# Patient Record
Sex: Male | Born: 1970
Health system: Southern US, Community
[De-identification: ages and names within clinical notes are randomized; demographics above are authoritative.]

## PROBLEM LIST (undated history)

## (undated) DIAGNOSIS — I509 Heart failure, unspecified: Secondary | ICD-10-CM

## (undated) DIAGNOSIS — E119 Type 2 diabetes mellitus without complications: Secondary | ICD-10-CM

---

## 1997-11-30 ENCOUNTER — Encounter: Admission: RE | Admit: 1997-11-30 | Discharge: 1997-11-30 | Payer: Self-pay | Admitting: *Deleted

## 2005-11-05 ENCOUNTER — Emergency Department (HOSPITAL_COMMUNITY): Admission: EM | Admit: 2005-11-05 | Discharge: 2005-11-05 | Payer: Self-pay | Admitting: Emergency Medicine

## 2016-11-06 ENCOUNTER — Inpatient Hospital Stay (HOSPITAL_COMMUNITY)
Admission: EM | Admit: 2016-11-06 | Discharge: 2016-11-16 | DRG: 287 | Disposition: A | Discharge status: Home / Self Care | Payer: Medicaid Other | Attending: Internal Medicine | Admitting: Internal Medicine

## 2016-11-06 ENCOUNTER — Emergency Department (HOSPITAL_COMMUNITY): Payer: Medicaid Other

## 2016-11-06 ENCOUNTER — Encounter (HOSPITAL_COMMUNITY): Payer: Self-pay | Admitting: Internal Medicine

## 2016-11-06 DIAGNOSIS — I240 Acute coronary thrombosis not resulting in myocardial infarction: Secondary | ICD-10-CM | POA: Diagnosis present

## 2016-11-06 DIAGNOSIS — E1165 Type 2 diabetes mellitus with hyperglycemia: Secondary | ICD-10-CM | POA: Diagnosis present

## 2016-11-06 DIAGNOSIS — Z8249 Family history of ischemic heart disease and other diseases of the circulatory system: Secondary | ICD-10-CM

## 2016-11-06 DIAGNOSIS — N179 Acute kidney failure, unspecified: Secondary | ICD-10-CM | POA: Diagnosis present

## 2016-11-06 DIAGNOSIS — R609 Edema, unspecified: Secondary | ICD-10-CM

## 2016-11-06 DIAGNOSIS — E039 Hypothyroidism, unspecified: Secondary | ICD-10-CM | POA: Diagnosis present

## 2016-11-06 DIAGNOSIS — E1122 Type 2 diabetes mellitus with diabetic chronic kidney disease: Secondary | ICD-10-CM | POA: Diagnosis present

## 2016-11-06 DIAGNOSIS — F17290 Nicotine dependence, other tobacco product, uncomplicated: Secondary | ICD-10-CM | POA: Diagnosis present

## 2016-11-06 DIAGNOSIS — N182 Chronic kidney disease, stage 2 (mild): Secondary | ICD-10-CM | POA: Diagnosis present

## 2016-11-06 DIAGNOSIS — E876 Hypokalemia: Secondary | ICD-10-CM | POA: Diagnosis not present

## 2016-11-06 DIAGNOSIS — R0602 Shortness of breath: Secondary | ICD-10-CM | POA: Diagnosis present

## 2016-11-06 DIAGNOSIS — I5023 Acute on chronic systolic (congestive) heart failure: Secondary | ICD-10-CM

## 2016-11-06 DIAGNOSIS — I5021 Acute systolic (congestive) heart failure: Principal | ICD-10-CM | POA: Diagnosis present

## 2016-11-06 DIAGNOSIS — I514 Myocarditis, unspecified: Secondary | ICD-10-CM | POA: Diagnosis present

## 2016-11-06 DIAGNOSIS — T380X5A Adverse effect of glucocorticoids and synthetic analogues, initial encounter: Secondary | ICD-10-CM | POA: Diagnosis not present

## 2016-11-06 DIAGNOSIS — I509 Heart failure, unspecified: Secondary | ICD-10-CM

## 2016-11-06 DIAGNOSIS — I5020 Unspecified systolic (congestive) heart failure: Secondary | ICD-10-CM

## 2016-11-06 LAB — BRAIN NATRIURETIC PEPTIDE: B Natriuretic Peptide: 961.6 pg/mL — ABNORMAL HIGH (ref 0.0–100.0)

## 2016-11-06 LAB — I-STAT TROPONIN, ED: Troponin i, poc: 0.04 ng/mL (ref 0.00–0.08)

## 2016-11-06 LAB — CBC
HCT: 41.2 % (ref 39.0–52.0)
Hemoglobin: 13.9 g/dL (ref 13.0–17.0)
MCH: 29.1 pg (ref 26.0–34.0)
MCHC: 33.7 g/dL (ref 30.0–36.0)
MCV: 86.4 fL (ref 78.0–100.0)
Platelets: 369 10*3/uL (ref 150–400)
RBC: 4.77 MIL/uL (ref 4.22–5.81)
RDW: 15.5 % (ref 11.5–15.5)
WBC: 16 10*3/uL — ABNORMAL HIGH (ref 4.0–10.5)

## 2016-11-06 LAB — BASIC METABOLIC PANEL
Anion gap: 9 (ref 5–15)
BUN: 37 mg/dL — ABNORMAL HIGH (ref 6–20)
CO2: 21 mmol/L — ABNORMAL LOW (ref 22–32)
Calcium: 8 mg/dL — ABNORMAL LOW (ref 8.9–10.3)
Chloride: 100 mmol/L — ABNORMAL LOW (ref 101–111)
Creatinine, Ser: 1.43 mg/dL — ABNORMAL HIGH (ref 0.61–1.24)
GFR calc Af Amer: >60 mL/min (ref >60)
GFR calc non Af Amer: 57 mL/min — ABNORMAL LOW (ref >60)
Glucose, Bld: 158 mg/dL — ABNORMAL HIGH (ref 65–99)
Potassium: 4.8 mmol/L (ref 3.5–5.1)
Sodium: 130 mmol/L — ABNORMAL LOW (ref 135–145)

## 2016-11-06 LAB — TSH: TSH: 8.439 u[IU]/mL — ABNORMAL HIGH (ref 0.350–4.500)

## 2016-11-06 MED ORDER — SODIUM CHLORIDE 0.9 % IV SOLN: 250.0000 mL | INTRAVENOUS | Status: DC | PRN

## 2016-11-06 MED ORDER — DEXTROSE 5 % IV SOLN
1.0000 g | Freq: Once | INTRAVENOUS | Status: AC
  Administered 2016-11-06: 1 g via INTRAVENOUS
  Filled 2016-11-06: qty 10

## 2016-11-06 MED ORDER — ASPIRIN EC 81 MG PO TBEC
81.0000 mg | DELAYED_RELEASE_TABLET | Freq: Every day | ORAL | Status: DC
  Administered 2016-11-06 – 2016-11-16 (×11): 81 mg via ORAL
  Filled 2016-11-06 (×12): qty 1

## 2016-11-06 MED ORDER — SODIUM CHLORIDE 0.9% FLUSH: 3.0000 mL | INTRAVENOUS | Status: DC | PRN

## 2016-11-06 MED ORDER — DEXTROSE 5 % IV SOLN
500.0000 mg | Freq: Once | INTRAVENOUS | Status: AC
  Administered 2016-11-06: 500 mg via INTRAVENOUS
  Filled 2016-11-06: qty 500

## 2016-11-06 MED ORDER — FUROSEMIDE 10 MG/ML IJ SOLN
40.0000 mg | Freq: Once | INTRAMUSCULAR | Status: AC
  Administered 2016-11-06: 40 mg via INTRAVENOUS
  Filled 2016-11-06: qty 4

## 2016-11-06 MED ORDER — ACETAMINOPHEN 325 MG PO TABS: 650.0000 mg | ORAL_TABLET | ORAL | Status: DC | PRN

## 2016-11-06 MED ORDER — ONDANSETRON HCL 4 MG/2ML IJ SOLN: 4.0000 mg | Freq: Four times a day (QID) | INTRAMUSCULAR | Status: DC | PRN

## 2016-11-06 MED ORDER — HEPARIN SODIUM (PORCINE) 5000 UNIT/ML IJ SOLN
5000.0000 [IU] | Freq: Three times a day (TID) | INTRAMUSCULAR | Status: DC
  Administered 2016-11-06 – 2016-11-07 (×2): 5000 [IU] via SUBCUTANEOUS
  Filled 2016-11-06 (×2): qty 1

## 2016-11-06 MED ORDER — ALBUTEROL SULFATE (2.5 MG/3ML) 0.083% IN NEBU
2.5000 mg | INHALATION_SOLUTION | Freq: Once | RESPIRATORY_TRACT | Status: AC
  Administered 2016-11-06: 2.5 mg via RESPIRATORY_TRACT
  Filled 2016-11-06: qty 3

## 2016-11-06 MED ORDER — SODIUM CHLORIDE 0.9% FLUSH
3.0000 mL | Freq: Two times a day (BID) | INTRAVENOUS | Status: DC
  Administered 2016-11-06 – 2016-11-11 (×5): 3 mL via INTRAVENOUS

## 2016-11-06 NOTE — ED Notes (Signed)
Nurse collecting labs. 

## 2016-11-06 NOTE — ED Notes (Signed)
Talked to admitting about pt rr and how pt was doing. Admitting talk to EDP about what the plan was.

## 2016-11-06 NOTE — ED Notes (Signed)
Ambulated pt on the unit pt ambulated well pt o2 sats 91% HR-125 no complaints noted at this time

## 2016-11-06 NOTE — ED Notes (Signed)
Patient transported to X-ray 

## 2016-11-06 NOTE — Consult Note (Signed)
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   (Not in a hospital admission)   . furosemide  40 mg Intravenous Once    Infusions: . azithromycin    . cefTRIAXone (ROCEPHIN)  IV      Allergies not on file  Social History   Social History  . Marital status: Unknown    Spouse name: N/A  . Number of children: N/A  . Years of education: N/A   Occupational History  . Not on file.   Social History Main Topics  . Smoking status: Not on file  . Smokeless tobacco: Not on file  . Alcohol use Not on file  . Drug use: Unknown  . Sexual activity: Not on file   Other Topics Concern  . Not on file   Social History Narrative  . No narrative on file    No family history on file.  REVIEW OF SYSTEMS:  All systems reviewed  Negative to the above problem except as noted above.    PHYSICAL EXAM: Vitals:   11/06/16 1615 11/06/16 1621  BP: (!) 119/102   Resp: (!) 29 (!) 30  Temp:  98.2 F (36.8 C)  SpO2:  100%    No intake or output data in the 24 hours ending 11/06/16 1741  General:  Well appearing. No respiratory difficulty HEENT: normal  Sclera are icteriic Neck: supple. JVP is increased  . Carotids 2+ bilat; no bruits. No lymphadenopathy or thryomegaly appreciated. Cor: PMI nondisplaced. Regular rate & rhythm.  S1,S2  S3  PMI diffuse  RV impulse No singificant murmurs  Lungs: Rales at bases R greater than L   Abdomen: soft, nontender, nondistended. No hepatosplenomegaly. No bruits or masses. Good  bowel sounds. Extremities: no cyanosis, clubbing, rash, 2-3+ edema with weeping  Neuro: alert & oriented x 3, cranial nerves grossly intact. moves all 4 extremities w/o difficulty. Affect pleasant.  ECG:  Sinus tachycardia  117 bpm  NOnspecific ST  T wave changes    Results for orders placed or performed during the hospital encounter of 11/06/16 (from the past 24 hour(s))  Basic metabolic panel     Status: Abnormal   Collection Time: 11/06/16  4:15 PM  Result Value Ref Range   Sodium 130 (L) 135 - 145 mmol/L   Potassium 4.8 3.5 - 5.1 mmol/L   Chloride 100 (L) 101 - 111 mmol/L   CO2 21 (L) 22 - 32 mmol/L   Glucose, Bld 158 (H) 65 - 99 mg/dL   BUN 37 (H) 6 - 20 mg/dL   Creatinine, Ser 1.611.43 (H) 0.61 - 1.24 mg/dL   Calcium 8.0 (L) 8.9 - 10.3 mg/dL   GFR calc non Af Amer 57 (L) >60 mL/min   GFR calc Af Amer >60 >60 mL/min   Anion gap 9 5 - 15  CBC     Status: Abnormal  Collection Time: 11/06/16  4:15 PM  Result Value Ref Range   WBC 16.0 (H) 4.0 - 10.5 K/uL   RBC 4.77 4.22 - 5.81 MIL/uL   Hemoglobin 13.9 13.0 - 17.0 g/dL   HCT 54.6 27.0 - 35.0 %   MCV 86.4 78.0 - 100.0 fL   MCH 29.1 26.0 - 34.0 pg   MCHC 33.7 30.0 - 36.0 g/dL   RDW 09.3 81.8 - 29.9 %   Platelets 369 150 - 400 K/uL  Brain natriuretic peptide     Status: Abnormal   Collection Time: 11/06/16  4:21 PM  Result Value Ref Range   B Natriuretic Peptide 961.6 (H) 0.0 - 100.0 pg/mL  I-stat troponin, ED     Status: None   Collection Time: 11/06/16  4:24 PM  Result Value Ref Range   Troponin i, poc 0.04 0.00 - 0.08 ng/mL   Comment 3           Dg Chest 2 View  Result Date: 11/06/2016 CLINICAL DATA:  Shortness of breath. EXAM: CHEST  2 VIEW COMPARISON:  None. FINDINGS: Cardiomegaly. Normal pulmonary vascularity. Right middle and lower lobe consolidation. Small right pleural effusion. The left lung is clear. No pneumothorax. No acute osseous abnormality. IMPRESSION: Right middle and lower lobe consolidation, suspicious for  pneumonia. Small right pleural effusion. Followup PA and lateral chest X-ray is recommended in 3-4 weeks following trial of antibiotic therapy to ensure resolution and exclude underlying malignancy. The Electronically Signed   By: Obie Dredge M.D.   On: 11/06/2016 16:58     ASSESSMENT: 46 yo with no prior medical history  For 3 weeks developed increased SOB with then development of LE edema   Felt like he never got better after he got a cold Today presents with CHF  CXR with cardiomegaly  ? Pneumonia. Bedside echo with severe LV and RV dysfunction LVEF approx 15% as well as RVEF  Plan:   Admit  Check CMET ChecK TSH, HIV Lasix 80 x 1  Consider PICC liine for milrinone if does not respond  Hold b blocker now  Hold ACE ARB until determine response to lasix  Follow Cr   Full echo in AM  EMperic antibiotics for pneumonia  Follow clinically with CXR as well Low NA diet with fluid restrict Counselled on tobacco and ETOH    Dietrich Pates

## 2016-11-06 NOTE — ED Notes (Signed)
Turkey sandwich and ginger ale given 

## 2016-11-06 NOTE — ED Provider Notes (Signed)
[facility] EMERGENCY DEPARTMENT Provider Note   CSN: [id number] Arrival date & time: 11/06/16  1550     History   Chief Complaint Chief Complaint  Patient presents with  . Shortness of Breath  . Leg Swelling    HPI Benjamin Sherman is a 46 y.o. male. Chief complaint is shortness of breath, dyspnea on exertion, and bilateral lower leg swelling and weeping.  HPI: 46 year old male with no reported medical history. He has not seen a doctor for 21 years. Since Thursday, 4 days ago he has noticed progressive swelling of his legs. His nose progressive shortness of breath laying supine in particular with any exertion. No chest pain at rest or with exertion. He had a "cold" first of this month about 3 weeks ago with runny nose and cough. No chest pain. No fever.  Dry nonproductive cough. No sputum. No hemoptysis.  No past medical history on file.  There are no active problems to display for this patient.   No past surgical history on file.     Home Medications    Prior to Admission medications   Medication Sig Start Date End Date Taking? Authorizing Provider  ibuprofen (ADVIL,MOTRIN) 200 MG tablet Take 400 mg by mouth every 6 (six) hours as needed for mild pain.   Yes [provider]    Family History Family History  Problem Relation Age of Onset  . CAD Father     Social History Social History  Substance Use Topics  . Smoking status: Current Some Day Smoker    Types: Cigars  . Smokeless tobacco: Not on file     Comment: 6 cigars per wk  . Alcohol use 8.4 oz/week    12 Cans of beer, 2 Shots of liquor per week     Allergies   Patient has no known allergies.   Review of Systems Review of Systems  Constitutional: Negative for appetite change, chills, diaphoresis, fatigue and fever.  HENT: Negative for mouth sores, sore throat and trouble swallowing.   Eyes: Negative for visual disturbance.  Respiratory: Positive for cough and shortness  of breath. Negative for chest tightness and wheezing.   Cardiovascular: Positive for leg swelling. Negative for chest pain.       Orthopnea, dyspnea on exertion.  Gastrointestinal: Negative for abdominal distention, abdominal pain, diarrhea, nausea and vomiting.  Endocrine: Negative for polydipsia, polyphagia and polyuria.  Genitourinary: Negative for dysuria, frequency and hematuria.  Musculoskeletal: Negative for gait problem.  Skin: Negative for color change, pallor and rash.  Neurological: Negative for dizziness, syncope, light-headedness and headaches.  Hematological: Does not bruise/bleed easily.  Psychiatric/Behavioral: Negative for behavioral problems and confusion.     Physical Exam Updated Vital Signs BP (!) 118/96   Temp 98.2 F (36.8 C)   Resp (!) 23   Ht 6' (1.829 m)   Wt 112 kg (247 lb)   SpO2 100%   BMI 33.50 kg/m   Physical Exam  Constitutional: He is oriented to person, place, and time. He appears well-developed and well-nourished. No distress.  Large stature adult male. Not dyspneic with conversation.  HENT:  Head: Normocephalic.  Eyes: Pupils are equal, round, and reactive to light. Conjunctivae are normal. No scleral icterus.  Neck: Normal range of motion. Neck supple. No thyromegaly present.  Cardiovascular: Normal rate and regular rhythm.  Exam reveals no gallop and no friction rub.   No murmur heard. Sinus tachycardia. Rate 110. S3 gallop. JVD sitting upright.  Pulmonary/Chest: Effort normal and  breath sounds normal. No respiratory distress. He has no wheezes. He has no rales.  Bibasilar crackles. Not tachypneic.  Abdominal: Soft. Bowel sounds are normal. He exhibits no distension. There is no tenderness. There is no rebound.  Musculoskeletal: Normal range of motion.  Neurological: He is alert and oriented to person, place, and time.  Skin: Skin is warm and dry. No rash noted.  2+ to 3+ symmetric lower extremity edema with weeping. No cellulitis.    Psychiatric: He has a normal mood and affect. His behavior is normal.     ED Treatments / Results  Labs (all labs ordered are listed, but only abnormal results are displayed) Labs Reviewed  BASIC METABOLIC PANEL - Abnormal; Notable for the following:       Result Value   Sodium 130 (*)    Chloride 100 (*)    CO2 21 (*)    Glucose, Bld 158 (*)    BUN 37 (*)    Creatinine, Ser 1.43 (*)    Calcium 8.0 (*)    GFR calc non Af Amer 57 (*)    All other components within normal limits  CBC - Abnormal; Notable for the following:    WBC 16.0 (*)    All other components within normal limits  BRAIN NATRIURETIC PEPTIDE - Abnormal; Notable for the following:    B Natriuretic Peptide 961.6 (*)    All other components within normal limits  CULTURE, BLOOD (ROUTINE X 2)  CULTURE, BLOOD (ROUTINE X 2)  I-STAT TROPONIN, ED    EKG  EKG Interpretation None       Radiology Dg Chest 2 View  Result Date: 11/06/2016 CLINICAL DATA:  Shortness of breath. EXAM: CHEST  2 VIEW COMPARISON:  None. FINDINGS: Cardiomegaly. Normal pulmonary vascularity. Right middle and lower lobe consolidation. Small right pleural effusion. The left lung is clear. No pneumothorax. No acute osseous abnormality. IMPRESSION: Right middle and lower lobe consolidation, suspicious for pneumonia. Small right pleural effusion. Followup PA and lateral chest X-ray is recommended in 3-4 weeks following trial of antibiotic therapy to ensure resolution and exclude underlying malignancy. The Electronically Signed   By: Obie DredgeWilliam T Derry M.D.   On: 11/06/2016 16:58    Procedures Procedures (including critical care time)  Medications Ordered in ED Medications  azithromycin (ZITHROMAX) 500 mg in dextrose 5 % 250 mL IVPB (500 mg Intravenous New Bag/Given 11/06/16 1858)  furosemide (LASIX) injection 40 mg (40 mg Intravenous Given 11/06/16 1805)  cefTRIAXone (ROCEPHIN) 1 g in dextrose 5 % 50 mL IVPB (0 g Intravenous Stopped 11/06/16  1851)  furosemide (LASIX) injection 40 mg (40 mg Intravenous Given 11/06/16 1854)     Initial Impression / Assessment and Plan / ED Course  I have reviewed the triage vital signs and the nursing notes.  Pertinent labs & imaging results that were available during my care of the patient were reviewed by me and considered in my medical decision making (see chart for details).    BP 119/92. Clinically in congestive heart failure with JVD, gallop, dependent edema, crackles, sinus tachycardia. Rule out MI. Troponin pending. EKG shows no Q waves. No ischemia. Afebrile. Differential diagnosis would include idoopathic myopathy, ischemic myopathy although no pain, viral myocarditis. Hypertensive myopathy-although pt not hypertensive now. Await labs and imaging.  Limited bedside ultrasound by myself does not show effusion. Concern was for possible tympanostomy with relative hypotension/normotension, JVD, CHF.  Chest x-ray shows asymmetry and possible right pneumonia. Cultures were obtained. Given IV antibiotics. Dr. Tenny Crawoss  of cardiology is here planning admission. Patient given 40 of Lasix by myself.  Final Clinical Impressions(s) / ED Diagnoses   Final diagnoses:  Systolic congestive heart failure, unspecified HF chronicity (HCC)    New Prescriptions New Prescriptions   No medications on file     Rolland Porter, MD 11/06/16 1920

## 2016-11-07 ENCOUNTER — Encounter (HOSPITAL_COMMUNITY): Payer: Self-pay

## 2016-11-07 ENCOUNTER — Inpatient Hospital Stay (HOSPITAL_COMMUNITY): Payer: Medicaid Other

## 2016-11-07 ENCOUNTER — Inpatient Hospital Stay (HOSPITAL_COMMUNITY): Payer: Self-pay

## 2016-11-07 DIAGNOSIS — I340 Nonrheumatic mitral (valve) insufficiency: Secondary | ICD-10-CM

## 2016-11-07 DIAGNOSIS — N179 Acute kidney failure, unspecified: Secondary | ICD-10-CM

## 2016-11-07 DIAGNOSIS — I5021 Acute systolic (congestive) heart failure: Principal | ICD-10-CM

## 2016-11-07 LAB — COMPREHENSIVE METABOLIC PANEL
ALT: 32 U/L (ref 17–63)
AST: 76 U/L — ABNORMAL HIGH (ref 15–41)
Albumin: 1.4 g/dL — ABNORMAL LOW (ref 3.5–5.0)
Alkaline Phosphatase: 161 U/L — ABNORMAL HIGH (ref 38–126)
Anion gap: 11 (ref 5–15)
BUN: 44 mg/dL — ABNORMAL HIGH (ref 6–20)
CO2: 22 mmol/L (ref 22–32)
Calcium: 7.8 mg/dL — ABNORMAL LOW (ref 8.9–10.3)
Chloride: 100 mmol/L — ABNORMAL LOW (ref 101–111)
Creatinine, Ser: 1.82 mg/dL — ABNORMAL HIGH (ref 0.61–1.24)
GFR calc Af Amer: 49 mL/min — ABNORMAL LOW (ref >60)
GFR calc non Af Amer: 42 mL/min — ABNORMAL LOW (ref >60)
Glucose, Bld: 180 mg/dL — ABNORMAL HIGH (ref 65–99)
Potassium: 4.7 mmol/L (ref 3.5–5.1)
Sodium: 133 mmol/L — ABNORMAL LOW (ref 135–145)
Total Bilirubin: 2.7 mg/dL — ABNORMAL HIGH (ref 0.3–1.2)
Total Protein: 7.5 g/dL (ref 6.5–8.1)

## 2016-11-07 LAB — COOXEMETRY PANEL
Carboxyhemoglobin: 1 % (ref 0.5–1.5)
Methemoglobin: 1 % (ref 0.0–1.5)
O2 Saturation: 52.4 %
Total hemoglobin: 13.5 g/dL (ref 12.0–16.0)

## 2016-11-07 LAB — ECHOCARDIOGRAM COMPLETE
Height: 72 in
Weight: 4124.8 oz

## 2016-11-07 LAB — T4, FREE: Free T4: 0.84 ng/dL (ref 0.61–1.12)

## 2016-11-07 LAB — CBC
HCT: 39.6 % (ref 39.0–52.0)
Hemoglobin: 13.1 g/dL (ref 13.0–17.0)
MCH: 28.4 pg (ref 26.0–34.0)
MCHC: 33.1 g/dL (ref 30.0–36.0)
MCV: 85.9 fL (ref 78.0–100.0)
Platelets: 344 10*3/uL (ref 150–400)
RBC: 4.61 MIL/uL (ref 4.22–5.81)
RDW: 16 % — ABNORMAL HIGH (ref 11.5–15.5)
WBC: 15.2 10*3/uL — ABNORMAL HIGH (ref 4.0–10.5)

## 2016-11-07 LAB — HEPARIN LEVEL (UNFRACTIONATED): Heparin Unfractionated: 0.12 IU/mL — ABNORMAL LOW (ref 0.30–0.70)

## 2016-11-07 MED ORDER — DIGOXIN 125 MCG PO TABS
0.1250 mg | ORAL_TABLET | Freq: Every day | ORAL | Status: DC
  Administered 2016-11-07 – 2016-11-16 (×10): 0.125 mg via ORAL
  Filled 2016-11-07 (×10): qty 1

## 2016-11-07 MED ORDER — HYDRALAZINE HCL 25 MG PO TABS
25.0000 mg | ORAL_TABLET | Freq: Three times a day (TID) | ORAL | Status: DC
  Administered 2016-11-07 – 2016-11-08 (×2): 25 mg via ORAL
  Filled 2016-11-07 (×2): qty 1

## 2016-11-07 MED ORDER — HEPARIN (PORCINE) IN NACL 100-0.45 UNIT/ML-% IJ SOLN
1800.0000 [IU]/h | INTRAMUSCULAR | Status: DC
  Administered 2016-11-08: 1750 [IU]/h via INTRAVENOUS
  Administered 2016-11-09 (×2): 1800 [IU]/h via INTRAVENOUS
  Filled 2016-11-07 (×4): qty 250

## 2016-11-07 MED ORDER — SODIUM CHLORIDE 0.9% FLUSH
10.0000 mL | INTRAVENOUS | Status: DC | PRN
  Administered 2016-11-08: 10 mL
  Administered 2016-11-09 – 2016-11-10 (×2): 20 mL
  Administered 2016-11-12 – 2016-11-13 (×3): 10 mL
  Filled 2016-11-07 (×6): qty 40

## 2016-11-07 MED ORDER — ISOSORBIDE MONONITRATE ER 30 MG PO TB24
15.0000 mg | ORAL_TABLET | Freq: Every day | ORAL | Status: DC
  Administered 2016-11-07 – 2016-11-08 (×2): 15 mg via ORAL
  Filled 2016-11-07 (×2): qty 1

## 2016-11-07 MED ORDER — FUROSEMIDE 10 MG/ML IJ SOLN
80.0000 mg | Freq: Two times a day (BID) | INTRAMUSCULAR | Status: DC
  Administered 2016-11-07 – 2016-11-10 (×8): 80 mg via INTRAVENOUS
  Filled 2016-11-07 (×8): qty 8

## 2016-11-07 MED ORDER — HEPARIN (PORCINE) IN NACL 100-0.45 UNIT/ML-% IJ SOLN
1500.0000 [IU]/h | INTRAMUSCULAR | Status: DC
  Administered 2016-11-07 (×2): 1500 [IU]/h via INTRAVENOUS
  Filled 2016-11-07 (×2): qty 250

## 2016-11-07 MED ORDER — HYDRALAZINE HCL 25 MG PO TABS
12.5000 mg | ORAL_TABLET | Freq: Three times a day (TID) | ORAL | Status: DC
  Administered 2016-11-07: 12.5 mg via ORAL
  Filled 2016-11-07: qty 1

## 2016-11-07 MED ORDER — PERFLUTREN LIPID MICROSPHERE
1.0000 mL | INTRAVENOUS | Status: AC | PRN
  Administered 2016-11-07: 8 mL via INTRAVENOUS
  Filled 2016-11-07: qty 10

## 2016-11-07 MED ORDER — SODIUM CHLORIDE 0.9% FLUSH
10.0000 mL | Freq: Two times a day (BID) | INTRAVENOUS | Status: DC
Start: 2016-11-07 — End: 2016-11-16
  Administered 2016-11-08 – 2016-11-13 (×6): 10 mL

## 2016-11-07 NOTE — Progress Notes (Signed)
Pt transferred via bed with belongings to unit 4 [address], room [phone], escorted by unit RN and NT.

## 2016-11-07 NOTE — Progress Notes (Signed)
Walked this PM, standby assist. Room air. Approx 200 ft. Tolerated well, will continue to monitor.  Margarito Liner, RN

## 2016-11-07 NOTE — Progress Notes (Signed)
Peripherally Inserted Central Catheter/Midline Placement  The IV Nurse has discussed with the patient and/or persons authorized to consent for the patient, the purpose of this procedure and the potential benefits and risks involved with this procedure.  The benefits include less needle sticks, lab draws from the catheter, and the patient may be discharged home with the catheter. Risks include, but not limited to, infection, bleeding, blood clot (thrombus formation), and puncture of an artery; nerve damage and irregular heartbeat and possibility to perform a PICC exchange if needed/ordered by physician.  Alternatives to this procedure were also discussed.  Bard Power PICC patient education guide, fact sheet on infection prevention and patient information card has been provided to patient /or left at bedside.    PICC/Midline Placement Documentation  PICC Triple Lumen 11/07/16 PICC Left Basilic 51 cm 1 cm (Active)  Indication for Insertion or Continuance of Line Vasoactive infusions 11/07/2016  2:00 PM  Exposed Catheter (cm) 1 cm 11/07/2016  2:00 PM  Site Assessment Clean;Dry;Intact 11/07/2016  2:00 PM  Lumen #1 Status Flushed;Blood return noted 11/07/2016  2:00 PM  Lumen #2 Status Flushed;Blood return noted 11/07/2016  2:00 PM  Lumen #3 Status Flushed;Blood return noted 11/07/2016  2:00 PM  Dressing Type Transparent 11/07/2016  2:00 PM  Dressing Status Clean;Dry;Intact;Antimicrobial disc in place 11/07/2016  2:00 PM  Dressing Intervention New dressing 11/07/2016  2:00 PM  Dressing Change Due 11/14/16 11/07/2016  2:00 PM       Stacie Glaze Horton 11/07/2016, 2:22 PM

## 2016-11-07 NOTE — Progress Notes (Addendum)
Progress Note  Patient Name: Benjamin HorsemanSteven Sherman Date of Encounter: 11/07/2016  Primary Cardiologist: New   Subjective   No CP  Breathing is better    Inpatient Medications    Scheduled Meds: . aspirin EC  81 mg Oral Daily  . heparin  5,000 Units Subcutaneous Q8H  . sodium chloride flush  3 mL Intravenous Q12H   Continuous Infusions: . sodium chloride     PRN Meds: sodium chloride, acetaminophen, ondansetron (ZOFRAN) IV, sodium chloride flush   Vital Signs    Vitals:   11/07/16 0700 11/07/16 0730 11/07/16 0800 11/07/16 0815  BP: 111/87 112/89 108/89 (!) 118/98  Pulse: (!) 112 (!) 111 (!) 111 (!) 115  Resp: (!) 23 15 14 19   Temp:      SpO2: 100% 100% 99% 100%  Weight:      Height:        Intake/Output Summary (Last 24 hours) at 11/07/16 0836 Last data filed at 11/07/16 0805  Gross per 24 hour  Intake              250 ml  Output             1350 ml  Net            -1100 ml   Filed Weights   11/06/16 1608  Weight: 247 lb (112 kg)    Telemetry    ST   - Personally Reviewed  ECG      Physical Exam   GEN: No acute distress.   Neck: JVP is increase   Cardiac: RRR, no murmurs, rubs, or gallops.  Respiratory: Rales at L base  GI: Soft, nontender, non-distended  MS: 2+ edema; No deformity. Neuro:  Nonfocal  Psych: Normal affect   Labs    Chemistry Recent Labs Lab 11/06/16 1615 11/07/16 0530  NA 130* 133*  K 4.8 4.7  CL 100* 100*  CO2 21* 22  GLUCOSE 158* 180*  BUN 37* 44*  CREATININE 1.43* 1.82*  CALCIUM 8.0* 7.8*  PROT  --  7.5  ALBUMIN  --  1.4*  AST  --  76*  ALT  --  32  ALKPHOS  --  161*  BILITOT  --  2.7*  GFRNONAA 57* 42*  GFRAA >60 49*  ANIONGAP 9 11     Hematology Recent Labs Lab 11/06/16 1615  WBC 16.0*  RBC 4.77  HGB 13.9  HCT 41.2  MCV 86.4  MCH 29.1  MCHC 33.7  RDW 15.5  PLT 369    Cardiac EnzymesNo results for input(s): TROPONINI in the last 168 hours.  Recent Labs Lab 11/06/16 1624  TROPIPOC 0.04      BNP Recent Labs Lab 11/06/16 1621  BNP 961.6*     DDimer No results for input(s): DDIMER in the last 168 hours.   Radiology    Dg Chest 2 View  Result Date: 11/06/2016 CLINICAL DATA:  Shortness of breath. EXAM: CHEST  2 VIEW COMPARISON:  None. FINDINGS: Cardiomegaly. Normal pulmonary vascularity. Right middle and lower lobe consolidation. Small right pleural effusion. The left lung is clear. No pneumothorax. No acute osseous abnormality. IMPRESSION: Right middle and lower lobe consolidation, suspicious for pneumonia. Small right pleural effusion. Followup PA and lateral chest X-ray is recommended in 3-4 weeks following trial of antibiotic therapy to ensure resolution and exclude underlying malignancy. The Electronically Signed   By: Obie DredgeWilliam T Derry M.D.   On: 11/06/2016 16:58    Cardiac Studies   Echo pending  Patient Profile     46 y.o. male presents with SOB and edema    Assessment & Plan    1  Acute systolic CHF (RV and LV) Remains tachycardic  BP is OK    Pt is more comfortable than yesterday but still with profound volume overload  He has diuresed about 1.5 L so far.   Still edematous Cr increased to 1.8 (was 1.4 yesterday) I have discussed with  D Bensimhon  CHF service to take over care   Will hold on lasix dosing this am until they have seen pt  Consider inotropic support   Echo today    2  ID   CXR susp for pneumonia.   CLinically though not behaving like this   WIll repeat CBC   I would hold on further ABX (got 1 dose) until clarifies  REpeat CXR when diuresed  3  ENdo  Elevated TSH  WIll check free Free T3, free T4   Pt still waiting for telemetry bed    For questions or updates, please contact [facility] Please consult [email or web address] for contact info under Cardiology/STEMI.      Signed, Dietrich Pates, MD  11/07/2016, 8:36 AM

## 2016-11-07 NOTE — Progress Notes (Addendum)
  PICC line placed  Initial CVP 17 Initial Coox 52.4%  Low threshold to start milrinone, but at this point he is feeling better and has good urine output.  (~200 cc an hour)  Continue current meds for now, will discuss with MD with borderline/low cardiac output, and may start milrinone later this evening.   Casimiro Needle [address]" [place], PA-C 11/07/2016 3:48 PM   Discussed with Dr. Gala Romney. Will continue current meds tomorrow.  Will increase hydralazine to 25 mg TID as tolerated.    Casimiro Needle [address]" [place], PA-C 11/07/2016 4:05 PM

## 2016-11-07 NOTE — Progress Notes (Signed)
ANTICOAGULATION CONSULT NOTE   Pharmacy Consult for heparin Indication: Lv Thrombus  No Known Allergies  Patient Measurements: Height: 6' (182.9 cm) Weight: 257 lb 12.8 oz (116.9 kg) IBW/kg (Calculated) : 77.6 Heparin Dosing Weight: 103 kg  Vital Signs: Temp: 98.3 F (36.8 C) (10/23 1232) Temp Source: Oral (10/23 1232) BP: 113/87 (10/23 1516) Pulse Rate: 115 (10/23 1232)  Labs:  Recent Labs  11/06/16 1615 11/07/16 0530 11/07/16 0931 11/07/16 1810  HGB 13.9  --  13.1  --   HCT 41.2  --  39.6  --   PLT 369  --  344  --   HEPARINUNFRC  --   --   --  0.12*  CREATININE 1.43* 1.82*  --   --     Estimated Creatinine Clearance: 65.5 mL/min (A) (by C-G formula based on SCr of 1.82 mg/dL (H)).   Medical History: No past medical history on file.   Assessment: 27 yoM admitted with ADHF found to have likely LV thrombus on preliminary ECHO to start on IV heparin. No OAC noted PTA but pt received SQ heparin this morning. CBC stable.  Initial heparin level low on 1500 units/hr.  No overt bleeding or complications noted.  Heparin now running through PICC line.  Goal of Therapy:  Heparin level 0.3-0.7 units/ml Monitor platelets by anticoagulation protocol: Yes   Plan:  -Increase IV heparin to 1750 units/hr. -Check 6-hr heparin level -Monitor heparin level, CBC, S/Sx bleeding daily  Tad Moore, BCPS  Clinical Pharmacist Pager [phone]  11/07/2016 7:03 PM

## 2016-11-07 NOTE — Plan of Care (Signed)
Problem: Education: Goal: Ability to demonstrate managment of disease process will improve Outcome: Progressing Discussed w/ pt importance of daily weights, fluid restriction, and diet.

## 2016-11-07 NOTE — Consult Note (Signed)
Advanced Heart Failure Team Consult Note  Primary Cardiologist:  New (Dr. Tenny Crawoss) HF: New (Dr. Gala RomneyBensimhon)  Reason for Consultation: Acute systolic CHF.   HPI:    Benjamin Sherman is seen today for evaluation of acute systolic CHF at the request of Dr. Tenny Crawoss.   Benjamin Sherman is a 46 y.o. male with no previous medical history. He has had worsening SOB and LE edema for 3 weeks. He feels like he got a cold around this time and has felt worse, not better, since.   Pertinent labs on admission include CR 1.43, K 4.8, WBC 16.0, Hgb 13.9, and BNP 961.6. BCx pending.   CXR 11/06/16 Right middle and Lower Lobe consolidation suspicious for PNA with small R pleural effusion.   Bedside echo 11/06/16 with approximate EF 20%. (RV reduced as well). Probable LV apical clot.   Pt negative ~ 300 cc yesterday with dose of IV lasix. Negative 800 cc this am.  Weight shows *up* 10 lbs, but transferred floors.  Pt states was in his USOH up until the beginning of October. Caught a cold from "someone at work" and has felt gradually worse since. 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 He denies CP, lightheadedness or dizziness. No syncope. Has had SOB and LE edema worse over the past week. He smokes several cigars on the weekends, and usually drinks a 12 pack, with occasional liquor on the weekends. He does not drink or smoke on the weeknights due to his job.   Pt states his father had 4 "heart attacks" before the age of 48. He also had several aunts on his fathers side that had "heart attacks" in their younger 7030s. He is unsure if they had CAD. He denies orthopnea. Has been SOB with minimal activity the past week.   Review of Systems: [y] = yes, [ ]  = no   General: Weight gain [y]; Weight loss [ ] ; Anorexia [ ] ; Fatigue [y]; Fever [ ] ; Chills [ ] ; Weakness [ ]   Cardiac: Chest pain/pressure [ ] ; Resting SOB Cove.Etienne[y ]; Exertional SOB [y]; Orthopnea Cove.Etienne[y ]; Pedal Edema [y]; Palpitations [ ] ;  Syncope [ ] ; Presyncope [ ] ; Paroxysmal nocturnal dyspnea[ ]   Pulmonary: Cough [y]; Wheezing[ ] ; Hemoptysis[ ] ; Sputum [ ] ; Snoring [ ]   GI: Vomiting[ ] ; Dysphagia[ ] ; Melena[ ] ; Hematochezia [ ] ; Heartburn[ ] ; Abdominal pain [ ] ; Constipation [ ] ; Diarrhea [ ] ; BRBPR [ ]   GU: Hematuria[ ] ; Dysuria [ ] ; Nocturia[ ]   Vascular: Pain in legs with walking [ ] ; Pain in feet with lying flat [ ] ; Non-healing sores [ ] ; Stroke [ ] ; TIA [ ] ; Slurred speech [ ] ;  Neuro: Headaches[ ] ; Vertigo[ ] ; Seizures[ ] ; Paresthesias[ ] ;Blurred vision [ ] ; Diplopia [ ] ; Vision changes [ ]   Ortho/Skin: Arthritis Cove.Etienne[y ]; Joint pain Cove.Etienne[y ]; Muscle pain [ ] ; Joint swelling [ ] ; Back Pain [ ] ; Rash [ ]   Psych: Depression[ ] ; Anxiety[ ]   Heme: Bleeding problems [ ] ; Clotting disorders [ ] ; Anemia [ ]   Endocrine: Diabetes [ ] ; Thyroid dysfunction[ ]   Home Medications Prior to Admission medications   Medication Sig Start Date End Date Taking? Authorizing Provider  ibuprofen (ADVIL,MOTRIN) 200 MG tablet Take 400 mg by mouth every 6 (six) hours as needed for mild pain.   Yes [provider]    Past Medical History: No past medical history on file.  Past Surgical History: No past surgical history on file.  Family  History: Family History  Problem Relation Age of Onset  . CAD Father     Social History: Social History   Social History  . Marital status: Single    Spouse name: N/A  . Number of children: N/A  . Years of education: N/A   Occupational History  . security    Social History Main Topics  . Smoking status: Current Some Day Smoker    Types: Cigars  . Smokeless tobacco: Not on file     Comment: 6 cigars per wk  . Alcohol use 8.4 oz/week    12 Cans of beer, 2 Shots of liquor per week  . Drug use: No  . Sexual activity: Not on file   Other Topics Concern  . Not on file   Social History Narrative  . No narrative on file    Allergies:  No Known Allergies  Objective:    Vital  Signs:   Temp:  [98.1 F (36.7 C)-98.2 F (36.8 C)] 98.1 F (36.7 C) (10/23 0910) Pulse Rate:  [110-123] 115 (10/23 0910) Resp:  [0-38] 20 (10/23 0910) BP: (81-123)/(53-102) 116/88 (10/23 0910) SpO2:  [93 %-100 %] 100 % (10/23 0910) Weight:  [247 lb (112 kg)-257 lb 12.8 oz (116.9 kg)] 257 lb 12.8 oz (116.9 kg) (10/23 0910)    Weight change: Filed Weights   11/06/16 1608 11/07/16 0910  Weight: 247 lb (112 kg) 257 lb 12.8 oz (116.9 kg)    Intake/Output:   Intake/Output Summary (Last 24 hours) at 11/07/16 0952 Last data filed at 11/07/16 0805  Gross per 24 hour  Intake              250 ml  Output             1350 ml  Net            -1100 ml      Physical Exam    General:  Fatigued appearing. No resp difficulty HEENT: normal Neck: supple. JVP to jaw +. Carotids 2+ bilat; no bruits. No lymphadenopathy or thyromegaly appreciated. Cor: PMI laterally displaced. Regular rate & rhythm. + s3 Lungs: Diminished basilar sound with mild crackles. Dull R basilar sounds.  Abdomen: Soft, nontender, nondistended. No hepatosplenomegaly. No bruits or masses. Good bowel sounds. Extremities: no cyanosis, clubbing, or rash. Cool/Cold to the touch with 2+ edema into thighs.  Neuro: alert & orientedx3, cranial nerves grossly intact. moves all 4 extremities w/o difficulty. Affect pleasant  Telemetry   Sinus tach 110-120, personally reviewed.   EKG    Sinus tach 117 on admit. Low voltage. No qwaves. Personally reviewed   Labs   Basic Metabolic Panel:  Recent Labs Lab 11/06/16 1615 11/07/16 0530  NA 130* 133*  K 4.8 4.7  CL 100* 100*  CO2 21* 22  GLUCOSE 158* 180*  BUN 37* 44*  CREATININE 1.43* 1.82*  CALCIUM 8.0* 7.8*    Liver Function Tests:  Recent Labs Lab 11/07/16 0530  AST 76*  ALT 32  ALKPHOS 161*  BILITOT 2.7*  PROT 7.5  ALBUMIN 1.4*   No results for input(s): LIPASE, AMYLASE in the last 168 hours. No results for input(s): AMMONIA in the last 168  hours.  CBC:  Recent Labs Lab 11/06/16 1615 11/07/16 0931  WBC 16.0* 15.2*  HGB 13.9 13.1  HCT 41.2 39.6  MCV 86.4 85.9  PLT 369 344    Cardiac Enzymes: No results for input(s): CKTOTAL, CKMB, CKMBINDEX, TROPONINI in the last 168 hours.  BNP:  BNP (last 3 results)  Recent Labs  11/06/16 1621  BNP 961.6*    ProBNP (last 3 results) No results for input(s): PROBNP in the last 8760 hours.   CBG: No results for input(s): GLUCAP in the last 168 hours.  Coagulation Studies: No results for input(s): LABPROT, INR in the last 72 hours.   Imaging   Dg Chest 2 View  Result Date: 11/06/2016 CLINICAL DATA:  Shortness of breath. EXAM: CHEST  2 VIEW COMPARISON:  None. FINDINGS: Cardiomegaly. Normal pulmonary vascularity. Right middle and lower lobe consolidation. Small right pleural effusion. The left lung is clear. No pneumothorax. No acute osseous abnormality. IMPRESSION: Right middle and lower lobe consolidation, suspicious for pneumonia. Small right pleural effusion. Followup PA and lateral chest X-ray is recommended in 3-4 weeks following trial of antibiotic therapy to ensure resolution and exclude underlying malignancy. The Electronically Signed   By: Obie Dredge M.D.   On: 11/06/2016 16:58     Medications:     Current Medications: . aspirin EC  81 mg Oral Daily  . heparin  5,000 Units Subcutaneous Q8H  . sodium chloride flush  3 mL Intravenous Q12H    Infusions: . sodium chloride        Patient Profile   Benjamin Sherman is a 46 y.o. male with no previous medical history  Presented with worsening SOB and LE edema. New diagnosis of systolic CHF  Assessment/Plan   1. Acute combined CHF with LV and RV dysfunction  - EF 15% with RV reduction on bedside echo on admit -> Formal Echo pending - BNP elevated.  - NYHA I-II at baseline, but NYHA III-IIIB this week.  - Volume status markedly elevated. Legs with 1-2+ edema into thighs and cool to the touch.  -  Start lasix 80 mg IV BID and follow response. May need to add milrinone to diurese. Will order PICC line for CVP and Coox.  - Place ted hose.  - Hesitant to start spiro with borderline high K. Suspect this is somewhat related to AKI.  - No ACE/ARB for now with rising creatinine - No BB with likely low output.   - Start digoxin 0.125 mg daily.  - Start hydralazine 12.5 mg TID - Start imdur 15 mg daily.  - Unclear etiology. Will need R/LHC. Creatinine now elevated from yesterday, so may need to optimize first. - HIV antibody pending.   2. ID - ? Infectious component with vague history of viral URI.  - WBC 16 -> 15.2.  - CXR concerning for PNA. Given one dose ABX.   3. 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 Concern for cardiorenal component.  - May need inotrope to diurese.   4. Hypothyroidism - TSH 8.4 on admit ( Ref Range 0.350 - 4.500) - T3 and T4 pending. May need levothyroxine.   5. LV thrombus - Noted on preliminary echo.  - Will need at least short term anticoagulation awaiting for improval of his EF.  - Start heparin.   Length of Stay: 1  Luane School  11/07/2016, 9:52 AM  Advanced Heart Failure Team Pager [phone] (M-F; 7a - 4p)  Please contact [facility] for night-coverage after hours (4p -7a ) and weekends on [email or web address]  46 y/o male with strong family Hx of CAD but no significant PMHx. Admitted with several weeks of progressive HF symptoms. Echo today reviewed personally EF 20% with RV dysfunction and likely apical clot. Not diuresing well and creatinine up. Suspect low output.  Will transfer to SDU and place PICC. Continue IV lasix start hydralazine/nitrates. Low threshold for inotropes. No spiro or ACE yet with AKI. No b-blocker yet with acute decompensation.   Will need R/L cath and possible cMRI when more stable.   [name], MD  11:37 AM

## 2016-11-07 NOTE — Progress Notes (Signed)
Verified with Mardelle Matte, [place] cardiology that pt could go to 4 [place] stepdown as long as they can to CVPs and coox. Verified with charge RN, Konrad Felix  on unit 4 [place] that they can do these. Nurse to receive report unable to get report at this moment, will return call shortly.  Pt informed of transfer order, why and new unit/room number.

## 2016-11-07 NOTE — Progress Notes (Signed)
  Echocardiogram 2D Echocardiogram with definity has been performed.  Leta Jungling M 11/07/2016, 10:47 AM

## 2016-11-07 NOTE — Progress Notes (Signed)
Report given to [facility] on unit 4 [place].

## 2016-11-07 NOTE — ED Notes (Signed)
Pt on 2L [place].  

## 2016-11-07 NOTE — ED Notes (Signed)
Cardiology at bedside.

## 2016-11-07 NOTE — Progress Notes (Signed)
ANTICOAGULATION CONSULT NOTE - Initial Consult  Pharmacy Consult for heparin Indication: Lv Thrombus  No Known Allergies  Patient Measurements: Height: 6' (182.9 cm) Weight: 257 lb 12.8 oz (116.9 kg) IBW/kg (Calculated) : 77.6 Heparin Dosing Weight: 103 kg  Vital Signs: Temp: 98.1 F (36.7 C) (10/23 0910) Temp Source: Oral (10/23 0910) BP: 116/88 (10/23 0910) Pulse Rate: 115 (10/23 0910)  Labs:  Recent Labs  11/06/16 1615 11/07/16 0530 11/07/16 0931  HGB 13.9  --  13.1  HCT 41.2  --  39.6  PLT 369  --  344  CREATININE 1.43* 1.82*  --     Estimated Creatinine Clearance: 65.5 mL/min (A) (by C-G formula based on SCr of 1.82 mg/dL (H)).   Medical History: No past medical history on file.   Assessment: 56 yoM admitted with ADHF found to have likely LV thrombus on preliminary ECHO to start on IV heparin. No OAC noted PTA but pt received SQ heparin this morning. CBC stable.  Goal of Therapy:  Heparin level 0.3-0.7 units/ml Monitor platelets by anticoagulation protocol: Yes   Plan:  -Heparin 1500 units/hr -Check 6-hr heparin level -Monitor heparin level, CBC, S/Sx bleeding daily  Fredonia Highland, PharmD PGY-2 Cardiology Pharmacy Resident Pager: [phone] 11/07/2016

## 2016-11-07 NOTE — Care Management Note (Signed)
Case Management Note  Patient Details  Name: Benjamin Sherman MRN: [id number] Date of Birth: 18-Aug-1970  Subjective/Objective:   CHF                Action/Plan: Patient lives at home with a roommate; works full time in Office manager; has not seen a physician in 21 years, since Hotel manager but does not have any Military benefits per patient. No PCP / no medical insurance; patient stated that he plans to enroll for medical insurance at his job in November; he is agreeable to go to the [facility] for follow up medical care; he eats out a lot, does not exercise and consumes a large amount of beer on the weekend. CM talked to patient about making lifestyles changes, he is agreeable to making changes in his life. CM will continue to follow for DCP.  Expected Discharge Date:    possibly 11/12/2016              Expected Discharge Plan:  Home/Self Care  In-House Referral:   Dietitian, Financial Counselor  Discharge planning Services  CM Consult  Status of Service:  In process, will continue to follow  Reola Mosher [phone] 11/07/2016, 11:02 AM

## 2016-11-07 NOTE — ED Notes (Signed)
Pt resting at this time, watching television,  No complaints voiced

## 2016-11-08 ENCOUNTER — Inpatient Hospital Stay (HOSPITAL_COMMUNITY): Payer: Medicaid Other

## 2016-11-08 ENCOUNTER — Encounter (HOSPITAL_COMMUNITY): Payer: Self-pay | Admitting: *Deleted

## 2016-11-08 DIAGNOSIS — I513 Intracardiac thrombosis, not elsewhere classified: Secondary | ICD-10-CM

## 2016-11-08 DIAGNOSIS — I429 Cardiomyopathy, unspecified: Secondary | ICD-10-CM

## 2016-11-08 LAB — BASIC METABOLIC PANEL WITH GFR
Anion gap: 10 (ref 5–15)
BUN: 44 mg/dL — ABNORMAL HIGH (ref 6–20)
CO2: 21 mmol/L — ABNORMAL LOW (ref 22–32)
Calcium: 7.7 mg/dL — ABNORMAL LOW (ref 8.9–10.3)
Chloride: 101 mmol/L (ref 101–111)
Creatinine, Ser: 1.61 mg/dL — ABNORMAL HIGH (ref 0.61–1.24)
GFR calc Af Amer: 57 mL/min — ABNORMAL LOW
GFR calc non Af Amer: 49 mL/min — ABNORMAL LOW
Glucose, Bld: 133 mg/dL — ABNORMAL HIGH (ref 65–99)
Potassium: 3.8 mmol/L (ref 3.5–5.1)
Sodium: 132 mmol/L — ABNORMAL LOW (ref 135–145)

## 2016-11-08 LAB — LIPID PANEL
Cholesterol: 69 mg/dL (ref 0–200)
HDL: 16 mg/dL — ABNORMAL LOW (ref >40)
LDL Cholesterol: 42 mg/dL (ref 0–99)
Total CHOL/HDL Ratio: 4.3 RATIO
Triglycerides: 56 mg/dL (ref <150)
VLDL: 11 mg/dL (ref 0–40)

## 2016-11-08 LAB — CBC
HCT: 36.7 % — ABNORMAL LOW (ref 39.0–52.0)
Hemoglobin: 12.4 g/dL — ABNORMAL LOW (ref 13.0–17.0)
MCH: 28.8 pg (ref 26.0–34.0)
MCHC: 33.8 g/dL (ref 30.0–36.0)
MCV: 85.2 fL (ref 78.0–100.0)
Platelets: 264 10*3/uL (ref 150–400)
RBC: 4.31 MIL/uL (ref 4.22–5.81)
RDW: 15.6 % — ABNORMAL HIGH (ref 11.5–15.5)
WBC: 14 10*3/uL — ABNORMAL HIGH (ref 4.0–10.5)

## 2016-11-08 LAB — COOXEMETRY PANEL
Carboxyhemoglobin: 0.9 % (ref 0.5–1.5)
Methemoglobin: 1 % (ref 0.0–1.5)
O2 Saturation: 57.3 %
Total hemoglobin: 12.6 g/dL (ref 12.0–16.0)

## 2016-11-08 LAB — HEPARIN LEVEL (UNFRACTIONATED)
Heparin Unfractionated: 0.35 IU/mL (ref 0.30–0.70)
Heparin Unfractionated: 0.28 IU/mL — ABNORMAL LOW (ref 0.30–0.70)
Heparin Unfractionated: 0.33 IU/mL (ref 0.30–0.70)

## 2016-11-08 LAB — T3, FREE: T3, Free: 1.6 pg/mL — ABNORMAL LOW (ref 2.0–4.4)

## 2016-11-08 LAB — HIV ANTIBODY (ROUTINE TESTING W REFLEX): HIV Screen 4th Generation wRfx: NONREACTIVE

## 2016-11-08 MED ORDER — POTASSIUM CHLORIDE CRYS ER 20 MEQ PO TBCR
40.0000 meq | EXTENDED_RELEASE_TABLET | Freq: Once | ORAL | Status: AC
  Administered 2016-11-08: 40 meq via ORAL
  Filled 2016-11-08: qty 2

## 2016-11-08 MED ORDER — GADOBENATE DIMEGLUMINE 529 MG/ML IV SOLN
38.0000 mL | Freq: Once | INTRAVENOUS | Status: AC | PRN
  Administered 2016-11-08: 38 mL via INTRAVENOUS

## 2016-11-08 MED ORDER — HYDRALAZINE HCL 25 MG PO TABS
37.5000 mg | ORAL_TABLET | Freq: Three times a day (TID) | ORAL | Status: DC
  Administered 2016-11-08 – 2016-11-09 (×3): 37.5 mg via ORAL
  Filled 2016-11-08 (×4): qty 2

## 2016-11-08 MED ORDER — ISOSORBIDE MONONITRATE ER 30 MG PO TB24
30.0000 mg | ORAL_TABLET | Freq: Every day | ORAL | Status: DC
  Administered 2016-11-09 – 2016-11-16 (×8): 30 mg via ORAL
  Filled 2016-11-08 (×8): qty 1

## 2016-11-08 NOTE — Progress Notes (Signed)
ANTICOAGULATION CONSULT NOTE   Pharmacy Consult for Heparin Indication: LV Thrombus   No Known Allergies  Patient Measurements: Height: 6' (182.9 cm) Weight: 257 lb 12.8 oz (116.9 kg) IBW/kg (Calculated) : 77.6 Heparin Dosing Weight: 103 kg  Vital Signs: Temp: 98.2 F (36.8 C) (10/23 2022) Temp Source: Oral (10/23 2022) BP: 112/88 (10/24 0000) Pulse Rate: 113 (10/24 0000)  Labs:  Recent Labs  11/06/16 1615 11/07/16 0530 11/07/16 0931 11/07/16 1810 11/08/16 0041  HGB 13.9  --  13.1  --   --   HCT 41.2  --  39.6  --   --   PLT 369  --  344  --   --   HEPARINUNFRC  --   --   --  0.12* 0.35  CREATININE 1.43* 1.82*  --   --   --     Estimated Creatinine Clearance: 65.5 mL/min (A) (by C-G formula based on SCr of 1.82 mg/dL (H)).   Medical History: No past medical history on file.   Assessment: 64 yoM admitted with ADHF found to have likely LV thrombus on preliminary ECHO to start on IV heparin.  Heparin level therapeutic tonight after rate increase  Goal of Therapy:  Heparin level 0.3-0.7 units/ml Monitor platelets by anticoagulation protocol: Yes   Plan:  -Cont heparin at 1750 units/hr -AM heparin level   Abran Duke, PharmD, BCPS Clinical Pharmacist Phone: [phone]

## 2016-11-08 NOTE — Progress Notes (Signed)
CARDIAC REHAB PHASE I   Pt in bed, on phone, declines ambulation at this time, states "no particular reason", states "I'd rather wait." Encouraged ambulation, will plan to follow up tomorrow.   Joylene Grapes, RN, BSN 11/08/2016 2:08 PM

## 2016-11-08 NOTE — Progress Notes (Signed)
Nutrition Education Note  Pt is a 46 year old male admitted for acute systolic CHF with worsening SOB and LE edema. No previous medical history.   Pt reports eating a lot of sodium in his diet. He typically eats pasta, burgers, french fries, etc. Pt reports drinking water at work, alcohol on the weekends, and a lot of soda lately.   Discussed with pt the importance of limiting sodium, reading nutrition labels, and awareness of Na when eating at restaurants. We discussed other ways to season food such as Mrs. Dash, herbs, and spices. Encouraged pt to eat more fruits and vegetables. Talked with pt about not drinking an excessive amount of fluid as it could cause fluid retention. Encouraged pt to purchase a scale and monitor weight fluctuations on a day to day basis.  Discussed and provided Nutrition Care Manual handouts "Heart Failure Nutrition Therapy" and "Sodium Free Flavoring Tips".  Pt plans to implement regular exercise and healthy eating habits upon discharge. Says that he is getting rid of his salt shaker.Expect good compliance.  Chart reviewed.  Wynetta Emery [facility] Dietetic Intern Pager: [phone] 11/08/2016 3:35 PM

## 2016-11-08 NOTE — Progress Notes (Signed)
Pt had 13b run of v-tach. Was sitting on edge of bed. Pt asymptomatic. Will continue to monitor.

## 2016-11-08 NOTE — Progress Notes (Addendum)
Advanced Heart Failure Rounding Note  PCP:  Primary Cardiologist:   Subjective:   Yesterday diuresed with IV lasix and started on dig, hydralazine, and imdur. Creatinine trending down 1.8>1.6. Brisk diuresis noted.   CVP 17-> 13  CO-OX 57%.   Denies SOB/CP.    Objective:   Weight Range: 256 lb 8 oz (116.3 kg) Body mass index is 34.79 kg/m.   Vital Signs:   Temp:  [97.6 F (36.4 C)-98.3 F (36.8 C)] 97.6 F (36.4 C) (10/24 0805) Pulse Rate:  [73-115] 104 (10/24 0805) Resp:  [17-38] 22 (10/24 0805) BP: (99-118)/(63-92) 111/88 (10/24 0805) SpO2:  [98 %-100 %] 99 % (10/24 0805) Weight:  [256 lb 8 oz (116.3 kg)-257 lb 12.8 oz (116.9 kg)] 256 lb 8 oz (116.3 kg) (10/24 0400) Last BM Date: 11/07/16  Weight change: Filed Weights   11/06/16 1608 11/07/16 0910 11/08/16 0400  Weight: 247 lb (112 kg) 257 lb 12.8 oz (116.9 kg) 256 lb 8 oz (116.3 kg)    Intake/Output:   Intake/Output Summary (Last 24 hours) at 11/08/16 0838 Last data filed at 11/08/16 0600  Gross per 24 hour  Intake           371.21 ml  Output             3310 ml  Net         -2938.79 ml      Physical Exam   CVP 13-14.  General:  Well appearing. No resp difficulty. Sitting on the side of the bed.  HEENT: Normal Neck: Supple. JVP to jaw  Carotids 2+ bilat; no bruits. No lymphadenopathy or thyromegaly appreciated. Cor: PMI laterally displaced. Regular rate & rhythm. No rubs or murmurs.+ S3 Lungs: Clear Abdomen: Soft, nontender, nondistended. No hepatosplenomegaly. No bruits or masses. Good bowel sounds. Extremities: No cyanosis, clubbing, rash, R and LLE 3+ edema.  Neuro: Alert & orientedx3, cranial nerves grossly intact. moves all 4 extremities w/o difficulty. Affect pleasant   Telemetry   Sinus Tach 100s withpVCs personally reviewed.   EKG   Admit Sinus Tach 117 on admit.    Labs    CBC  Recent Labs  11/07/16 0931 11/08/16 0219  WBC 15.2* 14.0*  HGB 13.1 12.4*  HCT 39.6 36.7*    MCV 85.9 85.2  PLT 344 264   Basic Metabolic Panel  Recent Labs  11/07/16 0530 11/08/16 0219  NA 133* 132*  K 4.7 3.8  CL 100* 101  CO2 22 21*  GLUCOSE 180* 133*  BUN 44* 44*  CREATININE 1.82* 1.61*  CALCIUM 7.8* 7.7*   Liver Function Tests  Recent Labs  11/07/16 0530  AST 76*  ALT 32  ALKPHOS 161*  BILITOT 2.7*  PROT 7.5  ALBUMIN 1.4*   No results for input(s): LIPASE, AMYLASE in the last 72 hours. Cardiac Enzymes No results for input(s): CKTOTAL, CKMB, CKMBINDEX, TROPONINI in the last 72 hours.  BNP: BNP (last 3 results)  Recent Labs  11/06/16 1621  BNP 961.6*    ProBNP (last 3 results) No results for input(s): PROBNP in the last 8760 hours.   D-Dimer No results for input(s): DDIMER in the last 72 hours. Hemoglobin A1C No results for input(s): HGBA1C in the last 72 hours. Fasting Lipid Panel  Recent Labs  11/08/16 0219  CHOL 69  HDL 16*  LDLCALC 42  TRIG 56  CHOLHDL 4.3   Thyroid Function Tests  Recent Labs  11/06/16 2240 11/07/16 0931  TSH 8.439*  --  T3FREE  --  1.6*    Other results:   Imaging     No results found.   Medications:     Scheduled Medications: . aspirin EC  81 mg Oral Daily  . digoxin  0.125 mg Oral Daily  . furosemide  80 mg Intravenous Q12H  . hydrALAZINE  25 mg Oral Q8H  . isosorbide mononitrate  15 mg Oral Daily  . sodium chloride flush  10-40 mL Intracatheter Q12H  . sodium chloride flush  3 mL Intravenous Q12H     Infusions: . sodium chloride    . heparin 1,750 Units/hr (11/08/16 0424)     PRN Medications:  sodium chloride, acetaminophen, ondansetron (ZOFRAN) IV, sodium chloride flush, sodium chloride flush    Patient Profile  Roxy HorsemanSteven Ramstad is a 46 y.o. male with no previous medical history  Presented with worsening SOB and LE edema. New diagnosis of systolic CHF  Assessment/Plan   1. 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 Possible thrombus, need cMRI  to evaluate. Set up cMRI if creatinine is < 1.5  - Volume status improving but still with elevated CVP. CVP trending down 17>14. -Continue to diurese with IV lasix 80 mg twice a daily.  - No ACE/ARB for now with rising creatinine - No BB with likely low output.   - Continue digoxin 0.125 mg daily.  - Increase hydralazine 37.5 mg three times a day.  - Increase imdur 30 mg daily.   - HIV non reactive.  -Renal function a little.   2. ID - ? Infectious component with vague history of viral URI.  - WBC 16 -> 15.2->14   - CXR concerning for PNA. Given one dose ABX.   3. 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 Hypothyroidism - TSH 8.4 on admit ( Ref Range 0.350 - 4.500) - T3 1.6 T4 within normal range.    5. LV thrombus -Continue heparin.    Consult cardiac rehab.    Length of Stay: 2   Tonye BecketAmy Clegg, NP  11/08/2016, 8:38 AM  Advanced Heart Failure Team Pager [phone] (M-F; 7a - 4p)  Please contact [facility] for night-coverage after hours (4p -7a ) and weekends on [email or web address]  Patient seen and examined with Tonye BecketAmy Clegg, NP. We discussed all aspects of the encounter. I agree with the assessment and plan as stated above.   Symptomatically improved but co-ox remains marginal and also with significant volume overload. Continue to diurese. Increase afterload reduction. May be able to [facility]. Plan cMRI tomorrow. R/L cath Friday. Continue heparin for LV thrombus. Disussed with PharmD. Will need coumadin after cath.   Arvilla MeresBensimhon, Faun Mcqueen, MD  10:38 AM

## 2016-11-08 NOTE — Progress Notes (Signed)
ANTICOAGULATION CONSULT NOTE   Pharmacy Consult for heparin Indication: Lv Thrombus  No Known Allergies  Patient Measurements: Height: 6' (182.9 cm) Weight: 256 lb 8 oz (116.3 kg) IBW/kg (Calculated) : 77.6 Heparin Dosing Weight: 103 kg  Vital Signs: Temp: 97.6 F (36.4 C) (10/24 0805) Temp Source: Oral (10/24 0805) BP: 111/88 (10/24 0805) Pulse Rate: 110 (10/24 0915)  Labs:  Recent Labs  11/06/16 1615 11/07/16 0530 11/07/16 0931  11/08/16 0041 11/08/16 0219 11/08/16 0824  HGB 13.9  --  13.1  --   --  12.4*  --   HCT 41.2  --  39.6  --   --  36.7*  --   PLT 369  --  344  --   --  264  --   HEPARINUNFRC  --   --   --   < > 0.35 0.28* 0.33  CREATININE 1.43* 1.82*  --   --   --  1.61*  --   < > = values in this interval not displayed.  Estimated Creatinine Clearance: 73.9 mL/min (A) (by C-G formula based on SCr of 1.61 mg/dL (H)).   Medical History: History reviewed. No pertinent past medical history.   Assessment: 67 yoM admitted with ADHF found to have likely LV thrombus on preliminary ECHO to start on IV heparin. Heparin level therapeutic at 0.33 but on low end of goal range, CBC stable.  Goal of Therapy:  Heparin level 0.3-0.7 units/ml Monitor platelets by anticoagulation protocol: Yes   Plan:  -Increase IV heparin slightly to 1800 units/hr. -Monitor heparin level, CBC, S/Sx bleeding daily  Fredonia Highland, PharmD PGY-2 Cardiology Pharmacy Resident Pager: [phone] 11/08/2016

## 2016-11-08 NOTE — Progress Notes (Signed)
Spoke to MD about ted hose. Decided not appropriate for patient for because of weeping legs. New orders to follow.  Versie Starks, RN

## 2016-11-08 NOTE — Progress Notes (Signed)
CCMD notified that patient had 8 beat of V tach. Pt asymptomatic. VSS. Will continue to monitor.  Versie Starks, RN

## 2016-11-09 LAB — CBC
HCT: 37.2 % — ABNORMAL LOW (ref 39.0–52.0)
Hemoglobin: 12.5 g/dL — ABNORMAL LOW (ref 13.0–17.0)
MCH: 28.9 pg (ref 26.0–34.0)
MCHC: 33.6 g/dL (ref 30.0–36.0)
MCV: 85.9 fL (ref 78.0–100.0)
Platelets: 255 10*3/uL (ref 150–400)
RBC: 4.33 MIL/uL (ref 4.22–5.81)
RDW: 15.9 % — ABNORMAL HIGH (ref 11.5–15.5)
WBC: 13 10*3/uL — ABNORMAL HIGH (ref 4.0–10.5)

## 2016-11-09 LAB — CBC WITH DIFFERENTIAL/PLATELET
Basophils Absolute: 0 K/uL (ref 0.0–0.1)
Basophils Relative: 0 %
Eosinophils Absolute: 0.1 K/uL (ref 0.0–0.7)
Eosinophils Relative: 0 %
HCT: 35.8 % — ABNORMAL LOW (ref 39.0–52.0)
Hemoglobin: 11.9 g/dL — ABNORMAL LOW (ref 13.0–17.0)
Lymphocytes Relative: 13 %
Lymphs Abs: 1.5 K/uL (ref 0.7–4.0)
MCH: 28.5 pg (ref 26.0–34.0)
MCHC: 33.2 g/dL (ref 30.0–36.0)
MCV: 85.6 fL (ref 78.0–100.0)
Monocytes Absolute: 0.8 K/uL (ref 0.1–1.0)
Monocytes Relative: 7 %
Neutro Abs: 9.4 K/uL — ABNORMAL HIGH (ref 1.7–7.7)
Neutrophils Relative %: 80 %
Platelets: 260 K/uL (ref 150–400)
RBC: 4.18 MIL/uL — ABNORMAL LOW (ref 4.22–5.81)
RDW: 16.5 % — ABNORMAL HIGH (ref 11.5–15.5)
WBC: 11.8 K/uL — ABNORMAL HIGH (ref 4.0–10.5)

## 2016-11-09 LAB — COOXEMETRY PANEL
Carboxyhemoglobin: 1 % (ref 0.5–1.5)
Methemoglobin: 0.9 % (ref 0.0–1.5)
O2 Saturation: 58.5 %
Total hemoglobin: 12.5 g/dL (ref 12.0–16.0)

## 2016-11-09 LAB — BASIC METABOLIC PANEL
Anion gap: 9 (ref 5–15)
BUN: 39 mg/dL — ABNORMAL HIGH (ref 6–20)
CO2: 23 mmol/L (ref 22–32)
Calcium: 7.9 mg/dL — ABNORMAL LOW (ref 8.9–10.3)
Chloride: 101 mmol/L (ref 101–111)
Creatinine, Ser: 1.38 mg/dL — ABNORMAL HIGH (ref 0.61–1.24)
GFR calc Af Amer: >60 mL/min (ref >60)
GFR calc non Af Amer: 59 mL/min — ABNORMAL LOW (ref >60)
Glucose, Bld: 136 mg/dL — ABNORMAL HIGH (ref 65–99)
Potassium: 4.9 mmol/L (ref 3.5–5.1)
Sodium: 133 mmol/L — ABNORMAL LOW (ref 135–145)

## 2016-11-09 LAB — HEPARIN LEVEL (UNFRACTIONATED): Heparin Unfractionated: 0.37 IU/mL (ref 0.30–0.70)

## 2016-11-09 LAB — PROTIME-INR
INR: 1.27
Prothrombin Time: 15.8 s — ABNORMAL HIGH (ref 11.4–15.2)

## 2016-11-09 MED ORDER — SODIUM CHLORIDE 0.9% FLUSH: 3.0000 mL | INTRAVENOUS | Status: DC | PRN

## 2016-11-09 MED ORDER — HYDRALAZINE HCL 50 MG PO TABS
50.0000 mg | ORAL_TABLET | Freq: Three times a day (TID) | ORAL | Status: DC
  Administered 2016-11-09 – 2016-11-14 (×15): 50 mg via ORAL
  Filled 2016-11-09 (×15): qty 1

## 2016-11-09 MED ORDER — SODIUM CHLORIDE 0.9 % IV SOLN
INTRAVENOUS | Status: DC
  Administered 2016-11-10: 06:00:00 via INTRAVENOUS

## 2016-11-09 MED ORDER — SODIUM CHLORIDE 0.9% FLUSH: 3.0000 mL | Freq: Two times a day (BID) | INTRAVENOUS | Status: DC

## 2016-11-09 MED ORDER — ASPIRIN 81 MG PO CHEW
81.0000 mg | CHEWABLE_TABLET | ORAL | Status: AC
  Administered 2016-11-10: 81 mg via ORAL
  Filled 2016-11-09: qty 1

## 2016-11-09 MED ORDER — SODIUM CHLORIDE 0.9 % IV SOLN: 250.0000 mL | INTRAVENOUS | Status: DC | PRN

## 2016-11-09 NOTE — Progress Notes (Signed)
ANTICOAGULATION CONSULT NOTE   Pharmacy Consult for heparin Indication: Lv Thrombus  No Known Allergies  Patient Measurements: Height: 6' (182.9 cm) Weight: 249 lb 3.2 oz (113 kg) IBW/kg (Calculated) : 77.6 Heparin Dosing Weight: 103 kg  Vital Signs: Temp: 97.8 F (36.6 C) (10/25 0811) Temp Source: Oral (10/25 0811) BP: 112/87 (10/25 0811) Pulse Rate: 96 (10/25 0811)  Labs:  Recent Labs  11/07/16 0530 11/07/16 0931  11/08/16 0219 11/08/16 0824 11/09/16 0353  HGB  --  13.1  --  12.4*  --  12.5*  HCT  --  39.6  --  36.7*  --  37.2*  PLT  --  344  --  264  --  255  HEPARINUNFRC  --   --   < > 0.28* 0.33 0.37  CREATININE 1.82*  --   --  1.61*  --  1.38*  < > = values in this interval not displayed.  Estimated Creatinine Clearance: 85 mL/min (A) (by C-G formula based on SCr of 1.38 mg/dL (H)).   Medical History: History reviewed. No pertinent past medical history.   Assessment: 52 yoM admitted with ADHF found to have likely LV thrombus on preliminary ECHO to start on IV heparin.   Heparin level continues to be therapeutic at 0.37, CBC stable.  Goal of Therapy:  Heparin level 0.3-0.7 units/ml Monitor platelets by anticoagulation protocol: Yes   Plan:  -Continue IV heparin at 1800 units/hr. -Monitor heparin level, CBC, S/Sx bleeding daily  Sheppard Coil PharmD., BCPS Clinical Pharmacist Pager [phone] 11/09/2016 10:24 AM

## 2016-11-09 NOTE — Progress Notes (Signed)
Advanced Heart Failure Rounding Note  PCP:  Primary Cardiologist:   Subjective:    Continues to diurese with IV lasix. Brisk diuresis noted -3.8 liters. CO-OX 58.5%. Yesterday hydralazine/imdur increased.   Feeling better. Denies SOB.   Objective:   Weight Range: 249 lb 3.2 oz (113 kg) Body mass index is 33.8 kg/m.   Vital Signs:   Temp:  [97.8 F (36.6 C)-98.3 F (36.8 C)] 97.8 F (36.6 C) (10/25 0811) Pulse Rate:  [89-120] 96 (10/25 0811) Resp:  [14-27] 22 (10/25 0811) BP: (110-127)/(77-89) 112/87 (10/25 0811) SpO2:  [93 %-100 %] 93 % (10/25 0811) Weight:  [249 lb 3.2 oz (113 kg)] 249 lb 3.2 oz (113 kg) (10/25 0306) Last BM Date: 11/07/16  Weight change: Filed Weights   11/07/16 0910 11/08/16 0400 11/09/16 0306  Weight: 257 lb 12.8 oz (116.9 kg) 256 lb 8 oz (116.3 kg) 249 lb 3.2 oz (113 kg)    Intake/Output:   Intake/Output Summary (Last 24 hours) at 11/09/16 1023 Last data filed at 11/09/16 [id number]  Gross per 24 hour  Intake          1571.86 ml  Output             5375 ml  Net         -3803.14 ml      Physical Exam   General:  Fatigued appearing. No resp difficulty HEENT: normal Neck: supple. no JVP to jaw. Carotids 2+ bilat; no bruits. No lymphadenopathy or thryomegaly appreciated. Cor: PMI laterally displaced. Tachy regular. No rubs, or murmurs. + S3  Lungs: clear Abdomen: soft, nontender, nondistended. No hepatosplenomegaly. No bruits or masses. Good bowel sounds. Extremities: no cyanosis, clubbing, rash, Rand LLE 2+ edema. RUE PICC  Neuro: alert & orientedx3, cranial nerves grossly intact. moves all 4 extremities w/o difficulty. Affect pleasant    Telemetry   Sinus Tach 100s with PVCs. Personally reviewed   EKG   Admit Sinus Tach 117 on admit.    Labs    CBC  Recent Labs  11/08/16 0219 11/09/16 0353  WBC 14.0* 13.0*  HGB 12.4* 12.5*  HCT 36.7* 37.2*  MCV 85.2 85.9  PLT 264 255   Basic Metabolic Panel  Recent Labs   96/04/5408/24/18 0219 11/09/16 0353  NA 132* 133*  K 3.8 4.9  CL 101 101  CO2 21* 23  GLUCOSE 133* 136*  BUN 44* 39*  CREATININE 1.61* 1.38*  CALCIUM 7.7* 7.9*   Liver Function Tests  Recent Labs  11/07/16 0530  AST 76*  ALT 32  ALKPHOS 161*  BILITOT 2.7*  PROT 7.5  ALBUMIN 1.4*   No results for input(s): LIPASE, AMYLASE in the last 72 hours. Cardiac Enzymes No results for input(s): CKTOTAL, CKMB, CKMBINDEX, TROPONINI in the last 72 hours.  BNP: BNP (last 3 results)  Recent Labs  11/06/16 1621  BNP 961.6*    ProBNP (last 3 results) No results for input(s): PROBNP in the last 8760 hours.   D-Dimer No results for input(s): DDIMER in the last 72 hours. Hemoglobin A1C No results for input(s): HGBA1C in the last 72 hours. Fasting Lipid Panel  Recent Labs  11/08/16 0219  CHOL 69  HDL 16*  LDLCALC 42  TRIG 56  CHOLHDL 4.3   Thyroid Function Tests  Recent Labs  11/06/16 2240 11/07/16 0931  TSH 8.439*  --   T3FREE  --  1.6*    Other results:   Imaging    No results found.  Medications:     Scheduled Medications: . aspirin EC  81 mg Oral Daily  . digoxin  0.125 mg Oral Daily  . furosemide  80 mg Intravenous Q12H  . hydrALAZINE  37.5 mg Oral Q8H  . isosorbide mononitrate  30 mg Oral Daily  . sodium chloride flush  10-40 mL Intracatheter Q12H  . sodium chloride flush  3 mL Intravenous Q12H    Infusions: . sodium chloride    . heparin 1,800 Units/hr (11/09/16 6226)    PRN Medications: sodium chloride, acetaminophen, ondansetron (ZOFRAN) IV, sodium chloride flush, sodium chloride flush    Patient Profile  Benjamin Sherman is a 46 y.o. male with no previous medical history  Presented with worsening SOB and LE edema. New diagnosis of systolic CHF  Assessment/Plan   1. 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 Possible thrombus, CMRI completed.   Plan for RHC/LCH on Friday.  - Volume status remains elevated but he  continues to diurese briskly with IV lasix. Continue current dose.  -Continue to diurese with IV lasix 80 mg twice a daily.  - No ACE/ARB for now with rising creatinine - No BB with likely low output.   - Continue digoxin 0.125 mg daily.  - Increase hydralazine to 50 mg three times a day.  - Continue imdur 30 mg daily - Hold off on spiro because K 4.9  - HIV non reactive.  - Add unna boots.   2. ID - ? Infectious component with vague history of viral URI.  - CXR concerning for PNA. Given one dose ABX.  -WBC trending down.   3. 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  - Creatinine trending down 1.8>1.6 >1.3  4. Hypothyroidism - TSH 8.4 on admit ( Ref Range 0.350 - 4.500) - T3 1.6 T4 within normal range.    5. LV thrombus -Continue heparin.  MRI completed.     LHC on Friday.    HF Pharmacy will meet with him tomorrow to discuss HF medications.    Length of Stay: 3   Amy Clegg, NP  11/09/2016, 10:23 AM  Advanced Heart Failure Team Pager [phone] (M-F; 7a - 4p)  Please contact [facility] for night-coverage after hours (4p -7a ) and weekends on [email or web address]  Patient seen and examined with Tonye Becket, NP. We discussed all aspects of the encounter. I agree with the assessment and plan as stated above.   Remains tenuous, Continues to diurese but still volume overloaded. Remains tachycardic. BP soft. Will titrate HF meds as tolerated. Continue IV diuresis. CMRI done today - await results. Continue heparin for LV clot. D/w PharmD, Will plan R/L cath tomorrow. Will need coumadin load prior to d/c due to fresh LV clot  Bensimhon, Reuel Boom, MD  11:33 AM

## 2016-11-09 NOTE — Progress Notes (Signed)
CARDIAC REHAB PHASE I   PRE:  Rate/Rhythm: 114 ST  BP:  Supine:   Sitting: 112/95  Standing:    SaO2: 98%RA  MODE:  Ambulation: 430 ft   POST:  Rate/Rhythm: 118 ST  BP:  Supine:   Sitting: 119/97  Standing:    SaO2: 99%RA 1035-1108 Pt walked 430 ft on RA with steady gait. Tolerated well. Stated felt good to be up walking. Gave CHF booklet and reviewed zones. Discussed daily weights and when to call MD with weight gain. Dicussed watching sodium and adhering to fluid restriction. Also discussed not smoking cigars anymore. Pt stated he plans to quit as he wants to do what he can to be healthier. May need assistance getting scales.   Luetta Nutting, RN BSN  11/09/2016 11:04 AM

## 2016-11-09 NOTE — Progress Notes (Signed)
Orthopedic Tech Progress Note Patient Details:  Benjamin Sherman 1970/07/05 [id number]  Ortho Devices Ortho Device/Splint Location: Bilateral unna boots Ortho Device/Splint Interventions: Application   Saul Fordyce 11/09/2016, 12:21 PM

## 2016-11-10 ENCOUNTER — Encounter (HOSPITAL_COMMUNITY)
Admission: EM | Disposition: A | Discharge status: Home / Self Care | Payer: Self-pay | Source: Home / Self Care | Attending: Internal Medicine

## 2016-11-10 ENCOUNTER — Encounter (HOSPITAL_COMMUNITY): Payer: Self-pay | Admitting: Internal Medicine

## 2016-11-10 ENCOUNTER — Ambulatory Visit (HOSPITAL_COMMUNITY): Admit: 2016-11-10 | Payer: Medicaid Other | Admitting: Internal Medicine

## 2016-11-10 DIAGNOSIS — E039 Hypothyroidism, unspecified: Secondary | ICD-10-CM

## 2016-11-10 HISTORY — PX: RIGHT/LEFT HEART CATH AND CORONARY ANGIOGRAPHY: CATH118266

## 2016-11-10 LAB — BASIC METABOLIC PANEL
Anion gap: 10 (ref 5–15)
BUN: 30 mg/dL — ABNORMAL HIGH (ref 6–20)
CO2: 26 mmol/L (ref 22–32)
Calcium: 8 mg/dL — ABNORMAL LOW (ref 8.9–10.3)
Chloride: 98 mmol/L — ABNORMAL LOW (ref 101–111)
Creatinine, Ser: 1.2 mg/dL (ref 0.61–1.24)
GFR calc Af Amer: >60 mL/min (ref >60)
GFR calc non Af Amer: >60 mL/min (ref >60)
Glucose, Bld: 178 mg/dL — ABNORMAL HIGH (ref 65–99)
Potassium: 3.5 mmol/L (ref 3.5–5.1)
Sodium: 134 mmol/L — ABNORMAL LOW (ref 135–145)

## 2016-11-10 LAB — POCT I-STAT 3, VENOUS BLOOD GAS (G3P V)
Acid-Base Excess: 2 mmol/L (ref 0.0–2.0)
Acid-Base Excess: 2 mmol/L (ref 0.0–2.0)
Bicarbonate: 26.3 mmol/L (ref 20.0–28.0)
Bicarbonate: 26.5 mmol/L (ref 20.0–28.0)
O2 Saturation: 58 %
O2 Saturation: 62 %
TCO2: 27 mmol/L (ref 22–32)
TCO2: 28 mmol/L (ref 22–32)
pCO2, Ven: 38.1 mmHg — ABNORMAL LOW (ref 44.0–60.0)
pCO2, Ven: 38.4 mmHg — ABNORMAL LOW (ref 44.0–60.0)
pH, Ven: 7.447 — ABNORMAL HIGH (ref 7.250–7.430)
pH, Ven: 7.447 — ABNORMAL HIGH (ref 7.250–7.430)
pO2, Ven: 29 mmHg — CL (ref 32.0–45.0)
pO2, Ven: 31 mmHg — CL (ref 32.0–45.0)

## 2016-11-10 LAB — POCT I-STAT 3, ART BLOOD GAS (G3+)
Acid-Base Excess: 2 mmol/L (ref 0.0–2.0)
Acid-Base Excess: 3 mmol/L — ABNORMAL HIGH (ref 0.0–2.0)
Acid-Base Excess: 3 mmol/L — ABNORMAL HIGH (ref 0.0–2.0)
Bicarbonate: 24.4 mmol/L (ref 20.0–28.0)
Bicarbonate: 25 mmol/L (ref 20.0–28.0)
Bicarbonate: 25.5 mmol/L (ref 20.0–28.0)
O2 Saturation: 95 %
O2 Saturation: 97 %
O2 Saturation: 98 %
TCO2: 25 mmol/L (ref 22–32)
TCO2: 26 mmol/L (ref 22–32)
TCO2: 26 mmol/L (ref 22–32)
pCO2 arterial: 29.8 mmHg — ABNORMAL LOW (ref 32.0–48.0)
pCO2 arterial: 30.9 mmHg — ABNORMAL LOW (ref 32.0–48.0)
pCO2 arterial: 31.6 mmHg — ABNORMAL LOW (ref 32.0–48.0)
pH, Arterial: 7.515 — ABNORMAL HIGH (ref 7.350–7.450)
pH, Arterial: 7.516 — ABNORMAL HIGH (ref 7.350–7.450)
pH, Arterial: 7.522 — ABNORMAL HIGH (ref 7.350–7.450)
pO2, Arterial: 68 mmHg — ABNORMAL LOW (ref 83.0–108.0)
pO2, Arterial: 83 mmHg (ref 83.0–108.0)
pO2, Arterial: 86 mmHg (ref 83.0–108.0)

## 2016-11-10 LAB — POCT ACTIVATED CLOTTING TIME: Activated Clotting Time: 180 seconds

## 2016-11-10 LAB — HEPARIN LEVEL (UNFRACTIONATED): Heparin Unfractionated: 0.29 IU/mL — ABNORMAL LOW (ref 0.30–0.70)

## 2016-11-10 SURGERY — RIGHT/LEFT HEART CATH AND CORONARY ANGIOGRAPHY
Anesthesia: LOCAL

## 2016-11-10 MED ORDER — ACETAMINOPHEN 325 MG PO TABS
650.0000 mg | ORAL_TABLET | ORAL | Status: DC | PRN
Start: 2016-11-10 — End: 2016-11-10

## 2016-11-10 MED ORDER — ONDANSETRON HCL 4 MG/2ML IJ SOLN: 4.0000 mg | Freq: Four times a day (QID) | INTRAMUSCULAR | Status: DC | PRN

## 2016-11-10 MED ORDER — MIDAZOLAM HCL 2 MG/2ML IJ SOLN
INTRAMUSCULAR | Status: DC | PRN
  Administered 2016-11-10: 2 mg via INTRAVENOUS

## 2016-11-10 MED ORDER — VERAPAMIL HCL 2.5 MG/ML IV SOLN
INTRAVENOUS | Status: AC
  Filled 2016-11-10: qty 2

## 2016-11-10 MED ORDER — IOPAMIDOL (ISOVUE-370) INJECTION 76%
INTRAVENOUS | Status: DC | PRN
  Administered 2016-11-10: 90 mL via INTRA_ARTERIAL

## 2016-11-10 MED ORDER — SODIUM CHLORIDE 0.9 % IV SOLN: INTRAVENOUS | Status: AC

## 2016-11-10 MED ORDER — VERAPAMIL HCL 2.5 MG/ML IV SOLN
INTRAVENOUS | Status: DC | PRN
  Administered 2016-11-10: 10 mL via INTRA_ARTERIAL

## 2016-11-10 MED ORDER — FENTANYL CITRATE (PF) 100 MCG/2ML IJ SOLN
INTRAMUSCULAR | Status: DC | PRN
  Administered 2016-11-10: 25 ug via INTRAVENOUS

## 2016-11-10 MED ORDER — FENTANYL CITRATE (PF) 100 MCG/2ML IJ SOLN
INTRAMUSCULAR | Status: AC
  Filled 2016-11-10: qty 2

## 2016-11-10 MED ORDER — WARFARIN - PHARMACIST DOSING INPATIENT: Freq: Every day | Status: DC

## 2016-11-10 MED ORDER — HEPARIN (PORCINE) IN NACL 2-0.9 UNIT/ML-% IJ SOLN
INTRAMUSCULAR | Status: AC | PRN
  Administered 2016-11-10: 1000 mL

## 2016-11-10 MED ORDER — HEPARIN SODIUM (PORCINE) 1000 UNIT/ML IJ SOLN
INTRAMUSCULAR | Status: DC | PRN
  Administered 2016-11-10: 5000 [IU] via INTRAVENOUS

## 2016-11-10 MED ORDER — SODIUM CHLORIDE 0.9% FLUSH
3.0000 mL | INTRAVENOUS | Status: DC | PRN
  Administered 2016-11-11: 3 mL via INTRAVENOUS
  Filled 2016-11-10: qty 3

## 2016-11-10 MED ORDER — MIDAZOLAM HCL 2 MG/2ML IJ SOLN
INTRAMUSCULAR | Status: AC
  Filled 2016-11-10: qty 2

## 2016-11-10 MED ORDER — POTASSIUM CHLORIDE CRYS ER 20 MEQ PO TBCR
40.0000 meq | EXTENDED_RELEASE_TABLET | Freq: Two times a day (BID) | ORAL | Status: AC
  Administered 2016-11-10 (×2): 40 meq via ORAL
  Filled 2016-11-10 (×3): qty 2

## 2016-11-10 MED ORDER — LIDOCAINE HCL 2 % IJ SOLN
INTRAMUSCULAR | Status: DC | PRN
  Administered 2016-11-10 (×2): 5 mL

## 2016-11-10 MED ORDER — HEPARIN (PORCINE) IN NACL 2-0.9 UNIT/ML-% IJ SOLN
INTRAMUSCULAR | Status: AC
  Filled 2016-11-10: qty 1000

## 2016-11-10 MED ORDER — SODIUM CHLORIDE 0.9% FLUSH
3.0000 mL | Freq: Two times a day (BID) | INTRAVENOUS | Status: DC
  Administered 2016-11-10: 3 mL via INTRAVENOUS

## 2016-11-10 MED ORDER — SODIUM CHLORIDE 0.9 % IV SOLN: 250.0000 mL | INTRAVENOUS | Status: DC | PRN

## 2016-11-10 MED ORDER — WARFARIN SODIUM 7.5 MG PO TABS
7.5000 mg | ORAL_TABLET | Freq: Once | ORAL | Status: AC
  Administered 2016-11-10: 7.5 mg via ORAL
  Filled 2016-11-10: qty 1

## 2016-11-10 MED ORDER — IOPAMIDOL (ISOVUE-370) INJECTION 76%
INTRAVENOUS | Status: AC
  Filled 2016-11-10: qty 50

## 2016-11-10 MED ORDER — HEPARIN (PORCINE) IN NACL 100-0.45 UNIT/ML-% IJ SOLN
1950.0000 [IU]/h | INTRAMUSCULAR | Status: DC
  Administered 2016-11-10: 1800 [IU]/h via INTRAVENOUS
  Administered 2016-11-11: 2100 [IU]/h via INTRAVENOUS
  Administered 2016-11-11: 1900 [IU]/h via INTRAVENOUS
  Administered 2016-11-12 – 2016-11-13 (×4): 2100 [IU]/h via INTRAVENOUS
  Administered 2016-11-14 (×2): 1950 [IU]/h via INTRAVENOUS
  Filled 2016-11-10 (×9): qty 250

## 2016-11-10 MED ORDER — IOPAMIDOL (ISOVUE-370) INJECTION 76%
INTRAVENOUS | Status: AC
  Filled 2016-11-10: qty 100

## 2016-11-10 SURGICAL SUPPLY — 18 items
CATH BALLN WEDGE 5F 110CM (CATHETERS) ×1 IMPLANT
CATH INFINITI 5 FR JL3.5 (CATHETERS) ×1 IMPLANT
CATH INFINITI 5FR AL1 (CATHETERS) ×1 IMPLANT
CATH INFINITI 5FR JL4 (CATHETERS) ×1 IMPLANT
CATH INFINITI JR4 5F (CATHETERS) ×1 IMPLANT
CATH LAUNCHER 5F JL3 (CATHETERS) IMPLANT
CATHETER LAUNCHER 5F JL3 (CATHETERS) ×2
DEVICE RAD COMP TR BAND LRG (VASCULAR PRODUCTS) ×1 IMPLANT
GLIDESHEATH SLEND SS 6F .021 (SHEATH) ×1 IMPLANT
GUIDEWIRE INQWIRE 1.5J.035X260 (WIRE) IMPLANT
HOVERMATT SINGLE USE (MISCELLANEOUS) ×1 IMPLANT
INQWIRE 1.5J .035X260CM (WIRE) ×2
KIT HEART LEFT (KITS) ×2 IMPLANT
PACK CARDIAC CATHETERIZATION (CUSTOM PROCEDURE TRAY) ×2 IMPLANT
SHEATH GLIDE SLENDER 4/5FR (SHEATH) ×1 IMPLANT
TRANSDUCER W/STOPCOCK (MISCELLANEOUS) ×2 IMPLANT
TUBING CIL FLEX 10 FLL-RA (TUBING) ×2 IMPLANT
WIRE EMERALD 3MM-J .025X260CM (WIRE) ×1 IMPLANT

## 2016-11-10 NOTE — Progress Notes (Signed)
Patient off to cath lab

## 2016-11-10 NOTE — Interval H&P Note (Signed)
History and Physical Interval Note:  11/10/2016 7:44 AM  Benjamin Sherman  has presented today for surgery, with the diagnosis of hf  The various methods of treatment have been discussed with the patient and family. After consideration of risks, benefits and other options for treatment, the patient has consented to  Procedure(s): RIGHT/LEFT HEART CATH AND CORONARY ANGIOGRAPHY (N/A) and possible coronary intervention as a surgical intervention .  The patient's history has been reviewed, patient examined, no change in status, stable for surgery.  I have reviewed the patient's chart and labs.  Questions were answered to the patient's satisfaction.     [name], Reuel Boom

## 2016-11-10 NOTE — H&P (View-Only) (Signed)
  Advanced Heart Failure Rounding Note  PCP:  Primary Cardiologist:   Subjective:    Continues to diurese with IV lasix. Brisk diuresis noted -3.8 liters. CO-OX 58.5%. Yesterday hydralazine/imdur increased.   Feeling better. Denies SOB.   Objective:   Weight Range: 249 lb 3.2 oz (113 kg) Body mass index is 33.8 kg/m.   Vital Signs:   Temp:  [97.8 F (36.6 C)-98.3 F (36.8 C)] 97.8 F (36.6 C) (10/25 0811) Pulse Rate:  [89-120] 96 (10/25 0811) Resp:  [14-27] 22 (10/25 0811) BP: (110-127)/(77-89) 112/87 (10/25 0811) SpO2:  [93 %-100 %] 93 % (10/25 0811) Weight:  [249 lb 3.2 oz (113 kg)] 249 lb 3.2 oz (113 kg) (10/25 0306) Last BM Date: 11/07/16  Weight change: Filed Weights   11/07/16 0910 11/08/16 0400 11/09/16 0306  Weight: 257 lb 12.8 oz (116.9 kg) 256 lb 8 oz (116.3 kg) 249 lb 3.2 oz (113 kg)    Intake/Output:   Intake/Output Summary (Last 24 hours) at 11/09/16 1023 Last data filed at 11/09/16 0812  Gross per 24 hour  Intake          1571.86 ml  Output             5375 ml  Net         -3803.14 ml      Physical Exam   General:  Fatigued appearing. No resp difficulty HEENT: normal Neck: supple. no JVP to jaw. Carotids 2+ bilat; no bruits. No lymphadenopathy or thryomegaly appreciated. Cor: PMI laterally displaced. Tachy regular. No rubs, or murmurs. + S3  Lungs: clear Abdomen: soft, nontender, nondistended. No hepatosplenomegaly. No bruits or masses. Good bowel sounds. Extremities: no cyanosis, clubbing, rash, Rand LLE 2+ edema. RUE PICC  Neuro: alert & orientedx3, cranial nerves grossly intact. moves all 4 extremities w/o difficulty. Affect pleasant    Telemetry   Sinus Tach 100s with PVCs. Personally reviewed   EKG   Admit Sinus Tach 117 on admit.    Labs    CBC  Recent Labs  11/08/16 0219 11/09/16 0353  WBC 14.0* 13.0*  HGB 12.4* 12.5*  HCT 36.7* 37.2*  MCV 85.2 85.9  PLT 264 255   Basic Metabolic Panel  Recent Labs   11/08/16 0219 11/09/16 0353  NA 132* 133*  K 3.8 4.9  CL 101 101  CO2 21* 23  GLUCOSE 133* 136*  BUN 44* 39*  CREATININE 1.61* 1.38*  CALCIUM 7.7* 7.9*   Liver Function Tests  Recent Labs  11/07/16 0530  AST 76*  ALT 32  ALKPHOS 161*  BILITOT 2.7*  PROT 7.5  ALBUMIN 1.4*   No results for input(s): LIPASE, AMYLASE in the last 72 hours. Cardiac Enzymes No results for input(s): CKTOTAL, CKMB, CKMBINDEX, TROPONINI in the last 72 hours.  BNP: BNP (last 3 results)  Recent Labs  11/06/16 1621  BNP 961.6*    ProBNP (last 3 results) No results for input(s): PROBNP in the last 8760 hours.   D-Dimer No results for input(s): DDIMER in the last 72 hours. Hemoglobin A1C No results for input(s): HGBA1C in the last 72 hours. Fasting Lipid Panel  Recent Labs  11/08/16 0219  CHOL 69  HDL 16*  LDLCALC 42  TRIG 56  CHOLHDL 4.3   Thyroid Function Tests  Recent Labs  11/06/16 2240 11/07/16 0931  TSH 8.439*  --   T3FREE  --  1.6*    Other results:   Imaging    No results found.     Medications:     Scheduled Medications: . aspirin EC  81 mg Oral Daily  . digoxin  0.125 mg Oral Daily  . furosemide  80 mg Intravenous Q12H  . hydrALAZINE  37.5 mg Oral Q8H  . isosorbide mononitrate  30 mg Oral Daily  . sodium chloride flush  10-40 mL Intracatheter Q12H  . sodium chloride flush  3 mL Intravenous Q12H    Infusions: . sodium chloride    . heparin 1,800 Units/hr (11/09/16 6226)    PRN Medications: sodium chloride, acetaminophen, ondansetron (ZOFRAN) IV, sodium chloride flush, sodium chloride flush    Patient Profile  Benjamin Sherman is a 46 y.o. male with no previous medical history  Presented with worsening SOB and LE edema. New diagnosis of systolic CHF  Assessment/Plan   1. 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 Possible thrombus, CMRI completed.   Plan for RHC/LCH on Friday.  - Volume status remains elevated but he  continues to diurese briskly with IV lasix. Continue current dose.  -Continue to diurese with IV lasix 80 mg twice a daily.  - No ACE/ARB for now with rising creatinine - No BB with likely low output.   - Continue digoxin 0.125 mg daily.  - Increase hydralazine to 50 mg three times a day.  - Continue imdur 30 mg daily - Hold off on spiro because K 4.9  - HIV non reactive.  - Add unna boots.   2. ID - ? Infectious component with vague history of viral URI.  - CXR concerning for PNA. Given one dose ABX.  -WBC trending down.   3. 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  - Creatinine trending down 1.8>1.6 >1.3  4. Hypothyroidism - TSH 8.4 on admit ( Ref Range 0.350 - 4.500) - T3 1.6 T4 within normal range.    5. LV thrombus -Continue heparin.  MRI completed.     LHC on Friday.    HF Pharmacy will meet with him tomorrow to discuss HF medications.    Length of Stay: 3   Amy Clegg, NP  11/09/2016, 10:23 AM  Advanced Heart Failure Team Pager [phone] (M-F; 7a - 4p)  Please contact [facility] for night-coverage after hours (4p -7a ) and weekends on [email or web address]  Patient seen and examined with Tonye Becket, NP. We discussed all aspects of the encounter. I agree with the assessment and plan as stated above.   Remains tenuous, Continues to diurese but still volume overloaded. Remains tachycardic. BP soft. Will titrate HF meds as tolerated. Continue IV diuresis. CMRI done today - await results. Continue heparin for LV clot. D/w PharmD, Will plan R/L cath tomorrow. Will need coumadin load prior to d/c due to fresh LV clot  Bensimhon, Reuel Boom, MD  11:33 AM

## 2016-11-10 NOTE — Progress Notes (Signed)
Site area: Right brachial a 5 french venous sheath was removed  Site Prior to Removal:  Level 0  Pressure Applied For 10 MINUTES    Bedrest Beginning at 0910  Manual:   Yes.    Patient Status During Pull:  stable  Post Pull Groin Site:  Level 0  Post Pull Instructions Given:  Yes.    Post Pull Pulses Present:  Yes.    Dressing Applied:  Yes.    Comments:  VS remain stable

## 2016-11-10 NOTE — Progress Notes (Signed)
ANTICOAGULATION CONSULT NOTE   Pharmacy Consult for heparin/warfarin Indication: Lv Thrombus  No Known Allergies  Patient Measurements: Height: 6' (182.9 cm) Weight: 243 lb 4.8 oz (110.4 kg) IBW/kg (Calculated) : 77.6 Heparin Dosing Weight: 103 kg  Vital Signs: Temp: 97.7 F (36.5 C) (10/26 0425) Temp Source: Oral (10/26 0425) BP: 128/84 (10/26 0905) Pulse Rate: 103 (10/26 0905)  Labs:  Recent Labs  11/08/16 0219 11/08/16 0824 11/09/16 0353 11/09/16 1430 11/09/16 1816 11/10/16 0236  HGB 12.4*  --  12.5* 11.9*  --   --   HCT 36.7*  --  37.2* 35.8*  --   --   PLT 264  --  255 260  --   --   LABPROT  --   --   --   --  15.8*  --   INR  --   --   --   --  1.27  --   HEPARINUNFRC 0.28* 0.33 0.37  --   --   --   CREATININE 1.61*  --  1.38*  --   --  1.20    Estimated Creatinine Clearance: 96.6 mL/min (by C-G formula based on SCr of 1.2 mg/dL).   Medical History: History reviewed. No pertinent past medical history.   Assessment: 61 yoM admitted with ADHF found to have likely LV thrombus on preliminary ECHO confirmed via cardiac MRI. Pt started on heparin and now to begin warfarin load. Pt had heart cath this morning, to resume heparin 8-hr after sheath removal. Heparin levels previously therapeutic and stable at 1800 units/hr.  Goal of Therapy:  Heparin level 0.3-0.7 units/ml Monitor platelets by anticoagulation protocol: Yes   Plan:  -Warfarin 7.5mg  PO x1 tonight -Resume IV heparin at 1800 units/hr with no bolus at 1700 -Check 6-hr heparin level -Monitor daily heparin level, CBC, S/Sx bleeding  Fredonia Highland, PharmD PGY-2 Cardiology Pharmacy Resident Pager: [phone] 11/10/2016

## 2016-11-10 NOTE — Progress Notes (Signed)
Advanced Heart Failure Rounding Note  PCP:  Primary Cardiologist:   Subjective:    Diuresing well. Breathing better but still volume overloaded.   cMRI results from 10/24 reviewed with him:  1. Mildly dilated left ventricle with severe diffuse hypokinesis, EF 22%. 2. There are thrombi noted along the mid-apical anteroseptal and anterior walls as well as the apical inferior wall. 3.  Mildly dilated RV with moderate to severe hypokinesis. 4. There is a pattern of diffuse subendocardial < 50% wall thickness LGE along the mid to apical anterior and anteroseptal wall, the apex, the apical lateral wall, and the apical inferior wall. This could be due to coronary disease within LAD territory but the diffuse nature of the LGE and the multiple thrombi do raise my concern for the possibility of Eosinophilic myocarditis. If this is coronary disease, < 50% wall thickness LGE suggests that the hypokinetic territories may be viable.  Objective:   Weight Range: 110.4 kg (243 lb 4.8 oz) Body mass index is 33 kg/m.   Vital Signs:   Temp:  [97.7 F (36.5 C)-98.7 F (37.1 C)] 97.7 F (36.5 C) (10/26 0425) Pulse Rate:  [108-113] 113 (10/26 0425) Resp:  [18-22] 22 (10/26 0457) BP: (108-113)/(76-82) 111/80 (10/26 0425) SpO2:  [96 %-100 %] 97 % (10/26 0425) Weight:  [110.4 kg (243 lb 4.8 oz)] 110.4 kg (243 lb 4.8 oz) (10/26 0425) Last BM Date: 11/08/16  Weight change: Filed Weights   11/08/16 0400 11/09/16 0306 11/10/16 0425  Weight: 116.3 kg (256 lb 8 oz) 113 kg (249 lb 3.2 oz) 110.4 kg (243 lb 4.8 oz)    Intake/Output:   Intake/Output Summary (Last 24 hours) at 11/10/16 0831 Last data filed at 11/10/16 0452  Gross per 24 hour  Intake            163.2 ml  Output             4290 ml  Net          -4126.8 ml      Physical Exam   General:  Fatigued appearing. No resp difficulty HEENT: normal Neck: supple. JVP to ear. Carotids 2+ bilat; no bruits. No lymphadenopathy or  thryomegaly appreciated. Cor: PMI laterally displaced. Regular rate & rhythm. +s3. Lungs: clear Abdomen: soft, nontender, nondistended. No hepatosplenomegaly. No bruits or masses. Good bowel sounds. Extremities: no cyanosis, clubbing, rash, 2+ edema Neuro: alert & orientedx3, cranial nerves grossly intact. moves all 4 extremities w/o difficulty. Affect pleasant    Telemetry   Sinus Tach 100-110s with PVCs. Personally reviewed   EKG   Admit Sinus Tach 117 on admit.    Labs    CBC  Recent Labs  11/09/16 0353 11/09/16 1430  WBC 13.0* 11.8*  NEUTROABS  --  9.4*  HGB 12.5* 11.9*  HCT 37.2* 35.8*  MCV 85.9 85.6  PLT 255 260   Basic Metabolic Panel  Recent Labs  11/09/16 0353 11/10/16 0236  NA 133* 134*  K 4.9 3.5  CL 101 98*  CO2 23 26  GLUCOSE 136* 178*  BUN 39* 30*  CREATININE 1.38* 1.20  CALCIUM 7.9* 8.0*   Liver Function Tests No results for input(s): AST, ALT, ALKPHOS, BILITOT, PROT, ALBUMIN in the last 72 hours. No results for input(s): LIPASE, AMYLASE in the last 72 hours. Cardiac Enzymes No results for input(s): CKTOTAL, CKMB, CKMBINDEX, TROPONINI in the last 72 hours.  BNP: BNP (last 3 results)  Recent Labs  11/06/16 1621  BNP 961.6*  ProBNP (last 3 results) No results for input(s): PROBNP in the last 8760 hours.   D-Dimer No results for input(s): DDIMER in the last 72 hours. Hemoglobin A1C No results for input(s): HGBA1C in the last 72 hours. Fasting Lipid Panel  Recent Labs  11/08/16 0219  CHOL 69  HDL 16*  LDLCALC 42  TRIG 56  CHOLHDL 4.3   Thyroid Function Tests  Recent Labs  11/07/16 0931  T3FREE 1.6*    Other results:   Imaging    No results found.   Medications:     Scheduled Medications: . [MAR Hold] aspirin EC  81 mg Oral Daily  . [MAR Hold] digoxin  0.125 mg Oral Daily  . [MAR Hold] furosemide  80 mg Intravenous Q12H  . [MAR Hold] hydrALAZINE  50 mg Oral Q8H  . [MAR Hold] isosorbide mononitrate   30 mg Oral Daily  . potassium chloride  40 mEq Oral BID  . [MAR Hold] sodium chloride flush  10-40 mL Intracatheter Q12H  . [MAR Hold] sodium chloride flush  3 mL Intravenous Q12H  . sodium chloride flush  3 mL Intravenous Q12H    Infusions: . [MAR Hold] sodium chloride    . sodium chloride    . sodium chloride 10 mL/hr at 11/10/16 0604  . heparin Stopped (11/10/16 0569)    PRN Medications: [MAR Hold] sodium chloride, sodium chloride, [MAR Hold] acetaminophen, fentaNYL, heparin, lidocaine, midazolam, [MAR Hold] ondansetron (ZOFRAN) IV, Radial Cocktail/Verapamil only, [MAR Hold] sodium chloride flush, [MAR Hold] sodium chloride flush, sodium chloride flush    Patient Profile  Benjamin Sherman is a 46 y.o. male with no previous medical history  Presented with worsening SOB and LE edema. New diagnosis of systolic CHF  Assessment/Plan   1. 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 Possible thrombus, CMRI completed with EF 22% component of iCM +/- eosinophilic CM. I doubt iCM.    - Volume status remains elevated but he continues to diurese briskly with IV lasix. Continue current dose.  - No ACE/ARB until after cath - No BB with likely low output.   - Continue digoxin 0.125 mg daily.  - Continue hydralazine 50 mg three times a day.  - Continue imdur 30 mg daily - Start spiro after cath  - HIV non reactive.  - Continue unna boots.  - R/L cath today discussed at length  2. ID - ? Infectious component with vague history of viral URI.  - CXR concerning for PNA. Given one dose ABX.  -WBC trending down.   3. 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  - Creatinine trending down 1.8>1.6 >1.3> 1.2 with diuresis  4. Hypothyroidism - TSH 8.4 on admit ( Ref Range 0.350 - 4.500) - T3 1.6 T4 within normal range.    5. LV thrombus -Continue heparin.  MRI completed.   -Start coumadin after cath     Length of Stay: 4   [name], MD  11/10/2016, 8:31  AM  Advanced Heart Failure Team Pager [phone] (M-F; 7a - 4p)  Please contact [facility] for night-coverage after hours (4p -7a ) and weekends on [email or web address]

## 2016-11-10 NOTE — Progress Notes (Signed)
CARDIAC REHAB PHASE I   PRE:  Rate/Rhythm: 106 ST  BP:  Sitting: 106/75      SaO2: 99% RA  MODE:  Ambulation: 470  ft   POST:  Rate/Rhythm: 119 ST   BP:  Sitting: 110/88      SaO2: 99% RA  Pt ambulated 470 ft with no complaints of CP and SOB. Pt returned to recliner after walk with call bell. Pt states he is feeling great and was eager to walk. Pt completed with pt. Reviewed signs and symptoms of CHF, exercise guidelines, low sodium diet, daily weights and Cardiac Rehab maintenance program. Pt given brochure on rehab maintenance program, instructed to call when he was ready to join program.   [phone]  York Cerise MS, ACSM CEP  2:19 PM 11/10/2016

## 2016-11-10 NOTE — Care Management (Signed)
1242 11-10-16 Tomi Bamberger, RN,BSN [phone] Pt is without Insurance at this time. Pt will have to call the [facility] on Monday of next week for hospital f/u appointment if he is stable for d/c over the weekend. No appointments available at the Clinic @ this time. Pt will be able to utilize the [facility] M-F until 4:15 pm cost ranging from $4.00-$10.00. Pt will need to get established at the clinic if the pharmacy is utilized. If pt can not get to the [facility] then pt will need to utilize [facility] for the generic medication list that starts at $4.00. Per RN pt will not be d/c home today- Heart Failure following and pt continues on IV Lasix. No further needs from CM at this time.

## 2016-11-10 NOTE — Discharge Instructions (Signed)

## 2016-11-11 LAB — CULTURE, BLOOD (ROUTINE X 2)
Culture: NO GROWTH
Culture: NO GROWTH
Special Requests: ADEQUATE
Special Requests: ADEQUATE

## 2016-11-11 LAB — BASIC METABOLIC PANEL
Anion gap: 8 (ref 5–15)
BUN: 23 mg/dL — ABNORMAL HIGH (ref 6–20)
CO2: 29 mmol/L (ref 22–32)
Calcium: 8.4 mg/dL — ABNORMAL LOW (ref 8.9–10.3)
Chloride: 97 mmol/L — ABNORMAL LOW (ref 101–111)
Creatinine, Ser: 1.18 mg/dL (ref 0.61–1.24)
GFR calc Af Amer: >60 mL/min (ref >60)
GFR calc non Af Amer: >60 mL/min (ref >60)
Glucose, Bld: 116 mg/dL — ABNORMAL HIGH (ref 65–99)
Potassium: 3.9 mmol/L (ref 3.5–5.1)
Sodium: 134 mmol/L — ABNORMAL LOW (ref 135–145)

## 2016-11-11 LAB — CBC
HCT: 37.2 % — ABNORMAL LOW (ref 39.0–52.0)
Hemoglobin: 12.2 g/dL — ABNORMAL LOW (ref 13.0–17.0)
MCH: 28.4 pg (ref 26.0–34.0)
MCHC: 32.8 g/dL (ref 30.0–36.0)
MCV: 86.7 fL (ref 78.0–100.0)
Platelets: 258 10*3/uL (ref 150–400)
RBC: 4.29 MIL/uL (ref 4.22–5.81)
RDW: 16.2 % — ABNORMAL HIGH (ref 11.5–15.5)
WBC: 9.3 10*3/uL (ref 4.0–10.5)

## 2016-11-11 LAB — COOXEMETRY PANEL
Carboxyhemoglobin: 1.6 % — ABNORMAL HIGH (ref 0.5–1.5)
Methemoglobin: 0.8 % (ref 0.0–1.5)
O2 Saturation: 47.2 %
Total hemoglobin: 12.6 g/dL (ref 12.0–16.0)

## 2016-11-11 LAB — PROTIME-INR
INR: 1.24
Prothrombin Time: 15.5 seconds — ABNORMAL HIGH (ref 11.4–15.2)

## 2016-11-11 LAB — HEPARIN LEVEL (UNFRACTIONATED): Heparin Unfractionated: 0.26 IU/mL — ABNORMAL LOW (ref 0.30–0.70)

## 2016-11-11 MED ORDER — LOSARTAN POTASSIUM 25 MG PO TABS
12.5000 mg | ORAL_TABLET | Freq: Every day | ORAL | Status: DC
  Administered 2016-11-11 – 2016-11-13 (×3): 12.5 mg via ORAL
  Filled 2016-11-11 (×3): qty 1

## 2016-11-11 MED ORDER — SODIUM CHLORIDE 0.9 % IV SOLN
1000.0000 mg | INTRAVENOUS | Status: AC
  Administered 2016-11-11 – 2016-11-15 (×5): 1000 mg via INTRAVENOUS
  Filled 2016-11-11 (×5): qty 8

## 2016-11-11 MED ORDER — TORSEMIDE 20 MG PO TABS: 40.0000 mg | ORAL_TABLET | Freq: Every day | ORAL | Status: DC

## 2016-11-11 MED ORDER — WARFARIN SODIUM 10 MG PO TABS: 10.0000 mg | ORAL_TABLET | Freq: Once | ORAL | Status: DC

## 2016-11-11 MED ORDER — FUROSEMIDE 10 MG/ML IJ SOLN
80.0000 mg | Freq: Two times a day (BID) | INTRAMUSCULAR | Status: DC
  Administered 2016-11-11 – 2016-11-14 (×7): 80 mg via INTRAVENOUS
  Filled 2016-11-11 (×6): qty 8

## 2016-11-11 MED ORDER — SPIRONOLACTONE 25 MG PO TABS
12.5000 mg | ORAL_TABLET | Freq: Every day | ORAL | Status: DC
  Administered 2016-11-11 – 2016-11-14 (×4): 12.5 mg via ORAL
  Filled 2016-11-11 (×4): qty 1

## 2016-11-11 NOTE — Progress Notes (Signed)
CARDIAC REHAB PHASE I   PRE:  Rate/Rhythm: 113 ST with PVC's  BP:  Supine:   Sitting: 92/79  Standing:    SaO2: 100 RA  MODE:  Ambulation: 790 ft   POST:  Rate/Rhythm: 123 ST with PVC'S  BP:  Supine:   Sitting: 113/80  Standing:     SaO2: 98 RA 1040-1115 Assisted X 1 to ambulate. Gait steady. Pt able to walk 790 feet without c/o. VS stable. Pt to recliner after walk with call light in reach. I strongly encourage him to keep his feet elevated whether he in bed or recliner.He states that he is going to watch the CHF video today. Will send referral to [facility] for Outpt. CRP to see if he could get in.  Melina Copa RN 11/11/2016 11:24 AM

## 2016-11-11 NOTE — Progress Notes (Addendum)
Advanced Heart Failure Rounding Note  PCP:  Primary Cardiologist:   Subjective:    Feels better. No orthopnea or PND. Weight down another 5 pounds (19 pounds total)  Cath yesterday with normal coronaries with elevated filling pressures R>L. CI = 2.2 l/min/m2  CVP 14 co-ox 47%  cMRI results from 10/24 reviewed with him:  1. Mildly dilated left ventricle with severe diffuse hypokinesis, EF 22%. 2. There are thrombi noted along the mid-apical anteroseptal and anterior walls as well as the apical inferior wall. 3.  Mildly dilated RV with moderate to severe hypokinesis. 4. There is a pattern of diffuse subendocardial < 50% wall thickness LGE along the mid to apical anterior and anteroseptal wall, the apex, the apical lateral wall, and the apical inferior wall. This could be due to coronary disease within LAD territory but the diffuse nature of the LGE and the multiple thrombi do raise my concern for the possibility of Eosinophilic myocarditis. If this is coronary disease, < 50% wall thickness LGE suggests that the hypokinetic territories may be viable.  Objective:   Weight Range: 108 kg (238 lb 3 oz) Body mass index is 32.3 kg/m.   Vital Signs:   Temp:  [97.8 F (36.6 C)-98.4 F (36.9 C)] 98.4 F (36.9 C) (10/27 0825) Pulse Rate:  [73-116] 116 (10/27 0825) Resp:  [11-29] 16 (10/27 0825) BP: (99-137)/(66-95) 112/94 (10/27 0825) SpO2:  [98 %-100 %] 98 % (10/27 0825) Weight:  [108 kg (238 lb 3 oz)] 108 kg (238 lb 3 oz) (10/27 0348) Last BM Date: 11/10/16  Weight change: Filed Weights   11/09/16 0306 11/10/16 0425 11/11/16 0348  Weight: 113 kg (249 lb 3.2 oz) 110.4 kg (243 lb 4.8 oz) 108 kg (238 lb 3 oz)    Intake/Output:   Intake/Output Summary (Last 24 hours) at 11/11/16 0843 Last data filed at 11/11/16 0826  Gross per 24 hour  Intake           569.65 ml  Output             2400 ml  Net         -1830.35 ml      Physical Exam   General:  Sitting in bed  No resp difficulty HEENT: normal Neck: supple. JVP to jaw. Carotids 2+ bilat; no bruits. No lymphadenopathy or thryomegaly appreciated. Cor: PMI laterally displaced. Regular rate & rhythm. No rubs, gallops or murmurs. Lungs: clear Abdomen: soft, nontender, nondistended. No hepatosplenomegaly. No bruits or masses. Good bowel sounds. Extremities: no cyanosis, clubbing, rash, UNNA boots with 2+ edema LUE PICC Neuro: alert & orientedx3, cranial nerves grossly intact. moves all 4 extremities w/o difficulty. Affect pleasant   Telemetry   Sinus Tach 100-110s with PVCs. Personally reviewed     EKG   Admit Sinus Tach 117 on admit.    Labs    CBC  Recent Labs  11/09/16 1430 11/11/16 0333  WBC 11.8* 9.3  NEUTROABS 9.4*  --   HGB 11.9* 12.2*  HCT 35.8* 37.2*  MCV 85.6 86.7  PLT 260 258   Basic Metabolic Panel  Recent Labs  11/10/16 0236 11/11/16 0333  NA 134* 134*  K 3.5 3.9  CL 98* 97*  CO2 26 29  GLUCOSE 178* 116*  BUN 30* 23*  CREATININE 1.20 1.18  CALCIUM 8.0* 8.4*   Liver Function Tests No results for input(s): AST, ALT, ALKPHOS, BILITOT, PROT, ALBUMIN in the last 72 hours. No results for input(s): LIPASE, AMYLASE in the  last 72 hours. Cardiac Enzymes No results for input(s): CKTOTAL, CKMB, CKMBINDEX, TROPONINI in the last 72 hours.  BNP: BNP (last 3 results)  Recent Labs  11/06/16 1621  BNP 961.6*    ProBNP (last 3 results) No results for input(s): PROBNP in the last 8760 hours.   D-Dimer No results for input(s): DDIMER in the last 72 hours. Hemoglobin A1C No results for input(s): HGBA1C in the last 72 hours. Fasting Lipid Panel No results for input(s): CHOL, HDL, LDLCALC, TRIG, CHOLHDL, LDLDIRECT in the last 72 hours. Thyroid Function Tests No results for input(s): TSH, T4TOTAL, T3FREE, THYROIDAB in the last 72 hours.  Invalid input(s): FREET3  Other results:   Imaging    No results found.   Medications:     Scheduled  Medications: . aspirin EC  81 mg Oral Daily  . digoxin  0.125 mg Oral Daily  . furosemide  80 mg Intravenous Q12H  . hydrALAZINE  50 mg Oral Q8H  . isosorbide mononitrate  30 mg Oral Daily  . sodium chloride flush  10-40 mL Intracatheter Q12H  . sodium chloride flush  3 mL Intravenous Q12H  . sodium chloride flush  3 mL Intravenous Q12H  . warfarin  10 mg Oral ONCE-1800  . Warfarin - Pharmacist Dosing Inpatient   Does not apply q1800    Infusions: . sodium chloride    . sodium chloride    . heparin 2,100 Units/hr (11/11/16 0626)    PRN Medications: sodium chloride, sodium chloride, acetaminophen, ondansetron (ZOFRAN) IV, sodium chloride flush, sodium chloride flush, sodium chloride flush    Patient Profile  Benjamin Sherman is a 46 y.o. male with no previous medical history  Presented with worsening SOB and LE edema. New diagnosis of systolic CHF  Assessment/Plan   1. 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 Possible thrombus, CMRI completed with EF 22% component of iCM +/- eosinophilic CM.  - Cath 10/26 with normal cors. 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 No eos on CBC but this does not exclude. With tenuous course will start empiric steroids. I d/w Dr. Shirlee Latch who read MRI as well. No evidence of strongyloides to require testing prior to starting steroids.  - Remains very tenuous. Co-ox 47%. Need to follow closely. If continues to deteriorate may need to start inotropes and consider advanced therapies (not candidate for VAD currently with biventricular HF) - Volume status improving but CVP still up (14)  Continue IV lasix - Start low-dose losartan and spiro as BP tolerate - No BB with low output.   - Continue digoxin 0.125 mg daily.  - Continue hydralazine 50 mg three times a day.  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  - Creatinine  trending down 1.8>1.6 >1.3> 1.2> 1.18 with diuresis  3. Hypothyroidism - TSH 8.4 on admit ( Ref Range 0.350 - 4.500) - T3 1.6 T4 within normal range.    4. LV thrombus -Continue heparin.  - Will hold on coumadin until more stable    Length of Stay: 5   [name], MD  11/11/2016, 8:43 AM  Advanced Heart Failure Team Pager [phone] (M-F; 7a - 4p)  Please contact [facility] for night-coverage after hours (4p -7a ) and weekends on [email or web address]

## 2016-11-11 NOTE — Progress Notes (Signed)
ANTICOAGULATION CONSULT NOTE   Pharmacy Consult for heparin/warfarin Indication: LV thrombus  No Known Allergies  Patient Measurements: Height: 6' (182.9 cm) Weight: 238 lb 3 oz (108 kg) IBW/kg (Calculated) : 77.6 Heparin Dosing Weight: 103 kg  Vital Signs: Temp: 97.8 F (36.6 C) (10/27 0300) Temp Source: Oral (10/27 0300) BP: 108/75 (10/27 0536) Pulse Rate: 111 (10/27 0300)  Labs:  Recent Labs  11/09/16 0353 11/09/16 1430 11/09/16 1816 11/10/16 0236 11/10/16 2237 11/11/16 0333 11/11/16 0524  HGB 12.5* 11.9*  --   --   --  12.2*  --   HCT 37.2* 35.8*  --   --   --  37.2*  --   PLT 255 260  --   --   --  258  --   LABPROT  --   --  15.8*  --   --   --  15.5*  INR  --   --  1.27  --   --   --  1.24  HEPARINUNFRC 0.37  --   --   --  0.29*  --  0.26*  CREATININE 1.38*  --   --  1.20  --  1.18  --     Estimated Creatinine Clearance: 97.2 mL/min (by C-G formula based on SCr of 1.18 mg/dL).  Assessment: 46 y.o. male with LV thrombus for heparin  Goal of Therapy:  Heparin level 0.3-0.7 units/ml Monitor platelets by anticoagulation protocol: Yes   Plan:  Increase Heparin 2100 units/hr Coumadin 10 mg today  Geannie Risen, PharmD, BCPS  11/11/2016

## 2016-11-12 DIAGNOSIS — I401 Isolated myocarditis: Secondary | ICD-10-CM

## 2016-11-12 LAB — BASIC METABOLIC PANEL
Anion gap: 12 (ref 5–15)
BUN: 28 mg/dL — ABNORMAL HIGH (ref 6–20)
CO2: 24 mmol/L (ref 22–32)
Calcium: 8.1 mg/dL — ABNORMAL LOW (ref 8.9–10.3)
Chloride: 96 mmol/L — ABNORMAL LOW (ref 101–111)
Creatinine, Ser: 1.42 mg/dL — ABNORMAL HIGH (ref 0.61–1.24)
GFR calc Af Amer: >60 mL/min (ref >60)
GFR calc non Af Amer: 57 mL/min — ABNORMAL LOW (ref >60)
Glucose, Bld: 516 mg/dL (ref 65–99)
Potassium: 3.6 mmol/L (ref 3.5–5.1)
Sodium: 132 mmol/L — ABNORMAL LOW (ref 135–145)

## 2016-11-12 LAB — HEPARIN LEVEL (UNFRACTIONATED): Heparin Unfractionated: 0.41 IU/mL (ref 0.30–0.70)

## 2016-11-12 LAB — CBC
HCT: 36.3 % — ABNORMAL LOW (ref 39.0–52.0)
Hemoglobin: 12.1 g/dL — ABNORMAL LOW (ref 13.0–17.0)
MCH: 28.9 pg (ref 26.0–34.0)
MCHC: 33.3 g/dL (ref 30.0–36.0)
MCV: 86.6 fL (ref 78.0–100.0)
Platelets: 252 10*3/uL (ref 150–400)
RBC: 4.19 MIL/uL — ABNORMAL LOW (ref 4.22–5.81)
RDW: 16.2 % — ABNORMAL HIGH (ref 11.5–15.5)
WBC: 15.7 10*3/uL — ABNORMAL HIGH (ref 4.0–10.5)

## 2016-11-12 LAB — COOXEMETRY PANEL
Carboxyhemoglobin: 1.9 % — ABNORMAL HIGH (ref 0.5–1.5)
Methemoglobin: 0.5 % (ref 0.0–1.5)
O2 Saturation: 62.9 %
Total hemoglobin: 10.9 g/dL — ABNORMAL LOW (ref 12.0–16.0)

## 2016-11-12 LAB — PROTIME-INR
INR: 1.16
Prothrombin Time: 14.7 seconds (ref 11.4–15.2)

## 2016-11-12 LAB — GLUCOSE, CAPILLARY: Glucose-Capillary: 460 mg/dL — ABNORMAL HIGH (ref 65–99)

## 2016-11-12 MED ORDER — WARFARIN SODIUM 10 MG PO TABS
10.0000 mg | ORAL_TABLET | Freq: Once | ORAL | Status: AC
  Administered 2016-11-12: 10 mg via ORAL
  Filled 2016-11-12: qty 1

## 2016-11-12 MED ORDER — INSULIN ASPART 100 UNIT/ML ~~LOC~~ SOLN
0.0000 [IU] | Freq: Every day | SUBCUTANEOUS | Status: DC
  Administered 2016-11-12: 5 [IU] via SUBCUTANEOUS

## 2016-11-12 MED ORDER — INSULIN ASPART 100 UNIT/ML ~~LOC~~ SOLN
0.0000 [IU] | Freq: Three times a day (TID) | SUBCUTANEOUS | Status: DC
  Administered 2016-11-13: 15 [IU] via SUBCUTANEOUS
  Administered 2016-11-13: 8 [IU] via SUBCUTANEOUS

## 2016-11-12 MED ORDER — WARFARIN - PHARMACIST DOSING INPATIENT: Freq: Every day | Status: DC

## 2016-11-12 MED ORDER — MENTHOL 3 MG MT LOZG
1.0000 | LOZENGE | OROMUCOSAL | Status: DC | PRN
  Filled 2016-11-12: qty 9

## 2016-11-12 NOTE — Progress Notes (Signed)
ANTICOAGULATION CONSULT NOTE   Pharmacy Consult for heparin/warfarin Indication: Lv Thrombus  No Known Allergies  Patient Measurements: Height: 6' (182.9 cm) Weight: 232 lb 14.4 oz (105.6 kg) IBW/kg (Calculated) : 77.6 Heparin Dosing Weight: 103 kg  Vital Signs:    Labs:  Recent Labs  11/10/16 0236 11/10/16 2237 11/11/16 0333 11/11/16 0524 11/12/16 1626  HGB  --   --  12.2*  --  12.1*  HCT  --   --  37.2*  --  36.3*  PLT  --   --  258  --  252  LABPROT  --   --   --  15.5* 14.7  INR  --   --   --  1.24 1.16  HEPARINUNFRC  --  0.29*  --  0.26* 0.41  CREATININE 1.20  --  1.18  --  1.42*    Estimated Creatinine Clearance: 79.9 mL/min (A) (by C-G formula based on SCr of 1.42 mg/dL (H)).   Medical History: History reviewed. No pertinent past medical history.   Assessment: 54 yoM admitted with ADHF found to have likely LV thrombus on preliminary ECHO confirmed via cardiac MRI. Pt started on heparin and warfarin.  Heparin level of 0.41 is therapeutic on heparin infusion at 2100 units/hr. INR is 1.16.  Goal of Therapy:  INR 2-3 Heparin level 0.3-0.7 units/ml Monitor platelets by anticoagulation protocol: Yes   Plan:  1. Continue heparin infusion at 2100 units/hr  2. Warfarin 10 mg x 1 this evening  3. Daily heparin level and CBC   Pollyann Samples, PharmD, BCPS 11/12/2016, 6:24 PM

## 2016-11-12 NOTE — Progress Notes (Addendum)
Advanced Heart Failure Rounding Note  PCP:  Primary Cardiologist:   Subjective:    Remains on IV lasix. Weight down another 6 pounds (25 pounds total). 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 Co-ox 47%-> 63%. BMET pending.   Feeling better. No CP or SOB. Walked 790 feet with CR yesterday  Cath 10/26 with normal coronaries with elevated filling pressures R>L. CI = 2.2 l/min/m2   cMRI results from 10/24 reviewed with him:  1. Mildly dilated left ventricle with severe diffuse hypokinesis, EF 22%. 2. There are thrombi noted along the mid-apical anteroseptal and anterior walls as well as the apical inferior wall. 3.  Mildly dilated RV with moderate to severe hypokinesis. 4. There is a pattern of diffuse subendocardial < 50% wall thickness LGE along the mid to apical anterior and anteroseptal wall, the apex, the apical lateral wall, and the apical inferior wall. This could be due to coronary disease within LAD territory but the diffuse nature of the LGE and the multiple thrombi do raise my concern for the possibility of Eosinophilic myocarditis. If this is coronary disease, < 50% wall thickness LGE suggests that the hypokinetic territories may be viable.  Objective:   Weight Range: 105.6 kg (232 lb 14.4 oz) Body mass index is 31.59 kg/m.   Vital Signs:   Temp:  [98.3 F (36.8 C)-98.7 F (37.1 C)] 98.3 F (36.8 C) (10/28 0500) Pulse Rate:  [105-123] 106 (10/28 0500) Resp:  [18-30] 18 (10/28 0500) BP: (104-110)/(71-84) 106/84 (10/28 0500) SpO2:  [97 %-100 %] 100 % (10/28 0500) Weight:  [105.6 kg (232 lb 14.4 oz)] 105.6 kg (232 lb 14.4 oz) (10/28 0500) Last BM Date: 11/10/16  Weight change: Filed Weights   11/10/16 0425 11/11/16 0348 11/12/16 0500  Weight: 110.4 kg (243 lb 4.8 oz) 108 kg (238 lb 3 oz) 105.6 kg (232 lb 14.4 oz)    Intake/Output:   Intake/Output Summary (Last 24 hours) at 11/12/16 0841 Last data filed at  11/12/16 0600  Gross per 24 hour  Intake          1578.13 ml  Output             3800 ml  Net         -2221.87 ml      Physical Exam   cvp 13 General: lying in bed. No resp difficulty HEENT: normal Neck: supple. jvp to jaw Carotids 2+ bilat; no bruits. No lymphadenopathy or thryomegaly appreciated. Cor: PMI laterally displaced. Regular rate & rhythm. +s3 Lungs: clear Abdomen: soft, nontender, nondistended. No hepatosplenomegaly. No bruits or masses. Good bowel sounds. Extremities: no cyanosis, clubbing, rash, 1+ edema unna boots Neuro: alert & orientedx3, cranial nerves grossly intact. moves all 4 extremities w/o difficulty. Affect pleasant   Telemetry   Sinus Tach 100-110 with PVCs. Personally reviewed     EKG   Admit Sinus Tach 117 on admit.    Labs    CBC  Recent Labs  11/09/16 1430 11/11/16 0333  WBC 11.8* 9.3  NEUTROABS 9.4*  --   HGB 11.9* 12.2*  HCT 35.8* 37.2*  MCV 85.6 86.7  PLT 260 258   Basic Metabolic Panel  Recent Labs  11/10/16 0236 11/11/16 0333  NA 134* 134*  K 3.5 3.9  CL 98* 97*  CO2 26 29  GLUCOSE 178* 116*  BUN 30* 23*  CREATININE 1.20 1.18  CALCIUM 8.0* 8.4*   Liver Function Tests No results for input(s): AST, ALT,  ALKPHOS, BILITOT, PROT, ALBUMIN in the last 72 hours. No results for input(s): LIPASE, AMYLASE in the last 72 hours. Cardiac Enzymes No results for input(s): CKTOTAL, CKMB, CKMBINDEX, TROPONINI in the last 72 hours.  BNP: BNP (last 3 results)  Recent Labs  11/06/16 1621  BNP 961.6*    ProBNP (last 3 results) No results for input(s): PROBNP in the last 8760 hours.   D-Dimer No results for input(s): DDIMER in the last 72 hours. Hemoglobin A1C No results for input(s): HGBA1C in the last 72 hours. Fasting Lipid Panel No results for input(s): CHOL, HDL, LDLCALC, TRIG, CHOLHDL, LDLDIRECT in the last 72 hours. Thyroid Function Tests No results for input(s): TSH, T4TOTAL, T3FREE, THYROIDAB in the last 72  hours.  Invalid input(s): FREET3  Other results:   Imaging    No results found.   Medications:     Scheduled Medications: . aspirin EC  81 mg Oral Daily  . digoxin  0.125 mg Oral Daily  . furosemide  80 mg Intravenous BID  . hydrALAZINE  50 mg Oral Q8H  . isosorbide mononitrate  30 mg Oral Daily  . losartan  12.5 mg Oral Daily  . sodium chloride flush  10-40 mL Intracatheter Q12H  . spironolactone  12.5 mg Oral Daily    Infusions: . sodium chloride    . heparin 2,100 Units/hr (11/11/16 1835)  . methylPREDNISolone (SOLU-MEDROL) injection Stopped (11/11/16 1443)    PRN Medications: sodium chloride, acetaminophen, ondansetron (ZOFRAN) IV, sodium chloride flush    Patient Profile  Benjamin Sherman is a 46 y.o. male with no previous medical history  Presented with worsening SOB and LE edema. New diagnosis of systolic CHF  Assessment/Plan   1. 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 Possible thrombus, CMRI completed with EF 22% component of iCM +/- eosinophilic CM.  - Cath 10/26 with normal cors. 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 No eos on CBC but this does not exclude. With tenuous course  empiric steroids started 10/27. - Remains tenuous but co-ox improved today. Continue to follow closely. If continues to deteriorate may need to start inotropes and consider advanced therapies (not candidate for VAD currently with biventricular HF) - Volume status improving but CVP still up (13)  Continue IV lasix - On low-dose losartan and spiro - No BB with low output.   - Continue digoxin 0.125 mg daily.  - Continue hydralazine 50 mg three times a day.  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 Hypothyroidism - TSH 8.4 on admit ( Ref Range 0.350 - 4.500) - T3 1.6 T4  within normal range.    4. LV thrombus -Continue heparin.  -Resume coumadin today (held with low co-ox yesterday)    Length of Stay: 6   [name], MD  11/12/2016, 8:41 AM  Advanced Heart Failure Team Pager [phone] (M-F; 7a - 4p)  Please contact [facility] for night-coverage after hours (4p -7a ) and weekends on [email or web address]

## 2016-11-12 NOTE — Progress Notes (Signed)
CRITICAL VALUE ALERT  Critical Value: 516  Date & Time Notied:  11/12/2016 1800  Provider Notified: Dr. Cristal Deer   Orders Received/Actions taken: No Orders Rec'd

## 2016-11-12 NOTE — Progress Notes (Signed)
Patient CBG 460. MD notified. Orders received. Will continue to monitor.

## 2016-11-13 LAB — BASIC METABOLIC PANEL
Anion gap: 11 (ref 5–15)
BUN: 26 mg/dL — ABNORMAL HIGH (ref 6–20)
CO2: 29 mmol/L (ref 22–32)
Calcium: 8.1 mg/dL — ABNORMAL LOW (ref 8.9–10.3)
Chloride: 95 mmol/L — ABNORMAL LOW (ref 101–111)
Creatinine, Ser: 1.24 mg/dL (ref 0.61–1.24)
GFR calc Af Amer: >60 mL/min (ref >60)
GFR calc non Af Amer: >60 mL/min (ref >60)
Glucose, Bld: 452 mg/dL — ABNORMAL HIGH (ref 65–99)
Potassium: 3.3 mmol/L — ABNORMAL LOW (ref 3.5–5.1)
Sodium: 135 mmol/L (ref 135–145)

## 2016-11-13 LAB — CBC
HCT: 36.2 % — ABNORMAL LOW (ref 39.0–52.0)
Hemoglobin: 12 g/dL — ABNORMAL LOW (ref 13.0–17.0)
MCH: 28.8 pg (ref 26.0–34.0)
MCHC: 33.1 g/dL (ref 30.0–36.0)
MCV: 87 fL (ref 78.0–100.0)
Platelets: 258 10*3/uL (ref 150–400)
RBC: 4.16 MIL/uL — ABNORMAL LOW (ref 4.22–5.81)
RDW: 16.5 % — ABNORMAL HIGH (ref 11.5–15.5)
WBC: 16.4 10*3/uL — ABNORMAL HIGH (ref 4.0–10.5)

## 2016-11-13 LAB — GLUCOSE, CAPILLARY
Glucose-Capillary: 278 mg/dL — ABNORMAL HIGH (ref 65–99)
Glucose-Capillary: 287 mg/dL — ABNORMAL HIGH (ref 65–99)
Glucose-Capillary: 357 mg/dL — ABNORMAL HIGH (ref 65–99)
Glucose-Capillary: 428 mg/dL — ABNORMAL HIGH (ref 65–99)
Glucose-Capillary: 428 mg/dL — ABNORMAL HIGH (ref 65–99)

## 2016-11-13 LAB — COOXEMETRY PANEL
Carboxyhemoglobin: 1.6 % — ABNORMAL HIGH (ref 0.5–1.5)
Methemoglobin: 0.6 % (ref 0.0–1.5)
O2 Saturation: 61.2 %
Total hemoglobin: 14 g/dL (ref 12.0–16.0)

## 2016-11-13 LAB — HEMOGLOBIN A1C
Hgb A1c MFr Bld: 8 % — ABNORMAL HIGH (ref 4.8–5.6)
Mean Plasma Glucose: 182.9 mg/dL

## 2016-11-13 LAB — PROTIME-INR
INR: 1.26
Prothrombin Time: 15.7 seconds — ABNORMAL HIGH (ref 11.4–15.2)

## 2016-11-13 LAB — HEPARIN LEVEL (UNFRACTIONATED): Heparin Unfractionated: 0.72 IU/mL — ABNORMAL HIGH (ref 0.30–0.70)

## 2016-11-13 LAB — DIGOXIN LEVEL: Digoxin Level: <0.2 ng/mL — ABNORMAL LOW (ref 0.8–2.0)

## 2016-11-13 MED ORDER — METFORMIN HCL 500 MG PO TABS
500.0000 mg | ORAL_TABLET | Freq: Two times a day (BID) | ORAL | Status: DC
  Administered 2016-11-13 – 2016-11-16 (×7): 500 mg via ORAL
  Filled 2016-11-13 (×7): qty 1

## 2016-11-13 MED ORDER — INSULIN ASPART 100 UNIT/ML ~~LOC~~ SOLN
5.0000 [IU] | Freq: Once | SUBCUTANEOUS | Status: AC
  Administered 2016-11-13: 5 [IU] via SUBCUTANEOUS

## 2016-11-13 MED ORDER — INSULIN ASPART 100 UNIT/ML ~~LOC~~ SOLN
4.0000 [IU] | Freq: Three times a day (TID) | SUBCUTANEOUS | Status: DC
  Administered 2016-11-13 – 2016-11-16 (×10): 4 [IU] via SUBCUTANEOUS

## 2016-11-13 MED ORDER — POTASSIUM CHLORIDE CRYS ER 20 MEQ PO TBCR
40.0000 meq | EXTENDED_RELEASE_TABLET | Freq: Once | ORAL | Status: AC
  Administered 2016-11-13: 40 meq via ORAL
  Filled 2016-11-13: qty 2

## 2016-11-13 MED ORDER — METOLAZONE 5 MG PO TABS: 2.5000 mg | ORAL_TABLET | Freq: Once | ORAL | Status: DC

## 2016-11-13 MED ORDER — WARFARIN SODIUM 10 MG PO TABS
10.0000 mg | ORAL_TABLET | Freq: Once | ORAL | Status: AC
  Administered 2016-11-13: 10 mg via ORAL
  Filled 2016-11-13: qty 1

## 2016-11-13 MED ORDER — LIVING WELL WITH DIABETES BOOK
Freq: Once | Status: AC
  Administered 2016-11-13: 1
  Filled 2016-11-13: qty 1

## 2016-11-13 MED ORDER — INSULIN ASPART 100 UNIT/ML ~~LOC~~ SOLN
0.0000 [IU] | Freq: Every day | SUBCUTANEOUS | Status: DC
  Administered 2016-11-13 – 2016-11-14 (×2): 3 [IU] via SUBCUTANEOUS
  Administered 2016-11-15: 4 [IU] via SUBCUTANEOUS

## 2016-11-13 MED ORDER — INSULIN ASPART 100 UNIT/ML ~~LOC~~ SOLN: 4.0000 [IU] | Freq: Three times a day (TID) | SUBCUTANEOUS | Status: DC

## 2016-11-13 MED ORDER — METOLAZONE 5 MG PO TABS
2.5000 mg | ORAL_TABLET | Freq: Once | ORAL | Status: AC
  Administered 2016-11-13: 2.5 mg via ORAL
  Filled 2016-11-13: qty 1

## 2016-11-13 MED ORDER — INSULIN GLARGINE 100 UNIT/ML ~~LOC~~ SOLN
20.0000 [IU] | Freq: Every day | SUBCUTANEOUS | Status: DC
  Administered 2016-11-13 – 2016-11-16 (×4): 20 [IU] via SUBCUTANEOUS
  Filled 2016-11-13 (×4): qty 0.2

## 2016-11-13 MED ORDER — SACUBITRIL-VALSARTAN 24-26 MG PO TABS
1.0000 | ORAL_TABLET | Freq: Two times a day (BID) | ORAL | Status: DC
  Administered 2016-11-13 – 2016-11-16 (×6): 1 via ORAL
  Filled 2016-11-13 (×7): qty 1

## 2016-11-13 MED ORDER — INSULIN ASPART 100 UNIT/ML ~~LOC~~ SOLN
0.0000 [IU] | Freq: Three times a day (TID) | SUBCUTANEOUS | Status: DC
  Administered 2016-11-13: 15 [IU] via SUBCUTANEOUS
  Administered 2016-11-14 (×2): 8 [IU] via SUBCUTANEOUS
  Administered 2016-11-14: 3 [IU] via SUBCUTANEOUS
  Administered 2016-11-15: 15 [IU] via SUBCUTANEOUS
  Administered 2016-11-15 – 2016-11-16 (×2): 3 [IU] via SUBCUTANEOUS
  Administered 2016-11-16: 5 [IU] via SUBCUTANEOUS
  Administered 2016-11-16: 3 [IU] via SUBCUTANEOUS

## 2016-11-13 NOTE — Progress Notes (Signed)
CARDIAC REHAB PHASE I   PRE:  Rate/Rhythm: 108 ST PVCs  BP:  Supine: 124/90  Sitting:   Standing:    SaO2: 96%RA  MODE:  Ambulation: 790 ft   POST:  Rate/Rhythm: 114 ST  BP:  Supine:   Sitting: 127/97  Standing:    SaO2: 98%RA 1337-1410 Pt walked 790 ft on RA with steady gait. Tolerated well. No DOE noted. Glad to walk.    Luetta Nutting, RN BSN  11/13/2016 2:05 PM

## 2016-11-13 NOTE — Progress Notes (Signed)
Advanced Heart Failure Rounding Note  Primary Cardiologist: New (Dr. Tenny Craw) HF: New (Dr. 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   Co-ox 61.2% this am. Creatinine 1.1 -> 1.4 yesterday. BMET pending today.   Feeling OK this am. Breathing improved. Denies CP, lightheadedness or dizziness.  Walked 790 feet with CR 11/11/16.  Negative 1.5 L but weight unchanged. (Weight down 25 lbs total from highest weight this admission.)  Cath 10/26 with normal coronaries with elevated filling pressures R>L. CI = 2.2 l/min/m2  cMRI results from 10/24 reviewed with him:  1. Mildly dilated left ventricle with severe diffuse hypokinesis, EF 22%. 2. There are thrombi noted along the mid-apical anteroseptal and anterior walls as well as the apical inferior wall. 3.  Mildly dilated RV with moderate to severe hypokinesis. 4. There is a pattern of diffuse subendocardial < 50% wall thickness LGE along the mid to apical anterior and anteroseptal wall, the apex, the apical lateral wall, and the apical inferior wall. This could be due to coronary disease within LAD territory but the diffuse nature of the LGE and the multiple thrombi do raise my concern for the possibility of Eosinophilic myocarditis. If this is coronary disease, < 50% wall thickness LGE suggests that the hypokinetic territories may be viable.  Objective:   Weight Range: 232 lb 4.8 oz (105.4 kg) Body mass index is 31.51 kg/m.   Vital Signs:   Temp:  [97.6 F (36.4 C)-98 F (36.7 C)] 97.9 F (36.6 C) (10/29 0738) Pulse Rate:  [110-115] 113 (10/29 0738) Resp:  [18-22] 18 (10/29 0738) BP: (97-128)/(69-96) 124/88 (10/29 0738) SpO2:  [100 %] 100 % (10/29 0738) Weight:  [232 lb 4.8 oz (105.4 kg)] 232 lb 4.8 oz (105.4 kg) (10/29 0323) Last BM Date: 11/12/16  Weight change: Filed Weights   11/11/16 0348 11/12/16 0500 11/13/16 0323  Weight: 238 lb 3 oz (108 kg) 232  lb 14.4 oz (105.6 kg) 232 lb 4.8 oz (105.4 kg)    Intake/Output:   Intake/Output Summary (Last 24 hours) at 11/13/16 0805 Last data filed at 11/13/16 0600  Gross per 24 hour  Intake             1550 ml  Output             3070 ml  Net            -1520 ml      Physical Exam   CVP 11-12   General: Well appearing. No resp difficulty. HEENT: Normal Neck: Supple. JVP 11-12 cm +. Carotids 2+ bilat; no bruits. No thyromegaly or nodule noted. Cor: PMI nondisplaced. RRR, +S3 Lungs: CTAB, normal effort. Abdomen: Soft, non-tender, non-distended, no HSM. No bruits or masses. +BS  Extremities: No cyanosis, clubbing, or rash. Trace to 1+ edema through unna boots.  Neuro: Alert & orientedx3, cranial nerves grossly intact. moves all 4 extremities w/o difficulty. Affect pleasant   Telemetry   Sinus tach 100-110 with PVCs. Personally reviewed.   EKG   Sinus Tach 117 on admit.   Labs    CBC  Recent Labs  11/11/16 0333 11/12/16 1626  WBC 9.3 15.7*  HGB 12.2* 12.1*  HCT 37.2* 36.3*  MCV 86.7 86.6  PLT 258 252   Basic Metabolic Panel  Recent Labs  11/11/16 0333 11/12/16 1626  NA 134* 132*  K 3.9 3.6  CL 97* 96*  CO2 29 24  GLUCOSE 116* 516*  BUN 23* 28*  CREATININE 1.18 1.42*  CALCIUM 8.4* 8.1*   Liver Function Tests No results for input(s): AST, ALT, ALKPHOS, BILITOT, PROT, ALBUMIN in the last 72 hours. No results for input(s): LIPASE, AMYLASE in the last 72 hours. Cardiac Enzymes No results for input(s): CKTOTAL, CKMB, CKMBINDEX, TROPONINI in the last 72 hours.  BNP: BNP (last 3 results)  Recent Labs  11/06/16 1621  BNP 961.6*    ProBNP (last 3 results) No results for input(s): PROBNP in the last 8760 hours.   D-Dimer No results for input(s): DDIMER in the last 72 hours. Hemoglobin A1C No results for input(s): HGBA1C in the last 72 hours. Fasting Lipid Panel No results for input(s): CHOL, HDL, LDLCALC, TRIG, CHOLHDL, LDLDIRECT in the last 72  hours. Thyroid Function Tests No results for input(s): TSH, T4TOTAL, T3FREE, THYROIDAB in the last 72 hours.  Invalid input(s): FREET3  Other results:   Imaging    No results found.   Medications:     Scheduled Medications: . aspirin EC  81 mg Oral Daily  . digoxin  0.125 mg Oral Daily  . furosemide  80 mg Intravenous BID  . hydrALAZINE  50 mg Oral Q8H  . insulin aspart  0-15 Units Subcutaneous TID WC  . insulin aspart  0-5 Units Subcutaneous QHS  . isosorbide mononitrate  30 mg Oral Daily  . losartan  12.5 mg Oral Daily  . sodium chloride flush  10-40 mL Intracatheter Q12H  . spironolactone  12.5 mg Oral Daily  . Warfarin - Pharmacist Dosing Inpatient   Does not apply q1800    Infusions: . sodium chloride    . heparin 2,100 Units/hr (11/12/16 2107)  . methylPREDNISolone (SOLU-MEDROL) injection Stopped (11/12/16 1031)    PRN Medications: sodium chloride, acetaminophen, menthol-cetylpyridinium, ondansetron (ZOFRAN) IV, sodium chloride flush    Patient Profile  Benjamin Sherman is a 46 y.o. male with no previous medical history  Presented with worsening SOB and LE edema. New diagnosis of systolic CHF  Assessment/Plan   1. 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 Possible thrombus, CMRI completed with EF 22% component of iCM +/- eosinophilic CM.  - Cath 10/26 with normal cors. 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 No eos on CBC but this does not exclude. With tenuous course  empiric steroids started 10/27. - Remains tenuous but co-ox improved today. Continue to follow closely. If continues to deteriorate may need to start inotropes and consider advanced therapies (not candidate for VAD currently with biventricular HF) - Volume status improving but CVP still up (11-12)  - Continue IV lasix 80 mg BID. Await BMET before considering metolazone.  - On low-dose losartan and spiro - No BB with  low output.   - Continue digoxin 0.125 mg daily.  - Continue hydralazine 50 mg TID.  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 Back up to 1.4 yesterday, and BMET pending today.   3. Hypothyroidism - TSH 8.4 on admit ( Ref Range 0.350 - 4.500) - T3 1.6 T4 within normal range.    4. LV thrombus -Continue heparin.  -Continue coumadin. INR 1.16.   5. Hyperglycemia  - Glucose 540 this am in setting of high dose steroids.  - Cover with sliding scale.   Length of Stay: [address]   Graciella Freer, [place]  11/13/2016,  8:05 AM  Advanced Heart Failure Team Pager [phone] (M-F; 7a - 4p)  Please contact [facility] for night-coverage after hours (4p -7a ) and weekends on [email or web address]  Patient seen and examined with the above-signed Advanced Practice Provider and/or Housestaff. I personally reviewed laboratory data, imaging studies and relevant notes. I independently examined the patient and formulated the important aspects of the plan. I have edited the note to reflect any of my changes or salient points. I have personally discussed the plan with the patient and/or family.  Still tenuous. Remains tachycardic. Volume status remains elevated despite IV lasix. CVP 12.  Will add metolazone. 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 Will decrease steroids after 5 days. CBGs very high. Cover with SSI. Hgba1c - 8.0 so also has undiagnosed DM. Continue to load coumadin/heparin. Discussed with PharmD.  Titrate HF meds.   Arvilla MeresBensimhon, [place] Parcel, MD  1:38 PM

## 2016-11-13 NOTE — Progress Notes (Signed)
ANTICOAGULATION CONSULT NOTE   Pharmacy Consult for heparin/warfarin Indication: Lv Thrombus  No Known Allergies  Patient Measurements: Height: 6' (182.9 cm) Weight: 232 lb 4.8 oz (105.4 kg) IBW/kg (Calculated) : 77.6 Heparin Dosing Weight: 103 kg  Vital Signs: Temp: 97.9 F (36.6 C) (10/29 0738) Temp Source: Oral (10/29 0738) BP: 126/95 (10/29 1012) Pulse Rate: 112 (10/29 1012)  Labs:  Recent Labs  11/11/16 0333 11/11/16 0524 11/12/16 1626 11/13/16 0915 11/13/16 1025  HGB 12.2*  --  12.1* 12.0*  --   HCT 37.2*  --  36.3* 36.2*  --   PLT 258  --  252 258  --   LABPROT  --  15.5* 14.7 15.7*  --   INR  --  1.24 1.16 1.26  --   HEPARINUNFRC  --  0.26* 0.41  --  0.72*  CREATININE 1.18  --  1.42* 1.24  --     Estimated Creatinine Clearance: 91.4 mL/min (by C-G formula based on SCr of 1.24 mg/dL).   Medical History: History reviewed. No pertinent past medical history.   Assessment: 64 yoM admitted with ADHF found to have likely LV thrombus on preliminary ECHO confirmed via cardiac MRI. Pt started on heparin and warfarin.  Heparin level of 0.7 is upper end of therapeutic with a big jump overnight on heparin infusion at 2100 units/hr. INR is 1.26 after warfarin 7.5mg  x1 and 10mg  x1 CBC ok, no bleeding   Goal of Therapy:  INR 2-3 Heparin level 0.3-0.7 units/ml Monitor platelets by anticoagulation protocol: Yes   Plan:  1. Decrease  heparin infusion  2000 units/hr  2. Warfarin 10 mg x 1 this evening  - repeat 3. Daily heparin level, INR and CBC    Leota Sauers Pharm.D. CPP, BCPS Clinical Pharmacist [phone] 11/13/2016 11:53 AM

## 2016-11-13 NOTE — Progress Notes (Addendum)
Inpatient Diabetes Program Recommendations  AACE/ADA: New Consensus Statement on Inpatient Glycemic Control (2015)  Target Ranges:  Prepandial:   less than 140 mg/dL      Peak postprandial:   less than 180 mg/dL (1-2 hours)      Critically ill patients:  140 - 180 mg/dL   Lab Results  Component Value Date   GLUCAP 287 (H) 11/13/2016   HGBA1C 8.0 (H) 11/13/2016    Review of Glycemic Control Results for Benjamin Sherman, Benjamin Sherman (MRN [id number]) as of 11/13/2016 10:03  Ref. Range 11/12/2016 23:18 11/13/2016 00:54 11/13/2016 07:35  Glucose-Capillary Latest Ref Range: 65 - 99 mg/dL 242 (H) 353 (H) 614 (H)   Diabetes history: No prior hx DM Current orders for Inpatient glycemic control: Novolog correction 0-15 units tid + 0-5 units hs  Inpatient Diabetes Program Recommendations:    Noted new onset DM2. Ordered Living Well With Diabetes book, dietician consult, and patient education videos.  Please consider while on steroids: -Lantus 20 units daily (0.2 units/kg x 105.4 kg) Novolog 4 units tid meal coverage if eats 50% Spoke with pt about new diagnosis. Discussed A1C results with them and explained what an A1C is, basic pathophysiology of DM Type 2, basic home care, basic diabetes diet nutrition principles, importance of checking CBGs and maintaining good CBG control to prevent long-term and short-term complications. Reviewed signs and symptoms of hyperglycemia and hypoglycemia and how to treat hypoglycemia at home. Also reviewed blood sugar goals at home.  RNs to provide ongoing basic DM education at bedside with this patient. Have ordered educational booklet and DM videos. Have also placed RD consult for DM diet education for this patient.    Thank you, Benjamin Sherman. Benjamin Bushey, RN, MSN, CDE  Diabetes Coordinator Inpatient Glycemic Control Team Team Pager [phone] (8am-5pm) 11/13/2016 10:06 AM

## 2016-11-13 NOTE — Progress Notes (Signed)
Nutrition Education Note  Pt is a 46 year old male admitted for acute systolic CHF with worsening SOB and LE edema. Pt also has new onset DM. No previous medical history.  Pt reports that the Diabetes coordinator came earlier today and gave him education on "watching his sugars". Pt understands that he needs to monitor his carbohydrate intake and aim for 3-5 servings of carbs for meals and 1-2 servings of carbs for snacks.   Previously educated this pt on a CHF diet and we discussed the importance of reading nutrition labels. We reviewed that today with emphasis on carbohydrates and serving sizes . We talked before about incorporating more fruits and vegetables. Today we discussed that some fruits are higher in carbohydrate and sugars. Pt was able to identify several foods that are higher in carbohydrates.  Reviewed CHF concepts and how to implement both a heart healthy and DM diet.  Discussed and provided the Nutrition Care Manual handouts "Diabetes Label Reading Tips" and "Carbohydrate Counting for People with Diabetes".   Pt plans to monitor his carbohydrate intake and incorporate more vegetables into his diet. Expect good compliance.   Chart reviewed.  Wynetta Emery [facility] Dietetic Intern Pager: [phone] 11/13/2016 4:23 PM

## 2016-11-14 DIAGNOSIS — I5023 Acute on chronic systolic (congestive) heart failure: Secondary | ICD-10-CM

## 2016-11-14 LAB — BASIC METABOLIC PANEL
Anion gap: 10 (ref 5–15)
BUN: 20 mg/dL (ref 6–20)
CO2: 34 mmol/L — ABNORMAL HIGH (ref 22–32)
Calcium: 8.3 mg/dL — ABNORMAL LOW (ref 8.9–10.3)
Chloride: 92 mmol/L — ABNORMAL LOW (ref 101–111)
Creatinine, Ser: 0.97 mg/dL (ref 0.61–1.24)
GFR calc Af Amer: >60 mL/min (ref >60)
GFR calc non Af Amer: >60 mL/min (ref >60)
Glucose, Bld: 209 mg/dL — ABNORMAL HIGH (ref 65–99)
Potassium: 2.9 mmol/L — ABNORMAL LOW (ref 3.5–5.1)
Sodium: 136 mmol/L (ref 135–145)

## 2016-11-14 LAB — CBC
HCT: 38.5 % — ABNORMAL LOW (ref 39.0–52.0)
Hemoglobin: 12.8 g/dL — ABNORMAL LOW (ref 13.0–17.0)
MCH: 28.8 pg (ref 26.0–34.0)
MCHC: 33.2 g/dL (ref 30.0–36.0)
MCV: 86.7 fL (ref 78.0–100.0)
Platelets: 264 10*3/uL (ref 150–400)
RBC: 4.44 MIL/uL (ref 4.22–5.81)
RDW: 15.9 % — ABNORMAL HIGH (ref 11.5–15.5)
WBC: 18.4 10*3/uL — ABNORMAL HIGH (ref 4.0–10.5)

## 2016-11-14 LAB — COOXEMETRY PANEL
Carboxyhemoglobin: 1.7 % — ABNORMAL HIGH (ref 0.5–1.5)
Methemoglobin: 0.8 % (ref 0.0–1.5)
O2 Saturation: 63.3 %
Total hemoglobin: 13.3 g/dL (ref 12.0–16.0)

## 2016-11-14 LAB — PROTIME-INR
INR: 1.63
Prothrombin Time: 19.2 seconds — ABNORMAL HIGH (ref 11.4–15.2)

## 2016-11-14 LAB — GLUCOSE, CAPILLARY
Glucose-Capillary: 165 mg/dL — ABNORMAL HIGH (ref 65–99)
Glucose-Capillary: 173 mg/dL — ABNORMAL HIGH (ref 65–99)
Glucose-Capillary: 191 mg/dL — ABNORMAL HIGH (ref 65–99)
Glucose-Capillary: 261 mg/dL — ABNORMAL HIGH (ref 65–99)
Glucose-Capillary: 277 mg/dL — ABNORMAL HIGH (ref 65–99)
Glucose-Capillary: 296 mg/dL — ABNORMAL HIGH (ref 65–99)

## 2016-11-14 LAB — HEPARIN LEVEL (UNFRACTIONATED): Heparin Unfractionated: 0.71 IU/mL — ABNORMAL HIGH (ref 0.30–0.70)

## 2016-11-14 MED ORDER — TORSEMIDE 20 MG PO TABS
40.0000 mg | ORAL_TABLET | Freq: Two times a day (BID) | ORAL | Status: DC
  Administered 2016-11-14 – 2016-11-16 (×4): 40 mg via ORAL
  Filled 2016-11-14 (×4): qty 2

## 2016-11-14 MED ORDER — WARFARIN SODIUM 5 MG PO TABS
5.0000 mg | ORAL_TABLET | Freq: Once | ORAL | Status: AC
  Administered 2016-11-14: 5 mg via ORAL
  Filled 2016-11-14: qty 1

## 2016-11-14 MED ORDER — METOLAZONE 5 MG PO TABS
2.5000 mg | ORAL_TABLET | Freq: Once | ORAL | Status: AC
  Administered 2016-11-14: 2.5 mg via ORAL
  Filled 2016-11-14: qty 1

## 2016-11-14 MED ORDER — POTASSIUM CHLORIDE CRYS ER 20 MEQ PO TBCR
60.0000 meq | EXTENDED_RELEASE_TABLET | Freq: Two times a day (BID) | ORAL | Status: DC
  Administered 2016-11-14 (×2): 60 meq via ORAL
  Filled 2016-11-14 (×2): qty 3

## 2016-11-14 MED ORDER — METOLAZONE 5 MG PO TABS: 2.5000 mg | ORAL_TABLET | Freq: Once | ORAL | Status: DC

## 2016-11-14 MED ORDER — HYDRALAZINE HCL 25 MG PO TABS
25.0000 mg | ORAL_TABLET | Freq: Three times a day (TID) | ORAL | Status: DC
  Administered 2016-11-14 – 2016-11-16 (×6): 25 mg via ORAL
  Filled 2016-11-14 (×6): qty 1

## 2016-11-14 NOTE — Progress Notes (Signed)
Met patient who was diagnosed with heart failure.  He is thinking a lot about the changes that he will need to make in his cooking and eating.  Doing ok with exception of thinking lots about the changes in his health Conard Novak, [place] .    11/14/16 1500  Clinical Encounter Type  Visited With Patient  Visit Type Initial;Spiritual support  Stress Factors  Patient Stress Factors Health changes  Family Stress Factors None identified

## 2016-11-14 NOTE — Progress Notes (Signed)
Advanced Heart Failure Rounding Note  Primary Cardiologist: New (Dr. Tenny Craw) HF: New (Dr. 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   Co-ox 63.3% this am. Metolazone added to augment IV diuresis yesterday. Entresto also started.   Weight down 9 pounds overnight (34 pounds total). BMET pending  Breathing improved. Sugars remain in 40s on steroids. Using SSI. Seen by DM coordinator. Will add lantus 20. Continue SSI    Cath 10/26 with normal coronaries with elevated filling pressures R>L. CI = 2.2 l/min/m2  cMRI results from 10/24 reviewed with him:  1. Mildly dilated left ventricle with severe diffuse hypokinesis, EF 22%. 2. There are thrombi noted along the mid-apical anteroseptal and anterior walls as well as the apical inferior wall. 3.  Mildly dilated RV with moderate to severe hypokinesis. 4. There is a pattern of diffuse subendocardial < 50% wall thickness LGE along the mid to apical anterior and anteroseptal wall, the apex, the apical lateral wall, and the apical inferior wall. This could be due to coronary disease within LAD territory but the diffuse nature of the LGE and the multiple thrombi do raise my concern for the possibility of Eosinophilic myocarditis. If this is coronary disease, < 50% wall thickness LGE suggests that the hypokinetic territories may be viable.  Objective:   Weight Range: 101.6 kg (223 lb 14.4 oz) Body mass index is 30.37 kg/m.   Vital Signs:   Temp:  [96.8 F (36 C)-98.6 F (37 C)] 98 F (36.7 C) (10/30 0420) Pulse Rate:  [59-113] 106 (10/30 0420) Resp:  [14-25] 14 (10/30 0420) BP: (105-127)/(74-99) 105/76 (10/30 0420) SpO2:  [100 %] 100 % (10/30 0420) Weight:  [101.6 kg (223 lb 14.4 oz)] 101.6 kg (223 lb 14.4 oz) (10/30 0429) Last BM Date: 11/12/16  Weight change: Filed Weights   11/12/16 0500 11/13/16 0323 11/14/16 0429  Weight: 105.6 kg (232 lb 14.4 oz) 105.4  kg (232 lb 4.8 oz) 101.6 kg (223 lb 14.4 oz)    Intake/Output:   Intake/Output Summary (Last 24 hours) at 11/14/16 0551 Last data filed at 11/14/16 0432  Gross per 24 hour  Intake             1530 ml  Output             4825 ml  Net            -3295 ml      Physical Exam   CVP 8  General:  Lying in bed . No resp difficulty HEENT: normal Neck: supple. JVP 8. Carotids 2+ bilat; no bruits. No lymphadenopathy or thryomegaly appreciated. Cor: PMI laterally displaced. Tachy regular  N+ s3 Lungs: clear Abdomen: soft, nontender, nondistended. No hepatosplenomegaly. No bruits or masses. Good bowel sounds. Extremities: no cyanosis, clubbing, rash, 1-2+ edema under UNNA boots Neuro: alert & orientedx3, cranial nerves grossly intact. moves all 4 extremities w/o difficulty. Affect pleasant   Telemetry   Sinus tach 100-110 + PVCs. Personally reviewed.   EKG   Sinus Tach 117 on admit.   Labs    CBC  Recent Labs  11/12/16 1626 11/13/16 0915  WBC 15.7* 16.4*  HGB 12.1* 12.0*  HCT 36.3* 36.2*  MCV 86.6 87.0  PLT 252 258   Basic Metabolic Panel  Recent Labs  11/12/16 1626 11/13/16 0915  NA 132* 135  K 3.6 3.3*  CL 96* 95*  CO2 24 29  GLUCOSE 516* 452*  BUN  28* 26*  CREATININE 1.42* 1.24  CALCIUM 8.1* 8.1*   Liver Function Tests No results for input(s): AST, ALT, ALKPHOS, BILITOT, PROT, ALBUMIN in the last 72 hours. No results for input(s): LIPASE, AMYLASE in the last 72 hours. Cardiac Enzymes No results for input(s): CKTOTAL, CKMB, CKMBINDEX, TROPONINI in the last 72 hours.  BNP: BNP (last 3 results)  Recent Labs  11/06/16 1621  BNP 961.6*    ProBNP (last 3 results) No results for input(s): PROBNP in the last 8760 hours.   D-Dimer No results for input(s): DDIMER in the last 72 hours. Hemoglobin A1C  Recent Labs  11/13/16 0811  HGBA1C 8.0*   Fasting Lipid Panel No results for input(s): CHOL, HDL, LDLCALC, TRIG, CHOLHDL, LDLDIRECT in the last  72 hours. Thyroid Function Tests No results for input(s): TSH, T4TOTAL, T3FREE, THYROIDAB in the last 72 hours.  Invalid input(s): FREET3  Other results:   Imaging    No results found.   Medications:     Scheduled Medications: . aspirin EC  81 mg Oral Daily  . digoxin  0.125 mg Oral Daily  . furosemide  80 mg Intravenous BID  . hydrALAZINE  50 mg Oral Q8H  . insulin aspart  0-15 Units Subcutaneous TID WC  . insulin aspart  0-5 Units Subcutaneous QHS  . insulin aspart  4 Units Subcutaneous TID WC  . insulin glargine  20 Units Subcutaneous Daily  . isosorbide mononitrate  30 mg Oral Daily  . metFORMIN  500 mg Oral BID WC  . sacubitril-valsartan  1 tablet Oral BID  . sodium chloride flush  10-40 mL Intracatheter Q12H  . spironolactone  12.5 mg Oral Daily  . Warfarin - Pharmacist Dosing Inpatient   Does not apply q1800    Infusions: . sodium chloride    . heparin 2,100 Units/hr (11/13/16 2046)  . methylPREDNISolone (SOLU-MEDROL) injection Stopped (11/13/16 0927)    PRN Medications: sodium chloride, acetaminophen, menthol-cetylpyridinium, ondansetron (ZOFRAN) IV, sodium chloride flush    Patient Profile  Benjamin Sherman is a 46 y.o. male with no previous medical history  Presented with worsening SOB and LE edema. New diagnosis of systolic CHF  Assessment/Plan   1. 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 Possible thrombus, CMRI completed with EF 22% component of iCM +/- eosinophilic CM.  - Cath 10/26 with normal cors. 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 No eos on CBC but this does not exclude. With tenuous course  empiric steroids started 10/27. - Will give solumedorl 1g per day for 5 days then taper discussed with PharmD - Remains tenuous but co-ox continue to improve. (not candidate for VAD currently with biventricular HF) - Volume status improving. CVP now 8 - Continue IV lasix 80 mg  and metolazone 2.5 bid this am and then switch to torsemide this afternoon - On Entresto 24/26 and spiro - No BB with low output.   - Continue digoxin 0.125 mg daily.  - Continue hydralazine 50 mg TID.  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 - Continue aggressive diuresis one more day if renal function tolerates  3. Hypothyroidism - TSH 8.4 on admit ( Ref Range 0.350 - 4.500) - T3 1.6 T4 within normal range.    4. LV thrombus -Continue heparin.  -Continue coumadin. INR 1.63 -  Discussed with PharmD. Stop heparin when INR 2.0 or greater.   5. Hyperglycemia  - Glucose > 400 this am in setting of high dose steroids. Will add lantus per DM coordinator (thanks!) - Continue to cover with sliding scale.   Length of Stay: 8   [name], MD  11/14/2016, 5:51 AM  Advanced Heart Failure Team Pager [phone] (M-F; 7a - 4p)  Please contact [facility] for night-coverage after hours (4p -7a ) and weekends on [email or web address]

## 2016-11-14 NOTE — Progress Notes (Signed)
CARDIAC REHAB PHASE I   PRE:  Rate/Rhythm: 112 ST    BP: sitting 106//74    SaO2: 98 RA  MODE:  Ambulation: 790 ft   POST:  Rate/Rhythm: 122 ST    BP: sitting 107/76     SaO2: 98 RA  Tolerated well, no c/o, feels good. Encouraged more walking.  [id number]   Harriet Masson CES, ACSM 11/14/2016 2:22 PM

## 2016-11-14 NOTE — Progress Notes (Signed)
ANTICOAGULATION CONSULT NOTE   Pharmacy Consult for heparin/warfarin Indication: Lv Thrombus  No Known Allergies  Patient Measurements: Height: 6' (182.9 cm) Weight: 223 lb 14.4 oz (101.6 kg) IBW/kg (Calculated) : 77.6 Heparin Dosing Weight: 103 kg  Vital Signs: Temp: 98 F (36.7 C) (10/30 0420) Temp Source: Oral (10/30 0420) BP: 102/72 (10/30 3428) Pulse Rate: 106 (10/30 0420)  Labs:  Recent Labs  11/12/16 1626 11/13/16 0915 11/13/16 1025 11/14/16 0342  HGB 12.1* 12.0*  --   --   HCT 36.3* 36.2*  --   --   PLT 252 258  --   --   LABPROT 14.7 15.7*  --  19.2*  INR 1.16 1.26  --  1.63  HEPARINUNFRC 0.41  --  0.72* 0.71*  CREATININE 1.42* 1.24  --   --     Estimated Creatinine Clearance: 89.9 mL/min (by C-G formula based on SCr of 1.24 mg/dL).   Medical History: History reviewed. No pertinent past medical history.   Assessment: 33 yoM admitted with ADHF found to have likely LV thrombus on preliminary ECHO confirmed via cardiac MRI. Pt started on heparin and warfarin per pharmacy.  Heparin slightly supratherapeutic at 0.71, INR trending up to 1.63, CBC stable.  Goal of Therapy:  INR 2-3 Heparin level 0.3-0.7 units/ml Monitor platelets by anticoagulation protocol: Yes   Plan:  -Reduce heparin to 1950 units/hr -Warfarin 5mg  PO x1 tonight -Daily INR, heparin level, CBC, S/Sx bleeding  Fredonia Highland, PharmD PGY-2 Cardiology Pharmacy Resident Pager: [phone] 11/14/2016

## 2016-11-15 ENCOUNTER — Other Ambulatory Visit (HOSPITAL_COMMUNITY): Payer: Self-pay

## 2016-11-15 LAB — PROTIME-INR
INR: 2.43
Prothrombin Time: 26.2 seconds — ABNORMAL HIGH (ref 11.4–15.2)

## 2016-11-15 LAB — GLUCOSE, CAPILLARY
Glucose-Capillary: 196 mg/dL — ABNORMAL HIGH (ref 65–99)
Glucose-Capillary: 230 mg/dL — ABNORMAL HIGH (ref 65–99)
Glucose-Capillary: 415 mg/dL — ABNORMAL HIGH (ref 65–99)
Glucose-Capillary: 335 mg/dL — ABNORMAL HIGH (ref 65–99)

## 2016-11-15 LAB — CBC
HCT: 39.9 % (ref 39.0–52.0)
Hemoglobin: 13.2 g/dL (ref 13.0–17.0)
MCH: 28.6 pg (ref 26.0–34.0)
MCHC: 33.1 g/dL (ref 30.0–36.0)
MCV: 86.4 fL (ref 78.0–100.0)
Platelets: 294 10*3/uL (ref 150–400)
RBC: 4.62 MIL/uL (ref 4.22–5.81)
RDW: 15.9 % — ABNORMAL HIGH (ref 11.5–15.5)
WBC: 15.8 10*3/uL — ABNORMAL HIGH (ref 4.0–10.5)

## 2016-11-15 LAB — BASIC METABOLIC PANEL
Anion gap: 9 (ref 5–15)
BUN: 26 mg/dL — ABNORMAL HIGH (ref 6–20)
CO2: 38 mmol/L — ABNORMAL HIGH (ref 22–32)
Calcium: 8.1 mg/dL — ABNORMAL LOW (ref 8.9–10.3)
Chloride: 88 mmol/L — ABNORMAL LOW (ref 101–111)
Creatinine, Ser: 1.11 mg/dL (ref 0.61–1.24)
GFR calc Af Amer: >60 mL/min (ref >60)
GFR calc non Af Amer: >60 mL/min (ref >60)
Glucose, Bld: 211 mg/dL — ABNORMAL HIGH (ref 65–99)
Potassium: 3.2 mmol/L — ABNORMAL LOW (ref 3.5–5.1)
Sodium: 135 mmol/L (ref 135–145)

## 2016-11-15 LAB — COOXEMETRY PANEL
Carboxyhemoglobin: 1.2 % (ref 0.5–1.5)
Methemoglobin: 1 % (ref 0.0–1.5)
O2 Saturation: 66.6 %
Total hemoglobin: 13.5 g/dL (ref 12.0–16.0)

## 2016-11-15 LAB — HEPARIN LEVEL (UNFRACTIONATED): Heparin Unfractionated: 0.62 IU/mL (ref 0.30–0.70)

## 2016-11-15 MED ORDER — WARFARIN SODIUM 2 MG PO TABS
2.0000 mg | ORAL_TABLET | Freq: Once | ORAL | Status: AC
  Administered 2016-11-15: 2 mg via ORAL
  Filled 2016-11-15: qty 1

## 2016-11-15 MED ORDER — PREDNISONE 20 MG PO TABS: 40.0000 mg | ORAL_TABLET | Freq: Every day | ORAL | Status: DC

## 2016-11-15 MED ORDER — PREDNISONE 20 MG PO TABS: 60.0000 mg | ORAL_TABLET | Freq: Every day | ORAL | Status: DC

## 2016-11-15 MED ORDER — PREDNISONE 20 MG PO TABS: 80.0000 mg | ORAL_TABLET | Freq: Every day | ORAL | Status: DC

## 2016-11-15 MED ORDER — POTASSIUM CHLORIDE CRYS ER 20 MEQ PO TBCR
60.0000 meq | EXTENDED_RELEASE_TABLET | Freq: Two times a day (BID) | ORAL | Status: DC
  Administered 2016-11-15: 60 meq via ORAL
  Filled 2016-11-15 (×2): qty 3

## 2016-11-15 MED ORDER — PREDNISONE 20 MG PO TABS: 100.0000 mg | ORAL_TABLET | Freq: Every day | ORAL | Status: DC

## 2016-11-15 MED ORDER — PREDNISONE 20 MG PO TABS
100.0000 mg | ORAL_TABLET | Freq: Every day | ORAL | Status: DC
  Administered 2016-11-16: 100 mg via ORAL
  Filled 2016-11-15: qty 5

## 2016-11-15 MED ORDER — POTASSIUM CHLORIDE CRYS ER 20 MEQ PO TBCR
60.0000 meq | EXTENDED_RELEASE_TABLET | Freq: Three times a day (TID) | ORAL | Status: DC
  Administered 2016-11-15: 60 meq via ORAL
  Filled 2016-11-15: qty 3

## 2016-11-15 MED ORDER — IVABRADINE HCL 5 MG PO TABS
2.5000 mg | ORAL_TABLET | Freq: Two times a day (BID) | ORAL | Status: DC
  Administered 2016-11-15 – 2016-11-16 (×3): 2.5 mg via ORAL
  Filled 2016-11-15 (×3): qty 1

## 2016-11-15 MED ORDER — POTASSIUM CHLORIDE CRYS ER 20 MEQ PO TBCR
60.0000 meq | EXTENDED_RELEASE_TABLET | Freq: Once | ORAL | Status: AC
  Administered 2016-11-15: 60 meq via ORAL
  Filled 2016-11-15: qty 3

## 2016-11-15 MED ORDER — PREDNISONE 5 MG PO TABS: 5.0000 mg | ORAL_TABLET | Freq: Every day | ORAL | Status: DC

## 2016-11-15 MED ORDER — PREDNISONE 20 MG PO TABS: 20.0000 mg | ORAL_TABLET | Freq: Every day | ORAL | Status: DC

## 2016-11-15 MED ORDER — PREDNISONE 10 MG PO TABS: 10.0000 mg | ORAL_TABLET | Freq: Every day | ORAL | Status: DC

## 2016-11-15 MED ORDER — SPIRONOLACTONE 25 MG PO TABS
25.0000 mg | ORAL_TABLET | Freq: Every day | ORAL | Status: DC
  Administered 2016-11-15 – 2016-11-16 (×2): 25 mg via ORAL
  Filled 2016-11-15 (×2): qty 1

## 2016-11-15 NOTE — Progress Notes (Signed)
A prolonged steroid taper was added to his orders and is as follows.  Take 100 mg of prednisone daily for 14 days, then 80 mg daily for 14 days, and continue to decrease the dose by 20 mg every 14 days. Once 20mg  daily has been completed, split the dose in half (10 mg) for two weeks and finish with one week of 5 mg. Last estimated day of prednisone is 02/08/2017.  Nolen Mu PharmD PGY1 Pharmacy Practice Resident 11/15/2016 2:53 PM Pager: [phone]

## 2016-11-15 NOTE — Progress Notes (Signed)
Heart Failure Navigator Consult Note  Presentation: Benjamin Sherman is a 46 yo who presents today with SOB LE edema.  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 No pertinent past medical history.  Social History   Social History  . Marital status: Single    Spouse name: N/A  . Number of children: N/A  . Years of education: N/A   Occupational History  . security    Social History Main Topics  . Smoking status: Current Some Day Smoker    Types: Cigars  . Smokeless tobacco: Never Used     Comment: 6 cigars per wk  . Alcohol use 8.4 oz/week    12 Cans of beer, 2 Shots of liquor per week  . Drug use: No  . Sexual activity: Not Asked   Other Topics Concern  . None   Social History Narrative  . None    ECHO:Study Conclusions-11/07/16  - Left ventricle: Apical images show possible hypertrophy of the   mid LV to apex that can be seen in apical HOCM. Definity contrast   shows multiple small filling defects and one large defect. This   is likely thrombus but recommend further evaluation with Cardiac   MRI to verify thrombus and assess for apical HOCM. The cavity   size was severely dilated. Systolic function was normal. The   estimated ejection fraction was 15%. Severe diffuse hypokinesis   with regional variations. The study is not technically sufficient   to allow evaluation of LV diastolic function. - Aortic valve: Trileaflet; mildly thickened, mildly calcified   leaflets. - Mitral valve: Calcified annulus. There was mild regurgitation. - Left atrium: The atrium was mildly dilated. - Pulmonic valve: There was trivial regurgitation. - Pulmonary arteries: PA peak  pressure: 38 mm Hg (S).  Impressions:  - The right ventricular systolic pressure was increased consistent with mild pulmonary hypertension.  ------------------------------------------------------------------- Study data:  No prior study was available for comparison.  Study status:  Routine.  Procedure:  The patient reported no pain pre or post test. Transthoracic echocardiography. Image quality was adequate.  Study completion:  There were no complications. Transthoracic echocardiography.  M-mode, complete 2D, spectral Doppler, and color Doppler.  Birthdate:  Patient birthdate: Dec 12, 1970.  Age:  Patient is 46 yr old.  Sex:  Gender: male. BMI: 35 kg/m^2.  Blood pressure:     116/88  Patient status: Inpatient.  Study date:  Study date: 11/07/2016. Study time: 09:49 AM.  Location:  Bedside  Cardiac Catheterization: 11/10/16  Findings:  Ao =96/78 (86) - narrow pulse pressure on tracing LV = 94/24 RA =  17 RV = 48/19 PA = 49/23 (37) PCW = 19 Fick cardiac output/index = 5.2/2.2 PVR = 3.4 WU SVR = 1055 Ao sat = 95% PA sat = 58%, 62% PaPI = 1.5  Assessment: 1. 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 Severe biventricular HF 3. Mildly depressed CO  Plan/Discussion:  Continue medical therapy. Resume heparin. Load coumadin.  BNP    Component Value Date/Time   BNP 961.6 (H) 11/06/2016 1621    ProBNP  No results found for: PROBNP   Education Assessment and Provision:  Detailed education and instructions provided on heart failure disease management including the following:  Signs and symptoms of Heart Failure When to call the physician Importance of daily weights Low sodium diet Fluid restriction Medication management Anticipated future follow-up appointments  Patient education given on each of the above topics.  Patient acknowledges understanding and acceptance of all instructions.  I spoke with Mr. Benjamin Sherman regarding his HF.  He tells me that  he has been told that his "heart is weak".  I reviewed the diagnosis and we discussed daily weights.  He did not have a scale and I have provided one for daily weights.  I also reviewed a low sodium diet and high sodium foods to avoid.  He currently has no insurance and admits that many new medications may be a problem to afford.  I will provide home HF medications through [facility] at the time of discharge.  I will also refer him to HF clinic LCSW for ongoing financial assistance and help with insurance.  He will follow in the AHF Clinic after discharge.  Education Materials:  "Living Better With Heart Failure" Booklet, Daily Weight Tracker Tool    High Risk Criteria for Readmission and/or Poor Patient Outcomes:  (Recommend Follow-up with Advanced Heart Failure Clinic)--yes   EF <30%- Yes 22%  2 or more admissions in 6 months- No--New diagnosis of HF  Difficult social situation- Yes -lives with a "roommate"-has no insurance  Demonstrates medication noncompliance- No denies   Barriers of Care:  Financial, New HF, Knowledge and compliance  Discharge Planning: Plans to return to home in [place] with "roommate".   [facility] LPHRN for ongoing HF education, compliance reinforcement and symptom recognition.

## 2016-11-15 NOTE — Progress Notes (Addendum)
ANTICOAGULATION CONSULT NOTE   Pharmacy Consult for warfarin Indication: Lv Thrombus  No Known Allergies  Patient Measurements: Height: 6' (182.9 cm) Weight: 214 lb (97.1 kg) IBW/kg (Calculated) : 77.6 Heparin Dosing Weight: 103 kg  Vital Signs: Temp: 98.1 F (36.7 C) (10/31 0749) Temp Source: Oral (10/31 0749) BP: 107/73 (10/31 0749) Pulse Rate: 83 (10/31 0749)  Labs:  Recent Labs  11/13/16 0915 11/13/16 1025 11/14/16 0342 11/14/16 0811 11/15/16 0420 11/15/16 0709  HGB 12.0*  --   --  12.8* 13.2  --   HCT 36.2*  --   --  38.5* 39.9  --   PLT 258  --   --  264 294  --   LABPROT 15.7*  --  19.2*  --   --  26.2*  INR 1.26  --  1.63  --   --  2.43  HEPARINUNFRC  --  0.72* 0.71*  --   --  0.62  CREATININE 1.24  --   --  0.97 1.11  --     Estimated Creatinine Clearance: 98.3 mL/min (by C-G formula based on SCr of 1.11 mg/dL).   Medical History: History reviewed. No pertinent past medical history.  Assessment: 17 yoM admitted with ADHF found to have likely LV thrombus on preliminary ECHO confirmed via cardiac MRI. Pt on warfarin per pharmacy  INR trending up rapidly to 2.14, CBC stable, no bleeding noted  Goal of Therapy:  INR 2-3 Heparin level 0.3-0.7 units/ml Monitor platelets by anticoagulation protocol: Yes   Plan:  -Warfarin 2mg  PO x1 tonight -Daily INR, CBC, S/Sx bleeding  Adline Potter, PharmD Pharmacy Resident Pager: [phone]

## 2016-11-15 NOTE — Progress Notes (Signed)
Inpatient Diabetes Program Recommendations  AACE/ADA: New Consensus Statement on Inpatient Glycemic Control (2015)  Target Ranges:  Prepandial:   less than 140 mg/dL      Peak postprandial:   less than 180 mg/dL (1-2 hours)      Critically ill patients:  140 - 180 mg/dL   Lab Results  Component Value Date   GLUCAP 196 (H) 11/15/2016   HGBA1C 8.0 (H) 11/13/2016    Review of Glycemic Control Results for [name], [name] (MRN [id number]) as of 11/15/2016 09:07  Ref. Range 11/14/2016 07:56 11/14/2016 11:11 11/14/2016 16:06 11/14/2016 21:40 11/15/2016 06:35  Glucose-Capillary Latest Ref Range: 65 - 99 mg/dL 700 (H) 174 (H) 944 (H) 277 (H) 196 (H)   Inpatient Diabetes Program Recommendations:   Noted postprandial CBGs elevated. -Increase Novolog meal coverage to 6 units tid if eats 50% As steroids decreased, will need to adjust insulin.  Thank you, Billy Fischer. Kalaysia Demonbreun, RN, MSN, CDE  Diabetes Coordinator Inpatient Glycemic Control Team Team Pager [phone] (8am-5pm) 11/15/2016 9:08 AM

## 2016-11-15 NOTE — Progress Notes (Signed)
Patient has 23 beats of V-Tach,patient is asymptomatic,BP 116/87,O2 saturation 965 on room air.Dr. Vonzella Nipple made aware without order made.will continue to monitor.

## 2016-11-15 NOTE — Progress Notes (Signed)
Advanced Heart Failure Rounding Note  Primary Cardiologist: New (Dr. Tenny Crawoss) HF: New (Dr. 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   Co-ox 66.6% this am.  Metolazone added to augment IV diuresis 10/99/18. Entresto also started.   Negative 4.3 L and down another another 9 lbs. CVP 6 this am.  Creatinine 1.11. INR pending.   Feeling great this am. Denies SOB, lightheadedness or dizziness. Is having some Nausea at night, which is currently his only complaint.  Swelling improved but remains in ankles.   Cath 10/26 with normal coronaries with elevated filling pressures R>L. CI = 2.2 l/min/m2  cMRI results from 10/24 reviewed with him:  1. Mildly dilated left ventricle with severe diffuse hypokinesis, EF 22%. 2. There are thrombi noted along the mid-apical anteroseptal and anterior walls as well as the apical inferior wall. 3.  Mildly dilated RV with moderate to severe hypokinesis. 4. There is a pattern of diffuse subendocardial < 50% wall thickness LGE along the mid to apical anterior and anteroseptal wall, the apex, the apical lateral wall, and the apical inferior wall. This could be due to coronary disease within LAD territory but the diffuse nature of the LGE and the multiple thrombi do raise my concern for the possibility of Eosinophilic myocarditis. If this is coronary disease, < 50% wall thickness LGE suggests that the hypokinetic territories may be viable.  Objective:   Weight Range: 214 lb (97.1 kg) Body mass index is 29.02 kg/m.   Vital Signs:   Temp:  [98.1 F (36.7 C)-98.6 F (37 C)] 98.1 F (36.7 C) (10/31 0426) Pulse Rate:  [83-112] 83 (10/31 0749) Resp:  [15-19] 15 (10/31 0749) BP: (95-117)/(61-87) 107/73 (10/31 0749) SpO2:  [95 %-100 %] 100 % (10/31 0749) Weight:  [214 lb (97.1 kg)] 214 lb (97.1 kg) (10/31 0530) Last BM Date: 11/12/16  Weight change: Filed Weights   11/13/16 0323  11/14/16 0429 11/15/16 0530  Weight: 232 lb 4.8 oz (105.4 kg) 223 lb 14.4 oz (101.6 kg) 214 lb (97.1 kg)    Intake/Output:   Intake/Output Summary (Last 24 hours) at 11/15/16 0751 Last data filed at 11/15/16 0645  Gross per 24 hour  Intake             1094 ml  Output             5475 ml  Net            -4381 ml     Physical Exam   CVP 6  General: Lying in bed. NAD HEENT: Normal Neck: Supple. JVP 5-6. Carotids 2+ bilat; no bruits. No thyromegaly or nodule noted. Cor: PMI nondisplaced. Regular, tachy. +S3.  Lungs: CTAB, normal effort. Abdomen: Soft, non-tender, non-distended, no HSM. No bruits or masses. +BS  Extremities: No cyanosis, clubbing, or rash. 1+ edema. UNNA boots in place.  Neuro: Alert & orientedx3, cranial nerves grossly intact. moves all 4 extremities w/o difficulty. Affect pleasant   Telemetry    Sinus tach 100s with occasional PVCs, Run of NSVT overnight in setting of hypokalemia. Personally reviewed.   EKG   Sinus Tach 117 on admit.    Labs    CBC  Recent Labs  11/14/16 0811 11/15/16 0420  WBC 18.4* 15.8*  HGB 12.8* 13.2  HCT 38.5* 39.9  MCV 86.7 86.4  PLT 264 294   Basic Metabolic Panel  Recent Labs  11/14/16 0811 11/15/16 0420  NA 136 135  K 2.9* 3.2*  CL 92* 88*  CO2 34* 38*  GLUCOSE 209* 211*  BUN 20 26*  CREATININE 0.97 1.11  CALCIUM 8.3* 8.1*   Liver Function Tests No results for input(s): AST, ALT, ALKPHOS, BILITOT, PROT, ALBUMIN in the last 72 hours. No results for input(s): LIPASE, AMYLASE in the last 72 hours. Cardiac Enzymes No results for input(s): CKTOTAL, CKMB, CKMBINDEX, TROPONINI in the last 72 hours.  BNP: BNP (last 3 results)  Recent Labs  11/06/16 1621  BNP 961.6*    ProBNP (last 3 results) No results for input(s): PROBNP in the last 8760 hours.   D-Dimer No results for input(s): DDIMER in the last 72 hours. Hemoglobin A1C  Recent Labs  11/13/16 0811  HGBA1C 8.0*   Fasting Lipid Panel No  results for input(s): CHOL, HDL, LDLCALC, TRIG, CHOLHDL, LDLDIRECT in the last 72 hours. Thyroid Function Tests No results for input(s): TSH, T4TOTAL, T3FREE, THYROIDAB in the last 72 hours.  Invalid input(s): FREET3  Other results:   Imaging    No results found.   Medications:     Scheduled Medications: . aspirin EC  81 mg Oral Daily  . digoxin  0.125 mg Oral Daily  . hydrALAZINE  25 mg Oral Q8H  . insulin aspart  0-15 Units Subcutaneous TID WC  . insulin aspart  0-5 Units Subcutaneous QHS  . insulin aspart  4 Units Subcutaneous TID WC  . insulin glargine  20 Units Subcutaneous Daily  . isosorbide mononitrate  30 mg Oral Daily  . metFORMIN  500 mg Oral BID WC  . potassium chloride  60 mEq Oral TID  . sacubitril-valsartan  1 tablet Oral BID  . sodium chloride flush  10-40 mL Intracatheter Q12H  . spironolactone  12.5 mg Oral Daily  . torsemide  40 mg Oral BID  . Warfarin - Pharmacist Dosing Inpatient   Does not apply q1800    Infusions: . sodium chloride    . heparin 1,950 Units/hr (11/15/16 0645)  . methylPREDNISolone (SOLU-MEDROL) injection Stopped (11/14/16 0914)    PRN Medications: sodium chloride, acetaminophen, menthol-cetylpyridinium, ondansetron (ZOFRAN) IV, sodium chloride flush    Patient Profile  Benjamin Sherman is a 46 y.o. male with no previous medical history  Presented with worsening SOB and LE edema. New diagnosis of systolic CHF  Assessment/Plan   1. 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 Possible thrombus, CMRI completed with EF 22% component of iCM +/- eosinophilic CM.  - Cath 10/26 with normal cors. 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 No eos on CBC but this does not exclude.  - With tenuous course, empiric steroids started 10/27. - Continue solumedorl 1g per day for 5 days then taper discussed with PharmD. Finishes today.  - Coox stable this am at 66.6 -  Volume status continues to improve. CVP now 6 - Transitioned to torsemide 40 mg BID yesterday.  - Continue Entresto 24/26  - Increase spiro to 25 mg daily with hypokalemia.  - No BB with low output this admit.  - Consider corlanor. Will discuss with MD.  - Continue digoxin 0.125 mg daily.  - Continue hydralazine 50 mg TID.  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   3. Hypothyroidism - TSH 8.4 on admit ( Ref Range 0.350 - 4.500) - T3 1.6 T4 within normal range.   -  No change.   4. LV thrombus -Continue heparin.  -Continue coumadin. INR 1.63 yesterday. Pending this am.  - Stop heparin when INR 2.0 or greater. Discussed with Pharm D.   5. New DM2 Diagnosis  - Hyperglycemia this admit 2/2 to steroid use. Though Hgb A1C 8.0.  Needs PCP follow up to manage. Sees [facility] next week.  - Appreciate DM coordinator input.  - Continue to cover with sliding scale while input.   6. Hypokalemia - Supp ordered.   Likely home in next 24-48 hours. INR pending this am. Volume status much improved. He is down a total of 43 lbs this admission.   He does not have insurance. He says he can afford some of his medications. Will have our Nurse Navigator see to help cover his cardiac meds through the HF fund, new HF education, and possible paramedicine program involvement.   Continue to keep inpatient for medication adjustment and addressing social issues prior to d/c.  Length of Stay: [address]  Graciella Freer, Cordelia Poche  11/15/2016, 7:51 AM  Advanced Heart Failure Team Pager [phone] (M-F; 7a - 4p)  Please contact [facility] for night-coverage after hours (4p -7a ) and weekends on [email or web address]  Patient seen and examined with the above-signed Advanced Practice Provider and/or Housestaff. I personally reviewed laboratory data, imaging studies and relevant notes. I independently examined the patient and  formulated the important aspects of the plan. I have edited the note to reflect any of my changes or salient points. I have personally discussed the plan with the patient and/or family.  Continues to improve. Volume status ok. INR now therapeutic. That said remains tachycardic. Will continue steroids for possible eosinophilic myocarditis. Repeat echo in am. Consolidate HF meds.   Possibly home in am with close f/u in HF Clinic.  [name], MD  5:50 PM

## 2016-11-16 ENCOUNTER — Inpatient Hospital Stay (HOSPITAL_COMMUNITY): Admit: 2016-11-16 | Payer: Self-pay

## 2016-11-16 ENCOUNTER — Inpatient Hospital Stay (HOSPITAL_COMMUNITY): Payer: Medicaid Other

## 2016-11-16 DIAGNOSIS — I509 Heart failure, unspecified: Secondary | ICD-10-CM

## 2016-11-16 LAB — COOXEMETRY PANEL
Carboxyhemoglobin: 1.4 % (ref 0.5–1.5)
Carboxyhemoglobin: 1.8 % — ABNORMAL HIGH (ref 0.5–1.5)
Carboxyhemoglobin: 0.9 % (ref 0.5–1.5)
Methemoglobin: 0.7 % (ref 0.0–1.5)
Methemoglobin: 0.6 % (ref 0.0–1.5)
Methemoglobin: 0.9 % (ref 0.0–1.5)
O2 Saturation: 50.3 %
O2 Saturation: 91.6 %
O2 Saturation: 52.3 %
Total hemoglobin: 15.3 g/dL (ref 12.0–16.0)
Total hemoglobin: 15.4 g/dL (ref 12.0–16.0)
Total hemoglobin: 15.2 g/dL (ref 12.0–16.0)

## 2016-11-16 LAB — BASIC METABOLIC PANEL
Anion gap: 10 (ref 5–15)
BUN: 31 mg/dL — ABNORMAL HIGH (ref 6–20)
CO2: 39 mmol/L — ABNORMAL HIGH (ref 22–32)
Calcium: 8.8 mg/dL — ABNORMAL LOW (ref 8.9–10.3)
Chloride: 87 mmol/L — ABNORMAL LOW (ref 101–111)
Creatinine, Ser: 1.16 mg/dL (ref 0.61–1.24)
GFR calc Af Amer: >60 mL/min (ref >60)
GFR calc non Af Amer: >60 mL/min (ref >60)
Glucose, Bld: 208 mg/dL — ABNORMAL HIGH (ref 65–99)
Potassium: 3.9 mmol/L (ref 3.5–5.1)
Sodium: 136 mmol/L (ref 135–145)

## 2016-11-16 LAB — ECHOCARDIOGRAM COMPLETE
Height: 72 in
Weight: 3300.8 oz

## 2016-11-16 LAB — GLUCOSE, CAPILLARY
Glucose-Capillary: 226 mg/dL — ABNORMAL HIGH (ref 65–99)
Glucose-Capillary: 185 mg/dL — ABNORMAL HIGH (ref 65–99)
Glucose-Capillary: 196 mg/dL — ABNORMAL HIGH (ref 65–99)

## 2016-11-16 LAB — PROTIME-INR
INR: 2.14
Prothrombin Time: 23.7 seconds — ABNORMAL HIGH (ref 11.4–15.2)

## 2016-11-16 LAB — CBC
HCT: 44.2 % (ref 39.0–52.0)
Hemoglobin: 14.8 g/dL (ref 13.0–17.0)
MCH: 28.8 pg (ref 26.0–34.0)
MCHC: 33.5 g/dL (ref 30.0–36.0)
MCV: 86.2 fL (ref 78.0–100.0)
Platelets: 279 10*3/uL (ref 150–400)
RBC: 5.13 MIL/uL (ref 4.22–5.81)
RDW: 15.7 % — ABNORMAL HIGH (ref 11.5–15.5)
WBC: 13.8 10*3/uL — ABNORMAL HIGH (ref 4.0–10.5)

## 2016-11-16 MED ORDER — TORSEMIDE 20 MG PO TABS: 40.0000 mg | ORAL_TABLET | Freq: Every day | ORAL | Status: DC

## 2016-11-16 MED ORDER — POTASSIUM CHLORIDE CRYS ER 20 MEQ PO TBCR: 40.0000 meq | EXTENDED_RELEASE_TABLET | Freq: Every day | ORAL | Status: DC

## 2016-11-16 MED ORDER — PREDNISONE 50 MG PO TABS: 100.0000 mg | ORAL_TABLET | Freq: Every day | ORAL | 0 refills | Status: DC

## 2016-11-16 MED ORDER — HYDRALAZINE HCL 25 MG PO TABS: 25.0000 mg | ORAL_TABLET | Freq: Three times a day (TID) | ORAL | Status: DC

## 2016-11-16 MED ORDER — ASPIRIN 81 MG PO TBEC: 81.0000 mg | DELAYED_RELEASE_TABLET | Freq: Every day | ORAL | 6 refills | Status: DC

## 2016-11-16 MED ORDER — PREDNISONE 20 MG PO TABS: 60.0000 mg | ORAL_TABLET | Freq: Every day | ORAL | 0 refills | Status: DC

## 2016-11-16 MED ORDER — PREDNISONE 20 MG PO TABS: 40.0000 mg | ORAL_TABLET | Freq: Every day | ORAL | 0 refills | Status: DC

## 2016-11-16 MED ORDER — WARFARIN SODIUM 5 MG PO TABS: 5.0000 mg | ORAL_TABLET | Freq: Every day | ORAL | 2 refills | Status: DC

## 2016-11-16 MED ORDER — PREDNISONE 10 MG PO TABS: 10.0000 mg | ORAL_TABLET | Freq: Every day | ORAL | 0 refills | Status: DC

## 2016-11-16 MED ORDER — POTASSIUM CHLORIDE CRYS ER 20 MEQ PO TBCR
40.0000 meq | EXTENDED_RELEASE_TABLET | Freq: Two times a day (BID) | ORAL | Status: DC
  Administered 2016-11-16: 40 meq via ORAL

## 2016-11-16 MED ORDER — HYDRALAZINE HCL 25 MG PO TABS: 37.5000 mg | ORAL_TABLET | Freq: Three times a day (TID) | ORAL | Status: DC

## 2016-11-16 MED ORDER — IVABRADINE HCL 5 MG PO TABS: 2.5000 mg | ORAL_TABLET | Freq: Two times a day (BID) | ORAL | Status: DC

## 2016-11-16 MED ORDER — PREDNISONE 5 MG PO TABS: 5.0000 mg | ORAL_TABLET | Freq: Every day | ORAL | 0 refills | Status: DC

## 2016-11-16 MED ORDER — HYDRALAZINE HCL 25 MG PO TABS: 12.5000 mg | ORAL_TABLET | Freq: Three times a day (TID) | ORAL | Status: DC

## 2016-11-16 MED ORDER — DIGOXIN 125 MCG PO TABS: 0.1250 mg | ORAL_TABLET | Freq: Every day | ORAL | Status: DC

## 2016-11-16 MED ORDER — SPIRONOLACTONE 25 MG PO TABS: 25.0000 mg | ORAL_TABLET | Freq: Every day | ORAL | Status: DC

## 2016-11-16 MED ORDER — LIDOCAINE-EPINEPHRINE 1 %-1:100000 IJ SOLN
INTRAMUSCULAR | Status: AC
  Filled 2016-11-16: qty 1

## 2016-11-16 MED ORDER — SACUBITRIL-VALSARTAN 24-26 MG PO TABS: 1.0000 | ORAL_TABLET | Freq: Two times a day (BID) | ORAL | Status: DC

## 2016-11-16 MED ORDER — METFORMIN HCL 500 MG PO TABS: 500.0000 mg | ORAL_TABLET | Freq: Two times a day (BID) | ORAL | 3 refills | Status: DC

## 2016-11-16 MED ORDER — PERFLUTREN LIPID MICROSPHERE
1.0000 mL | INTRAVENOUS | Status: AC | PRN
  Administered 2016-11-16: 2 mL via INTRAVENOUS
  Filled 2016-11-16: qty 10

## 2016-11-16 MED ORDER — PREDNISONE 20 MG PO TABS: 20.0000 mg | ORAL_TABLET | Freq: Every day | ORAL | 0 refills | Status: DC

## 2016-11-16 MED ORDER — PREDNISONE 20 MG PO TABS: 80.0000 mg | ORAL_TABLET | Freq: Every day | ORAL | 0 refills | Status: DC

## 2016-11-16 MED ORDER — WARFARIN SODIUM 2 MG PO TABS
2.0000 mg | ORAL_TABLET | Freq: Once | ORAL | Status: AC
  Administered 2016-11-16: 2 mg via ORAL
  Filled 2016-11-16: qty 1

## 2016-11-16 MED ORDER — ISOSORBIDE MONONITRATE ER 30 MG PO TB24: 30.0000 mg | ORAL_TABLET | Freq: Every day | ORAL | Status: DC

## 2016-11-16 MED FILL — hydrALAZINE HCL 25 MG TABS: 25 | 34 days supply | Qty: 51 | Fill #0

## 2016-11-16 MED FILL — DIGOXIN 0.125 MG TABLET: 125 | 34 days supply | Qty: 34 | Fill #0

## 2016-11-16 MED FILL — SPIRONOLACTONE 25 MG TABLET: 25 | 34 days supply | Qty: 34 | Fill #0

## 2016-11-16 MED FILL — TORSEMIDE 20 MG TABLET: 20 | 50 days supply | Qty: 100 | Fill #0

## 2016-11-16 MED FILL — ISOSORBIDE MN ER 30 MG TAB: 30 | 34 days supply | Qty: 34 | Fill #0

## 2016-11-16 NOTE — Progress Notes (Signed)
  Paged with soft pressures in 70-80s.   Seen in room with Dr. Gala Romney.   Eating and drinking well, so will not give fluid at this time.   Decrease hydral to 12.5 mg TID Decrease torsemide 40 mg daily.   If pressures improve through the afternoon, may still send home. Otherwise will need to watch overnight.     Casimiro Needle [address]" [place], PA-C 11/16/2016 1:37 PM

## 2016-11-16 NOTE — Progress Notes (Signed)
Advanced Heart Failure Rounding Note  Primary Cardiologist: New (Dr. Tenny Craw) HF: New (Dr. 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   Co-ox 50.3% this am.   added to augment IV diuresis 11/13/16. Entresto also started.   Negative 4.2 L and down another 8 lbs. Down 51 lbs total. CVP 4-5. Creatinine stable at 1.16. INR 2.14 (Heparin stopped 11/15/16.)  Feeling good this am. Denies SOB. No lightheadedness or dizziness. Anxious to go home. No CP or palpitations.   Cath 10/26 with normal coronaries with elevated filling pressures R>L. CI = 2.2 l/min/m2  cMRI results from 10/24 reviewed with him:  1. Mildly dilated left ventricle with severe diffuse hypokinesis, EF 22%. 2. There are thrombi noted along the mid-apical anteroseptal and anterior walls as well as the apical inferior wall. 3.  Mildly dilated RV with moderate to severe hypokinesis. 4. There is a pattern of diffuse subendocardial < 50% wall thickness LGE along the mid to apical anterior and anteroseptal wall, the apex, the apical lateral wall, and the apical inferior wall. This could be due to coronary disease within LAD territory but the diffuse nature of the LGE and the multiple thrombi do raise my concern for the possibility of Eosinophilic myocarditis. If this is coronary disease, < 50% wall thickness LGE suggests that the hypokinetic territories may be viable.  Objective:   Weight Range: 206 lb 4.8 oz (93.6 kg) Body mass index is 27.98 kg/m.   Vital Signs:   Temp:  [97.7 F (36.5 C)-98.2 F (36.8 C)] 97.7 F (36.5 C) (11/01 0728) Pulse Rate:  [35-109] 109 (11/01 0728) Resp:  [15-22] 18 (11/01 0728) BP: (97-115)/(62-85) 113/85 (11/01 0728) SpO2:  [97 %-100 %] 100 % (11/01 0728) Weight:  [206 lb 4.8 oz (93.6 kg)] 206 lb 4.8 oz (93.6 kg) (11/01 0418) Last BM Date: 11/12/16  Weight change: Filed Weights   11/14/16 0429 11/15/16 0530 11/16/16  0418  Weight: 223 lb 14.4 oz (101.6 kg) 214 lb (97.1 kg) 206 lb 4.8 oz (93.6 kg)    Intake/Output:   Intake/Output Summary (Last 24 hours) at 11/16/16 1610 Last data filed at 11/16/16 0615  Gross per 24 hour  Intake              480 ml  Output             4650 ml  Net            -4170 ml     Physical Exam   CVP 4-5  General: Sitting on edge of bed, NAD.  HEENT: Normal Neck: Supple. JVP not elevated. Carotids 2+ bilat; no bruits. No thyromegaly or nodule noted. Cor: PMI nondisplaced. Regular, tachy. +S3 Lungs: CTAB, normal effort. Abdomen: Soft, non-tender, non-distended, no HSM. No bruits or masses. +BS  Extremities: No cyanosis, clubbing, or rash. Trace ankle edema.   Neuro: Alert & orientedx3, cranial nerves grossly intact. moves all 4 extremities w/o difficulty. Affect pleasant   Telemetry    Sinus tach 100-110s with PVCs, Personally reviewed.   EKG   Sinus Tach 117 on admit.    Labs    CBC  Recent Labs  11/15/16 0420 11/16/16 0426  WBC 15.8* 13.8*  HGB 13.2 14.8  HCT 39.9 44.2  MCV 86.4 86.2  PLT 294 279   Basic Metabolic Panel  Recent Labs  11/15/16 0420 11/16/16 0426  NA 135 136  K 3.2* 3.9  CL 88*  87*  CO2 38* 39*  GLUCOSE 211* 208*  BUN 26* 31*  CREATININE 1.11 1.16  CALCIUM 8.1* 8.8*   Liver Function Tests No results for input(s): AST, ALT, ALKPHOS, BILITOT, PROT, ALBUMIN in the last 72 hours. No results for input(s): LIPASE, AMYLASE in the last 72 hours. Cardiac Enzymes No results for input(s): CKTOTAL, CKMB, CKMBINDEX, TROPONINI in the last 72 hours.  BNP: BNP (last 3 results)  Recent Labs  11/06/16 1621  BNP 961.6*    ProBNP (last 3 results) No results for input(s): PROBNP in the last 8760 hours.   D-Dimer No results for input(s): DDIMER in the last 72 hours. Hemoglobin A1C No results for input(s): HGBA1C in the last 72 hours. Fasting Lipid Panel No results for input(s): CHOL, HDL, LDLCALC, TRIG, CHOLHDL, LDLDIRECT  in the last 72 hours. Thyroid Function Tests No results for input(s): TSH, T4TOTAL, T3FREE, THYROIDAB in the last 72 hours.  Invalid input(s): FREET3  Other results:   Imaging    No results found.   Medications:     Scheduled Medications: . aspirin EC  81 mg Oral Daily  . digoxin  0.125 mg Oral Daily  . hydrALAZINE  25 mg Oral Q8H  . insulin aspart  0-15 Units Subcutaneous TID WC  . insulin aspart  0-5 Units Subcutaneous QHS  . insulin aspart  4 Units Subcutaneous TID WC  . insulin glargine  20 Units Subcutaneous Daily  . isosorbide mononitrate  30 mg Oral Daily  . ivabradine  2.5 mg Oral BID WC  . metFORMIN  500 mg Oral BID WC  . potassium chloride  60 mEq Oral BID  . predniSONE  100 mg Oral Q breakfast   Followed by  . [START ON 11/30/2016] predniSONE  80 mg Oral Q breakfast   Followed by  . [START ON 12/14/2016] predniSONE  60 mg Oral Q breakfast   Followed by  . [START ON 12/28/2016] predniSONE  40 mg Oral Q breakfast   Followed by  . [START ON 01/11/2017] predniSONE  20 mg Oral Q breakfast   Followed by  . [START ON 01/25/2017] predniSONE  10 mg Oral Q breakfast   Followed by  . [START ON 02/08/2017] predniSONE  5 mg Oral Q breakfast  . sacubitril-valsartan  1 tablet Oral BID  . sodium chloride flush  10-40 mL Intracatheter Q12H  . spironolactone  25 mg Oral Daily  . torsemide  40 mg Oral BID  . Warfarin - Pharmacist Dosing Inpatient   Does not apply q1800    Infusions: . sodium chloride      PRN Medications: sodium chloride, acetaminophen, menthol-cetylpyridinium, ondansetron (ZOFRAN) IV, sodium chloride flush    Patient Profile  Benjamin Sherman is a 46 y.o. male with no previous medical history  Presented with worsening SOB and LE edema. New diagnosis of systolic CHF  Assessment/Plan   1. 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 Possible thrombus, CMRI completed with EF 22% component of iCM +/- eosinophilic CM.  - Cath 10/26  with normal cors. 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 No eos on CBC but this does not exclude.  - With tenuous course, empiric steroids started 10/27. - Started on oral prednisone slow taper today. Discussed at length with pharmacy 11/15/16. Will be long course as outlined in note from Sherlynn Carbon, [place] Resident on 11/15/16.  - Coox low this am at 50%. Repeat pending.  - Volume status  stable. CVP 4-5 - Continue torsemide 40 mg BID - Continue Entresto 24/26  - Continue spiro 25 mg daily.  - No BB with low output this admit.  - Consider corlanor. Will discuss with MD.  - Continue digoxin 0.125 mg daily.  - Continue hydralazine 25 mg TID.  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   3. Hypothyroidism - TSH 8.4 on admit ( Ref Range 0.350 - 4.500) - T3 1.6 T4 within normal range.   - No change.  4. LV thrombus -Continue coumadin. INR 2.14.  - Heparin stopped 11/15/16. Have discussed with Pharm D.   5. New DM2 Diagnosis  - Hyperglycemia this admit 2/2 to steroid use. Though Hgb A1C 8.0.  Needs PCP follow up to manage. [facility].  - Appreciate DM coordinator input.  - Continue to cover with sliding scale while input. No change.   6. Hypokalemia - Improved with supp. Will cut back as to not overshoot.   7. Social - No insurance. HF fund will provide cardiac meds when he is discharged.  - [place] so not candidate for paramedicine at this time.   Stable this am, but tenuous with low coox. Repeat Echo pending. Pt anxious to go home, but if coox remains low will be high risk for re-admit so may have to continue to monitor.  Gently increase hydralazine for afterload reduction.   Length of Stay: [address]  Luane SchoolMichael Andrew Tillery, PA-C  11/16/2016, 8:22 AM  Advanced Heart Failure  Team Pager [phone] (M-F; 7a - 4p)  Please contact [facility] for night-coverage after hours (4p -7a ) and weekends on [email or web address]  Patient seen and examined with the above-signed Advanced Practice Provider and/or Housestaff. I personally reviewed laboratory data, imaging studies and relevant notes. I independently examined the patient and formulated the important aspects of the plan. I have edited the note to reflect any of my changes or salient points. I have personally discussed the plan with the patient and/or family.  He remains tenuous but I think this is as good as we can get him. Echo reviewed personally and EF unchanged at 15%. We reviewed medicines very closely. Will let him go home today with close f/u in HF Clinic. Knows to call with any worsening.   Arvilla MeresBensimhon, Mykelle Cockerell, MD  4:17 PM

## 2016-11-16 NOTE — Progress Notes (Signed)
Instructed pt on procedure. HOB less than 45*. Held breath with line removal. Pressure held for 5 min. Instructed pt to report any s/sx of bleeding, and to remain in bed for 30 min. Instructed to leave drsg CDI for 24 hours. Pt VU. Benjamin Morrow, RN VAST

## 2016-11-16 NOTE — Progress Notes (Signed)
CARDIAC REHAB PHASE I   PRE:  Rate/Rhythm: 106 ST  BP:  Sitting: 89/62      SaO2: 100 RA  MODE:  Ambulation: 1,580 ft   POST:  Rate/Rhythm: 114 ST  BP:  Sitting: 82/50      SaO2: 100 RA  Pt ambulated 1,580 ft with IV pump. Pt denied any complaints of CP or SOB. Pt's BP was lower today, but pt was asymptomatic. RN notified. Pt walked steady and stated he was feeling great. Pt returned to recliner with call bell within reach.   [id number]    York Cerise MS, ACSM CEP  11:56 AM 11/16/2016

## 2016-11-16 NOTE — Discharge Summary (Signed)
Advanced Heart Failure Discharge Note  Discharge Summary   Patient ID: Benjamin Sherman MRN: [id number], DOB/AGE: [birth year] 48 y.o. Admit date: 11/06/2016 D/C date:     11/16/2016   Primary Discharge Diagnoses:  1. 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 Hypothyroidism 4. LV thrombus 5. New DM2 Diagnosis  6. Hypokalemia  Hospital Course:   Benjamin Sherman is a 46 y.o. male with no previous medical history admitted 11/06/16 with worsening SOB and LE edema. Found to have new diagnosis of systolic CHF.  Echo with LVEF 15% and possible thrombus.   PICC line placed for coox and CVP. Initial coox marginal at 52%.  cMRI performed which showed EF 22% and ? Component of iCM +/- eosinophillic CM, though no eosinophils noted on CBC. Started on Coumadin for confirmed LV thrombus. Bridged with heparin.   There was initially concern for viral CMP with vague history of URI several weeks prior to admit.   [facility] 11/10/16 showed normal cors and elevated biventricular pressure R>L.    Pt started on prednisone empirically with tenuous course and and concerns for eos CM by MRI. Received 5 days of IV solumedrol, and long taper started as per note on 11/15/16 by Sherlynn Carbon, Pharmacy Resident.   Repeat Echo on day of discharge showed no improvement at this point. Will continue taper as outpatient. Pt continued to diurese on IV lasix and metolazone.  HF meds adjusted as tolerated.   Hospital course complicated by elevated TSH, though T3 and T4 WNLs.  Pt also devloped hyperglycemia secondary to high dose steroids.  Hgb A1C 8.0, consistent with DM2. Arrangements made to follow up with [facility].   Pt also had hypokalemia that rapidly improved with supp and transition to oral diuretics.   Overall pt diuresed 29.2 L and down 51 lbs from admission.   11/16/16 pt developed hypotension and multiple medications, including  diuretics were cut back. Thought stable for discharge with very close follow up as below. Repeat coox day of discharge 52% (Marginal).   Arrangements made with coumadin clinic to have INR check Monday 11/20/16 at 230 pm.  I called and discussed patients complicated steroid regimen with his pharmacy personally. Spoke with Joselyn Glassman, the Pharmacist there. They agreed to fill as ordered.   Discharge Weight Range: 206 lbs Discharge Vitals: Blood pressure (!) 90/57, pulse (!) 109, temperature 97.7 F (36.5 C), temperature source Oral, resp. rate 18, height 6' (1.829 m), weight 206 lb 4.8 oz (93.6 kg), SpO2 100 %.  Labs: Lab Results  Component Value Date   WBC 13.8 (H) 11/16/2016   HGB 14.8 11/16/2016   HCT 44.2 11/16/2016   MCV 86.2 11/16/2016   PLT 279 11/16/2016     Recent Labs Lab 11/16/16 0426  NA 136  K 3.9  CL 87*  CO2 39*  BUN 31*  CREATININE 1.16  CALCIUM 8.8*  GLUCOSE 208*   Lab Results  Component Value Date   CHOL 69 11/08/2016   HDL 16 (L) 11/08/2016   LDLCALC 42 11/08/2016   TRIG 56 11/08/2016   BNP (last 3 results)  Recent Labs  11/06/16 1621  BNP 961.6*    ProBNP (last 3 results) No results for input(s): PROBNP in the last 8760 hours.   Diagnostic Studies/Procedures   No results found.  Discharge Medications   Allergies as of 11/16/2016   No Known Allergies     Medication List  STOP taking these medications   ibuprofen 200 MG tablet Commonly known as:  ADVIL,MOTRIN     TAKE these medications   aspirin 81 MG EC tablet Take 1 tablet (81 mg total) by mouth daily.   digoxin 0.125 MG tablet Commonly known as:  LANOXIN Take 1 tablet (0.125 mg total) by mouth daily.   hydrALAZINE 25 MG tablet Commonly known as:  APRESOLINE Take 0.5 tablets (12.5 mg total) by mouth every 8 (eight) hours.   isosorbide mononitrate 30 MG 24 hr tablet Commonly known as:  IMDUR Take 1 tablet (30 mg total) by mouth daily.   ivabradine 5 MG Tabs  tablet Commonly known as:  CORLANOR Take 0.5 tablets (2.5 mg total) by mouth 2 (two) times daily with a meal.   metFORMIN 500 MG tablet Commonly known as:  GLUCOPHAGE Take 1 tablet (500 mg total) by mouth 2 (two) times daily with a meal.   potassium chloride SA 20 MEQ tablet Commonly known as:  K-DUR,KLOR-CON Take 2 tablets (40 mEq total) by mouth daily.   predniSONE 50 MG tablet Commonly known as:  DELTASONE Take 2 tablets (100 mg total) by mouth daily with breakfast.   predniSONE 20 MG tablet Commonly known as:  DELTASONE Take 4 tablets (80 mg total) by mouth daily with breakfast. Start taking on:  11/30/2016   predniSONE 20 MG tablet Commonly known as:  DELTASONE Take 3 tablets (60 mg total) by mouth daily with breakfast. Start taking on:  12/14/2016   predniSONE 20 MG tablet Commonly known as:  DELTASONE Take 2 tablets (40 mg total) by mouth daily with breakfast. Start taking on:  12/28/2016   predniSONE 20 MG tablet Commonly known as:  DELTASONE Take 1 tablet (20 mg total) by mouth daily with breakfast. Start taking on:  01/11/2017   predniSONE 10 MG tablet Commonly known as:  DELTASONE Take 1 tablet (10 mg total) by mouth daily with breakfast. Start taking on:  01/25/2017   predniSONE 5 MG tablet Commonly known as:  DELTASONE Take 1 tablet (5 mg total) by mouth daily with breakfast. Start taking on:  02/08/2017   sacubitril-valsartan 24-26 MG Commonly known as:  ENTRESTO Take 1 tablet by mouth 2 (two) times daily.   spironolactone 25 MG tablet Commonly known as:  ALDACTONE Take 1 tablet (25 mg total) by mouth daily.   torsemide 20 MG tablet Commonly known as:  DEMADEX Take 2 tablets (40 mg total) by mouth daily.   warfarin 5 MG tablet Commonly known as:  COUMADIN Take 1 tablet (5 mg total) by mouth daily at 6 PM.       Disposition   The patient will be discharged in stable but tenuous condition to home.  Discharge Instructions    (HEART  FAILURE PATIENTS) Call MD:  Anytime you have any of the following symptoms: 1) 3 pound weight gain in 24 hours or 5 pounds in 1 week 2) shortness of breath, with or without a dry hacking cough 3) swelling in the hands, feet or stomach 4) if you have to sleep on extra pillows at night in order to breathe.    Complete by:  As directed    Amb Referral to Cardiac Rehabilitation    Complete by:  As directed    Diagnosis:  Heart Failure (see criteria below if ordering Phase II)   Heart Failure Type:  Chronic Systolic   Diet - low sodium heart healthy    Complete by:  As directed  Increase activity slowly    Complete by:  As directed    STOP any activity that causes chest pain, shortness of breath, dizziness, sweating, or exessive weakness    Complete by:  As directed      Follow-up Information    [facility] Follow up on 11/23/2016.   Why:  follow up appointment at 3:30 Contact information: [address] [place] [address] [phone]       [facility]. Go on 11/24/2016.   Specialty:  Cardiology Why:  1100 am for post hospital follow up.  Can enter thru Holiday representative on [address]. Underground parking on your right. The code for parking is 8001. Can also park in lower ED lot and enter thru blue awning.  Contact information: [address] [id number] mc [place] [address] [phone]       [facility] [address] [facility] Follow up on 11/20/2016.   Specialty:  Cardiology Why:  at 230 for pm for Coumadin Check.  Contact information: [address] [place] [address] [phone]            Duration of Discharge Encounter: Greater than 35 minutes   Signed, Luane School 11/16/2016, 4:21 PM   Patient seen and examined with the above-signed Advanced Practice Provider and/or Housestaff. I personally reviewed  laboratory data, imaging studies and relevant notes. I independently examined the patient and formulated the important aspects of the plan. I have edited the note to reflect any of my changes or salient points. I have personally discussed the plan with the patient and/or family.  He has severe new onset systolic HF with biventricular dysfunction felt to be possible due to eosinophilic myocarditis based on cMRI findings. He has diuresed 51 pounds. Will send him home today with very close f/u. Will remain on prednisone for extended taper. Continue current HF meds. We will see him in HF Clinic next week.   [name], MD  5:42 PM

## 2016-11-16 NOTE — Progress Notes (Signed)
  Echocardiogram 2D Echocardiogram has been performed.  Benjamin Sherman 11/16/2016, 10:08 AM

## 2016-11-16 NOTE — Progress Notes (Signed)
The following medication prescriptions have been sent to the outpatient pharmacy to be filled through the HF Fund and delivered to the patient.  Torsemide 40 mg daily  Spironolactone 25 mg daily Digoxin 0.125 daily Isosorbide mononitrate 30 mg daily Hydralazine 12.5 mg TID Entresto 24/26 mg BID and Corlanor 2.5 mg BID to be provided through samples via HF Clinic Pharmacist.

## 2016-11-16 NOTE — Progress Notes (Signed)
Order received to discharge patient.  Telemetry monitor removed and CCMD notified.  PIV access removed.  Discharge instructions, follow up, medications and instructions for their use discussed with patient. 

## 2016-11-20 ENCOUNTER — Encounter (HOSPITAL_COMMUNITY): Payer: Self-pay

## 2016-11-22 NOTE — Progress Notes (Deleted)
Patient ID: Benjamin Sherman, male   DOB: 1970-10-09, 47 y.o.   MRN: [id number] After being hospitalized 11/06/2016-11/16/2016   Primary Discharge Diagnoses:  1. 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 Hypothyroidism 4. LV thrombus 5. New DM2 Diagnosis  6. Hypokalemia   Hospital Course:  Benjamin Sherman is a 46 y.o. male with no previous medical history admitted 11/06/16 with worsening SOB and LE edema. Found to have new diagnosis of systolic CHF.  Echo with LVEF 15% and possible thrombus.   PICC line placed for coox and CVP. Initial coox marginal at 52%.  cMRI performed which showed EF 22% and ? Component of iCM +/- eosinophillic CM, though no eosinophils noted on CBC. Started on Coumadin for confirmed LV thrombus. Bridged with heparin.   There was initially concern for viral CMP with vague history of URI several weeks prior to admit.   [facility] 11/10/16 showed normal cors and elevated biventricular pressure R>L.    Pt started on prednisone empirically with tenuous course and and concerns for eos CM by MRI. Received 5 days of IV solumedrol, and long taper started as per note on 11/15/16 by Sherlynn Carbon, Pharmacy Resident.   Repeat Echo on day of discharge showed no improvement at this point. Will continue taper as outpatient. Pt continued to diurese on IV lasix and metolazone.  HF meds adjusted as tolerated.   Hospital course complicated by elevated TSH, though T3 and T4 WNLs.  Pt also devloped hyperglycemia secondary to high dose steroids.  Hgb A1C 8.0, consistent with DM2. Arrangements made to follow up with [facility].   Pt also had hypokalemia that rapidly improved with supp and transition to oral diuretics.   Overall pt diuresed 29.2 L and down 51 lbs from admission

## 2016-11-23 ENCOUNTER — Inpatient Hospital Stay: Payer: Self-pay

## 2016-11-24 ENCOUNTER — Inpatient Hospital Stay (HOSPITAL_COMMUNITY)
Admission: RE | Admit: 2016-11-24 | Discharge: 2016-11-24 | Disposition: A | Discharge status: Home / Self Care | Payer: Self-pay | Source: Ambulatory Visit

## 2016-11-28 ENCOUNTER — Encounter (HOSPITAL_COMMUNITY): Payer: Self-pay

## 2016-11-28 ENCOUNTER — Telehealth (HOSPITAL_COMMUNITY): Payer: Self-pay | Admitting: Surgery

## 2016-11-28 ENCOUNTER — Ambulatory Visit (HOSPITAL_COMMUNITY)
Admission: RE | Admit: 2016-11-28 | Discharge: 2016-11-28 | Disposition: A | Discharge status: Home / Self Care | Payer: Medicaid Other | Source: Ambulatory Visit | Attending: Cardiology | Admitting: Cardiology

## 2016-11-28 VITALS — BP 98/66 | HR 116 | Wt 207.8 lb

## 2016-11-28 DIAGNOSIS — N183 Chronic kidney disease, stage 3 (moderate): Secondary | ICD-10-CM | POA: Diagnosis not present

## 2016-11-28 DIAGNOSIS — E876 Hypokalemia: Secondary | ICD-10-CM | POA: Diagnosis not present

## 2016-11-28 DIAGNOSIS — I5042 Chronic combined systolic (congestive) and diastolic (congestive) heart failure: Secondary | ICD-10-CM | POA: Insufficient documentation

## 2016-11-28 DIAGNOSIS — I429 Cardiomyopathy, unspecified: Secondary | ICD-10-CM | POA: Insufficient documentation

## 2016-11-28 DIAGNOSIS — I513 Intracardiac thrombosis, not elsewhere classified: Secondary | ICD-10-CM

## 2016-11-28 DIAGNOSIS — F17290 Nicotine dependence, other tobacco product, uncomplicated: Secondary | ICD-10-CM | POA: Insufficient documentation

## 2016-11-28 DIAGNOSIS — E039 Hypothyroidism, unspecified: Secondary | ICD-10-CM | POA: Diagnosis not present

## 2016-11-28 DIAGNOSIS — N182 Chronic kidney disease, stage 2 (mild): Secondary | ICD-10-CM | POA: Insufficient documentation

## 2016-11-28 DIAGNOSIS — I240 Acute coronary thrombosis not resulting in myocardial infarction: Secondary | ICD-10-CM

## 2016-11-28 DIAGNOSIS — I5023 Acute on chronic systolic (congestive) heart failure: Secondary | ICD-10-CM

## 2016-11-28 DIAGNOSIS — Z79899 Other long term (current) drug therapy: Secondary | ICD-10-CM | POA: Insufficient documentation

## 2016-11-28 DIAGNOSIS — I82890 Acute embolism and thrombosis of other specified veins: Secondary | ICD-10-CM | POA: Diagnosis not present

## 2016-11-28 DIAGNOSIS — Z7982 Long term (current) use of aspirin: Secondary | ICD-10-CM | POA: Diagnosis not present

## 2016-11-28 DIAGNOSIS — E118 Type 2 diabetes mellitus with unspecified complications: Secondary | ICD-10-CM

## 2016-11-28 DIAGNOSIS — E1122 Type 2 diabetes mellitus with diabetic chronic kidney disease: Secondary | ICD-10-CM | POA: Insufficient documentation

## 2016-11-28 DIAGNOSIS — E119 Type 2 diabetes mellitus without complications: Secondary | ICD-10-CM | POA: Insufficient documentation

## 2016-11-28 LAB — BASIC METABOLIC PANEL
Anion gap: 10 (ref 5–15)
BUN: 27 mg/dL — ABNORMAL HIGH (ref 6–20)
CO2: 27 mmol/L (ref 22–32)
Calcium: 8.5 mg/dL — ABNORMAL LOW (ref 8.9–10.3)
Chloride: 93 mmol/L — ABNORMAL LOW (ref 101–111)
Creatinine, Ser: 1.75 mg/dL — ABNORMAL HIGH (ref 0.61–1.24)
GFR calc Af Amer: 51 mL/min — ABNORMAL LOW (ref >60)
GFR calc non Af Amer: 44 mL/min — ABNORMAL LOW (ref >60)
Glucose, Bld: 368 mg/dL — ABNORMAL HIGH (ref 65–99)
Potassium: 4 mmol/L (ref 3.5–5.1)
Sodium: 130 mmol/L — ABNORMAL LOW (ref 135–145)

## 2016-11-28 LAB — CBC
HCT: 39.6 % (ref 39.0–52.0)
Hemoglobin: 13.3 g/dL (ref 13.0–17.0)
MCH: 28.7 pg (ref 26.0–34.0)
MCHC: 33.6 g/dL (ref 30.0–36.0)
MCV: 85.3 fL (ref 78.0–100.0)
Platelets: 248 10*3/uL (ref 150–400)
RBC: 4.64 MIL/uL (ref 4.22–5.81)
RDW: 16.3 % — ABNORMAL HIGH (ref 11.5–15.5)
WBC: 11 10*3/uL — ABNORMAL HIGH (ref 4.0–10.5)

## 2016-11-28 LAB — MAGNESIUM: Magnesium: 1.5 mg/dL — ABNORMAL LOW (ref 1.7–2.4)

## 2016-11-28 MED ORDER — PREDNISONE 20 MG PO TABS: 60.0000 mg | ORAL_TABLET | Freq: Every day | ORAL | 0 refills | Status: DC

## 2016-11-28 MED ORDER — RIVAROXABAN 20 MG PO TABS: 20.0000 mg | ORAL_TABLET | Freq: Every day | ORAL | 11 refills | Status: DC

## 2016-11-28 MED ORDER — POTASSIUM CHLORIDE CRYS ER 20 MEQ PO TBCR: 40.0000 meq | EXTENDED_RELEASE_TABLET | Freq: Every day | ORAL | 3 refills | Status: DC

## 2016-11-28 MED FILL — POTASSIUM CL ER 20 MEQ TABL: 20 | 30 days supply | Qty: 60 | Fill #0

## 2016-11-28 MED FILL — XARELTO 20 MG TABLET: 20 | 30 days supply | Qty: 30 | Fill #0

## 2016-11-28 MED FILL — predniSONE 20 MG TABS: 20 | 14 days supply | Qty: 42 | Fill #0

## 2016-11-28 NOTE — Progress Notes (Signed)
Mr Hargens came to [facility] appt.  He is missing several medications including Potassium, Coumadin and Prednisone.  He tells myself and clinic staff that he did not have money to fill these presciptions secondary to car trouble and costs of necessary repairs.  I have sent the following new prescriptions as prescribed by Alejandro Mulling- PA to be filled through the HF Fund at the [facility]... Xaralto 20 mg daily Potassium Chloride 40 meq daily And Prednisone daily x 2 weeks then taper.  Mr Prinkey is aware of medications changes and will go directly to Outpatient Pharmacy to pick up these medications.  I have also instructed him to contact me with any concerns or questions related to medications, appointments or his HF.

## 2016-11-28 NOTE — Patient Instructions (Addendum)
START Potassium 40 meQ (2 tabs) daily START Xarelto 20 mg, one tab daily in the evening/ at dinner START Prednisone 60 mg (3 tabs) daily for two weeks STOP WARFARIN    Labs today We will only contact you if something comes back abnormal or we need to make some changes. Otherwise no news is good news!    Your physician recommends that you schedule a follow-up appointment in: 2 weeks with Benjamin Rising  Do the following things EVERYDAY: 1) Weigh yourself in the morning before breakfast. Write it down and keep it in a log. 2) Take your medicines as prescribed 3) Eat low salt foods-Limit salt (sodium) to 2000 mg per day.  4) Stay as active as you can everyday 5) Limit all fluids for the day to less than 2 liters

## 2016-11-28 NOTE — Progress Notes (Signed)
Advanced Heart Failure Clinic Note   Primary Cardiologist: Dr. Gala Romney   HPI:  Benjamin Sherman is a 46 y.o. male with history new diagnosed heart failure 10/2016, NICM, ? eosinophilic cardiomyopathy, LV thrombus, hypothyroid, CKD Stage II-III.   Admitted 10/22 with increased dyspnea and lower extremity edema. ECHO showed severely reduced EF. 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 Placed on steroids and will continue on steroid taper. Diuresed with IV lasix and transitioned to torsemide 40 mg daily. Discharge weight 206 pounds.   Today he returns for post hospital HF follow up. Feeling great overall.  Unfortunately, he never went to pharmacy, so he has not been on steroids or coumadin since discharge. Denies stroke like symptoms, such as blurred vision, unilateral weakness, or slurred speech. Denies SOB. No DOE walking around block or grocery store.  He states he had to put his car in the shop and pay a large bill, so that is why he never went to pick up his medications. Denies lightheadedness or dizziness. No CP or orthopnea or PND. Walking daily and exercising on a "small trampoline".   Review of systems complete and found to be negative unless listed in HPI.    No past medical history on file.  Current Outpatient Medications  Medication Sig Dispense Refill  . aspirin EC 81 MG EC tablet Take 1 tablet (81 mg total) by mouth daily. 30 tablet 6  . digoxin (LANOXIN) 0.125 MG tablet Take 1 tablet (0.125 mg total) by mouth daily.    . hydrALAZINE (APRESOLINE) 25 MG tablet Take 0.5 tablets (12.5 mg total) by mouth every 8 (eight) hours.    . isosorbide mononitrate (IMDUR) 30 MG 24 hr tablet Take 1 tablet (30 mg total) by mouth daily.    . ivabradine (CORLANOR) 5 MG TABS tablet Take 0.5 tablets (2.5 mg total) by mouth 2 (two) times daily with a meal. 60 tablet   . sacubitril-valsartan (ENTRESTO) 24-26 MG Take 1 tablet by mouth 2 (two) times daily. 60 tablet   .  spironolactone (ALDACTONE) 25 MG tablet Take 1 tablet (25 mg total) by mouth daily.    Marland Kitchen torsemide (DEMADEX) 20 MG tablet Take 2 tablets (40 mg total) by mouth daily.    . potassium chloride SA (K-DUR,KLOR-CON) 20 MEQ tablet Take 2 tablets (40 mEq total) daily by mouth. 60 tablet 3  . predniSONE (DELTASONE) 20 MG tablet Take 3 tablets (60 mg total) daily for 14 days by mouth. 42 tablet 0  . rivaroxaban (XARELTO) 20 MG TABS tablet Take 1 tablet (20 mg total) daily with supper by mouth. 30 tablet 11   No current facility-administered medications for this encounter.     No Known Allergies    Social History   Socioeconomic History  . Marital status: Single    Spouse name: Not on file  . Number of children: Not on file  . Years of education: Not on file  . Highest education level: Not on file  Social Needs  . Financial resource strain: Not on file  . Food insecurity - worry: Not on file  . Food insecurity - inability: Not on file  . Transportation needs - medical: Not on file  . Transportation needs - non-medical: Not on file  Occupational History  . Occupation: security  Tobacco Use  . Smoking status: Former Smoker    Types: Cigars    Last attempt to quit: 11/16/2016    Years since quitting: 0.0  . Smokeless tobacco: Never Used  .  Tobacco comment: 6 cigars per wk  Substance and Sexual Activity  . Alcohol use: Yes    Alcohol/week: 8.4 oz    Types: 12 Cans of beer, 2 Shots of liquor per week  . Drug use: No  . Sexual activity: Not on file  Other Topics Concern  . Not on file  Social History Narrative  . Not on file      Family History  Problem Relation Age of Onset  . CAD Father     Vitals:   11/28/16 1424  BP: 98/66  Pulse: (!) 116  SpO2: 96%  Weight: 207 lb 12.8 oz (94.3 kg)    Wt Readings from Last 3 Encounters:  11/28/16 207 lb 12.8 oz (94.3 kg)  11/16/16 206 lb 4.8 oz (93.6 kg)     PHYSICAL EXAM: General:  Well appearing. No respiratory  difficulty HEENT: normal Neck: supple. no JVD. Carotids 2+ bilat; no bruits. No lymphadenopathy or thyromegaly appreciated. Cor: PMI laterally displaced. Regular but remains tachy.  +s3 Lungs: clear Abdomen: soft, nontender, nondistended. No hepatosplenomegaly. No bruits or masses. Good bowel sounds. Extremities: no cyanosis, clubbing, rash, edema Neuro: alert & oriented x 3, cranial nerves grossly intact. moves all 4 extremities w/o difficulty. Affect pleasant.  ASSESSMENT & PLAN:  1. Chronic Combined Systolic/Diastolic Heart Failure. NICM  - ECHO 11/2016 EF 25-30% CMRI EF 22%  - ? Eosinophilic Myocarditis- on steroid taper  - Restart prednisone at 60 mg daily x 2 weeks, and will taper down from there.  - NYHA II symptoms - Volume status stable on exam.  - Continue torsemide 40 mg daily.  - Continue ivabradine 2.5 mg BID for now.  - Continue digoxin 0.125 mg daily.  - Continue hydralalzine 12.5 mg three times a day +imdur 30 mg daily - Continue Entresto 24/26 mg BID.  - Plan to repeat ECHO in 3 months after HF meds optimized.   2. CKD Stage II-III - Check BMET today.   3. Hypothyroidism - Needs PCP as below.   4. LV Thrombus  - Will start on Xarelto 20 mg daily off=labe for LV thrombus but likely only way he will be compliant with AC. - He cannot travel back and forth to INR checks, nor afford home monitoring.  - Plan repeat echo in several months with med optimization to look for resolution on AC.   5. DMII - Needs to establish with community health and wellness as planned.   6. Hypokalemia - Resume K at 40 meq daily for now. BMET today.   HF nurse navigator assisted today with his medication and instructions.  He seems to have difficulty navigating his health care and needs, so we will utilize our HF navigator and CSW to help him.  Will attempt to keep medical regimen as straightforward as possible.   ? Intellectual delay of some type.  Would be ideal for paramedicine,  but [place] of [place].   Graciella FreerMichael Andrew Tillery, PA-C 11/28/16   Patient seen and examined with the above-signed Advanced Practice Provider and/or Housestaff. I personally reviewed laboratory data, imaging studies and relevant notes. I independently examined the patient and formulated the important aspects of the plan. I have edited the note to reflect any of my changes or salient points. I have personally discussed the plan with the patient and/or family.  He is much improved since discharge but  Has been non-complain with several medications. We spent an extensive amount of time with him in the HF Clinic  discussing need for medication compliance and trying to come up with ways to help him with his medications including using Xarelto off-label for LV thrombus.   cMRI reviewed again and is suspicious for eosinophilic myocarditis but no evidence of eosinophilia on loabs. Will continue steroid taper for now, Repeat echo in 1-2 months.   Total time spent 45 minutes. Over half that time spent discussing above.   [name], MD  6:53 PM

## 2016-11-28 NOTE — Telephone Encounter (Signed)
Patient called to ask about his AHF Clinic appointment.  He said that he had car troubles and missed appt last week.  He also says that he was unaware of rescheduled appt earlier today.  He tells me that he wishes to reschedule again.  I have added him to schedule today and informed him to submit FMLA paperwork to be completed at his appt this afternoon.

## 2016-11-29 ENCOUNTER — Other Ambulatory Visit (HOSPITAL_COMMUNITY): Payer: Self-pay | Admitting: Pharmacist

## 2016-11-29 ENCOUNTER — Encounter (HOSPITAL_COMMUNITY): Payer: Self-pay | Admitting: Pharmacist

## 2016-11-29 MED ORDER — SACUBITRIL-VALSARTAN 24-26 MG PO TABS: 1.0000 | ORAL_TABLET | Freq: Two times a day (BID) | ORAL | 11 refills | Status: DC

## 2016-11-29 MED ORDER — RIVAROXABAN 20 MG PO TABS: 20.0000 mg | ORAL_TABLET | Freq: Every day | ORAL | 3 refills | Status: DC

## 2016-11-30 ENCOUNTER — Telehealth (HOSPITAL_COMMUNITY): Payer: Self-pay

## 2016-11-30 NOTE — Telephone Encounter (Signed)
FMLA completed on behalf of patient. Faxed forms to provided # [phone] (9 total pages) to keep patient out of work through 03/16/17.  Ave Filter, RN

## 2016-12-01 ENCOUNTER — Encounter (HOSPITAL_COMMUNITY): Payer: Self-pay

## 2016-12-01 NOTE — Progress Notes (Signed)
Patietn came by CHF clinic to p/u copy of FMLA paperwork. Copy given to patient, restrictions and recommendations reviewed with patient (Doctor wrote patient out of work - 03/16/2017). Patient aware, agreeable, and appreciative. No questions, concerns, or further needs at this time.  Ave Filter, RN

## 2016-12-05 ENCOUNTER — Telehealth (HOSPITAL_COMMUNITY): Payer: Self-pay | Admitting: Pharmacist

## 2016-12-05 NOTE — Telephone Encounter (Signed)
[employer] patient assistance approved for [employer] 24-26 mg BID through 12/05/17.   Tyler Deis. Bonnye Fava, PharmD, BCPS, CPP Clinical Pharmacist Pager: [phone] Phone: [phone] 12/05/2016 4:16 PM

## 2016-12-06 ENCOUNTER — Inpatient Hospital Stay (HOSPITAL_COMMUNITY)
Admission: EM | Admit: 2016-12-06 | Discharge: 2016-12-13 | DRG: 086 | Disposition: A | Discharge status: Home Health | Payer: Medicaid Other | Attending: Internal Medicine | Admitting: Internal Medicine

## 2016-12-06 ENCOUNTER — Emergency Department (HOSPITAL_COMMUNITY): Payer: Medicaid Other

## 2016-12-06 ENCOUNTER — Encounter (HOSPITAL_COMMUNITY): Payer: Self-pay | Admitting: Emergency Medicine

## 2016-12-06 ENCOUNTER — Other Ambulatory Visit: Payer: Self-pay

## 2016-12-06 DIAGNOSIS — S066X1A Traumatic subarachnoid hemorrhage with loss of consciousness of 30 minutes or less, initial encounter: Secondary | ICD-10-CM | POA: Diagnosis present

## 2016-12-06 DIAGNOSIS — E875 Hyperkalemia: Secondary | ICD-10-CM

## 2016-12-06 DIAGNOSIS — I493 Ventricular premature depolarization: Secondary | ICD-10-CM | POA: Diagnosis not present

## 2016-12-06 DIAGNOSIS — J939 Pneumothorax, unspecified: Secondary | ICD-10-CM

## 2016-12-06 DIAGNOSIS — Z87891 Personal history of nicotine dependence: Secondary | ICD-10-CM | POA: Diagnosis not present

## 2016-12-06 DIAGNOSIS — W1830XA Fall on same level, unspecified, initial encounter: Secondary | ICD-10-CM | POA: Diagnosis present

## 2016-12-06 DIAGNOSIS — R55 Syncope and collapse: Secondary | ICD-10-CM

## 2016-12-06 DIAGNOSIS — E1165 Type 2 diabetes mellitus with hyperglycemia: Secondary | ICD-10-CM | POA: Diagnosis present

## 2016-12-06 DIAGNOSIS — I5082 Biventricular heart failure: Secondary | ICD-10-CM | POA: Diagnosis present

## 2016-12-06 DIAGNOSIS — Z91030 Bee allergy status: Secondary | ICD-10-CM

## 2016-12-06 DIAGNOSIS — J811 Chronic pulmonary edema: Secondary | ICD-10-CM

## 2016-12-06 DIAGNOSIS — R402413 Glasgow coma scale score 13-15, at hospital admission: Secondary | ICD-10-CM | POA: Diagnosis present

## 2016-12-06 DIAGNOSIS — Z8249 Family history of ischemic heart disease and other diseases of the circulatory system: Secondary | ICD-10-CM | POA: Diagnosis not present

## 2016-12-06 DIAGNOSIS — I959 Hypotension, unspecified: Secondary | ICD-10-CM

## 2016-12-06 DIAGNOSIS — N183 Chronic kidney disease, stage 3 unspecified: Secondary | ICD-10-CM

## 2016-12-06 DIAGNOSIS — E118 Type 2 diabetes mellitus with unspecified complications: Secondary | ICD-10-CM

## 2016-12-06 DIAGNOSIS — Z7952 Long term (current) use of systemic steroids: Secondary | ICD-10-CM | POA: Diagnosis not present

## 2016-12-06 DIAGNOSIS — E871 Hypo-osmolality and hyponatremia: Secondary | ICD-10-CM | POA: Diagnosis present

## 2016-12-06 DIAGNOSIS — N179 Acute kidney failure, unspecified: Secondary | ICD-10-CM | POA: Diagnosis not present

## 2016-12-06 DIAGNOSIS — I5022 Chronic systolic (congestive) heart failure: Secondary | ICD-10-CM

## 2016-12-06 DIAGNOSIS — J9602 Acute respiratory failure with hypercapnia: Secondary | ICD-10-CM

## 2016-12-06 DIAGNOSIS — I130 Hypertensive heart and chronic kidney disease with heart failure and stage 1 through stage 4 chronic kidney disease, or unspecified chronic kidney disease: Secondary | ICD-10-CM | POA: Diagnosis present

## 2016-12-06 DIAGNOSIS — E1122 Type 2 diabetes mellitus with diabetic chronic kidney disease: Secondary | ICD-10-CM | POA: Diagnosis present

## 2016-12-06 DIAGNOSIS — I428 Other cardiomyopathies: Secondary | ICD-10-CM | POA: Diagnosis present

## 2016-12-06 DIAGNOSIS — Z7901 Long term (current) use of anticoagulants: Secondary | ICD-10-CM

## 2016-12-06 DIAGNOSIS — I9589 Other hypotension: Secondary | ICD-10-CM | POA: Diagnosis present

## 2016-12-06 DIAGNOSIS — Z7982 Long term (current) use of aspirin: Secondary | ICD-10-CM

## 2016-12-06 DIAGNOSIS — IMO0002 Reserved for concepts with insufficient information to code with codable children: Secondary | ICD-10-CM

## 2016-12-06 DIAGNOSIS — E039 Hypothyroidism, unspecified: Secondary | ICD-10-CM | POA: Diagnosis present

## 2016-12-06 DIAGNOSIS — Y92512 Supermarket, store or market as the place of occurrence of the external cause: Secondary | ICD-10-CM

## 2016-12-06 DIAGNOSIS — I513 Intracardiac thrombosis, not elsewhere classified: Secondary | ICD-10-CM | POA: Diagnosis present

## 2016-12-06 DIAGNOSIS — Z79899 Other long term (current) drug therapy: Secondary | ICD-10-CM

## 2016-12-06 DIAGNOSIS — E872 Acidosis: Secondary | ICD-10-CM

## 2016-12-06 DIAGNOSIS — I5042 Chronic combined systolic (congestive) and diastolic (congestive) heart failure: Secondary | ICD-10-CM

## 2016-12-06 DIAGNOSIS — S066X9A Traumatic subarachnoid hemorrhage with loss of consciousness of unspecified duration, initial encounter: Secondary | ICD-10-CM | POA: Diagnosis present

## 2016-12-06 DIAGNOSIS — I609 Nontraumatic subarachnoid hemorrhage, unspecified: Secondary | ICD-10-CM

## 2016-12-06 DIAGNOSIS — I240 Acute coronary thrombosis not resulting in myocardial infarction: Secondary | ICD-10-CM

## 2016-12-06 HISTORY — DX: Type 2 diabetes mellitus without complications: E11.9

## 2016-12-06 HISTORY — DX: Heart failure, unspecified: I50.9

## 2016-12-06 LAB — CBC WITH DIFFERENTIAL/PLATELET
Basophils Absolute: 0 10*3/uL (ref 0.0–0.1)
Basophils Relative: 0 %
Eosinophils Absolute: 0.1 10*3/uL (ref 0.0–0.7)
Eosinophils Relative: 0 %
HCT: 47.3 % (ref 39.0–52.0)
Hemoglobin: 15.4 g/dL (ref 13.0–17.0)
Lymphocytes Relative: 17 %
Lymphs Abs: 3.1 10*3/uL (ref 0.7–4.0)
MCH: 28.1 pg (ref 26.0–34.0)
MCHC: 32.6 g/dL (ref 30.0–36.0)
MCV: 86.2 fL (ref 78.0–100.0)
Monocytes Absolute: 0.2 10*3/uL (ref 0.1–1.0)
Monocytes Relative: 1 %
Neutro Abs: 14.5 10*3/uL — ABNORMAL HIGH (ref 1.7–7.7)
Neutrophils Relative %: 82 %
Platelets: 310 10*3/uL (ref 150–400)
RBC: 5.49 MIL/uL (ref 4.22–5.81)
RDW: 15.4 % (ref 11.5–15.5)
WBC: 17.8 10*3/uL — ABNORMAL HIGH (ref 4.0–10.5)

## 2016-12-06 LAB — COMPREHENSIVE METABOLIC PANEL
ALT: 20 U/L (ref 17–63)
AST: 22 U/L (ref 15–41)
Albumin: 3 g/dL — ABNORMAL LOW (ref 3.5–5.0)
Alkaline Phosphatase: 88 U/L (ref 38–126)
Anion gap: 9 (ref 5–15)
BUN: 42 mg/dL — ABNORMAL HIGH (ref 6–20)
CO2: 29 mmol/L (ref 22–32)
Calcium: 9.4 mg/dL (ref 8.9–10.3)
Chloride: 92 mmol/L — ABNORMAL LOW (ref 101–111)
Creatinine, Ser: 1.48 mg/dL — ABNORMAL HIGH (ref 0.61–1.24)
GFR calc Af Amer: >60 mL/min (ref >60)
GFR calc non Af Amer: 54 mL/min — ABNORMAL LOW (ref >60)
Glucose, Bld: 429 mg/dL — ABNORMAL HIGH (ref 65–99)
Potassium: 5.4 mmol/L — ABNORMAL HIGH (ref 3.5–5.1)
Sodium: 130 mmol/L — ABNORMAL LOW (ref 135–145)
Total Bilirubin: 0.7 mg/dL (ref 0.3–1.2)
Total Protein: 7.5 g/dL (ref 6.5–8.1)

## 2016-12-06 LAB — TROPONIN I: Troponin I: 0.04 ng/mL (ref <0.03)

## 2016-12-06 MED ORDER — INSULIN ASPART 100 UNIT/ML ~~LOC~~ SOLN: 2.0000 [IU] | SUBCUTANEOUS | Status: DC

## 2016-12-06 MED ORDER — SODIUM CHLORIDE 0.9 % IV SOLN: 250.0000 mL | INTRAVENOUS | Status: DC | PRN

## 2016-12-06 NOTE — ED Notes (Signed)
Per dr Preston Fleeting pt had his last dose of xarlto 12/05/2016 at dinnertime.

## 2016-12-06 NOTE — ED Notes (Signed)
CRITICAL VALUE ALERT  Critical Value:  Troponin 0.04  Date & Time Notied:  12/06/2016 at 1927  Provider Notified: Preston Fleeting MD/EDP  Orders Received/Actions taken: No orders at this time

## 2016-12-06 NOTE — ED Provider Notes (Signed)
[facility] EMERGENCY DEPARTMENT Provider Note   CSN: [id number] Arrival date & time: 12/06/16  1725     History   Chief Complaint Chief Complaint  Patient presents with  . Loss of Consciousness    HPI Benjamin Sherman is a 46 y.o. male.  The history is provided by the patient.  He has history of congestive heart failure, left ventricular mural thrombus, diabetes mellitus, chronic kidney disease.  He was at [facility] when he noticed that he was getting lightheaded.  He continued to shopping, and about 1 minute later, lost consciousness.  He did hit his right occiput when he fell.  No loss of consciousness was for an uncertain amount of time.  Of note, he is anticoagulated on rivaroxaban.  He denies chest pain, heaviness, tightness, pressure.  He denies palpitations.  He denies dyspnea, nausea, vomiting.  He states he has been compliant with his medications.  Past Medical History:  Diagnosis Date  . CHF (congestive heart failure) (HCC)   . Diabetes mellitus without complication [facility])     Patient Active Problem List   Diagnosis Date Noted  . Hypothyroid 11/28/2016  . CKD (chronic kidney disease), stage II 11/28/2016  . DM II (diabetes mellitus, type II), controlled (HCC) 11/28/2016  . LV (left ventricular) mural thrombus without MI 11/28/2016  . CHF (congestive heart failure) (HCC) 11/06/2016    Past Surgical History:  Procedure Laterality Date  . RIGHT/LEFT HEART CATH AND CORONARY ANGIOGRAPHY N/A 11/10/2016   Procedure: RIGHT/LEFT HEART CATH AND CORONARY ANGIOGRAPHY;  Surgeon: Dolores Patty, MD;  Location: [facility] INVASIVE CV LAB;  Service: Cardiovascular;  Laterality: N/A;       Home Medications    Prior to Admission medications   Medication Sig Start Date End Date Taking? Authorizing Provider  aspirin EC 81 MG EC tablet Take 1 tablet (81 mg total) by mouth daily. 11/17/16   Graciella Freer, PA-C  digoxin (LANOXIN) 0.125 MG tablet Take 1 tablet (0.125 mg total)  by mouth daily. 11/17/16   Graciella Freer, PA-C  hydrALAZINE (APRESOLINE) 25 MG tablet Take 0.5 tablets (12.5 mg total) by mouth every 8 (eight) hours. 11/16/16   Graciella Freer, PA-C  isosorbide mononitrate (IMDUR) 30 MG 24 hr tablet Take 1 tablet (30 mg total) by mouth daily. 11/17/16   Graciella Freer, PA-C  ivabradine (CORLANOR) 5 MG TABS tablet Take 0.5 tablets (2.5 mg total) by mouth 2 (two) times daily with a meal. 11/16/16   Tillery, Mariam Dollar, PA-C  potassium chloride SA (K-DUR,KLOR-CON) 20 MEQ tablet Take 2 tablets (40 mEq total) daily by mouth. 11/28/16   Graciella Freer, PA-C  predniSONE (DELTASONE) 20 MG tablet Take 3 tablets (60 mg total) daily for 14 days by mouth. 11/28/16 12/12/16  Tillery, Mariam Dollar, PA-C  rivaroxaban (XARELTO) 20 MG TABS tablet Take 1 tablet (20 mg total) daily with supper by mouth. 11/29/16   Tillery, Mariam Dollar, PA-C  sacubitril-valsartan (ENTRESTO) 24-26 MG Take 1 tablet 2 (two) times daily by mouth. 11/29/16   Tillery, Mariam Dollar, PA-C  spironolactone (ALDACTONE) 25 MG tablet Take 1 tablet (25 mg total) by mouth daily. 11/17/16   Graciella Freer, PA-C  torsemide (DEMADEX) 20 MG tablet Take 2 tablets (40 mg total) by mouth daily. 11/17/16   Graciella Freer, PA-C    Family History Family History  Problem Relation Age of Onset  . CAD Father     Social History Social History   Tobacco Use  .  Smoking status: Former Smoker    Types: Cigars    Last attempt to quit: 11/16/2016    Years since quitting: 0.0  . Smokeless tobacco: Never Used  . Tobacco comment: 6 cigars per wk  Substance Use Topics  . Alcohol use: Yes    Alcohol/week: 8.4 oz    Types: 12 Cans of beer, 2 Shots of liquor per week    Comment: occ  . Drug use: No     Allergies   Bee venom   Review of Systems Review of Systems  All other systems reviewed and are negative.    Physical Exam Updated Vital Signs BP 95/64  (BP Location: Right Arm)   Pulse (!) 110   Temp 97.6 F (36.4 C) (Oral)   Resp 16   Ht 6' (1.829 m)   Wt 89.4 kg (197 lb)   SpO2 96%   BMI 26.72 kg/m   Physical Exam  Nursing note and vitals reviewed.  46 year old male, resting comfortably and in no acute distress. Vital signs are significant for borderline hypertension and mild tachycardia. Oxygen saturation is 96%, which is normal. Head is normocephalic.  Small hematoma present on the right side of the occiput. PERRLA, EOMI. Oropharynx is clear. Neck is nontender without adenopathy or JVD. Back is nontender and there is no CVA tenderness. Lungs are clear without rales, wheezes, or rhonchi. Chest is nontender. Heart has regular rate and rhythm without murmur. Abdomen is soft, flat, nontender without masses or hepatosplenomegaly and peristalsis is normoactive. Extremities have no cyanosis or edema, full range of motion is present. Skin is warm and dry without rash. Neurologic: Mental status is normal, cranial nerves are intact, there are no motor or sensory deficits.  ED Treatments / Results  Labs (all labs ordered are listed, but only abnormal results are displayed) Labs Reviewed  COMPREHENSIVE METABOLIC PANEL - Abnormal; Notable for the following components:      Result Value   Sodium 130 (*)    Potassium 5.4 (*)    Chloride 92 (*)    Glucose, Bld 429 (*)    BUN 42 (*)    Creatinine, Ser 1.48 (*)    Albumin 3.0 (*)    GFR calc non Af Amer 54 (*)    All other components within normal limits  TROPONIN I - Abnormal; Notable for the following components:   Troponin I 0.04 (*)    All other components within normal limits  CBC WITH DIFFERENTIAL/PLATELET - Abnormal; Notable for the following components:   WBC 17.8 (*)    Neutro Abs 14.5 (*)    All other components within normal limits    EKG  EKG Interpretation  Date/Time:  Wednesday December 06 2016 17:35:42 EST Ventricular Rate:  110 PR Interval:    QRS  Duration: 86 QT Interval:  321 QTC Calculation: 435 R Axis:   -63 Text Interpretation:  Sinus tachycardia Left anterior fascicular block Consider anterior infarct Nonspecific T abnormalities, lateral leads When compared with ECG of 11/10/2016, No significant change was found Confirmed by Dione Booze ([id number]) on 12/06/2016 5:42:35 PM       Radiology Ct Head Wo Contrast  Result Date: 12/06/2016 CLINICAL DATA:  Syncope/fainting at [place] today. Cardiac etiology suspected. EXAM: CT HEAD WITHOUT CONTRAST CT CERVICAL SPINE WITHOUT CONTRAST TECHNIQUE: Multidetector CT imaging of the head and cervical spine was performed following the standard protocol without intravenous contrast. Multiplanar CT image reconstructions of the cervical spine were also generated. COMPARISON:  None.  FINDINGS: CT HEAD FINDINGS Brain: Bifrontal subarachnoid hemorrhage, small on the right and moderate on the left. Mild associated edema/contusion in the left frontal lobe. Small amount of subarachnoid hemorrhage also in the left frontoparietal lobe more superiorly. Minimal blood in the circle Willis. Ill-defined low density in the left anterior temporal lobe, which may be secondary to artifact. No midline shift or hydrocephalus. Vascular: No hyperdense vessel. Skull: No fracture or focal lesion. Sinuses/Orbits: Fluid level in the left maxillary sinus with bubbly debris and high-density material. Possible all left inferior orbital fracture, age indeterminate. Other: None. CT CERVICAL SPINE FINDINGS Alignment: Straightening of normal lordosis. No traumatic subluxation. Skull base and vertebrae: No acute fracture. Skullbase is intact. Multilevel endplate changes, likely related to degenerative disc disease. Soft tissues and spinal canal: No prevertebral fluid or swelling. No visible canal hematoma. Disc levels:  Diffuse disc space narrowing and endplate spurring. Upper chest: No acute abnormality. Other: None. IMPRESSION: 1. Bifrontal  subarachnoid hemorrhage, traumatic pattern, mild to moderate amount. Probable associated edema/contusion in the left frontal lobe. Minimal blood tracks into the circle of Willis and the left frontal lobe more superiorly. 2. Vague low-density in the anterior left temporal lobe, possibly related to streak artifact/volume averaging. Consider MRI for further evaluation after coalescence of acute event. 3. Multilevel degenerative change in the cervical spine without acute fracture or subluxation. 4. Fluid level in the left maxillary sinus, with some high-density material. Possible fracture of the left inferior orbital wall of uncertain acuity. Consider face CT if there is clinical concern for acute facial injury. Critical Value/emergent results were called by telephone at the time of interpretation on 12/06/2016 at 7:47 pm to Dr. Dione BoozeAVID Marquinn Meschke , who verbally acknowledged these results. Electronically Signed   By: Rubye OaksMelanie  Ehinger M.D.   On: 12/06/2016 19:48   Ct Cervical Spine Wo Contrast  Result Date: 12/06/2016 CLINICAL DATA:  Syncope/fainting at [employer] today. Cardiac etiology suspected. EXAM: CT HEAD WITHOUT CONTRAST CT CERVICAL SPINE WITHOUT CONTRAST TECHNIQUE: Multidetector CT imaging of the head and cervical spine was performed following the standard protocol without intravenous contrast. Multiplanar CT image reconstructions of the cervical spine were also generated. COMPARISON:  None. FINDINGS: CT HEAD FINDINGS Brain: Bifrontal subarachnoid hemorrhage, small on the right and moderate on the left. Mild associated edema/contusion in the left frontal lobe. Small amount of subarachnoid hemorrhage also in the left frontoparietal lobe more superiorly. Minimal blood in the circle Willis. Ill-defined low density in the left anterior temporal lobe, which may be secondary to artifact. No midline shift or hydrocephalus. Vascular: No hyperdense vessel. Skull: No fracture or focal lesion. Sinuses/Orbits: Fluid level in  the left maxillary sinus with bubbly debris and high-density material. Possible all left inferior orbital fracture, age indeterminate. Other: None. CT CERVICAL SPINE FINDINGS Alignment: Straightening of normal lordosis. No traumatic subluxation. Skull base and vertebrae: No acute fracture. Skullbase is intact. Multilevel endplate changes, likely related to degenerative disc disease. Soft tissues and spinal canal: No prevertebral fluid or swelling. No visible canal hematoma. Disc levels:  Diffuse disc space narrowing and endplate spurring. Upper chest: No acute abnormality. Other: None. IMPRESSION: 1. Bifrontal subarachnoid hemorrhage, traumatic pattern, mild to moderate amount. Probable associated edema/contusion in the left frontal lobe. Minimal blood tracks into the circle of Willis and the left frontal lobe more superiorly. 2. Vague low-density in the anterior left temporal lobe, possibly related to streak artifact/volume averaging. Consider MRI for further evaluation after coalescence of acute event. 3. Multilevel degenerative change in the cervical  spine without acute fracture or subluxation. 4. Fluid level in the left maxillary sinus, with some high-density material. Possible fracture of the left inferior orbital wall of uncertain acuity. Consider face CT if there is clinical concern for acute facial injury. Critical Value/emergent results were called by telephone at the time of interpretation on 12/06/2016 at 7:47 pm to Dr. Dione Booze , who verbally acknowledged these results. Electronically Signed   By: Rubye Oaks M.D.   On: 12/06/2016 19:48    Procedures Procedures (including critical care time) CRITICAL CARE Performed by: Dione Booze Total critical care time: 100 minutes Critical care time was exclusive of separately billable procedures and treating other patients. Critical care was necessary to treat or prevent imminent or life-threatening deterioration. Critical care was time spent  personally by me on the following activities: development of treatment plan with patient and/or surrogate as well as nursing, discussions with consultants, evaluation of patient's response to treatment, examination of patient, obtaining history from patient or surrogate, ordering and performing treatments and interventions, ordering and review of laboratory studies, ordering and review of radiographic studies, pulse oximetry and re-evaluation of patient's condition.  Medications Ordered in ED Medications - No data to display   Initial Impression / Assessment and Plan / ED Course  I have reviewed the triage vital signs and the nursing notes.  Pertinent labs & imaging results that were available during my care of the patient were reviewed by me and considered in my medical decision making (see chart for details).  Syncopal episode with minor head trauma inpatient who is anticoagulated.  He will need to be sent for CT of head and cervical spine.  Cause of syncope is likely hypertension given borderline blood pressure and tachycardia.  However, in patient with heart failure history, need to consider underlying arrhythmia.  Other than sinus tachycardia, no suggestion of arrhythmia on ECG.  Consider dehydration.  Doubt ACS, since on review of past records, cardiac catheterization on October 26 showed normal coronary arteries but with ejection fraction of 22%.  Echocardiogram on November 1 showed ejection fraction of 25-30%.  Will check troponin and delta troponin as well as orthostatic vital signs.  Troponin has come back borderline, will await delta troponin.  CT of head does show evidence of traumatic subdural hematoma.  Patient was reevaluated and he continues to be neurologically intact.  Case was discussed with Dr. Laurence Slate of neurology service, Dr. Lovell Sheehan of neurosurgery service, and Dr. Vassie Loll of critical care service.  Also, I have discussed case with pharmacy.  He is last dose of rivaroxaban at about 6  PM yesterday.  With relatively modest bleeding, consensus decision was that he should not be put through the rapid reversal of anticoagulant protocol.  However, he does need to be observed closely.  Decision is made to admit him to ICU at [facility].  Dr. Lovell Sheehan as stated that the he feels that CT should be repeated in the morning, and he would see the patient in consultation.  Dr. Vassie Loll has accepted the patient to the ICU.  Final Clinical Impressions(s) / ED Diagnoses   Final diagnoses:  Traumatic subarachnoid hemorrhage with loss of consciousness of 30 minutes or less, initial encounter (HCC)  Anticoagulated  Chronic combined systolic and diastolic congestive heart failure (HCC)  Syncope, unspecified syncope type  Hypotension, unspecified hypotension type    ED Discharge Orders    None       Dione Booze, MD 12/06/16 2212

## 2016-12-06 NOTE — H&P (Signed)
PULMONARY / CRITICAL CARE MEDICINE   Name: Benjamin Sherman MRN: [id number] DOB: [birth year]    ADMISSION DATE:  12/06/2016 CONSULTATION DATE:  12/06/2016  REFERRING MD:  Dr. 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 Patient was shopping at [facility] and he had syncopal event then fell and hit the back of his head on tile floor. Patient woke up disoriented one minute later. CT Head with Bifrontal SAH. Sent to [facility] for further management.   Denies any weakness or numbness. No headache.   PAST MEDICAL HISTORY :  He  has a past medical history of CHF (congestive heart failure) (HCC) and Diabetes mellitus without complication (HCC).  PAST SURGICAL HISTORY: He  has a past surgical history that includes RIGHT/LEFT HEART CATH AND CORONARY ANGIOGRAPHY (N/A, 11/10/2016).  Allergies  Allergen Reactions  . Bee Venom     No current facility-administered medications on file prior to encounter.    Current Outpatient Medications on File Prior to Encounter  Medication Sig  . aspirin EC 81 MG EC tablet Take 1 tablet (81 mg total) by mouth daily.  . rivaroxaban (XARELTO) 20 MG TABS tablet Take 1 tablet (20 mg total) daily with supper by mouth.  . digoxin (LANOXIN) 0.125 MG tablet Take 1 tablet (0.125 mg total) by mouth daily.  . hydrALAZINE (APRESOLINE) 25 MG tablet Take 0.5 tablets (12.5 mg total) by mouth every 8 (eight) hours.  . isosorbide mononitrate (IMDUR) 30 MG 24 hr tablet Take 1 tablet (30 mg total) by mouth daily.  . ivabradine (CORLANOR) 5 MG TABS tablet Take 0.5 tablets (2.5 mg total) by mouth 2 (two) times daily with a meal.  . potassium chloride SA (K-DUR,KLOR-CON) 20 MEQ tablet Take 2 tablets (40 mEq total) daily by mouth.  . predniSONE (DELTASONE) 20 MG tablet Take 3  tablets (60 mg total) daily for 14 days by mouth.  . sacubitril-valsartan (ENTRESTO) 24-26 MG Take 1 tablet 2 (two) times daily by mouth.  . spironolactone (ALDACTONE) 25 MG tablet Take 1 tablet (25 mg total) by mouth daily.  Marland Kitchen torsemide (DEMADEX) 20 MG tablet Take 2 tablets (40 mg total) by mouth daily.    FAMILY HISTORY:  His indicated that the status of his father is unknown.   SOCIAL HISTORY: He  reports that he quit smoking about 2 weeks ago. His smoking use included cigars. he has never used smokeless tobacco. He reports that he drinks about 8.4 oz of alcohol per week. He reports that he does not use drugs.  REVIEW OF SYSTEMS:   All negative; except for those that are bolded, which indicate positives.  Constitutional: weight loss, weight gain, night sweats, fevers, chills, fatigue, weakness.  HEENT: headaches, sore throat, sneezing, nasal congestion, post nasal drip, difficulty swallowing, tooth/dental problems, visual complaints, visual changes, ear aches. Neuro: difficulty with speech, weakness, numbness, ataxia. CV:  chest pain, orthopnea, PND, swelling in lower extremities, dizziness, palpitations, syncope.  Resp: cough, hemoptysis, dyspnea, wheezing. GI: heartburn, indigestion, abdominal pain, nausea, vomiting, diarrhea, constipation, change in bowel habits, loss of appetite, hematemesis, melena, hematochezia.  GU: dysuria, change in color of urine, urgency or frequency, flank pain, hematuria. MSK: joint pain or swelling, decreased range of motion. Psych: change in mood or affect, depression,  anxiety, suicidal ideations, homicidal ideations. Skin: rash, itching, bruising.     VITAL SIGNS: BP 97/66   Pulse (!) 112   Temp 97.6 F (36.4 C) (Oral)   Resp (!) 24   Ht 6' (1.829 m)   Wt 89.4 kg (197 lb)   SpO2 98%   BMI 26.72 kg/m   HEMODYNAMICS:    VENTILATOR SETTINGS:    INTAKE / OUTPUT: No intake/output data recorded.  PHYSICAL EXAMINATION: General:  Flat  affect, not in distress Neuro: alert oriented X3 power is 5/5 allover. CN intact HEENT:  Moist  Cardiovascular: normal heart sounds no murmrus Lungs: clear equal air sounds bilaterally no wheezing  Abdomen:  Soft no tenderness Musculoskeletal: no edema Skin:  No rash   LABS:  BMET Recent Labs  Lab 12/06/16 1802  NA 130*  K 5.4*  CL 92*  CO2 29  BUN 42*  CREATININE 1.48*  GLUCOSE 429*    Electrolytes Recent Labs  Lab 12/06/16 1802  CALCIUM 9.4    CBC Recent Labs  Lab 12/06/16 1802  WBC 17.8*  HGB 15.4  HCT 47.3  PLT 310    Coag's No results for input(s): APTT, INR in the last 168 hours.  Sepsis Markers No results for input(s): LATICACIDVEN, PROCALCITON, O2SATVEN in the last 168 hours.  ABG No results for input(s): PHART, PCO2ART, PO2ART in the last 168 hours.  Liver Enzymes Recent Labs  Lab 12/06/16 1802  AST 22  ALT 20  ALKPHOS 88  BILITOT 0.7  ALBUMIN 3.0*    Cardiac Enzymes Recent Labs  Lab 12/06/16 1802  TROPONINI 0.04*    Glucose No results for input(s): GLUCAP in the last 168 hours.  Imaging Ct Head Wo Contrast  Result Date: 12/06/2016 CLINICAL DATA:  Syncope/fainting at [employer] today. Cardiac etiology suspected. EXAM: CT HEAD WITHOUT CONTRAST CT CERVICAL SPINE WITHOUT CONTRAST TECHNIQUE: Multidetector CT imaging of the head and cervical spine was performed following the standard protocol without intravenous contrast. Multiplanar CT image reconstructions of the cervical spine were also generated. COMPARISON:  None. FINDINGS: CT HEAD FINDINGS Brain: Bifrontal subarachnoid hemorrhage, small on the right and moderate on the left. Mild associated edema/contusion in the left frontal lobe. Small amount of subarachnoid hemorrhage also in the left frontoparietal lobe more superiorly. Minimal blood in the circle Willis. Ill-defined low density in the left anterior temporal lobe, which may be secondary to artifact. No midline shift or  hydrocephalus. Vascular: No hyperdense vessel. Skull: No fracture or focal lesion. Sinuses/Orbits: Fluid level in the left maxillary sinus with bubbly debris and high-density material. Possible all left inferior orbital fracture, age indeterminate. Other: None. CT CERVICAL SPINE FINDINGS Alignment: Straightening of normal lordosis. No traumatic subluxation. Skull base and vertebrae: No acute fracture. Skullbase is intact. Multilevel endplate changes, likely related to degenerative disc disease. Soft tissues and spinal canal: No prevertebral fluid or swelling. No visible canal hematoma. Disc levels:  Diffuse disc space narrowing and endplate spurring. Upper chest: No acute abnormality. Other: None. IMPRESSION: 1. Bifrontal subarachnoid hemorrhage, traumatic pattern, mild to moderate amount. Probable associated edema/contusion in the left frontal lobe. Minimal blood tracks into the circle of Willis and the left frontal lobe more superiorly. 2. Vague low-density in the anterior left temporal lobe, possibly related to streak artifact/volume averaging. Consider MRI for further evaluation after coalescence of acute event. 3. Multilevel degenerative change in the cervical spine without acute fracture or subluxation. 4. Fluid level in the left maxillary sinus, with some high-density material. Possible fracture  of the left inferior orbital wall of uncertain acuity. Consider face CT if there is clinical concern for acute facial injury. Critical Value/emergent results were called by telephone at the time of interpretation on 12/06/2016 at 7:47 pm to Dr. Dione BoozeAVID GLICK , who verbally acknowledged these results. Electronically Signed   By: Rubye OaksMelanie  Ehinger M.D.   On: 12/06/2016 19:48   Ct Cervical Spine Wo Contrast  Result Date: 12/06/2016 CLINICAL DATA:  Syncope/fainting at [employer] today. Cardiac etiology suspected. EXAM: CT HEAD WITHOUT CONTRAST CT CERVICAL SPINE WITHOUT CONTRAST TECHNIQUE: Multidetector CT imaging of the  head and cervical spine was performed following the standard protocol without intravenous contrast. Multiplanar CT image reconstructions of the cervical spine were also generated. COMPARISON:  None. FINDINGS: CT HEAD FINDINGS Brain: Bifrontal subarachnoid hemorrhage, small on the right and moderate on the left. Mild associated edema/contusion in the left frontal lobe. Small amount of subarachnoid hemorrhage also in the left frontoparietal lobe more superiorly. Minimal blood in the circle Willis. Ill-defined low density in the left anterior temporal lobe, which may be secondary to artifact. No midline shift or hydrocephalus. Vascular: No hyperdense vessel. Skull: No fracture or focal lesion. Sinuses/Orbits: Fluid level in the left maxillary sinus with bubbly debris and high-density material. Possible all left inferior orbital fracture, age indeterminate. Other: None. CT CERVICAL SPINE FINDINGS Alignment: Straightening of normal lordosis. No traumatic subluxation. Skull base and vertebrae: No acute fracture. Skullbase is intact. Multilevel endplate changes, likely related to degenerative disc disease. Soft tissues and spinal canal: No prevertebral fluid or swelling. No visible canal hematoma. Disc levels:  Diffuse disc space narrowing and endplate spurring. Upper chest: No acute abnormality. Other: None. IMPRESSION: 1. Bifrontal subarachnoid hemorrhage, traumatic pattern, mild to moderate amount. Probable associated edema/contusion in the left frontal lobe. Minimal blood tracks into the circle of Willis and the left frontal lobe more superiorly. 2. Vague low-density in the anterior left temporal lobe, possibly related to streak artifact/volume averaging. Consider MRI for further evaluation after coalescence of acute event. 3. Multilevel degenerative change in the cervical spine without acute fracture or subluxation. 4. Fluid level in the left maxillary sinus, with some high-density material. Possible fracture of the  left inferior orbital wall of uncertain acuity. Consider face CT if there is clinical concern for acute facial injury. Critical Value/emergent results were called by telephone at the time of interpretation on 12/06/2016 at 7:47 pm to Dr. Dione BoozeAVID GLICK , who verbally acknowledged these results. Electronically Signed   By: Rubye OaksMelanie  Ehinger M.D.   On: 12/06/2016 19:48     STUDIES:  CT Head/C-spine 11/21 > 1. Bifrontal subarachnoid hemorrhage, traumatic pattern, mild to moderate amount. Probable associated edema/contusion in the left frontal lobe. Minimal blood tracks into the circle of Willis and the left frontal lobe more superiorly. 2. Vague low-density in the anterior left temporal lobe, possibly related to streak artifact/volume averaging. Consider MRI for further evaluation after coalescence of acute event. 3. Multilevel degenerative change in the cervical spine without acute fracture or subluxation. 4. Fluid level in the left maxillary sinus, with some high-density material. Possible fracture of the left inferior orbital wall of uncertain acuity. Consider face CT if there is clinical concern for acute facial injury.  CULTURES: None.  ANTIBIOTICS: None.  SIGNIFICANT EVENTS: 11/21 > Presents to ED   LINES/TUBES: PIV   DISCUSSION: 46 year old male on Xarelto had syncopal episode and fell and hit his head. CT with bifrontal SAH  ASSESSMENT / PLAN:  PULMONARY A: No  issues  P:   Maintain Oxygen Saturation > 92   CARDIOVASCULAR A:  Syncopal Event likely related to hypotension  H/O Combined CHF with LV and RV dysfunction  EF10% LV Thrombus on Xarelto   Troponin 0.04 >  P:  Cardiac Monitoring  Trend Troponin  Hold Xarelto  Hold Hydralazine, Imdur, Aldactone, Demadex, Digoxin    RENAL A:   Hyperkalemia  Hyponatremia  CKD Stage 3  P:   Trend BMP  Treat hyperkalemia medically if remains hyperkalemic Replace electrolytes as indicated  Urine and Serum Osmo pending    GASTROINTESTINAL A:   No issues  P:   NPO  HEMATOLOGIC A:   On Xarelto for LV Thrombus  SAH P:  Hold Anticoagulation  Kcentra stat  SCDs only  Trend CBC   INFECTIOUS A:   No issues  P:   Trend WBC and Fever Curve   ENDOCRINE A:   Hypothyroidism  DM    P:   Trend Glucose  SSI   NEUROLOGIC A:   Bifrontal SAH s/p Fall  P:   Neurosugery Following > Repeat CT Head 0600  kcentra stat Monitor  Frequent Neuro Exams    FAMILY  - Updates: none available.   - Inter-disciplinary family meet or Palliative Care meeting due by: 12/13/2016     Jovita Kussmaul, AGACNP-BC [facility]  Pgr: [phone]  PCCM Pgr: [phone]

## 2016-12-06 NOTE — ED Triage Notes (Signed)
PT brought in by [facility] and states that pt was shopping at [facility] and had LOC and fell and hit the back of his head on the tile floor. Bystanders state that pt was unable to be aroused for a few minutes then woke up disoriented. PT had recent dx of CHF and [facility] in the past few weeks. PT is now alert and oriented and just c/o back of head pain from fall.

## 2016-12-06 NOTE — ED Notes (Signed)
Due to patient's blood pressure being so low, did not get a standing ortho v/s

## 2016-12-07 ENCOUNTER — Other Ambulatory Visit (HOSPITAL_COMMUNITY): Payer: Self-pay

## 2016-12-07 ENCOUNTER — Inpatient Hospital Stay (HOSPITAL_COMMUNITY): Payer: Medicaid Other

## 2016-12-07 DIAGNOSIS — R55 Syncope and collapse: Secondary | ICD-10-CM

## 2016-12-07 DIAGNOSIS — E875 Hyperkalemia: Secondary | ICD-10-CM

## 2016-12-07 DIAGNOSIS — I959 Hypotension, unspecified: Secondary | ICD-10-CM

## 2016-12-07 DIAGNOSIS — Z7901 Long term (current) use of anticoagulants: Secondary | ICD-10-CM

## 2016-12-07 DIAGNOSIS — S066X1A Traumatic subarachnoid hemorrhage with loss of consciousness of 30 minutes or less, initial encounter: Secondary | ICD-10-CM

## 2016-12-07 LAB — POTASSIUM: Potassium: 5.3 mmol/L — ABNORMAL HIGH (ref 3.5–5.1)

## 2016-12-07 LAB — TROPONIN I
Troponin I: 0.03 ng/mL (ref <0.03)
Troponin I: <0.03 ng/mL (ref <0.03)

## 2016-12-07 LAB — BASIC METABOLIC PANEL
Anion gap: 13 (ref 5–15)
Anion gap: 16 — ABNORMAL HIGH (ref 5–15)
BUN: 42 mg/dL — ABNORMAL HIGH (ref 6–20)
BUN: 41 mg/dL — ABNORMAL HIGH (ref 6–20)
CO2: 22 mmol/L (ref 22–32)
CO2: 19 mmol/L — ABNORMAL LOW (ref 22–32)
Calcium: 9.5 mg/dL (ref 8.9–10.3)
Calcium: 9.6 mg/dL (ref 8.9–10.3)
Chloride: 94 mmol/L — ABNORMAL LOW (ref 101–111)
Chloride: 98 mmol/L — ABNORMAL LOW (ref 101–111)
Creatinine, Ser: 1.41 mg/dL — ABNORMAL HIGH (ref 0.61–1.24)
Creatinine, Ser: 1.28 mg/dL — ABNORMAL HIGH (ref 0.61–1.24)
GFR calc Af Amer: >60 mL/min (ref >60)
GFR calc Af Amer: >60 mL/min (ref >60)
GFR calc non Af Amer: 58 mL/min — ABNORMAL LOW (ref >60)
GFR calc non Af Amer: >60 mL/min (ref >60)
Glucose, Bld: 529 mg/dL (ref 65–99)
Glucose, Bld: 140 mg/dL — ABNORMAL HIGH (ref 65–99)
Potassium: 6.1 mmol/L — ABNORMAL HIGH (ref 3.5–5.1)
Potassium: 5.3 mmol/L — ABNORMAL HIGH (ref 3.5–5.1)
Sodium: 129 mmol/L — ABNORMAL LOW (ref 135–145)
Sodium: 133 mmol/L — ABNORMAL LOW (ref 135–145)

## 2016-12-07 LAB — GLUCOSE, CAPILLARY
Glucose-Capillary: 127 mg/dL — ABNORMAL HIGH (ref 65–99)
Glucose-Capillary: 133 mg/dL — ABNORMAL HIGH (ref 65–99)
Glucose-Capillary: 137 mg/dL — ABNORMAL HIGH (ref 65–99)
Glucose-Capillary: 157 mg/dL — ABNORMAL HIGH (ref 65–99)
Glucose-Capillary: 160 mg/dL — ABNORMAL HIGH (ref 65–99)
Glucose-Capillary: 166 mg/dL — ABNORMAL HIGH (ref 65–99)
Glucose-Capillary: 169 mg/dL — ABNORMAL HIGH (ref 65–99)
Glucose-Capillary: 169 mg/dL — ABNORMAL HIGH (ref 65–99)
Glucose-Capillary: 179 mg/dL — ABNORMAL HIGH (ref 65–99)
Glucose-Capillary: 212 mg/dL — ABNORMAL HIGH (ref 65–99)
Glucose-Capillary: 213 mg/dL — ABNORMAL HIGH (ref 65–99)
Glucose-Capillary: 219 mg/dL — ABNORMAL HIGH (ref 65–99)
Glucose-Capillary: 286 mg/dL — ABNORMAL HIGH (ref 65–99)
Glucose-Capillary: 322 mg/dL — ABNORMAL HIGH (ref 65–99)
Glucose-Capillary: 359 mg/dL — ABNORMAL HIGH (ref 65–99)
Glucose-Capillary: 367 mg/dL — ABNORMAL HIGH (ref 65–99)
Glucose-Capillary: 449 mg/dL — ABNORMAL HIGH (ref 65–99)
Glucose-Capillary: 510 mg/dL (ref 65–99)
Glucose-Capillary: 542 mg/dL (ref 65–99)
Glucose-Capillary: 78 mg/dL (ref 65–99)

## 2016-12-07 LAB — CBC
HCT: 49.2 % (ref 39.0–52.0)
Hemoglobin: 16.6 g/dL (ref 13.0–17.0)
MCH: 28.3 pg (ref 26.0–34.0)
MCHC: 33.7 g/dL (ref 30.0–36.0)
MCV: 83.8 fL (ref 78.0–100.0)
Platelets: 292 10*3/uL (ref 150–400)
RBC: 5.87 MIL/uL — ABNORMAL HIGH (ref 4.22–5.81)
RDW: 15.6 % — ABNORMAL HIGH (ref 11.5–15.5)
WBC: 14.3 10*3/uL — ABNORMAL HIGH (ref 4.0–10.5)

## 2016-12-07 LAB — PROTIME-INR
INR: 0.94
Prothrombin Time: 12.5 seconds (ref 11.4–15.2)

## 2016-12-07 LAB — PHOSPHORUS
Phosphorus: 3.7 mg/dL (ref 2.5–4.6)
Phosphorus: 4.2 mg/dL (ref 2.5–4.6)

## 2016-12-07 LAB — OSMOLALITY: Osmolality: 320 mOsm/kg — ABNORMAL HIGH (ref 275–295)

## 2016-12-07 LAB — HEMOGLOBIN A1C
Hgb A1c MFr Bld: 11.5 % — ABNORMAL HIGH (ref 4.8–5.6)
Mean Plasma Glucose: 283.35 mg/dL

## 2016-12-07 LAB — OSMOLALITY, URINE: Osmolality, Ur: 422 mOsm/kg (ref 300–900)

## 2016-12-07 LAB — APTT: aPTT: 29 seconds (ref 24–36)

## 2016-12-07 LAB — MAGNESIUM
Magnesium: 2.3 mg/dL (ref 1.7–2.4)
Magnesium: 2.2 mg/dL (ref 1.7–2.4)

## 2016-12-07 LAB — MRSA PCR SCREENING: MRSA by PCR: NEGATIVE

## 2016-12-07 LAB — DIGOXIN LEVEL: Digoxin Level: 0.7 ng/mL — ABNORMAL LOW (ref 0.8–2.0)

## 2016-12-07 MED ORDER — INSULIN GLARGINE 100 UNIT/ML ~~LOC~~ SOLN
20.0000 [IU] | SUBCUTANEOUS | Status: DC
  Administered 2016-12-07 – 2016-12-08 (×2): 20 [IU] via SUBCUTANEOUS
  Filled 2016-12-07 (×3): qty 0.2

## 2016-12-07 MED ORDER — EMPTY CONTAINERS FLEXIBLE MISC
4482.0000 [IU] | Status: AC
  Administered 2016-12-07: 4482 [IU] via INTRAVENOUS
  Filled 2016-12-07: qty 4482

## 2016-12-07 MED ORDER — INSULIN ASPART 100 UNIT/ML ~~LOC~~ SOLN
0.0000 [IU] | SUBCUTANEOUS | Status: DC
  Administered 2016-12-08: 11 [IU] via SUBCUTANEOUS
  Administered 2016-12-08: 2 [IU] via SUBCUTANEOUS
  Administered 2016-12-08: 8 [IU] via SUBCUTANEOUS
  Administered 2016-12-08: 11 [IU] via SUBCUTANEOUS
  Administered 2016-12-08: 2 [IU] via SUBCUTANEOUS
  Administered 2016-12-09: 3 [IU] via SUBCUTANEOUS
  Administered 2016-12-09: 8 [IU] via SUBCUTANEOUS
  Administered 2016-12-09: 5 [IU] via SUBCUTANEOUS
  Administered 2016-12-09: 3 [IU] via SUBCUTANEOUS
  Administered 2016-12-09: 2 [IU] via SUBCUTANEOUS

## 2016-12-07 MED ORDER — SODIUM POLYSTYRENE SULFONATE 15 GM/60ML PO SUSP
15.0000 g | Freq: Once | ORAL | Status: AC
  Administered 2016-12-07: 15 g via ORAL
  Filled 2016-12-07: qty 60

## 2016-12-07 MED ORDER — FUROSEMIDE 10 MG/ML IJ SOLN
60.0000 mg | Freq: Once | INTRAMUSCULAR | Status: AC
  Administered 2016-12-07: 60 mg via INTRAVENOUS
  Filled 2016-12-07: qty 6

## 2016-12-07 MED ORDER — DEXTROSE 10 % IV SOLN: INTRAVENOUS | Status: DC | PRN

## 2016-12-07 MED ORDER — SODIUM CHLORIDE 0.9 % IV SOLN
INTRAVENOUS | Status: DC
  Administered 2016-12-07: 4.5 [IU]/h via INTRAVENOUS
  Filled 2016-12-07 (×2): qty 1

## 2016-12-07 MED ORDER — INSULIN ASPART 100 UNIT/ML IV SOLN
10.0000 [IU] | Freq: Once | INTRAVENOUS | Status: AC
  Administered 2016-12-07: 10 [IU] via INTRAVENOUS

## 2016-12-07 MED ORDER — INSULIN ASPART 100 UNIT/ML ~~LOC~~ SOLN
2.0000 [IU] | SUBCUTANEOUS | Status: DC
  Administered 2016-12-07: 6 [IU] via SUBCUTANEOUS

## 2016-12-07 NOTE — Progress Notes (Signed)
Subjective: Patient reports Patient well with minimal headache  Objective: Vital signs in last 24 hours: Temp:  [97.6 F (36.4 C)-97.9 F (36.6 C)] 97.7 F (36.5 C) (11/22 0400) Pulse Rate:  [53-119] 116 (11/22 0800) Resp:  [15-36] 24 (11/22 0800) BP: (85-109)/(64-87) 109/76 (11/22 0800) SpO2:  [91 %-100 %] 100 % (11/22 0800) Weight:  [81.6 kg (179 lb 14.3 oz)-89.4 kg (197 lb)] 81.6 kg (179 lb 14.3 oz) (11/21 2309)  Intake/Output from previous day: 11/21 0701 - 11/22 0700 In: 335.8 [P.O.:120; I.V.:35.8; IV Piggyback:180] Out: 225 [Urine:225] Intake/Output this shift: Total I/O In: -  Out: 450 [Urine:450]  Awake alert strength out of 5 no pronator drift  Lab Results: Recent Labs    12/06/16 1802 12/07/16 0137  WBC 17.8* 14.3*  HGB 15.4 16.6  HCT 47.3 49.2  PLT 310 292   BMET Recent Labs    12/06/16 1802 12/07/16 0137 12/07/16 0459  NA 130* 129*  --   K 5.4* 6.1* 5.3*  CL 92* 94*  --   CO2 29 22  --   GLUCOSE 429* 529*  --   BUN 42* 42*  --   CREATININE 1.48* 1.41*  --   CALCIUM 9.4 9.5  --     Studies/Results: Ct Head Wo Contrast  Result Date: 12/07/2016 CLINICAL DATA:  Follow-up of subarachnoid hemorrhage. EXAM: CT HEAD WITHOUT CONTRAST TECHNIQUE: Contiguous axial images were obtained from the base of the skull through the vertex without intravenous contrast. COMPARISON:  Head CT 12/06/2016 FINDINGS: Brain: Left frontal hemorrhagic contusion has increased, with intraparenchymal hematoma component now measuring 2.4 x 1.6 x 1.6 cm (volume = 3.2 cm^3). The overlying left frontal polar subarachnoid hemorrhage is also slightly increased. Right frontal pole subarachnoid blood is minimally increased. Subarachnoid blood at the anterior left temporal lobe is more apparent than on the prior study. There is also trace subarachnoid blood at the anterior right temporal lobe. There is no midline shift or other mass effect. Small amount of blood at the superior aspect of the  left sylvian fissure is unchanged. No hydrocephalus or intraventricular extension of blood. The area of hypoattenuation seen on the previous study in the anterior left temporal lobe is no longer present and was probably artifactual. Vascular: No hyperdense vessel or unexpected calcification. Skull: Normal. Negative for fracture or focal lesion. Sinuses/Orbits: Unchanged fluid level in the left maxillary sinus. Normal orbits. Other: None IMPRESSION: 1. Increased size of left frontal lobe hemorrhagic contusion and associated overlying subarachnoid blood. Mild surrounding edema but no significant mass effect. 2. The other areas of subarachnoid hemorrhage at the right frontal poles and both anterior temporal lobes are unchanged. 3. No hydrocephalus or intraventricular hemorrhage. Electronically Signed   By: Deatra RobinsonKevin  Herman M.D.   On: 12/07/2016 04:06   Ct Head Wo Contrast  Result Date: 12/06/2016 CLINICAL DATA:  Syncope/fainting at [employer] today. Cardiac etiology suspected. EXAM: CT HEAD WITHOUT CONTRAST CT CERVICAL SPINE WITHOUT CONTRAST TECHNIQUE: Multidetector CT imaging of the head and cervical spine was performed following the standard protocol without intravenous contrast. Multiplanar CT image reconstructions of the cervical spine were also generated. COMPARISON:  None. FINDINGS: CT HEAD FINDINGS Brain: Bifrontal subarachnoid hemorrhage, small on the right and moderate on the left. Mild associated edema/contusion in the left frontal lobe. Small amount of subarachnoid hemorrhage also in the left frontoparietal lobe more superiorly. Minimal blood in the circle Willis. Ill-defined low density in the left anterior temporal lobe, which may be secondary to artifact. No midline  shift or hydrocephalus. Vascular: No hyperdense vessel. Skull: No fracture or focal lesion. Sinuses/Orbits: Fluid level in the left maxillary sinus with bubbly debris and high-density material. Possible all left inferior orbital fracture, age  indeterminate. Other: None. CT CERVICAL SPINE FINDINGS Alignment: Straightening of normal lordosis. No traumatic subluxation. Skull base and vertebrae: No acute fracture. Skullbase is intact. Multilevel endplate changes, likely related to degenerative disc disease. Soft tissues and spinal canal: No prevertebral fluid or swelling. No visible canal hematoma. Disc levels:  Diffuse disc space narrowing and endplate spurring. Upper chest: No acute abnormality. Other: None. IMPRESSION: 1. Bifrontal subarachnoid hemorrhage, traumatic pattern, mild to moderate amount. Probable associated edema/contusion in the left frontal lobe. Minimal blood tracks into the circle of Willis and the left frontal lobe more superiorly. 2. Vague low-density in the anterior left temporal lobe, possibly related to streak artifact/volume averaging. Consider MRI for further evaluation after coalescence of acute event. 3. Multilevel degenerative change in the cervical spine without acute fracture or subluxation. 4. Fluid level in the left maxillary sinus, with some high-density material. Possible fracture of the left inferior orbital wall of uncertain acuity. Consider face CT if there is clinical concern for acute facial injury. Critical Value/emergent results were called by telephone at the time of interpretation on 12/06/2016 at 7:47 pm to Dr. Dione Booze , who verbally acknowledged these results. Electronically Signed   By: Rubye Oaks M.D.   On: 12/06/2016 19:48   Ct Cervical Spine Wo Contrast  Result Date: 12/06/2016 CLINICAL DATA:  Syncope/fainting at [employer] today. Cardiac etiology suspected. EXAM: CT HEAD WITHOUT CONTRAST CT CERVICAL SPINE WITHOUT CONTRAST TECHNIQUE: Multidetector CT imaging of the head and cervical spine was performed following the standard protocol without intravenous contrast. Multiplanar CT image reconstructions of the cervical spine were also generated. COMPARISON:  None. FINDINGS: CT HEAD FINDINGS Brain:  Bifrontal subarachnoid hemorrhage, small on the right and moderate on the left. Mild associated edema/contusion in the left frontal lobe. Small amount of subarachnoid hemorrhage also in the left frontoparietal lobe more superiorly. Minimal blood in the circle Willis. Ill-defined low density in the left anterior temporal lobe, which may be secondary to artifact. No midline shift or hydrocephalus. Vascular: No hyperdense vessel. Skull: No fracture or focal lesion. Sinuses/Orbits: Fluid level in the left maxillary sinus with bubbly debris and high-density material. Possible all left inferior orbital fracture, age indeterminate. Other: None. CT CERVICAL SPINE FINDINGS Alignment: Straightening of normal lordosis. No traumatic subluxation. Skull base and vertebrae: No acute fracture. Skullbase is intact. Multilevel endplate changes, likely related to degenerative disc disease. Soft tissues and spinal canal: No prevertebral fluid or swelling. No visible canal hematoma. Disc levels:  Diffuse disc space narrowing and endplate spurring. Upper chest: No acute abnormality. Other: None. IMPRESSION: 1. Bifrontal subarachnoid hemorrhage, traumatic pattern, mild to moderate amount. Probable associated edema/contusion in the left frontal lobe. Minimal blood tracks into the circle of Willis and the left frontal lobe more superiorly. 2. Vague low-density in the anterior left temporal lobe, possibly related to streak artifact/volume averaging. Consider MRI for further evaluation after coalescence of acute event. 3. Multilevel degenerative change in the cervical spine without acute fracture or subluxation. 4. Fluid level in the left maxillary sinus, with some high-density material. Possible fracture of the left inferior orbital wall of uncertain acuity. Consider face CT if there is clinical concern for acute facial injury. Critical Value/emergent results were called by telephone at the time of interpretation on 12/06/2016 at 7:47 pm to  Dr.  DAVID Preston Fleeting , who verbally acknowledged these results. Electronically Signed   By: Rubye Oaks M.D.   On: 12/06/2016 19:48    Assessment/Plan: Post injury day 1 post-bleed day 1 left frontal contusion slightly worse on follow-up CT scan. Continue observation the ICU for the next 48 hours repeat CT scan if CT is stable over 48-72 hours can consider reinitiation of anticoagulation. However this will be high risk both for hemorrhagic complication as well as thromboembolic event.  LOS: 1 day     Erica Richwine P 12/07/2016, 8:33 AM

## 2016-12-07 NOTE — H&P (Signed)
PULMONARY / CRITICAL CARE MEDICINE   Name: Benjamin Sherman MRN: [id number] DOB: [birth year]    ADMISSION DATE:  12/06/2016 CONSULTATION DATE:  12/06/2016  REFERRING MD:  Dr. 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 Patient was shopping at [facility] and he had syncopal event then fell and hit the back of his head on tile floor. Patient woke up disoriented one minute later. CT Head with Bifrontal SAH. Sent to [facility] for further management.   Denies any weakness or numbness. No headache.    Subjective:  NS saw pt Headache No distress Insulin drip  VITAL SIGNS: BP 109/76 (BP Location: Right Arm)   Pulse (!) 116   Temp 97.7 F (36.5 C) (Oral)   Resp (!) 24   Ht 6' (1.829 m)   Wt 81.6 kg (179 lb 14.3 oz)   SpO2 100%   BMI 24.40 kg/m   HEMODYNAMICS:    VENTILATOR SETTINGS:    INTAKE / OUTPUT: I/O last 3 completed shifts: In: 335.8 [P.O.:120; I.V.:35.8; IV Piggyback:180] Out: 225 [Urine:225]  PHYSICAL EXAMINATION: General: no distress Neuro: perrl 7 sluggish, fc, moves ext HEENT: jvd up PULM: coarse CV:  s1 s2 RRR  GI: soft, BS wnl, no r Extremities: edema min   LABS:  BMET Recent Labs  Lab 12/06/16 1802 12/07/16 0137 12/07/16 0459  NA 130* 129*  --   K 5.4* 6.1* 5.3*  CL 92* 94*  --   CO2 29 22  --   BUN 42* 42*  --   CREATININE 1.48* 1.41*  --   GLUCOSE 429* 529*  --     Electrolytes Recent Labs  Lab 12/06/16 1802 12/07/16 0137  CALCIUM 9.4 9.5  MG  --  2.3  PHOS  --  3.7    CBC Recent Labs  Lab 12/06/16 1802 12/07/16 0137  WBC 17.8* 14.3*  HGB 15.4 16.6  HCT 47.3 49.2  PLT 310 292    Coag's No results for input(s): APTT, INR in the last 168 hours.  Sepsis Markers No results for input(s): LATICACIDVEN, PROCALCITON,  O2SATVEN in the last 168 hours.  ABG No results for input(s): PHART, PCO2ART, PO2ART in the last 168 hours.  Liver Enzymes Recent Labs  Lab 12/06/16 1802  AST 22  ALT 20  ALKPHOS 88  BILITOT 0.7  ALBUMIN 3.0*    Cardiac Enzymes Recent Labs  Lab 12/06/16 1802 12/07/16 0024 12/07/16 0459  TROPONINI 0.04* 0.03* <0.03    Glucose Recent Labs  Lab 12/07/16 0210 12/07/16 0306 12/07/16 0410 12/07/16 0508 12/07/16 0600 12/07/16 0705  GLUCAP 542* 449* 367* 359* 322* 286*    Imaging Ct Head Wo Contrast  Result Date: 12/07/2016 CLINICAL DATA:  Follow-up of subarachnoid hemorrhage. EXAM: CT HEAD WITHOUT CONTRAST TECHNIQUE: Contiguous axial images were obtained from the base of the skull through the vertex without intravenous contrast. COMPARISON:  Head CT 12/06/2016 FINDINGS: Brain: Left frontal hemorrhagic contusion has increased, with intraparenchymal hematoma component now measuring 2.4 x 1.6 x 1.6 cm (volume = 3.2 cm^3). The overlying left frontal polar subarachnoid hemorrhage is also slightly increased. Right frontal pole subarachnoid blood is minimally increased. Subarachnoid blood at the anterior left temporal lobe is more apparent than on the prior study. There is  also trace subarachnoid blood at the anterior right temporal lobe. There is no midline shift or other mass effect. Small amount of blood at the superior aspect of the left sylvian fissure is unchanged. No hydrocephalus or intraventricular extension of blood. The area of hypoattenuation seen on the previous study in the anterior left temporal lobe is no longer present and was probably artifactual. Vascular: No hyperdense vessel or unexpected calcification. Skull: Normal. Negative for fracture or focal lesion. Sinuses/Orbits: Unchanged fluid level in the left maxillary sinus. Normal orbits. Other: None IMPRESSION: 1. Increased size of left frontal lobe hemorrhagic contusion and associated overlying subarachnoid blood. Mild  surrounding edema but no significant mass effect. 2. The other areas of subarachnoid hemorrhage at the right frontal poles and both anterior temporal lobes are unchanged. 3. No hydrocephalus or intraventricular hemorrhage. Electronically Signed   By: Deatra Robinson M.D.   On: 12/07/2016 04:06   Ct Head Wo Contrast  Result Date: 12/06/2016 CLINICAL DATA:  Syncope/fainting at [employer] today. Cardiac etiology suspected. EXAM: CT HEAD WITHOUT CONTRAST CT CERVICAL SPINE WITHOUT CONTRAST TECHNIQUE: Multidetector CT imaging of the head and cervical spine was performed following the standard protocol without intravenous contrast. Multiplanar CT image reconstructions of the cervical spine were also generated. COMPARISON:  None. FINDINGS: CT HEAD FINDINGS Brain: Bifrontal subarachnoid hemorrhage, small on the right and moderate on the left. Mild associated edema/contusion in the left frontal lobe. Small amount of subarachnoid hemorrhage also in the left frontoparietal lobe more superiorly. Minimal blood in the circle Willis. Ill-defined low density in the left anterior temporal lobe, which may be secondary to artifact. No midline shift or hydrocephalus. Vascular: No hyperdense vessel. Skull: No fracture or focal lesion. Sinuses/Orbits: Fluid level in the left maxillary sinus with bubbly debris and high-density material. Possible all left inferior orbital fracture, age indeterminate. Other: None. CT CERVICAL SPINE FINDINGS Alignment: Straightening of normal lordosis. No traumatic subluxation. Skull base and vertebrae: No acute fracture. Skullbase is intact. Multilevel endplate changes, likely related to degenerative disc disease. Soft tissues and spinal canal: No prevertebral fluid or swelling. No visible canal hematoma. Disc levels:  Diffuse disc space narrowing and endplate spurring. Upper chest: No acute abnormality. Other: None. IMPRESSION: 1. Bifrontal subarachnoid hemorrhage, traumatic pattern, mild to moderate  amount. Probable associated edema/contusion in the left frontal lobe. Minimal blood tracks into the circle of Willis and the left frontal lobe more superiorly. 2. Vague low-density in the anterior left temporal lobe, possibly related to streak artifact/volume averaging. Consider MRI for further evaluation after coalescence of acute event. 3. Multilevel degenerative change in the cervical spine without acute fracture or subluxation. 4. Fluid level in the left maxillary sinus, with some high-density material. Possible fracture of the left inferior orbital wall of uncertain acuity. Consider face CT if there is clinical concern for acute facial injury. Critical Value/emergent results were called by telephone at the time of interpretation on 12/06/2016 at 7:47 pm to Dr. Dione Booze , who verbally acknowledged these results. Electronically Signed   By: Rubye Oaks M.D.   On: 12/06/2016 19:48   Ct Cervical Spine Wo Contrast  Result Date: 12/06/2016 CLINICAL DATA:  Syncope/fainting at [employer] today. Cardiac etiology suspected. EXAM: CT HEAD WITHOUT CONTRAST CT CERVICAL SPINE WITHOUT CONTRAST TECHNIQUE: Multidetector CT imaging of the head and cervical spine was performed following the standard protocol without intravenous contrast. Multiplanar CT image reconstructions of the cervical spine were also generated. COMPARISON:  None. FINDINGS: CT HEAD FINDINGS Brain: Bifrontal subarachnoid hemorrhage, small  on the right and moderate on the left. Mild associated edema/contusion in the left frontal lobe. Small amount of subarachnoid hemorrhage also in the left frontoparietal lobe more superiorly. Minimal blood in the circle Willis. Ill-defined low density in the left anterior temporal lobe, which may be secondary to artifact. No midline shift or hydrocephalus. Vascular: No hyperdense vessel. Skull: No fracture or focal lesion. Sinuses/Orbits: Fluid level in the left maxillary sinus with bubbly debris and high-density  material. Possible all left inferior orbital fracture, age indeterminate. Other: None. CT CERVICAL SPINE FINDINGS Alignment: Straightening of normal lordosis. No traumatic subluxation. Skull base and vertebrae: No acute fracture. Skullbase is intact. Multilevel endplate changes, likely related to degenerative disc disease. Soft tissues and spinal canal: No prevertebral fluid or swelling. No visible canal hematoma. Disc levels:  Diffuse disc space narrowing and endplate spurring. Upper chest: No acute abnormality. Other: None. IMPRESSION: 1. Bifrontal subarachnoid hemorrhage, traumatic pattern, mild to moderate amount. Probable associated edema/contusion in the left frontal lobe. Minimal blood tracks into the circle of Willis and the left frontal lobe more superiorly. 2. Vague low-density in the anterior left temporal lobe, possibly related to streak artifact/volume averaging. Consider MRI for further evaluation after coalescence of acute event. 3. Multilevel degenerative change in the cervical spine without acute fracture or subluxation. 4. Fluid level in the left maxillary sinus, with some high-density material. Possible fracture of the left inferior orbital wall of uncertain acuity. Consider face CT if there is clinical concern for acute facial injury. Critical Value/emergent results were called by telephone at the time of interpretation on 12/06/2016 at 7:47 pm to Dr. Dione BoozeAVID GLICK , who verbally acknowledged these results. Electronically Signed   By: Rubye OaksMelanie  Ehinger M.D.   On: 12/06/2016 19:48     STUDIES:  CT Head/C-spine 11/21 > 1. Bifrontal subarachnoid hemorrhage, traumatic pattern, mild to moderate amount. Probable associated edema/contusion in the left frontal lobe. Minimal blood tracks into the circle of Willis and the left frontal lobe more superiorly. 2. Vague low-density in the anterior left temporal lobe, possibly related to streak artifact/volume averaging. Consider MRI for further  evaluation after coalescence of acute event. 3. Multilevel degenerative change in the cervical spine without acute fracture or subluxation. 4. Fluid level in the left maxillary sinus, with some high-density material. Possible fracture of the left inferior orbital wall of uncertain acuity. Consider face CT if there is clinical concern for acute facial injury.  CT head 11/22 3 am>>>Increased size of left frontal lobe hemorrhagic contusion and associated overlying subarachnoid blood. Mild surrounding edema but no significant mass effect. 2. The other areas of subarachnoid hemorrhage at the right frontal poles and both anterior temporal lobes are unchanged. 3. No hydrocephalus or intraventricular hemorrhage  CULTURES: None.  ANTIBIOTICS: None.  SIGNIFICANT EVENTS: 11/21 > Presents to ED   LINES/TUBES: PIV   DISCUSSION: 46 year old male on Xarelto had syncopal episode and fell and hit his head. CT with bifrontal SAH  ASSESSMENT / PLAN:  PULMONARY A: Rt effusion, pulm edema? P:   Maintain Oxygen Saturation > 92  Neg balance likely needed soon Repeat pcxr in am   CARDIOVASCULAR A:  Syncopal Event likely related to hypotension  H/O Combined CHF with LV and RV dysfunction  EF10% LV Thrombus on Xarelto   P:  Cardiac Monitoring tele maintain , with syncope  Trend Troponin - dc further Hold Xarelto  Hold Hydralazine, Imdur, Aldactone, Demadex, Digoxin   Get carotids Echo known 10%- repeat assessment  RENAL  A:   Hyperkalemia  Hyponatremia  CKD Stage 3  P:   Given K , ef, pcxr - lasix bmet to follow May need kayxlate  GASTROINTESTINAL A:   No issues  P:   NPO slp  HEMATOLOGIC A:   On Xarelto for LV Thrombus  SAH P:  Hold Anticoagulation  Kcentra given, repeat coags  INFECTIOUS A:   No issues  P:   Trend WBC and Fever Curve  pcxr in am for left  ENDOCRINE A:   Hypothyroidism  DM    P:   Trend Glucose  SSI, consider res  NEUROLOGIC A:    Bifrontal SAH s/p Fall  P:   Neurosugery Following > Repeat CT Head 0600 - I reviewed kcentra then repeAT COAGS Monitor NEUROCHECKS Frequent Neuro Exams   FAMILY  - Updates: \TO PT BY ME  - Inter-disciplinary family meet or Palliative Care meeting due by: 12/13/2016    Ccm time 30 min   Mcarthur Rossetti. Tyson Alias, MD, FACP Pgr: [phone] [facility]

## 2016-12-07 NOTE — Evaluation (Signed)
Clinical/Bedside Swallow Evaluation Patient Details  Name: Benjamin Sherman MRN: [id number] Date of Birth: 1970/11/04  Today's Date: 12/07/2016 Time: SLP Start Time (ACUTE ONLY): 1224 SLP Stop Time (ACUTE ONLY): 1232 SLP Time Calculation (min) (ACUTE ONLY): 8 min  Past Medical History:  Past Medical History:  Diagnosis Date  . CHF (congestive heart failure) (HCC)   . Diabetes mellitus without complication [facility])    Past Surgical History:  Past Surgical History:  Procedure Laterality Date  . RIGHT/LEFT HEART CATH AND CORONARY ANGIOGRAPHY N/A 11/10/2016   Procedure: RIGHT/LEFT HEART CATH AND CORONARY ANGIOGRAPHY;  Surgeon: Dolores Patty, MD;  Location: [facility] INVASIVE CV LAB;  Service: Cardiovascular;  Laterality: N/A;   HPI:  Pt is a 46 year old male on Xarelto who had a syncopal episode and fell and hit his head. CT with bifrontal SAH.  PMH combined CHF with LV and RV dysfunction with EF 15%, LV thrombus on Xarelto, DM, CKD stage 3, Hypothyroidism   Assessment / Plan / Recommendation Clinical Impression  Pt's oropharyngeal swallow appears functional with no overt difficulty or signs concerning for aspiration. Recommend regular textures and thin liquids - no SLP f/u needed for swallowing. Pt did have delayed responses to questioning and commands though and may benefit from SLP cognitive-linguistic evaluation. SLP Visit Diagnosis: Dysphagia, unspecified (R13.10)    Aspiration Risk  No limitations    Diet Recommendation Regular;Thin liquid   Liquid Administration via: Cup;Straw Medication Administration: Whole meds with liquid Supervision: Patient able to self feed;Intermittent supervision to cue for compensatory strategies Compensations: Slow rate;Small sips/bites Postural Changes: Seated upright at 90 degrees    Other  Recommendations Oral Care Recommendations: Oral care BID   Follow up Recommendations None      Frequency and Duration            Prognosis         Swallow Study   General HPI: Pt is a 46 year old male on Xarelto who had a syncopal episode and fell and hit his head. CT with bifrontal SAH.  PMH combined CHF with LV and RV dysfunction with EF 15%, LV thrombus on Xarelto, DM, CKD stage 3, Hypothyroidism Type of Study: Bedside Swallow Evaluation Previous Swallow Assessment: none in chart Diet Prior to this Study: NPO Temperature Spikes Noted: No Respiratory Status: Room air History of Recent Intubation: No Behavior/Cognition: Alert;Cooperative;Pleasant mood;Other (Comment)(delayed responses) Oral Cavity Assessment: Within Functional Limits Oral Care Completed by SLP: No Oral Cavity - Dentition: Poor condition Vision: Functional for self-feeding Self-Feeding Abilities: Able to feed self Patient Positioning: Upright in bed Baseline Vocal Quality: Normal    Oral/Motor/Sensory Function Overall Oral Motor/Sensory Function: Within functional limits   Ice Chips Ice chips: Not tested   Thin Liquid Thin Liquid: Within functional limits Presentation: Cup;Self Fed;Straw    Nectar Thick Nectar Thick Liquid: Not tested   Honey Thick Honey Thick Liquid: Not tested   Puree Puree: Within functional limits Presentation: Self Fed;Spoon   Solid   GO   Solid: Within functional limits Presentation: Self Georjean Mode 12/07/2016,12:48 PM   Maxcine Ham, M.A. CCC-SLP [phone]

## 2016-12-07 NOTE — Progress Notes (Signed)
CRITICAL VALUE ALERT  Critical Value:  Serum osmolality 320, troponin 0.03  Date & Time Notied:  11/22, 0220  Provider Notified: eLink MD  Orders Received/Actions taken: no new orders this time

## 2016-12-07 NOTE — Progress Notes (Signed)
PCCM Interval Note   Potassium 6.1 (Previous 5.4)   Serum Osmolality 320, Urine 422 > Hypertonic Hyponatremia in setting of hyperglycemia     Orders placed for 10 units of insulin, Kayexalate, Creatinine/Na/K Urine Studies Repeat K ordered every 4 hours x 3   Jovita Kussmaul, AGACNP-BC [facility]  Pgr: [phone]  PCCM Pgr: [phone]

## 2016-12-07 NOTE — Consult Note (Signed)
Reason for Consult: Traumatic subarachnoid hemorrhage, cerebral contusions Referring Physician: Dr. Macy Sherman Sherman is an 46 y.o. male.  HPI: The patient is a 47 year old black male with a history of just of heart failure, cardiac mural thrombus, Xarelto anticoagulation.  He had a syncopal event yesterday striking his head.  He was brought to the ER where head CT demonstrated a traumatic subarachnoid hemorrhage and mild cerebral contusions.  His Xarelto was discontinued, his last dose was on the evening of 12/05/2016.  The patient was admitted to the ICU for observation.  A neurosurgical consultation was requested.  Presently the patient is alert and pleasant.  He denies headache, neck pain, back pain, etc.  Past Medical History:  Diagnosis Date  . CHF (congestive heart failure) ([place])   . Diabetes mellitus without complication [facility])     Past Surgical History:  Procedure Laterality Date  . RIGHT/LEFT HEART CATH AND CORONARY ANGIOGRAPHY N/A 11/10/2016   Procedure: RIGHT/LEFT HEART CATH AND CORONARY ANGIOGRAPHY;  Surgeon: Jolaine Artist, MD;  Location: [facility];  Service: Cardiovascular;  Laterality: N/A;    Family History  Problem Relation Age of Onset  . CAD Father     Social History:  reports that he quit smoking about 3 weeks ago. His smoking use included cigars. he has never used smokeless tobacco. He reports that he drinks about 8.4 oz of alcohol per week. He reports that he does not use drugs.  Allergies:  Allergies  Allergen Reactions  . Bee Venom     Medications:  I have reviewed the patient's current medications. Prior to Admission:  Medications Prior to Admission  Medication Sig Dispense Refill Last Dose  . aspirin EC 81 MG EC tablet Take 1 tablet (81 mg total) by mouth daily. 30 tablet 6 12/06/2016 at Unknown time  . rivaroxaban (XARELTO) 20 MG TABS tablet Take 1 tablet (20 mg total) daily with supper by mouth. 90 tablet 3 12/06/2016 at 1500  .  digoxin (LANOXIN) 0.125 MG tablet Take 1 tablet (0.125 mg total) by mouth daily.   Taking  . hydrALAZINE (APRESOLINE) 25 MG tablet Take 0.5 tablets (12.5 mg total) by mouth every 8 (eight) hours.   Taking  . isosorbide mononitrate (IMDUR) 30 MG 24 hr tablet Take 1 tablet (30 mg total) by mouth daily.   Taking  . ivabradine (CORLANOR) 5 MG TABS tablet Take 0.5 tablets (2.5 mg total) by mouth 2 (two) times daily with a meal. 60 tablet  Taking  . potassium chloride SA (K-DUR,KLOR-CON) 20 MEQ tablet Take 2 tablets (40 mEq total) daily by mouth. 60 tablet 3   . predniSONE (DELTASONE) 20 MG tablet Take 3 tablets (60 mg total) daily for 14 days by mouth. 42 tablet 0   . sacubitril-valsartan (ENTRESTO) 24-26 MG Take 1 tablet 2 (two) times daily by mouth. 60 tablet 11   . spironolactone (ALDACTONE) 25 MG tablet Take 1 tablet (25 mg total) by mouth daily.   Taking  . torsemide (DEMADEX) 20 MG tablet Take 2 tablets (40 mg total) by mouth daily.   Taking   Scheduled:  Continuous: . sodium chloride    . insulin (NOVOLIN-R) infusion 11.3 Units/hr (12/07/16 0706)   RJJ:OACZYS chloride Anti-infectives (From admission, onward)   None       Results for orders placed or performed during the hospital encounter of 12/06/16 (from the past 48 hour(s))  Comprehensive metabolic panel     Status: Abnormal   Collection Time: 12/06/16  6:02 PM  Result Value Ref Range   Sodium 130 (L) 135 - 145 mmol/L   Potassium 5.4 (H) 3.5 - 5.1 mmol/L   Chloride 92 (L) 101 - 111 mmol/L   CO2 29 22 - 32 mmol/L   Glucose, Bld 429 (H) 65 - 99 mg/dL   BUN 42 (H) 6 - 20 mg/dL   Creatinine, Ser 1.48 (H) 0.61 - 1.24 mg/dL   Calcium 9.4 8.9 - 10.3 mg/dL   Total Protein 7.5 6.5 - 8.1 g/dL   Albumin 3.0 (L) 3.5 - 5.0 g/dL   AST 22 15 - 41 U/L   ALT 20 17 - 63 U/L   Alkaline Phosphatase 88 38 - 126 U/L   Total Bilirubin 0.7 0.3 - 1.2 mg/dL   GFR calc non Af Amer 54 (L) >60 mL/min   GFR calc Af Amer >60 >60 mL/min    Comment:  (NOTE) The eGFR has been calculated using the CKD EPI equation. This calculation has not been validated in all clinical situations. eGFR's persistently <60 mL/min signify possible Chronic Kidney Disease.    Anion gap 9 5 - 15  Troponin I     Status: Abnormal   Collection Time: 12/06/16  6:02 PM  Result Value Ref Range   Troponin I 0.04 (HH) <0.03 ng/mL    Comment: CRITICAL RESULT CALLED TO, READ BACK BY AND VERIFIED WITH: LONG,H ON 12/06/16 AT 1920 BY LOY,C   CBC with Differential     Status: Abnormal   Collection Time: 12/06/16  6:02 PM  Result Value Ref Range   WBC 17.8 (H) 4.0 - 10.5 K/uL   RBC 5.49 4.22 - 5.81 MIL/uL   Hemoglobin 15.4 13.0 - 17.0 g/dL   HCT 47.3 39.0 - 52.0 %   MCV 86.2 78.0 - 100.0 fL   MCH 28.1 26.0 - 34.0 pg   MCHC 32.6 30.0 - 36.0 g/dL   RDW 15.4 11.5 - 15.5 %   Platelets 310 150 - 400 K/uL   Neutrophils Relative % 82 %   Neutro Abs 14.5 (H) 1.7 - 7.7 K/uL   Lymphocytes Relative 17 %   Lymphs Abs 3.1 0.7 - 4.0 K/uL   Monocytes Relative 1 %   Monocytes Absolute 0.2 0.1 - 1.0 K/uL   Eosinophils Relative 0 %   Eosinophils Absolute 0.1 0.0 - 0.7 K/uL   Basophils Relative 0 %   Basophils Absolute 0.0 0.0 - 0.1 K/uL  MRSA PCR Screening     Status: None   Collection Time: 12/06/16 11:10 PM  Result Value Ref Range   MRSA by PCR NEGATIVE NEGATIVE    Comment:        The GeneXpert MRSA Assay (FDA approved for NASAL specimens only), is one component of a comprehensive MRSA colonization surveillance program. It is not intended to diagnose MRSA infection nor to guide or monitor treatment for MRSA infections.   Troponin I (q 6hr x 3)     Status: Abnormal   Collection Time: 12/07/16 12:24 AM  Result Value Ref Range   Troponin I 0.03 (HH) <0.03 ng/mL    Comment: CRITICAL RESULT CALLED TO, READ BACK BY AND VERIFIED WITH: JONES K,RN 12/07/16 0143 WAYK   Osmolality     Status: Abnormal   Collection Time: 12/07/16 12:24 AM  Result Value Ref Range    Osmolality 320 (H) 275 - 295 mOsm/kg    Comment: RESULT CALLED TO, READ BACK BY AND VERIFIED WITH: Perrin Maltese [id number] '@[id number]'  THANEY  Glucose, capillary     Status: Abnormal   Collection Time: 12/07/16 12:27 AM  Result Value Ref Range   Glucose-Capillary 510 (HH) 65 - 99 mg/dL  Osmolality, urine     Status: None   Collection Time: 12/07/16 12:37 AM  Result Value Ref Range   Osmolality, Ur 422 300 - 900 mOsm/kg  Basic metabolic panel     Status: Abnormal   Collection Time: 12/07/16  1:37 AM  Result Value Ref Range   Sodium 129 (L) 135 - 145 mmol/L   Potassium 6.1 (H) 3.5 - 5.1 mmol/L   Chloride 94 (L) 101 - 111 mmol/L   CO2 22 22 - 32 mmol/L   Glucose, Bld 529 (HH) 65 - 99 mg/dL    Comment: CRITICAL RESULT CALLED TO, READ BACK BY AND VERIFIED WITH: JONES K,RN 12/07/16 0249 WAYK    BUN 42 (H) 6 - 20 mg/dL   Creatinine, Ser 1.41 (H) 0.61 - 1.24 mg/dL   Calcium 9.5 8.9 - 10.3 mg/dL   GFR calc non Af Amer 58 (L) >60 mL/min   GFR calc Af Amer >60 >60 mL/min    Comment: (NOTE) The eGFR has been calculated using the CKD EPI equation. This calculation has not been validated in all clinical situations. eGFR's persistently <60 mL/min signify possible Chronic Kidney Disease.    Anion gap 13 5 - 15  CBC     Status: Abnormal   Collection Time: 12/07/16  1:37 AM  Result Value Ref Range   WBC 14.3 (H) 4.0 - 10.5 K/uL   RBC 5.87 (H) 4.22 - 5.81 MIL/uL   Hemoglobin 16.6 13.0 - 17.0 g/dL   HCT 49.2 39.0 - 52.0 %   MCV 83.8 78.0 - 100.0 fL   MCH 28.3 26.0 - 34.0 pg   MCHC 33.7 30.0 - 36.0 g/dL   RDW 15.6 (H) 11.5 - 15.5 %   Platelets 292 150 - 400 K/uL  Magnesium     Status: None   Collection Time: 12/07/16  1:37 AM  Result Value Ref Range   Magnesium 2.3 1.7 - 2.4 mg/dL  Phosphorus     Status: None   Collection Time: 12/07/16  1:37 AM  Result Value Ref Range   Phosphorus 3.7 2.5 - 4.6 mg/dL  Glucose, capillary     Status: Abnormal   Collection Time: 12/07/16  2:10 AM  Result  Value Ref Range   Glucose-Capillary 542 (HH) 65 - 99 mg/dL  Glucose, capillary     Status: Abnormal   Collection Time: 12/07/16  3:06 AM  Result Value Ref Range   Glucose-Capillary 449 (H) 65 - 99 mg/dL  Glucose, capillary     Status: Abnormal   Collection Time: 12/07/16  4:10 AM  Result Value Ref Range   Glucose-Capillary 367 (H) 65 - 99 mg/dL  Troponin I (q 6hr x 3)     Status: None   Collection Time: 12/07/16  4:59 AM  Result Value Ref Range   Troponin I <0.03 <0.03 ng/mL  Potassium     Status: Abnormal   Collection Time: 12/07/16  4:59 AM  Result Value Ref Range   Potassium 5.3 (H) 3.5 - 5.1 mmol/L  Hemoglobin A1c     Status: Abnormal   Collection Time: 12/07/16  4:59 AM  Result Value Ref Range   Hgb A1c MFr Bld 11.5 (H) 4.8 - 5.6 %    Comment: (NOTE) Pre diabetes:          5.7%-6.4% Diabetes:              >  6.4% Glycemic control for   <7.0% adults with diabetes    Mean Plasma Glucose 283.35 mg/dL  Glucose, capillary     Status: Abnormal   Collection Time: 12/07/16  5:08 AM  Result Value Ref Range   Glucose-Capillary 359 (H) 65 - 99 mg/dL  Glucose, capillary     Status: Abnormal   Collection Time: 12/07/16  6:00 AM  Result Value Ref Range   Glucose-Capillary 322 (H) 65 - 99 mg/dL  Digoxin level     Status: Abnormal   Collection Time: 12/07/16  6:32 AM  Result Value Ref Range   Digoxin Level 0.7 (L) 0.8 - 2.0 ng/mL  Glucose, capillary     Status: Abnormal   Collection Time: 12/07/16  7:05 AM  Result Value Ref Range   Glucose-Capillary 286 (H) 65 - 99 mg/dL    Ct Head Wo Contrast  Result Date: 12/07/2016 CLINICAL DATA:  Follow-up of subarachnoid hemorrhage. EXAM: CT HEAD WITHOUT CONTRAST TECHNIQUE: Contiguous axial images were obtained from the base of the skull through the vertex without intravenous contrast. COMPARISON:  Head CT 12/06/2016 FINDINGS: Brain: Left frontal hemorrhagic contusion has increased, with intraparenchymal hematoma component now measuring 2.4  x 1.6 x 1.6 cm (volume = 3.2 cm^3). The overlying left frontal polar subarachnoid hemorrhage is also slightly increased. Right frontal pole subarachnoid blood is minimally increased. Subarachnoid blood at the anterior left temporal lobe is more apparent than on the prior study. There is also trace subarachnoid blood at the anterior right temporal lobe. There is no midline shift or other mass effect. Small amount of blood at the superior aspect of the left sylvian fissure is unchanged. No hydrocephalus or intraventricular extension of blood. The area of hypoattenuation seen on the previous study in the anterior left temporal lobe is no longer present and was probably artifactual. Vascular: No hyperdense vessel or unexpected calcification. Skull: Normal. Negative for fracture or focal lesion. Sinuses/Orbits: Unchanged fluid level in the left maxillary sinus. Normal orbits. Other: None IMPRESSION: 1. Increased size of left frontal lobe hemorrhagic contusion and associated overlying subarachnoid blood. Mild surrounding edema but no significant mass effect. 2. The other areas of subarachnoid hemorrhage at the right frontal poles and both anterior temporal lobes are unchanged. 3. No hydrocephalus or intraventricular hemorrhage. Electronically Signed   By: Ulyses Jarred M.D.   On: 12/07/2016 04:06   Ct Head Wo Contrast  Result Date: 12/06/2016 CLINICAL DATA:  Syncope/fainting at [employer] today. Cardiac etiology suspected. EXAM: CT HEAD WITHOUT CONTRAST CT CERVICAL SPINE WITHOUT CONTRAST TECHNIQUE: Multidetector CT imaging of the head and cervical spine was performed following the standard protocol without intravenous contrast. Multiplanar CT image reconstructions of the cervical spine were also generated. COMPARISON:  None. FINDINGS: CT HEAD FINDINGS Brain: Bifrontal subarachnoid hemorrhage, small on the right and moderate on the left. Mild associated edema/contusion in the left frontal lobe. Small amount of  subarachnoid hemorrhage also in the left frontoparietal lobe more superiorly. Minimal blood in the circle Willis. Ill-defined low density in the left anterior temporal lobe, which may be secondary to artifact. No midline shift or hydrocephalus. Vascular: No hyperdense vessel. Skull: No fracture or focal lesion. Sinuses/Orbits: Fluid level in the left maxillary sinus with bubbly debris and high-density material. Possible all left inferior orbital fracture, age indeterminate. Other: None. CT CERVICAL SPINE FINDINGS Alignment: Straightening of normal lordosis. No traumatic subluxation. Skull base and vertebrae: No acute fracture. Skullbase is intact. Multilevel endplate changes, likely related to degenerative disc disease. Soft tissues  and spinal canal: No prevertebral fluid or swelling. No visible canal hematoma. Disc levels:  Diffuse disc space narrowing and endplate spurring. Upper chest: No acute abnormality. Other: None. IMPRESSION: 1. Bifrontal subarachnoid hemorrhage, traumatic pattern, mild to moderate amount. Probable associated edema/contusion in the left frontal lobe. Minimal blood tracks into the circle of Willis and the left frontal lobe more superiorly. 2. Vague low-density in the anterior left temporal lobe, possibly related to streak artifact/volume averaging. Consider MRI for further evaluation after coalescence of acute event. 3. Multilevel degenerative change in the cervical spine without acute fracture or subluxation. 4. Fluid level in the left maxillary sinus, with some high-density material. Possible fracture of the left inferior orbital wall of uncertain acuity. Consider face CT if there is clinical concern for acute facial injury. Critical Value/emergent results were called by telephone at the time of interpretation on 12/06/2016 at 7:47 pm to Dr. Delora Fuel , who verbally acknowledged these results. Electronically Signed   By: Jeb Levering M.D.   On: 12/06/2016 19:48   Ct Cervical Spine  Wo Contrast  Result Date: 12/06/2016 CLINICAL DATA:  Syncope/fainting at [employer] today. Cardiac etiology suspected. EXAM: CT HEAD WITHOUT CONTRAST CT CERVICAL SPINE WITHOUT CONTRAST TECHNIQUE: Multidetector CT imaging of the head and cervical spine was performed following the standard protocol without intravenous contrast. Multiplanar CT image reconstructions of the cervical spine were also generated. COMPARISON:  None. FINDINGS: CT HEAD FINDINGS Brain: Bifrontal subarachnoid hemorrhage, small on the right and moderate on the left. Mild associated edema/contusion in the left frontal lobe. Small amount of subarachnoid hemorrhage also in the left frontoparietal lobe more superiorly. Minimal blood in the circle Willis. Ill-defined low density in the left anterior temporal lobe, which may be secondary to artifact. No midline shift or hydrocephalus. Vascular: No hyperdense vessel. Skull: No fracture or focal lesion. Sinuses/Orbits: Fluid level in the left maxillary sinus with bubbly debris and high-density material. Possible all left inferior orbital fracture, age indeterminate. Other: None. CT CERVICAL SPINE FINDINGS Alignment: Straightening of normal lordosis. No traumatic subluxation. Skull base and vertebrae: No acute fracture. Skullbase is intact. Multilevel endplate changes, likely related to degenerative disc disease. Soft tissues and spinal canal: No prevertebral fluid or swelling. No visible canal hematoma. Disc levels:  Diffuse disc space narrowing and endplate spurring. Upper chest: No acute abnormality. Other: None. IMPRESSION: 1. Bifrontal subarachnoid hemorrhage, traumatic pattern, mild to moderate amount. Probable associated edema/contusion in the left frontal lobe. Minimal blood tracks into the circle of Willis and the left frontal lobe more superiorly. 2. Vague low-density in the anterior left temporal lobe, possibly related to streak artifact/volume averaging. Consider MRI for further evaluation  after coalescence of acute event. 3. Multilevel degenerative change in the cervical spine without acute fracture or subluxation. 4. Fluid level in the left maxillary sinus, with some high-density material. Possible fracture of the left inferior orbital wall of uncertain acuity. Consider face CT if there is clinical concern for acute facial injury. Critical Value/emergent results were called by telephone at the time of interpretation on 12/06/2016 at 7:47 pm to Dr. Delora Fuel , who verbally acknowledged these results. Electronically Signed   By: Jeb Levering M.D.   On: 12/06/2016 19:48    ROS: As above Blood pressure 107/79, pulse (!) 119, temperature 97.7 F (36.5 C), temperature source Oral, resp. rate (!) 23, height 6' (1.829 m), weight 81.6 kg (179 lb 14.3 oz), SpO2 98 %. Physical Exam  General: An alert and pleasant 46 year old black male  in no apparent distress  HEENT: Normocephalic, pupils equal round reactive light, there is no CSF otorrhea or rhinorrhea, extraocular muscles are intact  Neck: Supple without masses or deformities.  He has a normal range of motion.  There is no tenderness  Thorax: Symmetric  Abdomen: Soft  Extremities: Unremarkable  Back exam: Unremarkable  Neurologic exam: The patient is alert and oriented x3.  Glasgow Coma Scale 15.  Cranial nerves II through XII were examined bilaterally and grossly normal.  Vision and hearing are grossly normal bilaterally.  His motor strength is 5/5 in spinal hand grip, biceps, triceps, quadriceps, gastrocnemius, and dorsiflexors.  Cerebellar function was intact to rapid alternating movements of the upper extremities bilaterally.  Sensory function is intact to light touch sensation in all tested dermatomes bilaterally  I have reviewed the patient's head CT performed 11/21 and 12/07/16 performed at [facility].  He has inferior left frontal contusions which are a bit more prominent on the second scan.  There is no  significant mass-effect.  He has a scattered traumatic subarachnoid hemorrhage.  I have also reviewed the patient's head CT performed at [facility] on 12/06/2016.  I demonstrate some degenerative changes.  There is no fractures.  Assessment/Plan: Cerebral contusions, traumatic subarachnoid hemorrhage: The patient is doing well clinically.  His serial head CTs are fairly stable.  Cardiac issues, mural thrombus, Xarelto anticoagulation: Since the patient's cerebral contusions are a bit more prominent on today's scan, I would recommend not resuming his Xarelto until 12/09/2016.  Please call if I can be of further assistance.  Please have the patient follow-up with me as needed.  Ophelia Charter 12/07/2016, 7:29 AM

## 2016-12-07 NOTE — Progress Notes (Signed)
PT heart rate sustaining 120s, MD made aware. Order placed to check digoxin levels and possibly restart home meds.

## 2016-12-08 ENCOUNTER — Inpatient Hospital Stay (HOSPITAL_COMMUNITY): Payer: Medicaid Other

## 2016-12-08 DIAGNOSIS — R55 Syncope and collapse: Secondary | ICD-10-CM

## 2016-12-08 DIAGNOSIS — I361 Nonrheumatic tricuspid (valve) insufficiency: Secondary | ICD-10-CM

## 2016-12-08 LAB — ECHOCARDIOGRAM COMPLETE
Height: 72 in
Weight: 2906.54 oz

## 2016-12-08 LAB — COMPREHENSIVE METABOLIC PANEL WITH GFR
ALT: 22 U/L (ref 17–63)
AST: 34 U/L (ref 15–41)
Albumin: 2.8 g/dL — ABNORMAL LOW (ref 3.5–5.0)
Alkaline Phosphatase: 87 U/L (ref 38–126)
Anion gap: 12 (ref 5–15)
BUN: 38 mg/dL — ABNORMAL HIGH (ref 6–20)
CO2: 29 mmol/L (ref 22–32)
Calcium: 9.7 mg/dL (ref 8.9–10.3)
Chloride: 94 mmol/L — ABNORMAL LOW (ref 101–111)
Creatinine, Ser: 1.6 mg/dL — ABNORMAL HIGH (ref 0.61–1.24)
GFR calc Af Amer: 57 mL/min — ABNORMAL LOW
GFR calc non Af Amer: 49 mL/min — ABNORMAL LOW
Glucose, Bld: 113 mg/dL — ABNORMAL HIGH (ref 65–99)
Potassium: 4.2 mmol/L (ref 3.5–5.1)
Sodium: 135 mmol/L (ref 135–145)
Total Bilirubin: 1.1 mg/dL (ref 0.3–1.2)
Total Protein: 8 g/dL (ref 6.5–8.1)

## 2016-12-08 LAB — CBC WITH DIFFERENTIAL/PLATELET
Basophils Absolute: 0 K/uL (ref 0.0–0.1)
Basophils Relative: 0 %
Eosinophils Absolute: 0.1 K/uL (ref 0.0–0.7)
Eosinophils Relative: 0 %
HCT: 47.4 % (ref 39.0–52.0)
Hemoglobin: 15.9 g/dL (ref 13.0–17.0)
Lymphocytes Relative: 14 %
Lymphs Abs: 2.3 K/uL (ref 0.7–4.0)
MCH: 28.3 pg (ref 26.0–34.0)
MCHC: 33.5 g/dL (ref 30.0–36.0)
MCV: 84.3 fL (ref 78.0–100.0)
Monocytes Absolute: 2.2 K/uL — ABNORMAL HIGH (ref 0.1–1.0)
Monocytes Relative: 13 %
Neutro Abs: 11.9 K/uL — ABNORMAL HIGH (ref 1.7–7.7)
Neutrophils Relative %: 73 %
Platelets: 309 K/uL (ref 150–400)
RBC: 5.62 MIL/uL (ref 4.22–5.81)
RDW: 15.9 % — ABNORMAL HIGH (ref 11.5–15.5)
WBC: 16.5 K/uL — ABNORMAL HIGH (ref 4.0–10.5)

## 2016-12-08 LAB — GLUCOSE, CAPILLARY
Glucose-Capillary: 113 mg/dL — ABNORMAL HIGH (ref 65–99)
Glucose-Capillary: 150 mg/dL — ABNORMAL HIGH (ref 65–99)
Glucose-Capillary: 256 mg/dL — ABNORMAL HIGH (ref 65–99)
Glucose-Capillary: 319 mg/dL — ABNORMAL HIGH (ref 65–99)
Glucose-Capillary: 345 mg/dL — ABNORMAL HIGH (ref 65–99)

## 2016-12-08 LAB — VAS US CAROTID
LEFT ECA DIAS: -17 cm/s
LEFT VERTEBRAL DIAS: -17 cm/s
Left CCA dist dias: -22 cm/s
Left CCA dist sys: -80 cm/s
Left CCA prox dias: 22 cm/s
Left CCA prox sys: 84 cm/s
Left ICA dist dias: -28 cm/s
Left ICA dist sys: -68 cm/s
Left ICA prox dias: -22 cm/s
Left ICA prox sys: -51 cm/s
RIGHT ECA DIAS: -19 cm/s
RIGHT VERTEBRAL DIAS: -18 cm/s
Right CCA prox dias: 18 cm/s
Right CCA prox sys: 80 cm/s
Right cca dist sys: -64 cm/s

## 2016-12-08 MED ORDER — PERFLUTREN LIPID MICROSPHERE
INTRAVENOUS | Status: AC
  Administered 2016-12-08: 2 mL
  Filled 2016-12-08: qty 10

## 2016-12-08 NOTE — Progress Notes (Signed)
  Echocardiogram 2D Echocardiogram has been performed.  Benjamin Sherman 12/08/2016, 2:17 PM

## 2016-12-08 NOTE — Progress Notes (Signed)
PULMONARY / CRITICAL CARE MEDICINE   Name: Benjamin Sherman MRN: [id number] DOB: 03-Oct-1970    ADMISSION DATE:  12/06/2016 CONSULTATION DATE:  12/06/2016  REFERRING MD:  Dr. 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 Patient was shopping at [employer] [facility] and he had syncopal event then fell and hit the back of his head on tile floor. Patient woke up disoriented one minute later. CT Head with Bifrontal SAH. Sent to [facility] for further management.   Denies any weakness or numbness. No headache.    Subjective:  No complaints.   VITAL SIGNS: BP 121/82   Pulse (!) 105   Temp 98.1 F (36.7 C) (Oral)   Resp 17   Ht 6' (1.829 m)   Wt 82.4 kg (181 lb 10.5 oz)   SpO2 96%   BMI 24.64 kg/m   HEMODYNAMICS:    VENTILATOR SETTINGS:    INTAKE / OUTPUT: I/O last 3 completed shifts: In: 528.1 [P.O.:120; I.V.:108.1; Other:120; IV Piggyback:180] Out: 2250 [Urine:2250]  PHYSICAL EXAMINATION: General: middle aged male in NAD Neuro: Alert oriented x 3. PERRL. Moves all 4. HEENT: /AT, mild JVD PULM: diminished R base otherwise clear.  CV:  s1 s2 RRR  GI: soft, BS wnl, no r Extremities: edema min   LABS:  BMET Recent Labs  Lab 12/07/16 0137 12/07/16 0459 12/07/16 1608 12/08/16 0501  NA 129*  --  133* 135  K 6.1* 5.3* 5.3* 4.2  CL 94*  --  98* 94*  CO2 22  --  19* 29  BUN 42*  --  41* 38*  CREATININE 1.41*  --  1.28* 1.60*  GLUCOSE 529*  --  140* 113*    Electrolytes Recent Labs  Lab 12/07/16 0137 12/07/16 1608 12/08/16 0501  CALCIUM 9.5 9.6 9.7  MG 2.3 2.2  --   PHOS 3.7 4.2  --     CBC Recent Labs  Lab 12/06/16 1802 12/07/16 0137 12/08/16 0501  WBC 17.8* 14.3* 16.5*  HGB 15.4 16.6 15.9  HCT 47.3 49.2 47.4  PLT 310 292 309    Coag's Recent Labs   Lab 12/07/16 1512  APTT 29  INR 0.94    Sepsis Markers No results for input(s): LATICACIDVEN, PROCALCITON, O2SATVEN in the last 168 hours.  ABG No results for input(s): PHART, PCO2ART, PO2ART in the last 168 hours.  Liver Enzymes Recent Labs  Lab 12/06/16 1802 12/08/16 0501  AST 22 34  ALT 20 22  ALKPHOS 88 87  BILITOT 0.7 1.1  ALBUMIN 3.0* 2.8*    Cardiac Enzymes Recent Labs  Lab 12/06/16 1802 12/07/16 0024 12/07/16 0459  TROPONINI 0.04* 0.03* <0.03    Glucose Recent Labs  Lab 12/07/16 1854 12/07/16 1955 12/07/16 2056 12/08/16 0003 12/08/16 0418 12/08/16 0815  GLUCAP 169* 212* 213* 137* 113* 150*    Imaging Dg Chest Port 1 View  Result Date: 12/08/2016 CLINICAL DATA:  Syncopal episode. EXAM: PORTABLE CHEST 1 VIEW COMPARISON:  Chest x-ray 11/06/2016 FINDINGS: Heart size normal. Interval significant clearing of right mid lung field and right base infiltrate and right-sided pleural effusion . No pneumothorax. No acute bony abnormality . IMPRESSION: Interval significant clearing of right mid lung field and right base infiltrate and right-sided pleural effusion. Mild residual. Electronically  Signed   ByMaisie Fus  Register   On: 12/08/2016 07:13     STUDIES:  CT Head/C-spine 11/21 > 1. Bifrontal subarachnoid hemorrhage, traumatic pattern, mild to moderate amount. Probable associated edema/contusion in the left frontal lobe. Minimal blood tracks into the circle of Willis and the left frontal lobe more superiorly. 2. Vague low-density in the anterior left temporal lobe, possibly related to streak artifact/volume averaging. Consider MRI for further evaluation after coalescence of acute event. 3. Multilevel degenerative change in the cervical spine without acute fracture or subluxation. 4. Fluid level in the left maxillary sinus, with some high-density material. Possible fracture of the left inferior orbital wall of uncertain acuity. Consider face CT if there  is clinical concern for acute facial injury.  CT head 11/22 3 am>>>Increased size of left frontal lobe hemorrhagic contusion and associated overlying subarachnoid blood. Mild surrounding edema but no significant mass effect. 2. The other areas of subarachnoid hemorrhage at the right frontal poles and both anterior temporal lobes are unchanged. 3. No hydrocephalus or intraventricular hemorrhage  CULTURES: None.  ANTIBIOTICS: None.  SIGNIFICANT EVENTS: 11/21 > Presents to ED   LINES/TUBES: PIV   DISCUSSION: 46 year old male on Xarelto had syncopal episode and fell and hit his head. CT with bifrontal SAH  ASSESSMENT / PLAN:  PULMONARY A: Rt effusion, pulm edema? > improved P:   Maintain Oxygen Saturation > 92  Hold further diuresis Repeat pcxr in am   CARDIOVASCULAR A:  Syncopal Event likely related to hypotension  H/O Combined CHF with LV and RV dysfunction  EF10% LV Thrombus on Xarelto   P:  Cardiac Monitoring   Holding Xarelto, restart per neurosurg Holding home Hydralazine, Imdur, Aldactone, Demadex, Digoxin   Repeat echo  RENAL A:   Hyperkalemia  Hyponatremia  CKD Stage 3  Scr up 11/23 P:   Bmet to follow  GASTROINTESTINAL A:   No issues  P:   SLP cleared for reg foods/thin liq  HEMATOLOGIC A:   On Xarelto for LV Thrombus  SAH P:  Hold Anticoagulation  S/p KCentra  INFECTIOUS A:   No issues  P:   Trend WBC and Fever Curve  pcxr in am for left  ENDOCRINE A:   Hypothyroidism  DM    P:   Trend Glucose  SSI, consider res  NEUROLOGIC A:   Bifrontal SAH s/p Fall  P:   Neurosugery Following > CT this AM stable Frequent Neuro Exams  NSGY following  FAMILY  - Updates: patient updated bedside  - Inter-disciplinary family meet or Palliative Care meeting due by: 12/13/2016   Joneen Roach, AGACNP-BC [place] [facility] Pager [phone] or [phone]  12/08/2016 11:25 AM    STAFF NOTE: Cindi Carbon, MD FACP have personally reviewed patient's available data, including medical history, events of note, physical examination and test results as part of my evaluation. I have discussed with resident/NP and other care providers such as pharmacist, RN and RRT. In addition, I personally evaluated patient and elicited key findings GN:FAOZH, fc well, strong, feels less sob atrest, jvd wnl from elevated, CTA to bases now, edema lower, pcxr which I reviewed shows resolved sig rt base greater left int edema, CT head just done shows some edema now around bleed, does not appear to have increase bleed size, await echo repeat, was neg balance with lasix1.8 liters and he feels better breathing and pcxr resolved nearly, crt rise slight will hold lasix today, slp successful for diet, advance diet,  wbc demarg andf lasix related, not septic appearing, I updated pt in full. Repeat INR less 1, dr Wynetta Emeryram will decide future safety for anticoagulation given clot LV and need, consider agents reversible   Benjamin Rossettianiel J. Tyson AliasFeinstein, MD, FACP Pgr: [phone] [facility] 12/08/2016 11:58 AM

## 2016-12-08 NOTE — Progress Notes (Signed)
Report given to Angelica, RN. Nursing to continue to monitor.

## 2016-12-08 NOTE — Progress Notes (Signed)
Patient ID: Benjamin Sherman, male   DOB: 12/30/1970, 46 y.o.   MRN: [id number] Patient doing well this morning no headache no nausea eating and drinking well.  Pupils equal moves all extremities well no pronator drift  Repeat CT today if stable patient may be transferred to the floor.

## 2016-12-08 NOTE — Progress Notes (Signed)
Preliminary results by tech - Carotid Duplex Completed. No evidence of a significant stenosis noted. Vertebral arteries are patent with antegrade flow. Benjamin Sherman, BS, RDMS, RVT

## 2016-12-09 ENCOUNTER — Inpatient Hospital Stay (HOSPITAL_COMMUNITY): Payer: Medicaid Other

## 2016-12-09 LAB — CBC
HCT: 45.5 % (ref 39.0–52.0)
Hemoglobin: 15.4 g/dL (ref 13.0–17.0)
MCH: 28.6 pg (ref 26.0–34.0)
MCHC: 33.8 g/dL (ref 30.0–36.0)
MCV: 84.6 fL (ref 78.0–100.0)
Platelets: 298 10*3/uL (ref 150–400)
RBC: 5.38 MIL/uL (ref 4.22–5.81)
RDW: 16.3 % — ABNORMAL HIGH (ref 11.5–15.5)
WBC: 11.5 10*3/uL — ABNORMAL HIGH (ref 4.0–10.5)

## 2016-12-09 LAB — BASIC METABOLIC PANEL
Anion gap: 10 (ref 5–15)
BUN: 37 mg/dL — ABNORMAL HIGH (ref 6–20)
CO2: 27 mmol/L (ref 22–32)
Calcium: 9.4 mg/dL (ref 8.9–10.3)
Chloride: 98 mmol/L — ABNORMAL LOW (ref 101–111)
Creatinine, Ser: 1.1 mg/dL (ref 0.61–1.24)
GFR calc Af Amer: >60 mL/min (ref >60)
GFR calc non Af Amer: >60 mL/min (ref >60)
Glucose, Bld: 118 mg/dL — ABNORMAL HIGH (ref 65–99)
Potassium: 4 mmol/L (ref 3.5–5.1)
Sodium: 135 mmol/L (ref 135–145)

## 2016-12-09 LAB — MAGNESIUM: Magnesium: 2.3 mg/dL (ref 1.7–2.4)

## 2016-12-09 LAB — GLUCOSE, CAPILLARY
Glucose-Capillary: 130 mg/dL — ABNORMAL HIGH (ref 65–99)
Glucose-Capillary: 153 mg/dL — ABNORMAL HIGH (ref 65–99)
Glucose-Capillary: 155 mg/dL — ABNORMAL HIGH (ref 65–99)
Glucose-Capillary: 210 mg/dL — ABNORMAL HIGH (ref 65–99)
Glucose-Capillary: 273 mg/dL — ABNORMAL HIGH (ref 65–99)
Glucose-Capillary: 318 mg/dL — ABNORMAL HIGH (ref 65–99)

## 2016-12-09 LAB — PHOSPHORUS: Phosphorus: 4 mg/dL (ref 2.5–4.6)

## 2016-12-09 MED ORDER — DIGOXIN 125 MCG PO TABS
0.1250 mg | ORAL_TABLET | Freq: Every day | ORAL | Status: DC
  Administered 2016-12-09 – 2016-12-13 (×5): 0.125 mg via ORAL
  Filled 2016-12-09 (×5): qty 1

## 2016-12-09 MED ORDER — SPIRONOLACTONE 25 MG PO TABS
25.0000 mg | ORAL_TABLET | Freq: Every day | ORAL | Status: DC
  Administered 2016-12-09 – 2016-12-13 (×5): 25 mg via ORAL
  Filled 2016-12-09 (×5): qty 1

## 2016-12-09 MED ORDER — IVABRADINE HCL 5 MG PO TABS
2.5000 mg | ORAL_TABLET | Freq: Two times a day (BID) | ORAL | Status: DC
  Administered 2016-12-09 – 2016-12-13 (×8): 2.5 mg via ORAL
  Filled 2016-12-09 (×10): qty 1

## 2016-12-09 MED ORDER — INSULIN ASPART 100 UNIT/ML ~~LOC~~ SOLN: 0.0000 [IU] | Freq: Three times a day (TID) | SUBCUTANEOUS | Status: DC

## 2016-12-09 MED ORDER — INSULIN ASPART 100 UNIT/ML ~~LOC~~ SOLN
0.0000 [IU] | Freq: Every day | SUBCUTANEOUS | Status: DC
  Administered 2016-12-09: 4 [IU] via SUBCUTANEOUS

## 2016-12-09 MED ORDER — INSULIN ASPART 100 UNIT/ML ~~LOC~~ SOLN
0.0000 [IU] | Freq: Three times a day (TID) | SUBCUTANEOUS | Status: DC
  Administered 2016-12-10: 8 [IU] via SUBCUTANEOUS
  Administered 2016-12-10: 11 [IU] via SUBCUTANEOUS
  Administered 2016-12-10: 2 [IU] via SUBCUTANEOUS

## 2016-12-09 MED ORDER — ISOSORBIDE MONONITRATE ER 30 MG PO TB24
30.0000 mg | ORAL_TABLET | Freq: Every day | ORAL | Status: DC
  Administered 2016-12-09 – 2016-12-13 (×5): 30 mg via ORAL
  Filled 2016-12-09 (×5): qty 1

## 2016-12-09 MED ORDER — RIVAROXABAN 20 MG PO TABS: 20.0000 mg | ORAL_TABLET | Freq: Every day | ORAL | Status: DC

## 2016-12-09 MED ORDER — RIVAROXABAN 20 MG PO TABS
20.0000 mg | ORAL_TABLET | Freq: Every day | ORAL | Status: DC
  Administered 2016-12-10 – 2016-12-13 (×3): 20 mg via ORAL
  Filled 2016-12-09 (×3): qty 1

## 2016-12-09 NOTE — Progress Notes (Signed)
Patient ID: Benjamin Sherman, male   DOB: 1970/08/05, 46 y.o.   MRN: [id number] Patient looks very good today. He is awake and alert and interactive. He answers questions. He has no pronator drift. There was discussion between Dr. Lovell Sheehan and Dr. Wynetta Emery about resuming his oral anticoagulant 72 hours after injury. Obviously there is risk either way. 1 must weigh the risk of hemorrhagic complication from the anticoagulant versus the risk of stroke off of the anticoagulant. This is very difficult to quantify. Available literature would suggest that resuming his oral anticoagulant 72 hours after head injury would be relatively safe, but there is obviously still risk of hemorrhagic conversion and neurologic deterioration

## 2016-12-09 NOTE — Progress Notes (Signed)
[place] TEAM 1 - Stepdown/ICU TEAM  Arath Oharrow  YCX:[id number] DOB: Oct 02, 1970 DOA: 12/06/2016 PCP: Patient, No Pcp Per    Brief Narrative:  46 year old male with Hx of RV and LV dysfxn (EF 15%), LV thrombus on Xarelto, DM, CKD stage 3, and Hypothyroidism who presented to the ED at [facility] on 11/21 following a fall. Patient was shopping at [facility] when he had a syncopal event, falling and hitting the back of his head on tile floor. Patient woke up disoriented one minute later. CT Head with noted bifrontal SAH. He was sent to [facility] for further management.   Significant Events: 11/21 admit 11/23 B carotid duplex - no stenosis  11/23 TTE   Subjective: Tachycardic today, w/ marginal/low SBP.  Discussed need for anticoagulation versus risk of resuming anticoagulation with patient in detail.  He voiced no concerns and had no specific questions.  We have agreed to resume his anticoagulation tomorrow and to continue to monitor him in the hospital to assure his bleeding does not quickly resumed.  He otherwise denies chest pain shortness breath fevers chills nausea or vomiting.  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 - ?eosinophillic myocarditis  EF10% - will alert CHF Team to his admit   LV Thrombus on Xarelto   Plan to resume Xarelto 11/25 (>72 hours after injury) w/ monitoring as inpt due to singif risk of bleeding w/ same   Hyperkalemia  Resolved  Hyponatremia  Resolved  CKD Stage 3  Renal function is stable  Hypothyroidism  Continue home medical therapy  DM    CBG not at goal-adjust treatment and follow  DVT prophylaxis: SCDs Code Status: FULL CODE Family Communication: no family present at time  of exam  Disposition Plan: SDU   Consultants:  Neurosurgery   Antimicrobials:  none  Objective: Blood pressure (!) 86/69, pulse (!) 101, temperature 97.7 F (36.5 C), temperature source Oral, resp. rate (!) 23, height 6' (1.829 m), weight 83.1 kg (183 lb 3.2 oz), SpO2 99 %.  Intake/Output Summary (Last 24 hours) at 12/09/2016 1551 Last data filed at 12/09/2016 1400 Gross per 24 hour  Intake 480 ml  Output 1475 ml  Net -995 ml   Filed Weights   12/06/16 2309 12/08/16 0500 12/09/16 0409  Weight: 81.6 kg (179 lb 14.3 oz) 82.4 kg (181 lb 10.5 oz) 83.1 kg (183 lb 3.2 oz)    Examination: General: No acute respiratory distress Lungs: Clear to auscultation bilaterally without wheezes or crackles Cardiovascular: Regular rate and rhythm without murmur gallop or rub normal S1 and S2 Abdomen: Nontender, nondistended, soft, bowel sounds positive, no rebound, no ascites, no appreciable mass Extremities: No significant cyanosis, clubbing, or edema bilateral lower extremities  CBC: Recent Labs  Lab 12/06/16 1802 12/07/16 0137 12/08/16 0501 12/09/16 0411  WBC 17.8* 14.3* 16.5* 11.5*  NEUTROABS 14.5*  --  11.9*  --   HGB 15.4 16.6 15.9 15.4  HCT 47.3 49.2 47.4 45.5  MCV 86.2 83.8 84.3 84.6  PLT 310 292 309 298   Basic Metabolic Panel: Recent Labs  Lab 12/06/16 1802 12/07/16 0137 12/07/16 0459 12/07/16 1608 12/08/16 0501 12/09/16 0411  NA 130* 129*  --  133*  135 135  K 5.4* 6.1* 5.3* 5.3* 4.2 4.0  CL 92* 94*  --  98* 94* 98*  CO2 29 22  --  19* 29 27  GLUCOSE 429* 529*  --  140* 113* 118*  BUN 42* 42*  --  41* 38* 37*  CREATININE 1.48* 1.41*  --  1.28* 1.60* 1.10  CALCIUM 9.4 9.5  --  9.6 9.7 9.4  MG  --  2.3  --  2.2  --  2.3  PHOS  --  3.7  --  4.2  --  4.0   GFR: Estimated Creatinine Clearance: 90.1 mL/min (by C-G formula based on SCr of 1.1 mg/dL).  Liver Function Tests: Recent Labs  Lab 12/06/16 1802 12/08/16 0501  AST 22 34  ALT 20 22  ALKPHOS 88 87    BILITOT 0.7 1.1  PROT 7.5 8.0  ALBUMIN 3.0* 2.8*    Coagulation Profile: Recent Labs  Lab 12/07/16 1512  INR 0.94    Cardiac Enzymes: Recent Labs  Lab 12/06/16 1802 12/07/16 0024 12/07/16 0459  TROPONINI 0.04* 0.03* <0.03    HbA1C: Hgb A1c MFr Bld  Date/Time Value Ref Range Status  12/07/2016 04:59 AM 11.5 (H) 4.8 - 5.6 % Final    Comment:    (NOTE) Pre diabetes:          5.7%-6.4% Diabetes:              >6.4% Glycemic control for   <7.0% adults with diabetes   11/13/2016 08:11 AM 8.0 (H) 4.8 - 5.6 % Final    Comment:    (NOTE) Pre diabetes:          5.7%-6.4% Diabetes:              >6.4% Glycemic control for   <7.0% adults with diabetes     CBG: Recent Labs  Lab 12/09/16 0006 12/09/16 0351 12/09/16 0751 12/09/16 1257 12/09/16 1548  GLUCAP 153* 130* 155* 210* 273*    Recent Results (from the past 240 hour(s))  MRSA PCR Screening     Status: None   Collection Time: 12/06/16 11:10 PM  Result Value Ref Range Status   MRSA by PCR NEGATIVE NEGATIVE Final    Comment:        The GeneXpert MRSA Assay (FDA approved for NASAL specimens only), is one component of a comprehensive MRSA colonization surveillance program. It is not intended to diagnose MRSA infection nor to guide or monitor treatment for MRSA infections.      Scheduled Meds: . insulin aspart  0-15 Units Subcutaneous Q4H  . insulin glargine  20 Units Subcutaneous Q24H     LOS: 3 days   Lonia BloodJeffrey T. Cleo Villamizar, MD [facility] Office  [phone] Pager - Text Page per Loretha StaplerAmion as per below:  On-Call/Text Page:      [email or web address]      password [email or web address]  If 7PM-7AM, please contact night-coverage [email or web address] Password [email or web address] 12/09/2016, 3:51 PM

## 2016-12-10 ENCOUNTER — Inpatient Hospital Stay (HOSPITAL_COMMUNITY): Payer: Medicaid Other

## 2016-12-10 LAB — COMPREHENSIVE METABOLIC PANEL
ALT: 20 U/L (ref 17–63)
AST: 26 U/L (ref 15–41)
Albumin: 2.4 g/dL — ABNORMAL LOW (ref 3.5–5.0)
Alkaline Phosphatase: 84 U/L (ref 38–126)
Anion gap: 9 (ref 5–15)
BUN: 28 mg/dL — ABNORMAL HIGH (ref 6–20)
CO2: 25 mmol/L (ref 22–32)
Calcium: 9 mg/dL (ref 8.9–10.3)
Chloride: 97 mmol/L — ABNORMAL LOW (ref 101–111)
Creatinine, Ser: 1.05 mg/dL (ref 0.61–1.24)
GFR calc Af Amer: >60 mL/min (ref >60)
GFR calc non Af Amer: >60 mL/min (ref >60)
Glucose, Bld: 176 mg/dL — ABNORMAL HIGH (ref 65–99)
Potassium: 4.2 mmol/L (ref 3.5–5.1)
Sodium: 131 mmol/L — ABNORMAL LOW (ref 135–145)
Total Bilirubin: 0.7 mg/dL (ref 0.3–1.2)
Total Protein: 6.7 g/dL (ref 6.5–8.1)

## 2016-12-10 LAB — GLUCOSE, CAPILLARY
Glucose-Capillary: 137 mg/dL — ABNORMAL HIGH (ref 65–99)
Glucose-Capillary: 307 mg/dL — ABNORMAL HIGH (ref 65–99)
Glucose-Capillary: 251 mg/dL — ABNORMAL HIGH (ref 65–99)
Glucose-Capillary: 170 mg/dL — ABNORMAL HIGH (ref 65–99)

## 2016-12-10 LAB — CBC
HCT: 42.7 % (ref 39.0–52.0)
Hemoglobin: 14.4 g/dL (ref 13.0–17.0)
MCH: 28.5 pg (ref 26.0–34.0)
MCHC: 33.7 g/dL (ref 30.0–36.0)
MCV: 84.6 fL (ref 78.0–100.0)
Platelets: 263 10*3/uL (ref 150–400)
RBC: 5.05 MIL/uL (ref 4.22–5.81)
RDW: 16.1 % — ABNORMAL HIGH (ref 11.5–15.5)
WBC: 12.3 10*3/uL — ABNORMAL HIGH (ref 4.0–10.5)

## 2016-12-10 MED ORDER — INSULIN ASPART 100 UNIT/ML ~~LOC~~ SOLN
0.0000 [IU] | Freq: Every day | SUBCUTANEOUS | Status: DC
  Administered 2016-12-11: 4 [IU] via SUBCUTANEOUS
  Administered 2016-12-12: 3 [IU] via SUBCUTANEOUS

## 2016-12-10 MED ORDER — INSULIN ASPART 100 UNIT/ML ~~LOC~~ SOLN
0.0000 [IU] | Freq: Three times a day (TID) | SUBCUTANEOUS | Status: DC
  Administered 2016-12-11: 11 [IU] via SUBCUTANEOUS
  Administered 2016-12-11 – 2016-12-12 (×3): 7 [IU] via SUBCUTANEOUS
  Administered 2016-12-12: 4 [IU] via SUBCUTANEOUS
  Administered 2016-12-13 (×2): 7 [IU] via SUBCUTANEOUS

## 2016-12-10 MED ORDER — INSULIN ASPART 100 UNIT/ML ~~LOC~~ SOLN
3.0000 [IU] | Freq: Three times a day (TID) | SUBCUTANEOUS | Status: DC
  Administered 2016-12-11 (×2): 3 [IU] via SUBCUTANEOUS

## 2016-12-10 MED ORDER — INSULIN GLARGINE 100 UNIT/ML ~~LOC~~ SOLN
16.0000 [IU] | Freq: Every day | SUBCUTANEOUS | Status: DC
  Administered 2016-12-10: 16 [IU] via SUBCUTANEOUS
  Filled 2016-12-10 (×2): qty 0.16

## 2016-12-10 MED ORDER — INSULIN GLARGINE 100 UNIT/ML ~~LOC~~ SOLN
12.0000 [IU] | Freq: Every day | SUBCUTANEOUS | Status: DC
  Filled 2016-12-10: qty 0.12

## 2016-12-10 MED ORDER — TORSEMIDE 20 MG PO TABS
40.0000 mg | ORAL_TABLET | Freq: Every day | ORAL | Status: DC
  Administered 2016-12-10 – 2016-12-11 (×2): 40 mg via ORAL
  Filled 2016-12-10 (×3): qty 2

## 2016-12-10 NOTE — Progress Notes (Signed)
Patient ID: Benjamin Sherman, male   DOB: 04-14-1970, 46 y.o.   MRN: [id number] Subjective: Patient reports no headache or numbness tingling or weakness or visual changes  Objective: Vital signs in last 24 hours: Temp:  [97.5 F (36.4 C)-98.3 F (36.8 C)] 97.5 F (36.4 C) (11/25 0400) Pulse Rate:  [87-112] 92 (11/25 0400) Resp:  [15-23] 15 (11/25 0400) BP: (86-118)/(60-85) 106/75 (11/25 0400) SpO2:  [91 %-100 %] 96 % (11/25 0400) Weight:  [88.1 kg (194 lb 3.6 oz)] 88.1 kg (194 lb 3.6 oz) (11/25 0400)  Intake/Output from previous day: 11/24 0701 - 11/25 0700 In: 720 [P.O.:720] Out: 1025 [Urine:1025] Intake/Output this shift: No intake/output data recorded.  Neurologic: Grossly normal, he is awake and alert and pleasant and interactive and sitting up in bed feeding himself and watching [employer]  Lab Results: Lab Results  Component Value Date   WBC 12.3 (H) 12/10/2016   HGB 14.4 12/10/2016   HCT 42.7 12/10/2016   MCV 84.6 12/10/2016   PLT 263 12/10/2016   Lab Results  Component Value Date   INR 0.94 12/07/2016   BMET Lab Results  Component Value Date   NA 131 (L) 12/10/2016   K 4.2 12/10/2016   CL 97 (L) 12/10/2016   CO2 25 12/10/2016   GLUCOSE 176 (H) 12/10/2016   BUN 28 (H) 12/10/2016   CREATININE 1.05 12/10/2016   CALCIUM 9.0 12/10/2016    Studies/Results: Ct Head Wo Contrast  Result Date: 12/08/2016 CLINICAL DATA:  Bifrontal hemorrhagic contusion. Subdural hematoma follow-up. EXAM: CT HEAD WITHOUT CONTRAST TECHNIQUE: Contiguous axial images were obtained from the base of the skull through the vertex without intravenous contrast. COMPARISON:  CT head without contrast 12/07/2016 and 12/06/2016. FINDINGS: Brain: Left frontal hemorrhagic contusion is similar the prior study. Surrounding edema is slightly more prominent. Both parenchymal contusion and subarachnoid hemorrhage are again seen without significant interval change in the overall volume. There is likely a subdural  component as well. Hemorrhagic contusion along the medial inferior right frontal lobe is stable with slight increase in surrounding edema. Areas of hemorrhagic contusion involving the medial temporal lobes are again noted, left greater than right. Edema has increased without significant change in the hyperdense components. The basal ganglia are intact. Insular ribbon is normal bilaterally. The brainstem and cerebellum are normal. No other acute infarct is present. Vascular: No hyperdense vessel or unexpected calcification. Skull: The calvarium is intact. No acute or healing fractures are present. Sinuses/Orbits: A high-density fluid collection in the left maxillary sinus represents hemorrhage. Occult left maxillary sinus fracture or likely left orbital floor fracture is again suspected. The globes and orbits are otherwise within normal limits. IMPRESSION: 1. Increasing conspicuity of edema surrounding hemorrhagic contusion involving the inferior left frontal lobe without significant change in the hemorrhagic components of the parenchyma, subarachnoid space or subdural space. 2. Increasing edema surrounding smaller areas of parenchymal hemorrhagic contusion of the inferior medial right frontal lobe and left greater than right temporal lobes. 3. No new areas of hemorrhage or infarct. 4. High-density material in the left maxillary sinus compatible with blood and occult fracture. Electronically Signed   By: Marin Roberts M.D.   On: 12/08/2016 11:29   Dg Chest Port 1 View  Result Date: 12/10/2016 CLINICAL DATA:  Pneumothorax. EXAM: PORTABLE CHEST 1 VIEW COMPARISON:  12/09/2016 FINDINGS: The cardiac silhouette is normal in size. A small right-sided pneumothorax appears minimally larger than on the prior study. Mild right basilar opacity is unchanged. The left lung remains clear.  There may be a tiny right pleural effusion. IMPRESSION: 1. Minimally increased size of small right pneumothorax. 2. Unchanged right  basilar atelectasis/infiltrate. Electronically Signed   By: Sebastian AcheAllen  Grady M.D.   On: 12/10/2016 07:50   Dg Chest Port 1 View  Result Date: 12/09/2016 CLINICAL DATA:  Pulmonary edema.  Congestive heart failure. EXAM: PORTABLE CHEST 1 VIEW COMPARISON:  12/08/2016 FINDINGS: Heart size remains normal. Atelectasis or infiltrate in the right lower lobe shows no significant change. A tiny approximately 5% right pneumothorax is seen along the lateral chest wall. Left lung remains clear. IMPRESSION: Tiny less than 5% right pneumothorax. No significant change in right lower lobe atelectasis or infiltrate. Electronically Signed   By: Myles RosenthalJohn  Stahl M.D.   On: 12/09/2016 07:44    Assessment/Plan: Doing well. Continue current management   LOS: 4 days    JONES,DAVID S 12/10/2016, 8:36 AM

## 2016-12-10 NOTE — Progress Notes (Signed)
1900: Received handoff report from RN. Pt resting comfortably in room without complaint.  0000: Pt continues to rest comfortably. Denies headache.  0400: Pt continues to rest comfortably.  0700: Handoff report given to RN. No acute events overnight.

## 2016-12-10 NOTE — Discharge Instructions (Signed)
Information on my medicine - XARELTO® (Rivaroxaban) ° °This medication education was reviewed with me or my healthcare representative as part of my discharge preparation.   °Why was Xarelto® prescribed for you? °Xarelto® was prescribed for you to reduce the risk of blood clots forming. The medical term for these abnormal blood clots is venous thromboembolism (VTE). ° °What do you need to know about xarelto® ? °Take your Xarelto® ONCE DAILY at the same time every day. °You may take it either with or without food. ° °If you have difficulty swallowing the tablet whole, you may crush it and mix in applesauce just prior to taking your dose. ° °Take Xarelto® exactly as prescribed by your doctor and DO NOT stop taking Xarelto® without talking to the doctor who prescribed the medication.  Stopping without other VTE prevention medication to take the place of Xarelto® may increase your risk of developing a clot. ° °After discharge, you should have regular check-up appointments with your healthcare provider that is prescribing your Xarelto®.   ° °What do you do if you miss a dose? °If you miss a dose, take it as soon as you remember on the same day then continue your regularly scheduled once daily regimen the next day. Do not take two doses of Xarelto® on the same day.  ° °Important Safety Information °A possible side effect of Xarelto® is bleeding. You should call your healthcare provider right away if you experience any of the following: °? Bleeding from an injury or your nose that does not stop. °? Unusual colored urine (red or dark brown) or unusual colored stools (red or black). °? Unusual bruising for unknown reasons. °? A serious fall or if you hit your head (even if there is no bleeding). ° °Some medicines may interact with Xarelto® and might increase your risk of bleeding while on Xarelto®. To help avoid this, consult your healthcare provider or pharmacist prior to using any new prescription or non-prescription  medications, including herbals, vitamins, non-steroidal anti-inflammatory drugs (NSAIDs) and supplements. ° °This website has more information on Xarelto®: www.xarelto.com. ° ° °

## 2016-12-10 NOTE — Progress Notes (Signed)
TEAM 1 - Stepdown/ICU TEAM  Benjamin Sherman  ZOX:[id number] DOB: 08/29/1970 DOA: 12/06/2016 PCP: Patient, No Pcp Per    Brief Narrative:  46 year old male with Hx of RV and LV dysfxn (EF 15%), LV thrombus on Xarelto, DM, CKD stage 3, and Hypothyroidism who presented [facility] ED [facility] on 11/21 following a fall. [facility] he had a syncopal event, falling and hitting the back of his head on tile floor. Patient woke up disoriented one minute later. CT Head with noted bifrontal SAH. [facility].   Significant Events: 11/21 admit 11/23 B carotid duplex - no stenosis  11/23 TTE   Subjective: Pt reports a mild nagging HA today.  Denies focal neurologic complaints.  No cp, sob, n/v, or abdom pain.   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  EF10% - will alert CHF Team to his admit on Monday   LV Thrombus on Xarelto   resumed Xarelto today 11/25 (>72 hours after injury) w/ monitoring as inpt due to singif risk of bleeding w/ same   Hyperkalemia  Resolved  Hyponatremia  Recurring - resume diuretic and follow   CKD Stage 3  Renal function is stable  Hypothyroidism  Continue home medical therapy  DM    Remains poorly controlled - adjust tx again today and follow - A1c 11.5 w/ no home DM meds listed on med rec   DVT prophylaxis: SCDs Code Status: FULL CODE Family Communication: no family present at time of exam    Disposition Plan: SDU   Consultants:  Neurosurgery   Antimicrobials:  none  Objective: Blood pressure 100/72, pulse 96, temperature 98.1 F (36.7 C), temperature source Oral, resp. rate 20, height 6' (1.829 m), weight 88.1 kg (194 lb 3.6 oz), SpO2 98 %.  Intake/Output Summary (Last 24 hours) at 12/10/2016 1702 Last data filed at 12/10/2016 1300 Gross per 24 hour  Intake 1290 ml  Output 950 ml  Net 340 ml   Filed Weights   12/08/16 0500 12/09/16 0409 12/10/16 0400  Weight: 82.4 kg (181 lb 10.5 oz) 83.1 kg (183 lb 3.2 oz) 88.1 kg (194 lb 3.6 oz)    Examination: General: No acute respiratory distress - allert and pleasant  Lungs: CTA B - no wheeze  Cardiovascular: RRR Abdomen: NT/ND, soft, bs+ Extremities: trace edema B LE   CBC: Recent Labs  Lab 12/06/16 1802 12/07/16 0137 12/08/16 0501 12/09/16 0411 12/10/16 0551  WBC 17.8* 14.3* 16.5* 11.5* 12.3*  NEUTROABS 14.5*  --  11.9*  --   --   HGB 15.4 16.6 15.9 15.4 14.4  HCT 47.3 49.2 47.4 45.5 42.7  MCV 86.2 83.8 84.3 84.6 84.6  PLT 310 292 309 298 263   Basic Metabolic Panel: Recent Labs  Lab 12/07/16 0137 12/07/16 0459 12/07/16 1608 12/08/16 0501 12/09/16 0411 12/10/16 0551  NA 129*  --  133* 135 135 131*  K 6.1*  5.3* 5.3* 4.2 4.0 4.2  CL 94*  --  98* 94* 98* 97*  CO2 22  --  19* 29 27 25   GLUCOSE 529*  --  140* 113* 118* 176*  BUN 42*  --  41* 38* 37* 28*  CREATININE 1.41*  --  1.28* 1.60* 1.10 1.05  CALCIUM 9.5  --  9.6 9.7 9.4 9.0  MG 2.3  --  2.2  --  2.3  --   PHOS 3.7  --  4.2  --  4.0  --    GFR: Estimated Creatinine Clearance: 94.4 mL/min (by C-G formula based on SCr of 1.05 mg/dL).  Liver Function Tests: Recent Labs  Lab 12/06/16 1802 12/08/16 0501 12/10/16 0551  AST 22 34 26  ALT 20 22 20   ALKPHOS 88 87 84  BILITOT 0.7 1.1 0.7  PROT 7.5 8.0 6.7  ALBUMIN 3.0* 2.8* 2.4*    Coagulation Profile: Recent Labs  Lab 12/07/16 1512  INR 0.94    Cardiac Enzymes: Recent Labs  Lab  12/06/16 1802 12/07/16 0024 12/07/16 0459  TROPONINI 0.04* 0.03* <0.03    HbA1C: Hgb A1c MFr Bld  Date/Time Value Ref Range Status  12/07/2016 04:59 AM 11.5 (H) 4.8 - 5.6 % Final    Comment:    (NOTE) Pre diabetes:          5.7%-6.4% Diabetes:              >6.4% Glycemic control for   <7.0% adults with diabetes   11/13/2016 08:11 AM 8.0 (H) 4.8 - 5.6 % Final    Comment:    (NOTE) Pre diabetes:          5.7%-6.4% Diabetes:              >6.4% Glycemic control for   <7.0% adults with diabetes     CBG: Recent Labs  Lab 12/09/16 1548 12/09/16 2001 12/10/16 0827 12/10/16 1133 12/10/16 1659  GLUCAP 273* 318* 137* 307* 251*    Recent Results (from the past 240 hour(s))  MRSA PCR Screening     Status: None   Collection Time: 12/06/16 11:10 PM  Result Value Ref Range Status   MRSA by PCR NEGATIVE NEGATIVE Final    Comment:        The GeneXpert MRSA Assay (FDA approved for NASAL specimens only), is one component of a comprehensive MRSA colonization surveillance program. It is not intended to diagnose MRSA infection nor to guide or monitor treatment for MRSA infections.      Scheduled Meds: . digoxin  0.125 mg Oral Daily  . insulin aspart  0-15 Units Subcutaneous TID WC  . insulin aspart  0-5 Units Subcutaneous QHS  . isosorbide mononitrate  30 mg Oral Daily  . ivabradine  2.5 mg Oral BID WC  . rivaroxaban  20 mg Oral Q supper  . spironolactone  25 mg Oral Daily     LOS: 4 days   Lonia Blood, MD [facility] Office  [phone] Pager - Text Page per Amion as per below:  On-Call/Text Page:      [email or web address]      password [email or web address]  If 7PM-7AM, please contact night-coverage [email or web address] Password [email or web address] 12/10/2016, 5:02 PM

## 2016-12-11 ENCOUNTER — Telehealth (HOSPITAL_COMMUNITY): Payer: Self-pay | Admitting: Pharmacist

## 2016-12-11 ENCOUNTER — Inpatient Hospital Stay (HOSPITAL_COMMUNITY): Payer: Medicaid Other

## 2016-12-11 DIAGNOSIS — S270XXD Traumatic pneumothorax, subsequent encounter: Secondary | ICD-10-CM

## 2016-12-11 LAB — CBC
HCT: 44.6 % (ref 39.0–52.0)
Hemoglobin: 15 g/dL (ref 13.0–17.0)
MCH: 28.6 pg (ref 26.0–34.0)
MCHC: 33.6 g/dL (ref 30.0–36.0)
MCV: 85 fL (ref 78.0–100.0)
Platelets: 284 10*3/uL (ref 150–400)
RBC: 5.25 MIL/uL (ref 4.22–5.81)
RDW: 15.9 % — ABNORMAL HIGH (ref 11.5–15.5)
WBC: 12.7 10*3/uL — ABNORMAL HIGH (ref 4.0–10.5)

## 2016-12-11 LAB — GLUCOSE, CAPILLARY
Glucose-Capillary: 202 mg/dL — ABNORMAL HIGH (ref 65–99)
Glucose-Capillary: 293 mg/dL — ABNORMAL HIGH (ref 65–99)
Glucose-Capillary: 284 mg/dL — ABNORMAL HIGH (ref 65–99)
Glucose-Capillary: 326 mg/dL — ABNORMAL HIGH (ref 65–99)

## 2016-12-11 LAB — BASIC METABOLIC PANEL
Anion gap: 10 (ref 5–15)
BUN: 24 mg/dL — ABNORMAL HIGH (ref 6–20)
CO2: 29 mmol/L (ref 22–32)
Calcium: 9.4 mg/dL (ref 8.9–10.3)
Chloride: 95 mmol/L — ABNORMAL LOW (ref 101–111)
Creatinine, Ser: 1.19 mg/dL (ref 0.61–1.24)
GFR calc Af Amer: >60 mL/min (ref >60)
GFR calc non Af Amer: >60 mL/min (ref >60)
Glucose, Bld: 216 mg/dL — ABNORMAL HIGH (ref 65–99)
Potassium: 4.1 mmol/L (ref 3.5–5.1)
Sodium: 134 mmol/L — ABNORMAL LOW (ref 135–145)

## 2016-12-11 MED ORDER — INSULIN ASPART 100 UNIT/ML ~~LOC~~ SOLN
6.0000 [IU] | Freq: Three times a day (TID) | SUBCUTANEOUS | Status: DC
  Administered 2016-12-12 – 2016-12-13 (×5): 6 [IU] via SUBCUTANEOUS

## 2016-12-11 MED ORDER — INSULIN STARTER KIT- PEN NEEDLES (ENGLISH)
1.0000 | Freq: Once | Status: DC
Start: 2016-12-11 — End: 2016-12-13
  Filled 2016-12-11: qty 1

## 2016-12-11 MED ORDER — INSULIN GLARGINE 100 UNIT/ML ~~LOC~~ SOLN
20.0000 [IU] | Freq: Every day | SUBCUTANEOUS | Status: DC
  Administered 2016-12-11 – 2016-12-12 (×2): 20 [IU] via SUBCUTANEOUS
  Filled 2016-12-11 (×3): qty 0.2

## 2016-12-11 NOTE — Telephone Encounter (Signed)
[employer] patient assistance approved for Xarelto 20 mg daily through 11/30/17.   ID: [id number]  GRP: [id number] BIN: [id number]  Walda Hertzog K. Bonnye Fava, PharmD, BCPS, CPP Clinical Pharmacist Pager: [phone] Phone: [phone] 12/11/2016 1:02 PM

## 2016-12-11 NOTE — Progress Notes (Addendum)
0700 Received report from RN, patient is awake and wanting to get in the chair.   0730 Patient was transferred, one assist to chair, stated he is tired of being in the bed. Noticed that patient has really dry skin and is thirsty often. Was on a glucose stabilizer but was removed before coming to 4NP.   0800 Pt has an added insulin order of 3 units with meals along with the 0-20 units with meals that was previously ordered. In asking patient he denies being diabetic and states does not take insulin at home.  1200 Pt is growing frustrated with being "stuck" (insulin administered with meals) I educated to him why he is receiving insulin (elevated sugar levels). Very eager to be released to home.   Consult put in for case worker because of lack of health care coverage.

## 2016-12-11 NOTE — Progress Notes (Signed)
1900: Received handoff report from RN. Pt resting comfortably without complaint. Endorses headache 3/10 previously in the day that has since resolved. Responses are no longer delayed, and pt agrees that his verbal communication has improved greatly since last night.   0000:Pt continues to rest comfortably. Denies headache.  0400: Pt continues to rest comfortably.  0600: Nurse tech reports that pt refused bath.  0700: Handoff report given to RN. No acute events overnight.

## 2016-12-11 NOTE — Progress Notes (Signed)
Patient ID: Benjamin Sherman, male   DOB: Mar 13, 1970, 46 y.o.   MRN: [id number] Subjective: The patient is alert, pleasant and in no apparent distress.  He has no complaints.  He had a headache last night but this has resolved.  Objective: Vital signs in last 24 hours: Temp:  [97.6 F (36.4 C)-98.8 F (37.1 C)] 98.8 F (37.1 C) (11/26 0734) Pulse Rate:  [50-103] 103 (11/26 0922) Resp:  [13-25] 16 (11/26 0734) BP: (100-113)/(67-87) 105/67 (11/26 0734) SpO2:  [92 %-99 %] 97 % (11/26 0734) Weight:  [90.1 kg (198 lb 10.2 oz)] 90.1 kg (198 lb 10.2 oz) (11/26 0600)  Intake/Output from previous day: 11/25 0701 - 11/26 0700 In: 1050 [P.O.:1050] Out: 2250 [Urine:2250] Intake/Output this shift: Total I/O In: 240 [P.O.:240] Out: 575 [Urine:575]  Physical exam the patient is alert and oriented x3.  He is moving all 4 extremities well.  His speech is normal.  Lab Results: Recent Labs    12/10/16 0551 12/11/16 0845  WBC 12.3* 12.7*  HGB 14.4 15.0  HCT 42.7 44.6  PLT 263 284   BMET Recent Labs    12/10/16 0551 12/11/16 0845  NA 131* 134*  K 4.2 4.1  CL 97* 95*  CO2 25 29  GLUCOSE 176* 216*  BUN 28* 24*  CREATININE 1.05 1.19  CALCIUM 9.0 9.4    Studies/Results: Dg Chest Port 1 View  Result Date: 12/11/2016 CLINICAL DATA:  Pneumothorax EXAM: PORTABLE CHEST 1 VIEW COMPARISON:  12/10/2016 FINDINGS: Left pneumothorax again noted, not significantly changed since prior study. Small right effusion. Right lower lobe atelectasis or infiltrate. Left lung is clear. Heart is normal size. IMPRESSION: Stable small right pneumothorax. Right basilar atelectasis or infiltrate with small right effusion. Electronically Signed   By: Charlett Nose M.D.   On: 12/11/2016 09:15   Dg Chest Port 1 View  Result Date: 12/10/2016 CLINICAL DATA:  Pneumothorax. EXAM: PORTABLE CHEST 1 VIEW COMPARISON:  12/09/2016 FINDINGS: The cardiac silhouette is normal in size. A small right-sided pneumothorax appears  minimally larger than on the prior study. Mild right basilar opacity is unchanged. The left lung remains clear. There may be a tiny right pleural effusion. IMPRESSION: 1. Minimally increased size of small right pneumothorax. 2. Unchanged right basilar atelectasis/infiltrate. Electronically Signed   By: Sebastian Ache M.D.   On: 12/10/2016 07:50    Assessment/Plan: Cerebral contusion: The patient's Xarelto was restarted last night.  He is stable clinically.  From my point of view he can be discharged to follow-up with me as needed.  I will sign off.  Please call if I can be of further assistance.  LOS: 5 days     Cristi Loron 12/11/2016, 10:26 AM

## 2016-12-11 NOTE — Consult Note (Signed)
Advanced Heart Failure Team Consult Note   Primary Physician: Primary Cardiologist: Dr Gala RomneyBensimhon   Reason for Consultation: Heart Failure   HPI:    Benjamin Sherman is seen today for evaluation of heart failure at the request of Dr Dolphus JennyMcLung.   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   Followed in the HF clinic and was last seen 11/13. At that time he has not been off many medications because he could not afford them. Also he could not travel for INR checks. Xarelto was started. He was referred to [facility] to assist with meds.   [facility] episode that resulted in fall and hitting back of head. Says he was at [facility] and [facility] CT of head wit Bifrontal SAH. Xarelto was stopped. Neurosurgery consulted with recommendations to restart xarelto after 72 hours.  Repeat CT as noted below. Restarted xarelto 11/25.   Today he denies headache. Denies SOB.   CT Head 11/23 1. Increasing conspicuity of edema surrounding hemorrhagic contusion involving the inferior left frontal lobe without significant change in the hemorrhagic components of the parenchyma, subarachnoid space or subdural space. 2. Increasing edema surrounding smaller areas of parenchymal hemorrhagic contusion of the inferior medial right frontal lobe and left greater than right temporal lobes. 3. No new areas of hemorrhage or infarct. 4. High-density material in the left maxillary sinus compatible with blood and occult fracture.  12/08/2016 ECHO EF 20% Diffuse HK  Review of Systems: [y] = yes, [ ]  = no   General: Weight gain [ ] ; Weight loss [ ] ; Anorexia [ ] ; Fatigue [ ] ; Fever [ ] ; Chills [ ] ; Weakness [ ]   Cardiac: Chest pain/pressure [ ] ; Resting SOB [ ] ; Exertional SOB [ ] ; Orthopnea [ ] ; Pedal Edema [ ] ; Palpitations [ ] ; Syncope  [ ] ; Presyncope [ ] ; Paroxysmal nocturnal dyspnea[ ]   Pulmonary: Cough [ ] ; Wheezing[ ] ; Hemoptysis[ ] ; Sputum [ ] ; Snoring [ ]   GI: Vomiting[ ] ; Dysphagia[ ] ; Melena[ ] ; Hematochezia [ ] ; Heartburn[ ] ; Abdominal pain [ ] ; Constipation [ ] ; Diarrhea [ ] ; BRBPR [ ]   GU: Hematuria[ ] ; Dysuria [ ] ; Nocturia[ ]   Vascular: Pain in legs with walking [ ] ; Pain in feet with lying flat [ ] ; Non-healing sores [ ] ; Stroke [ ] ; TIA [ ] ; Slurred speech [ ] ;  Neuro: Headaches[ Y]; Vertigo[ ] ; Seizures[ ] ; Paresthesias[ ] ;Blurred vision [ ] ; Diplopia [ ] ; Vision changes [ ]   Ortho/Skin: Arthritis [ ] ; Joint pain [ ] ; Muscle pain [ ] ; Joint swelling [ ] ; Back Pain [ ] ; Rash [ ]   Psych: Depression[ ] ; Anxiety[ ]   Heme: Bleeding problems [ ] ; Clotting disorders [ ] ; Anemia [ ]   Endocrine: Diabetes [ Y]; Thyroid dysfunction[ Y]  Home Medications Prior to Admission medications   Medication Sig Start Date End Date Taking? Authorizing Provider  aspirin EC 81 MG EC tablet Take 1 tablet (81 mg total) by mouth daily. 11/17/16  Yes Graciella Freerillery, Michael Andrew, PA-C  digoxin (LANOXIN) 0.125 MG tablet Take 1 tablet (0.125 mg total) by mouth daily. 11/17/16  Yes Graciella Freerillery, Michael Andrew, PA-C  hydrALAZINE (APRESOLINE) 25 MG tablet Take 0.5 tablets (12.5 mg total) by mouth every 8 (eight) hours. 11/16/16  Yes Graciella Freerillery, Michael Andrew, PA-C  isosorbide mononitrate (IMDUR) 30 MG 24 hr tablet Take  1 tablet (30 mg total) by mouth daily. 11/17/16  Yes Graciella Freer, PA-C  ivabradine (CORLANOR) 5 MG TABS tablet Take 0.5 tablets (2.5 mg total) by mouth 2 (two) times daily with a meal. 11/16/16  Yes Tillery, Mariam Dollar, PA-C  potassium chloride SA (K-DUR,KLOR-CON) 20 MEQ tablet Take 2 tablets (40 mEq total) daily by mouth. 11/28/16  Yes Tillery, Mariam Dollar, PA-C  predniSONE (DELTASONE) 20 MG tablet Take 3 tablets (60 mg total) daily for 14 days by mouth. 11/28/16 12/12/16 Yes Tillery, Mariam Dollar, PA-C  rivaroxaban  (XARELTO) 20 MG TABS tablet Take 1 tablet (20 mg total) daily with supper by mouth. 11/29/16  Yes Tillery, Mariam Dollar, PA-C  sacubitril-valsartan (ENTRESTO) 24-26 MG Take 1 tablet 2 (two) times daily by mouth. 11/29/16  Yes Tillery, Mariam Dollar, PA-C  spironolactone (ALDACTONE) 25 MG tablet Take 1 tablet (25 mg total) by mouth daily. 11/17/16  Yes Graciella Freer, PA-C  torsemide (DEMADEX) 20 MG tablet Take 2 tablets (40 mg total) by mouth daily. 11/17/16  Yes Graciella Freer, PA-C    Past Medical History: Past Medical History:  Diagnosis Date  . CHF (congestive heart failure) (HCC)   . Diabetes mellitus without complication [facility])     Past Surgical History: Past Surgical History:  Procedure Laterality Date  . RIGHT/LEFT HEART CATH AND CORONARY ANGIOGRAPHY N/A 11/10/2016   Procedure: RIGHT/LEFT HEART CATH AND CORONARY ANGIOGRAPHY;  Surgeon: Dolores Patty, MD;  Location: [facility] INVASIVE CV LAB;  Service: Cardiovascular;  Laterality: N/A;    Family History: Family History  Problem Relation Age of Onset  . CAD Father     Social History: Social History   Socioeconomic History  . Marital status: Single    Spouse name: None  . Number of children: None  . Years of education: None  . Highest education level: None  Social Needs  . Financial resource strain: None  . Food insecurity - worry: None  . Food insecurity - inability: None  . Transportation needs - medical: None  . Transportation needs - non-medical: None  Occupational History  . Occupation: security  Tobacco Use  . Smoking status: Former Smoker    Types: Cigars    Last attempt to quit: 11/16/2016    Years since quitting: 0.0  . Smokeless tobacco: Never Used  . Tobacco comment: 6 cigars per wk  Substance and Sexual Activity  . Alcohol use: Yes    Alcohol/week: 8.4 oz    Types: 12 Cans of beer, 2 Shots of liquor per week    Comment: occ  . Drug use: No  . Sexual activity: None  Other Topics  Concern  . None  Social History Narrative  . None    Allergies:  Allergies  Allergen Reactions  . Bee Venom     Objective:    Vital Signs:   Temp:  [97.6 F (36.4 C)-98.8 F (37.1 C)] 98.8 F (37.1 C) (11/26 0734) Pulse Rate:  [50-103] 103 (11/26 0922) Resp:  [13-25] 16 (11/26 0734) BP: (100-113)/(67-87) 105/67 (11/26 0734) SpO2:  [92 %-99 %] 97 % (11/26 0734) Weight:  [198 lb 10.2 oz (90.1 kg)] 198 lb 10.2 oz (90.1 kg) (11/26 0600) Last BM Date: 12/09/16  Weight change: Filed Weights   12/09/16 0409 12/10/16 0400 12/11/16 0600  Weight: 183 lb 3.2 oz (83.1 kg) 194 lb 3.6 oz (88.1 kg) 198 lb 10.2 oz (90.1 kg)    Intake/Output:   Intake/Output Summary (Last 24 hours) at 12/11/2016  1610 Last data filed at 12/11/2016 0900 Gross per 24 hour  Intake 1290 ml  Output 2825 ml  Net -1535 ml      Physical Exam    General:  NAD. Sitting in the chair.  HEENT: normal Neck: supple. JVP 5-6  . Carotids 2+ bilat; no bruits. No lymphadenopathy or thyromegaly appreciated. Cor: PMI nondisplaced. Regular rate & rhythm. No rubs, or murmurs. + S3  Lungs: clear Abdomen: soft, nontender, nondistended. No hepatosplenomegaly. No bruits or masses. Good bowel sounds. Extremities: no cyanosis, clubbing, rash, edema Neuro: alert & orientedx3, cranial nerves grossly intact. moves all 4 extremities w/o difficulty. Affect pleasant   Telemetry   Sinus Tach 110s personally reviewed  EKG   Sinus Tach 110 bpm personally reviewed 12/06/2016 In the ED  Labs   Basic Metabolic Panel: Recent Labs  Lab 12/07/16 0137 12/07/16 0459 12/07/16 1608 12/08/16 0501 12/09/16 0411 12/10/16 0551  NA 129*  --  133* 135 135 131*  K 6.1* 5.3* 5.3* 4.2 4.0 4.2  CL 94*  --  98* 94* 98* 97*  CO2 22  --  19* 29 27 25   GLUCOSE 529*  --  140* 113* 118* 176*  BUN 42*  --  41* 38* 37* 28*  CREATININE 1.41*  --  1.28* 1.60* 1.10 1.05  CALCIUM 9.5  --  9.6 9.7 9.4 9.0  MG 2.3  --  2.2  --  2.3  --     PHOS 3.7  --  4.2  --  4.0  --     Liver Function Tests: Recent Labs  Lab 12/06/16 1802 12/08/16 0501 12/10/16 0551  AST 22 34 26  ALT 20 22 20   ALKPHOS 88 87 84  BILITOT 0.7 1.1 0.7  PROT 7.5 8.0 6.7  ALBUMIN 3.0* 2.8* 2.4*   No results for input(s): LIPASE, AMYLASE in the last 168 hours. No results for input(s): AMMONIA in the last 168 hours.  CBC: Recent Labs  Lab 12/06/16 1802 12/07/16 0137 12/08/16 0501 12/09/16 0411 12/10/16 0551 12/11/16 0845  WBC 17.8* 14.3* 16.5* 11.5* 12.3* 12.7*  NEUTROABS 14.5*  --  11.9*  --   --   --   HGB 15.4 16.6 15.9 15.4 14.4 15.0  HCT 47.3 49.2 47.4 45.5 42.7 44.6  MCV 86.2 83.8 84.3 84.6 84.6 85.0  PLT 310 292 309 298 263 284    Cardiac Enzymes: Recent Labs  Lab 12/06/16 1802 12/07/16 0024 12/07/16 0459  TROPONINI 0.04* 0.03* <0.03    BNP: BNP (last 3 results) Recent Labs    11/06/16 1621  BNP 961.6*    ProBNP (last 3 results) No results for input(s): PROBNP in the last 8760 hours.   CBG: Recent Labs  Lab 12/10/16 0827 12/10/16 1133 12/10/16 1659 12/10/16 2151 12/11/16 0749  GLUCAP 137* 307* 251* 170* 202*    Coagulation Studies: No results for input(s): LABPROT, INR in the last 72 hours.   Imaging   Dg Chest Port 1 View  Result Date: 12/11/2016 CLINICAL DATA:  Pneumothorax EXAM: PORTABLE CHEST 1 VIEW COMPARISON:  12/10/2016 FINDINGS: Left pneumothorax again noted, not significantly changed since prior study. Small right effusion. Right lower lobe atelectasis or infiltrate. Left lung is clear. Heart is normal size. IMPRESSION: Stable small right pneumothorax. Right basilar atelectasis or infiltrate with small right effusion. Electronically Signed   By: Charlett Nose M.D.   On: 12/11/2016 09:15      Medications:     Current Medications: . digoxin  0.125 mg Oral Daily  . insulin aspart  0-20 Units Subcutaneous TID WC  . insulin aspart  0-5 Units Subcutaneous QHS  . insulin aspart  3 Units  Subcutaneous TID WC  . insulin glargine  16 Units Subcutaneous QHS  . isosorbide mononitrate  30 mg Oral Daily  . ivabradine  2.5 mg Oral BID WC  . rivaroxaban  20 mg Oral Q supper  . spironolactone  25 mg Oral Daily  . 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   Admitted with syncope--> fall SAH  Assessment/Plan  1. SAH - Bifrontal  NSU following.  Anticoagulants restarted 12/10/2016 2. Syncope-  ? Hypotension ? Arrhythmia  -Given low EF he is a risk for VT.  -Will need Life Vest prior to discharge.  -Informed he cannot drive for 6 months.  3. Chronic Systolic Heart Failure NICM - ? 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 Had been on steroid taper.  -ECHO this admit EF 20%.  -Volume status stable. Hold torsemide.  -Continue ivabradine 2.5 mg twice a day.  -Continue digoxin 0.125 mg daily.  - Would hold off on entresto.  - No bb -Renal function stable.  4 LV Thrombus -On  xarelto . Restarted 11/25.  -Not on coumadin because he was unable to follow up for INR checks.  5. Hypothyroidism  6. DMII A1C 11.5. Needs PCP. On insulin  Medication concerns reviewed with patient and pharmacy team. Barriers identified: Yes. He has no insurance and is unable to pay for meds.  Concerned about health literacy.   Length of Stay: 5  Amy Clegg, NP  12/11/2016, 9:47 AM  Advanced Heart Failure Team Pager [phone] (M-F; 7a - 4p)  Please contact [facility] for night-coverage after hours (4p -7a ) and weekends on [email or web address]  Patient seen and examined with Tonye Becket, NP. We discussed all aspects of the encounter. I agree with the assessment and plan as stated above.   46 y/o with recent admission for acute systolic HF. 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 Had abrupt syncopal  episode at [facility] resulting in intracranial hemorrhage. Fortunately he is neuro intact.   HF well compensated but concern for VT causing syncope. Will continue current HF meds. Repeat echo.  Anticoagulation has been restarted. Follow on tele. Will need LifeVest prior to d/c. No driving x 6 months.   We will follow.   [name], MD  2:18 PM

## 2016-12-11 NOTE — Care Management Note (Addendum)
Case Management Note  Patient Details  Name: Benjamin Sherman MRN: [id number] Date of Birth: 1970-12-03  Subjective/Objective:    Pt admitted on 12/06/16 s/p fall with syncopal event.  He sustained bifrontal SAH.  PTA, pt independent, lives at home with roommate.                  Action/Plan: Met with pt to discuss discharge planning.  Pt has no PCP or insurance.  Pt interested in follow up at [facility] in [place]:  Eligibility screenings held each Monday between 10a-12p.  Requirements for clinic include income poverty level <200% of poverty level; will need to bring federal income tax return, months worth of bills to same address, months worth of pay stubs, bank statements, valid [place] driver's license, and documentation of any other forms of income to eligibility screening.  Once eligible, PCP appointment will be made.  Pt states he has transportation to physician's appointments with the assistance of friends.       Pt is eligible for medication assistance through [facility] program, which will allow pt to receive 34 day supply of meds for $3 each Rx.  Pt states he can afford $3 copay for meds.       Notified by HF team NP that pt will need Lifevest for home; called [name] with [facility], and he has already received referral from HF team.        Pt may benefit from [facility] for CHF follow up at discharge.  Please leave orders for [facility] and CSW if you agree.    Expected Discharge Date:                  Expected Discharge Plan:  [place]  In-House Referral:     Discharge planning Services  CM Consult, [facility], Medication Assistance, [facility]  Post Acute Care Choice:    Choice offered to:     DME Arranged:    DME Agency:     HH Arranged:    HH Agency:     Status of Service:  In process, will continue to follow  If discussed at Long Length of Stay Meetings, dates discussed:    Additional Comments:  12/11/16 [name], RN, BSN Will  continue to follow progress/follow up at discharge for assistance with medications and home Lifevest.    [name], RN 12/11/2016, 3:27 PM [phone]

## 2016-12-11 NOTE — Progress Notes (Signed)
Inpatient Diabetes Program Recommendations  AACE/ADA: New Consensus Statement on Inpatient Glycemic Control (2015)  Target Ranges:  Prepandial:   less than 140 mg/dL      Peak postprandial:   less than 180 mg/dL (1-2 hours)      Critically ill patients:  140 - 180 mg/dL   Lab Results  Component Value Date   GLUCAP 293 (H) 12/11/2016   HGBA1C 11.5 (H) 12/07/2016    Review of Glycemic Control Results for Benjamin Sherman, Benjamin Sherman (MRN [id number]) as of 12/11/2016 14:12  Ref. Range 12/10/2016 11:33 12/10/2016 16:59 12/10/2016 21:51 12/11/2016 07:49 12/11/2016 12:00  Glucose-Capillary Latest Ref Range: 65 - 99 mg/dL 307 (H) 251 (H) 170 (H) 202 (H) 293 (H)   Diabetes history: DM2 Outpatient Diabetes medications: None Current orders for Inpatient glycemic control: Lantus 16 units + Novolog 3 units tid meal coverage + Novolog correction resistant tid + hs  Inpatient Diabetes Program Recommendations:   -Increase Lantus to 20 units qd -Increase Novolog meal coverage to 6 units tid if eats 50% Spoke with patient in October when first diagnosed with DM2. Patient states he has been drinking tea with regular sugar and reviewed need to change to sugar free drinks or water. Spoke with RN Kassie Mends and plans to begin insulin teaching with patient.Insulin starter kit ordered. May D/C on Novolin 70/30 insulin mix. Noted patient to be referred to [facility]. Reviewed with patient that his hgb A1c has increased from 8.0 on 09/13/16 to current 11.5 (average blood glucose 283).  Thank you, Nani Gasser. Alvah Gilder, RN, MSN, CDE  Diabetes Coordinator Inpatient Glycemic Control Team Team Pager [phone] (8am-5pm) 12/11/2016 2:21 PM

## 2016-12-11 NOTE — Progress Notes (Signed)
[facility] TEAM 1 - Stepdown/ICU TEAM  Benjamin Sherman  OZY:[id number] DOB: 1970-07-04 DOA: 12/06/2016 PCP: Patient, No Pcp Per    Brief Narrative:  46 year old male with Hx of RV and LV dysfxn (EF 15%), LV thrombus on Xarelto, DM, CKD stage 3, and Hypothyroidism who presented to the ED at [facility] on 11/21 following a fall. Patient was shopping at [facility] when he had a syncopal event, falling and hitting the back of his head on tile floor. Patient woke up disoriented one minute later. CT Head with noted bifrontal SAH. He was sent to [facility] for further management.   Significant Events: 11/21 admit 11/23 B carotid duplex - no stenosis  11/23 TTE   Subjective: Anticoagulation was resumed last night.  CHF team has been alerted to his admission for follow-up.  Patient has been up getting around for most of the day.  He has no complaints at this time.  Assessment & Plan:  Bifrontal SAH s/p Fall  NS has now signed off - monitoring as inpt one additional night with resumption of anticoagulation  Small R Pneumothorax <5% - f/u CXR today notes minimal increase in size of PTX - follow in serial fashion - clinically insignificant at this time but need to see it resolving - stable on chest x-ray today - recheck in a.m.  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 - ?eosinophillic myocarditis  CW88% - CHF team following - home meds being resumed stepwise fashion  LV Thrombus on Xarelto   resumed Xarelto 11/25 (>72 hours after injury) w/ monitoring as inpt due to singif risk of bleeding w/ same   Hyperkalemia  Resolved  Hyponatremia  Improved with resumption of diuretic  CKD Stage 3  Renal function is  stable  Hypothyroidism  Continue home medical therapy  DM    CBG improved but not yet at goal - A1c 11.5 w/ no home DM meds listed on med rec   DVT prophylaxis: SCDs Code Status: FULL CODE Family Communication: no family present at time of exam  Disposition Plan: SDU - possible discharge in next 24-48 hours  Consultants:  Neurosurgery  CHF Team  Antimicrobials:  none  Objective: Blood pressure 90/78, pulse (!) 103, temperature 98.1 F (36.7 C), temperature source Oral, resp. rate 16, height 6' (1.829 m), weight 90.1 kg (198 lb 10.2 oz), SpO2 96 %.  Intake/Output Summary (Last 24 hours) at 12/11/2016 1613 Last data filed at 12/11/2016 0900 Gross per 24 hour  Intake 240 ml  Output 2375 ml  Net -2135 ml   Filed Weights   12/09/16 0409 12/10/16 0400 12/11/16 0600  Weight: 83.1 kg (183 lb 3.2 oz) 88.1 kg (194 lb 3.6 oz) 90.1 kg (198 lb 10.2 oz)    Examination: General: No acute respiratory distress Lungs: CTA B without wheezing or crackles Cardiovascular: RRR Abdomen: NT/ND, soft, bs+ - no rebound Extremities: trace edema B LE without significant change  CBC: Recent Labs  Lab 12/06/16 1802 12/07/16 0137 12/08/16 0501 12/09/16 0411 12/10/16 0551 12/11/16 0845  WBC 17.8* 14.3* 16.5* 11.5* 12.3* 12.7*  NEUTROABS 14.5*  --  11.9*  --   --   --   HGB 15.4 16.6 15.9 15.4 14.4 15.0  HCT 47.3 49.2 47.4 45.5 42.7 44.6  MCV 86.2 83.8 84.3 84.6 84.6 85.0  PLT 310 292 309 298 263 520   Basic Metabolic Panel: Recent Labs  Lab 12/07/16 0137  12/07/16 1608 12/08/16 0501 12/09/16 0411 12/10/16 0551 12/11/16 0845  NA 129*  --  133* 135 135 131* 134*  K 6.1*   < > 5.3* 4.2 4.0 4.2 4.1  CL 94*  --  98* 94* 98* 97* 95*  CO2 22  --  19* '29 27 25 29  ' GLUCOSE 529*  --  140* 113* 118* 176* 216*  BUN 42*  --  41* 38* 37* 28* 24*  CREATININE 1.41*  --  1.28* 1.60* 1.10 1.05 1.19  CALCIUM 9.5  --  9.6 9.7 9.4 9.0 9.4  MG 2.3  --  2.2  --  2.3  --   --   PHOS 3.7  --  4.2   --  4.0  --   --    < > = values in this interval not displayed.   GFR: Estimated Creatinine Clearance: 83.3 mL/min (by C-G formula based on SCr of 1.19 mg/dL).  Liver Function Tests: Recent Labs  Lab 12/06/16 1802 12/08/16 0501 12/10/16 0551  AST 22 34 26  ALT '20 22 20  ' ALKPHOS 88 87 84  BILITOT 0.7 1.1 0.7  PROT 7.5 8.0 6.7  ALBUMIN 3.0* 2.8* 2.4*    Coagulation Profile: Recent Labs  Lab 12/07/16 1512  INR 0.94    Cardiac Enzymes: Recent Labs  Lab 12/06/16 1802 12/07/16 0024 12/07/16 0459  TROPONINI 0.04* 0.03* <0.03    HbA1C: Hgb A1c MFr Bld  Date/Time Value Ref Range Status  12/07/2016 04:59 AM 11.5 (H) 4.8 - 5.6 % Final    Comment:    (NOTE) Pre diabetes:          5.7%-6.4% Diabetes:              >6.4% Glycemic control for   <7.0% adults with diabetes   11/13/2016 08:11 AM 8.0 (H) 4.8 - 5.6 % Final    Comment:    (NOTE) Pre diabetes:          5.7%-6.4% Diabetes:              >6.4% Glycemic control for   <7.0% adults with diabetes     CBG: Recent Labs  Lab 12/10/16 1133 12/10/16 1659 12/10/16 2151 12/11/16 0749 12/11/16 1200  GLUCAP 307* 251* 170* 202* 293*    Recent Results (from the past 240 hour(s))  MRSA PCR Screening     Status: None   Collection Time: 12/06/16 11:10 PM  Result Value Ref Range Status   MRSA by PCR NEGATIVE NEGATIVE Final    Comment:        The GeneXpert MRSA Assay (FDA approved for NASAL specimens only), is one component of a comprehensive MRSA colonization surveillance program. It is not intended to diagnose MRSA infection nor to guide or monitor treatment for MRSA infections.      Scheduled Meds: . digoxin  0.125 mg Oral Daily  . insulin aspart  0-20 Units Subcutaneous TID WC  . insulin aspart  0-5 Units Subcutaneous QHS  . insulin aspart  3 Units Subcutaneous TID WC  . insulin glargine  16 Units Subcutaneous QHS  . insulin starter kit- pen needles  1 kit Other Once  . isosorbide mononitrate  30  mg Oral Daily  . ivabradine  2.5 mg Oral BID WC  .  rivaroxaban  20 mg Oral Q supper  . spironolactone  25 mg Oral Daily     LOS: 5 days   Cherene Altes, MD [facility] Office  [phone] Pager - Text Page per Amion as per below:  On-Call/Text Page:      [email or web address]      password [email or web address]  If 7PM-7AM, please contact night-coverage [email or web address] Password [facility] 12/11/2016, 4:13 PM

## 2016-12-12 ENCOUNTER — Telehealth: Payer: Self-pay | Admitting: Licensed Clinical Social Worker

## 2016-12-12 DIAGNOSIS — I9589 Other hypotension: Secondary | ICD-10-CM

## 2016-12-12 LAB — BASIC METABOLIC PANEL
Anion gap: 11 (ref 5–15)
BUN: 28 mg/dL — ABNORMAL HIGH (ref 6–20)
CO2: 28 mmol/L (ref 22–32)
Calcium: 9.3 mg/dL (ref 8.9–10.3)
Chloride: 94 mmol/L — ABNORMAL LOW (ref 101–111)
Creatinine, Ser: 1.33 mg/dL — ABNORMAL HIGH (ref 0.61–1.24)
GFR calc Af Amer: >60 mL/min (ref >60)
GFR calc non Af Amer: >60 mL/min (ref >60)
Glucose, Bld: 185 mg/dL — ABNORMAL HIGH (ref 65–99)
Potassium: 3.8 mmol/L (ref 3.5–5.1)
Sodium: 133 mmol/L — ABNORMAL LOW (ref 135–145)

## 2016-12-12 LAB — GLUCOSE, CAPILLARY
Glucose-Capillary: 177 mg/dL — ABNORMAL HIGH (ref 65–99)
Glucose-Capillary: 229 mg/dL — ABNORMAL HIGH (ref 65–99)
Glucose-Capillary: 232 mg/dL — ABNORMAL HIGH (ref 65–99)
Glucose-Capillary: 269 mg/dL — ABNORMAL HIGH (ref 65–99)

## 2016-12-12 MED ORDER — LOSARTAN POTASSIUM 50 MG PO TABS
25.0000 mg | ORAL_TABLET | Freq: Every day | ORAL | Status: DC
  Administered 2016-12-12: 25 mg via ORAL
  Filled 2016-12-12: qty 1

## 2016-12-12 NOTE — Progress Notes (Signed)
PROGRESS NOTE    Jonatha Gagen  TKW:[id number] DOB: April 12, 1970 DOA: 12/06/2016 PCP: Patient, No Pcp Per   Brief Narrative:  46 year BM PMHx RV/LV dysfxn (EF 15%), LV thrombus on Xarelto, Diabetes Type 2 uncontrolled with Renal Complication, CKD stage 3, Hypothyroidism   Presented to the ED at [facility] on 11/21 following a fall. Patient was shopping at [facility] when he had a syncopal event, falling and hitting the back of his head on tile floor. Patient woke up disoriented one minute later. CT Head with noted bifrontal SAH. He was sent to [facility] for further management.     Subjective: 11/27 A/O 4, negative CP, negative SOB, negative abdominal pain, negative N/V. States never knew that he had diabetes nor heart problems.   Assessment & Plan:   Active Problems:   Traumatic subarachnoid hematoma with loss of consciousness (HCC)   Traumatic subarachnoid hemorrhage with loss of consciousness of 30 minutes or less (HCC)   Anticoagulated   Syncope   Hypotension   Hyperkalemia   Bifrontal SAH s/p Fall  NS has now signed off - monitoring as inpt one additional night with resumption of anticoagulation   Small R Pneumothorax <5% - f/u CXR today notes minimal increase in size of PTX - follow in serial fashion - clinically insignificant at this time but need to see it resolving - stable on chest x-ray today - recheck in a.m.   Syncope and collapse -CHF team arranging for LifeVest prior to discharge    Chronic hypotension Baseline SBP appears to be ~90 per review of old records - appears stable at this time   Chronic systolic CHF -Echocardiogram 11/23 EF 20%  -Management per CHF team   Left Ventricle Thrombus  -Xarelto resumed on 11/25 monitor closely for sequela of bleeding given his recent [facility].   Hyperkalemia  Resolved   Hyponatremia  Improved with resumption of diuretic   CKD Stage 3  Renal function is stable   Hypothyroidism  Continue home medical therapy     Diabetes Type 2 uncontrolled with Complication -92/42 Hemoglobin A1c = 11.5 -Patient has been seen by diabetic coordinator -Consult placed to diabetic nutritionist         DVT prophylaxis: SCD  Code Status: Full Family Communication: None Disposition Plan: TBD   Consultants:  Neurosurgery  CHF team  Procedures/Significant Events:  11/23 Echocardiogram:Left ventricle: The cavity size was mildly dilated. Wall   thickness was normal. The estimated ejection fraction was 20%.   Diffuse hypokinesis. Left ventricular diastolic function   parameters were normal. - Atrial septum: No defect or patent foramen ovale was identified.    I have personally reviewed and interpreted all radiology studies and my findings are as above.  VENTILATOR SETTINGS:    Cultures   Antimicrobials:    Devices    LINES / TUBES:      Continuous Infusions:   Objective: Vitals:   12/11/16 1947 12/11/16 2300 12/12/16 0435 12/12/16 0500  BP: 105/70 100/81 104/78   Pulse:      Resp: _0 Temp: 97.9 F (36.6 C) 97.9 F (36.6 C) 97.8 F (36.6 C)   TempSrc: Oral Oral Oral   SpO2:   98%   Weight:    196 lb 13.9 oz (89.3 kg)  Height:        Intake/Output Summary (Last 24 hours) at 12/12/2016 0839 Last data filed at 12/12/2016 0437 Gross per 24 hour  Intake 720 ml  Output 1325 ml  Net -605 ml   Filed Weights   12/10/16 0400 12/11/16 0600 12/12/16 0500  Weight: 194 lb 3.6 oz (88.1 kg) 198 lb 10.2 oz (90.1 kg) 196 lb 13.9 oz (89.3 kg)    Examination:  General: A/O 4, No acute respiratory distress Neck:  Negative scars, masses, torticollis, lymphadenopathy, JVD Lungs: Clear to auscultation bilaterally without wheezes or crackles Cardiovascular: Regular rate and rhythm without murmur gallop or rub normal S1 and S2 Abdomen: negative abdominal pain, nondistended, positive soft, bowel sounds, no rebound, no ascites, no appreciable mass Extremities: No significant cyanosis,  clubbing, or edema bilateral lower extremities Skin: Negative rashes, lesions, ulcers Psychiatric:  Negative depression, negative anxiety, negative fatigue, negative mania  Central nervous system:  Cranial nerves II through XII intact, tongue/uvula midline, all extremities muscle strength 5/5, sensation intact throughout, finger nose finger bilateral within normal limits, quick finger touch bilateral within normal limits,  negative dysarthria, negative expressive aphasia, negative receptive aphasia.  .     Data Reviewed: Care during the described time interval was provided by me .  I have reviewed this patient's available data, including medical history, events of note, physical examination, and all test results as part of my evaluation.   CBC: Recent Labs  Lab 12/06/16 1802 12/07/16 0137 12/08/16 0501 12/09/16 0411 12/10/16 0551 12/11/16 0845  WBC 17.8* 14.3* 16.5* 11.5* 12.3* 12.7*  NEUTROABS 14.5*  --  11.9*  --   --   --   HGB 15.4 16.6 15.9 15.4 14.4 15.0  HCT 47.3 49.2 47.4 45.5 42.7 44.6  MCV 86.2 83.8 84.3 84.6 84.6 85.0  PLT 310 292 309 298 263 859   Basic Metabolic Panel: Recent Labs  Lab 12/07/16 0137  12/07/16 1608 12/08/16 0501 12/09/16 0411 12/10/16 0551 12/11/16 0845 12/12/16 0427  NA 129*  --  133* 135 135 131* 134* 133*  K 6.1*   < > 5.3* 4.2 4.0 4.2 4.1 3.8  CL 94*  --  98* 94* 98* 97* 95* 94*  CO2 22  --  19* _0 GLUCOSE 529*  --  140* 113* 118* 176* 216* 185*  BUN 42*  --  41* 38* 37* 28* 24* 28*  CREATININE 1.41*  --  1.28* 1.60* 1.10 1.05 1.19 1.33*  CALCIUM 9.5  --  9.6 9.7 9.4 9.0 9.4 9.3  MG 2.3  --  2.2  --  2.3  --   --   --   PHOS 3.7  --  4.2  --  4.0  --   --   --    < > = values in this interval not displayed.   GFR: Estimated Creatinine Clearance: 74.6 mL/min (A) (by C-G formula based on SCr of 1.33 mg/dL (H)). Liver Function Tests: Recent Labs  Lab 12/06/16 1802 12/08/16 0501 12/10/16 0551  AST 22 34 26  ALT _1 ALKPHOS 88 87 84  BILITOT 0.7 1.1 0.7  PROT 7.5 8.0 6.7  ALBUMIN 3.0* 2.8* 2.4*   No results for input(s): LIPASE, AMYLASE in the last 168 hours. No results for input(s): AMMONIA in the last 168 hours. Coagulation Profile: Recent Labs  Lab 12/07/16 1512  INR 0.94   Cardiac Enzymes: Recent Labs  Lab 12/06/16 1802 12/07/16 0024 12/07/16 0459  TROPONINI 0.04* 0.03* <0.03   BNP (last 3 results) No results for input(s): PROBNP in the last 8760 hours. HbA1C: No results for input(s): HGBA1C in the last 72 hours. CBG: Recent  Labs  Lab 12/11/16 0749 12/11/16 1200 12/11/16 1619 12/11/16 2207 12/12/16 0744  GLUCAP 202* 293* 284* 326* 177*   Lipid Profile: No results for input(s): CHOL, HDL, LDLCALC, TRIG, CHOLHDL, LDLDIRECT in the last 72 hours. Thyroid Function Tests: No results for input(s): TSH, T4TOTAL, FREET4, T3FREE, THYROIDAB in the last 72 hours. Anemia Panel: No results for input(s): VITAMINB12, FOLATE, FERRITIN, TIBC, IRON, RETICCTPCT in the last 72 hours. Urine analysis: No results found for: COLORURINE, APPEARANCEUR, [place], [place], [place], [place], [place], [place], [place], [place], NITRITE, LEUKOCYTESUR Sepsis Labs: _0 (procalcitonin:4,lacticidven:4)  ) Recent Results (from the past 240 hour(s))  MRSA PCR Screening     Status: None   Collection Time: 12/06/16 11:10 PM  Result Value Ref Range Status   MRSA by PCR NEGATIVE NEGATIVE Final    Comment:        The GeneXpert MRSA Assay (FDA approved for NASAL specimens only), is one component of a comprehensive MRSA colonization surveillance program. It is not intended to diagnose MRSA infection nor to guide or monitor treatment for MRSA infections.          Radiology Studies: Dg Chest Port 1 View  Result Date: 12/11/2016 CLINICAL DATA:  46 y/o  M; follow-up of pneumothorax. EXAM: PORTABLE CHEST 1 VIEW COMPARISON:  12/11/2016 chest radiograph FINDINGS: Stable normal  cardiac silhouette. Diminution of right pneumothorax with trace residual. Stable right basilar opacity and small effusion. Bones are unremarkable. IMPRESSION: Diminution in right pneumothorax with trace residual. Stable right basilar opacity and small effusion. Electronically Signed   By: Kristine Garbe M.D.   On: 12/11/2016 21:15   Dg Chest Port 1 View  Result Date: 12/11/2016 CLINICAL DATA:  Pneumothorax EXAM: PORTABLE CHEST 1 VIEW COMPARISON:  12/10/2016 FINDINGS: Left pneumothorax again noted, not significantly changed since prior study. Small right effusion. Right lower lobe atelectasis or infiltrate. Left lung is clear. Heart is normal size. IMPRESSION: Stable small right pneumothorax. Right basilar atelectasis or infiltrate with small right effusion. Electronically Signed   By: Rolm Baptise M.D.   On: 12/11/2016 09:15        Scheduled Meds: . digoxin  0.125 mg Oral Daily  . insulin aspart  0-20 Units Subcutaneous TID WC  . insulin aspart  0-5 Units Subcutaneous QHS  . insulin aspart  6 Units Subcutaneous TID WC  . insulin glargine  20 Units Subcutaneous QHS  . insulin starter kit- pen needles  1 kit Other Once  . isosorbide mononitrate  30 mg Oral Daily  . ivabradine  2.5 mg Oral BID WC  . rivaroxaban  20 mg Oral Q supper  . spironolactone  25 mg Oral Daily   Continuous Infusions:   LOS: 6 days    Time spent: 40 minutes    WOODS, Geraldo Docker, MD [facility] Pager [phone]   If 7PM-7AM, please contact night-coverage [email or web address] Password [facility] 12/12/2016, 8:39 AM

## 2016-12-12 NOTE — Progress Notes (Signed)
Gave patient diabetic education as he is a new onset, (11/13/16) while administering his lunch time coverage. Starter kit was ordered by diabetic coordinator and was available for learning. Used the glucometer to teach and have patient teach back how to clean with alcohol, charge the needle, where to prick (sides of finger not the pad), to wipe first blood and check sugar. Patient showed me that he understood and knew how to check his sugar. Went over s/s of hypo and hyperglycemia, diet changes, and the functions of the pancreas, as well as health history and how uncontrolled diabetes affects other organ functions.

## 2016-12-12 NOTE — Progress Notes (Signed)
Went over diabetic information again for dinner meal time coverage. Spoke with diabetic coordinator between meals (lunch and dinner) and she recommended having patient try to inject the insulin himself.  Patient refused to participate in the administration of the insulin. Numbers have increased during the deal with meal coverage so we went over diet once again (cutting out carbs, increasing fresh foods, and limiting sugar drinks).

## 2016-12-12 NOTE — Progress Notes (Deleted)
Pt given starter kit as he is a new diabetic patient. Educated on diet, coverage, signs and symptoms of hypo and hyper glycemia and how the disease affects other organs (he has CHF and CKD as well). Used the glucometer to teach patient and had patient teach back to me that he understood. Did well checking his blood sugar, had a few questions. I will relay to next shift to educate more with bedtime coverage.

## 2016-12-12 NOTE — Telephone Encounter (Signed)
CSW referred to assist with transportation. CSW attempted to reach patient with no answer and no ability to leave message. CSW will arrange for taxi ride on next clinic visit if able to reach to inform. Lasandra Beech, LCSW, CCSW-MCS [phone]

## 2016-12-12 NOTE — Progress Notes (Signed)
Advanced Heart Failure Rounding Note  Primary Cardiologist: Dr. Haroldine Laws   Subjective:    Feeling OK this am. Sleepy, but did not sleep well. Thinks he will be able to get friends to give him rides, being unable to drive for now. Denies SOB.   Weight 196 lbs (Was 207 at office visit 11/28/16)  CT Head 11/23 1. Increasing conspicuity of edema surrounding hemorrhagic contusion involving the inferior left frontal lobe without significant change in the hemorrhagic components of the parenchyma, subarachnoid space or subdural space. 2. Increasing edema surrounding smaller areas of parenchymal hemorrhagic contusion of the inferior medial right frontal lobe and left greater than right temporal lobes. 3. No new areas of hemorrhage or infarct. 4. High-density material in the left maxillary sinus compatible with blood and occult fracture.  12/08/2016 ECHO EF 20% Diffuse HK (reviewed personally)  Objective:   Weight Range: 196 lb 13.9 oz (89.3 kg) Body mass index is 26.7 kg/m.   Vital Signs:   Temp:  [97.8 F (36.6 C)-98.1 F (36.7 C)] 97.8 F (36.6 C) (11/27 0435) Pulse Rate:  [38-103] 58 (11/26 1500) Resp:  [10-28] 18 (11/27 0435) BP: (90-105)/(70-81) 104/78 (11/27 0435) SpO2:  [56 %-98 %] 98 % (11/27 0435) Weight:  [196 lb 13.9 oz (89.3 kg)] 196 lb 13.9 oz (89.3 kg) (11/27 0500) Last BM Date: 12/09/16  Weight change: Filed Weights   12/10/16 0400 12/11/16 0600 12/12/16 0500  Weight: 194 lb 3.6 oz (88.1 kg) 198 lb 10.2 oz (90.1 kg) 196 lb 13.9 oz (89.3 kg)   Intake/Output:   Intake/Output Summary (Last 24 hours) at 12/12/2016 0812 Last data filed at 12/12/2016 0437 Gross per 24 hour  Intake 720 ml  Output 1325 ml  Net -605 ml    Physical Exam    General:  NAD, lying in bed.  HEENT: Normal Neck: Supple. JVP 6-7. Carotids 2+ bilat; no bruits. No lymphadenopathy or thyromegaly appreciated. Cor: PMI nondisplaced. Regular rate & rhythm. No rubs, gallops or  murmurs. +S3 Lungs: Clear Abdomen: Soft, nontender, nondistended. No hepatosplenomegaly. No bruits or masses. Good bowel sounds. Extremities: No cyanosis, clubbing, rash, or edema Neuro: Alert & orientedx3, cranial nerves grossly intact. moves all 4 extremities w/o difficulty. Affect pleasant  Telemetry   Sinus tach 100-110s Rare PVCs. , Personally reviewed.    EKG    Sinus tach on admit  Labs    CBC Recent Labs    12/10/16 0551 12/11/16 0845  WBC 12.3* 12.7*  HGB 14.4 15.0  HCT 42.7 44.6  MCV 84.6 85.0  PLT 263 850   Basic Metabolic Panel Recent Labs    12/11/16 0845 12/12/16 0427  NA 134* 133*  K 4.1 3.8  CL 95* 94*  CO2 29 28  GLUCOSE 216* 185*  BUN 24* 28*  CREATININE 1.19 1.33*  CALCIUM 9.4 9.3   Liver Function Tests Recent Labs    12/10/16 0551  AST 26  ALT 20  ALKPHOS 84  BILITOT 0.7  PROT 6.7  ALBUMIN 2.4*   No results for input(s): LIPASE, AMYLASE in the last 72 hours. Cardiac Enzymes No results for input(s): CKTOTAL, CKMB, CKMBINDEX, TROPONINI in the last 72 hours.  BNP: BNP (last 3 results) Recent Labs    11/06/16 1621  BNP 961.6*   ProBNP (last 3 results) No results for input(s): PROBNP in the last 8760 hours.  D-Dimer No results for input(s): DDIMER in the last 72 hours. Hemoglobin A1C No results for input(s): HGBA1C in the last  72 hours. Fasting Lipid Panel No results for input(s): CHOL, HDL, LDLCALC, TRIG, CHOLHDL, LDLDIRECT in the last 72 hours. Thyroid Function Tests No results for input(s): TSH, T4TOTAL, T3FREE, THYROIDAB in the last 72 hours.  Invalid input(s): FREET3  Other results:  Imaging   Dg Chest Port 1 View  Result Date: 12/11/2016 CLINICAL DATA:  46 y/o  M; follow-up of pneumothorax. EXAM: PORTABLE CHEST 1 VIEW COMPARISON:  12/11/2016 chest radiograph FINDINGS: Stable normal cardiac silhouette. Diminution of right pneumothorax with trace residual. Stable right basilar opacity and small effusion. Bones are  unremarkable. IMPRESSION: Diminution in right pneumothorax with trace residual. Stable right basilar opacity and small effusion. Electronically Signed   By: Kristine Garbe M.D.   On: 12/11/2016 21:15   Dg Chest Port 1 View  Result Date: 12/11/2016 CLINICAL DATA:  Pneumothorax EXAM: PORTABLE CHEST 1 VIEW COMPARISON:  12/10/2016 FINDINGS: Left pneumothorax again noted, not significantly changed since prior study. Small right effusion. Right lower lobe atelectasis or infiltrate. Left lung is clear. Heart is normal size. IMPRESSION: Stable small right pneumothorax. Right basilar atelectasis or infiltrate with small right effusion. Electronically Signed   By: Rolm Baptise M.D.   On: 12/11/2016 09:15     Medications:    Scheduled Medications: . digoxin  0.125 mg Oral Daily  . insulin aspart  0-20 Units Subcutaneous TID WC  . insulin aspart  0-5 Units Subcutaneous QHS  . insulin aspart  6 Units Subcutaneous TID WC  . insulin glargine  20 Units Subcutaneous QHS  . insulin starter kit- pen needles  1 kit Other Once  . isosorbide mononitrate  30 mg Oral Daily  . ivabradine  2.5 mg Oral BID WC  . rivaroxaban  20 mg Oral Q supper  . 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   Admitted with syncope with fall --> SAH   Assessment/Plan   1. SAH - Bifrontal  - NSU has declared him stable for discharge and outpatient follow up.  - Anticoagulants restarted 12/10/2016. He is on Xarelto. (Pt assistance for this med confirmed 12/11/16) 2. Syncope-  ? Hypotension ? Arrhythmia  -Given low EF he is at risk for VT.  -Will need Life Vest prior to discharge. Rep alerted. Will confirm paperwork has been submitted.  -Informed he cannot drive for 6 months. (05/2017) 3. Chronic  Systolic Heart Failure NICM - ? 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 Had been on steroid taper.  - ECHO 12/08/2016 LVEF 20%.  - Volume status stable on exam. Holding torsemide. May need to hold for the time being as outpatient,  - Continue ivabradine 2.5 mg twice a day.  - Continue digoxin 0.125 mg daily.  - Continue spiro 12.5 - No BB at this time.  - Pressures too soft for adding Entresto back on at this time.  -Renal function stable.  4 LV Thrombus -On xarelto . Restarted 11/25.  -Not on coumadin because he was unable to follow up for INR checks.  5. Hypothyroidism  - Needs PCP.  6. DMII - Hgb A1C 11.5 12/07/2016 - Needs PCP. On insulin.  7. AKI  - Creatinine 1.05 -> 1.19 -> 1.33 - Continue to watch volume status closely. Torsemide is on hold.   HF meds remains on hold. Stable  from a HF standpoint.  Will need Lifevest prior to d/c. Will discuss with rep today.   Medication concerns reviewed with patient and pharmacy team. Barriers identified: Yes. Pt has no insurance and is unable to pay for his meds. This has been covered thus far by the HF fund.  Concerned about his health literacy. He has very poor insight into his condition.   Transportation may also be an issue. Message sent to HFSW. We can provide taxi transport to the HF clinic, and may be able to help patient get set up with [facility].   Length of Stay: [address]  Annamaria Helling  12/12/2016, 8:12 AM  Advanced Heart Failure Team Pager [phone] (M-F; 7a - 4p)  Please contact [facility] for night-coverage after hours (4p -7a ) and weekends on [email or web address]   Patient seen and examined with the above-signed Advanced Practice Provider and/or Housestaff. I personally reviewed laboratory data, imaging studies and relevant notes. I independently examined the patient and formulated the important aspects of the plan. I have edited the note to reflect any of my changes or salient points. I have personally discussed the plan with the  patient and/or family.  Stable from neuro standpoint. Volume status looks good. Rare PVCs on tele. Echo reviewed personally EF 20%  Add losartan today. Possibly home tomorrow if we can get LifeVest. Strongly suspect syncope was arrhythmogenic. No driving x 6 months.   Glori Bickers, MD  9:20 AM

## 2016-12-13 ENCOUNTER — Encounter (HOSPITAL_COMMUNITY): Payer: Self-pay

## 2016-12-13 DIAGNOSIS — R55 Syncope and collapse: Secondary | ICD-10-CM

## 2016-12-13 DIAGNOSIS — E118 Type 2 diabetes mellitus with unspecified complications: Secondary | ICD-10-CM

## 2016-12-13 DIAGNOSIS — I5022 Chronic systolic (congestive) heart failure: Secondary | ICD-10-CM

## 2016-12-13 DIAGNOSIS — I240 Acute coronary thrombosis not resulting in myocardial infarction: Secondary | ICD-10-CM

## 2016-12-13 DIAGNOSIS — IMO0002 Reserved for concepts with insufficient information to code with codable children: Secondary | ICD-10-CM

## 2016-12-13 DIAGNOSIS — E1165 Type 2 diabetes mellitus with hyperglycemia: Secondary | ICD-10-CM

## 2016-12-13 DIAGNOSIS — N183 Chronic kidney disease, stage 3 unspecified: Secondary | ICD-10-CM

## 2016-12-13 DIAGNOSIS — I609 Nontraumatic subarachnoid hemorrhage, unspecified: Secondary | ICD-10-CM

## 2016-12-13 DIAGNOSIS — J939 Pneumothorax, unspecified: Secondary | ICD-10-CM

## 2016-12-13 LAB — GLUCOSE, CAPILLARY
Glucose-Capillary: 225 mg/dL — ABNORMAL HIGH (ref 65–99)
Glucose-Capillary: 246 mg/dL — ABNORMAL HIGH (ref 65–99)
Glucose-Capillary: 213 mg/dL — ABNORMAL HIGH (ref 65–99)

## 2016-12-13 LAB — BASIC METABOLIC PANEL
Anion gap: 10 (ref 5–15)
BUN: 23 mg/dL — ABNORMAL HIGH (ref 6–20)
CO2: 28 mmol/L (ref 22–32)
Calcium: 9.3 mg/dL (ref 8.9–10.3)
Chloride: 94 mmol/L — ABNORMAL LOW (ref 101–111)
Creatinine, Ser: 1.15 mg/dL (ref 0.61–1.24)
GFR calc Af Amer: >60 mL/min (ref >60)
GFR calc non Af Amer: >60 mL/min (ref >60)
Glucose, Bld: 148 mg/dL — ABNORMAL HIGH (ref 65–99)
Potassium: 4.1 mmol/L (ref 3.5–5.1)
Sodium: 132 mmol/L — ABNORMAL LOW (ref 135–145)

## 2016-12-13 LAB — CBC WITH DIFFERENTIAL/PLATELET
Basophils Absolute: 0 10*3/uL (ref 0.0–0.1)
Basophils Relative: 0 %
Eosinophils Absolute: 0.1 10*3/uL (ref 0.0–0.7)
Eosinophils Relative: 1 %
HCT: 40.3 % (ref 39.0–52.0)
Hemoglobin: 13.6 g/dL (ref 13.0–17.0)
Lymphocytes Relative: 17 %
Lymphs Abs: 2.2 10*3/uL (ref 0.7–4.0)
MCH: 28.5 pg (ref 26.0–34.0)
MCHC: 33.7 g/dL (ref 30.0–36.0)
MCV: 84.3 fL (ref 78.0–100.0)
Monocytes Absolute: 1 10*3/uL (ref 0.1–1.0)
Monocytes Relative: 8 %
Neutro Abs: 10 10*3/uL — ABNORMAL HIGH (ref 1.7–7.7)
Neutrophils Relative %: 74 %
Platelets: 240 10*3/uL (ref 150–400)
RBC: 4.78 MIL/uL (ref 4.22–5.81)
RDW: 15.7 % — ABNORMAL HIGH (ref 11.5–15.5)
WBC: 13.3 10*3/uL — ABNORMAL HIGH (ref 4.0–10.5)

## 2016-12-13 LAB — MAGNESIUM: Magnesium: 2.1 mg/dL (ref 1.7–2.4)

## 2016-12-13 MED ORDER — "PEN NEEDLES 5/16"" 31G X 8 MM MISC": 0 refills | Status: DC

## 2016-12-13 MED ORDER — TORSEMIDE 20 MG PO TABS: 20.0000 mg | ORAL_TABLET | Freq: Every day | ORAL | 3 refills | Status: DC

## 2016-12-13 MED ORDER — INSULIN ASPART 100 UNIT/ML FLEXPEN: 3.0000 [IU] | PEN_INJECTOR | Freq: Three times a day (TID) | SUBCUTANEOUS | 11 refills | Status: DC

## 2016-12-13 MED ORDER — INSULIN GLARGINE 100 UNITS/ML SOLOSTAR PEN: 20.0000 [IU] | PEN_INJECTOR | Freq: Every day | SUBCUTANEOUS | 11 refills | Status: DC

## 2016-12-13 MED ORDER — LOSARTAN POTASSIUM 25 MG PO TABS: 25.0000 mg | ORAL_TABLET | Freq: Every day | ORAL | 3 refills | Status: DC

## 2016-12-13 MED ORDER — POTASSIUM CHLORIDE CRYS ER 20 MEQ PO TBCR: 20.0000 meq | EXTENDED_RELEASE_TABLET | Freq: Every day | ORAL | 3 refills | Status: DC

## 2016-12-13 MED FILL — LOSARTAN POTASSIUM 25 MG TA: 25 | 30 days supply | Qty: 30 | Fill #0

## 2016-12-13 NOTE — Clinical Social Work Note (Signed)
CSW received consult for assistance with transportation home. Visit made with patient regarding transport and he reported that he has someone he can call to come get him and take him home. Nurse contacted and updated. CSW will continue to follow through discharge.  Genelle Bal, MSW, LCSW Licensed Clinical Social Worker Clinical Social Work Department [employer] [phone]

## 2016-12-13 NOTE — Progress Notes (Signed)
Notified that pt needs assistance with transportation home.  Left voicemail and text message for CSW Erie Noe to assist with transportation needs.   Quintella Baton, RN, BSN  Trauma/Neuro ICU Case Manager [phone]

## 2016-12-13 NOTE — Plan of Care (Signed)
  RD consulted for nutrition education regarding diabetes.   Lab Results  Component Value Date   HGBA1C 11.5 (H) 12/07/2016    RD provided "Carbohydrate Counting for People with Diabetes" handout from the Academy of [employer] and Dietetics. Discussed different food groups and their effects on blood sugar, emphasizing carbohydrate-containing foods. Provided list of carbohydrates and recommended serving sizes of common foods.  Discussed importance of controlled and consistent carbohydrate intake throughout the day. Provided examples of ways to balance meals/snacks and encouraged intake of high-fiber, whole grain complex carbohydrates. Discussed diabetic friendly drink options.Teach back method used.  Expect good compliance.  Body mass index is 26.76 kg/m. Pt meets criteria for overweight based on current BMI.  Current diet order is heart healthy/carbohydrate modified, patient is consuming approximately 100% of meals at this time. Labs and medications reviewed. No further nutrition interventions warranted at this time. RD contact information provided. If additional nutrition issues arise, please re-consult RD.  Roslyn Smiling, MS, RD, LDN Pager # [phone] After hours/ weekend pager # [phone]

## 2016-12-13 NOTE — Discharge Summary (Addendum)
Physician Discharge Summary  Benjamin HorsemanSteven Sherman BJY:[id number] DOB: 04/13/1970 DOA: 12/06/2016  PCP: Patient, No Pcp Per  Admit date: 12/06/2016 Discharge date: 12/13/2016  Time spent:  minutes  Recommendations for Outpatient Follow-up:   Bifrontal SAH s/p Fall  -NS has now signed off  -[facility] has declared him stable for discharge and outpatient follow up. No change. - Anticoagulants restarted 12/10/2016. He is on Xarelto. (Pt assistance for this med confirmed 12/11/16)   Small R Pneumothorax  -stable on chest x-ray today   Syncope and collapse -CHF team arranging for LifeVest prior to discharge    Chronic Hypotension -Baseline SBP appears to be ~90 per review of old records - appears stable at this time -Discharge medication per cardiology instructions   Chronic systolic CHF -Echocardiogram 11/23 EF 20%  -medication per CHF team -Negative beta blocker per cardiology  Left Ventricle Thrombus  -Xarelto resumed on 11/25 monitor closely for sequela of bleeding [facility]   Hyperkalemia  Resolved   Hyponatremia  Improved with resumption of diuretic   CKD Stage 3  Renal function is stable   Hypothyroidism  Continue home medical therapy   Diabetes Type 2 uncontrolled with Complication -11/22 Hemoglobin A1c = 11.5 -Patient has been seen by diabetic coordinator -Consult placed to diabetic nutritionist        Discharge Diagnoses:  Active Problems:   Traumatic subarachnoid hematoma with loss of consciousness (HCC)   Traumatic subarachnoid hemorrhage with loss of consciousness of 30 minutes or less (HCC)   Anticoagulated   Syncope   Hypotension   Hyperkalemia   Discharge Condition: Stable  Diet recommendation: American diabetic Association  Filed Weights   12/11/16 0600 12/12/16 0500 12/13/16 0400  Weight: 198 lb 10.2 oz (90.1 kg) 196 lb 13.9 oz (89.3 kg) 197 lb 5 oz (89.5 kg)    History of present illness:  46 year BM PMHx RV/LV dysfxn (EF 15%), LV  thrombus on Xarelto, Diabetes Type 2 uncontrolled with Renal Complication, CKD stage 3, Hypothyroidism    [facility] following a fall. [facility] [employer] when he had a syncopal event, falling and hitting the back of his head on tile floor. Patient woke up disoriented one minute later. CT Head with noted bifrontal SAH. [facility].  Progress hospitalization seen by neurosurgery for subarachnoid hemorrhage secondary to fall, and small right pneumothorax. Evaluate for syncope and collapse/chronic systolic CHF by CHF team and cleared for discharge. Patient stable for discharge.  Procedures: 11/23 Echocardiogram:Left ventricle: The cavity size was mildly dilated. Wall   thickness was normal. The estimated ejection fraction was 20%.   Diffuse hypokinesis. Left ventricular diastolic function   parameters were normal. - Atrial septum: No defect or patent foramen ovale was identified.   Consultations: Neurosurgery CHF team     Discharge Exam: Vitals:   12/13/16 0000 12/13/16 0400 12/13/16 0844 12/13/16 1200  BP: 110/68 101/65  97/70  Pulse: 88 100  99  Resp: 16 16  16   Temp: 98.2 F (36.8 C) 98.1 F (36.7 C) 98.1 F (36.7 C) 98 F (36.7 C)  TempSrc: Oral Oral Oral Oral  SpO2: 96% 95%  94%  Weight:  197 lb 5 oz (89.5 kg)    Height:        General: A/O 4, No acute respiratory distress Neck:  Negative scars, masses, torticollis, lymphadenopathy, JVD Lungs: Clear to auscultation bilaterally without wheezes or crackles Cardiovascular:  Regular rate and rhythm without murmur gallop or rub normal S1 and S2   Discharge Instructions   Allergies as of 12/13/2016      Reactions   Bee Venom       Medication List    STOP taking these medications   hydrALAZINE 25 MG tablet Commonly known as:  APRESOLINE   predniSONE 20 MG tablet Commonly known as:  DELTASONE   sacubitril-valsartan 24-26 MG Commonly  known as:  ENTRESTO     TAKE these medications   aspirin 81 MG EC tablet Take 1 tablet (81 mg total) by mouth daily.   digoxin 0.125 MG tablet Commonly known as:  LANOXIN Take 1 tablet (0.125 mg total) by mouth daily.   insulin aspart 100 UNIT/ML FlexPen Commonly known as:  NOVOLOG Inject 3 Units into the skin 3 (three) times daily with meals.   insulin glargine 100 unit/mL Sopn Commonly known as:  LANTUS Inject 0.2 mLs (20 Units total) into the skin at bedtime.   isosorbide mononitrate 30 MG 24 hr tablet Commonly known as:  IMDUR Take 1 tablet (30 mg total) by mouth daily.   ivabradine 5 MG Tabs tablet Commonly known as:  CORLANOR Take 0.5 tablets (2.5 mg total) by mouth 2 (two) times daily with a meal.   losartan 25 MG tablet Commonly known as:  COZAAR Take 1 tablet (25 mg total) by mouth at bedtime.   PEN NEEDLES 31GX5/16" 31G X 8 MM Misc Use as directed   potassium chloride SA 20 MEQ tablet Commonly known as:  K-DUR,KLOR-CON Take 1 tablet (20 mEq total) by mouth daily. What changed:  how much to take   rivaroxaban 20 MG Tabs tablet Commonly known as:  XARELTO Take 1 tablet (20 mg total) daily with supper by mouth.   spironolactone 25 MG tablet Commonly known as:  ALDACTONE Take 1 tablet (25 mg total) by mouth daily.   torsemide 20 MG tablet Commonly known as:  DEMADEX Take 1 tablet (20 mg total) by mouth daily. What changed:  how much to take            Durable Medical Equipment  (From admission, onward)        Start     Ordered   12/11/16 1101  For home use only DME Vest life vest  Once     12/11/16 1100     Allergies  Allergen Reactions  . Bee Venom    Follow-up Information    [facility] Follow up on 12/21/2016.   Specialty:  Cardiology Why:  at 0900 for post hospital follow up. Parking code for Dec is 8002. Contact information: [address] [id number] mc [place]  [address] [phone]       [facility] [place] [place]. Go on 12/18/2016.   Why:  Mondays 10am-12pm for eligibility appointment.  [employer] Income Tax Return, Months worth of bills to same address, months worth of pay stubbs, bank statements, and proof of any other forms of income.   TO ESTABLISH PRIMARY CARE Contact information: [address] [place] [address]           The results of significant diagnostics from this hospitalization (including imaging, microbiology, ancillary and laboratory) are listed below for reference.    Significant Diagnostic Studies: Ct Head Wo Contrast  Result Date: 12/08/2016 CLINICAL DATA:  Bifrontal hemorrhagic contusion. Subdural hematoma follow-up. EXAM: CT HEAD WITHOUT CONTRAST TECHNIQUE: Contiguous axial images  were obtained from the base of the skull through the vertex without intravenous contrast. COMPARISON:  CT head without contrast 12/07/2016 and 12/06/2016. FINDINGS: Brain: Left frontal hemorrhagic contusion is similar the prior study. Surrounding edema is slightly more prominent. Both parenchymal contusion and subarachnoid hemorrhage are again seen without significant interval change in the overall volume. There is likely a subdural component as well. Hemorrhagic contusion along the medial inferior right frontal lobe is stable with slight increase in surrounding edema. Areas of hemorrhagic contusion involving the medial temporal lobes are again noted, left greater than right. Edema has increased without significant change in the hyperdense components. The basal ganglia are intact. Insular ribbon is normal bilaterally. The brainstem and cerebellum are normal. No other acute infarct is present. Vascular: No hyperdense vessel or unexpected calcification. Skull: The calvarium is intact. No acute or healing fractures are present. Sinuses/Orbits: A high-density fluid collection in the left maxillary sinus represents hemorrhage.  Occult left maxillary sinus fracture or likely left orbital floor fracture is again suspected. The globes and orbits are otherwise within normal limits. IMPRESSION: 1. Increasing conspicuity of edema surrounding hemorrhagic contusion involving the inferior left frontal lobe without significant change in the hemorrhagic components of the parenchyma, subarachnoid space or subdural space. 2. Increasing edema surrounding smaller areas of parenchymal hemorrhagic contusion of the inferior medial right frontal lobe and left greater than right temporal lobes. 3. No new areas of hemorrhage or infarct. 4. High-density material in the left maxillary sinus compatible with blood and occult fracture. Electronically Signed   By: Marin Roberts M.D.   On: 12/08/2016 11:29   Ct Head Wo Contrast  Result Date: 12/07/2016 CLINICAL DATA:  Follow-up of subarachnoid hemorrhage. EXAM: CT HEAD WITHOUT CONTRAST TECHNIQUE: Contiguous axial images were obtained from the base of the skull through the vertex without intravenous contrast. COMPARISON:  Head CT 12/06/2016 FINDINGS: Brain: Left frontal hemorrhagic contusion has increased, with intraparenchymal hematoma component now measuring 2.4 x 1.6 x 1.6 cm (volume = 3.2 cm^3). The overlying left frontal polar subarachnoid hemorrhage is also slightly increased. Right frontal pole subarachnoid blood is minimally increased. Subarachnoid blood at the anterior left temporal lobe is more apparent than on the prior study. There is also trace subarachnoid blood at the anterior right temporal lobe. There is no midline shift or other mass effect. Small amount of blood at the superior aspect of the left sylvian fissure is unchanged. No hydrocephalus or intraventricular extension of blood. The area of hypoattenuation seen on the previous study in the anterior left temporal lobe is no longer present and was probably artifactual. Vascular: No hyperdense vessel or unexpected calcification. Skull:  Normal. Negative for fracture or focal lesion. Sinuses/Orbits: Unchanged fluid level in the left maxillary sinus. Normal orbits. Other: None IMPRESSION: 1. Increased size of left frontal lobe hemorrhagic contusion and associated overlying subarachnoid blood. Mild surrounding edema but no significant mass effect. 2. The other areas of subarachnoid hemorrhage at the right frontal poles and both anterior temporal lobes are unchanged. 3. No hydrocephalus or intraventricular hemorrhage. Electronically Signed   By: Deatra Robinson M.D.   On: 12/07/2016 04:06   Ct Head Wo Contrast  Result Date: 12/06/2016 CLINICAL DATA:  Syncope/fainting at [employer] today. Cardiac etiology suspected. EXAM: CT HEAD WITHOUT CONTRAST CT CERVICAL SPINE WITHOUT CONTRAST TECHNIQUE: Multidetector CT imaging of the head and cervical spine was performed following the standard protocol without intravenous contrast. Multiplanar CT image reconstructions of the cervical spine were also generated. COMPARISON:  None. FINDINGS:  CT HEAD FINDINGS Brain: Bifrontal subarachnoid hemorrhage, small on the right and moderate on the left. Mild associated edema/contusion in the left frontal lobe. Small amount of subarachnoid hemorrhage also in the left frontoparietal lobe more superiorly. Minimal blood in the circle Willis. Ill-defined low density in the left anterior temporal lobe, which may be secondary to artifact. No midline shift or hydrocephalus. Vascular: No hyperdense vessel. Skull: No fracture or focal lesion. Sinuses/Orbits: Fluid level in the left maxillary sinus with bubbly debris and high-density material. Possible all left inferior orbital fracture, age indeterminate. Other: None. CT CERVICAL SPINE FINDINGS Alignment: Straightening of normal lordosis. No traumatic subluxation. Skull base and vertebrae: No acute fracture. Skullbase is intact. Multilevel endplate changes, likely related to degenerative disc disease. Soft tissues and spinal canal: No  prevertebral fluid or swelling. No visible canal hematoma. Disc levels:  Diffuse disc space narrowing and endplate spurring. Upper chest: No acute abnormality. Other: None. IMPRESSION: 1. Bifrontal subarachnoid hemorrhage, traumatic pattern, mild to moderate amount. Probable associated edema/contusion in the left frontal lobe. Minimal blood tracks into the circle of Willis and the left frontal lobe more superiorly. 2. Vague low-density in the anterior left temporal lobe, possibly related to streak artifact/volume averaging. Consider MRI for further evaluation after coalescence of acute event. 3. Multilevel degenerative change in the cervical spine without acute fracture or subluxation. 4. Fluid level in the left maxillary sinus, with some high-density material. Possible fracture of the left inferior orbital wall of uncertain acuity. Consider face CT if there is clinical concern for acute facial injury. Critical Value/emergent results were called by telephone at the time of interpretation on 12/06/2016 at 7:47 pm to Dr. Dione Booze , who verbally acknowledged these results. Electronically Signed   By: Rubye Oaks M.D.   On: 12/06/2016 19:48   Ct Cervical Spine Wo Contrast  Result Date: 12/06/2016 CLINICAL DATA:  Syncope/fainting at [employer] today. Cardiac etiology suspected. EXAM: CT HEAD WITHOUT CONTRAST CT CERVICAL SPINE WITHOUT CONTRAST TECHNIQUE: Multidetector CT imaging of the head and cervical spine was performed following the standard protocol without intravenous contrast. Multiplanar CT image reconstructions of the cervical spine were also generated. COMPARISON:  None. FINDINGS: CT HEAD FINDINGS Brain: Bifrontal subarachnoid hemorrhage, small on the right and moderate on the left. Mild associated edema/contusion in the left frontal lobe. Small amount of subarachnoid hemorrhage also in the left frontoparietal lobe more superiorly. Minimal blood in the circle Willis. Ill-defined low density in the  left anterior temporal lobe, which may be secondary to artifact. No midline shift or hydrocephalus. Vascular: No hyperdense vessel. Skull: No fracture or focal lesion. Sinuses/Orbits: Fluid level in the left maxillary sinus with bubbly debris and high-density material. Possible all left inferior orbital fracture, age indeterminate. Other: None. CT CERVICAL SPINE FINDINGS Alignment: Straightening of normal lordosis. No traumatic subluxation. Skull base and vertebrae: No acute fracture. Skullbase is intact. Multilevel endplate changes, likely related to degenerative disc disease. Soft tissues and spinal canal: No prevertebral fluid or swelling. No visible canal hematoma. Disc levels:  Diffuse disc space narrowing and endplate spurring. Upper chest: No acute abnormality. Other: None. IMPRESSION: 1. Bifrontal subarachnoid hemorrhage, traumatic pattern, mild to moderate amount. Probable associated edema/contusion in the left frontal lobe. Minimal blood tracks into the circle of Willis and the left frontal lobe more superiorly. 2. Vague low-density in the anterior left temporal lobe, possibly related to streak artifact/volume averaging. Consider MRI for further evaluation after coalescence of acute event. 3. Multilevel degenerative change in the cervical spine  without acute fracture or subluxation. 4. Fluid level in the left maxillary sinus, with some high-density material. Possible fracture of the left inferior orbital wall of uncertain acuity. Consider face CT if there is clinical concern for acute facial injury. Critical Value/emergent results were called by telephone at the time of interpretation on 12/06/2016 at 7:47 pm to Dr. Dione Booze , who verbally acknowledged these results. Electronically Signed   By: Rubye Oaks M.D.   On: 12/06/2016 19:48   Dg Chest Port 1 View  Result Date: 12/11/2016 CLINICAL DATA:  46 y/o  M; follow-up of pneumothorax. EXAM: PORTABLE CHEST 1 VIEW COMPARISON:  12/11/2016 chest  radiograph FINDINGS: Stable normal cardiac silhouette. Diminution of right pneumothorax with trace residual. Stable right basilar opacity and small effusion. Bones are unremarkable. IMPRESSION: Diminution in right pneumothorax with trace residual. Stable right basilar opacity and small effusion. Electronically Signed   By: Mitzi Hansen M.D.   On: 12/11/2016 21:15   Dg Chest Port 1 View  Result Date: 12/11/2016 CLINICAL DATA:  Pneumothorax EXAM: PORTABLE CHEST 1 VIEW COMPARISON:  12/10/2016 FINDINGS: Left pneumothorax again noted, not significantly changed since prior study. Small right effusion. Right lower lobe atelectasis or infiltrate. Left lung is clear. Heart is normal size. IMPRESSION: Stable small right pneumothorax. Right basilar atelectasis or infiltrate with small right effusion. Electronically Signed   By: Charlett Nose M.D.   On: 12/11/2016 09:15   Dg Chest Port 1 View  Result Date: 12/10/2016 CLINICAL DATA:  Pneumothorax. EXAM: PORTABLE CHEST 1 VIEW COMPARISON:  12/09/2016 FINDINGS: The cardiac silhouette is normal in size. A small right-sided pneumothorax appears minimally larger than on the prior study. Mild right basilar opacity is unchanged. The left lung remains clear. There may be a tiny right pleural effusion. IMPRESSION: 1. Minimally increased size of small right pneumothorax. 2. Unchanged right basilar atelectasis/infiltrate. Electronically Signed   By: Sebastian Ache M.D.   On: 12/10/2016 07:50   Dg Chest Port 1 View  Result Date: 12/09/2016 CLINICAL DATA:  Pulmonary edema.  Congestive heart failure. EXAM: PORTABLE CHEST 1 VIEW COMPARISON:  12/08/2016 FINDINGS: Heart size remains normal. Atelectasis or infiltrate in the right lower lobe shows no significant change. A tiny approximately 5% right pneumothorax is seen along the lateral chest wall. Left lung remains clear. IMPRESSION: Tiny less than 5% right pneumothorax. No significant change in right lower lobe  atelectasis or infiltrate. Electronically Signed   By: Myles Rosenthal M.D.   On: 12/09/2016 07:44   Dg Chest Port 1 View  Result Date: 12/08/2016 CLINICAL DATA:  Syncopal episode. EXAM: PORTABLE CHEST 1 VIEW COMPARISON:  Chest x-ray 11/06/2016 FINDINGS: Heart size normal. Interval significant clearing of right mid lung field and right base infiltrate and right-sided pleural effusion . No pneumothorax. No acute bony abnormality . IMPRESSION: Interval significant clearing of right mid lung field and right base infiltrate and right-sided pleural effusion. Mild residual. Electronically Signed   By: Maisie Fus  Register   On: 12/08/2016 07:13    Microbiology: Recent Results (from the past 240 hour(s))  MRSA PCR Screening     Status: None   Collection Time: 12/06/16 11:10 PM  Result Value Ref Range Status   MRSA by PCR NEGATIVE NEGATIVE Final    Comment:        The GeneXpert MRSA Assay (FDA approved for NASAL specimens only), is one component of a comprehensive MRSA colonization surveillance program. It is not intended to diagnose MRSA infection nor to guide or monitor treatment  for MRSA infections.      Labs: Basic Metabolic Panel: Recent Labs  Lab 12/07/16 0137  12/07/16 1608  12/09/16 0411 12/10/16 0551 12/11/16 0845 12/12/16 0427 12/13/16 0403  NA 129*  --  133*   < > 135 131* 134* 133* 132*  K 6.1*   < > 5.3*   < > 4.0 4.2 4.1 3.8 4.1  CL 94*  --  98*   < > 98* 97* 95* 94* 94*  CO2 22  --  19*   < > 27 25 29 28 28   GLUCOSE 529*  --  140*   < > 118* 176* 216* 185* 148*  BUN 42*  --  41*   < > 37* 28* 24* 28* 23*  CREATININE 1.41*  --  1.28*   < > 1.10 1.05 1.19 1.33* 1.15  CALCIUM 9.5  --  9.6   < > 9.4 9.0 9.4 9.3 9.3  MG 2.3  --  2.2  --  2.3  --   --   --  2.1  PHOS 3.7  --  4.2  --  4.0  --   --   --   --    < > = values in this interval not displayed.   Liver Function Tests: Recent Labs  Lab 12/06/16 1802 12/08/16 0501 12/10/16 0551  AST 22 34 26  ALT 20 22 20    ALKPHOS 88 87 84  BILITOT 0.7 1.1 0.7  PROT 7.5 8.0 6.7  ALBUMIN 3.0* 2.8* 2.4*   No results for input(s): LIPASE, AMYLASE in the last 168 hours. No results for input(s): AMMONIA in the last 168 hours. CBC: Recent Labs  Lab 12/06/16 1802  12/08/16 0501 12/09/16 0411 12/10/16 0551 12/11/16 0845 12/13/16 0403  WBC 17.8*   < > 16.5* 11.5* 12.3* 12.7* 13.3*  NEUTROABS 14.5*  --  11.9*  --   --   --  10.0*  HGB 15.4   < > 15.9 15.4 14.4 15.0 13.6  HCT 47.3   < > 47.4 45.5 42.7 44.6 40.3  MCV 86.2   < > 84.3 84.6 84.6 85.0 84.3  PLT 310   < > 309 298 263 284 240   < > = values in this interval not displayed.   Cardiac Enzymes: Recent Labs  Lab 12/06/16 1802 12/07/16 0024 12/07/16 0459  TROPONINI 0.04* 0.03* <0.03   BNP: BNP (last 3 results) Recent Labs    11/06/16 1621  BNP 961.6*    ProBNP (last 3 results) No results for input(s): PROBNP in the last 8760 hours.  CBG: Recent Labs  Lab 12/12/16 1144 12/12/16 1622 12/12/16 2056 12/13/16 0841 12/13/16 1156  GLUCAP 229* 232* 269* 225* 246*       Signed:  Carolyne Littles, MD [facility] [phone] pager

## 2016-12-13 NOTE — Progress Notes (Signed)
Pt discharge home per MD order, instructions given, medications given, pt verbalized understanding all questions answered

## 2016-12-13 NOTE — Care Management Note (Signed)
Case Management Note  Patient Details  Name: Benjamin Sherman MRN: [id number] Date of Birth: [birth year]  Subjective/Objective:    Pt admitted on 12/06/16 s/p fall with syncopal event.  He sustained bifrontal SAH.  PTA, pt independent, lives at home with roommate.                  Action/Plan: Met with pt to discuss discharge planning.  Pt has no PCP or insurance.  Pt interested in follow up at [facility] in [place]:  Eligibility screenings held each Monday between 10a-12p.  Requirements for clinic include income poverty level <200% of poverty level; will need to bring federal income tax return, months worth of bills to same address, months worth of pay stubs, bank statements, valid [place] driver's license, and documentation of any other forms of income to eligibility screening.  Once eligible, PCP appointment will be made.  Pt states he has transportation to physician's appointments with the assistance of friends.       Pt is eligible for medication assistance through [facility] program, which will allow pt to receive 34 day supply of meds for $3 each Rx.  Pt states he can afford $3 copay for meds.       Notified by HF team NP that pt will need Lifevest for home; called [name] with [facility], and he has already received referral from HF team.        Pt may benefit from [facility] for CHF follow up at discharge.  Please leave orders for [facility] and CSW if you agree.    Expected Discharge Date:                  Expected Discharge Plan:  [place]  In-House Referral:  Clinical Social Work  Discharge planning Services  CM Consult, [facility], Medication Assistance, [facility]  Post Acute Care Choice:    Choice offered to:     DME Arranged:  Other see comment(Lifevest) DME Agency:  [facility]  HH Arranged:    [place] Agency:     Status of Service:  Completed, signed off  If discussed at [place] of Stay Meetings, dates discussed:    Additional Comments:   12/11/16 J. Abdur Hoglund, RN, BSN Will continue to follow progress/follow up at discharge for assistance with medications and home Lifevest.   12/13/16 J. Nicholi Ghuman, RN, BSN Pt is uninsured, but is eligible for medication assistance through [employer] letter given with explanation of program benefits.  Pt states Lifevest fitted last evening; he feels comfortable with vest, and has no questions at this time.  Pt reminded of eligibility appointment on Monday at [facility] for PCP follow up.  He states this clinic is only a couple of blocks from his house, and plans to follow up on Monday.  He is appreciative of assistance.   Await CSW assistance for transportation needs.    [name], RN 12/13/2016, 10:14 AM [phone]

## 2016-12-13 NOTE — Progress Notes (Signed)
Inpatient Diabetes Program Recommendations  AACE/ADA: New Consensus Statement on Inpatient Glycemic Control (2015)  Target Ranges:  Prepandial:   less than 140 mg/dL      Peak postprandial:   less than 180 mg/dL (1-2 hours)      Critically ill patients:  140 - 180 mg/dL   Lab Results  Component Value Date   GLUCAP 225 (H) 12/13/2016   HGBA1C 11.5 (H) 12/07/2016    Review of Glycemic Control Results for Benjamin Sherman, Benjamin Sherman (MRN [id number]) as of 12/13/2016 10:39  Ref. Range 12/12/2016 07:44 12/12/2016 11:44 12/12/2016 16:22 12/12/2016 20:56 12/13/2016 08:41  Glucose-Capillary Latest Ref Range: 65 - 99 mg/dL 111 (H) 735 (H) 670 (H) 269 (H) 225 (H)   Inpatient Diabetes Program Recommendations:   Postprandial CBGs continue to be elevated. Please consider: -Increase Novolog meal coverage to 10 units tid if eats 50%.  Thank you, Billy Fischer. Yudit Modesitt, RN, MSN, CDE  Diabetes Coordinator Inpatient Glycemic Control Team Team Pager [phone] (8am-5pm) 12/13/2016 10:40 AM

## 2016-12-13 NOTE — Progress Notes (Signed)
Advanced Heart Failure Rounding Note  Primary Cardiologist: Dr. Haroldine Laws   Subjective:    Lifevest fit 12/12/16 pm  Feeling good today. Anxious to go home. Denies SOB, CP, or palpitations. No syncope.   Weight 197 lbs (Was 207 at office visit 11/28/16)  CT Head 11/23 1. Increasing conspicuity of edema surrounding hemorrhagic contusion involving the inferior left frontal lobe without significant change in the hemorrhagic components of the parenchyma, subarachnoid space or subdural space. 2. Increasing edema surrounding smaller areas of parenchymal hemorrhagic contusion of the inferior medial right frontal lobe and left greater than right temporal lobes. 3. No new areas of hemorrhage or infarct. 4. High-density material in the left maxillary sinus compatible with blood and occult fracture.  12/08/2016 ECHO EF 20% Diffuse HK (reviewed personally)  Objective:   Weight Range: 197 lb 5 oz (89.5 kg) Body mass index is 26.76 kg/m.   Vital Signs:   Temp:  [98.1 F (36.7 C)-98.4 F (36.9 C)] 98.1 F (36.7 C) (11/28 0400) Pulse Rate:  [88-100] 100 (11/28 0400) Resp:  [15-20] 16 (11/28 0400) BP: (101-112)/(65-81) 101/65 (11/28 0400) SpO2:  [88 %-99 %] 95 % (11/28 0400) Weight:  [197 lb 5 oz (89.5 kg)] 197 lb 5 oz (89.5 kg) (11/28 0400) Last BM Date: 12/12/16  Weight change: Filed Weights   12/11/16 0600 12/12/16 0500 12/13/16 0400  Weight: 198 lb 10.2 oz (90.1 kg) 196 lb 13.9 oz (89.3 kg) 197 lb 5 oz (89.5 kg)   Intake/Output:   Intake/Output Summary (Last 24 hours) at 12/13/2016 0834 Last data filed at 12/13/2016 0330 Gross per 24 hour  Intake 820 ml  Output 1950 ml  Net -1130 ml    Physical Exam    General: Well appearing. No resp difficulty. HEENT: Normal Neck: Supple. JVP 5-6. Carotids 2+ bilat; no bruits. No thyromegaly or nodule noted. Cor: PMI nondisplaced. RRR, +S3. Lungs: CTAB, normal effort. Abdomen: Soft, non-tender, non-distended, no HSM. No  bruits or masses. +BS  Extremities: No cyanosis, clubbing, or rash. R and LLE no edema.  Neuro: Alert & orientedx3, cranial nerves grossly intact. moves all 4 extremities w/o difficulty. Affect pleasant   Telemetry   NSR/ST 90-100s, occ PVCs. Personally reviewed.   EKG    Sinus tach on admit  Labs    CBC Recent Labs    12/11/16 0845 12/13/16 0403  WBC 12.7* 13.3*  NEUTROABS  --  10.0*  HGB 15.0 13.6  HCT 44.6 40.3  MCV 85.0 84.3  PLT 284 665   Basic Metabolic Panel Recent Labs    12/12/16 0427 12/13/16 0403  NA 133* 132*  K 3.8 4.1  CL 94* 94*  CO2 28 28  GLUCOSE 185* 148*  BUN 28* 23*  CREATININE 1.33* 1.15  CALCIUM 9.3 9.3  MG  --  2.1   Liver Function Tests No results for input(s): AST, ALT, ALKPHOS, BILITOT, PROT, ALBUMIN in the last 72 hours. No results for input(s): LIPASE, AMYLASE in the last 72 hours. Cardiac Enzymes No results for input(s): CKTOTAL, CKMB, CKMBINDEX, TROPONINI in the last 72 hours.  BNP: BNP (last 3 results) Recent Labs    11/06/16 1621  BNP 961.6*   ProBNP (last 3 results) No results for input(s): PROBNP in the last 8760 hours.  D-Dimer No results for input(s): DDIMER in the last 72 hours. Hemoglobin A1C No results for input(s): HGBA1C in the last 72 hours. Fasting Lipid Panel No results for input(s): CHOL, HDL, LDLCALC, TRIG, CHOLHDL, LDLDIRECT in  the last 72 hours. Thyroid Function Tests No results for input(s): TSH, T4TOTAL, T3FREE, THYROIDAB in the last 72 hours.  Invalid input(s): FREET3  Other results:  Imaging   No results found.  Medications:    Scheduled Medications: . digoxin  0.125 mg Oral Daily  . insulin aspart  0-20 Units Subcutaneous TID WC  . insulin aspart  0-5 Units Subcutaneous QHS  . insulin aspart  6 Units Subcutaneous TID WC  . insulin glargine  20 Units Subcutaneous QHS  . insulin starter kit- pen needles  1 kit Other Once  . isosorbide mononitrate  30 mg Oral Daily  . ivabradine  2.5  mg Oral BID WC  . losartan  25 mg Oral QHS  . rivaroxaban  20 mg Oral Q supper  . 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   Admitted with syncope with fall --> SAH   Assessment/Plan   1. SAH - Bifrontal  - NSU has declared him stable for discharge and outpatient follow up. No change. - Anticoagulants restarted 12/10/2016. He is on Xarelto. (Pt assistance for this med confirmed 12/11/16) 2. Syncope-  ? Hypotension, though suspect Arrhythmia  -Given low EF he is at risk for VT.  -Life Vest fit and in room for discharge.  - Pt aware and reminded that he cannot drive for 6 months. (05/2017) 3. Chronic Systolic Heart Failure NICM - ? 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 Had been on steroid taper.  - ECHO 12/08/2016 LVEF 20%.  - Volume status stable on exam. He has been off torsemide. Will restart 20 mg daily for home.  - Continue ivabradine 2.5 mg twice a day.  - Continue digoxin 0.125 mg daily.  - Continue spiro 25 mg daily - Continue imdur 30 mg daily. Not on hydral at this time with soft pressures.  - No BB at this time.  - Pressures soft but stable back on losartan at bedtime.  - Renal function stable.  4 LV Thrombus -On xarelto . Restarted 11/25.  -Not on coumadin because he was unable to follow up for INR checks.  - Denies bleeding.  5. Hypothyroidism  - Needs PCP.  6. DMII - Hgb A1C 11.5 12/07/2016 - Needs PCP. On insulin.  7. AKI  - Creatinine 1.05 -> 1.19 -> 1.33 -> 1.15.  - Torsemide at lower dose as above.   HF meds slowly resumed. He is stable for home from HF standpoint. HAVE SENT  cardiac meds to outpatient pharmacy via HF fund.   Medication concerns reviewed with patient and pharmacy team. Barriers identified: Yes. Pt has no insurance and is  unable to pay for his meds. This has been covered thus far by the HF fund.  Concerned about his health literacy. He has very poor insight into his condition.   Transportation may also be an issue. Message sent to HFSW. We can provide taxi transport to the HF clinic, and may be able to help patient get set up with [facility].   Length of Stay: [place], [place]  12/13/2016, 8:34 AM  Advanced Heart Failure Team Pager [phone] (M-F; 7a - 4p)   Please contact [facility] for night-coverage after hours (4p -7a ) and weekends on [email or web address] Patient seen and examined with the  above-signed Engineer, building services. I personally reviewed laboratory data, imaging studies and relevant notes. I independently examined the patient and formulated the important aspects of the plan. I have edited the note to reflect any of my changes or salient points. I have personally discussed the plan with the patient and/or family.  Rhythm table on tele. Volume status ok. He is stable for d/c. LifeVest has been placed and meds reviewed and sent to outpatient clinic to obtain via HF fund. Will follow closely in HF Clinic.   Glori Bickers, MD  5:26 PM

## 2016-12-14 ENCOUNTER — Encounter (HOSPITAL_COMMUNITY): Payer: Self-pay | Admitting: Cardiology

## 2016-12-18 ENCOUNTER — Encounter (HOSPITAL_COMMUNITY): Payer: Self-pay

## 2016-12-20 ENCOUNTER — Telehealth (HOSPITAL_COMMUNITY): Payer: Self-pay | Admitting: Pharmacist

## 2016-12-20 NOTE — Telephone Encounter (Signed)
[employer] patient assistance approved for Corlanor 5 mg BID through 12/20/17.   Tyler Deis. Bonnye Fava, PharmD, BCPS, CPP Clinical Pharmacist Pager: [phone] Phone: [phone] 12/20/2016 11:51 AM

## 2016-12-21 ENCOUNTER — Encounter (HOSPITAL_COMMUNITY): Payer: Self-pay

## 2016-12-22 ENCOUNTER — Encounter (HOSPITAL_COMMUNITY): Payer: Self-pay

## 2017-03-23 ENCOUNTER — Encounter (HOSPITAL_COMMUNITY): Payer: Self-pay | Admitting: *Deleted

## 2017-03-23 NOTE — Progress Notes (Signed)
Received medical records request from [facility] on 03/23/2017. CASE #[id number]  Requested records faxed today to [phone].  Original request will be scanned to patient's electronic medical record.

## 2017-03-25 ENCOUNTER — Emergency Department (HOSPITAL_COMMUNITY): Payer: Medicaid Other

## 2017-03-25 ENCOUNTER — Encounter (HOSPITAL_COMMUNITY): Payer: Self-pay | Admitting: Emergency Medicine

## 2017-03-25 ENCOUNTER — Emergency Department (HOSPITAL_COMMUNITY)
Admission: EM | Admit: 2017-03-25 | Discharge: 2017-03-25 | Disposition: A | Discharge status: Home / Self Care | Payer: Medicaid Other | Attending: Emergency Medicine | Admitting: Emergency Medicine

## 2017-03-25 DIAGNOSIS — Z9114 Patient's other noncompliance with medication regimen: Secondary | ICD-10-CM | POA: Insufficient documentation

## 2017-03-25 DIAGNOSIS — I5023 Acute on chronic systolic (congestive) heart failure: Secondary | ICD-10-CM | POA: Diagnosis not present

## 2017-03-25 DIAGNOSIS — E1122 Type 2 diabetes mellitus with diabetic chronic kidney disease: Secondary | ICD-10-CM | POA: Insufficient documentation

## 2017-03-25 DIAGNOSIS — N182 Chronic kidney disease, stage 2 (mild): Secondary | ICD-10-CM | POA: Diagnosis not present

## 2017-03-25 DIAGNOSIS — Z87891 Personal history of nicotine dependence: Secondary | ICD-10-CM | POA: Insufficient documentation

## 2017-03-25 DIAGNOSIS — E039 Hypothyroidism, unspecified: Secondary | ICD-10-CM | POA: Diagnosis not present

## 2017-03-25 DIAGNOSIS — R0602 Shortness of breath: Secondary | ICD-10-CM | POA: Diagnosis present

## 2017-03-25 DIAGNOSIS — I509 Heart failure, unspecified: Secondary | ICD-10-CM

## 2017-03-25 LAB — CBC
HCT: 38.4 % — ABNORMAL LOW (ref 39.0–52.0)
Hemoglobin: 12.1 g/dL — ABNORMAL LOW (ref 13.0–17.0)
MCH: 28.7 pg (ref 26.0–34.0)
MCHC: 31.5 g/dL (ref 30.0–36.0)
MCV: 91.2 fL (ref 78.0–100.0)
Platelets: 274 10*3/uL (ref 150–400)
RBC: 4.21 MIL/uL — ABNORMAL LOW (ref 4.22–5.81)
RDW: 15.3 % (ref 11.5–15.5)
WBC: 7 10*3/uL (ref 4.0–10.5)

## 2017-03-25 LAB — BASIC METABOLIC PANEL
Anion gap: 10 (ref 5–15)
BUN: 16 mg/dL (ref 6–20)
CO2: 19 mmol/L — ABNORMAL LOW (ref 22–32)
Calcium: 8.2 mg/dL — ABNORMAL LOW (ref 8.9–10.3)
Chloride: 102 mmol/L (ref 101–111)
Creatinine, Ser: 1.33 mg/dL — ABNORMAL HIGH (ref 0.61–1.24)
GFR calc Af Amer: >60 mL/min (ref >60)
GFR calc non Af Amer: >60 mL/min (ref >60)
Glucose, Bld: 199 mg/dL — ABNORMAL HIGH (ref 65–99)
Potassium: 4.3 mmol/L (ref 3.5–5.1)
Sodium: 131 mmol/L — ABNORMAL LOW (ref 135–145)

## 2017-03-25 LAB — I-STAT TROPONIN, ED: Troponin i, poc: 0 ng/mL (ref 0.00–0.08)

## 2017-03-25 LAB — BRAIN NATRIURETIC PEPTIDE: B Natriuretic Peptide: 835.4 pg/mL — ABNORMAL HIGH (ref 0.0–100.0)

## 2017-03-25 MED ORDER — FUROSEMIDE 10 MG/ML IJ SOLN
80.0000 mg | Freq: Once | INTRAMUSCULAR | Status: AC
  Administered 2017-03-25: 80 mg via INTRAVENOUS
  Filled 2017-03-25: qty 8

## 2017-03-25 MED ORDER — RIVAROXABAN 20 MG PO TABS: 20.0000 mg | ORAL_TABLET | Freq: Every day | ORAL | 3 refills | Status: DC

## 2017-03-25 MED ORDER — FUROSEMIDE 40 MG PO TABS: 40.0000 mg | ORAL_TABLET | Freq: Every day | ORAL | 0 refills | Status: DC

## 2017-03-25 MED ORDER — INSULIN ASPART 100 UNIT/ML FLEXPEN: 3.0000 [IU] | PEN_INJECTOR | Freq: Three times a day (TID) | SUBCUTANEOUS | 11 refills | Status: DC

## 2017-03-25 MED ORDER — DIGOXIN 125 MCG PO TABS: 0.1250 mg | ORAL_TABLET | Freq: Every day | ORAL | 0 refills | Status: DC

## 2017-03-25 MED ORDER — INSULIN GLARGINE 100 UNITS/ML SOLOSTAR PEN: 20.0000 [IU] | PEN_INJECTOR | Freq: Every day | SUBCUTANEOUS | 11 refills | Status: DC

## 2017-03-25 MED ORDER — SPIRONOLACTONE 25 MG PO TABS: 25.0000 mg | ORAL_TABLET | Freq: Every day | ORAL | 0 refills | Status: DC

## 2017-03-25 MED ORDER — ISOSORBIDE MONONITRATE ER 30 MG PO TB24: 30.0000 mg | ORAL_TABLET | Freq: Every day | ORAL | 0 refills | Status: DC

## 2017-03-25 NOTE — ED Notes (Signed)
ED Provider at bedside. 

## 2017-03-25 NOTE — ED Triage Notes (Signed)
Pt states shortness of breath with edema to bilateral legs since Tuesday, pt is supposed to be taking lasix but "is not sure why he is not currently taking it"

## 2017-03-26 ENCOUNTER — Telehealth (HOSPITAL_COMMUNITY): Payer: Self-pay | Admitting: *Deleted

## 2017-03-26 NOTE — Telephone Encounter (Signed)
Patient seen in the ER over the weekend.  According to notes patient needs follow up in our clinic.  I tried calling patient to schedule appointment but no answer and unable to leave VM.

## 2017-03-27 ENCOUNTER — Encounter (HOSPITAL_COMMUNITY): Payer: Self-pay

## 2017-03-27 ENCOUNTER — Telehealth (HOSPITAL_COMMUNITY): Payer: Self-pay | Admitting: Surgery

## 2017-03-27 ENCOUNTER — Ambulatory Visit (HOSPITAL_COMMUNITY)
Admission: RE | Admit: 2017-03-27 | Discharge: 2017-03-27 | Disposition: A | Discharge status: Home / Self Care | Payer: Medicaid Other | Source: Ambulatory Visit | Attending: Cardiology | Admitting: Cardiology

## 2017-03-27 VITALS — BP 124/106 | HR 106 | Wt 228.2 lb

## 2017-03-27 DIAGNOSIS — I493 Ventricular premature depolarization: Secondary | ICD-10-CM | POA: Insufficient documentation

## 2017-03-27 DIAGNOSIS — E1122 Type 2 diabetes mellitus with diabetic chronic kidney disease: Secondary | ICD-10-CM | POA: Diagnosis not present

## 2017-03-27 DIAGNOSIS — E039 Hypothyroidism, unspecified: Secondary | ICD-10-CM | POA: Diagnosis not present

## 2017-03-27 DIAGNOSIS — E118 Type 2 diabetes mellitus with unspecified complications: Secondary | ICD-10-CM

## 2017-03-27 DIAGNOSIS — I513 Intracardiac thrombosis, not elsewhere classified: Secondary | ICD-10-CM | POA: Diagnosis not present

## 2017-03-27 DIAGNOSIS — R000 Tachycardia, unspecified: Secondary | ICD-10-CM | POA: Diagnosis not present

## 2017-03-27 DIAGNOSIS — I428 Other cardiomyopathies: Secondary | ICD-10-CM | POA: Diagnosis not present

## 2017-03-27 DIAGNOSIS — Z8249 Family history of ischemic heart disease and other diseases of the circulatory system: Secondary | ICD-10-CM | POA: Insufficient documentation

## 2017-03-27 DIAGNOSIS — Z9114 Patient's other noncompliance with medication regimen: Secondary | ICD-10-CM | POA: Diagnosis not present

## 2017-03-27 DIAGNOSIS — Z87891 Personal history of nicotine dependence: Secondary | ICD-10-CM | POA: Insufficient documentation

## 2017-03-27 DIAGNOSIS — N183 Chronic kidney disease, stage 3 (moderate): Secondary | ICD-10-CM | POA: Insufficient documentation

## 2017-03-27 DIAGNOSIS — I5022 Chronic systolic (congestive) heart failure: Secondary | ICD-10-CM

## 2017-03-27 DIAGNOSIS — N182 Chronic kidney disease, stage 2 (mild): Secondary | ICD-10-CM

## 2017-03-27 DIAGNOSIS — I5042 Chronic combined systolic (congestive) and diastolic (congestive) heart failure: Secondary | ICD-10-CM | POA: Diagnosis present

## 2017-03-27 MED ORDER — LOSARTAN POTASSIUM 25 MG PO TABS: 25.0000 mg | ORAL_TABLET | Freq: Every day | ORAL | 2 refills | Status: DC

## 2017-03-27 MED ORDER — DIGOXIN 125 MCG PO TABS: 0.1250 mg | ORAL_TABLET | Freq: Every day | ORAL | 2 refills | Status: DC

## 2017-03-27 MED ORDER — SPIRONOLACTONE 25 MG PO TABS: 12.5000 mg | ORAL_TABLET | Freq: Every day | ORAL | 2 refills | Status: DC

## 2017-03-27 MED ORDER — FUROSEMIDE 40 MG PO TABS: 40.0000 mg | ORAL_TABLET | Freq: Every day | ORAL | 2 refills | Status: DC

## 2017-03-27 MED FILL — LOSARTAN POTASSIUM 25 MG TA: 25 | 30 days supply | Qty: 30 | Fill #0

## 2017-03-27 MED FILL — FUROSEMIDE 40 MG TAB: 40 | 30 days supply | Qty: 30 | Fill #0

## 2017-03-27 MED FILL — DIGOXIN 0.125 MG TABLET: 125 | 30 days supply | Qty: 30 | Fill #0

## 2017-03-27 MED FILL — SPIRONOLACTONE 25 MG TABLET: 25 | 34 days supply | Qty: 17 | Fill #0

## 2017-03-27 NOTE — Progress Notes (Signed)
CSW met with patient in the clinic. Patient reports he has little to no income and no insurance. Patient reports very supportive family who have assisted financially with his rent and household bills over the past few months. Patient apparently has [employer] medicaid which does not cover any medical services. CSW discussed application process for disability and provided starter kit with information needed to begin with Social Security. Patient verbalizes understanding and will follow up and return call to CSW as needed. Raquel Sarna, [place], [place]

## 2017-03-27 NOTE — Progress Notes (Signed)
Advanced Heart Failure Clinic Note   Primary Cardiologist: Dr. Gala Romney   HPI: Benjamin Sherman is a 47 y.o. male with history new diagnosed heart failure 10/2016, NICM, ? eosinophilic cardiomyopathy, LV thrombus, hypothyroid, CKD Stage II-III.   Admitted 10/22 with increased dyspnea and lower extremity edema. ECHO showed severely reduced EF. 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 Placed on steroids and will continue on steroid taper. Diuresed with IV lasix and transitioned to torsemide 40 mg daily. Discharge  weight 206 pounds.   Admitted 11/21 after fall/syncopal episode resulting in bifrontal SAH. Neurosurgery consulted. As he improved xarelto was restarted.  Discharged with Life Vest.  He was given a 30 day supply of medications.   Today he returned for HF follow up and a work released. Seen in [facility] ED on 3/10 with increased dyspnea. He was given a nebulizer  He has not taken any HF meds since the end of December. Says he could not afford. He has not worn Life Vest since discharge. . Overall frustrated and worried about work. Having a hard time paying for food. Mild dyspnea with exertion. Denies PND/Orthopnea.    Review of systems complete and found to be negative unless listed in HPI.    Past Medical History:  Diagnosis Date  . CHF (congestive heart failure) (HCC)   . Diabetes mellitus without complication (HCC)     No current outpatient medications on file.   No current facility-administered medications for this encounter.   He is not taking any medications.    Allergies  Allergen Reactions  . Bee Venom       Social History   Socioeconomic History  . Marital status: Single    Spouse name: Not on file  . Number of children: Not on file  . Years of education: Not on file  . Highest education level: Not on file  Social Needs  . Financial resource strain: Not on file  . Food insecurity - worry: Not on file  . Food insecurity - inability: Not on  file  . Transportation needs - medical: Not on file  . Transportation needs - non-medical: Not on file  Occupational History  . Occupation: security  Tobacco Use  . Smoking status: Former Smoker    Types: Cigars    Last attempt to quit: 11/16/2016    Years since quitting: 0.3  . Smokeless tobacco: Never Used  . Tobacco comment: 6 cigars per wk  Substance and Sexual Activity  . Alcohol use: Yes    Alcohol/week: 8.4 oz    Types: 12 Cans of beer, 2 Shots of liquor per week    Comment: occ  . Drug use: No  . Sexual activity: Not on file  Other Topics Concern  . Not on file  Social History Narrative  . Not on file      Family History  Problem Relation Age of Onset  . CAD Father     Vitals:   03/27/17 1413  BP: (!) 124/106  Pulse: (!) 106  SpO2: 100%  Weight: 228 lb 3.2 oz (103.5 kg)    Wt Readings from Last 3 Encounters:  03/27/17 228 lb 3.2 oz (103.5 kg)  12/13/16 197 lb 5 oz (89.5 kg)  11/28/16 207 lb 12.8 oz (94.3 kg)     PHYSICAL EXAM: General:  Well appearing. No resp difficulty HEENT: normal Neck: supple. JVP ~10 . Carotids 2+ bilat; no bruits. No lymphadenopathy or thryomegaly appreciated. Cor: PMI nondisplaced. Regular rate & rhythm.  No rubs or murmurs. +S3  Lungs: clear Abdomen: soft, nontender, nondistended. No hepatosplenomegaly. No bruits or masses. Good bowel sounds. Extremities: no cyanosis, clubbing, rash, R and LLE 1+ edema Neuro: alert & orientedx3, cranial nerves grossly intact. moves all 4 extremities w/o difficulty. Affect pleasant   ASSESSMENT & PLAN:  1. Chronic Combined Systolic/Diastolic Heart Failure. NICM  - ECHO 11/2016 EF 25-30% CMRI EF 22% --> Set up ECHO  - ? Eosinophilic Myocarditis- on steroid taper  - NYHA IIIb. Volume status elevated in the setting medication noncompliance.  Today I am going to restart lasix 40 mg daily, 12.5 mg spiro daily, losartan 25 mg daily, and digoxin 0.125 mg daily.  We will provide HF meds from HF  fund.  Portable ECHO with EF   - 2. CKD Stage II-III -- I reviewed BMET from 03/25/17. Stable.   3. Hypothyroidism Check TSH next visit.   4. LV Thrombus  Not taking xarelto. Keep off xarelto.   5. DMII - Needs to establish with community health and wellness as planned.   Follow up in 2 weeks and check BMET, TSH, T3 T4 at that time.  Bedside ECHO by Dr Gala Romney EF ~20%.   Tonye Becket, NP 03/27/17

## 2017-03-27 NOTE — Telephone Encounter (Signed)
Patient called requesting return to work form.  He has not been seen in our clinic in quite some time and I see that Susie tried to contact him to make an appointment yesterday.  I have scheduled an appt for today at 2:30pm.  I also sent a message to [facility] regarding the return to work documentation.

## 2017-03-27 NOTE — Patient Instructions (Signed)
START Spironolactone 25 mg (1/2 tablet) once daily.  START Losartan 25 mg tablet once daily.  START Digoxin 0.125 mg tablet once daily.  START Lasix 40 mg tablet once daily.  Follow up 2 weeks with Amy Clegg NP-C.  __________________________________________________________ [place] Code: 9002  Take all medication as prescribed the day of your appointment. Bring all medications with you to your appointment.  Do the following things EVERYDAY: 1) Weigh yourself in the morning before breakfast. Write it down and keep it in a log. 2) Take your medicines as prescribed 3) Eat low salt foods-Limit salt (sodium) to 2000 mg per day.  4) Stay as active as you can everyday 5) Limit all fluids for the day to less than 2 liters

## 2017-03-30 NOTE — ED Provider Notes (Signed)
[facility] EMERGENCY DEPARTMENT Provider Note   CSN: [id number] Arrival date & time: 03/25/17  1310     History   Chief Complaint Chief Complaint  Patient presents with  . Congestive Heart Failure  . Shortness of Breath    HPI Benjamin Sherman is a 47 y.o. male.  HPI Patient is a 47 year old male who presents the emergency department with edema to his bilateral legs and some mild shortness of breath.  Denies fevers and chills.  No productive cough.  History of congestive heart failure and reports noncompliance with his medications.  No significant weight gain.  Denies significant orthopnea.  Continues to urinate   Past Medical History:  Diagnosis Date  . CHF (congestive heart failure) (HCC)   . Diabetes mellitus without complication [facility])     Patient Active Problem List   Diagnosis Date Noted  . Subarachnoid hemorrhage (HCC)   . Pneumothorax on right   . Syncope and collapse   . Chronic systolic CHF (congestive heart failure) (HCC)   . Acute thrombus of left ventricle (HCC)   . CKD (chronic kidney disease), stage III (HCC)   . Diabetes mellitus type 2, uncontrolled, with complications (HCC)   . Traumatic subarachnoid hemorrhage with loss of consciousness of 30 minutes or less (HCC)   . Anticoagulated   . Syncope   . Hypotension   . Hyperkalemia   . Traumatic subarachnoid hematoma with loss of consciousness (HCC) 12/06/2016  . Hypothyroid 11/28/2016  . CKD (chronic kidney disease), stage II 11/28/2016  . DM II (diabetes mellitus, type II), controlled (HCC) 11/28/2016  . LV (left ventricular) mural thrombus without MI 11/28/2016  . CHF (congestive heart failure) (HCC) 11/06/2016    Past Surgical History:  Procedure Laterality Date  . RIGHT/LEFT HEART CATH AND CORONARY ANGIOGRAPHY N/A 11/10/2016   Procedure: RIGHT/LEFT HEART CATH AND CORONARY ANGIOGRAPHY;  Surgeon: Dolores Patty, MD;  Location: [facility] INVASIVE CV LAB;  Service: Cardiovascular;   Laterality: N/A;       Home Medications    Prior to Admission medications   Medication Sig Start Date End Date Taking? Authorizing Provider  digoxin (LANOXIN) 0.125 MG tablet Take 1 tablet (0.125 mg total) by mouth daily. 03/27/17   Clegg, Amy D, NP  furosemide (LASIX) 40 MG tablet Take 1 tablet (40 mg total) by mouth daily. 03/27/17   Clegg, Amy D, NP  losartan (COZAAR) 25 MG tablet Take 1 tablet (25 mg total) by mouth at bedtime. 03/27/17   Clegg, Amy D, NP  spironolactone (ALDACTONE) 25 MG tablet Take 0.5 tablets (12.5 mg total) by mouth daily. 03/27/17   Sherald Hess, NP    Family History Family History  Problem Relation Age of Onset  . CAD Father     Social History Social History   Tobacco Use  . Smoking status: Former Smoker    Types: Cigars    Last attempt to quit: 11/16/2016    Years since quitting: 0.3  . Smokeless tobacco: Never Used  . Tobacco comment: 6 cigars per wk  Substance Use Topics  . Alcohol use: Yes    Alcohol/week: 8.4 oz    Types: 12 Cans of beer, 2 Shots of liquor per week    Comment: occ  . Drug use: No     Allergies   Bee venom   Review of Systems Review of Systems  All other systems reviewed and are negative.    Physical Exam Updated Vital Signs BP 112/84 (BP Location:  Left Arm)   Pulse (!) 106   Temp 98.4 F (36.9 C) (Oral)   Resp 18   SpO2 97%   Physical Exam  Constitutional: He is oriented to person, place, and time. He appears well-developed and well-nourished.  HENT:  Head: Normocephalic and atraumatic.  Eyes: EOM are normal.  Neck: Normal range of motion.  Cardiovascular: Normal rate, regular rhythm, normal heart sounds and intact distal pulses.  Pulmonary/Chest: Effort normal and breath sounds normal. No respiratory distress.  Abdominal: Soft. He exhibits no distension. There is no tenderness.  Musculoskeletal: Normal range of motion.       Right lower leg: He exhibits edema.       Left lower leg: He exhibits edema.    Neurological: He is alert and oriented to person, place, and time.  Skin: Skin is warm and dry.  Psychiatric: He has a normal mood and affect. Judgment normal.  Nursing note and vitals reviewed.    ED Treatments / Results  Labs (all labs ordered are listed, but only abnormal results are displayed) Labs Reviewed  BASIC METABOLIC PANEL - Abnormal; Notable for the following components:      Result Value   Sodium 131 (*)    CO2 19 (*)    Glucose, Bld 199 (*)    Creatinine, Ser 1.33 (*)    Calcium 8.2 (*)    All other components within normal limits  CBC - Abnormal; Notable for the following components:   RBC 4.21 (*)    Hemoglobin 12.1 (*)    HCT 38.4 (*)    All other components within normal limits  BRAIN NATRIURETIC PEPTIDE - Abnormal; Notable for the following components:   B Natriuretic Peptide 835.4 (*)    All other components within normal limits  I-STAT TROPONIN, ED    EKG  EKG Interpretation  Date/Time:  Sunday March 25 2017 13:52:54 EDT Ventricular Rate:  113 PR Interval:  136 QRS Duration: 88 QT Interval:  360 QTC Calculation: 493 R Axis:   -110 Text Interpretation:  Sinus tachycardia with occasional ventricular-paced complexes and Fusion complexes Right superior axis deviation Anterior infarct , age undetermined Abnormal ECG No significant change was found Confirmed by Ricka Westra ([id number]) on 03/25/2017 4:36:46 PM       Radiology No results found.  Procedures Procedures (including critical care time)  Medications Ordered in ED Medications  furosemide (LASIX) injection 80 mg (80 mg Intravenous Given 03/25/17 1913)     Initial Impression / Assessment and Plan / ED Course  I have reviewed the triage vital signs and the nursing notes.  Pertinent labs & imaging results that were available during my care of the patient were reviewed by me and considered in my medical decision making (see chart for details).     Overall well-appearing.  Patient  diuresed in the emergency department.  Urinated over a liter.  Feels better.  Ambulatory in the ER.  Outpatient follow-up in the heart failure and cardiac clinic.  Patient written prescriptions for his medications.  Dietary choices need to improve.  Compliance with medications is essential.  Patient understands this.  Final Clinical Impressions(s) / ED Diagnoses   Final diagnoses:  Acute on chronic congestive heart failure, unspecified heart failure type (HCC)    ED Discharge Orders        Ordered    digoxin (LANOXIN) 0.125 MG tablet  Daily,   Status:  Discontinued     03 /10/19 2152    insulin aspart (NOVOLOG) 100  UNIT/ML FlexPen  3 times daily with meals,   Status:  Discontinued     03/25/17 2152    insulin glargine (LANTUS) 100 unit/mL SOPN  Daily at bedtime,   Status:  Discontinued     03/25/17 2152    isosorbide mononitrate (IMDUR) 30 MG 24 hr tablet  Daily,   Status:  Discontinued     03/25/17 2152    spironolactone (ALDACTONE) 25 MG tablet  Daily,   Status:  Discontinued     03/25/17 2152    rivaroxaban (XARELTO) 20 MG TABS tablet  Daily with supper,   Status:  Discontinued    Comments:  hf fund   03/25/17 2152    furosemide (LASIX) 40 MG tablet  Daily,   Status:  Discontinued     03/25/17 2152       Azalia Bilis, MD 03/30/17 [phone]

## 2017-04-09 ENCOUNTER — Telehealth (HOSPITAL_COMMUNITY): Payer: Self-pay

## 2017-04-09 NOTE — Telephone Encounter (Signed)
CHF Clinic appointment reminder call placed to patient for upcoming appointment.  No answer, no VM.  Marland KitchenAve Filter

## 2017-04-10 ENCOUNTER — Telehealth: Payer: Self-pay | Admitting: Licensed Clinical Social Worker

## 2017-04-10 ENCOUNTER — Encounter (HOSPITAL_COMMUNITY): Payer: Self-pay

## 2017-04-10 NOTE — Telephone Encounter (Signed)
CSW attempted to contact patient due to no show in the clinic today. CSW unable to reach patient and unable to leave a message. Lasandra Beech, LCSW, CCSW-MCS [phone]

## 2017-04-21 ENCOUNTER — Emergency Department (HOSPITAL_COMMUNITY): Payer: Medicaid Other

## 2017-04-21 ENCOUNTER — Inpatient Hospital Stay (HOSPITAL_COMMUNITY)
Admission: EM | Admit: 2017-04-21 | Discharge: 2017-06-04 | DRG: 001 | Disposition: A | Discharge status: Home Health | Payer: Medicaid Other | Attending: Cardiothoracic Surgery | Admitting: Cardiothoracic Surgery

## 2017-04-21 ENCOUNTER — Encounter (HOSPITAL_COMMUNITY): Payer: Self-pay | Admitting: Emergency Medicine

## 2017-04-21 ENCOUNTER — Other Ambulatory Visit: Payer: Self-pay

## 2017-04-21 ENCOUNTER — Inpatient Hospital Stay (HOSPITAL_COMMUNITY): Payer: Medicaid Other

## 2017-04-21 DIAGNOSIS — I5023 Acute on chronic systolic (congestive) heart failure: Secondary | ICD-10-CM | POA: Diagnosis present

## 2017-04-21 DIAGNOSIS — R509 Fever, unspecified: Secondary | ICD-10-CM | POA: Diagnosis not present

## 2017-04-21 DIAGNOSIS — R0602 Shortness of breath: Secondary | ICD-10-CM

## 2017-04-21 DIAGNOSIS — E8779 Other fluid overload: Secondary | ICD-10-CM

## 2017-04-21 DIAGNOSIS — K029 Dental caries, unspecified: Secondary | ICD-10-CM | POA: Diagnosis present

## 2017-04-21 DIAGNOSIS — M109 Gout, unspecified: Secondary | ICD-10-CM | POA: Diagnosis not present

## 2017-04-21 DIAGNOSIS — M898X0 Other specified disorders of bone, multiple sites: Secondary | ICD-10-CM

## 2017-04-21 DIAGNOSIS — K045 Chronic apical periodontitis: Secondary | ICD-10-CM | POA: Diagnosis present

## 2017-04-21 DIAGNOSIS — E039 Hypothyroidism, unspecified: Secondary | ICD-10-CM | POA: Diagnosis not present

## 2017-04-21 DIAGNOSIS — R5082 Postprocedural fever: Secondary | ICD-10-CM | POA: Diagnosis not present

## 2017-04-21 DIAGNOSIS — N183 Chronic kidney disease, stage 3 unspecified: Secondary | ICD-10-CM | POA: Diagnosis present

## 2017-04-21 DIAGNOSIS — E1122 Type 2 diabetes mellitus with diabetic chronic kidney disease: Secondary | ICD-10-CM | POA: Diagnosis not present

## 2017-04-21 DIAGNOSIS — R042 Hemoptysis: Secondary | ICD-10-CM | POA: Diagnosis not present

## 2017-04-21 DIAGNOSIS — K143 Hypertrophy of tongue papillae: Secondary | ICD-10-CM | POA: Diagnosis not present

## 2017-04-21 DIAGNOSIS — E8770 Fluid overload, unspecified: Secondary | ICD-10-CM | POA: Diagnosis not present

## 2017-04-21 DIAGNOSIS — F1099 Alcohol use, unspecified with unspecified alcohol-induced disorder: Secondary | ICD-10-CM | POA: Diagnosis not present

## 2017-04-21 DIAGNOSIS — R0682 Tachypnea, not elsewhere classified: Secondary | ICD-10-CM | POA: Diagnosis present

## 2017-04-21 DIAGNOSIS — N170 Acute kidney failure with tubular necrosis: Secondary | ICD-10-CM | POA: Diagnosis not present

## 2017-04-21 DIAGNOSIS — Z8249 Family history of ischemic heart disease and other diseases of the circulatory system: Secondary | ICD-10-CM

## 2017-04-21 DIAGNOSIS — L03115 Cellulitis of right lower limb: Secondary | ICD-10-CM | POA: Diagnosis not present

## 2017-04-21 DIAGNOSIS — IMO0002 Reserved for concepts with insufficient information to code with codable children: Secondary | ICD-10-CM | POA: Diagnosis present

## 2017-04-21 DIAGNOSIS — I480 Paroxysmal atrial fibrillation: Secondary | ICD-10-CM | POA: Diagnosis not present

## 2017-04-21 DIAGNOSIS — Z452 Encounter for adjustment and management of vascular access device: Secondary | ICD-10-CM

## 2017-04-21 DIAGNOSIS — I509 Heart failure, unspecified: Secondary | ICD-10-CM | POA: Diagnosis not present

## 2017-04-21 DIAGNOSIS — I609 Nontraumatic subarachnoid hemorrhage, unspecified: Secondary | ICD-10-CM

## 2017-04-21 DIAGNOSIS — I130 Hypertensive heart and chronic kidney disease with heart failure and stage 1 through stage 4 chronic kidney disease, or unspecified chronic kidney disease: Principal | ICD-10-CM | POA: Diagnosis present

## 2017-04-21 DIAGNOSIS — Z515 Encounter for palliative care: Secondary | ICD-10-CM | POA: Diagnosis not present

## 2017-04-21 DIAGNOSIS — E118 Type 2 diabetes mellitus with unspecified complications: Secondary | ICD-10-CM

## 2017-04-21 DIAGNOSIS — I513 Intracardiac thrombosis, not elsewhere classified: Secondary | ICD-10-CM | POA: Diagnosis present

## 2017-04-21 DIAGNOSIS — K06020 Generalized gingival recession, unspecified: Secondary | ICD-10-CM | POA: Diagnosis present

## 2017-04-21 DIAGNOSIS — E1165 Type 2 diabetes mellitus with hyperglycemia: Secondary | ICD-10-CM | POA: Diagnosis present

## 2017-04-21 DIAGNOSIS — K0889 Other specified disorders of teeth and supporting structures: Secondary | ICD-10-CM | POA: Diagnosis present

## 2017-04-21 DIAGNOSIS — D62 Acute posthemorrhagic anemia: Secondary | ICD-10-CM | POA: Diagnosis not present

## 2017-04-21 DIAGNOSIS — I5022 Chronic systolic (congestive) heart failure: Secondary | ICD-10-CM | POA: Diagnosis not present

## 2017-04-21 DIAGNOSIS — Z7189 Other specified counseling: Secondary | ICD-10-CM

## 2017-04-21 DIAGNOSIS — M272 Inflammatory conditions of jaws: Secondary | ICD-10-CM | POA: Diagnosis not present

## 2017-04-21 DIAGNOSIS — I340 Nonrheumatic mitral (valve) insufficiency: Secondary | ICD-10-CM | POA: Diagnosis not present

## 2017-04-21 DIAGNOSIS — Z91030 Bee allergy status: Secondary | ICD-10-CM | POA: Diagnosis not present

## 2017-04-21 DIAGNOSIS — Z9119 Patient's noncompliance with other medical treatment and regimen: Secondary | ICD-10-CM

## 2017-04-21 DIAGNOSIS — Z87891 Personal history of nicotine dependence: Secondary | ICD-10-CM | POA: Diagnosis not present

## 2017-04-21 DIAGNOSIS — Z4509 Encounter for adjustment and management of other cardiac device: Secondary | ICD-10-CM

## 2017-04-21 DIAGNOSIS — K0530 Chronic periodontitis, unspecified: Secondary | ICD-10-CM | POA: Diagnosis present

## 2017-04-21 DIAGNOSIS — I5084 End stage heart failure: Secondary | ICD-10-CM | POA: Diagnosis present

## 2017-04-21 DIAGNOSIS — E872 Acidosis: Secondary | ICD-10-CM | POA: Diagnosis not present

## 2017-04-21 DIAGNOSIS — Z87820 Personal history of traumatic brain injury: Secondary | ICD-10-CM

## 2017-04-21 DIAGNOSIS — Z95811 Presence of heart assist device: Secondary | ICD-10-CM | POA: Diagnosis not present

## 2017-04-21 DIAGNOSIS — M7989 Other specified soft tissue disorders: Secondary | ICD-10-CM

## 2017-04-21 DIAGNOSIS — R570 Cardiogenic shock: Secondary | ICD-10-CM

## 2017-04-21 DIAGNOSIS — K041 Necrosis of pulp: Secondary | ICD-10-CM | POA: Diagnosis present

## 2017-04-21 DIAGNOSIS — I5043 Acute on chronic combined systolic (congestive) and diastolic (congestive) heart failure: Secondary | ICD-10-CM

## 2017-04-21 DIAGNOSIS — Z01818 Encounter for other preprocedural examination: Secondary | ICD-10-CM | POA: Diagnosis not present

## 2017-04-21 DIAGNOSIS — Z01810 Encounter for preprocedural cardiovascular examination: Secondary | ICD-10-CM | POA: Diagnosis not present

## 2017-04-21 DIAGNOSIS — I4892 Unspecified atrial flutter: Secondary | ICD-10-CM | POA: Diagnosis not present

## 2017-04-21 DIAGNOSIS — I240 Acute coronary thrombosis not resulting in myocardial infarction: Secondary | ICD-10-CM | POA: Diagnosis present

## 2017-04-21 DIAGNOSIS — Z98818 Other dental procedure status: Secondary | ICD-10-CM | POA: Diagnosis not present

## 2017-04-21 DIAGNOSIS — R000 Tachycardia, unspecified: Secondary | ICD-10-CM | POA: Diagnosis present

## 2017-04-21 DIAGNOSIS — M264 Malocclusion, unspecified: Secondary | ICD-10-CM | POA: Diagnosis present

## 2017-04-21 DIAGNOSIS — Z599 Problem related to housing and economic circumstances, unspecified: Secondary | ICD-10-CM

## 2017-04-21 DIAGNOSIS — Z9889 Other specified postprocedural states: Secondary | ICD-10-CM

## 2017-04-21 LAB — COMPREHENSIVE METABOLIC PANEL
ALT: 231 U/L — ABNORMAL HIGH (ref 17–63)
AST: <5 U/L — ABNORMAL LOW (ref 15–41)
Albumin: 2.6 g/dL — ABNORMAL LOW (ref 3.5–5.0)
Alkaline Phosphatase: 131 U/L — ABNORMAL HIGH (ref 38–126)
Anion gap: 14 (ref 5–15)
BUN: 42 mg/dL — ABNORMAL HIGH (ref 6–20)
CO2: 22 mmol/L (ref 22–32)
Calcium: 8.6 mg/dL — ABNORMAL LOW (ref 8.9–10.3)
Chloride: 95 mmol/L — ABNORMAL LOW (ref 101–111)
Creatinine, Ser: 1.65 mg/dL — ABNORMAL HIGH (ref 0.61–1.24)
GFR calc Af Amer: 55 mL/min — ABNORMAL LOW (ref >60)
GFR calc non Af Amer: 48 mL/min — ABNORMAL LOW (ref >60)
Glucose, Bld: 252 mg/dL — ABNORMAL HIGH (ref 65–99)
Potassium: 4.2 mmol/L (ref 3.5–5.1)
Sodium: 131 mmol/L — ABNORMAL LOW (ref 135–145)
Total Bilirubin: 1.9 mg/dL — ABNORMAL HIGH (ref 0.3–1.2)
Total Protein: 7.3 g/dL (ref 6.5–8.1)

## 2017-04-21 LAB — RAPID URINE DRUG SCREEN, HOSP PERFORMED
Amphetamines: NOT DETECTED
Barbiturates: NOT DETECTED
Benzodiazepines: NOT DETECTED
Cocaine: NOT DETECTED
Opiates: NOT DETECTED
Tetrahydrocannabinol: NOT DETECTED

## 2017-04-21 LAB — LACTIC ACID, PLASMA: Lactic Acid, Venous: 4.6 mmol/L (ref 0.5–1.9)

## 2017-04-21 LAB — URINALYSIS, ROUTINE W REFLEX MICROSCOPIC
Bacteria, UA: NONE SEEN
Bilirubin Urine: NEGATIVE
Glucose, UA: NEGATIVE mg/dL
Ketones, ur: NEGATIVE mg/dL
Leukocytes, UA: NEGATIVE
Nitrite: NEGATIVE
Protein, ur: 100 mg/dL — AB
Specific Gravity, Urine: 1.011 (ref 1.005–1.030)
pH: 5 (ref 5.0–8.0)

## 2017-04-21 LAB — BRAIN NATRIURETIC PEPTIDE: B Natriuretic Peptide: 483 pg/mL — ABNORMAL HIGH (ref 0.0–100.0)

## 2017-04-21 LAB — CBC WITH DIFFERENTIAL/PLATELET
Basophils Absolute: 0 10*3/uL (ref 0.0–0.1)
Basophils Relative: 0 %
Eosinophils Absolute: 0 10*3/uL (ref 0.0–0.7)
Eosinophils Relative: 0 %
HCT: 44 % (ref 39.0–52.0)
Hemoglobin: 14 g/dL (ref 13.0–17.0)
Lymphocytes Relative: 18 %
Lymphs Abs: 1.6 10*3/uL (ref 0.7–4.0)
MCH: 28.3 pg (ref 26.0–34.0)
MCHC: 31.8 g/dL (ref 30.0–36.0)
MCV: 88.9 fL (ref 78.0–100.0)
Monocytes Absolute: 0.6 10*3/uL (ref 0.1–1.0)
Monocytes Relative: 6 %
Neutro Abs: 6.9 10*3/uL (ref 1.7–7.7)
Neutrophils Relative %: 76 %
Platelets: 168 10*3/uL (ref 150–400)
RBC: 4.95 MIL/uL (ref 4.22–5.81)
RDW: 16.1 % — ABNORMAL HIGH (ref 11.5–15.5)
WBC: 9.1 10*3/uL (ref 4.0–10.5)

## 2017-04-21 LAB — ETHANOL: Alcohol, Ethyl (B): <10 mg/dL (ref <10)

## 2017-04-21 LAB — MRSA PCR SCREENING: MRSA by PCR: NEGATIVE

## 2017-04-21 LAB — DIGOXIN LEVEL: Digoxin Level: 0.4 ng/mL — ABNORMAL LOW (ref 0.8–2.0)

## 2017-04-21 LAB — I-STAT CG4 LACTIC ACID, ED: Lactic Acid, Venous: 3.35 mmol/L (ref 0.5–1.9)

## 2017-04-21 LAB — TROPONIN I
Troponin I: 0.06 ng/mL (ref <0.03)
Troponin I: 0.06 ng/mL (ref <0.03)

## 2017-04-21 MED ORDER — FUROSEMIDE 10 MG/ML IJ SOLN
10.0000 mg/h | INTRAVENOUS | Status: DC
  Administered 2017-04-21: 4 mg/h via INTRAVENOUS
  Administered 2017-04-23 – 2017-04-25 (×3): 10 mg/h via INTRAVENOUS
  Filled 2017-04-21: qty 25
  Filled 2017-04-21 (×3): qty 20
  Filled 2017-04-21 (×2): qty 25

## 2017-04-21 MED ORDER — IOPAMIDOL (ISOVUE-370) INJECTION 76%
100.0000 mL | Freq: Once | INTRAVENOUS | Status: AC | PRN
  Administered 2017-04-21: 100 mL via INTRAVENOUS

## 2017-04-21 MED ORDER — ORAL CARE MOUTH RINSE
15.0000 mL | Freq: Two times a day (BID) | OROMUCOSAL | Status: DC
  Administered 2017-04-22 – 2017-04-24 (×4): 15 mL via OROMUCOSAL

## 2017-04-21 MED ORDER — SODIUM CHLORIDE 0.9 % IV SOLN
1000.0000 mL | INTRAVENOUS | Status: DC
  Administered 2017-04-21: 1000 mL via INTRAVENOUS

## 2017-04-21 MED ORDER — SODIUM CHLORIDE 0.9 % IV SOLN
1.0000 g | Freq: Once | INTRAVENOUS | Status: AC
  Administered 2017-04-21: 1 g via INTRAVENOUS
  Filled 2017-04-21: qty 10

## 2017-04-21 MED ORDER — DIGOXIN 125 MCG PO TABS: 0.1250 mg | ORAL_TABLET | Freq: Every day | ORAL | Status: DC

## 2017-04-21 MED ORDER — ENOXAPARIN SODIUM 40 MG/0.4ML ~~LOC~~ SOLN
40.0000 mg | SUBCUTANEOUS | Status: DC
  Administered 2017-04-21: 40 mg via SUBCUTANEOUS
  Filled 2017-04-21: qty 0.4

## 2017-04-21 MED ORDER — FUROSEMIDE 10 MG/ML IJ SOLN
40.0000 mg | INTRAMUSCULAR | Status: AC
  Administered 2017-04-21: 40 mg via INTRAVENOUS
  Filled 2017-04-21: qty 4

## 2017-04-21 MED ORDER — SPIRONOLACTONE 12.5 MG HALF TABLET
12.5000 mg | ORAL_TABLET | Freq: Every day | ORAL | Status: DC
  Administered 2017-04-22 – 2017-05-14 (×23): 12.5 mg via ORAL
  Filled 2017-04-21 (×26): qty 1

## 2017-04-21 MED ORDER — FUROSEMIDE 10 MG/ML IJ SOLN
80.0000 mg | Freq: Once | INTRAMUSCULAR | Status: AC
  Administered 2017-04-21: 80 mg via INTRAVENOUS
  Filled 2017-04-21: qty 8

## 2017-04-21 MED ORDER — SODIUM CHLORIDE 0.9 % IV SOLN
500.0000 mg | Freq: Once | INTRAVENOUS | Status: AC
  Administered 2017-04-21: 500 mg via INTRAVENOUS
  Filled 2017-04-21: qty 500

## 2017-04-21 MED ORDER — MILRINONE LACTATE IN DEXTROSE 20-5 MG/100ML-% IV SOLN
0.2500 ug/kg/min | INTRAVENOUS | Status: DC
  Administered 2017-04-22 – 2017-04-23 (×4): 0.25 ug/kg/min via INTRAVENOUS
  Filled 2017-04-21 (×5): qty 100

## 2017-04-21 MED ORDER — FUROSEMIDE 10 MG/ML IJ SOLN: 40.0000 mg | Freq: Two times a day (BID) | INTRAMUSCULAR | Status: DC

## 2017-04-21 NOTE — Consult Note (Signed)
CARDIOLOGY CONSULT NOTE   Referring Physician: Dr. Denton Brick Primary Physician: Cassandra Primary Cardiologist: [place] Reason for Consultation: acute on chronic systolic heart failure, evidence of cardiogenic shock   HPI: Mr. Benjamin Sherman is a 47 yo man seen in consult at the request of Dr. Denton Brick for assistance with management of heart failure. He notes that he has gained about 11 lbs in the last week or two. He has been short of breath at baseline for the last 2-3 days and has had difficulty sleeping due to orthopnea for the last week. He notes bilateral lower extremity edema for about a week. He has been taking "four pills a day" since his recent outpatient cardiology visit 03/27/17.   He was seen at [facility], with concern for volume overload vs. Community acquired pneumonia. Received one dose of azithromycin and ceftriaxone, and he was transferred to [facility] for further management.  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 Also found to have LV thrombus at that time, initially placed on coumadin. L/RHC at that admission showed normal coronaries and elevated biventricular pressures. He was treated with IV solumedrol and ordered a long taper of prednisone. At discharge, he had diuresed 20 liters and lost 51 lbs of fluid, with a discharge weight of 206 lbs 11/16/2016.  Unfortunately, there were challenges with medications after discharge. He could not travel for INR checks and was switched to xarelto for LV thrombus. Three weeks after discharge he suffered a loss of consciousness and hit his head, resulting in a subarachnoid hemorrhage. He was given [place] and had his anticoagulation held for three days. As the inciting event was unclear, and he was at high risk for VT, he was fitted for a lifevest prior to discharge.   He was lost to  follow up for some time, but he presented back to [facility] in 03/2017. It was noted that he had not been on heart failure medications since the end of December, and he had not worn the lifevest since discharge. He had bedside echo at that appointment with persistent low EF. He was restarted on lasix 40 mg daily, spironolactone 12.5 mg daily, losartan 25 mg daily, and digoxin 0.125 mg daily. He reports being compliant with these medications for the last several weeks since the appointment.   Review of Systems:     Cardiac Review of Systems: {Y] = yes '[ ]'  = no  Chest Pain [    N]  Resting SOB [  Y ] Exertional SOB  Jazmín.Cullens  ]  Orthopnea [ Y ]   Pedal Edema [ Y  ]    Palpitations Aqua.Slicker  ] Syncope  [ N ]   Presyncope Aqua.Slicker   ]  General Review of Systems: [Y] = yes [  ]=no Constitional: recent weight change [Y  ]; anorexia [  ]; fatigue [Y  ]; nausea [  ]; night sweats [  ]; fever [  ]; or chills [  ];                                                                     Eyes : blurred  vision [  ]; diplopia [   ]; vision changes [  ];  Amaurosis fugax[  ]; Resp: cough [ Y ];  wheezing[  ];  hemoptysis[  ];  PND [ Y ];  GI:  gallstones[  ], vomiting[  ];  dysphagia[  ]; melena[  ];  hematochezia [  ]; heartburn[  ];   GU: kidney stones [  ]; hematuria[  ];   dysuria [  ];  nocturia[  ]; incontinence [  ];             Skin: rash, swelling[  ];, hair loss[  ];  peripheral edema[ Y ];  or itching[  ]; Musculosketetal: myalgias[  ];  joint swelling[  ];  joint erythema[  ];  joint pain[  ];  back pain[  ];  Heme/Lymph: bruising[  ];  bleeding[  ];  anemia[  ];  Neuro: TIA[  ];  headaches[  ];  stroke[  ];  vertigo[  ];  seizures[  ];   paresthesias[  ];  difficulty walking[  ];  Psych:depression[  ]; anxiety[  ];  Endocrine: diabetes[  ];  thyroid dysfunction[  ];  Other:  Past Medical History:  Diagnosis Date  . CHF (congestive heart failure) ([place])   . Diabetes mellitus without complication ([place])      Medications Prior to Admission  Medication Sig Dispense Refill  . digoxin (LANOXIN) 0.125 MG tablet Take 1 tablet (0.125 mg total) by mouth daily. 30 tablet 2  . furosemide (LASIX) 40 MG tablet Take 1 tablet (40 mg total) by mouth daily. 30 tablet 2  . losartan (COZAAR) 25 MG tablet Take 1 tablet (25 mg total) by mouth at bedtime. 30 tablet 2  . spironolactone (ALDACTONE) 25 MG tablet Take 0.5 tablets (12.5 mg total) by mouth daily. 30 tablet 2     . enoxaparin (LOVENOX) injection  40 mg Subcutaneous Q24H  . mouth rinse  15 mL Mouth Rinse BID  . spironolactone  12.5 mg Oral Daily    Infusions:   Allergies  Allergen Reactions  . Bee Venom     Social History   Socioeconomic History  . Marital status: Single    Spouse name: Not on file  . Number of children: Not on file  . Years of education: Not on file  . Highest education level: Not on file  Occupational History  . Occupation: Air cabin crew Needs  . Financial resource strain: Not on file  . Food insecurity:    Worry: Not on file    Inability: Not on file  . Transportation needs:    Medical: Not on file    Non-medical: Not on file  Tobacco Use  . Smoking status: Former Smoker    Types: Cigars    Last attempt to quit: 11/16/2016    Years since quitting: 0.4  . Smokeless tobacco: Never Used  . Tobacco comment: 6 cigars per wk  Substance and Sexual Activity  . Alcohol use: Yes    Alcohol/week: 8.4 oz    Types: 12 Cans of beer, 2 Shots of liquor per week    Comment: occ  . Drug use: No  . Sexual activity: Not on file  Lifestyle  . Physical activity:    Days per week: Not on file    Minutes per session: Not on file  . Stress: Not on file  Relationships  . Social connections:    Talks on phone: Not on file  Gets together: Not on file    Attends religious service: Not on file    Active member of club or organization: Not on file    Attends meetings of clubs or organizations: Not on file     Relationship status: Not on file  . Intimate partner violence:    Fear of current or ex partner: Not on file    Emotionally abused: Not on file    Physically abused: Not on file    Forced sexual activity: Not on file  Other Topics Concern  . Not on file  Social History Narrative  . Not on file    Family History  Problem Relation Age of Onset  . CAD Father     PHYSICAL EXAM: Vitals:   04/21/17 1914 04/21/17 2000  BP: (!) 107/93 (!) 112/92  Pulse: (!) 140 (!) 134  Resp: 18 (!) 22  Temp: 97.7 F (36.5 C)   SpO2: 96% (!) 83%     Intake/Output Summary (Last 24 hours) at 04/22/2017 0025 Last data filed at 04/22/2017 0000 Gross per 24 hour  Intake 10.8 ml  Output -  Net 10.8 ml    General:  Pleasant gentleman, answers questions appropriately, mildly short of breath with prolonged speech HEENT: NCAT, poor dentition Neck: supple. JVD to at least jaw at 60 degrees. Carotids 2+ bilat; no bruits. No lymphadenopathy or thryomegaly appreciated. Cor: PMI laterally displaced, tachycardic but regular, no appreciable murmurs/rubs/gallops Lungs: coarse crackles bilaterally, no wheezing Abdomen: soft, nontender, nondistended. No hepatosplenomegaly. No bruits or masses. Good bowel sounds. Extremities: cold legs bilaterally, bilateral lower extremity edema to at least hips. Clammy hands bilaterally with palpable pulses. Neuro: alert & oriented x 3, cranial nerves grossly intact. moves all 4 extremities w/o difficulty. Affect pleasant.  ECG: sinus tachycardia, left axis deviation  Results for orders placed or performed during the hospital encounter of 04/21/17 (from the past 24 hour(s))  Brain natriuretic peptide     Status: Abnormal   Collection Time: 04/21/17 12:12 PM  Result Value Ref Range   B Natriuretic Peptide 483.0 (H) 0.0 - 100.0 pg/mL  Troponin I     Status: Abnormal   Collection Time: 04/21/17 12:12 PM  Result Value Ref Range   Troponin I 0.06 (HH) <0.03 ng/mL  CBC with  Differential/Platelet     Status: Abnormal   Collection Time: 04/21/17 12:12 PM  Result Value Ref Range   WBC 9.1 4.0 - 10.5 K/uL   RBC 4.95 4.22 - 5.81 MIL/uL   Hemoglobin 14.0 13.0 - 17.0 g/dL   HCT 44.0 39.0 - 52.0 %   MCV 88.9 78.0 - 100.0 fL   MCH 28.3 26.0 - 34.0 pg   MCHC 31.8 30.0 - 36.0 g/dL   RDW 16.1 (H) 11.5 - 15.5 %   Platelets 168 150 - 400 K/uL   Neutrophils Relative % 76 %   Neutro Abs 6.9 1.7 - 7.7 K/uL   Lymphocytes Relative 18 %   Lymphs Abs 1.6 0.7 - 4.0 K/uL   Monocytes Relative 6 %   Monocytes Absolute 0.6 0.1 - 1.0 K/uL   Eosinophils Relative 0 %   Eosinophils Absolute 0.0 0.0 - 0.7 K/uL   Basophils Relative 0 %   Basophils Absolute 0.0 0.0 - 0.1 K/uL  Comprehensive metabolic panel     Status: Abnormal   Collection Time: 04/21/17 12:12 PM  Result Value Ref Range   Sodium 131 (L) 135 - 145 mmol/L   Potassium 4.2 3.5 - 5.1  mmol/L   Chloride 95 (L) 101 - 111 mmol/L   CO2 22 22 - 32 mmol/L   Glucose, Bld 252 (H) 65 - 99 mg/dL   BUN 42 (H) 6 - 20 mg/dL   Creatinine, Ser 1.65 (H) 0.61 - 1.24 mg/dL   Calcium 8.6 (L) 8.9 - 10.3 mg/dL   Total Protein 7.3 6.5 - 8.1 g/dL   Albumin 2.6 (L) 3.5 - 5.0 g/dL   AST <5 (L) 15 - 41 U/L   ALT 231 (H) 17 - 63 U/L   Alkaline Phosphatase 131 (H) 38 - 126 U/L   Total Bilirubin 1.9 (H) 0.3 - 1.2 mg/dL   GFR calc non Af Amer 48 (L) >60 mL/min   GFR calc Af Amer 55 (L) >60 mL/min   Anion gap 14 5 - 15  Ethanol     Status: None   Collection Time: 04/21/17 12:12 PM  Result Value Ref Range   Alcohol, Ethyl (B) <10 <10 mg/dL  Digoxin level     Status: Abnormal   Collection Time: 04/21/17 12:12 PM  Result Value Ref Range   Digoxin Level 0.4 (L) 0.8 - 2.0 ng/mL  Urinalysis, Routine w reflex microscopic     Status: Abnormal   Collection Time: 04/21/17  2:00 PM  Result Value Ref Range   Color, Urine YELLOW YELLOW   APPearance CLEAR CLEAR   Specific Gravity, Urine 1.011 1.005 - 1.030   pH 5.0 5.0 - 8.0   Glucose, UA  NEGATIVE NEGATIVE mg/dL   Hgb urine dipstick SMALL (A) NEGATIVE   Bilirubin Urine NEGATIVE NEGATIVE   Ketones, ur NEGATIVE NEGATIVE mg/dL   Protein, ur 100 (A) NEGATIVE mg/dL   Nitrite NEGATIVE NEGATIVE   Leukocytes, UA NEGATIVE NEGATIVE   RBC / HPF 0-5 0 - 5 RBC/hpf   WBC, UA 0-5 0 - 5 WBC/hpf   Bacteria, UA NONE SEEN NONE SEEN   Squamous Epithelial / LPF 0-5 (A) NONE SEEN   Hyaline Casts, UA PRESENT   Rapid urine drug screen (hospital performed)     Status: None   Collection Time: 04/21/17  2:00 PM  Result Value Ref Range   Opiates NONE DETECTED NONE DETECTED   Cocaine NONE DETECTED NONE DETECTED   Benzodiazepines NONE DETECTED NONE DETECTED   Amphetamines NONE DETECTED NONE DETECTED   Tetrahydrocannabinol NONE DETECTED NONE DETECTED   Barbiturates NONE DETECTED NONE DETECTED  Blood Culture (routine x 2)     Status: None (Preliminary result)   Collection Time: 04/21/17  3:03 PM  Result Value Ref Range   Specimen Description RIGHT ANTECUBITAL    Special Requests      BOTTLES DRAWN AEROBIC AND ANAEROBIC Blood Culture adequate volume Performed at [facility], [address] [place], [place] [address]    Culture PENDING    Report Status PENDING   Blood Culture (routine x 2)     Status: None (Preliminary result)   Collection Time: 04/21/17  3:12 PM  Result Value Ref Range   Specimen Description LEFT ANTECUBITAL    Special Requests      BOTTLES DRAWN AEROBIC AND ANAEROBIC Blood Culture adequate volume Performed at [facility], [address] [place], [place] [address]    Culture PENDING    Report Status PENDING   I-Stat CG4 Lactic Acid, ED  (not at  [facility])     Status: Abnormal   Collection Time: 04/21/17  3:45 PM  Result Value Ref Range   Lactic Acid, Venous 3.35 (HH) 0.5 -  1.9 mmol/L  MRSA PCR Screening     Status: None   Collection Time: 04/21/17  7:10 PM  Result Value Ref Range   MRSA by PCR NEGATIVE NEGATIVE  Troponin I     Status: Abnormal   Collection Time:  04/21/17  8:23 PM  Result Value Ref Range   Troponin I 0.06 (HH) <0.03 ng/mL  Lactic acid, plasma     Status: Abnormal   Collection Time: 04/21/17  8:23 PM  Result Value Ref Range   Lactic Acid, Venous 4.6 (HH) 0.5 - 1.9 mmol/L  Cooxemetry Panel (carboxy, met, total hgb, O2 sat)     Status: None   Collection Time: 04/22/17 12:10 AM  Result Value Ref Range   Total hemoglobin 14.1 12.0 - 16.0 g/dL   O2 Saturation 38.8 %   Carboxyhemoglobin 1.0 0.5 - 1.5 %   Methemoglobin 0.7 0.0 - 1.5 %   Dg Chest 2 View  Result Date: 04/21/2017 CLINICAL DATA:  Shortness of breath and cough EXAM: CHEST - 2 VIEW COMPARISON:  March 25, 2017 FINDINGS: The oval opacity projected over the right lateral lung is similar in the interval, consistent with fluid in the minor fissure. No nodules or suspicious masses. Mild atelectasis in the right base. The heart size is borderline. The hila and mediastinum are normal. No pneumothorax. No other acute abnormalities. No overt edema. IMPRESSION: Stable oval opacity in the right lateral lung, likely fluid in the minor fissure. No other acute abnormalities. Electronically Signed   By: Dorise Bullion III M.D   On: 04/21/2017 13:00   Ct Angio Chest Pe W And/or Wo Contrast  Result Date: 04/21/2017 CLINICAL DATA:  Shortness of breath and history of congestive heart failure. EXAM: CT ANGIOGRAPHY CHEST WITH CONTRAST TECHNIQUE: Multidetector CT imaging of the chest was performed using the standard protocol during bolus administration of intravenous contrast. Multiplanar CT image reconstructions and MIPs were obtained to evaluate the vascular anatomy. CONTRAST:  148m ISOVUE-370 IOPAMIDOL (ISOVUE-370) INJECTION 76% COMPARISON:  Prior chest x-rays including earlier today. FINDINGS: Cardiovascular: Satisfactory opacification of the pulmonary arteries to the segmental level. No evidence of pulmonary embolism. Normal heart size. No pericardial effusion. There is reflux of contrast into the  intrahepatic IVC and hepatic veins consistent with elevated right heart pressures and right heart failure. Mediastinum/Nodes: There are some scattered small, nonenlarged lymph nodes in the mediastinum. No mediastinal masses. Lungs/Pleura: Small amount of right pleural fluid is present with some fluid in the minor fissure and some fluid extending over the apex of the lung. Lungs show no evidence of overt edema, focal airspace consolidation, pneumothorax or nodule. There are scattered areas of scarring and atelectasis in both lungs. Upper Abdomen: No acute abnormality. Musculoskeletal: No chest wall abnormality. No acute or significant osseous findings. Review of the MIP images confirms the above findings. IMPRESSION: 1. No evidence of pulmonary embolism. 2. Small amount of right pleural fluid with some fluid extending into the minor fissure as well as over the right apex. 3. No evidence of right heart failure and elevated right heart pressures with reflux of contrast into the IVC and hepatic veins. Electronically Signed   By: GAletta EdouardM.D.   On: 04/21/2017 15:11   ASSESSMENT: Mr. WHillearyis a 47yo man with PMH NICM who presents in cardiogenic shock.  PLAN/DISCUSSION: Cardiogenic shock: -central venous catheter placed for access, monitoring of CVP/COOX by PCCM team, appreciate assistance -currently maintaining blood pressure, low threshold for arterial line and norepinephrine if needed  to maintain MAPs>65 -monitor for ventricular ectopy -start milrinone 0.25 mcg/kg/min given his renal function -start lasix drip with bolus lasix, monitor electrolytes with goal of K>4/Mg>2. Prior discharge weight 206 lb, but as low as 196 lbs as outpatient. Currently 233 lbs. Goal 1-2 liters negative overnight as BP tolerates. -hold beta blocker given shock. Monitor his tachycardia, likely compensatory at least in part though previously was on ivabradine at initial discharge -hold ACEI/ARB for now, monitor renal  function and blood pressure -will continue spironolactone for now to assist with K and diuresis goals, may need to hold if hypotensive -advanced heart failure team will manage treatment this hospitalization and guide re: long term options for management of his NICM.  NOTE: results returned, initial H[place] prior to start of milrinone was 38. Will recheck COOX and lactate 2 hours after infusions started.  Additional issues: -diabetes: checking A1c, placed on insulin sliding scale -previously had abnormal TFTs, rechecking -suggest social work assistance for medications/insurance/other assistance prior to discharge  [place]. Does not have ICD.  Buford Dresser, MD, PhD, overnight cardiology provider.

## 2017-04-21 NOTE — Procedures (Signed)
Cardiology Consulted for Line Placement Only.    Central Venous Catheter Insertion Procedure Note Benjamin Sherman [id number] [birth year]  Procedure: Insertion of Central Venous Catheter Indications: Assessment of intravascular volume, Drug and/or fluid administration and Frequent blood sampling  Procedure Details Consent: Risks of procedure as well as the alternatives and risks of each were explained to the (patient/caregiver).  Consent for procedure obtained. Time Out: Verified patient identification, verified procedure, site/side was marked, verified correct patient position, special equipment/implants available, medications/allergies/relevent history reviewed, required imaging and test results available.  Performed  Maximum sterile technique was used including antiseptics, cap, gloves, gown, hand hygiene, mask and sheet. Skin prep: Chlorhexidine; local anesthetic administered A antimicrobial bonded/coated triple lumen catheter was placed in the right internal jugular vein using the Seldinger technique.  Evaluation Blood flow good Complications: No apparent complications Patient did tolerate procedure well. Chest X-ray ordered to verify placement.  CXR: pending.  Benjamin Sherman, AGACNP-BC [facility]  Pgr: [phone]  PCCM Pgr: [phone]

## 2017-04-21 NOTE — Progress Notes (Signed)
CRITICAL VALUE ALERT  Critical Value:  Lactic acid 4.6 and troponin 0.06  Date & Time Notied:  04/21/17 @ 2124  Provider Notified:  X. Blount NP  Orders Received/Actions taken: 2125

## 2017-04-21 NOTE — ED Notes (Signed)
Report given to Nicole, RN

## 2017-04-21 NOTE — Progress Notes (Signed)
Patient being transferred to  43m20 report given to [facility]

## 2017-04-21 NOTE — ED Provider Notes (Signed)
[facility] EMERGENCY DEPARTMENT Provider Note   CSN: [id number] Arrival date & time: 04/21/17  1128     History   Chief Complaint Chief Complaint  Patient presents with  . Leg Swelling  . Shortness of Breath    HPI Benjamin Sherman is a 47 y.o. male.  HPI  The patient is a 47 year old male, he has a known history of congestive heart failure and diabetes, he states that he was diagnosed with this back in October 2018.  He does have a history of a pneumothorax on the right, history of congestive heart failure, underwent echocardiogram in October 2018 and had severe diffuse hypokinesis with an ejection fraction of 15%.  Over the following month he underwent 2 other echocardiograms with a slight improvement to 20%.  It does not appear that the patient has followed up with a cardiologist since his discharge in November.  At that time he had a LifeVest fit at that time.  He had been admitted to the hospital after a subarachnoid hemorrhage, he was on Xarelto which was restarted in November 2018 after he was cleared by neurosurgery.  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  The patient states that he presents with a chief complaint of shortness of breath which started 2 days ago, persistent, gradually worsening and is now become severe.  He is unable to sleep because he is severely orthopneic and has noted significant swelling of his bilateral lower extremities.  He denies fevers coughing or chest pain, he has no abdominal pain and no diarrhea.  He has been taking his medications as prescribed.  Past Medical History:  Diagnosis Date  . CHF (congestive heart failure) (HCC)   . Diabetes mellitus without complication [facility])     Patient Active Problem List   Diagnosis Date Noted  . Subarachnoid hemorrhage (HCC)   .  Pneumothorax on right   . Syncope and collapse   . Chronic systolic CHF (congestive heart failure) (HCC)   . Acute thrombus of left ventricle (HCC)   . CKD (chronic kidney disease), stage III (HCC)   . Diabetes mellitus type 2, uncontrolled, with complications (HCC)   . Traumatic subarachnoid hemorrhage with loss of consciousness of 30 minutes or less (HCC)   . Anticoagulated   . Syncope   . Hypotension   . Hyperkalemia   . Traumatic subarachnoid hematoma with loss of consciousness (HCC) 12/06/2016  . Hypothyroid 11/28/2016  . CKD (chronic kidney disease), stage II 11/28/2016  . DM II (diabetes mellitus, type II), controlled (HCC) 11/28/2016  . LV (left ventricular) mural thrombus without MI 11/28/2016  . CHF (congestive heart failure) (HCC) 11/06/2016    Past Surgical History:  Procedure Laterality Date  . RIGHT/LEFT HEART CATH AND CORONARY ANGIOGRAPHY N/A 11/10/2016   Procedure: RIGHT/LEFT HEART CATH AND CORONARY ANGIOGRAPHY;  Surgeon: Dolores Patty, MD;  Location: [facility] INVASIVE CV LAB;  Service: Cardiovascular;  Laterality: N/A;        Home Medications    Prior to Admission medications   Medication Sig Start Date End Date Taking? Authorizing Provider  digoxin (LANOXIN) 0.125 MG tablet Take 1 tablet (0.125 mg total) by mouth daily. 03/27/17   Clegg, Amy D, NP  furosemide (LASIX) 40 MG tablet Take 1 tablet (40 mg total) by mouth daily. 03/27/17   Clegg, Amy D, NP  losartan (COZAAR) 25  MG tablet Take 1 tablet (25 mg total) by mouth at bedtime. 03/27/17   Clegg, Amy D, NP  spironolactone (ALDACTONE) 25 MG tablet Take 0.5 tablets (12.5 mg total) by mouth daily. 03/27/17   Sherald Hess, NP    Family History Family History  Problem Relation Age of Onset  . CAD Father     Social History Social History   Tobacco Use  . Smoking status: Former Smoker    Types: Cigars    Last attempt to quit: 11/16/2016    Years since quitting: 0.4  . Smokeless tobacco: Never Used  .  Tobacco comment: 6 cigars per wk  Substance Use Topics  . Alcohol use: Yes    Alcohol/week: 8.4 oz    Types: 12 Cans of beer, 2 Shots of liquor per week    Comment: occ  . Drug use: No     Allergies   Bee venom   Review of Systems Review of Systems  All other systems reviewed and are negative.    Physical Exam Updated Vital Signs BP (!) 135/105 (BP Location: Left Arm)   Pulse (!) 133   Temp 97.7 F (36.5 C) (Oral)   Resp (!) 29   Ht 5\' 9"  (1.753 m)   Wt 103.4 kg (228 lb)   SpO2 100%   BMI 33.67 kg/m   Physical Exam  Constitutional: He appears well-developed and well-nourished. He appears ill. No distress.  HENT:  Head: Normocephalic and atraumatic.  Mouth/Throat: Oropharynx is clear and moist. No oropharyngeal exudate.  Eyes: Pupils are equal, round, and reactive to light. Conjunctivae and EOM are normal. Right eye exhibits no discharge. Left eye exhibits no discharge. No scleral icterus.  Neck: Normal range of motion. Neck supple. JVD present. No thyromegaly present.  Cardiovascular: Normal rate, regular rhythm, normal heart sounds and intact distal pulses. Exam reveals no gallop and no friction rub.  No murmur heard. JVD is present, tachycardic to 130  Pulmonary/Chest: He is in respiratory distress. He has no wheezes. He has rales in the right lower field and the left lower field. He exhibits no tenderness.  Rales present  Abdominal: Soft. Bowel sounds are normal. He exhibits no distension and no mass. There is no tenderness.  Musculoskeletal: Normal range of motion. He exhibits no tenderness.       Right lower leg: He exhibits edema ( Symmetrical pitting edema bilateral lower extremities).  Lymphadenopathy:    He has no cervical adenopathy.  Neurological: He is alert. Coordination normal.  Skin: Skin is warm and dry. No rash noted. No erythema.  Psychiatric: He has a normal mood and affect. His behavior is normal.  Nursing note and vitals reviewed.    ED  Treatments / Results  Labs (all labs ordered are listed, but only abnormal results are displayed) Labs Reviewed - No data to display  EKG None  Radiology No results found.  Procedures .Critical Care Performed by: Eber Hong, MD Authorized by: Eber Hong, MD   Critical care provider statement:    Critical care time (minutes):  35   Critical care time was exclusive of:  Separately billable procedures and treating other patients and teaching time   Critical care was necessary to treat or prevent imminent or life-threatening deterioration of the following conditions:  Cardiac failure   Critical care was time spent personally by me on the following activities:  Blood draw for specimens, development of treatment plan with patient or surrogate, discussions with consultants, evaluation of patient's  response to treatment, examination of patient, obtaining history from patient or surrogate, ordering and performing treatments and interventions, ordering and review of laboratory studies, ordering and review of radiographic studies, pulse oximetry, re-evaluation of patient's condition and review of old charts   (including critical care time)  Medications Ordered in ED Medications  furosemide (LASIX) injection 40 mg (has no administration in time range)     Initial Impression / Assessment and Plan / ED Course  I have reviewed the triage vital signs and the nursing notes.  Pertinent labs & imaging results that were available during my care of the patient were reviewed by me and considered in my medical decision making (see chart for details).     Pt is very SOB, very tachycardic and has severe depressed EF on prior Echo - is fluid overloaded - having short runs of V tach on monitor - 2-3 beats.  EKG shows tachycardia - abnormal.  Sinus tachycarida  Diurese CXR Consult with cards for severe CHF  BP OK at this time.  Pt appears critically ill with likely overt pulmonary  edema.  Despite Lasix, despite some IV fluids the patient continued to be tachycardic raising the suspicion for possible cardiogenic shock though at this time the patient does not have overt hypotension he does have a narrowed pulse pressure.  The patient's care was discussed with the hospitalist who agrees that the patient needs a high level of care with access to cardiology which we do not have at this facility over the weekend.  The patient will be transferred to a higher level of care, may need ICU admission.  Vitals:   04/21/17 1136 04/21/17 1138  BP:  (!) 135/105  Pulse:  (!) 133  Resp:  (!) 29  Temp:  97.7 F (36.5 C)  TempSrc:  Oral  SpO2:  100%  Weight: 103.4 kg (228 lb)   Height: 5\' 9"  (1.753 m)      Final Clinical Impressions(s) / ED Diagnoses   Final diagnoses:  Cardiogenic shock (HCC)  Acute congestive heart failure, unspecified heart failure type (HCC)      Eber Hong, MD 04/22/17 [phone]

## 2017-04-21 NOTE — ED Notes (Signed)
Patient transferred by [facility]. Leaving now.

## 2017-04-21 NOTE — ED Triage Notes (Signed)
Pt reports he has CHF and for the past 2 days has been having increased fluid buildup with shortness of breath.

## 2017-04-21 NOTE — ED Notes (Signed)
Hospitalist in room with patient. Order to discontinue NS.

## 2017-04-21 NOTE — ED Notes (Signed)
Hospitalist contacted, order for Lasix 40 mg is to be given tomorrow.

## 2017-04-21 NOTE — ED Notes (Signed)
Report given to [facility] RN.  

## 2017-04-21 NOTE — Progress Notes (Signed)
[facility] Admissions paged and Linton Flemings NP called back to page Cardiology consult.  Cardiology paged and Linton Flemings NP paged about lactic acid of 4.6 and troponin 0.06 with a previous page about sustained heart rate 135's with new 12 lead EKG obtained.

## 2017-04-21 NOTE — ED Notes (Signed)
Date and time results received: 04/21/17 1258   Test: Troponin  Critical Value: 0.06  Name of Provider Notified: Dr. Hyacinth Meeker  Orders Received? Or Actions Taken?: No new orders given.

## 2017-04-21 NOTE — H&P (Addendum)
History and Physical    Benjamin Sherman ZOX:[id number] DOB: [birth year] DOA: 04/21/2017  PCP: Patient, No Pcp Per   Patient coming from: Home  Chief Complaint: SOB,   HPI: Benjamin Sherman is a 47 y.o. male with medical history significant for systolic CHF, DM 2, hypothyroidism, traumatic subarachnoid hemorrhage, CKD 2-3.  Patient presented to the [facility] ED with complaints of shortness of breath and bilateral lower extremity swelling of 2 and a 1/2 days duration, also noted orthopnea over 5-6 days duration. Patient endorses a intermittent but chronic unchanged dry cough.  No Fever, no chills.  No chest pain. Patient endorses compliance with his Lasix dose 40 mg once a day.  Patient says he has been off anticoagulation with Xarelto since March, which was started for left ventricular mural wall thrombus.  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  Patient followed up with cardiology patient followed up with cardiology 3/12, Xarelto was discontinued, bedside ultrasound by Dr. Gala Romney showed EF of 20%.  Also noted was patient having having problems with compliance with medication 2/2 financial problems.  PAtient missed subsequent cardiology follow-up  ED Course: Tachycardia heart rates 130s, with tachypnea 20s, systolic blood pressure 110 to 120s.  Patient on room air, O2 sats 100%.  BNP elevated 483, mildly elevated troponin 0 0.06, normal WBC , troponin elevated at 1.6.  Lactic acid was also checked and elevated at 3.35. CTA done to rule out PE, small amount of right pleural fluid.  And given IV Lasix 40 x 2 in the ED, also started on IV azithromycin and ceftriaxone for possible infectious etiology.   Review of Systems: As per HPI otherwise 10 point  review of systems negative.   Past Medical History:  Diagnosis Date  . CHF (congestive heart failure) (HCC)   . Diabetes mellitus without complication [facility])     Past Surgical History:  Procedure Laterality Date  . RIGHT/LEFT HEART CATH AND CORONARY ANGIOGRAPHY N/A 11/10/2016   Procedure: RIGHT/LEFT HEART CATH AND CORONARY ANGIOGRAPHY;  Surgeon: Dolores Patty, MD;  Location: [facility] INVASIVE CV LAB;  Service: Cardiovascular;  Laterality: N/A;     reports that he quit smoking about 5 months ago. His smoking use included cigars. He has never used smokeless tobacco. He reports that he drinks about 8.4 oz of alcohol per week. He reports that he does not use drugs.  Allergies  Allergen Reactions  . Bee Venom     Family History  Problem Relation Age of Onset  . CAD Father     Prior to Admission medications   Medication Sig Start Date End Date Taking? Authorizing Provider  digoxin (LANOXIN) 0.125 MG tablet Take 1 tablet (0.125 mg total) by mouth daily. 03/27/17  Yes Clegg, Amy D, NP  furosemide (LASIX) 40 MG tablet Take 1 tablet (40 mg total) by mouth daily. 03/27/17  Yes Clegg, Amy D, NP  losartan (COZAAR) 25 MG tablet Take 1 tablet (25 mg total) by mouth at bedtime. 03/27/17  Yes Clegg, Amy D, NP  spironolactone (ALDACTONE) 25 MG tablet Take 0.5 tablets (12.5 mg total) by mouth daily. 03/27/17  Yes Clegg, Amy D, NP    Physical Exam: Vitals:   04/21/17 1430 04/21/17 1500 04/21/17 1515 04/21/17 1519  BP: 128/85  Marland Kitchen)  111/95   Pulse:  67 (!) 135 (!) 136  Resp: (!) 21 (!) 28 (!) 24 (!) 27  Temp:    97.9 F (36.6 C)  TempSrc:    Oral  SpO2:  100% 100% 100%  Weight:      Height:        Constitutional: NAD, calm, comfortable Vitals:   04/21/17 1430 04/21/17 1500 04/21/17 1515 04/21/17 1519  BP: 128/85  (!) 111/95   Pulse:  67 (!) 135 (!) 136  Resp: (!) 21 (!) 28 (!) 24 (!) 27  Temp:    97.9 F (36.6 C)  TempSrc:    Oral  SpO2:  100% 100% 100%  Weight:      Height:        Eyes: PERRL, lids and conjunctivae normal ENMT: Mucous membranes are moist. Posterior pharynx clear of any exudate or lesions.Normal dentition.  Neck: normal, supple, no masses, no thyromegaly Respiratory: clear to auscultation bilaterally, no wheezing, no crackles. Normal respiratory effort. No accessory muscle use.  Cardiovascular: Tachycardia but regular rate and rhythm, no murmurs / rubs / gallops. 1 to 2+ bilateral pitting pedal edema R>L to mid leg. 2+ pedal pulses.  Abdomen: no tenderness, no masses palpated. No hepatosplenomegaly. Bowel sounds positive.  Musculoskeletal: no clubbing / cyanosis. No joint deformity upper and lower extremities. Good ROM, no contractures. Normal muscle tone.  Skin: no rashes, lesions, ulcers. No induration Neurologic: CN 2-12 grossly intact. Strength 5/5 in all 4.  Psychiatric: Normal judgment and insight. Alert and oriented x 3. Normal mood.   Labs on Admission: I have personally reviewed following labs and imaging studies  CBC: Recent Labs  Lab 04/21/17 1212  WBC 9.1  NEUTROABS 6.9  HGB 14.0  HCT 44.0  MCV 88.9  PLT 168   Basic Metabolic Panel: Recent Labs  Lab 04/21/17 1212  NA 131*  K 4.2  CL 95*  CO2 22  GLUCOSE 252*  BUN 42*  CREATININE 1.65*  CALCIUM 8.6*   Liver Function Tests: Recent Labs  Lab 04/21/17 1212  AST <5*  ALT 231*  ALKPHOS 131*  BILITOT 1.9*  PROT 7.3  ALBUMIN 2.6*   Cardiac Enzymes: Recent Labs  Lab 04/21/17 1212  TROPONINI 0.06*   Urine analysis:    Component Value Date/Time   COLORURINE YELLOW 04/21/2017 1400   APPEARANCEUR CLEAR 04/21/2017 1400   LABSPEC 1.011 04/21/2017 1400   PHURINE 5.0 04/21/2017 1400   GLUCOSEU NEGATIVE 04/21/2017 1400   HGBUR SMALL (A) 04/21/2017 1400   BILIRUBINUR NEGATIVE 04/21/2017 1400   KETONESUR NEGATIVE 04/21/2017 1400   PROTEINUR 100 (A) 04/21/2017 1400   NITRITE NEGATIVE 04/21/2017 1400   LEUKOCYTESUR NEGATIVE 04/21/2017 1400    Radiological Exams on  Admission: Dg Chest 2 View  Result Date: 04/21/2017 CLINICAL DATA:  Shortness of breath and cough EXAM: CHEST - 2 VIEW COMPARISON:  March 25, 2017 FINDINGS: The oval opacity projected over the right lateral lung is similar in the interval, consistent with fluid in the minor fissure. No nodules or suspicious masses. Mild atelectasis in the right base. The heart size is borderline. The hila and mediastinum are normal. No pneumothorax. No other acute abnormalities. No overt edema. IMPRESSION: Stable oval opacity in the right lateral lung, likely fluid in the minor fissure. No other acute abnormalities. Electronically Signed   By: Gerome Sam III M.D   On: 04/21/2017 13:00   Ct Angio Chest Pe W And/or Wo Contrast  Result Date: 04/21/2017 CLINICAL DATA:  Shortness of breath and history of congestive heart failure. EXAM: CT ANGIOGRAPHY CHEST WITH CONTRAST TECHNIQUE: Multidetector CT imaging of the chest was performed using the standard protocol during bolus administration of intravenous contrast. Multiplanar CT image reconstructions and MIPs were obtained to evaluate the vascular anatomy. CONTRAST:  ISOVUE-370 IOPAMIDOL (ISOVUE-370) INJECTION 76% COMPARISON:  Prior chest x-rays including earlier today. FINDINGS: Cardiovascular: Satisfactory opacification of the pulmonary arteries to the segmental level. No evidence of pulmonary embolism. Normal heart size. No pericardial effusion. There is reflux of contrast into the intrahepatic IVC and hepatic veins consistent with elevated right heart pressures and right heart failure. Mediastinum/Nodes: There are some scattered small, nonenlarged lymph nodes in the mediastinum. No mediastinal masses. Lungs/Pleura: Small amount of right pleural fluid is present with some fluid in the minor fissure and some fluid extending over the apex of the lung. Lungs show no evidence of overt edema, focal airspace consolidation, pneumothorax or nodule. There are scattered areas of  scarring and atelectasis in both lungs. Upper Abdomen: No acute abnormality. Musculoskeletal: No chest wall abnormality. No acute or significant osseous findings. Review of the MIP images confirms the above findings. IMPRESSION: 1. No evidence of pulmonary embolism. 2. Small amount of right pleural fluid with some fluid extending into the minor fissure as well as over the right apex. 3. No evidence of right heart failure and elevated right heart pressures with reflux of contrast into the IVC and hepatic veins. Electronically Signed   By: Irish Lack M.D.   On: 04/21/2017 15:11    EKG: Independently reviewed. Sinus Tachycardia. Prolonged QTc 495  Assessment/Plan Active Problems:   LV (left ventricular) mural thrombus without MI   Subarachnoid hemorrhage (HCC)   Chronic systolic CHF (congestive heart failure) (HCC)   CKD (chronic kidney disease), stage III (HCC)   Diabetes mellitus type 2, uncontrolled, with complications (HCC)   Fluid overload  Fluid overload secondary to right heart failure-history of systolic CHF, last echo Ef 20% 12/08/16.  WBC 9.1. Weight- 228, up from 197 pounds discharge weight 11/28.  chest CTA -small amount of right pleural fluid, evidence of right heart failure and elevated right heart pressure.  BNP 483, elevations 800-900 in the recent past for CHF.  Reports compliance with 40 mg daily of Lasix. IV Lasix 40 x 2 given in ED today.  -Admit to [facility] stepdown -IV Lasix 40 twice daily started in a.m. -Consulted Cardiology, Spoke to Fellow On Call- Dr. Cristal Deer , will see pt on arrival to [facility] -Right lower extremity Doppler for edema worse on right -Daily BMP -Daily weights, strict input-output, fluid restriction -Continue home spironolactone -Hold home losartan for now with mild elevated creatinine, with diuresis -Hold further antibiotics Started in ED ceftriaxone and azithro, presentation does not suggest infectious etiology at this time. -Need for Repeat  Echo, as Pulm pressures not significantly elevated previously  Troponin Elevation- 0.06. Likely troponin leak from cardiomyopathy. Mostly chronically elevated. EKG unchanged from prior. 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  Pt was placed on chronic steroid taper.  Left and right Cardiac cath 11/10/16-PA pressure 49/23.  Bedside Echo in clinic 3/12 Ef ~20%. -Continue spironolactone and digoxin, hold losartan -Patient was discharged home on LifeVest, as per clinic notes patient was not compliant -History of noncompliance secondary to financial difficulties  Left ventricular wall thrombus-patient was on Xarelto, sustained subarachnoid hemorrhage secondary to fall 11/21 , temporarily held and then resumed..  Per Clinic notes 03/27/17  follow-up with cardiology Xarelto was discontinued. -Follow-up cardiology recommendations.  Mild AKI-1.6, likely related to fluid overload.  Baseline 1.1-1.3. -BMP daily -Diuresis -Hold Losartan.  Elevated liver enzymes-ALP- 131, ALT- 231, AST <5. Possibly 2/2 congestive hepatopathy, and hence causing a type B lactic acidosis. No signifcant alcohol hx- occasional 2 beers weekends. No hx of liver disease. - Acute hepatitis panel.  Lactic acidosis- 3.35. No history or symptoms or imaging to suggest infectious etiology. No history of liver disease.  But liver enzymes elevated, likely secondary to congestive hepatopathy from RHF with subsequent typeB Lactic acidosis from liver congestion.  Pt appears volume overloaded and requires diuresis. -Follow-up ED blood cultures drawn   DVT prophylaxis: Lovenox Code Status: full Family Communication: None at bedside Disposition Plan: >2 days  Consults called: Cardiology Admission status: inpt, step down   Onnie Boer MD [facility] Pager [phone] From 3PM-11PM.  Otherwise please contact  night-coverage [email or web address] Password [email or web address]  04/21/2017, 4:08 PM

## 2017-04-21 NOTE — ED Notes (Signed)
Blood cultures drawn by lab

## 2017-04-22 ENCOUNTER — Inpatient Hospital Stay (HOSPITAL_COMMUNITY): Payer: Medicaid Other

## 2017-04-22 DIAGNOSIS — I4892 Unspecified atrial flutter: Secondary | ICD-10-CM

## 2017-04-22 LAB — GLUCOSE, CAPILLARY
Glucose-Capillary: 173 mg/dL — ABNORMAL HIGH (ref 65–99)
Glucose-Capillary: 126 mg/dL — ABNORMAL HIGH (ref 65–99)
Glucose-Capillary: 149 mg/dL — ABNORMAL HIGH (ref 65–99)
Glucose-Capillary: 201 mg/dL — ABNORMAL HIGH (ref 65–99)
Glucose-Capillary: 211 mg/dL — ABNORMAL HIGH (ref 65–99)

## 2017-04-22 LAB — HEMOGLOBIN A1C
Hgb A1c MFr Bld: 7.5 % — ABNORMAL HIGH (ref 4.8–5.6)
Mean Plasma Glucose: 168.55 mg/dL

## 2017-04-22 LAB — COOXEMETRY PANEL
Carboxyhemoglobin: 0.8 % (ref 0.5–1.5)
Carboxyhemoglobin: 0.9 % (ref 0.5–1.5)
Carboxyhemoglobin: 1 % (ref 0.5–1.5)
Methemoglobin: 0.7 % (ref 0.0–1.5)
Methemoglobin: 1.3 % (ref 0.0–1.5)
Methemoglobin: 1.4 % (ref 0.0–1.5)
O2 Saturation: 38.8 %
O2 Saturation: 48.7 %
O2 Saturation: 52 %
Total hemoglobin: 13.2 g/dL (ref 12.0–16.0)
Total hemoglobin: 13.3 g/dL (ref 12.0–16.0)
Total hemoglobin: 14.1 g/dL (ref 12.0–16.0)

## 2017-04-22 LAB — MAGNESIUM: Magnesium: 2.1 mg/dL (ref 1.7–2.4)

## 2017-04-22 LAB — TROPONIN I: Troponin I: 0.06 ng/mL (ref <0.03)

## 2017-04-22 LAB — TSH: TSH: 2.769 u[IU]/mL (ref 0.350–4.500)

## 2017-04-22 LAB — BASIC METABOLIC PANEL
Anion gap: 13 (ref 5–15)
BUN: 40 mg/dL — ABNORMAL HIGH (ref 6–20)
CO2: 22 mmol/L (ref 22–32)
Calcium: 8.5 mg/dL — ABNORMAL LOW (ref 8.9–10.3)
Chloride: 96 mmol/L — ABNORMAL LOW (ref 101–111)
Creatinine, Ser: 1.77 mg/dL — ABNORMAL HIGH (ref 0.61–1.24)
GFR calc Af Amer: 51 mL/min — ABNORMAL LOW (ref >60)
GFR calc non Af Amer: 44 mL/min — ABNORMAL LOW (ref >60)
Glucose, Bld: 173 mg/dL — ABNORMAL HIGH (ref 65–99)
Potassium: 4.2 mmol/L (ref 3.5–5.1)
Sodium: 131 mmol/L — ABNORMAL LOW (ref 135–145)

## 2017-04-22 LAB — T4, FREE: Free T4: 1.24 ng/dL — ABNORMAL HIGH (ref 0.61–1.12)

## 2017-04-22 LAB — LACTIC ACID, PLASMA: Lactic Acid, Venous: 2.3 mmol/L (ref 0.5–1.9)

## 2017-04-22 LAB — HEPARIN LEVEL (UNFRACTIONATED): Heparin Unfractionated: <0.1 IU/mL — ABNORMAL LOW (ref 0.30–0.70)

## 2017-04-22 MED ORDER — INSULIN ASPART 100 UNIT/ML ~~LOC~~ SOLN
0.0000 [IU] | Freq: Three times a day (TID) | SUBCUTANEOUS | Status: DC
  Administered 2017-04-22: 2 [IU] via SUBCUTANEOUS
  Administered 2017-04-22: 5 [IU] via SUBCUTANEOUS
  Administered 2017-04-22 – 2017-04-23 (×2): 2 [IU] via SUBCUTANEOUS
  Administered 2017-04-23: 5 [IU] via SUBCUTANEOUS
  Administered 2017-04-23: 8 [IU] via SUBCUTANEOUS
  Administered 2017-04-24 (×2): 3 [IU] via SUBCUTANEOUS
  Administered 2017-04-24: 5 [IU] via SUBCUTANEOUS
  Administered 2017-04-25: 2 [IU] via SUBCUTANEOUS
  Administered 2017-04-26 (×3): 3 [IU] via SUBCUTANEOUS
  Administered 2017-04-27: 2 [IU] via SUBCUTANEOUS
  Administered 2017-04-27: 3 [IU] via SUBCUTANEOUS
  Administered 2017-04-27: 5 [IU] via SUBCUTANEOUS
  Administered 2017-04-28: 3 [IU] via SUBCUTANEOUS
  Administered 2017-04-28: 2 [IU] via SUBCUTANEOUS
  Administered 2017-04-28 – 2017-04-29 (×2): 3 [IU] via SUBCUTANEOUS
  Administered 2017-04-29: 2 [IU] via SUBCUTANEOUS
  Administered 2017-04-29 – 2017-04-30 (×2): 5 [IU] via SUBCUTANEOUS
  Administered 2017-05-01 – 2017-05-02 (×2): 3 [IU] via SUBCUTANEOUS
  Administered 2017-05-03 – 2017-05-04 (×5): 2 [IU] via SUBCUTANEOUS
  Administered 2017-05-05: 3 [IU] via SUBCUTANEOUS
  Administered 2017-05-06 – 2017-05-08 (×5): 2 [IU] via SUBCUTANEOUS
  Administered 2017-05-09: 3 [IU] via SUBCUTANEOUS
  Administered 2017-05-09: 2 [IU] via SUBCUTANEOUS
  Administered 2017-05-10 – 2017-05-11 (×3): 3 [IU] via SUBCUTANEOUS
  Administered 2017-05-11: 2 [IU] via SUBCUTANEOUS
  Administered 2017-05-12 – 2017-05-13 (×2): 3 [IU] via SUBCUTANEOUS
  Administered 2017-05-13: 2 [IU] via SUBCUTANEOUS
  Administered 2017-05-13: 5 [IU] via SUBCUTANEOUS
  Administered 2017-05-14: 3 [IU] via SUBCUTANEOUS

## 2017-04-22 MED ORDER — INSULIN ASPART 100 UNIT/ML ~~LOC~~ SOLN
0.0000 [IU] | Freq: Every day | SUBCUTANEOUS | Status: DC
  Administered 2017-04-22 – 2017-05-10 (×5): 2 [IU] via SUBCUTANEOUS

## 2017-04-22 MED ORDER — HEPARIN (PORCINE) IN NACL 100-0.45 UNIT/ML-% IJ SOLN
1300.0000 [IU]/h | INTRAMUSCULAR | Status: DC
  Administered 2017-04-22: 1100 [IU]/h via INTRAVENOUS
  Administered 2017-04-23 – 2017-04-30 (×10): 1400 [IU]/h via INTRAVENOUS
  Filled 2017-04-22 (×12): qty 250

## 2017-04-22 MED ORDER — AMIODARONE LOAD VIA INFUSION
150.0000 mg | Freq: Once | INTRAVENOUS | Status: AC
  Administered 2017-04-22: 150 mg via INTRAVENOUS
  Filled 2017-04-22: qty 83.34

## 2017-04-22 MED ORDER — AMIODARONE HCL IN DEXTROSE 360-4.14 MG/200ML-% IV SOLN
30.0000 mg/h | INTRAVENOUS | Status: DC
  Administered 2017-04-22 – 2017-05-08 (×30): 30 mg/h via INTRAVENOUS
  Filled 2017-04-22 (×38): qty 200

## 2017-04-22 MED ORDER — AMIODARONE HCL IN DEXTROSE 360-4.14 MG/200ML-% IV SOLN
60.0000 mg/h | INTRAVENOUS | Status: AC
  Administered 2017-04-22 (×2): 60 mg/h via INTRAVENOUS
  Filled 2017-04-22 (×2): qty 200

## 2017-04-22 NOTE — Progress Notes (Signed)
ANTICOAGULATION CONSULT NOTE - Initial Consult  Pharmacy Consult for Heparin Indication: atrial fibrillation  Allergies  Allergen Reactions  . Bee Venom     Patient Measurements: Height: 6' (182.9 cm) Weight: 228 lb 9.9 oz (103.7 kg) IBW/kg (Calculated) : 77.6   Vital Signs: Temp: 97.6 F (36.4 C) (04/07 1239) Temp Source: Oral (04/07 1239) BP: 106/73 (04/07 1500) Pulse Rate: 144 (04/07 1500)  Labs: Recent Labs    04/21/17 1212 04/21/17 2023 04/22/17 0106  HGB 14.0  --   --   HCT 44.0  --   --   PLT 168  --   --   CREATININE 1.65*  --  1.77*  TROPONINI 0.06* 0.06* 0.06*    Estimated Creatinine Clearance: 63.5 mL/min (A) (by C-G formula based on SCr of 1.77 mg/dL (H)).   Medical History: Past Medical History:  Diagnosis Date  . CHF (congestive heart failure) (HCC)   . Diabetes mellitus without complication (HCC)       Assessment: 46yom with HF admitted in cardiogenic shock and volume overload started on milrinone 0.35mcg/kg/min and furosemide drip.   Afib RVR start amiodarone drip for rate control and heparin drip.   NO AC as outpatient - previously on rivaroxaban but fell hit head developed [facility] 11/18 last head CT no increase hemorrhage.  CBC stable.   Goal of Therapy:  Heparin level 0.3-0.7 units/ml Monitor platelets by anticoagulation protocol: Yes   Plan:  No bolus given [facility] 11/18 Heparin drip 1100 uts/hr Draw HL in 6hr from start Daily HL, CBC Monitor s/s bleeding  Leota Sauers Pharm.D. CPP, BCPS Clinical Pharmacist [phone] 04/22/2017 3:35 PM

## 2017-04-22 NOTE — Progress Notes (Signed)
Advanced Heart Failure Rounding Note   Subjective:    Admitted overnight with cardiogenic shock and atrial flutter.   Central line placed. Initial co-ox 39%. Now on milrinone 0.25. Co-ox up to 52%  Felling much better. CVP 14. Denies orthopnea, PND, CP or palpitations    Creatinine 1.33 -> 1.65 -> 1.77  Objective:   Weight Range:  Vital Signs:   Temp:  [97.6 F (36.4 C)-97.8 F (36.6 C)] 97.6 F (36.4 C) (04/07 1239) Pulse Rate:  [66-145] 144 (04/07 1500) Resp:  [11-29] 24 (04/07 1500) BP: (103-128)/(71-109) 106/73 (04/07 1500) SpO2:  [80 %-100 %] 100 % (04/07 1500) Weight:  [103.7 kg (228 lb 9.9 oz)-105.9 kg (233 lb 7.5 oz)] 103.7 kg (228 lb 9.9 oz) (04/07 0423) Last BM Date: 04/21/17  Weight change: Filed Weights   04/21/17 1136 04/21/17 1908 04/22/17 0423  Weight: 103.4 kg (228 lb) 105.9 kg (233 lb 7.5 oz) 103.7 kg (228 lb 9.9 oz)    Intake/Output:   Intake/Output Summary (Last 24 hours) at 04/22/2017 1530 Last data filed at 04/22/2017 1200 Gross per 24 hour  Intake 1263.6 ml  Output 1725 ml  Net -461.4 ml     Physical Exam: General:  Lying in bed. No resp difficulty HEENT: normal Neck: supple. JVP to ear . Carotids 2+ bilat; no bruits. No lymphadenopathy or thryomegaly appreciated. Cor: PMI laterally displaced. Tachy irregular +s3  2/6 MR Lungs: crackles in bases Abdomen: soft, nontender, nondistended. No hepatosplenomegaly. No bruits or masses. Good bowel sounds. Extremities: no cyanosis, clubbing, rash, 1-2+ edema Neuro: alert & orientedx3, cranial nerves grossly intact. moves all 4 extremities w/o difficulty. Affect pleasant  Telemetry:  AFL 120-130 Personally reviewed   Labs: Basic Metabolic Panel: Recent Labs  Lab 04/21/17 1212 04/22/17 0106  NA 131* 131*  K 4.2 4.2  CL 95* 96*  CO2 22 22  GLUCOSE 252* 173*  BUN 42* 40*  CREATININE 1.65* 1.77*  CALCIUM 8.6* 8.5*  MG  --  2.1    Liver Function Tests: Recent Labs  Lab 04/21/17 1212   AST <5*  ALT 231*  ALKPHOS 131*  BILITOT 1.9*  PROT 7.3  ALBUMIN 2.6*   No results for input(s): LIPASE, AMYLASE in the last 168 hours. No results for input(s): AMMONIA in the last 168 hours.  CBC: Recent Labs  Lab 04/21/17 1212  WBC 9.1  NEUTROABS 6.9  HGB 14.0  HCT 44.0  MCV 88.9  PLT 168    Cardiac Enzymes: Recent Labs  Lab 04/21/17 1212 04/21/17 2023 04/22/17 0106  TROPONINI 0.06* 0.06* 0.06*    BNP: BNP (last 3 results) Recent Labs    11/06/16 1621 03/25/17 1401 04/21/17 1212  BNP 961.6* 835.4* 483.0*    ProBNP (last 3 results) No results for input(s): PROBNP in the last 8760 hours.    Other results:  Imaging: Dg Chest 2 View  Result Date: 04/21/2017 CLINICAL DATA:  Shortness of breath and cough EXAM: CHEST - 2 VIEW COMPARISON:  March 25, 2017 FINDINGS: The oval opacity projected over the right lateral lung is similar in the interval, consistent with fluid in the minor fissure. No nodules or suspicious masses. Mild atelectasis in the right base. The heart size is borderline. The hila and mediastinum are normal. No pneumothorax. No other acute abnormalities. No overt edema. IMPRESSION: Stable oval opacity in the right lateral lung, likely fluid in the minor fissure. No other acute abnormalities. Electronically Signed   By: Gerome Sam III M.D  On: 04/21/2017 13:00   Ct Angio Chest Pe W And/or Wo Contrast  Result Date: 04/21/2017 CLINICAL DATA:  Shortness of breath and history of congestive heart failure. EXAM: CT ANGIOGRAPHY CHEST WITH CONTRAST TECHNIQUE: Multidetector CT imaging of the chest was performed using the standard protocol during bolus administration of intravenous contrast. Multiplanar CT image reconstructions and MIPs were obtained to evaluate the vascular anatomy. CONTRAST:  ISOVUE-370 IOPAMIDOL (ISOVUE-370) INJECTION 76% COMPARISON:  Prior chest x-rays including earlier today. FINDINGS: Cardiovascular: Satisfactory opacification of  the pulmonary arteries to the segmental level. No evidence of pulmonary embolism. Normal heart size. No pericardial effusion. There is reflux of contrast into the intrahepatic IVC and hepatic veins consistent with elevated right heart pressures and right heart failure. Mediastinum/Nodes: There are some scattered small, nonenlarged lymph nodes in the mediastinum. No mediastinal masses. Lungs/Pleura: Small amount of right pleural fluid is present with some fluid in the minor fissure and some fluid extending over the apex of the lung. Lungs show no evidence of overt edema, focal airspace consolidation, pneumothorax or nodule. There are scattered areas of scarring and atelectasis in both lungs. Upper Abdomen: No acute abnormality. Musculoskeletal: No chest wall abnormality. No acute or significant osseous findings. Review of the MIP images confirms the above findings. IMPRESSION: 1. No evidence of pulmonary embolism. 2. Small amount of right pleural fluid with some fluid extending into the minor fissure as well as over the right apex. 3. No evidence of right heart failure and elevated right heart pressures with reflux of contrast into the IVC and hepatic veins. Electronically Signed   By: Irish Lack M.D.   On: 04/21/2017 15:11   Dg Chest Portable 1 View  Result Date: 04/21/2017 CLINICAL DATA:  47 y/o  M; central line placement. EXAM: PORTABLE CHEST 1 VIEW COMPARISON:  04/21/2017 CT of the chest. FINDINGS: Stable cardiac silhouette given projection and technique. Right central venous catheter tip projects over upper SVC. Stable moderate right-sided pleural effusion. No pneumothorax. Hazy opacification of right lung likely due to effusion. Bones are unremarkable. IMPRESSION: Right central venous catheter tip projects over upper SVC. No pneumothorax. Stable right pleural effusion. Electronically Signed   By: Mitzi Hansen M.D.   On: 04/21/2017 23:57      Medications:     Scheduled Medications: .  enoxaparin (LOVENOX) injection  40 mg Subcutaneous Q24H  . insulin aspart  0-15 Units Subcutaneous TID WC  . insulin aspart  0-5 Units Subcutaneous QHS  . mouth rinse  15 mL Mouth Rinse BID  . spironolactone  12.5 mg Oral Daily     Infusions: . furosemide (LASIX) infusion 4 mg/hr (04/22/17 1200)  . milrinone 0.25 mcg/kg/min (04/22/17 1200)     PRN Medications:     Assessment:   Benjamin Sherman is a 47 y.o. 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 eosinophilic cardiomyopathy, LV thrombus, hypothyroidism, DM2, CKD Stage II-III and traumatic [facility] 11/18 after fall.    Plan/Discussion:    1. Acute on chronic systolic HF -> cardiogenic shock  - ECHO 11/2016 EF 25-30% CMRI EF 22%. Bedside echo in HF clinic EF 20% in 3/19 - ? 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 Co-ox 53%. Will continue - Increase lasix gtt to 10/hr. Continue spiro - Will add  amio for AFL for now. Can consider digoxin - No ACE/ARB/b-blocker with shock - Repeat echo.  - Will need TEE/DC-CV for AFL - Overall will likely need advanced therapies but has struggled with noncompliance and lack of family support. Willneed SW eval  2. Atrial flutter - new onset - start amio and heparin - will need TEE/DC-CV  3. AKI CKD Stage II-III - likely ATN. Should improve with hemodynamic support   4. H/o LV Thrombus  - off Xarelto due to previous SAH  5. DMII - cover with SSI  6. Hypothyroidism - check TFTs  CRITICAL CARE Performed by: [name]  Total critical care time: 35 minutes  Critical care time was exclusive of separately billable procedures and treating other patients.  Critical care was necessary to treat or prevent imminent or life-threatening  deterioration.  Critical care was time spent personally by me (independent of midlevel providers or residents) on the following activities: development of treatment plan with patient and/or surrogate as well as nursing, discussions with consultants, evaluation of patient's response to treatment, examination of patient, obtaining history from patient or surrogate, ordering and performing treatments and interventions, ordering and review of laboratory studies, ordering and review of radiographic studies, pulse oximetry and re-evaluation of patient's condition.    Length of Stay: 1   [name]  MD 04/22/2017, 3:30 PM  Advanced Heart Failure Team Pager [phone] (M-F; 7a - 4p)  Please contact [facility] for night-coverage after hours (4p -7a ) and weekends on [email or web address]

## 2017-04-22 NOTE — Plan of Care (Signed)
  Problem: Education: Goal: Knowledge of General Education information will improve Outcome: Progressing   

## 2017-04-22 NOTE — Progress Notes (Signed)
The patient's heart rate is too high to perform the echo at this time (144bpm).

## 2017-04-22 NOTE — Progress Notes (Signed)
CRITICAL VALUE ALERT  Critical Value:  Lactic acid-2.3  Date & Time Notied:  04/22/2017 @ 0415  Provider Notified: Dr. Cristal Deer  Orders Received/Actions taken:

## 2017-04-23 ENCOUNTER — Inpatient Hospital Stay (HOSPITAL_COMMUNITY): Payer: Medicaid Other

## 2017-04-23 DIAGNOSIS — I513 Intracardiac thrombosis, not elsewhere classified: Secondary | ICD-10-CM

## 2017-04-23 DIAGNOSIS — I340 Nonrheumatic mitral (valve) insufficiency: Secondary | ICD-10-CM

## 2017-04-23 LAB — COOXEMETRY PANEL
Carboxyhemoglobin: 1.4 % (ref 0.5–1.5)
Methemoglobin: 0.8 % (ref 0.0–1.5)
O2 Saturation: 59.7 %
Total hemoglobin: 13.5 g/dL (ref 12.0–16.0)

## 2017-04-23 LAB — GLUCOSE, CAPILLARY
Glucose-Capillary: 149 mg/dL — ABNORMAL HIGH (ref 65–99)
Glucose-Capillary: 251 mg/dL — ABNORMAL HIGH (ref 65–99)
Glucose-Capillary: 212 mg/dL — ABNORMAL HIGH (ref 65–99)
Glucose-Capillary: 192 mg/dL — ABNORMAL HIGH (ref 65–99)

## 2017-04-23 LAB — BASIC METABOLIC PANEL
Anion gap: 13 (ref 5–15)
BUN: 37 mg/dL — ABNORMAL HIGH (ref 6–20)
CO2: 23 mmol/L (ref 22–32)
Calcium: 8.4 mg/dL — ABNORMAL LOW (ref 8.9–10.3)
Chloride: 96 mmol/L — ABNORMAL LOW (ref 101–111)
Creatinine, Ser: 1.73 mg/dL — ABNORMAL HIGH (ref 0.61–1.24)
GFR calc Af Amer: 52 mL/min — ABNORMAL LOW (ref >60)
GFR calc non Af Amer: 45 mL/min — ABNORMAL LOW (ref >60)
Glucose, Bld: 161 mg/dL — ABNORMAL HIGH (ref 65–99)
Potassium: 3.5 mmol/L (ref 3.5–5.1)
Sodium: 132 mmol/L — ABNORMAL LOW (ref 135–145)

## 2017-04-23 LAB — HEPATIC FUNCTION PANEL
ALT: 233 U/L — ABNORMAL HIGH (ref 17–63)
AST: 295 U/L — ABNORMAL HIGH (ref 15–41)
Albumin: 2.4 g/dL — ABNORMAL LOW (ref 3.5–5.0)
Alkaline Phosphatase: 124 U/L (ref 38–126)
Bilirubin, Direct: 0.6 mg/dL — ABNORMAL HIGH (ref 0.1–0.5)
Indirect Bilirubin: 0.9 mg/dL (ref 0.3–0.9)
Total Bilirubin: 1.5 mg/dL — ABNORMAL HIGH (ref 0.3–1.2)
Total Protein: 7.2 g/dL (ref 6.5–8.1)

## 2017-04-23 LAB — CBC
HCT: 39.6 % (ref 39.0–52.0)
Hemoglobin: 13 g/dL (ref 13.0–17.0)
MCH: 28.4 pg (ref 26.0–34.0)
MCHC: 32.8 g/dL (ref 30.0–36.0)
MCV: 86.5 fL (ref 78.0–100.0)
Platelets: 189 10*3/uL (ref 150–400)
RBC: 4.58 MIL/uL (ref 4.22–5.81)
RDW: 15.9 % — ABNORMAL HIGH (ref 11.5–15.5)
WBC: 8.3 10*3/uL (ref 4.0–10.5)

## 2017-04-23 LAB — T3: T3, Total: 75 ng/dL (ref 71–180)

## 2017-04-23 LAB — ECHOCARDIOGRAM COMPLETE
Height: 72 in
Weight: 3619.07 oz

## 2017-04-23 LAB — TSH: TSH: 2.36 u[IU]/mL (ref 0.350–4.500)

## 2017-04-23 LAB — HEPARIN LEVEL (UNFRACTIONATED): Heparin Unfractionated: 0.39 IU/mL (ref 0.30–0.70)

## 2017-04-23 LAB — MAGNESIUM: Magnesium: 1.7 mg/dL (ref 1.7–2.4)

## 2017-04-23 MED ORDER — METOLAZONE 2.5 MG PO TABS
2.5000 mg | ORAL_TABLET | Freq: Once | ORAL | Status: AC
Start: 2017-04-23 — End: 2017-04-23
  Administered 2017-04-23: 2.5 mg via ORAL
  Filled 2017-04-23: qty 1

## 2017-04-23 MED ORDER — MAGNESIUM SULFATE 4 GM/100ML IV SOLN
4.0000 g | Freq: Once | INTRAVENOUS | Status: AC
  Administered 2017-04-23: 4 g via INTRAVENOUS
  Filled 2017-04-23: qty 100

## 2017-04-23 MED ORDER — POTASSIUM CHLORIDE CRYS ER 20 MEQ PO TBCR
40.0000 meq | EXTENDED_RELEASE_TABLET | Freq: Once | ORAL | Status: AC
  Administered 2017-04-23: 40 meq via ORAL
  Filled 2017-04-23: qty 2

## 2017-04-23 NOTE — Progress Notes (Signed)
ANTICOAGULATION CONSULT NOTE  Pharmacy Consult for Heparin Indication: atrial fibrillation  Allergies  Allergen Reactions  . Bee Venom     Patient Measurements: Height: 6' (182.9 cm) Weight: 226 lb 3.1 oz (102.6 kg) IBW/kg (Calculated) : 77.6  Heparin dosing weight: 99 kg    Vital Signs: Temp: 98 F (36.7 C) (04/08 0742) Temp Source: Oral (04/08 0742) BP: 97/72 (04/08 0900) Pulse Rate: 131 (04/08 0900)  Labs: Recent Labs    04/21/17 1212 04/21/17 2023 04/22/17 0106 04/22/17 2200 04/23/17 0416 04/23/17 0824  HGB 14.0  --   --   --  13.0  --   HCT 44.0  --   --   --  39.6  --   PLT 168  --   --   --  189  --   HEPARINUNFRC  --   --   --  <0.10*  --  0.39  CREATININE 1.65*  --  1.77*  --  1.73*  --   TROPONINI 0.06* 0.06* 0.06*  --   --   --     Assessment: 46yom with HF admitted in cardiogenic shock and volume overload. Starting on heparin gtt for AFib with RVR.   NO AC as outpatient - previously on rivaroxaban but fell hit head developed [facility] 11/18 last head CT no increase hemorrhage.   Heparin level this morning 0.39, hemoglobin wnl, stable and platelets wnl. No issues with heparin drip overnight or concerns with bleeding.    Goal of Therapy:  Heparin level 0.3-0.7 units/ml Monitor platelets by anticoagulation protocol: Yes   Plan:  No bolus given [facility] 11/18 Continue heparin drip ar 1400 units/hr Daily HL/CBC  Monitor for S/Sx of bleeding   Blake Divine, Pharm.D. PGY1 Pharmacy Resident 04/23/2017 9:52 AM Main Pharmacy: [phone]

## 2017-04-23 NOTE — Plan of Care (Signed)
Patient up to chair, gait steady, tolerated well. Patient's appetite is very good, no signs of infection. Patient understands and cooperates with instructions. Patient has pleasant demeanor, respectful.

## 2017-04-23 NOTE — Progress Notes (Signed)
ANTICOAGULATION CONSULT NOTE  Pharmacy Consult for Heparin Indication: atrial fibrillation  Allergies  Allergen Reactions  . Bee Venom     Patient Measurements: Height: 6' (182.9 cm) Weight: 228 lb 9.9 oz (103.7 kg) IBW/kg (Calculated) : 77.6  Heparin dosing weight: 99 kg    Vital Signs: Temp: 97.9 F (36.6 C) (04/07 2300) Temp Source: Oral (04/07 2300) BP: 124/91 (04/07 2200) Pulse Rate: 131 (04/07 2200)  Labs: Recent Labs    04/21/17 1212 04/21/17 2023 04/22/17 0106 04/22/17 2200  HGB 14.0  --   --   --   HCT 44.0  --   --   --   PLT 168  --   --   --   HEPARINUNFRC  --   --   --  <0.10*  CREATININE 1.65*  --  1.77*  --   TROPONINI 0.06* 0.06* 0.06*  --     Assessment: 46yom with HF admitted in cardiogenic shock and volume overload. Starting on heparin gtt for AFib with RVR.   NO AC as outpatient - previously on rivaroxaban but fell hit head developed [facility] 11/18 last head CT no increase hemorrhage.  Initial heparin level is undetectable - I spoke with the RN, there has not been any issues with the infusion.    Goal of Therapy:  Heparin level 0.3-0.7 units/ml Monitor platelets by anticoagulation protocol: Yes   Plan:  No bolus given [facility] 11/18 Heparin drip to 1400 units/hr HL in 6 hours Daily HL, CBC   Baldemar Friday 04/23/2017 12:29 AM

## 2017-04-23 NOTE — Progress Notes (Addendum)
Advanced Heart Failure Rounding Note   Subjective:    Admitted 04/21/17 with cardiogenic shock and atrial flutter.   Central line placed. Initial co-ox 39%. Now on milrinone 0.25.   Coox 59.7% this am on milrinone 0.25 mcg/kg/min. CVP 13-14   Feeling somewhat better today. Denies SOB, lightheadedness, or palpitations. No CP. Appetite improved.   Creatinine 1.33 -> 1.65 -> 1.77 -> 1.73. Weight down 2 lbs.   Objective:   Weight Range:  Vital Signs:   Temp:  [97.6 F (36.4 C)-98 F (36.7 C)] 98 F (36.7 C) (04/08 0742) Pulse Rate:  [131-147] 133 (04/08 0800) Resp:  [0-36] 19 (04/08 0800) BP: (86-135)/(59-94) 86/69 (04/08 0600) SpO2:  [95 %-100 %] 100 % (04/08 0800) Weight:  [226 lb 3.1 oz (102.6 kg)] 226 lb 3.1 oz (102.6 kg) (04/08 0417) Last BM Date: 04/21/17  Weight change: Filed Weights   04/21/17 1908 04/22/17 0423 04/23/17 0417  Weight: 233 lb 7.5 oz (105.9 kg) 228 lb 9.9 oz (103.7 kg) 226 lb 3.1 oz (102.6 kg)    Intake/Output:   Intake/Output Summary (Last 24 hours) at 04/23/2017 0834 Last data filed at 04/23/2017 0600 Gross per 24 hour  Intake 1354.55 ml  Output 1700 ml  Net -345.45 ml     Physical Exam   General:  Lying in bed. No resp difficulty HEENT: normal anicteric  Neck: supple. JVP to ear . Carotids 2+ bilat; no bruits. No lymphadenopathy or thryomegaly appreciated. Cor: PMI laterally displaced. Tachy irregular +s3  2/6 MR Lungs: crackles in bases Abdomen: soft, nontender, nondistended. No hepatosplenomegaly. No bruits or masses. Good bowel sounds. Extremities: no cyanosis, clubbing, rash, 1-2+ edema Neuro: alert & oriented x 3, cranial nerves grossly intact. moves all 4 extremities w/o difficulty. Affect pleasant  EKG   No new tracings.    Labs    Basic Metabolic Panel: Recent Labs  Lab 04/21/17 1212 04/22/17 0106 04/23/17 0416  NA 131* 131* 132*  K 4.2 4.2 3.5  CL 95* 96* 96*  CO2 _0 GLUCOSE 252* 173* 161*  BUN 42* 40* 37*   CREATININE 1.65* 1.77* 1.73*  CALCIUM 8.6* 8.5* 8.4*  MG  --  2.1 1.7    Liver Function Tests: Recent Labs  Lab 04/21/17 1212 04/23/17 0416  AST <5* 295*  ALT 231* 233*  ALKPHOS 131* 124  BILITOT 1.9* 1.5*  PROT 7.3 7.2  ALBUMIN 2.6* 2.4*   No results for input(s): LIPASE, AMYLASE in the last 168 hours. No results for input(s): AMMONIA in the last 168 hours.  CBC: Recent Labs  Lab 04/21/17 1212 04/23/17 0416  WBC 9.1 8.3  NEUTROABS 6.9  --   HGB 14.0 13.0  HCT 44.0 39.6  MCV 88.9 86.5  PLT 168 189    Cardiac Enzymes: Recent Labs  Lab 04/21/17 1212 04/21/17 2023 04/22/17 0106  TROPONINI 0.06* 0.06* 0.06*    BNP: BNP (last 3 results) Recent Labs    11/06/16 1621 03/25/17 1401 04/21/17 1212  BNP 961.6* 835.4* 483.0*    ProBNP (last 3 results) No results for input(s): PROBNP in the last 8760 hours.    Other results:  Imaging: Dg Chest 2 View  Result Date: 04/21/2017 CLINICAL DATA:  Shortness of breath and cough EXAM: CHEST - 2 VIEW COMPARISON:  March 25, 2017 FINDINGS: The oval opacity projected over the right lateral lung is similar in the interval, consistent with fluid in the minor fissure. No nodules or suspicious masses. Mild atelectasis  in the right base. The heart size is borderline. The hila and mediastinum are normal. No pneumothorax. No other acute abnormalities. No overt edema. IMPRESSION: Stable oval opacity in the right lateral lung, likely fluid in the minor fissure. No other acute abnormalities. Electronically Signed   By: David  Williams III M.D   On: 04/21/2017 13:00   Ct Angio Chest Pe W And/or Wo Contrast  Result Date: 04/21/2017 CLINICAL DATA:  Shortness of breath and history of congestive heart failure. EXAM: CT ANGIOGRAPHY CHEST WITH CONTRAST TECHNIQUE: Multidetector CT imaging of the chest was performed using the standard protocol during bolus administration of intravenous contrast. Multiplanar CT image reconstructions and MIPs were  obtained to evaluate the vascular anatomy. CONTRAST:  100mL ISOVUE-370 IOPAMIDOL (ISOVUE-370) INJECTION 76% COMPARISON:  Prior chest x-rays including earlier today. FINDINGS: Cardiovascular: Satisfactory opacification of the pulmonary arteries to the segmental level. No evidence of pulmonary embolism. Normal heart size. No pericardial effusion. There is reflux of contrast into the intrahepatic IVC and hepatic veins consistent with elevated right heart pressures and right heart failure. Mediastinum/Nodes: There are some scattered small, nonenlarged lymph nodes in the mediastinum. No mediastinal masses. Lungs/Pleura: Small amount of right pleural fluid is present with some fluid in the minor fissure and some fluid extending over the apex of the lung. Lungs show no evidence of overt edema, focal airspace consolidation, pneumothorax or nodule. There are scattered areas of scarring and atelectasis in both lungs. Upper Abdomen: No acute abnormality. Musculoskeletal: No chest wall abnormality. No acute or significant osseous findings. Review of the MIP images confirms the above findings. IMPRESSION: 1. No evidence of pulmonary embolism. 2. Small amount of right pleural fluid with some fluid extending into the minor fissure as well as over the right apex. 3. No evidence of right heart failure and elevated right heart pressures with reflux of contrast into the IVC and hepatic veins. Electronically Signed   By: Glenn  Yamagata M.D.   On: 04/21/2017 15:11   Dg Chest Portable 1 View  Result Date: 04/21/2017 CLINICAL DATA:  46 y/o  M; central line placement. EXAM: PORTABLE CHEST 1 VIEW COMPARISON:  04/21/2017 CT of the chest. FINDINGS: Stable cardiac silhouette given projection and technique. Right central venous catheter tip projects over upper SVC. Stable moderate right-sided pleural effusion. No pneumothorax. Hazy opacification of right lung likely due to effusion. Bones are unremarkable. IMPRESSION: Right central venous  catheter tip projects over upper SVC. No pneumothorax. Stable right pleural effusion. Electronically Signed   By: Lance  Furusawa-Stratton M.D.   On: 04/21/2017 23:57     Medications:     Scheduled Medications: . insulin aspart  0-15 Units Subcutaneous TID WC  . insulin aspart  0-5 Units Subcutaneous QHS  . mouth rinse  15 mL Mouth Rinse BID  . spironolactone  12.5 mg Oral Daily    Infusions: . amiodarone 30 mg/hr (04/23/17 0600)  . furosemide (LASIX) infusion 10 mg/hr (04/23/17 0823)  . heparin 1,400 Units/hr (04/23/17 0600)  . milrinone 0.25 mcg/kg/min (04/23/17 0600)    PRN Medications:     Assessment:   Benjamin Sherman is a 46 y.o. 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 eosinophilic cardiomyopathy, LV thrombus, hypothyroidism, DM2, CKD Stage II-III and traumatic SAH 11/18 after fall.    Plan/Discussion:    1. Acute on chronic systolic HF -> cardiogenic shock  - ECHO 11/2016 EF 25-30% CMRI EF 22%. Bedside echo in HF clinic EF 20% in 3/19 - ?   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  - Continue lasix gtt 10 mg/hr.  - Give metolazone 2.5 mg once with Kdur 40 once.  - Continue spiro 12.5 mg daily - Continue amio gtt for AFL Can consider digoxin - No ACE/ARB/b-blocker with shock - Repeat echo.  - Will need TEE/DC-CV for AFL. Possibly tomorrow if diureses well.  - Overall will likely need advanced therapies but has struggled with noncompliance and lack of family support. Willneed SW eval  2. Atrial flutter - New Onset - Continue amiodarone and hepatin - Plan for TEE/DCCV once optimized. ? Tomorrow.  - This patients CHA2DS2-VASc Score and unadjusted Ischemic Stroke Rate (% per year) is at least  3.2 % stroke rate/year from a score of 3  - Previously off Xarelto due to previous SAH.   3. AKI CKD Stage II-III - Likely ATN -  Continue to follow with hemodynamic support   4. H/o LV Thrombus  - Off Xarelto due to previous [place]  5. DMII - Cover with SSI  6. Hypothyroidism - Check TFT unremarkable 04/22/17.  Length of Stay: 2  Annamaria Helling  04/23/2017, 8:34 AM  Advanced Heart Failure Team Pager [phone] (M-F; 7a - 4p)  Please contact [facility] for night-coverage after hours (4p -7a ) and weekends on [email or web address]  Patient seen and examined with the above-signed Advanced Practice Provider and/or Housestaff. I personally reviewed laboratory data, imaging studies and relevant notes. I independently examined the patient and formulated the important aspects of the plan. I have edited the note to reflect any of my changes or salient points. I have personally discussed the plan with the patient and/or family.  He is improved with milrinone support. Diuresing well. Co-ox better but still somewhat marginal. Remains in AFL with RVR. Now on heparin and amio. Echo reviewed personally LVEF 20-25% with large LV clot. RV mild to moderately down. Patient met with SW. MMSE 29/30. Does not have much family support.   HF improving with inotropes. Etiology of LV dysfunction unclear but clinical deterioration likely due to AFL. Echo with large, recurrent LV clot and thus I am a bit hesitant about electrical cardioversion. Will continue milrinone and IV lasix. Continue heparin and amio. Doubt he is candidate for advanced therapies unless social situation improves dramatically.    Glori Bickers, MD  8:35 PM

## 2017-04-23 NOTE — Progress Notes (Signed)
  Echocardiogram 2D Echocardiogram has been performed.  Leta Jungling M 04/23/2017, 9:21 AM

## 2017-04-24 LAB — COOXEMETRY PANEL
Carboxyhemoglobin: 0.8 % (ref 0.5–1.5)
Carboxyhemoglobin: 1 % (ref 0.5–1.5)
Methemoglobin: 1.1 % (ref 0.0–1.5)
Methemoglobin: 1.3 % (ref 0.0–1.5)
O2 Saturation: 45.6 %
O2 Saturation: 54.9 %
Total hemoglobin: 13.9 g/dL (ref 12.0–16.0)
Total hemoglobin: 13.6 g/dL (ref 12.0–16.0)

## 2017-04-24 LAB — GLUCOSE, CAPILLARY
Glucose-Capillary: 219 mg/dL — ABNORMAL HIGH (ref 65–99)
Glucose-Capillary: 192 mg/dL — ABNORMAL HIGH (ref 65–99)
Glucose-Capillary: 208 mg/dL — ABNORMAL HIGH (ref 65–99)

## 2017-04-24 LAB — CBC
HCT: 39.3 % (ref 39.0–52.0)
Hemoglobin: 12.9 g/dL — ABNORMAL LOW (ref 13.0–17.0)
MCH: 28.4 pg (ref 26.0–34.0)
MCHC: 32.8 g/dL (ref 30.0–36.0)
MCV: 86.6 fL (ref 78.0–100.0)
Platelets: 209 10*3/uL (ref 150–400)
RBC: 4.54 MIL/uL (ref 4.22–5.81)
RDW: 16.6 % — ABNORMAL HIGH (ref 11.5–15.5)
WBC: 9.1 10*3/uL (ref 4.0–10.5)

## 2017-04-24 LAB — BASIC METABOLIC PANEL
Anion gap: 12 (ref 5–15)
BUN: 44 mg/dL — ABNORMAL HIGH (ref 6–20)
CO2: 22 mmol/L (ref 22–32)
Calcium: 8.3 mg/dL — ABNORMAL LOW (ref 8.9–10.3)
Chloride: 94 mmol/L — ABNORMAL LOW (ref 101–111)
Creatinine, Ser: 2.25 mg/dL — ABNORMAL HIGH (ref 0.61–1.24)
GFR calc Af Amer: 38 mL/min — ABNORMAL LOW (ref >60)
GFR calc non Af Amer: 33 mL/min — ABNORMAL LOW (ref >60)
Glucose, Bld: 212 mg/dL — ABNORMAL HIGH (ref 65–99)
Potassium: 4.3 mmol/L (ref 3.5–5.1)
Sodium: 128 mmol/L — ABNORMAL LOW (ref 135–145)

## 2017-04-24 LAB — HEPATITIS PANEL, ACUTE
HCV Ab: <0.1 s/co ratio (ref 0.0–0.9)
Hep A IgM: NEGATIVE
Hep B C IgM: NEGATIVE
Hepatitis B Surface Ag: NEGATIVE

## 2017-04-24 LAB — MAGNESIUM: Magnesium: 2.5 mg/dL — ABNORMAL HIGH (ref 1.7–2.4)

## 2017-04-24 LAB — HEPARIN LEVEL (UNFRACTIONATED): Heparin Unfractionated: 0.56 IU/mL (ref 0.30–0.70)

## 2017-04-24 MED ORDER — MILRINONE LACTATE IN DEXTROSE 20-5 MG/100ML-% IV SOLN
0.5000 ug/kg/min | INTRAVENOUS | Status: DC
  Administered 2017-04-24 (×3): 0.375 ug/kg/min via INTRAVENOUS
  Administered 2017-04-25 (×2): 0.5 ug/kg/min via INTRAVENOUS
  Administered 2017-04-25: 0.375 ug/kg/min via INTRAVENOUS
  Administered 2017-04-26 – 2017-04-29 (×12): 0.5 ug/kg/min via INTRAVENOUS
  Filled 2017-04-24 (×17): qty 100

## 2017-04-24 MED ORDER — NOREPINEPHRINE BITARTRATE 1 MG/ML IV SOLN
5.0000 ug/min | INTRAVENOUS | Status: DC
  Administered 2017-04-24 – 2017-04-25 (×4): 5 ug/min via INTRAVENOUS
  Filled 2017-04-24 (×6): qty 4

## 2017-04-24 MED ORDER — ONDANSETRON HCL 4 MG/2ML IJ SOLN
4.0000 mg | INTRAMUSCULAR | Status: DC | PRN
  Administered 2017-04-26 – 2017-04-27 (×3): 4 mg via INTRAVENOUS
  Filled 2017-04-24 (×3): qty 2

## 2017-04-24 MED ORDER — INSULIN GLARGINE 100 UNIT/ML ~~LOC~~ SOLN
8.0000 [IU] | Freq: Every day | SUBCUTANEOUS | Status: DC
  Administered 2017-04-24 – 2017-04-30 (×6): 8 [IU] via SUBCUTANEOUS
  Filled 2017-04-24 (×7): qty 0.08

## 2017-04-24 NOTE — Progress Notes (Signed)
Patient stated he was missing sweatpants and wallet that was in sweatpants since arriving from [facility]. Room searched with no results.

## 2017-04-24 NOTE — Progress Notes (Signed)
Shammy RN contacted [facility] [employer] in reference to patient's missing sweatpants, keys, and wallet. Attempting to contact [facility] in reference to same.

## 2017-04-24 NOTE — Progress Notes (Addendum)
ANTICOAGULATION CONSULT NOTE  Pharmacy Consult for Heparin Indication: atrial fibrillation/ LV thrombus   Allergies  Allergen Reactions  . Bee Venom     Patient Measurements: Height: 6' (182.9 cm) Weight: 231 lb 7.7 oz (105 kg) IBW/kg (Calculated) : 77.6  Heparin dosing weight: 99 kg    Vital Signs: Temp: 97.7 F (36.5 C) (04/09 0743) Temp Source: Oral (04/09 0743) BP: 91/33 (04/09 0800) Pulse Rate: 124 (04/09 0800)  Labs: Recent Labs    04/21/17 1212 04/21/17 2023 04/22/17 0106 04/22/17 2200 04/23/17 0416 04/23/17 0824 04/24/17 0311 04/24/17 0419  HGB 14.0  --   --   --  13.0  --  12.9*  --   HCT 44.0  --   --   --  39.6  --  39.3  --   PLT 168  --   --   --  189  --  209  --   HEPARINUNFRC  --   --   --  <0.10*  --  0.39 0.56  --   CREATININE 1.65*  --  1.77*  --  1.73*  --   --  2.25*  TROPONINI 0.06* 0.06* 0.06*  --   --   --   --   --     Assessment: 46yom with HF admitted in cardiogenic shock and volume overload. Started on heparin gtt for AFib with RVR.  NO AC as outpatient - previously on rivaroxaban but fell hit head developed [facility] 11/18 last head CT no increase hemorrhage. ECHO performed on 4/8 and shows LVEF 20-25% and large LV clot. Patient requiring increased milrinone and NE this morning. Creatinine trending up, continues on lasix drip. Will continue to monitor clinical progress.   Heparin level this morning at goal at 0.56, hemoglobin and platelets stable. No issues with heparin drip overnight or concerns with bleeding at this time.    Goal of Therapy:  Heparin level 0.3-0.7 units/ml Monitor platelets by anticoagulation protocol: Yes   Plan:  Continue heparin drip ar 1400 units/hr Daily HL/CBC  Monitor for S/Sx of bleeding   Blake Divine, Pharm.D. PGY1 Pharmacy Resident 04/24/2017 9:23 AM Main Pharmacy: [phone]

## 2017-04-24 NOTE — H&P (View-Only) (Signed)
Advanced Heart Failure Rounding Note   Subjective:    Admitted 04/21/17 with cardiogenic shock and atrial flutter.   Central line placed. Initial co-ox 39%. Now on milrinone 0.25.   Coox 59.7% yesterday. Now back to 46%. On amio for AFL. Having nausea. Weak. Uncomfortable.  SBP in 90s    Echo 4/8  reviewed personally LVEF 20-25% with large LV clot. RV mild to moderately down.   Creatinine 1.33 -> 1.65 -> 1.77 -> 1.73.-> 2.25  Objective:   Weight Range:  Vital Signs:   Temp:  [97.3 F (36.3 C)-98 F (36.7 C)] 97.5 F (36.4 C) (04/09 0400) Pulse Rate:  [104-136] 126 (04/09 0500) Resp:  [12-33] 22 (04/09 0500) BP: (72-118)/(52-99) 93/73 (04/09 0500) SpO2:  [92 %-100 %] 100 % (04/09 0500) Weight:  [105 kg (231 lb 7.7 oz)] 105 kg (231 lb 7.7 oz) (04/09 0342) Last BM Date: 04/21/17  Weight change: Filed Weights   04/22/17 0423 04/23/17 0417 04/24/17 0342  Weight: 103.7 kg (228 lb 9.9 oz) 102.6 kg (226 lb 3.1 oz) 105 kg (231 lb 7.7 oz)    Intake/Output:   Intake/Output Summary (Last 24 hours) at 04/24/2017 0610 Last data filed at 04/24/2017 0500 Gross per 24 hour  Intake 1937.8 ml  Output 1300 ml  Net 637.8 ml     Physical Exam   General:  Sitting up on side of bed with dry heaves  uncomfortable   HEENT: normal anicteric Neck: supple. JVP to ear. Carotids 2+ bilat; no bruits. No lymphadenopathy or thryomegaly appreciated. Cor: PMI laterally displaced. Tachy +s3 Lungs: clear Abdomen: soft, nontender, nondistended. No hepatosplenomegaly. No bruits or masses. Good bowel sounds. Extremities: no cyanosis, clubbing, rash, trace edema  Cool  Neuro: alert & orientedx3, cranial nerves grossly intact. moves all 4 extremities w/o difficulty. Affect pleasant   EKG   No new tracings.    Labs    Basic Metabolic Panel: Recent Labs  Lab 04/21/17 1212 04/22/17 0106 04/23/17 0416 04/24/17 0419  NA 131* 131* 132* 128*  K 4.2 4.2 3.5 4.3  CL 95* 96* 96* 94*  CO2 _0 GLUCOSE 252* 173* 161* 212*  BUN 42* 40* 37* 44*  CREATININE 1.65* 1.77* 1.73* 2.25*  CALCIUM 8.6* 8.5* 8.4* 8.3*  MG  --  2.1 1.7 2.5*    Liver Function Tests: Recent Labs  Lab 04/21/17 1212 04/23/17 0416  AST <5* 295*  ALT 231* 233*  ALKPHOS 131* 124  BILITOT 1.9* 1.5*  PROT 7.3 7.2  ALBUMIN 2.6* 2.4*   No results for input(s): LIPASE, AMYLASE in the last 168 hours. No results for input(s): AMMONIA in the last 168 hours.  CBC: Recent Labs  Lab 04/21/17 1212 04/23/17 0416 04/24/17 0311  WBC 9.1 8.3 9.1  NEUTROABS 6.9  --   --   HGB 14.0 13.0 12.9*  HCT 44.0 39.6 39.3  MCV 88.9 86.5 86.6  PLT 168 189 209    Cardiac Enzymes: Recent Labs  Lab 04/21/17 1212 04/21/17 2023 04/22/17 0106  TROPONINI 0.06* 0.06* 0.06*    BNP: BNP (last 3 results) Recent Labs    11/06/16 1621 03/25/17 1401 04/21/17 1212  BNP 961.6* 835.4* 483.0*    ProBNP (last 3 results) No results for input(s): PROBNP in the last 8760 hours.    Other results:  Imaging: No results found.   Medications:     Scheduled Medications: . insulin aspart  0-15 Units Subcutaneous TID WC  . insulin  aspart  0-5 Units Subcutaneous QHS  . mouth rinse  15 mL Mouth Rinse BID  . spironolactone  12.5 mg Oral Daily    Infusions: . amiodarone 30 mg/hr (04/24/17 0500)  . furosemide (LASIX) infusion 10 mg/hr (04/24/17 0500)  . heparin 1,400 Units/hr (04/24/17 0500)  . milrinone 0.25 mcg/kg/min (04/24/17 0500)    PRN Medications:     Assessment:   Benjamin Sherman is a 47 y.o. male with history systolic heart failure dx'd 10/2016, NICM EF 09-81%, ? eosinophilic cardiomyopathy, LV thrombus, hypothyroidism, DM2, CKD Stage II-III and traumatic [facility] 11/18 after fall.    Plan/Discussion:    1. Acute on chronic systolic HF -> cardiogenic shock  - ECHO 11/2016 EF 25-30% CMRI EF 22%. Bedside echo in HF clinic EF 20% in 3/19 - ? 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 Did not respond  to steroids - Admitted 4/6 with cardiogenic shock and volume overload co-ox 38%  - Echo 04/23/17  LVEF 20-25% with large LV clot. RV mild to moderately down. - Coox 59.7% -> 46% on milrinone 0.25 mcg/kg/min.  - He is back in shock with low output symptoms. Increase milrinone to 0.375. Add NE 5. Recheck co-ox 1-2 hours. May need IABP but endpoint unclear as he is marginal candidate for advanced therapies with social situation. Will need to discuss with VAD team ASAP  - Continue lasix gtt 10 mg/hr.  - Continue spiro 12.5 mg daily - Continue amio gtt for AFL  - No ACE/ARB/b-blocker with shock - Patient met with SW. MMSE 29/30. Does not have much family suppor  2. Atrial flutter - New Onset - Continue amiodarone and hepatin - Given large LV clot am reluctant to move forward with DC-CV at this point unless absolutely necessary - This patients CHA2DS2-VASc Score and unadjusted Ischemic Stroke Rate (% per year) is at least  3.2 % stroke rate/year from a score of 3  - Previously off Xarelto due to previous [place].   3. AKI CKD Stage II-III - Likely ATN due to shock. Getting worse. Titrate inotropes. May need IABP    4. H/o LV Thrombus - now recurrent on echo 04/23/17 - Off Xarelto due to previous [place] - Continue heparin   5. Nausea - suspect low output. May also be related to amio - increase inotropes.  - PRN zofran   6. DMII - Cover with SSI  6. Hypothyroidism -TFTs ok  04/22/17.  CRITICAL CARE Performed by: Glori Bickers  Total critical care time: 35 minutes  Critical care time was exclusive of separately billable procedures and treating other patients.  Critical care was necessary to treat or prevent imminent or life-threatening deterioration.  Critical care was time spent personally by me (independent of midlevel providers or residents) on the following activities: development of treatment plan with patient and/or surrogate as well as nursing, discussions with consultants,  evaluation of patient's response to treatment, examination of patient, obtaining history from patient or surrogate, ordering and performing treatments and interventions, ordering and review of laboratory studies, ordering and review of radiographic studies, pulse oximetry and re-evaluation of patient's condition.    Length of Stay: 3  Glori Bickers, MD  04/24/2017, 6:10 AM  Advanced Heart Failure Team Pager [phone] (M-F; 7a - 4p)  Please contact [facility] for night-coverage after hours (4p -7a ) and weekends on [email or web address]

## 2017-04-24 NOTE — Plan of Care (Signed)
Patient able to ambulate independently with assistance only with ensuring IV pole and lines do not interfere with walking. Patient's appetite is very good.

## 2017-04-24 NOTE — Care Management Note (Signed)
Case Management Note Donn Pierini RN, BSN Unit 4E-Case Manager-- 2H coverage [phone]  Patient Details  Name: Benjamin Sherman MRN: [id number] Date of Birth: 1970/04/12  Subjective/Objective:  Pt admitted with cardiogenic shock and atrial flutter now on IV milrinone and IV lasix gtt.                 Action/Plan: PTA pt lived at home- CM to follow for transition of care needs.   Expected Discharge Date:  04/24/17               Expected Discharge Plan:  Home w Home Health Services  In-House Referral:     Discharge planning Services  CM Consult  Post Acute Care Choice:    Choice offered to:     DME Arranged:    DME Agency:     HH Arranged:    HH Agency:     Status of Service:  In process, will continue to follow  If discussed at Long Length of Stay Meetings, dates discussed:    Discharge Disposition:   Additional Comments:  Darrold Span, RN 04/24/2017, 11:15 AM

## 2017-04-24 NOTE — Progress Notes (Signed)
Patient began having N/V symptoms again. Paged Cardiologist on call about obtaining PRN orders for patient again. Have not received any new orders.   Benjamin Sherman E [name], [place]

## 2017-04-24 NOTE — Progress Notes (Signed)
Patient had an episode of N/V. Paged Cards MD on call for some PRN orders twice. No orders have been received.   Patient is now stable and resting, N/V symptoms have passed.   Will continue to monitor.  Kalvyn Desa E [name], [place]

## 2017-04-24 NOTE — Progress Notes (Signed)
Advanced Heart Failure Rounding Note   Subjective:    Admitted 04/21/17 with cardiogenic shock and atrial flutter.   Central line placed. Initial co-ox 39%. Now on milrinone 0.25.   Coox 59.7% yesterday. Now back to 46%. On amio for AFL. Having nausea. Weak. Uncomfortable.  SBP in 90s    Echo 4/8  reviewed personally LVEF 20-25% with large LV clot. RV mild to moderately down.   Creatinine 1.33 -> 1.65 -> 1.77 -> 1.73.-> 2.25  Objective:   Weight Range:  Vital Signs:   Temp:  [97.3 F (36.3 C)-98 F (36.7 C)] 97.5 F (36.4 C) (04/09 0400) Pulse Rate:  [104-136] 126 (04/09 0500) Resp:  [12-33] 22 (04/09 0500) BP: (72-118)/(52-99) 93/73 (04/09 0500) SpO2:  [92 %-100 %] 100 % (04/09 0500) Weight:  [105 kg (231 lb 7.7 oz)] 105 kg (231 lb 7.7 oz) (04/09 0342) Last BM Date: 04/21/17  Weight change: Filed Weights   04/22/17 0423 04/23/17 0417 04/24/17 0342  Weight: 103.7 kg (228 lb 9.9 oz) 102.6 kg (226 lb 3.1 oz) 105 kg (231 lb 7.7 oz)    Intake/Output:   Intake/Output Summary (Last 24 hours) at 04/24/2017 0610 Last data filed at 04/24/2017 0500 Gross per 24 hour  Intake 1937.8 ml  Output 1300 ml  Net 637.8 ml     Physical Exam   General:  Sitting up on side of bed with dry heaves  uncomfortable   HEENT: normal anicteric Neck: supple. JVP to ear. Carotids 2+ bilat; no bruits. No lymphadenopathy or thryomegaly appreciated. Cor: PMI laterally displaced. Tachy +s3 Lungs: clear Abdomen: soft, nontender, nondistended. No hepatosplenomegaly. No bruits or masses. Good bowel sounds. Extremities: no cyanosis, clubbing, rash, trace edema  Cool  Neuro: alert & orientedx3, cranial nerves grossly intact. moves all 4 extremities w/o difficulty. Affect pleasant   EKG   No new tracings.    Labs    Basic Metabolic Panel: Recent Labs  Lab 04/21/17 1212 04/22/17 0106 04/23/17 0416 04/24/17 0419  NA 131* 131* 132* 128*  K 4.2 4.2 3.5 4.3  CL 95* 96* 96* 94*  CO2 _0 GLUCOSE 252* 173* 161* 212*  BUN 42* 40* 37* 44*  CREATININE 1.65* 1.77* 1.73* 2.25*  CALCIUM 8.6* 8.5* 8.4* 8.3*  MG  --  2.1 1.7 2.5*    Liver Function Tests: Recent Labs  Lab 04/21/17 1212 04/23/17 0416  AST <5* 295*  ALT 231* 233*  ALKPHOS 131* 124  BILITOT 1.9* 1.5*  PROT 7.3 7.2  ALBUMIN 2.6* 2.4*   No results for input(s): LIPASE, AMYLASE in the last 168 hours. No results for input(s): AMMONIA in the last 168 hours.  CBC: Recent Labs  Lab 04/21/17 1212 04/23/17 0416 04/24/17 0311  WBC 9.1 8.3 9.1  NEUTROABS 6.9  --   --   HGB 14.0 13.0 12.9*  HCT 44.0 39.6 39.3  MCV 88.9 86.5 86.6  PLT 168 189 209    Cardiac Enzymes: Recent Labs  Lab 04/21/17 1212 04/21/17 2023 04/22/17 0106  TROPONINI 0.06* 0.06* 0.06*    BNP: BNP (last 3 results) Recent Labs    11/06/16 1621 03/25/17 1401 04/21/17 1212  BNP 961.6* 835.4* 483.0*    ProBNP (last 3 results) No results for input(s): PROBNP in the last 8760 hours.    Other results:  Imaging: No results found.   Medications:     Scheduled Medications: . insulin aspart  0-15 Units Subcutaneous TID WC  . insulin  aspart  0-5 Units Subcutaneous QHS  . mouth rinse  15 mL Mouth Rinse BID  . spironolactone  12.5 mg Oral Daily    Infusions: . amiodarone 30 mg/hr (04/24/17 0500)  . furosemide (LASIX) infusion 10 mg/hr (04/24/17 0500)  . heparin 1,400 Units/hr (04/24/17 0500)  . milrinone 0.25 mcg/kg/min (04/24/17 0500)    PRN Medications:     Assessment:   Benjamin Sherman is a 47 y.o. male with history systolic heart failure dx'd 10/2016, NICM EF 09-81%, ? eosinophilic cardiomyopathy, LV thrombus, hypothyroidism, DM2, CKD Stage II-III and traumatic [facility] 11/18 after fall.    Plan/Discussion:    1. Acute on chronic systolic HF -> cardiogenic shock  - ECHO 11/2016 EF 25-30% CMRI EF 22%. Bedside echo in HF clinic EF 20% in 3/19 - ? 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 Did not respond  to steroids - Admitted 4/6 with cardiogenic shock and volume overload co-ox 38%  - Echo 04/23/17  LVEF 20-25% with large LV clot. RV mild to moderately down. - Coox 59.7% -> 46% on milrinone 0.25 mcg/kg/min.  - He is back in shock with low output symptoms. Increase milrinone to 0.375. Add NE 5. Recheck co-ox 1-2 hours. May need IABP but endpoint unclear as he is marginal candidate for advanced therapies with social situation. Will need to discuss with VAD team ASAP  - Continue lasix gtt 10 mg/hr.  - Continue spiro 12.5 mg daily - Continue amio gtt for AFL  - No ACE/ARB/b-blocker with shock - Patient met with SW. MMSE 29/30. Does not have much family suppor  2. Atrial flutter - New Onset - Continue amiodarone and hepatin - Given large LV clot am reluctant to move forward with DC-CV at this point unless absolutely necessary - This patients CHA2DS2-VASc Score and unadjusted Ischemic Stroke Rate (% per year) is at least  3.2 % stroke rate/year from a score of 3  - Previously off Xarelto due to previous [place].   3. AKI CKD Stage II-III - Likely ATN due to shock. Getting worse. Titrate inotropes. May need IABP    4. H/o LV Thrombus - now recurrent on echo 04/23/17 - Off Xarelto due to previous [place] - Continue heparin   5. Nausea - suspect low output. May also be related to amio - increase inotropes.  - PRN zofran   6. DMII - Cover with SSI  6. Hypothyroidism -TFTs ok  04/22/17.  CRITICAL CARE Performed by: Glori Bickers  Total critical care time: 35 minutes  Critical care time was exclusive of separately billable procedures and treating other patients.  Critical care was necessary to treat or prevent imminent or life-threatening deterioration.  Critical care was time spent personally by me (independent of midlevel providers or residents) on the following activities: development of treatment plan with patient and/or surrogate as well as nursing, discussions with consultants,  evaluation of patient's response to treatment, examination of patient, obtaining history from patient or surrogate, ordering and performing treatments and interventions, ordering and review of laboratory studies, ordering and review of radiographic studies, pulse oximetry and re-evaluation of patient's condition.    Length of Stay: 3  Glori Bickers, MD  04/24/2017, 6:10 AM  Advanced Heart Failure Team Pager [phone] (M-F; 7a - 4p)  Please contact [facility] for night-coverage after hours (4p -7a ) and weekends on [email or web address]

## 2017-04-24 NOTE — Progress Notes (Signed)
Dr Gala Romney requested CSW meet with patient for preliminary work up for possible advanced therapies. MD requested a MMSE performed which patient scored a 29.  Patient shared that he resides with a friend Management consultant in [place]. Patient states that he lived with his mother most of his life until last summer when he had to place her in a SNF and "the county took our house". Patient became homeless at that time and his friend allowed to him to move in. Patient states he has been contributing to the expenses until recently when he became sick and unable to work. His roommate has been understanding and supportive per his report. 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 He last worked last week. No insurance or disability benefits through employer although patient reports he currently has medicaid. He states he has never married and has no children. He was vague about family and mentioned some extended family in [place]. He did mention a niece who his mother raised from birth but she "disappeared after my mom went into the SNF".   Patient appears to have limited caregivers and resources. CSW will discuss further with MD and proceed pending MD recommendation. Lasandra Beech, LCSW, CCSW-MCS [phone]

## 2017-04-24 NOTE — Progress Notes (Signed)
Inpatient Diabetes Program Recommendations  AACE/ADA: New Consensus Statement on Inpatient Glycemic Control (2015)  Target Ranges:  Prepandial:   less than 140 mg/dL      Peak postprandial:   less than 180 mg/dL (1-2 hours)      Critically ill patients:  140 - 180 mg/dL    Review of Glycemic Control  Diabetes history: DM 2 Outpatient Diabetes medications: None Current orders for Inpatient glycemic control: Novolog Moderate Correction 0-15 units tid + Novolog HS scale  Inpatient Diabetes Program Recommendations:    A1c 7.5% this admission.  Glucose trends elevated. Consider low dose basal insulin, Lantus 8 units while here.  Thanks,  Christena Deem RN, MSN, BC-ADM, [facility] Inpatient Diabetes Coordinator Team Pager [phone] (8a-5p)

## 2017-04-24 NOTE — Progress Notes (Signed)
RN has contacted [facility] as well as security. Neither has patients missing belongings but will call should they have more information. RN student has also looked through patients belongings with his permission and were unable to find missing items. Room has been searched as well with no success. Director of unit is aware.

## 2017-04-24 NOTE — Progress Notes (Signed)
Patient sweat pants, car keys, and wallet located on 2W. Patient has received belongings, confirmed that they were the items missing, and now has possession of belongings.

## 2017-04-25 ENCOUNTER — Inpatient Hospital Stay (HOSPITAL_COMMUNITY)
Admission: EM | Disposition: A | Discharge status: Home Health | Payer: Self-pay | Source: Home / Self Care | Attending: Internal Medicine

## 2017-04-25 ENCOUNTER — Inpatient Hospital Stay (HOSPITAL_COMMUNITY): Payer: Medicaid Other

## 2017-04-25 DIAGNOSIS — I480 Paroxysmal atrial fibrillation: Secondary | ICD-10-CM

## 2017-04-25 HISTORY — PX: IABP INSERTION: CATH118242

## 2017-04-25 HISTORY — PX: RIGHT HEART CATH: CATH118263

## 2017-04-25 LAB — CBC
HCT: 38.4 % — ABNORMAL LOW (ref 39.0–52.0)
Hemoglobin: 12.9 g/dL — ABNORMAL LOW (ref 13.0–17.0)
MCH: 28.9 pg (ref 26.0–34.0)
MCHC: 33.6 g/dL (ref 30.0–36.0)
MCV: 85.9 fL (ref 78.0–100.0)
Platelets: 179 10*3/uL (ref 150–400)
RBC: 4.47 MIL/uL (ref 4.22–5.81)
RDW: 17.1 % — ABNORMAL HIGH (ref 11.5–15.5)
WBC: 9 10*3/uL (ref 4.0–10.5)

## 2017-04-25 LAB — BASIC METABOLIC PANEL
Anion gap: 14 (ref 5–15)
BUN: 48 mg/dL — ABNORMAL HIGH (ref 6–20)
CO2: 22 mmol/L (ref 22–32)
Calcium: 8.7 mg/dL — ABNORMAL LOW (ref 8.9–10.3)
Chloride: 91 mmol/L — ABNORMAL LOW (ref 101–111)
Creatinine, Ser: 2.39 mg/dL — ABNORMAL HIGH (ref 0.61–1.24)
GFR calc Af Amer: 35 mL/min — ABNORMAL LOW (ref >60)
GFR calc non Af Amer: 30 mL/min — ABNORMAL LOW (ref >60)
Glucose, Bld: 151 mg/dL — ABNORMAL HIGH (ref 65–99)
Potassium: 3.7 mmol/L (ref 3.5–5.1)
Sodium: 127 mmol/L — ABNORMAL LOW (ref 135–145)

## 2017-04-25 LAB — GLUCOSE, CAPILLARY
Glucose-Capillary: 183 mg/dL — ABNORMAL HIGH (ref 65–99)
Glucose-Capillary: 163 mg/dL — ABNORMAL HIGH (ref 65–99)
Glucose-Capillary: 201 mg/dL — ABNORMAL HIGH (ref 65–99)
Glucose-Capillary: 146 mg/dL — ABNORMAL HIGH (ref 65–99)
Glucose-Capillary: 209 mg/dL — ABNORMAL HIGH (ref 65–99)

## 2017-04-25 LAB — COOXEMETRY PANEL
Carboxyhemoglobin: 1 % (ref 0.5–1.5)
Carboxyhemoglobin: 1.6 % — ABNORMAL HIGH (ref 0.5–1.5)
Methemoglobin: 1.4 % (ref 0.0–1.5)
Methemoglobin: 0.7 % (ref 0.0–1.5)
O2 Saturation: 49.3 %
O2 Saturation: 64.2 %
Total hemoglobin: 13.2 g/dL (ref 12.0–16.0)
Total hemoglobin: 13.4 g/dL (ref 12.0–16.0)

## 2017-04-25 LAB — POCT I-STAT 3, ART BLOOD GAS (G3+)
Acid-base deficit: 3 mmol/L — ABNORMAL HIGH (ref 0.0–2.0)
Bicarbonate: 19.9 mmol/L — ABNORMAL LOW (ref 20.0–28.0)
O2 Saturation: 93 %
TCO2: 21 mmol/L — ABNORMAL LOW (ref 22–32)
pCO2 arterial: 29.5 mmHg — ABNORMAL LOW (ref 32.0–48.0)
pH, Arterial: 7.437 (ref 7.350–7.450)
pO2, Arterial: 64 mmHg — ABNORMAL LOW (ref 83.0–108.0)

## 2017-04-25 LAB — ABO/RH: ABO/RH(D): A POS

## 2017-04-25 LAB — POCT I-STAT 3, VENOUS BLOOD GAS (G3P V)
Acid-base deficit: 2 mmol/L (ref 0.0–2.0)
Bicarbonate: 22.4 mmol/L (ref 20.0–28.0)
O2 Saturation: 58 %
TCO2: 23 mmol/L (ref 22–32)
pCO2, Ven: 36.3 mmHg — ABNORMAL LOW (ref 44.0–60.0)
pH, Ven: 7.398 (ref 7.250–7.430)
pO2, Ven: 30 mmHg — CL (ref 32.0–45.0)

## 2017-04-25 LAB — TYPE AND SCREEN
ABO/RH(D): A POS
Antibody Screen: NEGATIVE

## 2017-04-25 LAB — MAGNESIUM: Magnesium: 2.2 mg/dL (ref 1.7–2.4)

## 2017-04-25 LAB — HEPARIN LEVEL (UNFRACTIONATED): Heparin Unfractionated: 0.43 IU/mL (ref 0.30–0.70)

## 2017-04-25 SURGERY — IABP INSERTION
Anesthesia: LOCAL

## 2017-04-25 MED ORDER — SODIUM CHLORIDE 0.9 % IV SOLN: INTRAVENOUS | Status: DC

## 2017-04-25 MED ORDER — FENTANYL CITRATE (PF) 100 MCG/2ML IJ SOLN
INTRAMUSCULAR | Status: DC | PRN
  Administered 2017-04-25: 25 ug via INTRAVENOUS

## 2017-04-25 MED ORDER — HEPARIN (PORCINE) IN NACL 2-0.9 UNIT/ML-% IJ SOLN
INTRAMUSCULAR | Status: AC | PRN
  Administered 2017-04-25 (×2): 500 mL via INTRA_ARTERIAL

## 2017-04-25 MED ORDER — SODIUM CHLORIDE 0.9 % IV SOLN: 250.0000 mL | INTRAVENOUS | Status: DC | PRN

## 2017-04-25 MED ORDER — FENTANYL CITRATE (PF) 100 MCG/2ML IJ SOLN
INTRAMUSCULAR | Status: AC
  Filled 2017-04-25: qty 2

## 2017-04-25 MED ORDER — SODIUM CHLORIDE 0.9 % IV SOLN
INTRAVENOUS | Status: DC
  Administered 2017-04-28 – 2017-04-30 (×3): via INTRAVENOUS

## 2017-04-25 MED ORDER — SODIUM CHLORIDE 0.9% FLUSH: 3.0000 mL | INTRAVENOUS | Status: DC | PRN

## 2017-04-25 MED ORDER — SODIUM CHLORIDE 0.9% FLUSH: 3.0000 mL | Freq: Two times a day (BID) | INTRAVENOUS | Status: DC

## 2017-04-25 MED ORDER — CHLORHEXIDINE GLUCONATE CLOTH 2 % EX PADS
6.0000 | MEDICATED_PAD | Freq: Every day | CUTANEOUS | Status: DC
  Administered 2017-04-25 – 2017-05-13 (×13): 6 via TOPICAL

## 2017-04-25 MED ORDER — MIDAZOLAM HCL 2 MG/2ML IJ SOLN
INTRAMUSCULAR | Status: DC | PRN
  Administered 2017-04-25: 1 mg via INTRAVENOUS

## 2017-04-25 MED ORDER — HEPARIN (PORCINE) IN NACL 2-0.9 UNIT/ML-% IJ SOLN
INTRAMUSCULAR | Status: AC
  Filled 2017-04-25: qty 500

## 2017-04-25 MED ORDER — SODIUM CHLORIDE 0.9% FLUSH
10.0000 mL | Freq: Two times a day (BID) | INTRAVENOUS | Status: DC
  Administered 2017-04-26 – 2017-04-27 (×3): 10 mL
  Administered 2017-04-27: 40 mL
  Administered 2017-04-28 (×2): 10 mL
  Administered 2017-04-29: 20 mL
  Administered 2017-05-01 – 2017-05-11 (×14): 10 mL

## 2017-04-25 MED ORDER — SODIUM CHLORIDE 0.9% FLUSH: 10.0000 mL | INTRAVENOUS | Status: DC | PRN

## 2017-04-25 MED ORDER — FUROSEMIDE 10 MG/ML IJ SOLN
20.0000 mg/h | INTRAVENOUS | Status: DC
  Administered 2017-04-25 – 2017-04-29 (×6): 20 mg/h via INTRAVENOUS
  Filled 2017-04-25: qty 21
  Filled 2017-04-25: qty 25
  Filled 2017-04-25 (×2): qty 21
  Filled 2017-04-25 (×5): qty 25
  Filled 2017-04-25: qty 20
  Filled 2017-04-25: qty 25

## 2017-04-25 MED ORDER — LIDOCAINE HCL (PF) 1 % IJ SOLN
INTRAMUSCULAR | Status: DC | PRN
  Administered 2017-04-25: 5 mL via INTRADERMAL
  Administered 2017-04-25: 10 mL via INTRADERMAL

## 2017-04-25 MED ORDER — MIDAZOLAM HCL 2 MG/2ML IJ SOLN
INTRAMUSCULAR | Status: AC
  Filled 2017-04-25: qty 2

## 2017-04-25 SURGICAL SUPPLY — 16 items
BALLN IABP SENSA PLUS 8F 50CC (BALLOONS) ×2
BALLOON IABP SENS PLUS 8F 50CC (BALLOONS) IMPLANT
CATH SWAN GANZ 7F STRAIGHT (CATHETERS) ×1 IMPLANT
COVER PRB 48X5XTLSCP FOLD TPE (BAG) IMPLANT
COVER PROBE 5X48 (BAG) ×2
PACK CARDIAC CATHETERIZATION (CUSTOM PROCEDURE TRAY) ×2 IMPLANT
PROTECTION STATION PRESSURIZED (MISCELLANEOUS) ×2
SHEATH AVANTI 11CM 5FR (SHEATH) ×1 IMPLANT
SHEATH AVANTI 11CM 7FR (SHEATH) ×1 IMPLANT
SHEATH AVANTI 11CM 8FR (SHEATH) ×1 IMPLANT
SLEEVE REPOSITIONING LENGTH 30 (MISCELLANEOUS) ×1 IMPLANT
STATION PROTECTION PRESSURIZED (MISCELLANEOUS) IMPLANT
TRANSDUCER W/STOPCOCK (MISCELLANEOUS) ×2 IMPLANT
TUBING ART PRESS 72  MALE/FEM (TUBING) ×1
TUBING ART PRESS 72 MALE/FEM (TUBING) IMPLANT
WIRE EMERALD 3MM-J .035X150CM (WIRE) ×1 IMPLANT

## 2017-04-25 NOTE — Progress Notes (Signed)
Pt transferred to cath lab via bed with RN . Handoff given . Benjamin Sherman

## 2017-04-25 NOTE — Plan of Care (Signed)
  Problem: Education: Goal: Knowledge of General Education information will improve Outcome: Progressing   Problem: Nutrition: Goal: Adequate nutrition will be maintained Outcome: Progressing   Problem: Coping: Goal: Level of anxiety will decrease Outcome: Progressing   

## 2017-04-25 NOTE — Interval H&P Note (Signed)
History and Physical Interval Note:  04/25/2017 4:34 PM  Benjamin Sherman  has presented today for surgery, with the diagnosis of chf, cardiogenic shock  The various methods of treatment have been discussed with the patient and family. After consideration of risks, benefits and other options for treatment, the patient has consented to  Procedure(s): IABP INSERTION (N/A) RIGHT HEART CATH (N/A) as a surgical intervention .  The patient's history has been reviewed, patient examined, no change in status, stable for surgery.  I have reviewed the patient's chart and labs.  Questions were answered to the patient's satisfaction.     [name]

## 2017-04-25 NOTE — Progress Notes (Addendum)
Advanced Heart Failure Rounding Note   Subjective:    Admitted 04/21/17 with cardiogenic shock and atrial flutter.   Central line placed. Initial co-ox 39%.  Coox 49.3% this am at 0330 on milrinone 0.375 mcg/kg/min and NE 5. (Increased yesterday)  Feeling OK this am. Remains very fatigued.  No CP. Denies SOB, but hasn't really been up and around.   On amio for AFL.  Echo 4/8 reviewed personally LVEF 20-25% with large LV clot. RV mild to moderately down.   Creatinine 1.33 -> 1.65 -> 1.77 -> 1.73.-> 2.25 -> 2.39. Weight up 7 lbs. CVP 17-18.  Objective:   Weight Range:  Vital Signs:   Temp:  [97.3 F (36.3 C)-98.1 F (36.7 C)] 97.5 F (36.4 C) (04/10 0753) Pulse Rate:  [25-130] 123 (04/10 0600) Resp:  [13-30] 21 (04/10 0600) BP: (83-129)/(55-91) 110/81 (04/10 0600) SpO2:  [93 %-100 %] 96 % (04/10 0600) Weight:  [238 lb (108 kg)] 238 lb (108 kg) (04/10 0600) Last BM Date: 04/21/17  Weight change: Filed Weights   04/23/17 0417 04/24/17 0342 04/25/17 0600  Weight: 226 lb 3.1 oz (102.6 kg) 231 lb 7.7 oz (105 kg) 238 lb (108 kg)   Intake/Output:   Intake/Output Summary (Last 24 hours) at 04/25/2017 0831 Last data filed at 04/25/2017 0400 Gross per 24 hour  Intake 1906 ml  Output 925 ml  Net 981 ml    Physical Exam   General: Fatigued appearing. NAD HEENT: Normal Neck: Supple. JVP to jaw. Carotids 2+ bilat; no bruits. No thyromegaly or nodule noted. Cor: PMI lateral. Tachy. + S3. Lungs: CTAB, normal effort. Abdomen: Soft, non-tender, non-distended, no HSM. No bruits or masses. +BS  Extremities: No cyanosis, clubbing, or rash. 1+ edema. Cool.   Neuro: Alert & orientedx3, cranial nerves grossly intact. moves all 4 extremities w/o difficulty. Affect pleasant   EKG   NT, ND, no HSM. No bruits or masses. +BS   Labs    Basic Metabolic Panel: Recent Labs  Lab 04/21/17 1212 04/22/17 0106 04/23/17 0416 04/24/17 0419 04/25/17 0624  NA 131* 131* 132* 128* 127*   K 4.2 4.2 3.5 4.3 3.7  CL 95* 96* 96* 94* 91*  CO2 '22 22 23 22 22  ' GLUCOSE 252* 173* 161* 212* 151*  BUN 42* 40* 37* 44* 48*  CREATININE 1.65* 1.77* 1.73* 2.25* 2.39*  CALCIUM 8.6* 8.5* 8.4* 8.3* 8.7*  MG  --  2.1 1.7 2.5* 2.2   Liver Function Tests: Recent Labs  Lab 04/21/17 1212 04/23/17 0416  AST <5* 295*  ALT 231* 233*  ALKPHOS 131* 124  BILITOT 1.9* 1.5*  PROT 7.3 7.2  ALBUMIN 2.6* 2.4*   No results for input(s): LIPASE, AMYLASE in the last 168 hours. No results for input(s): AMMONIA in the last 168 hours.  CBC: Recent Labs  Lab 04/21/17 1212 04/23/17 0416 04/24/17 0311 04/25/17 0323  WBC 9.1 8.3 9.1 9.0  NEUTROABS 6.9  --   --   --   HGB 14.0 13.0 12.9* 12.9*  HCT 44.0 39.6 39.3 38.4*  MCV 88.9 86.5 86.6 85.9  PLT 168 189 209 179   Cardiac Enzymes: Recent Labs  Lab 04/21/17 1212 04/21/17 2023 04/22/17 0106  TROPONINI 0.06* 0.06* 0.06*   BNP: BNP (last 3 results) Recent Labs    11/06/16 1621 03/25/17 1401 04/21/17 1212  BNP 961.6* 835.4* 483.0*   ProBNP (last 3 results) No results for input(s): PROBNP in the last 8760 hours.  Other results:  Imaging:  No results found.  Medications:    Scheduled Medications: . Chlorhexidine Gluconate Cloth  6 each Topical Daily  . insulin aspart  0-15 Units Subcutaneous TID WC  . insulin aspart  0-5 Units Subcutaneous QHS  . insulin glargine  8 Units Subcutaneous QHS  . mouth rinse  15 mL Mouth Rinse BID  . sodium chloride flush  10-40 mL Intracatheter Q12H  . spironolactone  12.5 mg Oral Daily    Infusions: . amiodarone 30 mg/hr (04/25/17 0407)  . furosemide (LASIX) infusion 10 mg/hr (04/24/17 2000)  . heparin 1,400 Units/hr (04/25/17 0109)  . milrinone 0.375 mcg/kg/min (04/25/17 0502)  . norepinephrine (LEVOPHED) Adult infusion 5 mcg/min (04/25/17 5361)    PRN Medications:    Assessment:   Benjamin Sherman is a 47 y.o. 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  eosinophilic cardiomyopathy, LV thrombus, hypothyroidism, DM2, CKD Stage II-III and traumatic [facility] 11/18 after fall.    Plan/Discussion:    1. Acute on chronic systolic HF -> cardiogenic shock  - ECHO 11/2016 EF 25-30% CMRI EF 22%. Bedside echo in HF clinic EF 20% in 3/19 - ? 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 Did not respond to steroids - Admitted 4/6 with cardiogenic shock and volume overload co-ox 38%  - Echo 04/23/17  LVEF 20-25% with large LV clot. RV mild to moderately down. - Coox 49.3% this am on milrinone 0.375 mcg/kg/min and NE 5. Increase milrinone to 0.5 mcg/kg/min.  -  May need IABP but endpoint unclear as he is marginal candidate for advanced therapies with social situation. Will need to discuss with VAD team ASAP  - Continue lasix gtt 10 mg/hr. Weight and creatinine trending up.  - Continue spiro 12.5 mg daily - Continue amio gtt for AFL  - No ACE/ARB/b-blocker with shock - Patient met with SW. MMSE 29/30. Does not have much family support.   2. Atrial flutter - New Onset - Continue amiodarone and heparin - Given large LV clot am reluctant to move forward with DC-CV at this point unless absolutely necessary - This patients CHA2DS2-VASc Score and unadjusted Ischemic Stroke Rate (% per year) is at least  3.2 % stroke rate/year from a score of 3  - Previously off Xarelto due to previous [place].   3. AKI CKD Stage II-III - Likely ATN due to shock.  - Continues to trend up. Titrating inotrope. May need IABP.   4. H/o LV Thrombus - now recurrent on echo 04/23/17 - Off Xarelto due to previous [place] - Continue heparin.   5. Nausea - Suspect related to low output.  May also be related to [facility]  - Covering with PRN  zofran   6. DMII - Cover with SSI.   6. Hypothyroidism -TFTs OK 04/22/17.  Length of Stay: 4  Annamaria Helling  04/25/2017, 8:31 AM  Advanced Heart Failure Team Pager [phone] (M-F; 7a - 4p)  Please contact [facility] for  night-coverage after hours (4p -7a ) and weekends on [email or web address]  Agree with above.   He has profound shock with worsening end-organ function despite titration of inotropes. On echo has severe biventricular dysfunction with prominent LV clot. Remains in AF with RVR despite IV amio.   On exam Weak appearing JVP to jaw Cor IRR tachy +s3 Lungs clear Ab soft NT/ND Ext cool. Mild edema  Options very limited. Long talk with VAD team and patient not caadidate for VAD or transplant due to biventricular dysfunction and lack  of social support. Will plan IABP today to help support shock and then high-risk DC-CV (despite LV clot) as restroing NSR may be only way to overcome shock.   CRITICAL CARE Performed by: Glori Bickers  Total critical care time: 35 minutes  Critical care time was exclusive of separately billable procedures and treating other patients.  Critical care was necessary to treat or prevent imminent or life-threatening deterioration.  Critical care was time spent personally by me (independent of midlevel providers or residents) on the following activities: development of treatment plan with patient and/or surrogate as well as nursing, discussions with consultants, evaluation of patient's response to treatment, examination of patient, obtaining history from patient or surrogate, ordering and performing treatments and interventions, ordering and review of laboratory studies, ordering and review of radiographic studies, pulse oximetry and re-evaluation of patient's condition.  Glori Bickers, MD  4:37 PM

## 2017-04-25 NOTE — Progress Notes (Signed)
ANTICOAGULATION CONSULT NOTE  Pharmacy Consult for Heparin Indication: atrial fibrillation/ LV thrombus   Allergies  Allergen Reactions  . Bee Venom     Patient Measurements: Height: 6' (182.9 cm) Weight: 238 lb (108 kg) IBW/kg (Calculated) : 77.6  Heparin dosing weight: 99 kg    Vital Signs: Temp: 97.5 F (36.4 C) (04/10 0753) Temp Source: Oral (04/10 0753) BP: 99/89 (04/10 0800) Pulse Rate: 121 (04/10 0800)  Labs: Recent Labs    04/23/17 0416 04/23/17 0824 04/24/17 0311 04/24/17 0419 04/25/17 0323 04/25/17 0624  HGB 13.0  --  12.9*  --  12.9*  --   HCT 39.6  --  39.3  --  38.4*  --   PLT 189  --  209  --  179  --   HEPARINUNFRC  --  0.39 0.56  --   --  0.43  CREATININE 1.73*  --   --  2.25*  --  2.39*    Assessment: 46yom with HF admitted in cardiogenic shock and volume overload. Started on heparin gtt for AFib with RVR.  NO AC as outpatient - previously on rivaroxaban but fell hit head developed [facility] 11/18 last head CT no increase hemorrhage. ECHO performed on 4/8 and shows LVEF 20-25% and large LV clot. Patient requiring increased milrinone and NE this morning. Creatinine trending up, continues on lasix drip. Will continue to monitor clinical progress.   Heparin level this morning at goal at 0.43, hemoglobin and platelets stable. No issues with heparin drip overnight or concerns with bleeding at this time.    Goal of Therapy:  Heparin level 0.3-0.7 units/ml Monitor platelets by anticoagulation protocol: Yes   Plan:  Continue heparin drip at 1400 units/hr Daily HL/CBC  Monitor for S/Sx of bleeding   Blake Divine, Pharm.D. PGY1 Pharmacy Resident 04/25/2017 9:02 AM Main Pharmacy: [phone]

## 2017-04-25 NOTE — Progress Notes (Signed)
ANTICOAGULATION CONSULT NOTE  Pharmacy Consult for Heparin Indication: atrial fibrillation/ LV thrombus   Allergies  Allergen Reactions  . Bee Venom     Patient Measurements: Height: 6' (182.9 cm) Weight: 238 lb (108 kg) IBW/kg (Calculated) : 77.6  Heparin dosing weight: 99 kg    Vital Signs: Temp: 97.3 F (36.3 C) (04/10 1140) Temp Source: Oral (04/10 1140) BP: 98/65 (04/10 1729) Pulse Rate: 118 (04/10 1729)  Labs: Recent Labs    04/23/17 0416 04/23/17 0824 04/24/17 0311 04/24/17 0419 04/25/17 0323 04/25/17 0624  HGB 13.0  --  12.9*  --  12.9*  --   HCT 39.6  --  39.3  --  38.4*  --   PLT 189  --  209  --  179  --   HEPARINUNFRC  --  0.39 0.56  --   --  0.43  CREATININE 1.73*  --   --  2.25*  --  2.39*    Assessment: 46yom with HF admitted in cardiogenic shock and volume overload. Started on heparin gtt for AFib with RVR.  No AC as outpatient - previously on rivaroxaban but fell hit head and developed [facility] 11/18. Last head CT showed no increase hemorrhage. ECHO 4/8 showed LVEF 20-25% and large LV clot. Heparin off for cath lab 4/10, now Pharmacy consulted to resume heparin with IABP successfully placed.  Heparin level this morning was within lower IABP goal 0.2-0.5 at 0.43, hemoglobin and platelets stable. No issues with bleeding documented.  Goal of Therapy:  Heparin level 0.2-0.5 units/ml Monitor platelets by anticoagulation protocol: Yes   Plan:  Resume heparin drip at previous therapeutic rate of 1400 units/hr 6h heparin level Monitor daily heparin level and CBC, s/sx bleeding Cardiology planning TEE/DCCV 4/11 per note  Babs Bertin, PharmD, BCPS Clinical Pharmacist 04/25/2017 6:04 PM

## 2017-04-26 ENCOUNTER — Inpatient Hospital Stay (HOSPITAL_COMMUNITY): Payer: Medicaid Other

## 2017-04-26 ENCOUNTER — Encounter (HOSPITAL_COMMUNITY): Payer: Self-pay | Admitting: Internal Medicine

## 2017-04-26 LAB — POCT I-STAT 3, VENOUS BLOOD GAS (G3P V)
Acid-base deficit: 2 mmol/L (ref 0.0–2.0)
Bicarbonate: 21.8 mmol/L (ref 20.0–28.0)
O2 Saturation: 56 %
TCO2: 23 mmol/L (ref 22–32)
pCO2, Ven: 35.1 mmHg — ABNORMAL LOW (ref 44.0–60.0)
pH, Ven: 7.401 (ref 7.250–7.430)
pO2, Ven: 29 mmHg — CL (ref 32.0–45.0)

## 2017-04-26 LAB — CBC
HCT: 36.6 % — ABNORMAL LOW (ref 39.0–52.0)
Hemoglobin: 12.1 g/dL — ABNORMAL LOW (ref 13.0–17.0)
MCH: 28.1 pg (ref 26.0–34.0)
MCHC: 33.1 g/dL (ref 30.0–36.0)
MCV: 84.9 fL (ref 78.0–100.0)
Platelets: 190 10*3/uL (ref 150–400)
RBC: 4.31 MIL/uL (ref 4.22–5.81)
RDW: 16.4 % — ABNORMAL HIGH (ref 11.5–15.5)
WBC: 8.4 10*3/uL (ref 4.0–10.5)

## 2017-04-26 LAB — CULTURE, BLOOD (ROUTINE X 2)
Culture: NO GROWTH
Culture: NO GROWTH
Special Requests: ADEQUATE
Special Requests: ADEQUATE

## 2017-04-26 LAB — GLUCOSE, CAPILLARY
Glucose-Capillary: 183 mg/dL — ABNORMAL HIGH (ref 65–99)
Glucose-Capillary: 185 mg/dL — ABNORMAL HIGH (ref 65–99)
Glucose-Capillary: 175 mg/dL — ABNORMAL HIGH (ref 65–99)
Glucose-Capillary: 145 mg/dL — ABNORMAL HIGH (ref 65–99)

## 2017-04-26 LAB — BASIC METABOLIC PANEL
Anion gap: 13 (ref 5–15)
Anion gap: 13 (ref 5–15)
BUN: 49 mg/dL — ABNORMAL HIGH (ref 6–20)
BUN: 46 mg/dL — ABNORMAL HIGH (ref 6–20)
CO2: 24 mmol/L (ref 22–32)
CO2: 24 mmol/L (ref 22–32)
Calcium: 8.7 mg/dL — ABNORMAL LOW (ref 8.9–10.3)
Calcium: 8.9 mg/dL (ref 8.9–10.3)
Chloride: 92 mmol/L — ABNORMAL LOW (ref 101–111)
Chloride: 91 mmol/L — ABNORMAL LOW (ref 101–111)
Creatinine, Ser: 2.31 mg/dL — ABNORMAL HIGH (ref 0.61–1.24)
Creatinine, Ser: 2.19 mg/dL — ABNORMAL HIGH (ref 0.61–1.24)
GFR calc Af Amer: 37 mL/min — ABNORMAL LOW (ref >60)
GFR calc Af Amer: 39 mL/min — ABNORMAL LOW (ref >60)
GFR calc non Af Amer: 32 mL/min — ABNORMAL LOW (ref >60)
GFR calc non Af Amer: 34 mL/min — ABNORMAL LOW (ref >60)
Glucose, Bld: 178 mg/dL — ABNORMAL HIGH (ref 65–99)
Glucose, Bld: 179 mg/dL — ABNORMAL HIGH (ref 65–99)
Potassium: 3.4 mmol/L — ABNORMAL LOW (ref 3.5–5.1)
Potassium: 3.6 mmol/L (ref 3.5–5.1)
Sodium: 129 mmol/L — ABNORMAL LOW (ref 135–145)
Sodium: 128 mmol/L — ABNORMAL LOW (ref 135–145)

## 2017-04-26 LAB — MAGNESIUM
Magnesium: 2 mg/dL (ref 1.7–2.4)
Magnesium: 1.9 mg/dL (ref 1.7–2.4)

## 2017-04-26 LAB — COOXEMETRY PANEL
Carboxyhemoglobin: 1.3 % (ref 0.5–1.5)
Methemoglobin: 1.3 % (ref 0.0–1.5)
O2 Saturation: 61.4 %
Total hemoglobin: 12.5 g/dL (ref 12.0–16.0)

## 2017-04-26 LAB — HEPARIN LEVEL (UNFRACTIONATED)
Heparin Unfractionated: 0.33 IU/mL (ref 0.30–0.70)
Heparin Unfractionated: 0.44 IU/mL (ref 0.30–0.70)

## 2017-04-26 MED ORDER — POTASSIUM CHLORIDE CRYS ER 20 MEQ PO TBCR
40.0000 meq | EXTENDED_RELEASE_TABLET | Freq: Once | ORAL | Status: AC
Start: 2017-04-26 — End: 2017-04-26
  Administered 2017-04-26: 40 meq via ORAL
  Filled 2017-04-26: qty 2

## 2017-04-26 MED ORDER — NOREPINEPHRINE BITARTRATE 1 MG/ML IV SOLN
5.0000 ug/min | INTRAVENOUS | Status: DC
  Administered 2017-04-26 – 2017-04-28 (×2): 5 ug/min via INTRAVENOUS
  Filled 2017-04-26 (×2): qty 16

## 2017-04-26 MED ORDER — POTASSIUM CHLORIDE 10 MEQ/50ML IV SOLN
10.0000 meq | INTRAVENOUS | Status: AC
  Administered 2017-04-26 (×3): 10 meq via INTRAVENOUS
  Filled 2017-04-26 (×3): qty 50

## 2017-04-26 MED ORDER — SODIUM CHLORIDE 0.9% FLUSH
3.0000 mL | Freq: Two times a day (BID) | INTRAVENOUS | Status: DC
  Administered 2017-04-27: 3 mL via INTRAVENOUS
  Administered 2017-04-28: 10 mL via INTRAVENOUS
  Administered 2017-04-28 – 2017-04-29 (×2): 3 mL via INTRAVENOUS
  Administered 2017-04-29: 10 mL via INTRAVENOUS
  Administered 2017-04-30 – 2017-05-07 (×8): 3 mL via INTRAVENOUS

## 2017-04-26 MED ORDER — SODIUM CHLORIDE 0.9 % IV SOLN
250.0000 mL | INTRAVENOUS | Status: DC
  Administered 2017-05-01: 05:00:00 via INTRAVENOUS

## 2017-04-26 MED ORDER — SODIUM CHLORIDE 0.9% FLUSH
3.0000 mL | INTRAVENOUS | Status: DC | PRN
  Administered 2017-05-02: 3 mL via INTRAVENOUS
  Filled 2017-04-26: qty 3

## 2017-04-26 MED ORDER — MAGNESIUM SULFATE IN D5W 1-5 GM/100ML-% IV SOLN
1.0000 g | Freq: Once | INTRAVENOUS | Status: AC
  Administered 2017-04-26: 1 g via INTRAVENOUS
  Filled 2017-04-26: qty 100

## 2017-04-26 MED FILL — Heparin Sodium (Porcine) 2 Unit/ML in Sodium Chloride 0.9%: INTRAMUSCULAR | Qty: 1000 | Status: AC

## 2017-04-26 NOTE — Progress Notes (Signed)
ANTICOAGULATION CONSULT NOTE - Follow Up Consult  Pharmacy Consult for heparin Indication: Afib/LV thrombus/IABP  Labs: Recent Labs    04/23/17 0416  04/24/17 0311 04/24/17 0419 04/25/17 0323 04/25/17 0624 04/26/17 0003  HGB 13.0  --  12.9*  --  12.9*  --   --   HCT 39.6  --  39.3  --  38.4*  --   --   PLT 189  --  209  --  179  --   --   HEPARINUNFRC  --    < > 0.56  --   --  0.43 0.33  CREATININE 1.73*  --   --  2.25*  --  2.39*  --    < > = values in this interval not displayed.    Assessment/Plan:  47yo male therapeutic on heparin after resumed. Will continue gtt at current rate and confirm stable with am labs.   Vernard Gambles, PharmD, BCPS  04/26/2017,12:49 AM

## 2017-04-26 NOTE — Progress Notes (Addendum)
Advanced Heart Failure Rounding Note   Subjective:    Admitted 04/21/17 with cardiogenic shock and atrial flutter.   Central line placed. Initial co-ox 39%.  Taken for IABP placement 04/25/17 with persistent cardiogenic shock.   Coox 61.4% this am milrinone 0.5 mcg/kg/min and NE 5.   Feeling tired and sore this am but breathing better. Denies orthopnea. Very fatigued.  Significant UOP noted overnight. No CP.   On amio for AFL.Rate still fast but somewhat improved.  Echo 4/8 reviewed personally LVEF 20-25% with large LV clot. RV mild to moderately down.   Creatinine 1.33 -> 1.65 -> 1.77 -> 1.73.-> 2.25 -> 2.39 -> 2.31. K 3.4. Weight down 1 lb. CVP 14.  Swan numbers (personally reviewed) PA 40/21 (31) CVP 14 CO 5.5 CI 2.4  Objective:   Weight Range:  Vital Signs:   Temp:  [96.6 F (35.9 C)-97.5 F (36.4 C)] 97 F (36.1 C) (04/11 0700) Pulse Rate:  [115-134] 120 (04/11 0700) Resp:  [0-37] 23 (04/11 0700) BP: (96-123)/(55-91) 113/63 (04/11 0700) SpO2:  [88 %-100 %] 99 % (04/11 0700) Arterial Line BP: (74-109)/(65-87) 99/77 (04/11 0700) Weight:  [237 lb 7 oz (107.7 kg)] 237 lb 7 oz (107.7 kg) (04/11 0445) Last BM Date: 04/24/17  Weight change: Filed Weights   04/24/17 0342 04/25/17 0600 04/26/17 0445  Weight: 231 lb 7.7 oz (105 kg) 238 lb (108 kg) 237 lb 7 oz (107.7 kg)   Intake/Output:   Intake/Output Summary (Last 24 hours) at 04/26/2017 0728 Last data filed at 04/26/2017 0700 Gross per 24 hour  Intake 3077.56 ml  Output 4050 ml  Net -972.44 ml    Physical Exam   General: Chronically ill appearing. NAD.  HEENT: Normal Neck: Supple. JVP to jaw. Carotids 2+ bilat; no bruits. No thyromegaly or nodule noted. Cor: PMI lateral. Tachy. +S3.  Lungs: CTAB, normal effort. Abdomen: Soft, non-tender, non-distended, no HSM. No bruits or masses. +BS IABP audible.  Extremities: No cyanosis, clubbing, or rash. 2+ edema. Warm. RFA IABP site stable.  Neuro: Alert &  orientedx3, cranial nerves grossly intact. moves all 4 extremities w/o difficulty. Affect pleasant   Telemetry    AFL 120s, personally reviewed.   EKG   No new tracings.    Labs    Basic Metabolic Panel: Recent Labs  Lab 04/22/17 0106 04/23/17 0416 04/24/17 0419 04/25/17 0624 04/26/17 0427  NA 131* 132* 128* 127* 129*  K 4.2 3.5 4.3 3.7 3.4*  CL 96* 96* 94* 91* 92*  CO2 '22 23 22 22 24  ' GLUCOSE 173* 161* 212* 151* 178*  BUN 40* 37* 44* 48* 49*  CREATININE 1.77* 1.73* 2.25* 2.39* 2.31*  CALCIUM 8.5* 8.4* 8.3* 8.7* 8.7*  MG 2.1 1.7 2.5* 2.2 2.0   Liver Function Tests: Recent Labs  Lab 04/21/17 1212 04/23/17 0416  AST <5* 295*  ALT 231* 233*  ALKPHOS 131* 124  BILITOT 1.9* 1.5*  PROT 7.3 7.2  ALBUMIN 2.6* 2.4*   No results for input(s): LIPASE, AMYLASE in the last 168 hours. No results for input(s): AMMONIA in the last 168 hours.  CBC: Recent Labs  Lab 04/21/17 1212 04/23/17 0416 04/24/17 0311 04/25/17 0323 04/26/17 0427  WBC 9.1 8.3 9.1 9.0 8.4  NEUTROABS 6.9  --   --   --   --   HGB 14.0 13.0 12.9* 12.9* 12.1*  HCT 44.0 39.6 39.3 38.4* 36.6*  MCV 88.9 86.5 86.6 85.9 84.9  PLT 168 189 209 179 190  Cardiac Enzymes: Recent Labs  Lab 04/21/17 1212 04/21/17 2023 04/22/17 0106  TROPONINI 0.06* 0.06* 0.06*   BNP: BNP (last 3 results) Recent Labs    11/06/16 1621 03/25/17 1401 04/21/17 1212  BNP 961.6* 835.4* 483.0*   ProBNP (last 3 results) No results for input(s): PROBNP in the last 8760 hours.  Other results:  Imaging: Dg Chest Port 1 View  Result Date: 04/25/2017 CLINICAL DATA:  CHF EXAM: PORTABLE CHEST 1 VIEW COMPARISON:  04/21/2017, CT chest 04/21/2017, radiograph 03/25/2017 FINDINGS: Right-sided central venous catheter tip overlies the SVC. Insertion of left Swan-Ganz catheter with tip projecting over right inter lobar pulmonary artery. Cardiomegaly. Small right-sided pleural effusion with pleural fluid along the right minor fissure.  No pneumothorax. IMPRESSION: 1. Interval placement of left-sided Swan-Ganz catheter with tip projecting over the right inter lobar pulmonary artery. 2. No significant change in cardiomegaly or small right-sided pleural effusion Electronically Signed   By: Donavan Foil M.D.   On: 04/25/2017 19:31    Medications:    Scheduled Medications: . Chlorhexidine Gluconate Cloth  6 each Topical Daily  . insulin aspart  0-15 Units Subcutaneous TID WC  . insulin aspart  0-5 Units Subcutaneous QHS  . insulin glargine  8 Units Subcutaneous QHS  . sodium chloride flush  10-40 mL Intracatheter Q12H  . sodium chloride flush  3 mL Intravenous Q12H  . spironolactone  12.5 mg Oral Daily    Infusions: . sodium chloride    . sodium chloride 10 mL/hr at 04/25/17 1900  . sodium chloride    . amiodarone 30 mg/hr (04/26/17 0440)  . furosemide (LASIX) infusion 20 mg/hr (04/25/17 2028)  . heparin 1,400 Units/hr (04/25/17 2148)  . milrinone 0.5 mcg/kg/min (04/26/17 0205)  . norepinephrine (LEVOPHED) Adult infusion 5 mcg/min (04/25/17 2148)    PRN Medications:    Assessment:   Benjamin Sherman is a 47 y.o. male with history systolic heart failure dx'd 10/2016, NICM EF 41-74%, ? eosinophilic cardiomyopathy, LV thrombus, hypothyroidism, DM2, CKD Stage II-III and traumatic [facility] 11/18 after fall.    Plan/Discussion:    1. Acute on chronic systolic HF -> cardiogenic shock  - ECHO 11/2016 EF 25-30% CMRI EF 22%. Bedside echo in HF clinic EF 20% in 3/19 - ? 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 Did not respond to steroids - Admitted 4/6 with cardiogenic shock and volume overload co-ox 38%  - Echo 04/23/17  LVEF 20-25% with large LV clot. RV mild to moderately down. - Coox 61.4% on milrinone 0.5 mcg/kg/min and NE 5.  - IABP at 1:1 with good waveforms. Improved UOP.  - Continue lasix gtt 20 mg/hr. Weight and creatinine trending up.  - Continue spiro 12.5 mg daily - Continue amio gtt for AFL  - No  ACE/ARB/b-blocker with shock - Patient met with SW. MMSE 29/30. Does not have much family support.   2. Atrial flutter - New Onset.  - Continue amiodarone and heparin - Given large LV clot am reluctant to move forward with DC-CV at this point unless absolutely necessary - This patients CHA2DS2-VASc Score and unadjusted Ischemic Stroke Rate (% per year) is at least  3.2 % stroke rate/year from a score of 3  - Previously off Xarelto due to previous [place].   3. AKI CKD Stage II-III - Likely ATN due to shock.  - Trended down slightly now with IABP. Continue to follow.   4. H/o LV Thrombus - now recurrent on echo 04/23/17 - Off Xarelto due to previous [place] -  Continue heparin.   5. Nausea - Slightly improved. Likely related to low output.   - Covering with PRN  zofran   6. DMII - Cover with SSI.   6. Hypothyroidism - TFTs OK 04/22/17.  Length of Stay: 5  Annamaria Helling  04/26/2017, 7:28 AM  Advanced Heart Failure Team Pager [phone] (M-F; 7a - 4p)  Please contact [facility] for night-coverage after hours (4p -7a ) and weekends on [email or web address]  Agree.   IABP placed last night for profound shock despite to pressors. Co-ox much improved this am. Now diuresis improved. Creatinine also improving. Remains markedly volume overloaded with CVP 15-20 range on my check. Fatigued on exam. Still in AFL with RVR despite IV amio.   Exam Lying flat in bed fatigued JVP to jaw. RIJ TLC LIJ swan Cor tachy regular +s3 Lungs + crackles Ab mildly distended Ext. RFA IABP. 2+ edema. Good pulses   He has profound shock with biventricular failure. He is not transplant candidate (despite being A+) due to poor social situation. Only hope for survival is to get him out of AFL and hope for some LV recovery to wean support or perhaps tolerate VAD but RV dysfunction will likely not tolerate. Plan DCCV tomorrow despite known LV thrombus. He is aware of risk of stroke but realizes it is his only  option for possible recovery.   CRITICAL CARE Performed by: Glori Bickers  Total critical care time: 45 minutes  Critical care time was exclusive of separately billable procedures and treating other patients.  Critical care was necessary to treat or prevent imminent or life-threatening deterioration.  Critical care was time spent personally by me (independent of midlevel providers or residents) on the following activities: development of treatment plan with patient and/or surrogate as well as nursing, discussions with consultants, evaluation of patient's response to treatment, examination of patient, obtaining history from patient or surrogate, ordering and performing treatments and interventions, ordering and review of laboratory studies, ordering and review of radiographic studies, pulse oximetry and re-evaluation of patient's condition.  Glori Bickers, MD  9:35 PM

## 2017-04-26 NOTE — Progress Notes (Signed)
ANTICOAGULATION CONSULT NOTE  Pharmacy Consult for Heparin Indication: atrial fibrillation/ LV thrombus   Allergies  Allergen Reactions  . Bee Venom     Patient Measurements: Height: 6' (182.9 cm) Weight: 237 lb 7 oz (107.7 kg) IBW/kg (Calculated) : 77.6  Heparin dosing weight: 99 kg    Vital Signs: Temp: 96.3 F (35.7 C) (04/11 1000) Temp Source: Core (04/11 0800) BP: 117/80 (04/11 1000) Pulse Rate: 210 (04/11 1000)  Labs: Recent Labs    04/24/17 0311 04/24/17 0419 04/25/17 0323 04/25/17 0624 04/26/17 0003 04/26/17 0427 04/26/17 0642  HGB 12.9*  --  12.9*  --   --  12.1*  --   HCT 39.3  --  38.4*  --   --  36.6*  --   PLT 209  --  179  --   --  190  --   HEPARINUNFRC 0.56  --   --  0.43 0.33  --  0.44  CREATININE  --  2.25*  --  2.39*  --  2.31*  --     Assessment: 46yom with HF admitted in cardiogenic shock and volume overload. Started on heparin gtt for AFib with RVR.  No AC as outpatient - previously on rivaroxaban but fell hit head and developed [facility] 11/18. Last head CT showed no increase hemorrhage. ECHO 4/8 showed LVEF 20-25% and large LV clot. Heparin back on last night after placement of IABP.   Heparin level this morning resulted as 0.44 and within goal range with LV thrombus and IABP, hemoglobin and platelets stable. No issues with bleeding documented. Noted cardiology plans for DCCV tomorrow.   Goal of Therapy:  Heparin level 0.2-0.5 units/ml Monitor platelets by anticoagulation protocol: Yes   Plan:  Continue heparin at 1400 units/hour Monitor daily heparin level and CBC, s/sx bleeding   Blake Divine, Pharm.D. PGY1 Pharmacy Resident 04/26/2017 10:41 AM Main Pharmacy: [phone]

## 2017-04-27 ENCOUNTER — Encounter (HOSPITAL_COMMUNITY)
Admission: EM | Disposition: A | Discharge status: Home Health | Payer: Self-pay | Source: Home / Self Care | Attending: Internal Medicine

## 2017-04-27 ENCOUNTER — Inpatient Hospital Stay (HOSPITAL_COMMUNITY): Payer: Medicaid Other | Admitting: Anesthesiology

## 2017-04-27 HISTORY — PX: CARDIOVERSION: SHX1299

## 2017-04-27 LAB — HEPARIN LEVEL (UNFRACTIONATED): Heparin Unfractionated: 0.37 IU/mL (ref 0.30–0.70)

## 2017-04-27 LAB — BASIC METABOLIC PANEL
Anion gap: 12 (ref 5–15)
BUN: 37 mg/dL — ABNORMAL HIGH (ref 6–20)
CO2: 25 mmol/L (ref 22–32)
Calcium: 9 mg/dL (ref 8.9–10.3)
Chloride: 91 mmol/L — ABNORMAL LOW (ref 101–111)
Creatinine, Ser: 2.02 mg/dL — ABNORMAL HIGH (ref 0.61–1.24)
GFR calc Af Amer: 43 mL/min — ABNORMAL LOW (ref >60)
GFR calc non Af Amer: 37 mL/min — ABNORMAL LOW (ref >60)
Glucose, Bld: 158 mg/dL — ABNORMAL HIGH (ref 65–99)
Potassium: 3.8 mmol/L (ref 3.5–5.1)
Sodium: 128 mmol/L — ABNORMAL LOW (ref 135–145)

## 2017-04-27 LAB — CBC
HCT: 39.8 % (ref 39.0–52.0)
Hemoglobin: 13.3 g/dL (ref 13.0–17.0)
MCH: 28.4 pg (ref 26.0–34.0)
MCHC: 33.4 g/dL (ref 30.0–36.0)
MCV: 85 fL (ref 78.0–100.0)
Platelets: 157 10*3/uL (ref 150–400)
RBC: 4.68 MIL/uL (ref 4.22–5.81)
RDW: 16.2 % — ABNORMAL HIGH (ref 11.5–15.5)
WBC: 9.8 10*3/uL (ref 4.0–10.5)

## 2017-04-27 LAB — COOXEMETRY PANEL
Carboxyhemoglobin: 1.4 % (ref 0.5–1.5)
Methemoglobin: 1.3 % (ref 0.0–1.5)
O2 Saturation: 63.9 %
Total hemoglobin: 13.4 g/dL (ref 12.0–16.0)

## 2017-04-27 LAB — GLUCOSE, CAPILLARY
Glucose-Capillary: 169 mg/dL — ABNORMAL HIGH (ref 65–99)
Glucose-Capillary: 132 mg/dL — ABNORMAL HIGH (ref 65–99)
Glucose-Capillary: 214 mg/dL — ABNORMAL HIGH (ref 65–99)

## 2017-04-27 LAB — MAGNESIUM: Magnesium: 2.2 mg/dL (ref 1.7–2.4)

## 2017-04-27 SURGERY — CARDIOVERSION
Anesthesia: General

## 2017-04-27 MED ORDER — ETOMIDATE 2 MG/ML IV SOLN
INTRAVENOUS | Status: DC | PRN
  Administered 2017-04-27: 6 mg via INTRAVENOUS

## 2017-04-27 MED ORDER — LIDOCAINE HCL (CARDIAC) 20 MG/ML IV SOLN
INTRAVENOUS | Status: DC | PRN
  Administered 2017-04-27: 100 mg via INTRATRACHEAL

## 2017-04-27 MED ORDER — POTASSIUM CHLORIDE CRYS ER 20 MEQ PO TBCR
40.0000 meq | EXTENDED_RELEASE_TABLET | Freq: Once | ORAL | Status: AC
  Administered 2017-04-27: 40 meq via ORAL
  Filled 2017-04-27: qty 2

## 2017-04-27 MED ORDER — POTASSIUM CHLORIDE CRYS ER 20 MEQ PO TBCR: 20.0000 meq | EXTENDED_RELEASE_TABLET | Freq: Once | ORAL | Status: DC

## 2017-04-27 NOTE — Anesthesia Procedure Notes (Addendum)
Procedure Name: MAC Date/Time: 04/27/2017 10:12 AM Performed by: Nolon Nations, MD Pre-anesthesia Checklist: Patient identified, Suction available, Emergency Drugs available, Patient being monitored and Timeout performed Patient Re-evaluated:Patient Re-evaluated prior to induction Oxygen Delivery Method: Ambu bag Preoxygenation: Pre-oxygenation with 100% oxygen Induction Type: IV induction Placement Confirmation: positive ETCO2 and breath sounds checked- equal and bilateral

## 2017-04-27 NOTE — Anesthesia Preprocedure Evaluation (Addendum)
Anesthesia Evaluation  Patient identified by MRN, date of birth, ID band Patient awake    Reviewed: Allergy & Precautions, NPO status , Patient's Chart, lab work & pertinent test results  Airway Mallampati: II  TM Distance: >3 FB Neck ROM: Full    Dental  (+) Poor Dentition   Pulmonary neg pulmonary ROS, former smoker,    Pulmonary exam normal breath sounds clear to auscultation       Cardiovascular +CHF   Rhythm:Irregular Rate:Tachycardia     Neuro/Psych negative neurological ROS  negative psych ROS   GI/Hepatic negative GI ROS, Neg liver ROS,   Endo/Other  diabetesHypothyroidism   Renal/GU Renal disease     Musculoskeletal negative musculoskeletal ROS (+)   Abdominal   Peds  Hematology negative hematology ROS (+)   Anesthesia Other Findings   Reproductive/Obstetrics negative OB ROS                            Anesthesia Physical Anesthesia Plan  ASA: IV and emergent  Anesthesia Plan: General   Post-op Pain Management:    Induction: Intravenous  PONV Risk Score and Plan: 2 and Treatment may vary due to age or medical condition  Airway Management Planned: Mask  Additional Equipment:   Intra-op Plan:   Post-operative Plan:   Informed Consent: I have reviewed the patients History and Physical, chart, labs and discussed the procedure including the risks, benefits and alternatives for the proposed anesthesia with the patient or authorized representative who has indicated his/her understanding and acceptance.   Dental advisory given  Plan Discussed with: CRNA  Anesthesia Plan Comments:        Anesthesia Quick Evaluation

## 2017-04-27 NOTE — Progress Notes (Addendum)
Advanced Heart Failure Rounding Note   Subjective:    Admitted 04/21/17 with cardiogenic shock and atrial flutter.   Central line placed. Initial co-ox 39%.  IABP placed 04/25/17 with persistent cardiogenic shock.   Coox 63.9% on milrinone 0.5 mcg/kg/min and NE 5 and IABP 1:1  Feeling somewhat better. Remains fatigued. Denies CP. No lightheadedness or dizziness, but has been lying in bed.   On amio for AFL.Rate still fast but somewhat improved.  Echo 4/8 reviewed personally LVEF 20-25% with LV apical clot. RV mild to moderately down.   Creatinine peak 2.4. Trending down. K 3.8. Weight down 13 lbs (bed weight). 5.4 UOP. CVP 13-14 cm  Swan numbers (personally reviewed) PA 46/30 (36) CVP ~13-14 CO 3.9 CI 1.7  Objective:   Weight Range:  Vital Signs:   Temp:  [95.7 F (35.4 C)-96.8 F (36 C)] 96.6 F (35.9 C) (04/12 0628) Pulse Rate:  [116-266] 126 (04/12 0500) Resp:  [0-23] 17 (04/12 0628) BP: (114-143)/(66-104) 143/81 (04/12 0700) SpO2:  [95 %-100 %] 99 % (04/12 0500) Arterial Line BP: (80-113)/(75-100) 96/79 (04/12 1740) Weight:  [224 lb 6.9 oz (101.8 kg)] 224 lb 6.9 oz (101.8 kg) (04/12 0628) Last BM Date: 04/24/17  Weight change: Filed Weights   04/25/17 0600 04/26/17 0445 04/27/17 0628  Weight: 238 lb (108 kg) 237 lb 7 oz (107.7 kg) 224 lb 6.9 oz (101.8 kg)   Intake/Output:   Intake/Output Summary (Last 24 hours) at 04/27/2017 0824 Last data filed at 04/27/2017 0700 Gross per 24 hour  Intake 2299.67 ml  Output 5575 ml  Net -3275.33 ml    Physical Exam   General: Chronically ill appearing. NAD  HEENT: Normal Neck: Supple. JVP to jaw. Carotids 2+ bilat; no bruits. No thyromegaly or nodule noted. Cor: PMI lateral. Irregular, Tachy. +S3.  Lungs: CTAB, normal effort. Abdomen: Soft, non-tender, non-distended, no HSM. No bruits or masses. +BS  Extremities: No cyanosis, clubbing, or rash. 1-2+ edema. RFA IABP site stable.  Neuro: Alert & orientedx3,  cranial nerves grossly intact. moves all 4 extremities w/o difficulty. Affect pleasant   Telemetry    AFL 120s, personally reviewed.   EKG   No new tracings.    Labs    Basic Metabolic Panel: Recent Labs  Lab 04/24/17 0419 04/25/17 0624 04/26/17 0427 04/26/17 1400 04/27/17 0404  NA 128* 127* 129* 128* 128*  K 4.3 3.7 3.4* 3.6 3.8  CL 94* 91* 92* 91* 91*  CO2 '22 22 24 24 25  ' GLUCOSE 212* 151* 178* 179* 158*  BUN 44* 48* 49* 46* 37*  CREATININE 2.25* 2.39* 2.31* 2.19* 2.02*  CALCIUM 8.3* 8.7* 8.7* 8.9 9.0  MG 2.5* 2.2 2.0 1.9 2.2   Liver Function Tests: Recent Labs  Lab 04/21/17 1212 04/23/17 0416  AST <5* 295*  ALT 231* 233*  ALKPHOS 131* 124  BILITOT 1.9* 1.5*  PROT 7.3 7.2  ALBUMIN 2.6* 2.4*   No results for input(s): LIPASE, AMYLASE in the last 168 hours. No results for input(s): AMMONIA in the last 168 hours.  CBC: Recent Labs  Lab 04/21/17 1212 04/23/17 0416 04/24/17 0311 04/25/17 0323 04/26/17 0427 04/27/17 0404  WBC 9.1 8.3 9.1 9.0 8.4 9.8  NEUTROABS 6.9  --   --   --   --   --   HGB 14.0 13.0 12.9* 12.9* 12.1* 13.3  HCT 44.0 39.6 39.3 38.4* 36.6* 39.8  MCV 88.9 86.5 86.6 85.9 84.9 85.0  PLT 168 189 209 179 190 157  Cardiac Enzymes: Recent Labs  Lab 04/21/17 1212 04/21/17 2023 04/22/17 0106  TROPONINI 0.06* 0.06* 0.06*   BNP: BNP (last 3 results) Recent Labs    11/06/16 1621 03/25/17 1401 04/21/17 1212  BNP 961.6* 835.4* 483.0*   ProBNP (last 3 results) No results for input(s): PROBNP in the last 8760 hours.  Other results:  Imaging: Dg Chest Port 1 View  Result Date: 04/26/2017 CLINICAL DATA:  IABP present, former smoker, history of CHF EXAM: PORTABLE CHEST 1 VIEW COMPARISON:  Portable chest x-ray of 04/25/2017 FINDINGS: The lungs are not quite is well aerated. A pseudotumor appears to be present at the right lung base consistent with fluid within the right minor fissure which was present previously. Mild volume loss at the  right lung base has increased somewhat. Also there appears to be somewhat loculated fluid overlying the right lung apex. Right IJ central venous line overlies the upper SVC and intra aortic balloon pump is present and unchanged. The heart is mildly enlarged. Swan-Ganz catheter is unchanged with the tip extending toward the right lower lobe pulmonary artery. IMPRESSION: 1. No change in position of central venous line and Swan-Ganz catheter. 2. Slightly diminished aeration with minimal increase in basilar atelectasis. 3. No change in pseudotumor on the right. 4. IABP remains unchanged in position. Electronically Signed   By: Ivar Drape M.D.   On: 04/26/2017 09:05   Dg Chest Port 1 View  Result Date: 04/25/2017 CLINICAL DATA:  CHF EXAM: PORTABLE CHEST 1 VIEW COMPARISON:  04/21/2017, CT chest 04/21/2017, radiograph 03/25/2017 FINDINGS: Right-sided central venous catheter tip overlies the SVC. Insertion of left Swan-Ganz catheter with tip projecting over right inter lobar pulmonary artery. Cardiomegaly. Small right-sided pleural effusion with pleural fluid along the right minor fissure. No pneumothorax. IMPRESSION: 1. Interval placement of left-sided Swan-Ganz catheter with tip projecting over the right inter lobar pulmonary artery. 2. No significant change in cardiomegaly or small right-sided pleural effusion Electronically Signed   By: Donavan Foil M.D.   On: 04/25/2017 19:31    Medications:    Scheduled Medications: . Chlorhexidine Gluconate Cloth  6 each Topical Daily  . insulin aspart  0-15 Units Subcutaneous TID WC  . insulin aspart  0-5 Units Subcutaneous QHS  . insulin glargine  8 Units Subcutaneous QHS  . sodium chloride flush  10-40 mL Intracatheter Q12H  . sodium chloride flush  3 mL Intravenous Q12H  . sodium chloride flush  3 mL Intravenous Q12H  . spironolactone  12.5 mg Oral Daily    Infusions: . sodium chloride    . sodium chloride 10 mL/hr at 04/27/17 0700  . sodium chloride      . sodium chloride    . amiodarone 30 mg/hr (04/27/17 0700)  . furosemide (LASIX) infusion 20 mg/hr (04/27/17 0700)  . heparin 1,400 Units/hr (04/27/17 0700)  . milrinone 0.5 mcg/kg/min (04/27/17 0700)  . norepinephrine (LEVOPHED) Adult infusion 5 mcg/min (04/27/17 0700)    PRN Medications:    Assessment:   Benjamin Sherman is a 47 y.o. male with history systolic heart failure dx'd 10/2016, NICM EF 74-12%, ? eosinophilic cardiomyopathy, LV thrombus, hypothyroidism, DM2, CKD Stage II-III and traumatic [facility] 11/18 after fall.   Admitted with cardiogenic shock.  Plan/Discussion:    1. Acute on chronic systolic HF -> cardiogenic shock  - ECHO 11/2016 EF 25-30% CMRI EF 22%. Bedside echo in HF clinic EF 20% in 3/19 - ? 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 Did not respond to steroids - Admitted  4/6 with cardiogenic shock and volume overload co-ox 38%  - Echo 04/23/17  LVEF 20-25% with large LV clot. RV mild to moderately down. - Coox 63.9% on milrinone 0.5 mcg/kg/min and NE 5 - IABP at 1:1 with good waveforms.   - Continue lasix gtt 20 mg/hr. UOP much improved. Weight trending down and Cr trending down.  - Continue spiro 12.5 mg daily. - Continue amio gtt for AFL. Plan DCCV today.  - No ACE/ARB/b-blocker with shock - Patient met with SW. MMSE 29/30. Does not have much family support.   2. Atrial flutter - New Onset.  - Continue amiodarone and heparin - Plan to proceed with urgent DCCV due to patients continued cardiogenic shock.  - Echo with large LV thrombus. Pt aware that with large LV clot there is risk of stroke or MI. Agrees to proceed with DCCV.  - This patients CHA2DS2-VASc Score and unadjusted Ischemic Stroke Rate (% per year) is at least  3.2 % stroke rate/year from a score of 3  - Previously off Xarelto due to previous SAH.   3. AKI CKD Stage II-III - Likely ATN due to shock.  - Trending down now with IABP. Follow daily.   4. H/o LV Thrombus - now recurrent on echo  04/23/17 - Off Xarelto due to previous [place] - Continue heparin.   5. Nausea - Improved. Likely related to low output.   - Covering with PRN zofran.  6. DMII - Cover with SSI + lantus.  6. Hypothyroidism - TFTs OK 04/22/17. No change to current plan.   Tentatively plan DCCV this am. CI remains depressed, but coox stable and UOP much improved.  Patient is critically ill and remains in multiple system organ failure. Prognosis guarded at this point.   Length of Stay: [place], [place]  04/27/2017, 8:24 AM  Advanced Heart Failure Team Pager [phone] (M-F; 7a - 4p)  Please contact [facility] for night-coverage after hours (4p -7a ) and weekends on [email or web address]  Agree. He remains critically ill. On dual pressors and IABP. Co-ox improved. CVP down to 13-14 (was over 20). Remains in AFL with RVR despite IV amio. On heparin   Lying flat in bed JVP to jaw LIG swan Cor tachy reg + s3 Ab soft NT good BS Ext warm 2+edema RFA IABP site ok   He remains critically ill. Continue inotropes and diuresis. Will plan DC-CV today. He realizes there is a risk of embolic event with DC-CV and LV thrombus but restroing NSR may be only way to overcome shock as he is not and VAD candidate (RV dysfunction) or transplant candidate (poor social support). Continue IABP, inotropes and duresis over the weekend. Begin wean next week as tolerated. Continue heparin. Discussed dosing with PharmD personally.  CRITICAL CARE Performed by: Glori Bickers  Total critical care time: 35 minutes  Critical care time was exclusive of separately billable procedures and treating other patients.  Critical care was necessary to treat or prevent imminent or life-threatening deterioration.  Critical care was time spent personally by me (independent of midlevel providers or residents) on the following activities: development of treatment plan with patient and/or surrogate as well as nursing, discussions with  consultants, evaluation of patient's response to treatment, examination of patient, obtaining history from patient or surrogate, ordering and performing treatments and interventions, ordering and review of laboratory studies, ordering and review of radiographic studies, pulse oximetry and re-evaluation of patient's condition.  Glori Bickers, MD  10:25 AM

## 2017-04-27 NOTE — Progress Notes (Signed)
ANTICOAGULATION CONSULT NOTE  Pharmacy Consult for Heparin Indication: atrial fibrillation/ LV thrombus   Allergies  Allergen Reactions  . Bee Venom     Patient Measurements: Height: 6' (182.9 cm) Weight: 224 lb 6.9 oz (101.8 kg) IBW/kg (Calculated) : 77.6  Heparin dosing weight: 99 kg    Vital Signs: Temp: 96.6 F (35.9 C) (04/12 0830) Temp Source: Core (04/12 0800) BP: 123/78 (04/12 0800) Pulse Rate: 118 (04/12 0830)  Labs: Recent Labs    04/25/17 0323  04/26/17 0003 04/26/17 0427 04/26/17 0642 04/26/17 1400 04/27/17 0404  HGB 12.9*  --   --  12.1*  --   --  13.3  HCT 38.4*  --   --  36.6*  --   --  39.8  PLT 179  --   --  190  --   --  157  HEPARINUNFRC  --    < > 0.33  --  0.44  --  0.37  CREATININE  --    < >  --  2.31*  --  2.19* 2.02*   < > = values in this interval not displayed.    Assessment: 46yom with HF admitted in cardiogenic shock and volume overload. Started on heparin gtt for AFib with RVR.  No AC as outpatient - previously on rivaroxaban but fell hit head and developed [facility] 11/18. Last head CT showed no increase hemorrhage. ECHO 4/8 showed LVEF 20-25% and large LV clot. Patient now also has IABP.   Heparin level this morning resulted as 0.37 and within goal range with LV thrombus and IABP, hemoglobin and platelets stable. No issues with bleeding documented or issues with drip overnight per nurse. Noted cardiology tentative plans for DCCV today.   Goal of Therapy:  Heparin level 0.2-0.5 units/ml Monitor platelets by anticoagulation protocol: Yes   Plan:  Continue heparin at 1400 units/hour Monitor daily heparin level and CBC, s/sx bleeding   Blake Divine, Pharm.D. PGY1 Pharmacy Resident 04/27/2017 9:12 AM Main Pharmacy: [phone]

## 2017-04-27 NOTE — Procedures (Signed)
Electrical Cardioversion Procedure Note Benjamin Sherman [id number] 1970/06/30  Procedure: Electrical Cardioversion Indications:  Atrial Flutter  Procedure Details Consent: Risks of procedure as well as the alternatives and risks of each were explained to the (patient/caregiver).  Consent for procedure obtained. Time Out: Verified patient identification, verified procedure, site/side was marked, verified correct patient position, special equipment/implants available, medications/allergies/relevent history reviewed, required imaging and test results available.  Performed  Patient placed on cardiac monitor, pulse oximetry, supplemental oxygen as necessary.  Sedation given per anesthesia Pacer pads placed anterior and posterior chest.  Cardioverted 1 time(s).  Cardioverted at 150J.  Evaluation Findings: Post procedure EKG shows: NSR Complications: None Patient did tolerate procedure well.   [name] MD 04/27/2017, 10:26 AM

## 2017-04-27 NOTE — Transfer of Care (Signed)
Immediate Anesthesia Transfer of Care Note  Patient: Benjamin Sherman  Procedure(s) Performed: CARDIOVERSION (N/A )  Patient Location: SICU  Anesthesia Type:General  Level of Consciousness: awake, alert  and sedated  Airway & Oxygen Therapy: Patient connected to face mask oxygen  Post-op Assessment: Post -op Vital signs reviewed and stable  Post vital signs: stable  Last Vitals:  Vitals Value Taken Time  BP 107/64 04/27/2017 10:21 AM  Temp 35.9 C 04/27/2017 10:23 AM  Pulse 94 04/27/2017 10:23 AM  Resp 23 04/27/2017 10:23 AM  SpO2 100 % 04/27/2017 10:23 AM  Vitals shown include unvalidated device data.  Last Pain:  Vitals:   04/27/17 0800  TempSrc: Core  PainSc: 0-No pain         Complications: No apparent anesthesia complications

## 2017-04-27 NOTE — Anesthesia Postprocedure Evaluation (Signed)
Anesthesia Post Note  Patient: Benjamin Sherman  Procedure(s) Performed: CARDIOVERSION (N/A )     Patient location during evaluation: ICU Anesthesia Type: General Level of consciousness: sedated and patient cooperative Pain management: pain level controlled Vital Signs Assessment: post-procedure vital signs reviewed and stable Respiratory status: spontaneous breathing Cardiovascular status: stable Anesthetic complications: no    Last Vitals:  Vitals:   04/27/17 1200 04/27/17 1215  BP: 115/67   Pulse: 95 96  Resp: (!) 0 (!) 0  Temp: (!) 35.9 C (!) 35.9 C  SpO2: 97% 96%    Last Pain:  Vitals:   04/27/17 1200  TempSrc: Core  PainSc: 0-No pain                 Lewie Loron

## 2017-04-28 LAB — BASIC METABOLIC PANEL
Anion gap: 11 (ref 5–15)
BUN: 36 mg/dL — ABNORMAL HIGH (ref 6–20)
CO2: 27 mmol/L (ref 22–32)
Calcium: 8.9 mg/dL (ref 8.9–10.3)
Chloride: 89 mmol/L — ABNORMAL LOW (ref 101–111)
Creatinine, Ser: 2.02 mg/dL — ABNORMAL HIGH (ref 0.61–1.24)
GFR calc Af Amer: 43 mL/min — ABNORMAL LOW (ref >60)
GFR calc non Af Amer: 37 mL/min — ABNORMAL LOW (ref >60)
Glucose, Bld: 170 mg/dL — ABNORMAL HIGH (ref 65–99)
Potassium: 4.2 mmol/L (ref 3.5–5.1)
Sodium: 127 mmol/L — ABNORMAL LOW (ref 135–145)

## 2017-04-28 LAB — CBC
HCT: 38.6 % — ABNORMAL LOW (ref 39.0–52.0)
Hemoglobin: 12.9 g/dL — ABNORMAL LOW (ref 13.0–17.0)
MCH: 28.2 pg (ref 26.0–34.0)
MCHC: 33.4 g/dL (ref 30.0–36.0)
MCV: 84.3 fL (ref 78.0–100.0)
Platelets: 132 10*3/uL — ABNORMAL LOW (ref 150–400)
RBC: 4.58 MIL/uL (ref 4.22–5.81)
RDW: 15.8 % — ABNORMAL HIGH (ref 11.5–15.5)
WBC: 9 10*3/uL (ref 4.0–10.5)

## 2017-04-28 LAB — GLUCOSE, CAPILLARY
Glucose-Capillary: 169 mg/dL — ABNORMAL HIGH (ref 65–99)
Glucose-Capillary: 196 mg/dL — ABNORMAL HIGH (ref 65–99)
Glucose-Capillary: 195 mg/dL — ABNORMAL HIGH (ref 65–99)
Glucose-Capillary: 150 mg/dL — ABNORMAL HIGH (ref 65–99)

## 2017-04-28 LAB — COOXEMETRY PANEL
Carboxyhemoglobin: 1.3 % (ref 0.5–1.5)
Methemoglobin: 1.2 % (ref 0.0–1.5)
O2 Saturation: 60.4 %
Total hemoglobin: 14 g/dL (ref 12.0–16.0)

## 2017-04-28 LAB — MAGNESIUM: Magnesium: 1.9 mg/dL (ref 1.7–2.4)

## 2017-04-28 LAB — HEPARIN LEVEL (UNFRACTIONATED): Heparin Unfractionated: 0.41 IU/mL (ref 0.30–0.70)

## 2017-04-28 MED ORDER — MAGNESIUM HYDROXIDE 400 MG/5ML PO SUSP
30.0000 mL | Freq: Every day | ORAL | Status: DC | PRN
  Administered 2017-04-28 – 2017-05-01 (×3): 30 mL via ORAL
  Filled 2017-04-28 (×3): qty 30

## 2017-04-28 NOTE — Progress Notes (Signed)
ANTICOAGULATION CONSULT NOTE  Pharmacy Consult for Heparin Indication: atrial fibrillation/ LV thrombus   Allergies  Allergen Reactions  . Bee Venom     Patient Measurements: Height: 6' (182.9 cm) Weight: 220 lb 14.4 oz (100.2 kg) IBW/kg (Calculated) : 77.6  Heparin dosing weight: 99 kg    Vital Signs: Temp: 96.8 F (36 C) (04/13 1245) Temp Source: Core (04/13 1200) BP: 135/79 (04/13 1245) Pulse Rate: 93 (04/13 1245)  Labs: Recent Labs    04/26/17 0427 04/26/17 0642 04/26/17 1400 04/27/17 0404 04/28/17 0421  HGB 12.1*  --   --  13.3 12.9*  HCT 36.6*  --   --  39.8 38.6*  PLT 190  --   --  157 132*  HEPARINUNFRC  --  0.44  --  0.37 0.41  CREATININE 2.31*  --  2.19* 2.02* 2.02*    Assessment: 46yom with HF admitted in cardiogenic shock and volume overload. Started on heparin gtt for AFib with RVR.  No AC as outpatient - previously on rivaroxaban but fell hit head and developed [facility] 11/18. Last head CT showed no increase hemorrhage. ECHO 4/8 showed LVEF 20-25% and large LV clot. Patient now also has IABP.   Heparin level this morning resulted as 0.4 and within goal range with LV thrombus and IABP, hemoglobin and platelets stable. No issues with bleeding documented or issues with drip overnight per nurse.   Goal of Therapy:  Heparin level 0.2-0.5 units/ml Monitor platelets by anticoagulation protocol: Yes   Plan:  Continue heparin at 1400 units/hour Monitor daily heparin level and CBC, s/sx bleeding   Sheppard Coil PharmD., BCPS Clinical Pharmacist 04/28/2017 1:52 PM

## 2017-04-28 NOTE — Plan of Care (Signed)
  Problem: Education: Goal: Knowledge of General Education information will improve Outcome: Progressing   Problem: Clinical Measurements: Goal: Ability to maintain clinical measurements within normal limits will improve Outcome: Progressing Goal: Will remain free from infection Outcome: Progressing Goal: Diagnostic test results will improve Outcome: Progressing Goal: Respiratory complications will improve Outcome: Progressing Goal: Cardiovascular complication will be avoided Outcome: Progressing   Problem: Elimination: Goal: Will not experience complications related to urinary retention Outcome: Progressing   Problem: Pain Managment: Goal: General experience of comfort will improve Outcome: Progressing   Problem: Safety: Goal: Ability to remain free from injury will improve Outcome: Progressing   Problem: Skin Integrity: Goal: Risk for impaired skin integrity will decrease Outcome: Progressing   Problem: Education: Goal: Understanding of CV disease, CV risk reduction, and recovery process will improve Outcome: Progressing   Problem: Cardiovascular: Goal: Ability to achieve and maintain adequate cardiovascular perfusion will improve Outcome: Progressing Note:  With support from medications and IABP   Problem: Health Behavior/Discharge Planning: Goal: Ability to safely manage health-related needs after discharge will improve Outcome: Progressing   Problem: Education: Goal: Knowledge of disease or condition will improve Outcome: Progressing Goal: Understanding of medication regimen will improve Outcome: Progressing   Problem: Cardiac: Goal: Ability to achieve and maintain adequate cardiopulmonary perfusion will improve Outcome: Progressing   Problem: Health Behavior/Discharge Planning: Goal: Ability to safely manage health-related needs after discharge will improve Outcome: Progressing   Problem: Education: Goal: Ability to demonstrate management of disease  process will improve Outcome: Progressing Goal: Ability to verbalize understanding of medication therapies will improve Outcome: Progressing   Problem: Cardiac: Goal: Ability to achieve and maintain adequate cardiopulmonary perfusion will improve Outcome: Progressing

## 2017-04-28 NOTE — Progress Notes (Signed)
Advanced Heart Failure Rounding Note   Subjective:    Admitted 04/21/17 with cardiogenic shock and atrial flutter.   Central line placed. Initial co-ox 39%.  IABP placed 04/25/17 with persistent cardiogenic shock.   DCCV atrial flutter 4/12.  He remains in NSR today on amiodarone.   Coox 60% on milrinone 0.5 mcg/kg/min and NE 5 and IABP 1:1  He diuresed well yesterday, weight down 4 lbs.   Echo 4/8 reviewed personally LVEF 20-25% with LV apical clot. RV mild to moderately down.   Creatinine stable 2.02 today.   Swan numbers (personally reviewed) PA 43/26 CVP 14 CI 2.0 Co-ox 60%  Objective:   Weight Range:  Vital Signs:   Temp:  [96.6 F (35.9 C)-97.9 F (36.6 C)] 97 F (36.1 C) (04/13 1015) Pulse Rate:  [94-174] 137 (04/13 1015) Resp:  [5-33] 23 (04/13 1015) BP: (90-144)/(46-95) 136/75 (04/13 1000) SpO2:  [87 %-100 %] 87 % (04/13 1015) Arterial Line BP: (81-116)/(70-92) 102/78 (04/13 1015) Weight:  [220 lb 14.4 oz (100.2 kg)] 220 lb 14.4 oz (100.2 kg) (04/13 0500) Last BM Date: 04/24/17  Weight change: Filed Weights   04/26/17 0445 04/27/17 0628 04/28/17 0500  Weight: 237 lb 7 oz (107.7 kg) 224 lb 6.9 oz (101.8 kg) 220 lb 14.4 oz (100.2 kg)   Intake/Output:   Intake/Output Summary (Last 24 hours) at 04/28/2017 1224 Last data filed at 04/28/2017 1000 Gross per 24 hour  Intake 2315.2 ml  Output 4000 ml  Net -1684.8 ml    Physical Exam   General: NAD Neck: JVP 14 cm, no thyromegaly or thyroid nodule.  Lungs: Mild crackles at bases. CV: Nondisplaced PMI.  Heart regular S1/S2, no S3/S4, no murmur.  1+ edema to knees.   Abdomen: Soft, nontender, no hepatosplenomegaly, no distention.  Skin: Intact without lesions or rashes.  Neurologic: Alert and oriented x 3.  Psych: Normal affect. Extremities: No clubbing or cyanosis.  HEENT: Normal.    Telemetry    NSR 80s (personally reviewed)  EKG   No new tracings.    Labs    Basic Metabolic  Panel: Recent Labs  Lab 04/25/17 0624 04/26/17 0427 04/26/17 1400 04/27/17 0404 04/28/17 0421  NA 127* 129* 128* 128* 127*  K 3.7 3.4* 3.6 3.8 4.2  CL 91* 92* 91* 91* 89*  CO2 22 24 24 25 27   GLUCOSE 151* 178* 179* 158* 170*  BUN 48* 49* 46* 37* 36*  CREATININE 2.39* 2.31* 2.19* 2.02* 2.02*  CALCIUM 8.7* 8.7* 8.9 9.0 8.9  MG 2.2 2.0 1.9 2.2 1.9   Liver Function Tests: Recent Labs  Lab 04/23/17 0416  AST 295*  ALT 233*  ALKPHOS 124  BILITOT 1.5*  PROT 7.2  ALBUMIN 2.4*   No results for input(s): LIPASE, AMYLASE in the last 168 hours. No results for input(s): AMMONIA in the last 168 hours.  CBC: Recent Labs  Lab 04/24/17 0311 04/25/17 0323 04/26/17 0427 04/27/17 0404 04/28/17 0421  WBC 9.1 9.0 8.4 9.8 9.0  HGB 12.9* 12.9* 12.1* 13.3 12.9*  HCT 39.3 38.4* 36.6* 39.8 38.6*  MCV 86.6 85.9 84.9 85.0 84.3  PLT 209 179 190 157 132*   Cardiac Enzymes: Recent Labs  Lab 04/21/17 2023 04/22/17 0106  TROPONINI 0.06* 0.06*   BNP: BNP (last 3 results) Recent Labs    11/06/16 1621 03/25/17 1401 04/21/17 1212  BNP 961.6* 835.4* 483.0*   ProBNP (last 3 results) No results for input(s): PROBNP in the last 8760 hours.  Other  results:  Imaging: No results found.  Medications:    Scheduled Medications: . Chlorhexidine Gluconate Cloth  6 each Topical Daily  . insulin aspart  0-15 Units Subcutaneous TID WC  . insulin aspart  0-5 Units Subcutaneous QHS  . insulin glargine  8 Units Subcutaneous QHS  . sodium chloride flush  10-40 mL Intracatheter Q12H  . sodium chloride flush  3 mL Intravenous Q12H  . sodium chloride flush  3 mL Intravenous Q12H  . spironolactone  12.5 mg Oral Daily    Infusions: . sodium chloride    . sodium chloride 10 mL/hr at 04/27/17 1900  . sodium chloride    . sodium chloride    . amiodarone 30 mg/hr (04/28/17 0400)  . furosemide (LASIX) infusion 20 mg/hr (04/27/17 2355)  . heparin 1,400 Units/hr (04/28/17 0200)  . milrinone 0.5  mcg/kg/min (04/28/17 1004)  . norepinephrine (LEVOPHED) Adult infusion 5 mcg/min (04/27/17 0800)    PRN Medications:    Assessment:   Benjamin Sherman is a 47 y.o. 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 eosinophilic cardiomyopathy, LV thrombus, hypothyroidism, DM2, CKD Stage II-III and traumatic [facility] 11/18 after fall.   Admitted with cardiogenic shock.  Plan/Discussion:    1. Acute on chronic systolic HF -> cardiogenic shock: ECHO 11/2016 EF 25-30% CMRI EF 22%. Bedside echo in HF clinic EF 20% in 3/19.  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 Did not respond to steroids. Admitted 4/6 with cardiogenic shock and volume overload co-ox 38%. Echo 04/23/17: LVEF 20-25% with large LV clot, RV mild to moderately down. Coox 60% on milrinone 0.5 mcg/kg/min and NE 5. IABP at 1:1 with good waveforms.  CVP 14 off swan. He diuresed well yesterday with Lasix gtt.  - Continue milrinone 0.5 + norepinephrine 5.  Add digoxin.  - Will continue IABP 1:1 for now while we continue to diurese him.  - Continue lasix gtt 20 mg/hr.  - Continue spiro 12.5 mg daily. - No ACE/ARB/b-blocker with shock - Poor LVAD candidate with RV failure and renal failure.  Poor transplant candidate with lack of social support. Will try to fully diurese and then wean off support.  2. Atrial flutter: New onset. S/p DCCV to NSR on 4/12.  - Continue amiodarone and heparin gtts for now.  - Previously off Xarelto due to [facility].  3. AKI CKD Stage II-III: Likely ATN due to shock. Creatinine stable to down-trending down now with IABP. Follow daily.  4. H/o LV Thrombus - now recurrent on echo 04/23/17: Off Xarelto due to previous SAH.  Continue heparin.  5. DMII - Cover with SSI + lantus. 6. Hypothyroidism: TFTs OK 04/22/17. No change to current plan.   CRITICAL CARE Performed by: Marca Ancona  Total critical care time: 35 minutes  Critical care time was exclusive of separately billable procedures  and treating other patients.  Critical care was necessary to treat or prevent imminent or life-threatening deterioration.  Critical care was time spent personally by me on the following activities: development of treatment plan with patient and/or surrogate as well as nursing, discussions with consultants, evaluation of patient's response to treatment, examination of patient, obtaining history from patient or surrogate, ordering and performing treatments and interventions, ordering and review of laboratory studies, ordering and review of radiographic studies, pulse oximetry and re-evaluation of patient's condition.   Marca Ancona 04/28/2017 12:31 PM

## 2017-04-29 LAB — GLUCOSE, CAPILLARY
Glucose-Capillary: 155 mg/dL — ABNORMAL HIGH (ref 65–99)
Glucose-Capillary: 205 mg/dL — ABNORMAL HIGH (ref 65–99)
Glucose-Capillary: 133 mg/dL — ABNORMAL HIGH (ref 65–99)
Glucose-Capillary: 156 mg/dL — ABNORMAL HIGH (ref 65–99)

## 2017-04-29 LAB — BASIC METABOLIC PANEL
Anion gap: 12 (ref 5–15)
BUN: 27 mg/dL — ABNORMAL HIGH (ref 6–20)
CO2: 31 mmol/L (ref 22–32)
Calcium: 9.6 mg/dL (ref 8.9–10.3)
Chloride: 87 mmol/L — ABNORMAL LOW (ref 101–111)
Creatinine, Ser: 1.73 mg/dL — ABNORMAL HIGH (ref 0.61–1.24)
GFR calc Af Amer: 52 mL/min — ABNORMAL LOW (ref >60)
GFR calc non Af Amer: 45 mL/min — ABNORMAL LOW (ref >60)
Glucose, Bld: 160 mg/dL — ABNORMAL HIGH (ref 65–99)
Potassium: 4 mmol/L (ref 3.5–5.1)
Sodium: 130 mmol/L — ABNORMAL LOW (ref 135–145)

## 2017-04-29 LAB — CBC
HCT: 41 % (ref 39.0–52.0)
Hemoglobin: 13.8 g/dL (ref 13.0–17.0)
MCH: 28.8 pg (ref 26.0–34.0)
MCHC: 33.7 g/dL (ref 30.0–36.0)
MCV: 85.4 fL (ref 78.0–100.0)
Platelets: 137 10*3/uL — ABNORMAL LOW (ref 150–400)
RBC: 4.8 MIL/uL (ref 4.22–5.81)
RDW: 15.9 % — ABNORMAL HIGH (ref 11.5–15.5)
WBC: 8.2 10*3/uL (ref 4.0–10.5)

## 2017-04-29 LAB — COOXEMETRY PANEL
Carboxyhemoglobin: 1.8 % — ABNORMAL HIGH (ref 0.5–1.5)
Methemoglobin: 0.8 % (ref 0.0–1.5)
O2 Saturation: 67.3 %
Total hemoglobin: 13.1 g/dL (ref 12.0–16.0)

## 2017-04-29 LAB — HEPARIN LEVEL (UNFRACTIONATED): Heparin Unfractionated: 0.42 IU/mL (ref 0.30–0.70)

## 2017-04-29 LAB — MAGNESIUM: Magnesium: 2.1 mg/dL (ref 1.7–2.4)

## 2017-04-29 MED ORDER — MAGNESIUM SULFATE 2 GM/50ML IV SOLN
2.0000 g | Freq: Once | INTRAVENOUS | Status: AC
  Administered 2017-04-29: 2 g via INTRAVENOUS
  Filled 2017-04-29: qty 50

## 2017-04-29 MED ORDER — DIGOXIN 125 MCG PO TABS
0.1250 mg | ORAL_TABLET | Freq: Every day | ORAL | Status: DC
  Administered 2017-04-29 – 2017-05-16 (×17): 0.125 mg via ORAL
  Filled 2017-04-29 (×17): qty 1

## 2017-04-29 MED ORDER — TORSEMIDE 20 MG PO TABS
40.0000 mg | ORAL_TABLET | Freq: Every day | ORAL | Status: DC
  Administered 2017-04-30 – 2017-05-03 (×4): 40 mg via ORAL
  Filled 2017-04-29 (×4): qty 2

## 2017-04-29 MED ORDER — MILRINONE LACTATE IN DEXTROSE 20-5 MG/100ML-% IV SOLN
0.3750 ug/kg/min | INTRAVENOUS | Status: DC
  Administered 2017-04-29 – 2017-05-10 (×22): 0.375 ug/kg/min via INTRAVENOUS
  Administered 2017-05-10: 11:00:00 via INTRAVENOUS
  Administered 2017-05-10 – 2017-05-14 (×7): 0.375 ug/kg/min via INTRAVENOUS
  Administered 2017-05-15: .375 ug/kg/min via INTRAVENOUS
  Administered 2017-05-15: 0.375 ug/kg/min via INTRAVENOUS
  Filled 2017-04-29 (×39): qty 100

## 2017-04-29 NOTE — Progress Notes (Signed)
Patient ID: Benjamin Sherman, male   DOB: 22-Jun-1970, 47 y.o.   MRN: [id number]    Advanced Heart Failure Rounding Note   Subjective:    Admitted 04/21/17 with cardiogenic shock and atrial flutter.   Central line placed. Initial co-ox 39%.  IABP placed 04/25/17 with persistent cardiogenic shock.   DCCV atrial flutter 4/12.  He remains in NSR today on amiodarone.   Coox 67% on milrinone 0.5 mcg/kg/min and NE 5 and IABP 1:1  He diuresed very well yesterday, weight down ?12 lbs.   Echo 4/8: LVEF 20-25% with LV apical clot. RV mild to moderately down.   Creatinine 2.02 => 1.73.   [name] numbers (personally reviewed) RA 8  PA 46/27 PCWP 19 CI 2.2  Co-ox 67%  Objective:   Weight Range:  Vital Signs:   Temp:  [96.8 F (36 C)-97.7 F (36.5 C)] 97.2 F (36.2 C) (04/14 1000) Pulse Rate:  [91-174] 92 (04/14 1000) Resp:  [8-28] 19 (04/14 1000) BP: (110-145)/(57-84) 138/78 (04/14 1000) SpO2:  [93 %-100 %] 98 % (04/14 1000) Arterial Line BP: (88-121)/(63-89) 121/71 (04/14 1000) Weight:  [208 lb 1.8 oz (94.4 kg)] 208 lb 1.8 oz (94.4 kg) (04/14 0600) Last BM Date: 04/21/17  Weight change: Filed Weights   04/27/17 0628 04/28/17 0500 04/29/17 0600  Weight: 224 lb 6.9 oz (101.8 kg) 220 lb 14.4 oz (100.2 kg) 208 lb 1.8 oz (94.4 kg)   Intake/Output:   Intake/Output Summary (Last 24 hours) at 04/29/2017 1055 Last data filed at 04/29/2017 1000 Gross per 24 hour  Intake 2498.8 ml  Output 8135 ml  Net -5636.2 ml    Physical Exam   General: NAD Neck: JVP 8 cm, no thyromegaly or thyroid nodule.  Lungs: Clear to auscultation bilaterally with normal respiratory effort. CV: Heart regular S1/S2, no S3/S4, no murmur.  No peripheral edema.  Peripheral pulses dopplerable.   Abdomen: Soft, nontender, no hepatosplenomegaly, no distention.  Skin: Intact without lesions or rashes.  Neurologic: Alert and oriented x 3.  Psych: Normal affect. Extremities: No clubbing or cyanosis. IABP site  benign HEENT: Normal.    Telemetry    NSR 80s (personally reviewed)  EKG   No new tracings.    Labs    Basic Metabolic Panel: Recent Labs  Lab 04/26/17 0427 04/26/17 1400 04/27/17 0404 04/28/17 0421 04/29/17 0513  NA 129* 128* 128* 127* 130*  K 3.4* 3.6 3.8 4.2 4.0  CL 92* 91* 91* 89* 87*  CO2 24 24 25 27 31   GLUCOSE 178* 179* 158* 170* 160*  BUN 49* 46* 37* 36* 27*  CREATININE 2.31* 2.19* 2.02* 2.02* 1.73*  CALCIUM 8.7* 8.9 9.0 8.9 9.6  MG 2.0 1.9 2.2 1.9 2.1   Liver Function Tests: Recent Labs  Lab 04/23/17 0416  AST 295*  ALT 233*  ALKPHOS 124  BILITOT 1.5*  PROT 7.2  ALBUMIN 2.4*   No results for input(s): LIPASE, AMYLASE in the last 168 hours. No results for input(s): AMMONIA in the last 168 hours.  CBC: Recent Labs  Lab 04/25/17 0323 04/26/17 0427 04/27/17 0404 04/28/17 0421 04/29/17 0513  WBC 9.0 8.4 9.8 9.0 8.2  HGB 12.9* 12.1* 13.3 12.9* 13.8  HCT 38.4* 36.6* 39.8 38.6* 41.0  MCV 85.9 84.9 85.0 84.3 85.4  PLT 179 190 157 132* 137*   Cardiac Enzymes: No results for input(s): CKTOTAL, CKMB, CKMBINDEX, TROPONINI in the last 168 hours. BNP: BNP (last 3 results) Recent Labs    11/06/16 1621 03/25/17 1401 04/21/17  1212  BNP 961.6* 835.4* 483.0*   ProBNP (last 3 results) No results for input(s): PROBNP in the last 8760 hours.  Other results:  Imaging: No results found.  Medications:    Scheduled Medications: . Chlorhexidine Gluconate Cloth  6 each Topical Daily  . insulin aspart  0-15 Units Subcutaneous TID WC  . insulin aspart  0-5 Units Subcutaneous QHS  . insulin glargine  8 Units Subcutaneous QHS  . sodium chloride flush  10-40 mL Intracatheter Q12H  . sodium chloride flush  3 mL Intravenous Q12H  . spironolactone  12.5 mg Oral Daily  . [START ON 04/30/2017] torsemide  40 mg Oral Daily    Infusions: . sodium chloride 10 mL/hr at 04/28/17 2100  . sodium chloride 10 mL/hr at 04/29/17 1000  . amiodarone 30 mg/hr  (04/29/17 1000)  . heparin 1,400 Units/hr (04/29/17 1000)  . magnesium sulfate 1 - 4 g bolus IVPB    . milrinone    . norepinephrine (LEVOPHED) Adult infusion 5 mcg/min (04/29/17 1000)    PRN Medications:    Assessment:   Benjamin Sherman is a 47 y.o. 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 eosinophilic cardiomyopathy, LV thrombus, hypothyroidism, DM2, CKD Stage II-III and traumatic [facility] 11/18 after fall.   Admitted with cardiogenic shock.  Plan/Discussion:    1. Acute on chronic systolic HF -> cardiogenic shock: ECHO 11/2016 EF 25-30% CMRI EF 22%. Bedside echo in HF clinic EF 20% in 3/19.  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 Did not respond to steroids. Admitted 4/6 with cardiogenic shock and volume overload co-ox 38%. Echo 04/23/17: LVEF 20-25% with large LV clot, RV mild to moderately down. Coox 67% on milrinone 0.5 mcg/kg/min and NE 5. IABP at 1:1 with good waveforms.  CVP 8 off swan. He diuresed well yesterday again with Lasix gtt.  - Will decrease milrinone to 0.375 today and try to wean off norepinephrine today.  Continue IABP today, will aim to wean off tomorrow as long as he is stable.   - Keep Swan while IABP in, discontinue if he is doing well tomorrow and IABP coming out.   - Will continue IABP 1:1 for now while we continue to diurese him.  Heparin gtt for IABP.  - Stop Lasix gtt, can start torsemide 40 mg daily tomorrow.  - Start digoxin 0.125.  - Continue spiro 12.5 mg daily. - No ACE/ARB/b-blocker with shock - Poor LVAD candidate with RV failure and renal failure.  Poor transplant candidate with lack of social support. Will try to fully diurese and then wean off support.  2. Atrial flutter: New onset. S/p DCCV to NSR on 4/12.  - Continue amiodarone and heparin gtts for now.  - Previously off Xarelto due to [facility].  3. AKI CKD Stage II-III: Likely ATN due to shock. Creatinine trending down.  4. H/o LV Thrombus - now recurrent  on echo 04/23/17: Off Xarelto due to previous SAH.  Continue heparin.  5. DMII - Cover with SSI + lantus. 6. Hypothyroidism: TFTs OK 04/22/17. No change to current plan.   CRITICAL CARE Performed by: Marca Ancona  Total critical care time: 35 minutes  Critical care time was exclusive of separately billable procedures and treating other patients.  Critical care was necessary to treat or prevent imminent or life-threatening deterioration.  Critical care was time spent personally by me on the following activities: development of treatment plan with patient and/or surrogate as well as nursing, discussions with  consultants, evaluation of patient's response to treatment, examination of patient, obtaining history from patient or surrogate, ordering and performing treatments and interventions, ordering and review of laboratory studies, ordering and review of radiographic studies, pulse oximetry and re-evaluation of patient's condition.   Marca Ancona 04/29/2017 10:55 AM

## 2017-04-29 NOTE — Plan of Care (Signed)
  Problem: Education: Goal: Knowledge of General Education information will improve Outcome: Progressing   Problem: Health Behavior/Discharge Planning: Goal: Ability to manage health-related needs will improve Outcome: Progressing   Problem: Clinical Measurements: Goal: Ability to maintain clinical measurements within normal limits will improve Outcome: Progressing Goal: Will remain free from infection Outcome: Progressing Goal: Diagnostic test results will improve Outcome: Progressing Goal: Respiratory complications will improve Outcome: Progressing Goal: Cardiovascular complication will be avoided Outcome: Progressing   Problem: Activity: Goal: Risk for activity intolerance will decrease Outcome: Progressing   Problem: Coping: Goal: Level of anxiety will decrease Outcome: Progressing   Problem: Elimination: Goal: Will not experience complications related to bowel motility Outcome: Progressing Goal: Will not experience complications related to urinary retention Outcome: Progressing   Problem: Pain Managment: Goal: General experience of comfort will improve Outcome: Progressing   Problem: Safety: Goal: Ability to remain free from injury will improve Outcome: Progressing   Problem: Skin Integrity: Goal: Risk for impaired skin integrity will decrease Outcome: Progressing   Problem: Education: Goal: Understanding of CV disease, CV risk reduction, and recovery process will improve Outcome: Progressing   Problem: Cardiovascular: Goal: Ability to achieve and maintain adequate cardiovascular perfusion will improve Outcome: Progressing   Problem: Health Behavior/Discharge Planning: Goal: Ability to safely manage health-related needs after discharge will improve Outcome: Progressing   Problem: Education: Goal: Knowledge of disease or condition will improve Outcome: Progressing Goal: Understanding of medication regimen will improve Outcome: Progressing   Problem:  Activity: Goal: Ability to tolerate increased activity will improve Outcome: Progressing   Problem: Cardiac: Goal: Ability to achieve and maintain adequate cardiopulmonary perfusion will improve Outcome: Progressing Note:  Pt remains in SR   Problem: Health Behavior/Discharge Planning: Goal: Ability to safely manage health-related needs after discharge will improve Outcome: Progressing   Problem: Education: Goal: Ability to demonstrate management of disease process will improve Outcome: Progressing Goal: Ability to verbalize understanding of medication therapies will improve Outcome: Progressing   Problem: Cardiac: Goal: Ability to achieve and maintain adequate cardiopulmonary perfusion will improve Outcome: Progressing Note:  Weaned off Levo and Mil decreased today, IABP still 1:1, pt is stable with adjustments

## 2017-04-29 NOTE — Progress Notes (Signed)
ANTICOAGULATION CONSULT NOTE  Pharmacy Consult for Heparin Indication: atrial fibrillation/ LV thrombus   Allergies  Allergen Reactions  . Bee Venom     Patient Measurements: Height: 6' (182.9 cm) Weight: 208 lb 1.8 oz (94.4 kg) IBW/kg (Calculated) : 77.6  Heparin dosing weight: 99 kg    Vital Signs: Temp: 97.2 F (36.2 C) (04/14 1000) BP: 138/78 (04/14 1000) Pulse Rate: 92 (04/14 1000)  Labs: Recent Labs    04/27/17 0404 04/28/17 0421 04/29/17 0513  HGB 13.3 12.9* 13.8  HCT 39.8 38.6* 41.0  PLT 157 132* 137*  HEPARINUNFRC 0.37 0.41 0.42  CREATININE 2.02* 2.02* 1.73*    Assessment: 46yom with HF admitted in cardiogenic shock and volume overload. Started on heparin gtt for AFib with RVR.  No AC as outpatient - previously on rivaroxaban but fell hit head and developed [facility] 11/18. Last head CT showed no increase hemorrhage. ECHO 4/8 showed LVEF 20-25% and large LV clot. Patient now also has IABP.   Heparin level this morning resulted as 0.4 and within goal range with LV thrombus and IABP, hemoglobin and platelets stable. No issues with bleeding documented or issues with drip overnight per nurse.   Goal of Therapy:  Heparin level 0.2-0.5 units/ml Monitor platelets by anticoagulation protocol: Yes   Plan:  Continue heparin at 1400 units/hour Monitor daily heparin level and CBC, s/sx bleeding   Sheppard Coil PharmD., BCPS Clinical Pharmacist 04/29/2017 10:18 AM

## 2017-04-30 ENCOUNTER — Encounter (HOSPITAL_COMMUNITY): Payer: Self-pay | Admitting: Internal Medicine

## 2017-04-30 LAB — GLUCOSE, CAPILLARY
Glucose-Capillary: 188 mg/dL — ABNORMAL HIGH (ref 65–99)
Glucose-Capillary: 111 mg/dL — ABNORMAL HIGH (ref 65–99)
Glucose-Capillary: 220 mg/dL — ABNORMAL HIGH (ref 65–99)
Glucose-Capillary: 112 mg/dL — ABNORMAL HIGH (ref 65–99)
Glucose-Capillary: 232 mg/dL — ABNORMAL HIGH (ref 65–99)

## 2017-04-30 LAB — BASIC METABOLIC PANEL
Anion gap: 9 (ref 5–15)
BUN: 24 mg/dL — ABNORMAL HIGH (ref 6–20)
CO2: 31 mmol/L (ref 22–32)
Calcium: 8.8 mg/dL — ABNORMAL LOW (ref 8.9–10.3)
Chloride: 91 mmol/L — ABNORMAL LOW (ref 101–111)
Creatinine, Ser: 1.72 mg/dL — ABNORMAL HIGH (ref 0.61–1.24)
GFR calc Af Amer: 52 mL/min — ABNORMAL LOW (ref >60)
GFR calc non Af Amer: 45 mL/min — ABNORMAL LOW (ref >60)
Glucose, Bld: 110 mg/dL — ABNORMAL HIGH (ref 65–99)
Potassium: 3.8 mmol/L (ref 3.5–5.1)
Sodium: 131 mmol/L — ABNORMAL LOW (ref 135–145)

## 2017-04-30 LAB — CBC
HCT: 39.5 % (ref 39.0–52.0)
Hemoglobin: 13.4 g/dL (ref 13.0–17.0)
MCH: 29 pg (ref 26.0–34.0)
MCHC: 33.9 g/dL (ref 30.0–36.0)
MCV: 85.5 fL (ref 78.0–100.0)
Platelets: 102 10*3/uL — ABNORMAL LOW (ref 150–400)
RBC: 4.62 MIL/uL (ref 4.22–5.81)
RDW: 15.7 % — ABNORMAL HIGH (ref 11.5–15.5)
WBC: 6.5 10*3/uL (ref 4.0–10.5)

## 2017-04-30 LAB — COOXEMETRY PANEL
Carboxyhemoglobin: 1.7 % — ABNORMAL HIGH (ref 0.5–1.5)
Carboxyhemoglobin: 1.2 % (ref 0.5–1.5)
Methemoglobin: 0.6 % (ref 0.0–1.5)
Methemoglobin: 1.1 % (ref 0.0–1.5)
O2 Saturation: 63.4 %
O2 Saturation: 63.1 %
Total hemoglobin: 13.7 g/dL (ref 12.0–16.0)
Total hemoglobin: 13.6 g/dL (ref 12.0–16.0)

## 2017-04-30 LAB — MAGNESIUM: Magnesium: 2.3 mg/dL (ref 1.7–2.4)

## 2017-04-30 LAB — POCT ACTIVATED CLOTTING TIME
Activated Clotting Time: 164 seconds
Activated Clotting Time: 147 seconds
Activated Clotting Time: 158 seconds

## 2017-04-30 LAB — HEPARIN LEVEL (UNFRACTIONATED): Heparin Unfractionated: 0.54 IU/mL (ref 0.30–0.70)

## 2017-04-30 MED ORDER — POTASSIUM CHLORIDE CRYS ER 20 MEQ PO TBCR
20.0000 meq | EXTENDED_RELEASE_TABLET | Freq: Once | ORAL | Status: AC
  Administered 2017-04-30: 20 meq via ORAL
  Filled 2017-04-30: qty 1

## 2017-04-30 MED ORDER — HEPARIN (PORCINE) IN NACL 100-0.45 UNIT/ML-% IJ SOLN
1550.0000 [IU]/h | INTRAMUSCULAR | Status: DC
  Administered 2017-05-03 – 2017-05-05 (×3): 1400 [IU]/h via INTRAVENOUS
  Administered 2017-05-06 – 2017-05-07 (×2): 1450 [IU]/h via INTRAVENOUS
  Administered 2017-05-08: 1550 [IU]/h via INTRAVENOUS
  Filled 2017-04-30 (×11): qty 250

## 2017-04-30 NOTE — ED Notes (Signed)
Late entry (4/6 15:45) Critical ISTAT Lactic : 3.17mmol/L

## 2017-04-30 NOTE — Progress Notes (Signed)
ANTICOAGULATION CONSULT NOTE  Pharmacy Consult for Heparin Indication: atrial fibrillation/LV thrombus   Allergies  Allergen Reactions  . Bee Venom    Patient Measurements: Height: 6' (182.9 cm) Weight: 205 lb 0.4 oz (93 kg) IBW/kg (Calculated) : 77.6  Heparin dosing weight: 99 kg   Vital Signs: Temp: 98.8 F (37.1 C) (04/15 1900) Temp Source: Core (04/15 1600) BP: 111/74 (04/15 1900) Pulse Rate: 115 (04/15 1900)  Labs: Recent Labs    04/28/17 0421 04/29/17 0513 04/30/17 0507  HGB 12.9* 13.8 13.4  HCT 38.6* 41.0 39.5  PLT 132* 137* 102*  HEPARINUNFRC 0.41 0.42 0.54  CREATININE 2.02* 1.73* 1.72*   Assessment: 53 yoM with h/o HF admitted with volume overload and in cardiogenic shock. Started on heparin gtt for aFib with RVR. No AC PTA - previously on rivaroxaban but fell, hit his head, and developed [facility] 11/18. Last head CT showed no increase hemorrhage. ECHO 4/8 showed LVEF 20-25% and large LV thrombus.   IABP removed at 18:00 per RN. Pharmacy to resume heparin drip 6 hrs after IABP removed. No bleeding noted.  Goal of Therapy:  Heparin level 0.3-0.5 units/ml Monitor platelets by anticoagulation protocol: Yes   Plan:  Resume heparin drip at 1300 units/hour with no bolus at midnight Heparin level in 6 hours Daily heparin level and CBC Monitor for s/sx of bleeding   Loura Back, PharmD, BCPS Clinical Pharmacist Clinical phone for 04/30/2017 until 10p is [phone] After 10p, please call Main Rx at [phone] for assistance 04/30/2017 7:10 PM

## 2017-04-30 NOTE — Progress Notes (Addendum)
Patient ID: Benjamin Sherman, male   DOB: 17-Mar-1970, 47 y.o.   MRN: [id number]    Advanced Heart Failure Rounding Note   Subjective:    Admitted 04/21/17 with cardiogenic shock and atrial flutter.   Central line placed. Initial co-ox 39%.  IABP placed 04/25/17 with persistent cardiogenic shock.   DCCV atrial flutter 4/12.  Maintaining NSR   On milrinone 0.375 mcg. On IABP 1:1. CO-OX  63%.  Yesterday IV lasix stopped.  CVP 5-6.   Denies SOB. No orthopnea or PND.  Feeling much better.    Echo 4/8: LVEF 20-25% with LV apical clot. RV mild to moderately down.   Creatinine 2.02 => 1.73=>1.7 .   Swan numbers (personally reviewed) RA 4 PA 30/19 PCWP CO 4.7  CI 2.1  Co-ox 62%.   Objective:   Weight Range:  Vital Signs:   Temp:  [96.8 F (36 C)-98.2 F (36.8 C)] 97.2 F (36.2 C) (04/15 0800) Pulse Rate:  [91-121] 95 (04/15 0800) Resp:  [4-31] 16 (04/15 0800) BP: (94-141)/(57-98) 110/60 (04/15 0800) SpO2:  [91 %-98 %] 91 % (04/15 0800) Arterial Line BP: (89-123)/(58-74) 101/59 (04/15 0800) Weight:  [205 lb 0.4 oz (93 kg)] 205 lb 0.4 oz (93 kg) (04/15 0500) Last BM Date: 04/21/17  Weight change: Filed Weights   04/28/17 0500 04/29/17 0600 04/30/17 0500  Weight: 220 lb 14.4 oz (100.2 kg) 208 lb 1.8 oz (94.4 kg) 205 lb 0.4 oz (93 kg)   Intake/Output:   Intake/Output Summary (Last 24 hours) at 04/30/2017 0900 Last data filed at 04/30/2017 0800 Gross per 24 hour  Intake 1474.62 ml  Output 2500 ml  Net -1025.38 ml    Physical Exam  CVP 4.  General:  NAD  No resp difficulty. In bed.  HEENT: normal Neck: supple. no JVD. Carotids 2+ bilat; no bruits. No lymphadenopathy or thryomegaly appreciated. RIJ  Cor: PMI laterally displaced. Tachy regular. No rubs, gallops or murmurs. Lungs: clear Abdomen: soft, nontender, nondistended. No hepatosplenomegaly. No bruits or masses. Good bowel sounds. Extremities: no cyanosis, clubbing, rash, R and LLE ted hose. No edema. RUE PICC.  RFA IABP site ok.  Neuro: alert & orientedx3, cranial nerves grossly intact. moves all 4 extremities w/o difficulty. Affect pleasant    Telemetry    NSR 80s (personally reviewed)  EKG   No new tracings.    Labs    Basic Metabolic Panel: Recent Labs  Lab 04/26/17 1400 04/27/17 0404 04/28/17 0421 04/29/17 0513 04/30/17 0507  NA 128* 128* 127* 130* 131*  K 3.6 3.8 4.2 4.0 3.8  CL 91* 91* 89* 87* 91*  CO2 24 25 27 31 31   GLUCOSE 179* 158* 170* 160* 110*  BUN 46* 37* 36* 27* 24*  CREATININE 2.19* 2.02* 2.02* 1.73* 1.72*  CALCIUM 8.9 9.0 8.9 9.6 8.8*  MG 1.9 2.2 1.9 2.1 2.3   Liver Function Tests: No results for input(s): AST, ALT, ALKPHOS, BILITOT, PROT, ALBUMIN in the last 168 hours. No results for input(s): LIPASE, AMYLASE in the last 168 hours. No results for input(s): AMMONIA in the last 168 hours.  CBC: Recent Labs  Lab 04/26/17 0427 04/27/17 0404 04/28/17 0421 04/29/17 0513 04/30/17 0507  WBC 8.4 9.8 9.0 8.2 6.5  HGB 12.1* 13.3 12.9* 13.8 13.4  HCT 36.6* 39.8 38.6* 41.0 39.5  MCV 84.9 85.0 84.3 85.4 85.5  PLT 190 157 132* 137* 102*   Cardiac Enzymes: No results for input(s): CKTOTAL, CKMB, CKMBINDEX, TROPONINI in the last 168 hours. BNP: BNP (  last 3 results) Recent Labs    11/06/16 1621 03/25/17 1401 04/21/17 1212  BNP 961.6* 835.4* 483.0*   ProBNP (last 3 results) No results for input(s): PROBNP in the last 8760 hours.  Other results:  Imaging: No results found.  Medications:    Scheduled Medications: . Chlorhexidine Gluconate Cloth  6 each Topical Daily  . digoxin  0.125 mg Oral Daily  . insulin aspart  0-15 Units Subcutaneous TID WC  . insulin aspart  0-5 Units Subcutaneous QHS  . insulin glargine  8 Units Subcutaneous QHS  . sodium chloride flush  10-40 mL Intracatheter Q12H  . sodium chloride flush  3 mL Intravenous Q12H  . spironolactone  12.5 mg Oral Daily  . torsemide  40 mg Oral Daily    Infusions: . sodium chloride 10  mL/hr at 04/30/17 0800  . sodium chloride 10 mL/hr at 04/30/17 0600  . amiodarone 30 mg/hr (04/30/17 0800)  . heparin 1,400 Units/hr (04/30/17 0800)  . milrinone 0.375 mcg/kg/min (04/30/17 0800)  . norepinephrine (LEVOPHED) Adult infusion Stopped (04/29/17 1305)    PRN Medications:    Assessment:   Benjamin Sherman is a 47 y.o. 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 eosinophilic cardiomyopathy, LV thrombus, hypothyroidism, DM2, CKD Stage II-III and traumatic [facility] 11/18 after fall.   Admitted with cardiogenic shock.  Plan/Discussion:    1. Acute on chronic systolic HF -> cardiogenic shock: ECHO 11/2016 EF 25-30% CMRI EF 22%. Bedside echo in HF clinic EF 20% in 3/19.  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 Did not respond to steroids. Admitted 4/6 with cardiogenic shock and volume overload co-ox 38%. Echo 04/23/17: LVEF 20-25% with large LV clot, RV mild to moderately down. - Off norepi. On milrinone 0.375 mcg. Co-ox 63%.  - CVP 4. On torsemide 40 mg daily.  - Anticipate weaning IABP today.  - Continue torsemide 40 mg daily tomorrow.  - Continue  digoxin 0.125.  - Continue spiro 12.5 mg daily. - No ACE/ARB/b-blocker with shock - Poor LVAD candidate with RV failure and renal failure.  Poor transplant candidate with lack of social support.  2. Atrial flutter: New onset. S/p DCCV to NSR on 4/12.  - Maintaining NSR  - Continue amiodarone and heparin gtts for now.  - Previously off Xarelto due to [facility].  3. AKI CKD Stage II-III: Likely ATN due to shock. Creatinine 1.7.   4. H/o LV Thrombus - now recurrent on echo 04/23/17: Off Xarelto due to previous SAH.  Continue heparin.  5. DMII - Cover with SSI + lantus. 6. Hypothyroidism: TFTs OK 04/22/17. No change to current plan.  Wean IABP. He is fully diuresed.   Amy Clegg NP-C  04/30/2017 9:00 AM  Agreed.   He remains very tenuous. NE off but still on IABP and milrinone.  Co-ox stable at 63%. CVP  down to 4 (with PaPI of 2.75 despite severe RV dysfunction on echo). Maintaining NSR on amiodarone. On heparin. Creatinine back to baseline.   On exam Lying flat in bed NAD JVP flat LIJ swan Cor tachy regular +s3 Lungs clear Ab soft NT ND good BS Ext warm no edema. RFA IABP ok  Will pull IABP tonight. Continue milrinone. Leave swan in overnight and reassess hemodynamics after IABP out. Hopefully he will remain stable now that he is back in NSR.   Long talk with his family friend as wekk who said that he and his friends wouldeb able to support him through  the advanced therapy process particulalrly if he had medicare/medicaid to help with obtaining medical transportation.   CRITICAL CARE Performed by: [name]  Total critical care time: 340 minutes  Critical care time was exclusive of separately billable procedures and treating other patients.  Critical care was necessary to treat or prevent imminent or life-threatening deterioration.  Critical care was time spent personally by me (independent of midlevel providers or residents) on the following activities: development of treatment plan with patient and/or surrogate as well as nursing, discussions with consultants, evaluation of patient's response to treatment, examination of patient, obtaining history from patient or surrogate, ordering and performing treatments and interventions, ordering and review of laboratory studies, ordering and review of radiographic studies, pulse oximetry and re-evaluation of patient's condition.  [name], MD  10:30 PM

## 2017-04-30 NOTE — Progress Notes (Addendum)
ANTICOAGULATION CONSULT NOTE  Pharmacy Consult for Heparin Indication: atrial fibrillation/LV thrombus   Allergies  Allergen Reactions  . Bee Venom    Patient Measurements: Height: 6' (182.9 cm) Weight: 205 lb 0.4 oz (93 kg) IBW/kg (Calculated) : 77.6  Heparin dosing weight: 99 kg   Vital Signs: Temp: 97.2 F (36.2 C) (04/15 0800) Temp Source: Core (04/15 0800) BP: 110/60 (04/15 0800) Pulse Rate: 95 (04/15 0800)  Labs: Recent Labs    04/28/17 0421 04/29/17 0513 04/30/17 0507  HGB 12.9* 13.8 13.4  HCT 38.6* 41.0 39.5  PLT 132* 137* 102*  HEPARINUNFRC 0.41 0.42 0.54  CREATININE 2.02* 1.73* 1.72*   Assessment: 26 yoM with h/o HF admitted with volume overload and in cardiogenic shock. Started on heparin gtt for aFib with RVR. No AC PTA - previously on rivaroxaban but fell, hit his head, and developed [facility] 11/18. Last head CT showed no increase hemorrhage. ECHO 4/8 showed LVEF 20-25% and large LV thrombus. Patient now also has IABP. Heparin level this AM is slightly above goal at 0.54. No issues with infusion per RN. Hgb stable WNL, pltc down 102. No bleeding noted.   Goal of Therapy:  Heparin level 0.3-0.5 units/ml Monitor platelets by anticoagulation protocol: Yes   Plan:  Decrease heparin gtt to 1300 units/hour Recheck heparin level in 6 hours Daily heparin level and CBC Monitor for s/sx of bleeding  Erin N. Zigmund Daniel, PharmD PGY1 Pharmacy Resident Pager: [phone] 04/30/2017 9:00 AM

## 2017-04-30 NOTE — Progress Notes (Signed)
ACT=[id number]  Paged cath lab for IABP removal- they will send someone when they can

## 2017-04-30 NOTE — Progress Notes (Signed)
IABP removed from patient's RFV using manual pressure held for 30 minutes.  Groin site looks unremarkable and patient tolerated well.  Tegaderm and gauze was applied to the site and post care instructions were given to patient.  Vital signs: BP 103/70, HR 104, SATS 97%.

## 2017-05-01 LAB — BASIC METABOLIC PANEL
Anion gap: 9 (ref 5–15)
BUN: 25 mg/dL — ABNORMAL HIGH (ref 6–20)
CO2: 27 mmol/L (ref 22–32)
Calcium: 8.4 mg/dL — ABNORMAL LOW (ref 8.9–10.3)
Chloride: 94 mmol/L — ABNORMAL LOW (ref 101–111)
Creatinine, Ser: 1.73 mg/dL — ABNORMAL HIGH (ref 0.61–1.24)
GFR calc Af Amer: 52 mL/min — ABNORMAL LOW (ref >60)
GFR calc non Af Amer: 45 mL/min — ABNORMAL LOW (ref >60)
Glucose, Bld: 185 mg/dL — ABNORMAL HIGH (ref 65–99)
Potassium: 3.7 mmol/L (ref 3.5–5.1)
Sodium: 130 mmol/L — ABNORMAL LOW (ref 135–145)

## 2017-05-01 LAB — CBC
HCT: 39.5 % (ref 39.0–52.0)
Hemoglobin: 13.3 g/dL (ref 13.0–17.0)
MCH: 28.5 pg (ref 26.0–34.0)
MCHC: 33.7 g/dL (ref 30.0–36.0)
MCV: 84.6 fL (ref 78.0–100.0)
Platelets: 116 10*3/uL — ABNORMAL LOW (ref 150–400)
RBC: 4.67 MIL/uL (ref 4.22–5.81)
RDW: 15.3 % (ref 11.5–15.5)
WBC: 7.4 10*3/uL (ref 4.0–10.5)

## 2017-05-01 LAB — COOXEMETRY PANEL
Carboxyhemoglobin: 1.2 % (ref 0.5–1.5)
Methemoglobin: 1.3 % (ref 0.0–1.5)
O2 Saturation: 58.3 %
Total hemoglobin: 13.6 g/dL (ref 12.0–16.0)

## 2017-05-01 LAB — GLUCOSE, CAPILLARY
Glucose-Capillary: 192 mg/dL — ABNORMAL HIGH (ref 65–99)
Glucose-Capillary: 125 mg/dL — ABNORMAL HIGH (ref 65–99)
Glucose-Capillary: 121 mg/dL — ABNORMAL HIGH (ref 65–99)

## 2017-05-01 LAB — MAGNESIUM: Magnesium: 1.9 mg/dL (ref 1.7–2.4)

## 2017-05-01 LAB — HEPARIN LEVEL (UNFRACTIONATED): Heparin Unfractionated: 0.31 IU/mL (ref 0.30–0.70)

## 2017-05-01 LAB — PROTIME-INR
INR: 1.14
Prothrombin Time: 14.6 seconds (ref 11.4–15.2)

## 2017-05-01 MED ORDER — POTASSIUM CHLORIDE CRYS ER 20 MEQ PO TBCR
40.0000 meq | EXTENDED_RELEASE_TABLET | ORAL | Status: AC
Start: 2017-05-01 — End: 2017-05-01
  Administered 2017-05-01: 40 meq via ORAL
  Filled 2017-05-01: qty 2

## 2017-05-01 MED ORDER — COUMADIN BOOK
Freq: Once | Status: AC
  Administered 2017-05-01: 11:00:00
  Filled 2017-05-01: qty 1

## 2017-05-01 MED ORDER — INSULIN GLARGINE 100 UNIT/ML ~~LOC~~ SOLN
10.0000 [IU] | Freq: Every day | SUBCUTANEOUS | Status: DC
  Administered 2017-05-01 – 2017-05-12 (×12): 10 [IU] via SUBCUTANEOUS
  Administered 2017-05-13: 5 [IU] via SUBCUTANEOUS
  Administered 2017-05-14: 10 [IU] via SUBCUTANEOUS
  Filled 2017-05-01 (×15): qty 0.1

## 2017-05-01 MED ORDER — MAGNESIUM SULFATE 2 GM/50ML IV SOLN
2.0000 g | Freq: Once | INTRAVENOUS | Status: AC
  Administered 2017-05-01: 2 g via INTRAVENOUS
  Filled 2017-05-01: qty 50

## 2017-05-01 MED ORDER — WARFARIN - PHARMACIST DOSING INPATIENT
Freq: Every day | Status: DC
  Administered 2017-05-03: 18:00:00

## 2017-05-01 MED ORDER — WARFARIN SODIUM 5 MG PO TABS: 5.0000 mg | ORAL_TABLET | Freq: Once | ORAL | Status: DC

## 2017-05-01 NOTE — Plan of Care (Signed)
  Problem: Education: Goal: Knowledge of General Education information will improve Outcome: Completed/Met  Pt oriented to unit, plan of care, and method of reporting concerns. Verbalizes understanding of need to call for assistance when necessary. Call light and bedside table within reach.  

## 2017-05-01 NOTE — Progress Notes (Addendum)
Patient ID: Benjamin Sherman, male   DOB: 04-12-1970, 47 y.o.   MRN: [id number]    Advanced Heart Failure Rounding Note   Subjective:    Admitted 04/21/17 with cardiogenic shock and atrial flutter.   Central line placed. Initial co-ox 39%.  IABP placed 04/25/17 with persistent cardiogenic shock.   DCCV atrial flutter 4/12.  Maintaining NSR   IABP removed 4/15. Remains on milrinone 0.375 mcg and amio drip.   Feels much better. Denies SOB, orthopnea or PND. No bleeding on heparin gtt. .   Echo 4/8: LVEF 20-25% with LV apical clot. RV mild to moderately down.   Creatinine 2.02 => 1.73=>1.7 .   Swan numbers (personally reviewed) RA 4 PA 30/19 PCWP CO 4.7  CI 2.1  Co-ox 62%.   Objective:   Weight Range:  Vital Signs:   Temp:  [97.2 F (36.2 C)-99.1 F (37.3 C)] 98.6 F (37 C) (04/16 0800) Pulse Rate:  [91-115] 111 (04/16 0800) Resp:  [11-25] 22 (04/16 0800) BP: (96-116)/(50-97) 96/63 (04/16 0800) SpO2:  [92 %-100 %] 100 % (04/16 0800) Arterial Line BP: (98-123)/(60-72) 98/64 (04/15 1700) Weight:  [205 lb 11 oz (93.3 kg)] 205 lb 11 oz (93.3 kg) (04/16 0533) Last BM Date: 04/21/17  Weight change: Filed Weights   04/29/17 0600 04/30/17 0500 05/01/17 0533  Weight: 208 lb 1.8 oz (94.4 kg) 205 lb 0.4 oz (93 kg) 205 lb 11 oz (93.3 kg)   Intake/Output:   Intake/Output Summary (Last 24 hours) at 05/01/2017 0826 Last data filed at 05/01/2017 0800 Gross per 24 hour  Intake 1779.47 ml  Output 1650 ml  Net 129.47 ml    Physical Exam  CVP 3.  General:  Sitting on the side of the bed. NAD HEENT: normal Anicteric Neck: supple. no JVD. Carotids 2+ bilat; no bruits. No lymphadenopathy or thryomegaly appreciated. RIJ LIJ swan  Cor: PMI laterally displaced. Tachy rate & rhythm. No rubs, gallops or murmurs. Lungs: clear no wheeze Abdomen: soft, nontender, nondistended. No hepatosplenomegaly. No bruits or masses. Good bowel sounds. Extremities: no cyanosis, clubbing, rash, edema RFA  site ok without bruit Neuro: alert & oriented x 3, cranial nerves grossly intact. moves all 4 extremities w/o difficulty. Affect pleasant    Telemetry    NSR 70-80s personally reviewed.   EKG   No new tracings.    Labs    Basic Metabolic Panel: Recent Labs  Lab 04/26/17 1400 04/27/17 0404 04/28/17 0421 04/29/17 0513 04/30/17 0507 05/01/17 0516  NA 128* 128* 127* 130* 131*  --   K 3.6 3.8 4.2 4.0 3.8  --   CL 91* 91* 89* 87* 91*  --   CO2 24 25 27 31 31   --   GLUCOSE 179* 158* 170* 160* 110*  --   BUN 46* 37* 36* 27* 24*  --   CREATININE 2.19* 2.02* 2.02* 1.73* 1.72*  --   CALCIUM 8.9 9.0 8.9 9.6 8.8*  --   MG 1.9 2.2 1.9 2.1 2.3 1.9   Liver Function Tests: No results for input(s): AST, ALT, ALKPHOS, BILITOT, PROT, ALBUMIN in the last 168 hours. No results for input(s): LIPASE, AMYLASE in the last 168 hours. No results for input(s): AMMONIA in the last 168 hours.  CBC: Recent Labs  Lab 04/27/17 0404 04/28/17 0421 04/29/17 0513 04/30/17 0507 05/01/17 0516  WBC 9.8 9.0 8.2 6.5 7.4  HGB 13.3 12.9* 13.8 13.4 13.3  HCT 39.8 38.6* 41.0 39.5 39.5  MCV 85.0 84.3 85.4 85.5 84.6  PLT 157 132* 137* 102* 116*   Cardiac Enzymes: No results for input(s): CKTOTAL, CKMB, CKMBINDEX, TROPONINI in the last 168 hours. BNP: BNP (last 3 results) Recent Labs    11/06/16 1621 03/25/17 1401 04/21/17 1212  BNP 961.6* 835.4* 483.0*   ProBNP (last 3 results) No results for input(s): PROBNP in the last 8760 hours.  Other results:  Imaging: No results found.  Medications:    Scheduled Medications: . Chlorhexidine Gluconate Cloth  6 each Topical Daily  . digoxin  0.125 mg Oral Daily  . insulin aspart  0-15 Units Subcutaneous TID WC  . insulin aspart  0-5 Units Subcutaneous QHS  . insulin glargine  8 Units Subcutaneous QHS  . sodium chloride flush  10-40 mL Intracatheter Q12H  . sodium chloride flush  3 mL Intravenous Q12H  . spironolactone  12.5 mg Oral Daily  .  torsemide  40 mg Oral Daily    Infusions: . sodium chloride 10 mL/hr at 05/01/17 0800  . sodium chloride 1 mL/hr at 05/01/17 0526  . amiodarone 30 mg/hr (05/01/17 0800)  . heparin 1,300 Units/hr (05/01/17 0800)  . milrinone 0.375 mcg/kg/min (05/01/17 0800)  . norepinephrine (LEVOPHED) Adult infusion Stopped (04/29/17 1305)    PRN Medications:    Assessment:   Benjamin Sherman is a 47 y.o. 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 eosinophilic cardiomyopathy, LV thrombus, hypothyroidism, DM2, CKD Stage II-III and traumatic [facility] 11/18 after fall.   Admitted with cardiogenic shock.  Plan/Discussion:    1. Acute on chronic systolic HF -> cardiogenic shock: ECHO 11/2016 EF 25-30% CMRI EF 22%. Bedside echo in HF clinic EF 20% in 3/19.  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 Did not respond to steroids. Admitted 4/6 with cardiogenic shock and volume overload co-ox 38%. Echo 04/23/17: LVEF 20-25% with large LV clot, RV mild to moderately down. - IABP out. Cut back milrinone 0.25 mcg today. CO-OX 58%. BMET pending.   - Continue torsemide 40 mg daily tomorrow.  - Continue  digoxin 0.125.  - Continue spiro 12.5 mg daily. - No ACE/ARB/b-blocker with shock - Poor LVAD candidate with RV failure and renal failure.  Poor transplant candidate with lack of social support.  2. Atrial flutter: New onset. S/p DCCV to NSR on 4/12.  - Maintaining NSR  - Continue amiodarone and heparin gtts for now. Start coumadin today. Like stop amio tomorrow and start po.  - Previously off Xarelto due to [facility].  3. AKI CKD Stage II-III: Likely ATN due to shock. BMET today .   4. H/o LV Thrombus - now recurrent on echo 04/23/17: Off Xarelto due to previous SAH.  Continue heparin. Start coumadin. Pharmacy to educate on coumadin.   5. DMII - Cover with SSI + lantus. 6. Hypothyroidism: TFTs OK 04/22/17. No change to current plan.   Remove swan and transfer to SDU. Consult cardiac  rehab.   Amy Clegg NP-C  05/01/2017 8:26 AM   Patient seen and examined with Tonye Becket, NP. We discussed all aspects of the encounter. I agree with the assessment and plan as stated above.   He is improving steadily. IABP out. Co-ox stable on milrinone 0.375. Maintaining NSR. Agree with pulling swan. Cut milrinone down to 0.25 and follow co-ox closely. Hopefully cardiac output will be stable now that he is back in NSR. Although there is significant RV dysfunction on echo, lat swan numbers provided some optimism that he may be able to tolerate a VAD  if we are unable to wean inotoropes. Switch heparin to Eliquis. Continue IV amio. Discussed dosing with PharmD personally.  [name], MD  10:43 PM

## 2017-05-01 NOTE — Progress Notes (Signed)
ANTICOAGULATION CONSULT NOTE  Pharmacy Consult for Heparin Indication: atrial fibrillation/LV thrombus   Allergies  Allergen Reactions  . Bee Venom    Patient Measurements: Height: 6' (182.9 cm) Weight: 205 lb 11 oz (93.3 kg) IBW/kg (Calculated) : 77.6  Heparin dosing weight: 99 kg   Vital Signs: Temp: 98.2 F (36.8 C) (04/16 0900) Temp Source: Core (04/16 0800) BP: 96/63 (04/16 0800) Pulse Rate: 103 (04/16 0900)  Labs: Recent Labs    04/29/17 0513 04/30/17 0507 05/01/17 0516 05/01/17 0800 05/01/17 0829  HGB 13.8 13.4 13.3  --   --   HCT 41.0 39.5 39.5  --   --   PLT 137* 102* 116*  --   --   HEPARINUNFRC 0.42 0.54  --  0.31  --   CREATININE 1.73* 1.72*  --   --  1.73*   Assessment: 31 yoM with h/o HF admitted with volume overload and in cardiogenic shock. Started on heparin gtt for afib with RVR. No AC PTA - previously on rivaroxaban but fell, hit his head, and developed [facility] 11/18. Last head CT showed no increase hemorrhage. ECHO 4/8 showed LVEF 20-25% and large LV thrombus. Now s/p IABP removal, initial heparin level after resuming just within goal at 0.31. Hgb WNL, pltc 116 stable. No bleeding noted.  Pharmacy now also consulted to start warfarin today. Baseline INR 1.14. Note interaction with amiodarone - can enhance the anticoagulant effect of warfarin. Patient lives in [place] - will need to establish plan for INR follow up.  Goal of Therapy:  Heparin level 0.3-0.7 units/ml Monitor platelets by anticoagulation protocol: Yes   Plan:  Increase heparin drip to 1400 units/hour Give warfarin 5mg  PO x1 Daily heparin level, INR, and CBC Monitor for s/sx of bleeding  Inge Waldroup N. Zigmund Daniel, PharmD PGY1 Pharmacy Resident Pager: [phone] 05/01/2017 10:01 AM

## 2017-05-01 NOTE — Plan of Care (Signed)
Pt extubated on Ra. Cardiac status maintained with heparin and milrinone gtt.

## 2017-05-01 NOTE — Plan of Care (Signed)
  Problem: Education: Goal: Knowledge of General Education information will improve Outcome: Progressing   

## 2017-05-02 LAB — BASIC METABOLIC PANEL
Anion gap: 10 (ref 5–15)
BUN: 27 mg/dL — ABNORMAL HIGH (ref 6–20)
CO2: 26 mmol/L (ref 22–32)
Calcium: 8.7 mg/dL — ABNORMAL LOW (ref 8.9–10.3)
Chloride: 94 mmol/L — ABNORMAL LOW (ref 101–111)
Creatinine, Ser: 1.85 mg/dL — ABNORMAL HIGH (ref 0.61–1.24)
GFR calc Af Amer: 48 mL/min — ABNORMAL LOW (ref >60)
GFR calc non Af Amer: 41 mL/min — ABNORMAL LOW (ref >60)
Glucose, Bld: 130 mg/dL — ABNORMAL HIGH (ref 65–99)
Potassium: 4.1 mmol/L (ref 3.5–5.1)
Sodium: 130 mmol/L — ABNORMAL LOW (ref 135–145)

## 2017-05-02 LAB — COOXEMETRY PANEL
Carboxyhemoglobin: 1.3 % (ref 0.5–1.5)
Carboxyhemoglobin: 1 % (ref 0.5–1.5)
Methemoglobin: 0.8 % (ref 0.0–1.5)
Methemoglobin: 1.3 % (ref 0.0–1.5)
O2 Saturation: 48.8 %
O2 Saturation: 49.2 %
Total hemoglobin: 16.1 g/dL — ABNORMAL HIGH (ref 12.0–16.0)
Total hemoglobin: 14.6 g/dL (ref 12.0–16.0)

## 2017-05-02 LAB — HEPARIN LEVEL (UNFRACTIONATED): Heparin Unfractionated: 0.41 IU/mL (ref 0.30–0.70)

## 2017-05-02 LAB — MAGNESIUM: Magnesium: 2.2 mg/dL (ref 1.7–2.4)

## 2017-05-02 LAB — CBC
HCT: 41.3 % (ref 39.0–52.0)
Hemoglobin: 13.7 g/dL (ref 13.0–17.0)
MCH: 28.5 pg (ref 26.0–34.0)
MCHC: 33.2 g/dL (ref 30.0–36.0)
MCV: 85.9 fL (ref 78.0–100.0)
Platelets: 127 10*3/uL — ABNORMAL LOW (ref 150–400)
RBC: 4.81 MIL/uL (ref 4.22–5.81)
RDW: 15.7 % — ABNORMAL HIGH (ref 11.5–15.5)
WBC: 6.9 10*3/uL (ref 4.0–10.5)

## 2017-05-02 LAB — GLUCOSE, CAPILLARY
Glucose-Capillary: 132 mg/dL — ABNORMAL HIGH (ref 65–99)
Glucose-Capillary: 102 mg/dL — ABNORMAL HIGH (ref 65–99)
Glucose-Capillary: 168 mg/dL — ABNORMAL HIGH (ref 65–99)
Glucose-Capillary: 113 mg/dL — ABNORMAL HIGH (ref 65–99)
Glucose-Capillary: 138 mg/dL — ABNORMAL HIGH (ref 65–99)

## 2017-05-02 LAB — PROTIME-INR
INR: 1.11
Prothrombin Time: 14.2 seconds (ref 11.4–15.2)

## 2017-05-02 MED ORDER — WARFARIN SODIUM 5 MG PO TABS
5.0000 mg | ORAL_TABLET | Freq: Once | ORAL | Status: AC
  Administered 2017-05-02: 5 mg via ORAL
  Filled 2017-05-02: qty 1

## 2017-05-02 NOTE — Progress Notes (Signed)
CARDIAC REHAB PHASE I   PRE:  Rate/Rhythm: 97 SR    BP: sitting 104/70    SaO2: 98 RA  MODE:  Ambulation: 400 ft   POST:  Rate/Rhythm: 98 SR    BP: sitting 105/78     SaO2: 94 RA  Tolerated well, no c/o. Pt frustrated with himself for eating too much salt and not caring for himself. Reviewed ed. Could voice correct responses. Encouraged pt to keep walking independently. [id number]   Harriet Masson CES, ACSM 05/02/2017 3:20 PM

## 2017-05-02 NOTE — Plan of Care (Signed)
  Problem: Activity: Goal: Risk for activity intolerance will decrease Outcome: Completed/Met  Pt ambulates independently with steady gait

## 2017-05-02 NOTE — Discharge Instructions (Addendum)
MOUTH CARE AFTER SURGERY ° °FACTS: °· Ice used in ice bag helps keep the swelling down, and can help lessen the pain. °· It is easier to treat pain BEFORE it happens. °· Spitting disturbs the clot and may cause bleeding to start again, or to get worse. °· Smoking delays healing and can cause complications. °· Sharing prescriptions can be dangerous.  Do not take medications not recently prescribed for you. °· Antibiotics may stop birth control pills from working.  Use other means of birth control while on antibiotics. °· Warm salt water rinses after the first 24 hours will help lessen the swelling:  Use 1/2 teaspoonful of table salt per oz.of water. ° °DO NOT: °· Do not spit.  Do not drink through a straw. °· Strongly advised not to smoke, dip snuff or chew tobacco at least for 3 days. °· Do not eat sharp or crunchy foods.  Avoid the area of surgery when chewing. °· Do not stop your antibiotics before your instructions say to do so. °· Do not eat hot foods until bleeding has stopped.  If you need to, let your food cool down to room temperature. ° °EXPECT: °· Some swelling, especially first 2-3 days. °· Soreness or discomfort in varying degrees.  Follow your dentist's instructions about how to handle pain before it starts. °· Pinkish saliva or light blood in saliva, or on your pillow in the morning.  This can last around 24 hours. °· Bruising inside or outside the mouth.  This may not show up until 2-3 days after surgery.  Don't worry, it will go away in time. °· Pieces of "bone" may work themselves loose.  It's OK.  If they bother you, let us know. ° °WHAT TO DO IMMEDIATELY AFTER SURGERY: °· Bite on the gauze with steady pressure for 1-2 hours.  Don't chew on the gauze. °· Do not lie down flat.  Raise your head support especially for the first 24 hours. °· Apply ice to your face on the side of the surgery.  You may apply it 20 minutes on and a few minutes off.  Ice for 8-12 hours.  You may use ice up to 24  hours. °· Before the numbness wears off, take a pain pill as instructed. °· Prescription pain medication is not always required. ° °SWELLING: °· Expect swelling for the first couple of days.  It should get better after that. °· If swelling increases 3 days or so after surgery; let us know as soon as possible. ° °FEVER: °· Take Tylenol every 4 hours if needed to lower your temperature, especially if it is at 100F or higher. °· Drink lots of fluids. °· If the fever does not go away, let us know. ° °BREATHING TROUBLE: °· Any unusual difficulty breathing means you have to have someone bring you to the emergency room ASAP ° °BLEEDING: °· Light oozing is expected for 24 hours or so. °· Prop head up with pillows °· Avoid spitting °· Do not confuse bright red fresh flowing blood with lots of saliva colored with a little bit of blood. °· If you notice some bleeding, place gauze or a tea bag where it is bleeding and apply CONSTANT pressure by biting down for 1 hour.  Avoid talking during this time.  Do not remove the gauze or tea bag during this hour to "check" the bleeding. °· If you notice bright RED bleeding FLOWING out of particular area, and filling the floor of your mouth, put   a wad of gauze on that area, bite down firmly and constantly.  Call us immediately.  If we're closed, have someone bring you to the emergency room.  ORAL HYGIENE:  Brush your teeth as usual after meals and before bedtime.  Use a soft toothbrush around the area of surgery.  DO NOT AVOID BRUSHING.  Otherwise bacteria(germs) will grow and may delay healing or encourage infection.  Since you cannot spit, just gently rinse and let the water flow out of your mouth.  DO NOT SWISH HARD.  EATING:  Cool liquids are a good point to start.  Increase to soft foods as tolerated.  PRESCRIPTIONS:  Follow the directions for your prescriptions exactly as written.  If Dr. Kristin Bruins gave you a narcotic pain medication, do not drive, operate  machinery or drink alcohol when on that medication.  QUESTIONS:  Call our office during office hours [phone] or call the Emergency Room at [phone].     Information on my medicine - Coumadin   (Warfarin)  This medication education was reviewed with me or my healthcare representative as part of my discharge preparation.  The pharmacist that spoke with me during my hospital stay was:  Clarnce Flock, [facility]  Why was Coumadin prescribed for you? Coumadin was prescribed for you because you have a blood clot or a medical condition that can cause an increased risk of forming blood clots. Blood clots can cause serious health problems by blocking the flow of blood to the heart, lung, or brain. Coumadin can prevent harmful blood clots from forming. As a reminder your indication for Coumadin is:   Atrial fibrillation/LV thrombus  What test will check on my response to Coumadin? While on Coumadin (warfarin) you will need to have an INR test regularly to ensure that your dose is keeping you in the desired range. The INR (international normalized ratio) number is calculated from the result of the laboratory test called prothrombin time (PT).  If an INR APPOINTMENT HAS NOT ALREADY BEEN MADE FOR YOU please schedule an appointment to have this lab work done by your health care provider within 7 days. Your INR goal is usually a number between:  2 to 3 or your provider may give you a more narrow range like 2-2.5.  Ask your health care provider during an office visit what your goal INR is.  What  do you need to  know  About  COUMADIN? Take Coumadin (warfarin) exactly as prescribed by your healthcare provider about the same time each day.  DO NOT stop taking without talking to the doctor who prescribed the medication.  Stopping without other blood clot prevention medication to take the place of Coumadin may increase your risk of developing a new clot or stroke.  Get refills before you run out.  What  do you do if you miss a dose? If you miss a dose, take it as soon as you remember on the same day then continue your regularly scheduled regimen the next day.  Do not take two doses of Coumadin at the same time.  Important Safety Information A possible side effect of Coumadin (Warfarin) is an increased risk of bleeding. You should call your healthcare provider right away if you experience any of the following: ? Bleeding from an injury or your nose that does not stop. ? Unusual colored urine (red or dark brown) or unusual colored stools (red or black). ? Unusual bruising for unknown reasons. ? A serious fall or if you hit  your head (even if there is no bleeding).  Some foods or medicines interact with Coumadin (warfarin) and might alter your response to warfarin. To help avoid this: ? Eat a balanced diet, maintaining a consistent amount of Vitamin K. ? Notify your provider about major diet changes you plan to make. ? Avoid alcohol or limit your intake to 1 drink for women and 2 drinks for men per day. (1 drink is 5 oz. wine, 12 oz. beer, or 1.5 oz. liquor.)  Make sure that ANY health care provider who prescribes medication for you knows that you are taking Coumadin (warfarin).  Also make sure the healthcare provider who is monitoring your Coumadin knows when you have started a new medication including herbals and non-prescription products.  Coumadin (Warfarin)  Major Drug Interactions  Increased Warfarin Effect Decreased Warfarin Effect  Alcohol (large quantities) Antibiotics (esp. Septra/Bactrim, Flagyl, Cipro) Amiodarone (Cordarone) Aspirin (ASA) Cimetidine (Tagamet) Megestrol (Megace) NSAIDs (ibuprofen, naproxen, etc.) Piroxicam (Feldene) Propafenone (Rythmol SR) Propranolol (Inderal) Isoniazid (INH) Posaconazole (Noxafil) Barbiturates (Phenobarbital) Carbamazepine (Tegretol) Chlordiazepoxide (Librium) Cholestyramine (Questran) Griseofulvin Oral  Contraceptives Rifampin Sucralfate (Carafate) Vitamin K   Coumadin (Warfarin) Major Herbal Interactions  Increased Warfarin Effect Decreased Warfarin Effect  Garlic Ginseng Ginkgo biloba Coenzyme Q10 Green tea St. Johns wort    Coumadin (Warfarin) FOOD Interactions  Eat a consistent number of servings per week of foods HIGH in Vitamin K (1 serving =  cup)  Collards (cooked, or boiled & drained) Kale (cooked, or boiled & drained) Mustard greens (cooked, or boiled & drained) Parsley *serving size only =  cup Spinach (cooked, or boiled & drained) Swiss chard (cooked, or boiled & drained) Turnip greens (cooked, or boiled & drained)  Eat a consistent number of servings per week of foods MEDIUM-HIGH in Vitamin K (1 serving = 1 cup)  Asparagus (cooked, or boiled & drained) Broccoli (cooked, boiled & drained, or raw & chopped) Brussel sprouts (cooked, or boiled & drained) *serving size only =  cup Lettuce, raw (green leaf, endive, romaine) Spinach, raw Turnip greens, raw & chopped   These websites have more information on Coumadin (warfarin):  http://www.king-russell.com/; https://www.hines.net/;

## 2017-05-02 NOTE — Progress Notes (Addendum)
Patient ID: Benjamin Sherman, male   DOB: [birth year], 47 y.o.   MRN: [id number]    Advanced Heart Failure Rounding Note   Subjective:    Admitted 04/21/17 with cardiogenic shock and atrial flutter.   Central line placed. Initial co-ox 39%.  IABP placed 04/25/17 with persistent cardiogenic shock.   DCCV atrial flutter 4/12.  Maintaining NSR   IABP removed 4/15. Remains on milrinone 0.375 mcg and amio drip.   Coox 48.8% on milrinone 0.25 mcg/kg/min. (Weaned from 0.375 yesterday).   Despite drop in coox, he is feeling good today.  He denies SOB. No lightheadedness or dizziness. Able to walk 400 feet with CR. Eager to go home.   Echo 4/8: LVEF 20-25% with LV apical clot. RV mild to moderately down.   Creatinine 2.02 => 1.73=> 1.7 => 1.85.  Objective:   Weight Range:  Vital Signs:   Temp:  [97.8 F (36.6 C)-98.7 F (37.1 C)] 97.8 F (36.6 C) (04/17 0748) Pulse Rate:  [96-105] 96 (04/17 0748) Resp:  [15-25] 18 (04/17 0748) BP: (94-114)/(61-92) 104/78 (04/17 0748) SpO2:  [95 %-100 %] 100 % (04/17 0748) Weight:  [206 lb 2.1 oz (93.5 kg)] 206 lb 2.1 oz (93.5 kg) (04/17 0340) Last BM Date: 04/29/17  Weight change: Filed Weights   04/30/17 0500 05/01/17 0533 05/02/17 0340  Weight: 205 lb 0.4 oz (93 kg) 205 lb 11 oz (93.3 kg) 206 lb 2.1 oz (93.5 kg)   Intake/Output:   Intake/Output Summary (Last 24 hours) at 05/02/2017 0807 Last data filed at 05/02/2017 0647 Gross per 24 hour  Intake 1394.95 ml  Output 2600 ml  Net -1205.05 ml    Physical Exam   CVP ~6-7 cm  General: Sitting on the side of the bed, eating breakfast. NAD HEENT: Normal Neck: Supple. JVP 6-7 cm. Carotids 2+ bilat; no bruits. No thyromegaly or nodule noted. Cor: PMI laterally displaced. Regular, slightly tachy. +s3 Lungs: CTAB, normal effort. No wheeze Abdomen: Soft, non-tender, non-distended, no HSM. No bruits or masses. +BS  Extremities: No cyanosis, clubbing, or rash. R and LLE no edema.  Neuro: alert &  oriented x 3, cranial nerves grossly intact. moves all 4 extremities w/o difficulty. Affect pleasant    Telemetry    NSR 90s, personally reviewed.   EKG   No new tracings.    Labs    Basic Metabolic Panel: Recent Labs  Lab 04/28/17 0421 04/29/17 0513 04/30/17 0507 05/01/17 0516 05/01/17 0829 05/02/17 0428  NA 127* 130* 131*  --  130* 130*  K 4.2 4.0 3.8  --  3.7 4.1  CL 89* 87* 91*  --  94* 94*  CO2 27 31 31   --  27 26  GLUCOSE 170* 160* 110*  --  185* 130*  BUN 36* 27* 24*  --  25* 27*  CREATININE 2.02* 1.73* 1.72*  --  1.73* 1.85*  CALCIUM 8.9 9.6 8.8*  --  8.4* 8.7*  MG 1.9 2.1 2.3 1.9  --  2.2   Liver Function Tests: No results for input(s): AST, ALT, ALKPHOS, BILITOT, PROT, ALBUMIN in the last 168 hours. No results for input(s): LIPASE, AMYLASE in the last 168 hours. No results for input(s): AMMONIA in the last 168 hours.  CBC: Recent Labs  Lab 04/28/17 0421 04/29/17 0513 04/30/17 0507 05/01/17 0516 05/02/17 0428  WBC 9.0 8.2 6.5 7.4 6.9  HGB 12.9* 13.8 13.4 13.3 13.7  HCT 38.6* 41.0 39.5 39.5 41.3  MCV 84.3 85.4 85.5 84.6 85.9  PLT  132* 137* 102* 116* 127*   Cardiac Enzymes: No results for input(s): CKTOTAL, CKMB, CKMBINDEX, TROPONINI in the last 168 hours. BNP: BNP (last 3 results) Recent Labs    11/06/16 1621 03/25/17 1401 04/21/17 1212  BNP 961.6* 835.4* 483.0*   ProBNP (last 3 results) No results for input(s): PROBNP in the last 8760 hours.  Other results:  Imaging: No results found.  Medications:    Scheduled Medications: . Chlorhexidine Gluconate Cloth  6 each Topical Daily  . digoxin  0.125 mg Oral Daily  . insulin aspart  0-15 Units Subcutaneous TID WC  . insulin aspart  0-5 Units Subcutaneous QHS  . insulin glargine  10 Units Subcutaneous QHS  . sodium chloride flush  10-40 mL Intracatheter Q12H  . sodium chloride flush  3 mL Intravenous Q12H  . spironolactone  12.5 mg Oral Daily  . torsemide  40 mg Oral Daily  .  warfarin  5 mg Oral ONCE-1800  . Warfarin - Pharmacist Dosing Inpatient   Does not apply q1800    Infusions: . sodium chloride Stopped (05/01/17 1001)  . sodium chloride Stopped (05/01/17 0800)  . amiodarone 30 mg/hr (05/02/17 0753)  . heparin 1,400 Units/hr (05/02/17 0647)  . milrinone 0.25 mcg/kg/min (05/02/17 9604)    PRN Medications:    Assessment:   Benjamin Sherman is a 47 y.o. 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 eosinophilic cardiomyopathy, LV thrombus, hypothyroidism, DM2, CKD Stage II-III and traumatic Benjamin Sherman 11/18 after fall.   Admitted with cardiogenic shock.  Plan/Discussion:    1. Acute on chronic systolic HF -> cardiogenic shock: ECHO 11/2016 EF 25-30% CMRI EF 22%. Bedside echo in HF clinic EF 20% in 3/19.  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 Did not respond to steroids. Admitted 4/6 with cardiogenic shock and volume overload co-ox 38%. Echo 04/23/17: LVEF 20-25% with large LV clot, RV mild to moderately down. - IABP out. Swan pulled 05/01/17. - Coox 48% this am on milrinone 0.25 mcg/kg/min.  Repeat. If remains low may need to go back up and discuss for potential VAD.  - Continue torsemide 40 mg daily  - Continue  digoxin 0.125.  - Continue spiro 12.5 mg daily. - No ACE/ARB/b-blocker with shock - Poor LVAD candidate with RV failure and renal failure.  Poor transplant candidate with lack of social support.  2. Atrial flutter: New onset. S/p DCCV to NSR on 4/12.  - Maintaining NSR s/p DCCV.  - Continue amiodarone and heparin gtts for now. If able to maintain on lower dose of milrinone, will transition to po amiodarone.  - Coumadin started 05/01/17. Dosing per pharm D.  - Previously off Xarelto due to [facility].  3. AKI CKD Stage II-III: Likely ATN due to shock. BMET today .   4. H/o LV Thrombus - now recurrent on echo 04/23/17: Off Xarelto due to previous SAH.  Continue heparin.  - Coumadin started 05/01/17. Dosing per pharmacy.  INR 1.11 today.  5. DMII - Cover with SSI plus lantus.  6. Hypothyroidism: TFTs OK 04/22/17.  - No change to current plan.    Repeat Coox. May need to go back up on milrinone.   Benjamin Freer, PA-C  05/02/2017 8:07 AM   Advanced Heart Failure Team Pager [phone] (M-F; 7a - 4p)  Please contact [facility] for night-coverage after hours (4p -7a ) and weekends on [email or web address]  Patient seen and examined with the above-signed Advanced Practice Provider and/or Housestaff. I personally reviewed  laboratory data, imaging studies and relevant notes. I independently examined the patient and formulated the important aspects of the plan. I have edited the note to reflect any of my changes or salient points. I have personally discussed the plan with the patient and/or family.  He is maintaining NSR but his co-ox is dropping quickly indicative of very poor cardiac reserve. Volume status and renal function currently stable. I am very concerned that he will not have the cardiac reserve to tolerate full milrinone wean. And, I also suspect that he will continue to decline despite milrinone. Will need to discuss with VAD team if we think he has the support to go through VAD. Significant RV failure also a major barrier. He may not have a way out of this.    [name], MD  10:30 PM

## 2017-05-02 NOTE — Progress Notes (Signed)
ANTICOAGULATION CONSULT NOTE  Pharmacy Consult for Heparin Indication: atrial fibrillation/LV thrombus   Allergies  Allergen Reactions  . Bee Venom    Patient Measurements: Height: 6' (182.9 cm) Weight: 206 lb 2.1 oz (93.5 kg) IBW/kg (Calculated) : 77.6  Heparin dosing weight: 99 kg   Vital Signs: Temp: 98.6 F (37 C) (04/17 0340) Temp Source: Oral (04/17 0340) BP: 102/66 (04/17 0340) Pulse Rate: 96 (04/17 0340)  Labs: Recent Labs    04/30/17 0507 05/01/17 0516 05/01/17 0800 05/01/17 0829 05/01/17 0922 05/02/17 0428  HGB 13.4 13.3  --   --   --  13.7  HCT 39.5 39.5  --   --   --  41.3  PLT 102* 116*  --   --   --  127*  LABPROT  --   --   --   --  14.6 14.2  INR  --   --   --   --  1.14 1.11  HEPARINUNFRC 0.54  --  0.31  --   --  0.41  CREATININE 1.72*  --   --  1.73*  --  1.85*   Assessment: 13 yoM with h/o HF admitted with volume overload and in cardiogenic shock. Started on heparin gtt for afib with RVR. No AC PTA - previously on rivaroxaban but fell, hit his head, and developed [facility] 11/18. Last head CT showed no increase hemorrhage. ECHO 4/8 showed LVEF 20-25% and large LV thrombus. Now s/p IABP removal. Hgb WNL, pltc 127 stable. No bleeding noted. Heparin level is therapeutic at 0.41.   Pharmacy now also consulted to start warfarin. Baseline INR 1.14. INR today decreased to 1.11. Warfarin dose ordered for last night was not administered per chart-likely due to timing of transfer from 2H to 6E. Note interaction with amiodarone - can enhance the anticoagulant effect of warfarin. Patient lives in [place] - will need to establish plan for INR follow up.  Goal of Therapy:  Heparin level 0.3-0.7 units/ml Monitor platelets by anticoagulation protocol: Yes   Plan:  Continue heparin drip at 1400 units/hour Give warfarin 5mg  PO x1 Daily heparin level, INR, and CBC Monitor for s/sx of bleeding  Girard Cooter, PharmD Clinical Pharmacist  Pager: [phone] Clinical  Phone for 05/02/2017 until 3:30pm: [phone] If after 3:30pm, please call main pharmacy at [phone] 05/02/2017 7:45 AM

## 2017-05-03 LAB — BASIC METABOLIC PANEL
Anion gap: 10 (ref 5–15)
BUN: 31 mg/dL — ABNORMAL HIGH (ref 6–20)
CO2: 25 mmol/L (ref 22–32)
Calcium: 8.7 mg/dL — ABNORMAL LOW (ref 8.9–10.3)
Chloride: 96 mmol/L — ABNORMAL LOW (ref 101–111)
Creatinine, Ser: 1.93 mg/dL — ABNORMAL HIGH (ref 0.61–1.24)
GFR calc Af Amer: 46 mL/min — ABNORMAL LOW (ref >60)
GFR calc non Af Amer: 39 mL/min — ABNORMAL LOW (ref >60)
Glucose, Bld: 126 mg/dL — ABNORMAL HIGH (ref 65–99)
Potassium: 3.8 mmol/L (ref 3.5–5.1)
Sodium: 131 mmol/L — ABNORMAL LOW (ref 135–145)

## 2017-05-03 LAB — CBC
HCT: 41.2 % (ref 39.0–52.0)
Hemoglobin: 13.7 g/dL (ref 13.0–17.0)
MCH: 28.5 pg (ref 26.0–34.0)
MCHC: 33.3 g/dL (ref 30.0–36.0)
MCV: 85.7 fL (ref 78.0–100.0)
Platelets: 171 10*3/uL (ref 150–400)
RBC: 4.81 MIL/uL (ref 4.22–5.81)
RDW: 15.4 % (ref 11.5–15.5)
WBC: 6.7 10*3/uL (ref 4.0–10.5)

## 2017-05-03 LAB — GLUCOSE, CAPILLARY
Glucose-Capillary: 123 mg/dL — ABNORMAL HIGH (ref 65–99)
Glucose-Capillary: 150 mg/dL — ABNORMAL HIGH (ref 65–99)
Glucose-Capillary: 128 mg/dL — ABNORMAL HIGH (ref 65–99)
Glucose-Capillary: 106 mg/dL — ABNORMAL HIGH (ref 65–99)

## 2017-05-03 LAB — PROTIME-INR
INR: 1.11
Prothrombin Time: 14.2 seconds (ref 11.4–15.2)

## 2017-05-03 LAB — MAGNESIUM: Magnesium: 2.1 mg/dL (ref 1.7–2.4)

## 2017-05-03 LAB — COOXEMETRY PANEL
Carboxyhemoglobin: 1.2 % (ref 0.5–1.5)
Methemoglobin: 1.3 % (ref 0.0–1.5)
O2 Saturation: 60 %
Total hemoglobin: 13.8 g/dL (ref 12.0–16.0)

## 2017-05-03 LAB — HEPARIN LEVEL (UNFRACTIONATED): Heparin Unfractionated: 0.38 IU/mL (ref 0.30–0.70)

## 2017-05-03 MED ORDER — WARFARIN SODIUM 5 MG PO TABS
5.0000 mg | ORAL_TABLET | Freq: Once | ORAL | Status: AC
  Administered 2017-05-03: 5 mg via ORAL
  Filled 2017-05-03: qty 1

## 2017-05-03 NOTE — Progress Notes (Signed)
Nutrition Brief Note  Chart reviewed. Patient reports good intake PTA and since admission. He reports not following a low sodium diet PTA, but is now. He declines diet education, says he has information in the booklet/folder he was given. He has lost some weight since admission with diuresis. Overall weight trend has been stable.  Nutrition focused physical exam completed.  No muscle or subcutaneous fat depletion noticed.  Wt Readings from Last 15 Encounters:  05/03/17 204 lb 12.8 oz (92.9 kg)  03/27/17 228 lb 3.2 oz (103.5 kg)  12/13/16 197 lb 5 oz (89.5 kg)  11/28/16 207 lb 12.8 oz (94.3 kg)  11/16/16 206 lb 4.8 oz (93.6 kg)    Body mass index is 27.78 kg/m. Patient meets criteria for overweight based on current BMI.   Current diet order is heart healthy CHO modified, patient is consuming approximately 100% of meals at this time. Labs and medications reviewed.   No nutrition interventions warranted at this time. If nutrition issues arise, please consult RD.    Joaquin Courts, RD, LDN, CNSC Pager [phone] After Hours Pager [phone]

## 2017-05-03 NOTE — Progress Notes (Addendum)
Patient ID: Benjamin Sherman, male   DOB: [birth year], 47 y.o.   MRN: [id number]    Advanced Heart Failure Rounding Note   Subjective:    Admitted 04/21/17 with cardiogenic shock and atrial flutter.   Central line placed. Initial co-ox 39%.  IABP placed 04/25/17 with persistent cardiogenic shock.   DCCV atrial flutter 4/12.  Maintaining NSR   IABP removed 4/15.   Coox 60.0% this am on milrinone back up to 0.375 mcg/kg/min.   Feeling good this am. Walked 400 feet with CR and walked the halls multiple times by himself. Denies lightheadedness or dizziness. No SOB walking halls. No orthopnea or PND. Eager to go home.   Echo 4/8: LVEF 20-25% with LV apical clot. RV mild to moderately down.   Creatinine 2.02 => 1.73=> 1.7 => 1.85 => 1.93  Objective:   Weight Range:  Vital Signs:   Temp:  [97.5 F (36.4 C)-98.8 F (37.1 C)] 98.3 F (36.8 C) (04/18 0435) Pulse Rate:  [91-100] 98 (04/18 0435) Resp:  [18] 18 (04/17 0748) BP: (94-105)/(51-87) 94/73 (04/18 0435) SpO2:  [98 %-100 %] 98 % (04/18 0435) Weight:  [204 lb 12.8 oz (92.9 kg)] 204 lb 12.8 oz (92.9 kg) (04/18 0435) Last BM Date: 04/29/17  Weight change: Filed Weights   05/01/17 0533 05/02/17 0340 05/03/17 0435  Weight: 205 lb 11 oz (93.3 kg) 206 lb 2.1 oz (93.5 kg) 204 lb 12.8 oz (92.9 kg)   Intake/Output:   Intake/Output Summary (Last 24 hours) at 05/03/2017 0741 Last data filed at 05/03/2017 0648 Gross per 24 hour  Intake 2181.64 ml  Output 2000 ml  Net 181.64 ml    Physical Exam   CVP ~6  General: Sitting on the side of the bed, eating breakfast. NAD.  HEENT: Norma lancteric Neck: Supple. JVP 6-7 cm. Carotids 2+ bilat; no bruits. No thyromegaly or nodule noted. Cor: PMI laterally displaced. RRR, slightly-tachy. +S3.  Lungs: CTAB, normal effort. o wheeze Abdomen: Soft, non-tender, non-distended, no HSM. No bruits or masses. +BS  Extremities: No cyanosis, clubbing, or rash. R and LLE no edema.  warm Neuro: alert  & oriented x 3, cranial nerves grossly intact. moves all 4 extremities w/o difficulty. Affect pleasant   Telemetry    NSR 90-100s, personally reviewed.   EKG   No new tracings.    Labs    Basic Metabolic Panel: Recent Labs  Lab 04/29/17 0513 04/30/17 0507 05/01/17 0516 05/01/17 0829 05/02/17 0428 05/03/17 0450  NA 130* 131*  --  130* 130* 131*  K 4.0 3.8  --  3.7 4.1 3.8  CL 87* 91*  --  94* 94* 96*  CO2 31 31  --  27 26 25   GLUCOSE 160* 110*  --  185* 130* 126*  BUN 27* 24*  --  25* 27* 31*  CREATININE 1.73* 1.72*  --  1.73* 1.85* 1.93*  CALCIUM 9.6 8.8*  --  8.4* 8.7* 8.7*  MG 2.1 2.3 1.9  --  2.2 2.1   Liver Function Tests: No results for input(s): AST, ALT, ALKPHOS, BILITOT, PROT, ALBUMIN in the last 168 hours. No results for input(s): LIPASE, AMYLASE in the last 168 hours. No results for input(s): AMMONIA in the last 168 hours.  CBC: Recent Labs  Lab 04/28/17 0421 04/29/17 0513 04/30/17 0507 05/01/17 0516 05/02/17 0428  WBC 9.0 8.2 6.5 7.4 6.9  HGB 12.9* 13.8 13.4 13.3 13.7  HCT 38.6* 41.0 39.5 39.5 41.3  MCV 84.3 85.4 85.5 84.6 85.9  PLT 132* 137* 102* 116* 127*   Cardiac Enzymes: No results for input(s): CKTOTAL, CKMB, CKMBINDEX, TROPONINI in the last 168 hours. BNP: BNP (last 3 results) Recent Labs    11/06/16 1621 03/25/17 1401 04/21/17 1212  BNP 961.6* 835.4* 483.0*   ProBNP (last 3 results) No results for input(s): PROBNP in the last 8760 hours.  Other results:  Imaging: No results found.  Medications:    Scheduled Medications: . Chlorhexidine Gluconate Cloth  6 each Topical Daily  . digoxin  0.125 mg Oral Daily  . insulin aspart  0-15 Units Subcutaneous TID WC  . insulin aspart  0-5 Units Subcutaneous QHS  . insulin glargine  10 Units Subcutaneous QHS  . sodium chloride flush  10-40 mL Intracatheter Q12H  . sodium chloride flush  3 mL Intravenous Q12H  . spironolactone  12.5 mg Oral Daily  . torsemide  40 mg Oral Daily  .  Warfarin - Pharmacist Dosing Inpatient   Does not apply q1800    Infusions: . sodium chloride Stopped (05/01/17 1001)  . sodium chloride Stopped (05/01/17 0800)  . amiodarone 30 mg/hr (05/03/17 0648)  . heparin 1,400 Units/hr (05/03/17 9604)  . milrinone 0.375 mcg/kg/min (05/03/17 5409)    PRN Medications:    Assessment:   Benjamin Sherman is a 47 y.o. 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 eosinophilic cardiomyopathy, LV thrombus, hypothyroidism, DM2, CKD Stage II-III and traumatic [facility] 11/18 after fall.   Admitted with cardiogenic shock.  Plan/Discussion:    1. Acute on chronic systolic HF -> cardiogenic shock: ECHO 11/2016 EF 25-30% CMRI EF 22%. Bedside echo in HF clinic EF 20% in 3/19.  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 Did not respond to steroids. Admitted 4/6 with cardiogenic shock and volume overload co-ox 38%. Echo 04/23/17: LVEF 20-25% with large LV clot, RV mild to moderately down. - IABP out. Swan pulled 05/01/17. - Coox 60.0% this am on milrinone 0.375 mcg/kg/min. Drop of coox with attempts to wean concerning for poor cardiac reserve.  - Continue torsemide 40 mg daily  - Continue  digoxin 0.125.  - Continue spiro 12.5 mg daily. - No ACE/ARB/b-blocker with shock - Poor LVAD candidate with RV failure and renal failure.  Poor transplant candidate with lack of social support.  2. Atrial flutter: New onset. S/p DCCV to NSR on 4/12.  - Maintaining NSR s/p DCCV.  - - No change to current plan.   - Continue amiodarone and heparin gtts for now. If able to maintain on lower dose of milrinone, will transition to po amiodarone.  - Coumadin started 05/01/17. Dosing per pharm D.  - Previously off Xarelto due to [facility].  3. AKI CKD Stage II-III: Likely ATN due to shock - Trending up slightly. Continue to follow.  4. H/o LV Thrombus - now recurrent on echo 04/23/17: Off Xarelto due to previous SAH.  Continue heparin.  - Coumadin started  05/01/17. Dosing per pharmacy. INR unchanged at 1.11 today.  5. DMII - Cover with SSI plus lantus.  - No change to current plan.   6. Hypothyroidism: TFTs OK 04/22/17.  - No change to current plan.    Will need to discuss ultimate plan with MD and VAD team. He has multiple barriers to VAD that we may not be able to overcome, but will do our best to optimize. Continue current regimen for now. Watch kidney function closely.   Graciella Freer, PA-C  05/03/2017 7:41 AM  Advanced Heart Failure Team Pager [phone] (M-F; 7a - 4p)  Please contact [facility] for night-coverage after hours (4p -7a ) and weekends on [email or web address]  Patient seen and examined with the above-signed Advanced Practice Provider and/or Housestaff. I personally reviewed laboratory data, imaging studies and relevant notes. I independently examined the patient and formulated the important aspects of the plan. I have edited the note to reflect any of my changes or salient points. I have personally discussed the plan with the patient and/or family.  Very difficult situation. Co-ox now improved on higher dose milrinone but doubt it will last. Volume status ok. I think we have two options at this point. 1) Let him go home with brief trial of milrinone to see if LV will recover much with maintenance of NSR or 2) proceed with VAD despite of at least moderate RV dysfunction. I do not think wither option is ideal. Will d/w with VAD team. Durable VAD is likely only viable long-term option but RV dysfunction and limited social support are major obstacles.   [name], MD  9:41 PM

## 2017-05-03 NOTE — Progress Notes (Signed)
ANTICOAGULATION CONSULT NOTE  Pharmacy Consult for Heparin, Warfarin Indication: atrial fibrillation/LV thrombus   Allergies  Allergen Reactions  . Bee Venom    Patient Measurements: Height: 6' (182.9 cm) Weight: 204 lb 12.8 oz (92.9 kg) IBW/kg (Calculated) : 77.6  Heparin dosing weight: 99 kg   Vital Signs: Temp: 98.4 F (36.9 C) (04/18 1137) Temp Source: Oral (04/18 1137) BP: 100/71 (04/18 1137) Pulse Rate: 101 (04/18 1137)  Labs: Recent Labs    05/01/17 0516 05/01/17 0800 05/01/17 0829 05/01/17 0922 05/02/17 0428 05/03/17 0450 05/03/17 1232  HGB 13.3  --   --   --  13.7  --  13.7  HCT 39.5  --   --   --  41.3  --  41.2  PLT 116*  --   --   --  127*  --  171  LABPROT  --   --   --  14.6 14.2 14.2  --   INR  --   --   --  1.14 1.11 1.11  --   HEPARINUNFRC  --  0.31  --   --  0.41 0.38  --   CREATININE  --   --  1.73*  --  1.85* 1.93*  --    Assessment: 75 yoM with h/o HF admitted with volume overload and in cardiogenic shock. Started on heparin gtt for afib with RVR. No AC PTA - previously on rivaroxaban but fell, hit his head, and developed [facility] 11/18. Last head CT showed no increase hemorrhage. ECHO 4/8 showed LVEF 20-25% and large LV thrombus. Now s/p IABP removal. Hgb WNL, pltc stable. No bleeding noted. Heparin level is therapeutic at 0.38.   Pharmacy now also consulted to start warfarin. Baseline INR 1.14. INR today decreased to 1.11. Warfarin dose ordered for last night was not administered per chart. Note interaction with amiodarone - can enhance the anticoagulant effect of warfarin. Patient lives in [place] - will need to establish plan for INR follow up.  Goal of Therapy:  Heparin level 0.3-0.7 units/ml Monitor platelets by anticoagulation protocol: Yes   Plan:  Continue heparin drip at 1400 units/hour Give warfarin 5mg  PO x1 Daily heparin level, INR, and CBC Monitor for s/sx of bleeding  Tad Moore, BCPS  Clinical Pharmacist Pager  [phone]  05/03/2017 1:09 PM

## 2017-05-04 ENCOUNTER — Inpatient Hospital Stay (HOSPITAL_COMMUNITY): Payer: Medicaid Other

## 2017-05-04 DIAGNOSIS — I340 Nonrheumatic mitral (valve) insufficiency: Secondary | ICD-10-CM

## 2017-05-04 LAB — PROTIME-INR
INR: 1.16
Prothrombin Time: 14.7 seconds (ref 11.4–15.2)

## 2017-05-04 LAB — TYPE AND SCREEN
ABO/RH(D): A POS
Antibody Screen: NEGATIVE

## 2017-05-04 LAB — BASIC METABOLIC PANEL
Anion gap: 11 (ref 5–15)
BUN: 33 mg/dL — ABNORMAL HIGH (ref 6–20)
CO2: 26 mmol/L (ref 22–32)
Calcium: 9 mg/dL (ref 8.9–10.3)
Chloride: 96 mmol/L — ABNORMAL LOW (ref 101–111)
Creatinine, Ser: 1.92 mg/dL — ABNORMAL HIGH (ref 0.61–1.24)
GFR calc Af Amer: 46 mL/min — ABNORMAL LOW (ref >60)
GFR calc non Af Amer: 40 mL/min — ABNORMAL LOW (ref >60)
Glucose, Bld: 162 mg/dL — ABNORMAL HIGH (ref 65–99)
Potassium: 3.8 mmol/L (ref 3.5–5.1)
Sodium: 133 mmol/L — ABNORMAL LOW (ref 135–145)

## 2017-05-04 LAB — GLUCOSE, CAPILLARY
Glucose-Capillary: 124 mg/dL — ABNORMAL HIGH (ref 65–99)
Glucose-Capillary: 138 mg/dL — ABNORMAL HIGH (ref 65–99)
Glucose-Capillary: 103 mg/dL — ABNORMAL HIGH (ref 65–99)
Glucose-Capillary: 131 mg/dL — ABNORMAL HIGH (ref 65–99)

## 2017-05-04 LAB — ECHOCARDIOGRAM COMPLETE
Height: 72 in
Weight: 3280.44 oz

## 2017-05-04 LAB — CBC
HCT: 39.2 % (ref 39.0–52.0)
Hemoglobin: 12.6 g/dL — ABNORMAL LOW (ref 13.0–17.0)
MCH: 27.6 pg (ref 26.0–34.0)
MCHC: 32.1 g/dL (ref 30.0–36.0)
MCV: 85.8 fL (ref 78.0–100.0)
Platelets: 193 10*3/uL (ref 150–400)
RBC: 4.57 MIL/uL (ref 4.22–5.81)
RDW: 15.6 % — ABNORMAL HIGH (ref 11.5–15.5)
WBC: 6.3 10*3/uL (ref 4.0–10.5)

## 2017-05-04 LAB — MAGNESIUM: Magnesium: 2 mg/dL (ref 1.7–2.4)

## 2017-05-04 LAB — COOXEMETRY PANEL
Carboxyhemoglobin: 0.8 % (ref 0.5–1.5)
Carboxyhemoglobin: 1 % (ref 0.5–1.5)
Methemoglobin: 1.3 % (ref 0.0–1.5)
Methemoglobin: 1.3 % (ref 0.0–1.5)
O2 Saturation: 41.1 %
O2 Saturation: 60.5 %
Total hemoglobin: 13.2 g/dL (ref 12.0–16.0)
Total hemoglobin: 13.4 g/dL (ref 12.0–16.0)

## 2017-05-04 LAB — HEPARIN LEVEL (UNFRACTIONATED): Heparin Unfractionated: 0.5 IU/mL (ref 0.30–0.70)

## 2017-05-04 MED ORDER — SODIUM CHLORIDE 0.9 % IV SOLN: Freq: Once | INTRAVENOUS | Status: DC

## 2017-05-04 MED ORDER — WARFARIN SODIUM 7.5 MG PO TABS
7.5000 mg | ORAL_TABLET | Freq: Once | ORAL | Status: AC
  Administered 2017-05-04: 7.5 mg via ORAL
  Filled 2017-05-04: qty 1

## 2017-05-04 NOTE — Progress Notes (Signed)
  Echocardiogram 2D Echocardiogram has been performed.  Benjamin Sherman F 05/04/2017, 8:50 AM

## 2017-05-04 NOTE — Progress Notes (Signed)
[id number] Cardiac Rehab Pt states that he walked 20 minutes ago. He walked 470 feet independently. Pt denies any difficulty walking. I encouraged him to continue to ambulate 2-3 times daily. We discussed low sodium diet. He states that he has been eating to much sodium and feels that he can and wants to do better. We discussed ways that he could improve on following the low sodium diet and how to cut back on his fluid intake. I gave him some tips on how to treat dry mouth. He states that "I want to live."

## 2017-05-04 NOTE — Progress Notes (Signed)
Pt refused a lab draw.   Hinton Dyer, RN

## 2017-05-04 NOTE — Progress Notes (Addendum)
Patient ID: Benjamin Sherman, male   DOB: 08-Sep-1970, 47 y.o.   MRN: [id number]    Advanced Heart Failure Rounding Note   Subjective:    Admitted 04/21/17 with cardiogenic shock and atrial flutter.   Central line placed. Initial co-ox 39%.  IABP placed 04/25/17 with persistent cardiogenic shock.   DCCV atrial flutter 4/12.  Maintaining NSR   IABP removed 4/15.   Coox back down to 41.1% this am despite milrinone 0.375 mcg/kg/min. Repeat pending.   He feels Ok this am, but is discouraged by lack of progress. Denies SOB at this time. No lightheadedness or dizziness walking the halls yesterday. Able to ambulate room. CVP low   Echo 4/8: LVEF 20-25% with LV apical clot. RV mild to moderately down.   Creatinine 2.02 => 1.73=> 1.7 => 1.85 => 1.93=> 1.92  Objective:   Weight Range:  Vital Signs:   Temp:  [97.8 F (36.6 C)-98.8 F (37.1 C)] 98.3 F (36.8 C) (04/19 0448) Pulse Rate:  [91-101] 91 (04/19 0448) Resp:  [18] 18 (04/19 0448) BP: (86-104)/(69-79) 101/69 (04/19 0448) SpO2:  [98 %-100 %] 99 % (04/19 0448) Weight:  [205 lb 0.4 oz (93 kg)] 205 lb 0.4 oz (93 kg) (04/19 0448) Last BM Date: 05/03/17  Weight change: Filed Weights   05/02/17 0340 05/03/17 0435 05/04/17 0448  Weight: 206 lb 2.1 oz (93.5 kg) 204 lb 12.8 oz (92.9 kg) 205 lb 0.4 oz (93 kg)   Intake/Output:   Intake/Output Summary (Last 24 hours) at 05/04/2017 0722 Last data filed at 05/04/2017 0600 Gross per 24 hour  Intake 2108.16 ml  Output 2650 ml  Net -541.84 ml    Physical Exam   CVP 3-4 cm seated upright in chair.  General: Sitting on side of bed. NAD.  HEENT: Normal Anicteric  Neck: Supple. JVP not elevated. Carotids 2+ bilat; no bruits. No thyromegaly or nodule noted. Cor: PMI laterally displaced. Regular, slightly tachy. +S3. Lungs: CTAB, normal effort. Abdomen: Soft, non-tender, non-distended, no HSM. No bruits or masses. +BS  Extremities: No cyanosis, clubbing, or rash. R and LLE no edema. Warm   Neuro: alert & oriented x 3, cranial nerves grossly intact. moves all 4 extremities w/o difficulty. Affect pleasant   Telemetry    NSR 90-100s, personally reviewed.   EKG   No new tracings.    Labs    Basic Metabolic Panel: Recent Labs  Lab 04/30/17 0507 05/01/17 0516 05/01/17 0829 05/02/17 0428 05/03/17 0450 05/04/17 0400  NA 131*  --  130* 130* 131* 133*  K 3.8  --  3.7 4.1 3.8 3.8  CL 91*  --  94* 94* 96* 96*  CO2 31  --  27 26 25 26   GLUCOSE 110*  --  185* 130* 126* 162*  BUN 24*  --  25* 27* 31* 33*  CREATININE 1.72*  --  1.73* 1.85* 1.93* 1.92*  CALCIUM 8.8*  --  8.4* 8.7* 8.7* 9.0  MG 2.3 1.9  --  2.2 2.1 2.0   Liver Function Tests: No results for input(s): AST, ALT, ALKPHOS, BILITOT, PROT, ALBUMIN in the last 168 hours. No results for input(s): LIPASE, AMYLASE in the last 168 hours. No results for input(s): AMMONIA in the last 168 hours.  CBC: Recent Labs  Lab 04/30/17 0507 05/01/17 0516 05/02/17 0428 05/03/17 1232 05/04/17 0400  WBC 6.5 7.4 6.9 6.7 6.3  HGB 13.4 13.3 13.7 13.7 12.6*  HCT 39.5 39.5 41.3 41.2 39.2  MCV 85.5 84.6 85.9 85.7 85.8  PLT 102* 116* 127* 171 193   Cardiac Enzymes: No results for input(s): CKTOTAL, CKMB, CKMBINDEX, TROPONINI in the last 168 hours. BNP: BNP (last 3 results) Recent Labs    11/06/16 1621 03/25/17 1401 04/21/17 1212  BNP 961.6* 835.4* 483.0*   ProBNP (last 3 results) No results for input(s): PROBNP in the last 8760 hours.  Other results:  Imaging: No results found.  Medications:    Scheduled Medications: . Chlorhexidine Gluconate Cloth  6 each Topical Daily  . digoxin  0.125 mg Oral Daily  . insulin aspart  0-15 Units Subcutaneous TID WC  . insulin aspart  0-5 Units Subcutaneous QHS  . insulin glargine  10 Units Subcutaneous QHS  . sodium chloride flush  10-40 mL Intracatheter Q12H  . sodium chloride flush  3 mL Intravenous Q12H  . spironolactone  12.5 mg Oral Daily  . torsemide  40 mg Oral  Daily  . Warfarin - Pharmacist Dosing Inpatient   Does not apply q1800    Infusions: . sodium chloride Stopped (05/01/17 1001)  . sodium chloride Stopped (05/01/17 0800)  . amiodarone 30 mg/hr (05/04/17 0532)  . heparin 1,400 Units/hr (05/04/17 0532)  . milrinone 0.375 mcg/kg/min (05/03/17 2233)    PRN Medications:    Assessment:   Benjamin Sherman is a 47 y.o. 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 eosinophilic cardiomyopathy, LV thrombus, hypothyroidism, DM2, CKD Stage II-III and traumatic [facility] 11/18 after fall.   Admitted with cardiogenic shock.  Plan/Discussion:    1. Acute on chronic systolic HF -> cardiogenic shock: ECHO 11/2016 EF 25-30% CMRI EF 22%. Bedside echo in HF clinic EF 20% in 3/19.  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 Did not respond to steroids. Admitted 4/6 with cardiogenic shock and volume overload co-ox 38%. Echo 04/23/17: LVEF 20-25% with large LV clot, RV mild to moderately down. - IABP out. Swan pulled 05/01/17. - Coox 41.4% this am despite milrinone 0.375 mcg/kg/min.  - CVP 3-4. Will hold torsemide today to give a little more room. With component of RV failure, likely has narrow euvolemic window.  - Continue  digoxin 0.125.  - Continue spiro 12.5 mg daily. - No ACE/ARB/b-blocker with shock - Poor LVAD candidate with RV failure and renal failure.  Poor transplant candidate with lack of social support.   Repeat Echo to re-assess RV after optimization and on milrinone.  2. Atrial flutter: New onset. S/p DCCV to NSR on 4/12.  - Remains in NSR s/p DCCV.  - Continue amiodarone and heparin gtts for now. If able to maintain on lower dose of milrinone, will transition to po amiodarone.  - Coumadin started 05/01/17. Dosing per pharm D.  - Previously off Xarelto due to [facility].  3. AKI CKD Stage II-III: Likely ATN due to shock - Elevated but stable. Continue to follow.  4. H/o LV Thrombus - now recurrent on echo 04/23/17: Off  Xarelto due to previous SAH.  - Continue heparin.  - Coumadin started 05/01/17. Dosing per pharmacy. INR up to 1.16 today.  5. DMII - Cover with SSI plus lantus.  - No change to current plan.   6. Hypothyroidism: TFTs OK 04/22/17.  - No change to current plan.    Prognosis very concerning with worsening coox despite milrinone. Will discuss options with MD and VAD team.  Options at this point are likely High risk VAD vs Hospice. Picture complicated by RV dysfunction and limited social support. Will repeat Complete Echo this am  to re-assess RV to see if we think it would tolerate LVAD.   Graciella Freer, PA-C  05/04/2017 7:22 AM   Advanced Heart Failure Team Pager [phone] (M-F; 7a - 4p)  Please contact [facility] for night-coverage after hours (4p -7a ) and weekends on [email or web address]  Co-ox low this am on milrinone 0.375. However repeat co-ox 60% which was much improved. Volume status ok. He has failed milrinone wean. I do not think he will do well on home milrinone for very long. Will discuss with VAD team if he is candidate for durable VAD. Hold diuretics today. Continue amio and Xarelto for PAF. Will repeat echo to look at RV.   [name], MD  11:58 AM

## 2017-05-04 NOTE — Progress Notes (Signed)
VAD Coordinator note:  VAD evaluation consent reviewed and signed by pt. Initial VAD teaching completed with pt. VAD teaching packet left at bedside for their review. Patient asked questions and had good interaction with VAD coordinator; all questions answered regarding VAD implant,  hospital stay, and what to expect when discharged home. Pt identified Molly Maduro (best friend) as primary caregiver(s). Explained need for 24/7 care when pt discharged home; pt verbalized understanding of above.   Explained that LVAD can be implanted for two indications: 1. Bridge to transplant - used for patients who cannot safely wait for heart transplant.  2. Destination therapy - used for patients until end of life or recovery of heart function.  Caregiver not present for this conversation but will be at the hospital Tuesday at 1p to meet with our social worker to discuss VAD and social support.   Orders placed for evaluation, surgeons notified. We will continue to follow this pt.  [name] RN, BSN VAD Coordinator 24/7 Pager [phone]

## 2017-05-04 NOTE — Progress Notes (Signed)
Home Paraenteral Inotropic Therapy : Data Collection Form  Patients name: Benjamin Sherman   Date: 05/04/17  Information below may not be completed by the supplier nor anyone in a Financial relationship with the supplier.  1. Results of invasive hemodynamic monitoring  Cardiac Index Before Inotrope infusion:            1.28               On Inotrope infusion:            1.88               Drug and dose:   Milrinone 0.375 mcg/kg/min  2. Cardiac medications immediately prior to inotrope infusion (List name, dose, and frequency)  Spironolactone 12.5 mg daily, Digoxin 0.125 mg daily, Losartan 25 mg qhs, Lasix 40 mg daily.   3. Dose this represent maximum tolerated doses of these medications? Yes.   4. Breathing status Prior to inotrope infusion: Dyspnea at rest  At time of discharge: Dyspnea on moderate  exertion.   5. Initial home prescription Drug and Dose:   Milrinone 0.375 mcg/kg/min for continuous infusion 24/hr day and 7 days/week  6. If continuous infusion is prescribed, have attempts to discontinue inotrope infusion in the hospital failed?   Yes.   7. If intermittent infusion is prescribed, have there been repeated hospitalizations for heart failure which Parenteral inotrope were required? Not applicable.   8. Is patient capable of going to the physician for outpatient evaluation? Yes.   9. Is routine electrocardiographic monitoring required in the Home?  No.   The above statements and any additional explanations included separately are true and accurate and there is documentation present in the patients medical record to support these statements.   Completed by Graciella Freer, PA-C   In instances where this form was completed by an Advanced Practice Provider, please see EMR for physician Co-Signature.

## 2017-05-04 NOTE — Progress Notes (Signed)
ANTICOAGULATION CONSULT NOTE  Pharmacy Consult for Heparin, Warfarin Indication: atrial fibrillation/LV thrombus   Allergies  Allergen Reactions  . Bee Venom    Patient Measurements: Height: 6' (182.9 cm) Weight: 205 lb 0.4 oz (93 kg) IBW/kg (Calculated) : 77.6  Heparin dosing weight: 99 kg   Vital Signs: Temp: 98.3 F (36.8 C) (04/19 0736) Temp Source: Oral (04/19 0736) BP: 87/71 (04/19 0736) Pulse Rate: 96 (04/19 0736)  Labs: Recent Labs    05/02/17 0428 05/03/17 0450 05/03/17 1232 05/04/17 0400  HGB 13.7  --  13.7 12.6*  HCT 41.3  --  41.2 39.2  PLT 127*  --  171 193  LABPROT 14.2 14.2  --  14.7  INR 1.11 1.11  --  1.16  HEPARINUNFRC 0.41 0.38  --  0.50  CREATININE 1.85* 1.93*  --  1.92*   Assessment: 39 yoM with h/o HF admitted with volume overload and in cardiogenic shock. Started on heparin gtt for afib with RVR. No AC PTA - previously on rivaroxaban but fell, hit his head, and developed [facility] 11/18. Last head CT showed no increase hemorrhage. ECHO 4/8 showed LVEF 20-25% and large LV thrombus. Now s/p IABP removal. Hgb WNL, pltc stable. No bleeding noted. Heparin level is therapeutic at 0.38.   Note interaction with amiodarone - can enhance the anticoagulant effect of warfarin. Patient lives in [place] - will need to establish plan for INR follow up.   Baseline INR 1.14- warfarin started on 4/17. INR today is subtherapeutic at 1.16 - anticipate seeing warfarin doses starting tomorrow. Hgb 12.6, plts 193. No signs/symptoms of bleeding.   Goal of Therapy:  Heparin level 0.3-0.7 units/ml INR goal: 2-3 Monitor platelets by anticoagulation protocol: Yes   Plan:  Continue heparin drip at 1400 units/hour Give warfarin 7.5 mg PO x1 Daily heparin level, INR, and CBC Monitor for s/sx of bleeding  Girard Cooter, PharmD Clinical Pharmacist  Pager: [phone] Clinical Phone for 05/04/2017 until 3:30pm: [phone] If after 3:30pm, please call main pharmacy at  [phone]  05/04/2017 9:42 AM

## 2017-05-05 DIAGNOSIS — I480 Paroxysmal atrial fibrillation: Secondary | ICD-10-CM

## 2017-05-05 LAB — LIPID PANEL
Cholesterol: 156 mg/dL (ref 0–200)
HDL: 64 mg/dL (ref >40)
LDL Cholesterol: 79 mg/dL (ref 0–99)
Total CHOL/HDL Ratio: 2.4 RATIO
Triglycerides: 67 mg/dL (ref <150)
VLDL: 13 mg/dL (ref 0–40)

## 2017-05-05 LAB — PROTIME-INR
INR: 1.29
Prothrombin Time: 15.9 seconds — ABNORMAL HIGH (ref 11.4–15.2)

## 2017-05-05 LAB — BASIC METABOLIC PANEL
Anion gap: 10 (ref 5–15)
BUN: 28 mg/dL — ABNORMAL HIGH (ref 6–20)
CO2: 24 mmol/L (ref 22–32)
Calcium: 8.8 mg/dL — ABNORMAL LOW (ref 8.9–10.3)
Chloride: 98 mmol/L — ABNORMAL LOW (ref 101–111)
Creatinine, Ser: 1.77 mg/dL — ABNORMAL HIGH (ref 0.61–1.24)
GFR calc Af Amer: 51 mL/min — ABNORMAL LOW (ref >60)
GFR calc non Af Amer: 44 mL/min — ABNORMAL LOW (ref >60)
Glucose, Bld: 151 mg/dL — ABNORMAL HIGH (ref 65–99)
Potassium: 4 mmol/L (ref 3.5–5.1)
Sodium: 132 mmol/L — ABNORMAL LOW (ref 135–145)

## 2017-05-05 LAB — PREALBUMIN: Prealbumin: 13.5 mg/dL — ABNORMAL LOW (ref 18–38)

## 2017-05-05 LAB — CBC
HCT: 36.9 % — ABNORMAL LOW (ref 39.0–52.0)
Hemoglobin: 12 g/dL — ABNORMAL LOW (ref 13.0–17.0)
MCH: 27.8 pg (ref 26.0–34.0)
MCHC: 32.5 g/dL (ref 30.0–36.0)
MCV: 85.6 fL (ref 78.0–100.0)
Platelets: 194 10*3/uL (ref 150–400)
RBC: 4.31 MIL/uL (ref 4.22–5.81)
RDW: 15.6 % — ABNORMAL HIGH (ref 11.5–15.5)
WBC: 5.9 10*3/uL (ref 4.0–10.5)

## 2017-05-05 LAB — GLUCOSE, CAPILLARY
Glucose-Capillary: 152 mg/dL — ABNORMAL HIGH (ref 65–99)
Glucose-Capillary: 103 mg/dL — ABNORMAL HIGH (ref 65–99)
Glucose-Capillary: 99 mg/dL (ref 65–99)
Glucose-Capillary: 139 mg/dL — ABNORMAL HIGH (ref 65–99)

## 2017-05-05 LAB — LACTATE DEHYDROGENASE: LDH: 241 U/L — ABNORMAL HIGH (ref 98–192)

## 2017-05-05 LAB — COOXEMETRY PANEL
Carboxyhemoglobin: 0.9 % (ref 0.5–1.5)
Methemoglobin: 1.3 % (ref 0.0–1.5)
O2 Saturation: 50.3 %
Total hemoglobin: 12.2 g/dL (ref 12.0–16.0)

## 2017-05-05 LAB — HEPARIN LEVEL (UNFRACTIONATED): Heparin Unfractionated: 0.35 IU/mL (ref 0.30–0.70)

## 2017-05-05 LAB — ANTITHROMBIN III: AntiThromb III Func: 64 % — ABNORMAL LOW (ref 75–120)

## 2017-05-05 LAB — MAGNESIUM: Magnesium: 1.9 mg/dL (ref 1.7–2.4)

## 2017-05-05 LAB — URIC ACID: Uric Acid, Serum: 11.5 mg/dL — ABNORMAL HIGH (ref 4.4–7.6)

## 2017-05-05 LAB — HIV ANTIBODY (ROUTINE TESTING W REFLEX): HIV Screen 4th Generation wRfx: NONREACTIVE

## 2017-05-05 MED ORDER — DOXYCYCLINE HYCLATE 100 MG PO TABS
100.0000 mg | ORAL_TABLET | Freq: Two times a day (BID) | ORAL | Status: AC
  Administered 2017-05-05 – 2017-05-11 (×14): 100 mg via ORAL
  Filled 2017-05-05 (×14): qty 1

## 2017-05-05 MED ORDER — WARFARIN SODIUM 5 MG PO TABS: 5.0000 mg | ORAL_TABLET | Freq: Once | ORAL | Status: DC

## 2017-05-05 MED ORDER — FUROSEMIDE 10 MG/ML IJ SOLN
60.0000 mg | Freq: Two times a day (BID) | INTRAMUSCULAR | Status: DC
  Administered 2017-05-05 (×2): 60 mg via INTRAVENOUS
  Filled 2017-05-05 (×2): qty 6

## 2017-05-05 MED ORDER — PRO-STAT SUGAR FREE PO LIQD
30.0000 mL | Freq: Two times a day (BID) | ORAL | Status: DC
  Administered 2017-05-05 – 2017-05-14 (×16): 30 mL via ORAL
  Filled 2017-05-05 (×16): qty 30

## 2017-05-05 MED ORDER — GLUCERNA SHAKE PO LIQD
237.0000 mL | ORAL | Status: DC
  Administered 2017-05-07 – 2017-05-09 (×2): 237 mL via ORAL
  Filled 2017-05-05: qty 237

## 2017-05-05 NOTE — Progress Notes (Addendum)
Patient ID: Benjamin Sherman, male   DOB: Oct 06, 1970, 47 y.o.   MRN: [id number]    Advanced Heart Failure Rounding Note   Subjective:    Admitted 04/21/17 with cardiogenic shock and atrial flutter.   Central line placed. Initial co-ox 39%.  IABP placed 04/25/17-> 04/30/17 with persistent cardiogenic shock.   DCCV atrial flutter 4/12.  Maintaining NSR   Have been unable to wean milrinone due to recurrent shock. Now on 0.375. Co-ox 50%. Feels ok. Fatigued. No orthopnea or PND. Lasix held yesterday with CVP 3-4. Now back up to 15.    Echo 4/8: LVEF 20-25% with LV apical clot. RV mild to moderately down.  Repeat echo 4/19 unchanged RV moderately down   Creatinine 2.02 => 1.73=> 1.7 => 1.85 => 1.93=> 1.92 => 1.7  Objective:   Weight Range:  Vital Signs:   Temp:  [97.6 F (36.4 C)-99.1 F (37.3 C)] 98.4 F (36.9 C) (04/20 0746) Pulse Rate:  [92-101] 95 (04/20 0746) Resp:  [16-20] 18 (04/20 0442) BP: (88-112)/(56-84) 95/70 (04/20 0746) SpO2:  [99 %-100 %] 100 % (04/20 0746) Weight:  [92.9 kg (204 lb 14.4 oz)] 92.9 kg (204 lb 14.4 oz) (04/20 0442) Last BM Date: 05/03/17  Weight change: Filed Weights   05/03/17 0435 05/04/17 0448 05/05/17 0442  Weight: 92.9 kg (204 lb 12.8 oz) 93 kg (205 lb 0.4 oz) 92.9 kg (204 lb 14.4 oz)   Intake/Output:   Intake/Output Summary (Last 24 hours) at 05/05/2017 0905 Last data filed at 05/05/2017 0631 Gross per 24 hour  Intake 1855.54 ml  Output 1100 ml  Net 755.54 ml    Physical Exam   General: Sitting in chair No resp difficulty HEENT: normal Neck: supple. JVP to jaw. RIJ TLC Carotids 2+ bilat; no bruits. No lymphadenopathy or thryomegaly appreciated. Cor: PMI laterally displaced. Regular rate & rhythm. +s3 Lungs: clear Abdomen: soft, nontender, nondistended. No hepatosplenomegaly. No bruits or masses. Good bowel sounds. Extremities: no cyanosis, clubbing, rash, edema  Erythema on R ankle area. No joint swelling Neuro: alert & orientedx3,  cranial nerves grossly intact. moves all 4 extremities w/o difficulty. Affect pleasant   Telemetry    NSR 90-100s, personally reviewed.   EKG   No new tracings.    Labs    Basic Metabolic Panel: Recent Labs  Lab 05/01/17 0516 05/01/17 0829 05/02/17 0428 05/03/17 0450 05/04/17 0400 05/05/17 0434  NA  --  130* 130* 131* 133* 132*  K  --  3.7 4.1 3.8 3.8 4.0  CL  --  94* 94* 96* 96* 98*  CO2  --  27 26 25 26 24   GLUCOSE  --  185* 130* 126* 162* 151*  BUN  --  25* 27* 31* 33* 28*  CREATININE  --  1.73* 1.85* 1.93* 1.92* 1.77*  CALCIUM  --  8.4* 8.7* 8.7* 9.0 8.8*  MG 1.9  --  2.2 2.1 2.0 1.9   Liver Function Tests: No results for input(s): AST, ALT, ALKPHOS, BILITOT, PROT, ALBUMIN in the last 168 hours. No results for input(s): LIPASE, AMYLASE in the last 168 hours. No results for input(s): AMMONIA in the last 168 hours.  CBC: Recent Labs  Lab 05/01/17 0516 05/02/17 0428 05/03/17 1232 05/04/17 0400 05/05/17 0434  WBC 7.4 6.9 6.7 6.3 5.9  HGB 13.3 13.7 13.7 12.6* 12.0*  HCT 39.5 41.3 41.2 39.2 36.9*  MCV 84.6 85.9 85.7 85.8 85.6  PLT 116* 127* 171 193 194   Cardiac Enzymes: No results for input(s):  CKTOTAL, CKMB, CKMBINDEX, TROPONINI in the last 168 hours. BNP: BNP (last 3 results) Recent Labs    11/06/16 1621 03/25/17 1401 04/21/17 1212  BNP 961.6* 835.4* 483.0*   ProBNP (last 3 results) No results for input(s): PROBNP in the last 8760 hours.  Other results:  Imaging: Ct Abdomen Pelvis Wo Contrast  Result Date: 05/04/2017 CLINICAL DATA:  Heart failure.  Evaluate for LVAD EXAM: CT CHEST, ABDOMEN AND PELVIS WITHOUT CONTRAST TECHNIQUE: Multidetector CT imaging of the chest, abdomen and pelvis was performed following the standard protocol without IV contrast. COMPARISON:  Chest radiograph chest CT 04/21/2017 FINDINGS: CT CHEST FINDINGS Cardiovascular: No significant vascular findings. Normal heart size. No pericardial effusion. Mediastinum/Nodes: No  axillary supraclavicular adenopathy. No mediastinal hilar adenopathy. Lungs/Pleura: Band of atelectasis along the RIGHT horizontal fissure. Band of atelectasis at the RIGHT lung base is unchanged. Small focus of airspace disease in the LEFT lower lobe (image 93/5). Small LEFT lower lobe ground-glass nodule on image 100/5 Several ground-glass nodules in the RIGHT lower lobe (image 81/5. Musculoskeletal: No aggressive osseous lesion. CT ABDOMEN AND PELVIS FINDINGS Hepatobiliary: No focal hepatic lesion. No biliary ductal dilatation. Gallbladder is normal. Common bile duct is normal. Pancreas: Pancreas is normal. No ductal dilatation. No pancreatic inflammation. Spleen: Normal spleen Adrenals/urinary tract: Adrenal glands and kidneys are normal. The ureters and bladder normal. Stomach/Bowel: Stomach, small bowel, appendix, and cecum are normal. The colon and rectosigmoid colon are normal. Vascular/Lymphatic: Abdominal aorta is normal caliber. There is no retroperitoneal or periportal lymphadenopathy. No pelvic lymphadenopathy. Reproductive: Prostate normal Other: No free fluid. Musculoskeletal: No aggressive osseous lesion. IMPRESSION: Chest Impression: *Several new foci of ground-glass nodularity in the LEFT lower lobe and RIGHT lower lobe suggests mild pulmonary infection. *Persistent atelectasis in the RIGHT middle lobe and RIGHT lower lobe. Abdomen / Pelvis Impression: 1. No acute abdominopelvic findings. 2. Normal anatomy. Electronically Signed   By: Genevive Bi M.D.   On: 05/04/2017 21:59   Dg Orthopantogram  Result Date: 05/04/2017 CLINICAL DATA:  Acute on chronic systolic heart failure needing possible left ventricular assist device. EXAM: ORTHOPANTOGRAM/PANORAMIC COMPARISON:  None. FINDINGS: A single orthopantogram was provided demonstrating periapical lucencies of the remaining upper teeth consistent with periodontal disease. Periapical lucencies also noted about the left lower central incisor and  left lower bicuspid large pocket between the left bicuspid and first molar. Both lower wisdom teeth are lateral to the normal axis. The left upper canine demonstrates bone loss involving the root. IMPRESSION: Periodontal disease as above. Electronically Signed   By: Tollie Eth M.D.   On: 05/04/2017 22:36   Ct Chest Wo Contrast  Result Date: 05/04/2017 CLINICAL DATA:  Heart failure.  Evaluate for LVAD EXAM: CT CHEST, ABDOMEN AND PELVIS WITHOUT CONTRAST TECHNIQUE: Multidetector CT imaging of the chest, abdomen and pelvis was performed following the standard protocol without IV contrast. COMPARISON:  Chest radiograph chest CT 04/21/2017 FINDINGS: CT CHEST FINDINGS Cardiovascular: No significant vascular findings. Normal heart size. No pericardial effusion. Mediastinum/Nodes: No axillary supraclavicular adenopathy. No mediastinal hilar adenopathy. Lungs/Pleura: Band of atelectasis along the RIGHT horizontal fissure. Band of atelectasis at the RIGHT lung base is unchanged. Small focus of airspace disease in the LEFT lower lobe (image 93/5). Small LEFT lower lobe ground-glass nodule on image 100/5 Several ground-glass nodules in the RIGHT lower lobe (image 81/5. Musculoskeletal: No aggressive osseous lesion. CT ABDOMEN AND PELVIS FINDINGS Hepatobiliary: No focal hepatic lesion. No biliary ductal dilatation. Gallbladder is normal. Common bile duct is normal. Pancreas: Pancreas is  normal. No ductal dilatation. No pancreatic inflammation. Spleen: Normal spleen Adrenals/urinary tract: Adrenal glands and kidneys are normal. The ureters and bladder normal. Stomach/Bowel: Stomach, small bowel, appendix, and cecum are normal. The colon and rectosigmoid colon are normal. Vascular/Lymphatic: Abdominal aorta is normal caliber. There is no retroperitoneal or periportal lymphadenopathy. No pelvic lymphadenopathy. Reproductive: Prostate normal Other: No free fluid. Musculoskeletal: No aggressive osseous lesion. IMPRESSION: Chest  Impression: *Several new foci of ground-glass nodularity in the LEFT lower lobe and RIGHT lower lobe suggests mild pulmonary infection. *Persistent atelectasis in the RIGHT middle lobe and RIGHT lower lobe. Abdomen / Pelvis Impression: 1. No acute abdominopelvic findings. 2. Normal anatomy. Electronically Signed   By: Genevive Bi M.D.   On: 05/04/2017 21:59    Medications:    Scheduled Medications: . Chlorhexidine Gluconate Cloth  6 each Topical Daily  . digoxin  0.125 mg Oral Daily  . insulin aspart  0-15 Units Subcutaneous TID WC  . insulin aspart  0-5 Units Subcutaneous QHS  . insulin glargine  10 Units Subcutaneous QHS  . sodium chloride flush  10-40 mL Intracatheter Q12H  . sodium chloride flush  3 mL Intravenous Q12H  . spironolactone  12.5 mg Oral Daily  . warfarin  5 mg Oral ONCE-1800  . Warfarin - Pharmacist Dosing Inpatient   Does not apply q1800    Infusions: . sodium chloride Stopped (05/01/17 1001)  . sodium chloride Stopped (05/01/17 0800)  . sodium chloride    . amiodarone 30 mg/hr (05/05/17 0631)  . heparin 1,400 Units/hr (05/05/17 0631)  . milrinone 0.375 mcg/kg/min (05/05/17 0631)    PRN Medications:    Assessment:   Davante Beitler is a 47 y.o. 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 eosinophilic cardiomyopathy, LV thrombus, hypothyroidism, DM2, CKD Stage II-III and traumatic [facility] 11/18 after fall.   Admitted with cardiogenic shock.  Plan/Discussion:    1. Acute on chronic systolic HF -> cardiogenic shock: ECHO 11/2016 EF 25-30% CMRI EF 22%. Bedside echo in HF clinic EF 20% in 3/19.  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 Did not respond to steroids. Admitted 4/6 with cardiogenic shock and volume overload co-ox 38%. Echo 04/23/17: LVEF 20-25% with large LV clot, RV mild to moderately down. - IABP out. Swan pulled 05/01/17. - Coox 50% this am despite milrinone 0.375 mcg/kg/min.  - CVP up to 15 after holding  lasix yesterday for CVP 3-4   - Restart IV lasix.  - Continue  digoxin 0.125.  - Continue spiro 12.5 mg daily. - No ACE/ARB/b-blocker with shock - Echo reviewed personally with LVEF 20% moderate RV dysfunction   - We have been unable to wean inotropic support and even on fairly high-dose milrinone he remains very tenuous. I think he will die soon if there is no durable option for him. Not transplant candidate due to limited support. VAD team now evaluating. Possibly post for Thursday. If accepted will need IABP back in next week. D/w Dr. Donata Clay personally this am.  2. Atrial flutter: New onset. S/p DCCV to NSR on 4/12.  - Remains in NSR s/p DCCV.  - Continue amiodarone and heparin gtts for now pending possible IABP/VAD later this week  - INR 1.29. Stop warafarin - Previously off Xarelto due to [facility].  3. AKI CKD Stage II-III: Likely ATN due to shock - Improved today with increased milrinone dosing.  4. H/o LV Thrombus - now recurrent on echo 04/23/17: Off Xarelto due to previous  SAH.  - Continue heparin. Stop warfarin  5. DMII - Cover with SSI plus lantus.  - No change to current plan.   6. Hypothyroidism: TFTs OK 04/22/17.  - No change to current plan.   7. RLE erythema/cellulitis - start doxy  [name], MD  05/05/2017 9:05 AM   Advanced Heart Failure Team Pager [phone] (M-F; 7a - 4p)  Please contact [facility] for night-coverage after hours (4p -7a ) and weekends on [email or web address]

## 2017-05-05 NOTE — Plan of Care (Signed)
  Problem: Pain Managment: Goal: General experience of comfort will improve Outcome: Completed/Met  Pt denies pain. Displays no signs or symptoms of distress 

## 2017-05-05 NOTE — Progress Notes (Signed)
ANTICOAGULATION CONSULT NOTE  Pharmacy Consult for Heparin, Warfarin Indication: atrial fibrillation/LV thrombus   Allergies  Allergen Reactions  . Bee Venom    Patient Measurements: Height: 6' (182.9 cm) Weight: 204 lb 14.4 oz (92.9 kg) IBW/kg (Calculated) : 77.6  Heparin dosing weight: 99 kg   Vital Signs: Temp: 98.4 F (36.9 C) (04/20 0746) Temp Source: Oral (04/20 0746) BP: 95/70 (04/20 0746) Pulse Rate: 95 (04/20 0746)  Labs: Recent Labs    05/03/17 0450  05/03/17 1232 05/04/17 0400 05/05/17 0434  HGB  --    < > 13.7 12.6* 12.0*  HCT  --   --  41.2 39.2 36.9*  PLT  --   --  171 193 194  LABPROT 14.2  --   --  14.7 15.9*  INR 1.11  --   --  1.16 1.29  HEPARINUNFRC 0.38  --   --  0.50 0.35  CREATININE 1.93*  --   --  1.92* 1.77*   < > = values in this interval not displayed.   Assessment: 76 yoM with h/o HF admitted with volume overload and in cardiogenic shock. Started on heparin gtt for afib with RVR. No AC PTA - previously on rivaroxaban but fell, hit his head, and developed [facility] 11/18. Last head CT showed no increase hemorrhage. ECHO 4/8 showed LVEF 20-25% and large LV thrombus. Now s/p IABP removal.  Note interaction with amiodarone - can enhance the anticoagulant effect of warfarin. Patient lives in [place] - will need to establish plan for INR follow up.   Heparin level therapeutic at 0.35 on 1400 units/hr, and INR subtherapeutic at 1.29 s/p 3 doses of warfarin.  CBC low but stable, plts wnl and no bleeding noted.  Goal of Therapy:  Heparin level 0.3-0.7 units/ml INR goal: 2-3 Monitor platelets by anticoagulation protocol: Yes   Plan:  Continue heparin gtt @ 1400 units/hr Give warfarin 5mg  x 1 tonight Monitor daily INR, heparin level, CBC and s/s bleeding  Daylene Posey, PharmD Pharmacy Resident Pager #: [phone] 05/05/2017 8:16 AM

## 2017-05-05 NOTE — Progress Notes (Addendum)
Nutrition Follow-up  DOCUMENTATION CODES:  Not applicable  INTERVENTION:  Ordered HS snack  Glucerna Shake q24 (d/t 1.5 L restrict), each supplement provides 220 kcal and 10 grams of protein  Will order 30 mL Prostat BID, each supplement provides 100 kcal and 15 grams of protein.  Verified patient willing to adopt healthy diet/lifestyle changes if undergoes LVAD operation  Reviewed diet recommendations to improve diabetic controle.  If patient is decided to be candidate for LVAD, would also recommend consult with diabetic coordinator to help optimize diabetic control/improve outcomes. He was not checking his sugars at home.  NUTRITION DIAGNOSIS:  Increased nutrient needs related to catabolic illness(End stage HF) as evidenced by estimated nutritional requirements for this condition  GOAL:  Patient will meet greater than or equal to 90% of their needs  MONITOR:  PO intake, Supplement acceptance, Labs, Weight trends, I & O's  REASON FOR ASSESSMENT:  Consult LVAD Eval  ASSESSMENT:  47 y/o make PMHx CHF, DM2, SAH, CKD2-3. Patient had been diagnosed with HF (ef 15%) end of last year. Presented to [facility] 4/6 w/ SOB and BLE edema x 2.5 days.  Worked up for Cardiogenic shock in setting of decompensated, worsening heart failure. He is not a transplant candidate and poor candidate for LVAD due to social situation & RV dysfunction. VAD team evaluating. Options are hospice vs high risk VAD. RD consulted for nutrition evaluation for potential LVAD.   Pt seen standing up in room, attempting to use the wall to help him fasten his watch.   RD reviewed diet behaviors PTA. At baseline, he was not taking any vitamins, supplements and admits to not following any type of therapeutic diet.He drank approximately 4 regular sodas/day as well as juice.  He did not check his blood sugars. He has not had any changes in his appetite recently and still reports having a good appetite. Wt wise, he reports a UBW of  around 200. He has been as high as 220 lbs, but says this was due to swelling.   Since admission to hospital, patient has demonstrated an optimal diet, consuming 100% of nearly every meal that has been documented on. He says he is getting hungry in between Dinner and bedtime. He was agreeable to an HS snack.   Going forward, RD asked if he felt he would be able to adhere to healthy diet changes if he received an LVAD, namely: adopt a low salt diet, follow any MD recommended fluid restrictions, improve diabetic control by eliminating sugar sweetened beverages/juice, check his blood sugars as directed and eat vegetables and lean proteins with meals. He felt that he could do all of these things.   RD also asked if he had any diet related questions at this time. He reports he has already received much information on the HF diet and does not have any questions.   Physical Exam: Mild muscle wasting temporalis, clavicular musculature. Mild fat wasting orbitals.   Labs: BGs 100-160. 4/7: A1C 7.5. Prealbumin: 13.5. Albumin:2.4  Meds: PO abx, IV lasix, Insulin, Spironolactone. Iv amio/milrinone,   Recent Labs  Lab 05/03/17 0450 05/04/17 0400 05/05/17 0434  NA 131* 133* 132*  K 3.8 3.8 4.0  CL 96* 96* 98*  CO2 25 26 24   BUN 31* 33* 28*  CREATININE 1.93* 1.92* 1.77*  CALCIUM 8.7* 9.0 8.8*  MG 2.1 2.0 1.9  GLUCOSE 126* 162* 151*    NUTRITION - FOCUSED PHYSICAL EXAM:   Most Recent Value  Orbital Region  Mild depletion  Upper Arm Region  No depletion  Thoracic and Lumbar Region  No depletion  Buccal Region  No depletion  Temple Region  Mild depletion  Clavicle Bone Region  Mild depletion  Clavicle and Acromion Bone Region  No depletion  Scapular Bone Region  No depletion  Dorsal Hand  No depletion  Patellar Region  No depletion  Anterior Thigh Region  Unable to assess  Posterior Calf Region  Unable to assess     Diet Order:  Diet heart healthy/carb modified Room service appropriate?  Yes; Fluid consistency: Thin  EDUCATION NEEDS:  Education needs have been addressed  Skin:  Excoriated legs  Last BM:  4/18  Height:  Ht Readings from Last 1 Encounters:  04/21/17 6' (1.829 m)   Weight:  Wt Readings from Last 1 Encounters:  05/05/17 204 lb 14.4 oz (92.9 kg)   Wt Readings from Last 10 Encounters:  05/05/17 204 lb 14.4 oz (92.9 kg)  03/27/17 228 lb 3.2 oz (103.5 kg)  12/13/16 197 lb 5 oz (89.5 kg)  11/28/16 207 lb 12.8 oz (94.3 kg)  11/16/16 206 lb 4.8 oz (93.6 kg)   Ideal Body Weight:  80.91 kg  BMI:  Body mass index is 27.79 kg/m.  Estimated Nutritional Needs:  Kcal:  2200-2400 kcals (24-26 kcal/kg bw) Protein:  110-130g Pro (1.2-1.4 g/kg bw) Fluid:  <1.5 L (MD ordered Fluid restriction)  Christophe Louis RD, LDN, CNSC Clinical Nutrition Available Tues-Sat via Pager: [phone] 05/05/2017 5:02 PM

## 2017-05-06 ENCOUNTER — Inpatient Hospital Stay (HOSPITAL_COMMUNITY): Payer: Medicaid Other

## 2017-05-06 DIAGNOSIS — Z7189 Other specified counseling: Secondary | ICD-10-CM

## 2017-05-06 DIAGNOSIS — Z01810 Encounter for preprocedural cardiovascular examination: Secondary | ICD-10-CM

## 2017-05-06 DIAGNOSIS — I5023 Acute on chronic systolic (congestive) heart failure: Secondary | ICD-10-CM

## 2017-05-06 DIAGNOSIS — Z515 Encounter for palliative care: Secondary | ICD-10-CM

## 2017-05-06 LAB — BASIC METABOLIC PANEL
Anion gap: 9 (ref 5–15)
BUN: 28 mg/dL — ABNORMAL HIGH (ref 6–20)
CO2: 24 mmol/L (ref 22–32)
Calcium: 8.8 mg/dL — ABNORMAL LOW (ref 8.9–10.3)
Chloride: 99 mmol/L — ABNORMAL LOW (ref 101–111)
Creatinine, Ser: 1.85 mg/dL — ABNORMAL HIGH (ref 0.61–1.24)
GFR calc Af Amer: 48 mL/min — ABNORMAL LOW (ref >60)
GFR calc non Af Amer: 41 mL/min — ABNORMAL LOW (ref >60)
Glucose, Bld: 123 mg/dL — ABNORMAL HIGH (ref 65–99)
Potassium: 3.9 mmol/L (ref 3.5–5.1)
Sodium: 132 mmol/L — ABNORMAL LOW (ref 135–145)

## 2017-05-06 LAB — PROTIME-INR
INR: 1.45
Prothrombin Time: 17.5 seconds — ABNORMAL HIGH (ref 11.4–15.2)

## 2017-05-06 LAB — CBC
HCT: 36.5 % — ABNORMAL LOW (ref 39.0–52.0)
Hemoglobin: 11.9 g/dL — ABNORMAL LOW (ref 13.0–17.0)
MCH: 27.9 pg (ref 26.0–34.0)
MCHC: 32.6 g/dL (ref 30.0–36.0)
MCV: 85.5 fL (ref 78.0–100.0)
Platelets: 229 10*3/uL (ref 150–400)
RBC: 4.27 MIL/uL (ref 4.22–5.81)
RDW: 15.7 % — ABNORMAL HIGH (ref 11.5–15.5)
WBC: 6.5 10*3/uL (ref 4.0–10.5)

## 2017-05-06 LAB — COOXEMETRY PANEL
Carboxyhemoglobin: 1.3 % (ref 0.5–1.5)
Methemoglobin: 1.1 % (ref 0.0–1.5)
O2 Saturation: 89.6 %
Total hemoglobin: 12.4 g/dL (ref 12.0–16.0)

## 2017-05-06 LAB — HEPARIN LEVEL (UNFRACTIONATED): Heparin Unfractionated: 0.31 IU/mL (ref 0.30–0.70)

## 2017-05-06 LAB — MAGNESIUM: Magnesium: 1.8 mg/dL (ref 1.7–2.4)

## 2017-05-06 LAB — GLUCOSE, CAPILLARY
Glucose-Capillary: 104 mg/dL — ABNORMAL HIGH (ref 65–99)
Glucose-Capillary: 148 mg/dL — ABNORMAL HIGH (ref 65–99)
Glucose-Capillary: 132 mg/dL — ABNORMAL HIGH (ref 65–99)
Glucose-Capillary: 111 mg/dL — ABNORMAL HIGH (ref 65–99)

## 2017-05-06 MED ORDER — FUROSEMIDE 10 MG/ML IJ SOLN
60.0000 mg | Freq: Every day | INTRAMUSCULAR | Status: DC
  Administered 2017-05-06 – 2017-05-07 (×2): 60 mg via INTRAVENOUS
  Filled 2017-05-06 (×2): qty 6

## 2017-05-06 NOTE — Progress Notes (Signed)
Patient remained hemodynamically stable in NSR during the night.

## 2017-05-06 NOTE — Progress Notes (Signed)
VASCULAR LAB PRELIMINARY  PRELIMINARY  PRELIMINARY  PRELIMINARY  Bilateral lower extremity venous duplex completed.    Preliminary report:  There is no DVT or SVT noted in the bilateral lower extremities.  Rouleaux flow noted throughout the bilateral lower extremities.   Branko Steeves, RVT 05/06/2017, 7:28 PM

## 2017-05-06 NOTE — Progress Notes (Signed)
Patient ID: Woodfin Kiss, male   DOB: [birth year], 47 y.o.   MRN: [id number]    Advanced Heart Failure Rounding Note   Subjective:    Admitted 04/21/17 with cardiogenic shock and atrial flutter.   Central line placed. Initial co-ox 39%.  IABP placed 04/25/17-> 04/30/17 with persistent cardiogenic shock.   DCCV atrial flutter 4/12.  Maintaining NSR   Have been unable to wean milrinone due to recurrent shock. Now on 0.375. Co-ox inaccurate at 90%. BAckl on IV lasix. Feels ok. Denies SOB, PND or orthopnea. Says slept the best he has in a long time last night. Creatinine relatively stable at 1.8 Weight down 1 pound. CVP 5-6   Echo 4/8: LVEF 20-25% with LV apical clot. RV mild to moderately down.  Repeat echo 4/19 unchanged RV moderately down   Creatinine 2.02 => 1.73=> 1.7 => 1.85 => 1.93=> 1.92 => 1.7 +> 1.8  Objective:   Weight Range:  Vital Signs:   Temp:  [98.1 F (36.7 C)-98.6 F (37 C)] 98.1 F (36.7 C) (04/21 0748) Pulse Rate:  [89-98] 90 (04/21 0748) Resp:  [20-28] 20 (04/21 0458) BP: (89-110)/(59-85) 89/70 (04/21 0748) SpO2:  [86 %-100 %] 100 % (04/21 0748) Weight:  [92.2 kg (203 lb 3.2 oz)] 92.2 kg (203 lb 3.2 oz) (04/21 0458) Last BM Date: 05/05/17  Weight change: Filed Weights   05/04/17 0448 05/05/17 0442 05/06/17 0458  Weight: 93 kg (205 lb 0.4 oz) 92.9 kg (204 lb 14.4 oz) 92.2 kg (203 lb 3.2 oz)   Intake/Output:   Intake/Output Summary (Last 24 hours) at 05/06/2017 0827 Last data filed at 05/06/2017 0100 Gross per 24 hour  Intake 1837.36 ml  Output 2200 ml  Net -362.64 ml    Physical Exam   General:  Sitting in chair . No resp difficulty HEENT: normal Neck: supple. JVP 5-6. RIJ triple lumen Carotids 2+ bilat; no bruits. No lymphadenopathy or thryomegaly appreciated. Cor: PMI laterally displaced. Regular rate & rhythm.+s3 Lungs: clear Abdomen: soft, nontender, nondistended. No hepatosplenomegaly. No bruits or masses. Good bowel sounds. Extremities: no  cyanosis, clubbing, rash, edema mild erythema on R ankle (improved) Neuro: alert & orientedx3, cranial nerves grossly intact. moves all 4 extremities w/o difficulty. Affect pleasant  Telemetry    NSR 90s, personally reviewed.   EKG   No new tracings.    Labs    Basic Metabolic Panel: Recent Labs  Lab 05/02/17 0428 05/03/17 0450 05/04/17 0400 05/05/17 0434 05/06/17 0454  NA 130* 131* 133* 132* 132*  K 4.1 3.8 3.8 4.0 3.9  CL 94* 96* 96* 98* 99*  CO2 26 25 26 24 24   GLUCOSE 130* 126* 162* 151* 123*  BUN 27* 31* 33* 28* 28*  CREATININE 1.85* 1.93* 1.92* 1.77* 1.85*  CALCIUM 8.7* 8.7* 9.0 8.8* 8.8*  MG 2.2 2.1 2.0 1.9 1.8   Liver Function Tests: No results for input(s): AST, ALT, ALKPHOS, BILITOT, PROT, ALBUMIN in the last 168 hours. No results for input(s): LIPASE, AMYLASE in the last 168 hours. No results for input(s): AMMONIA in the last 168 hours.  CBC: Recent Labs  Lab 05/02/17 0428 05/03/17 1232 05/04/17 0400 05/05/17 0434 05/06/17 0454  WBC 6.9 6.7 6.3 5.9 6.5  HGB 13.7 13.7 12.6* 12.0* 11.9*  HCT 41.3 41.2 39.2 36.9* 36.5*  MCV 85.9 85.7 85.8 85.6 85.5  PLT 127* 171 193 194 229   Cardiac Enzymes: No results for input(s): CKTOTAL, CKMB, CKMBINDEX, TROPONINI in the last 168 hours. BNP: BNP (last 3  results) Recent Labs    11/06/16 1621 03/25/17 1401 04/21/17 1212  BNP 961.6* 835.4* 483.0*   ProBNP (last 3 results) No results for input(s): PROBNP in the last 8760 hours.  Other results:  Imaging: Ct Abdomen Pelvis Wo Contrast  Result Date: 05/04/2017 CLINICAL DATA:  Heart failure.  Evaluate for LVAD EXAM: CT CHEST, ABDOMEN AND PELVIS WITHOUT CONTRAST TECHNIQUE: Multidetector CT imaging of the chest, abdomen and pelvis was performed following the standard protocol without IV contrast. COMPARISON:  Chest radiograph chest CT 04/21/2017 FINDINGS: CT CHEST FINDINGS Cardiovascular: No significant vascular findings. Normal heart size. No pericardial  effusion. Mediastinum/Nodes: No axillary supraclavicular adenopathy. No mediastinal hilar adenopathy. Lungs/Pleura: Band of atelectasis along the RIGHT horizontal fissure. Band of atelectasis at the RIGHT lung base is unchanged. Small focus of airspace disease in the LEFT lower lobe (image 93/5). Small LEFT lower lobe ground-glass nodule on image 100/5 Several ground-glass nodules in the RIGHT lower lobe (image 81/5. Musculoskeletal: No aggressive osseous lesion. CT ABDOMEN AND PELVIS FINDINGS Hepatobiliary: No focal hepatic lesion. No biliary ductal dilatation. Gallbladder is normal. Common bile duct is normal. Pancreas: Pancreas is normal. No ductal dilatation. No pancreatic inflammation. Spleen: Normal spleen Adrenals/urinary tract: Adrenal glands and kidneys are normal. The ureters and bladder normal. Stomach/Bowel: Stomach, small bowel, appendix, and cecum are normal. The colon and rectosigmoid colon are normal. Vascular/Lymphatic: Abdominal aorta is normal caliber. There is no retroperitoneal or periportal lymphadenopathy. No pelvic lymphadenopathy. Reproductive: Prostate normal Other: No free fluid. Musculoskeletal: No aggressive osseous lesion. IMPRESSION: Chest Impression: *Several new foci of ground-glass nodularity in the LEFT lower lobe and RIGHT lower lobe suggests mild pulmonary infection. *Persistent atelectasis in the RIGHT middle lobe and RIGHT lower lobe. Abdomen / Pelvis Impression: 1. No acute abdominopelvic findings. 2. Normal anatomy. Electronically Signed   By: Genevive Bi M.D.   On: 05/04/2017 21:59   Dg Orthopantogram  Result Date: 05/04/2017 CLINICAL DATA:  Acute on chronic systolic heart failure needing possible left ventricular assist device. EXAM: ORTHOPANTOGRAM/PANORAMIC COMPARISON:  None. FINDINGS: A single orthopantogram was provided demonstrating periapical lucencies of the remaining upper teeth consistent with periodontal disease. Periapical lucencies also noted about the  left lower central incisor and left lower bicuspid large pocket between the left bicuspid and first molar. Both lower wisdom teeth are lateral to the normal axis. The left upper canine demonstrates bone loss involving the root. IMPRESSION: Periodontal disease as above. Electronically Signed   By: Tollie Eth M.D.   On: 05/04/2017 22:36   Ct Chest Wo Contrast  Result Date: 05/04/2017 CLINICAL DATA:  Heart failure.  Evaluate for LVAD EXAM: CT CHEST, ABDOMEN AND PELVIS WITHOUT CONTRAST TECHNIQUE: Multidetector CT imaging of the chest, abdomen and pelvis was performed following the standard protocol without IV contrast. COMPARISON:  Chest radiograph chest CT 04/21/2017 FINDINGS: CT CHEST FINDINGS Cardiovascular: No significant vascular findings. Normal heart size. No pericardial effusion. Mediastinum/Nodes: No axillary supraclavicular adenopathy. No mediastinal hilar adenopathy. Lungs/Pleura: Band of atelectasis along the RIGHT horizontal fissure. Band of atelectasis at the RIGHT lung base is unchanged. Small focus of airspace disease in the LEFT lower lobe (image 93/5). Small LEFT lower lobe ground-glass nodule on image 100/5 Several ground-glass nodules in the RIGHT lower lobe (image 81/5. Musculoskeletal: No aggressive osseous lesion. CT ABDOMEN AND PELVIS FINDINGS Hepatobiliary: No focal hepatic lesion. No biliary ductal dilatation. Gallbladder is normal. Common bile duct is normal. Pancreas: Pancreas is normal. No ductal dilatation. No pancreatic inflammation. Spleen: Normal spleen Adrenals/urinary tract: Adrenal  glands and kidneys are normal. The ureters and bladder normal. Stomach/Bowel: Stomach, small bowel, appendix, and cecum are normal. The colon and rectosigmoid colon are normal. Vascular/Lymphatic: Abdominal aorta is normal caliber. There is no retroperitoneal or periportal lymphadenopathy. No pelvic lymphadenopathy. Reproductive: Prostate normal Other: No free fluid. Musculoskeletal: No aggressive  osseous lesion. IMPRESSION: Chest Impression: *Several new foci of ground-glass nodularity in the LEFT lower lobe and RIGHT lower lobe suggests mild pulmonary infection. *Persistent atelectasis in the RIGHT middle lobe and RIGHT lower lobe. Abdomen / Pelvis Impression: 1. No acute abdominopelvic findings. 2. Normal anatomy. Electronically Signed   By: Genevive Bi M.D.   On: 05/04/2017 21:59    Medications:    Scheduled Medications: . Chlorhexidine Gluconate Cloth  6 each Topical Daily  . digoxin  0.125 mg Oral Daily  . doxycycline  100 mg Oral Q12H  . feeding supplement (GLUCERNA SHAKE)  237 mL Oral Q24H  . feeding supplement (PRO-STAT SUGAR FREE 64)  30 mL Oral BID  . furosemide  60 mg Intravenous BID  . insulin aspart  0-15 Units Subcutaneous TID WC  . insulin aspart  0-5 Units Subcutaneous QHS  . insulin glargine  10 Units Subcutaneous QHS  . sodium chloride flush  10-40 mL Intracatheter Q12H  . sodium chloride flush  3 mL Intravenous Q12H  . spironolactone  12.5 mg Oral Daily    Infusions: . sodium chloride Stopped (05/01/17 1001)  . sodium chloride Stopped (05/01/17 0800)  . sodium chloride    . amiodarone 30 mg/hr (05/06/17 0100)  . heparin 1,400 Units/hr (05/06/17 0100)  . milrinone 0.375 mcg/kg/min (05/06/17 0100)    PRN Medications:    Assessment:   Jarquis Walker is a 47 y.o. 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 eosinophilic cardiomyopathy, LV thrombus, hypothyroidism, DM2, CKD Stage II-III and traumatic [facility] 11/18 after fall.   Admitted with cardiogenic shock.  Plan/Discussion:    1. Acute on chronic systolic HF -> cardiogenic shock: ECHO 11/2016 EF 25-30% CMRI EF 22%. Bedside echo in HF clinic EF 20% in 3/19.  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 Did not respond to steroids. Admitted 4/6 with cardiogenic shock and volume overload co-ox 38%. Echo 04/23/17: LVEF 20-25% with large LV clot, RV mild to moderately  down. - IABP out. Swan pulled 05/01/17. - Coox inaccurate this am. Was 50% this am despite milrinone 0.375 mcg/kg/min.  - Back on IV lasix. CVP coming down but creatinine climbing slightly. Will need to be careful with diuresis. Decrease lasix to 60 IV daily - Continue  digoxin 0.125. Check level in am.  - Continue spiro 12.5 mg daily. - No ACE/ARB/b-blocker with shock - Echo reviewed personally with LVEF 20% moderate RV dysfunction   - We have been unable to wean inotropic support and even on fairly high-dose milrinone he remains very tenuous. I think he will die soon if there is no durable option for him. Not transplant candidate due to limited support. VAD team now evaluating. RV tenuous but may be sufficient. Possibly post for Thursday. If accepted will need IABP back in next week. D/w Dr. Donata Clay personally this am.  2. Atrial flutter: New onset. S/p DCCV to NSR on 4/12.  - Remains in NSR s/p DCCV.  - Continue amiodarone and heparin gtts for now pending possible IABP/VAD later this week  - INR 1.45. Off warfarin. On heparin.  - Previously off Xarelto due to [facility].  3. AKI CKD Stage  II-III: Likely ATN due to shock - Improved today with increased milrinone dosing.  4. H/o LV Thrombus - now recurrent on echo 04/23/17: Off Xarelto due to previous SAH.  - Continue heparin. Stop warfarin  5. DMII - Cover with SSI plus lantus.  - No change to current plan.   6. Hypothyroidism: TFTs OK 04/22/17.  - No change to current plan.   7. RLE erythema/cellulitis - started doxy 4/20  [name], MD  05/06/2017 8:27 AM   Advanced Heart Failure Team Pager [phone] (M-F; 7a - 4p)  Please contact [facility] for night-coverage after hours (4p -7a ) and weekends on [email or web address]

## 2017-05-06 NOTE — Consult Note (Signed)
Consultation Note Date: 05/06/2017   Patient Name: Benjamin Sherman  DOB: Sep 01, 1970  MRN: [id number]  Age / Sex: 47 y.o., male  PCP: Patient, No Pcp Per Referring Physician: Jolaine Artist, MD  Reason for Consultation: LVAD evaluation  HPI/Patient Profile: 47 y.o. male  with past medical history of systolic CHF (EF 76-54%), LV thrombus, DM2, hypothyroidism, h/o traumatic SAH, CKD stage 2-3 admitted on 04/21/2017 with progressing SOB and bilateral extremity swelling. Currently requiring high dose milrinone, amiodarone, and heparin infusion. Being evaluated for LVAD potentially for implant later this week.   Clinical Assessment and Goals of Care: I met today with [name]. [name] shares that he has reviewed the LVAD packet and information and seems fairly educated on LVAD process and expectations. 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 He says he was an Arts administrator brat" growing up and moved a lot but his father was from [place] so they settled in [place] closer to his father's home after he was out of the [employer]. [name] says that he has no real close family but does have close friends that are good support. His goal with LVAD would be to have better day-to-day QOL and not be limited by his SOB (he was not even able to sleep prior to admission). He is unsure if he would like to return to the workforce s/p LVAD.   We reviewed LVAD work up, surgery and expectations as well as complications, and recovery process. Discussed role of caregivers and expectations of caregivers upon discharge from the hospital. He shares that he does have some friends that he believes will be able to fill these caregiver roles. Discussed common QOL symptoms during recovery process such as insomnia, pain, and depression. He denies feeling any depression but is overwhelmed with the thought of LVAD. Was sleeping  poorly PTA but "I slept like a baby last night." He has resigned himself to recognizing that this is what he needs and is hopeful this will give him quantity and quality of life.   We also discussed the importance of HCPOA/Living Will. I provided Advance Directive Packet for him. He understands the importance of having a surrogate decision maker and encouraged him to consider in what circumstances or scenarios he would not desire life prolonging measures (dialysis, feeding, tubes, breathing machines, etc). He is very clear that quality of life is important to him so I challenged him to consider what he considers to be good quality of life. He will look over packet and says he also has a Chief Executive Officer in [place] that could help him with this as well.   [name] seems a little overwhelmed but well adjusted to idea of LVAD considered this has just been introduced to him a couple days ago. Will follow up with him tomorrow.    Primary Decision Maker PATIENT    SUMMARY OF RECOMMENDATIONS   - Appears to be good LVAD candidate from palliative perspective - Will need CSW to meet caregivers and complete full LVAD  evaluation  Code Status/Advance Care Planning:  Full code   Symptom Management:   Per heart failure team  Palliative Prophylaxis:   Bowel Regimen, Delirium Protocol and Frequent Pain Assessment  Additional Recommendations (Limitations, Scope, Preferences):  Full Scope Treatment  Psycho-social/Spiritual:   Desire for further Chaplaincy support:yes  Additional Recommendations: Caregiving  Support/Resources and Medicaid/Financial Assistance  Prognosis:   Unable to determine  Discharge Planning: To Be Determined      Primary Diagnoses: Present on Admission: . Fluid overload . Diabetes mellitus type 2, uncontrolled, with complications ([place]) . CKD (chronic kidney disease), stage III ([place]) . Chronic systolic CHF (congestive heart failure) ([place]) . LV (left ventricular) mural thrombus  without MI   I have reviewed the medical record, interviewed the patient and family, and examined the patient. The following aspects are pertinent.  Past Medical History:  Diagnosis Date  . CHF (congestive heart failure) ([place])   . Diabetes mellitus without complication [facility])    Social History   Socioeconomic History  . Marital status: Single    Spouse name: Not on file  . Number of children: Not on file  . Years of education: Not on file  . Highest education level: Not on file  Occupational History  . Occupation: Air cabin crew Needs  . Financial resource strain: Not on file  . Food insecurity:    Worry: Not on file    Inability: Not on file  . Transportation needs:    Medical: Not on file    Non-medical: Not on file  Tobacco Use  . Smoking status: Former Smoker    Types: Cigars    Last attempt to quit: 11/16/2016    Years since quitting: 0.4  . Smokeless tobacco: Never Used  . Tobacco comment: 6 cigars per wk  Substance and Sexual Activity  . Alcohol use: Yes    Alcohol/week: 8.4 oz    Types: 12 Cans of beer, 2 Shots of liquor per week    Comment: occ  . Drug use: No  . Sexual activity: Not on file  Lifestyle  . Physical activity:    Days per week: Not on file    Minutes per session: Not on file  . Stress: Not on file  Relationships  . Social connections:    Talks on phone: Not on file    Gets together: Not on file    Attends religious service: Not on file    Active member of club or organization: Not on file    Attends meetings of clubs or organizations: Not on file    Relationship status: Not on file  Other Topics Concern  . Not on file  Social History Narrative  . Not on file   Family History  Problem Relation Age of Onset  . CAD Father    Scheduled Meds: . Chlorhexidine Gluconate Cloth  6 each Topical Daily  . digoxin  0.125 mg Oral Daily  . doxycycline  100 mg Oral Q12H  . feeding supplement (GLUCERNA SHAKE)  237 mL Oral Q24H  . feeding  supplement (PRO-STAT SUGAR FREE 64)  30 mL Oral BID  . [START ON 05/07/2017] furosemide  60 mg Intravenous Daily  . insulin aspart  0-15 Units Subcutaneous TID WC  . insulin aspart  0-5 Units Subcutaneous QHS  . insulin glargine  10 Units Subcutaneous QHS  . sodium chloride flush  10-40 mL Intracatheter Q12H  . sodium chloride flush  3 mL Intravenous Q12H  . spironolactone  12.5 mg  Oral Daily   Continuous Infusions: . sodium chloride Stopped (05/01/17 1001)  . sodium chloride Stopped (05/01/17 0800)  . sodium chloride    . amiodarone 30 mg/hr (05/06/17 0100)  . heparin 1,450 Units/hr (05/06/17 0854)  . milrinone 0.375 mcg/kg/min (05/06/17 0100)   PRN Meds:.magnesium hydroxide, ondansetron (ZOFRAN) IV, sodium chloride flush, sodium chloride flush Allergies  Allergen Reactions  . Bee Venom    Review of Systems  Constitutional: Positive for activity change, fatigue and unexpected weight change. Negative for appetite change.  Respiratory: Negative for shortness of breath.   Neurological: Positive for weakness.    Physical Exam  Constitutional: He is oriented to person, place, and time. Vital signs are normal. He appears well-developed and well-nourished.  HENT:  Head: Normocephalic and atraumatic.  Cardiovascular: Normal rate.  Pulmonary/Chest: Effort normal. No accessory muscle usage. No tachypnea. No respiratory distress.  Abdominal: Soft. Normal appearance.  Neurological: He is alert and oriented to person, place, and time.  Nursing note and vitals reviewed.   Vital Signs: BP (!) 89/70 (BP Location: Left Arm)   Pulse 90   Temp 98.1 F (36.7 C) (Oral)   Resp 20   Ht 6' (1.829 m)   Wt 92.2 kg (203 lb 3.2 oz)   SpO2 100%   BMI 27.56 kg/m  Pain Scale: 0-10 POSS *See Group Information*: 2-Acceptable,Slightly drowsy, easily aroused Pain Score: 0-No pain   SpO2: SpO2: 100 % O2 Device:SpO2: 100 % O2 Flow Rate: .O2 Flow Rate (L/min): 1 L/min  IO: Intake/output summary:     Intake/Output Summary (Last 24 hours) at 05/06/2017 1021 Last data filed at 05/06/2017 0900 Gross per 24 hour  Intake 1837.36 ml  Output 2950 ml  Net -1112.64 ml    LBM: Last BM Date: 05/05/17 Baseline Weight: Weight: 103.4 kg (228 lb) Most recent weight: Weight: 92.2 kg (203 lb 3.2 oz)     Palliative Assessment/Data: 70%     Time In: 0915 Time Out: 1025 Time Total: 70 min Greater than 50%  of this time was spent counseling and coordinating care related to the above assessment and plan.  Signed by: Vinie Sill, NP Palliative Medicine Team Pager # [phone] (M-F 8a-5p) Team Phone # [phone] (Nights/Weekends)

## 2017-05-06 NOTE — Progress Notes (Signed)
ANTICOAGULATION CONSULT NOTE  Pharmacy Consult for Heparin, Warfarin Indication: atrial fibrillation/LV thrombus   Allergies  Allergen Reactions  . Bee Venom    Patient Measurements: Height: 6' (182.9 cm) Weight: 203 lb 3.2 oz (92.2 kg) IBW/kg (Calculated) : 77.6  Heparin dosing weight: 99 kg   Vital Signs: Temp: 98.1 F (36.7 C) (04/21 0458) Temp Source: Oral (04/21 0458) BP: 92/59 (04/21 0458) Pulse Rate: 89 (04/21 0458)  Labs: Recent Labs    05/04/17 0400 05/05/17 0434 05/06/17 0454  HGB 12.6* 12.0* 11.9*  HCT 39.2 36.9* 36.5*  PLT 193 194 229  LABPROT 14.7 15.9* 17.5*  INR 1.16 1.29 1.45  HEPARINUNFRC 0.50 0.35 0.31  CREATININE 1.92* 1.77* 1.85*   Assessment: 66 yoM with h/o HF admitted with volume overload and in cardiogenic shock. Started on heparin gtt for afib with RVR. No AC PTA - previously on rivaroxaban but fell, hit his head, and developed [facility] 11/18. Last head CT showed no increase hemorrhage. ECHO 4/8 showed LVEF 20-25% and large LV thrombus. Now s/p IABP removal.  Note interaction with amiodarone - can enhance the anticoagulant effect of warfarin. Patient lives in [place] - will need to establish plan for INR follow up.   Heparin level lower end of therapeutic at 0.31, INR subtherapeutic at 1.45 with warfarin being held for possible IABP/VAD this week.  H/H stable and plts wnl, no bleeding noted.  Goal of Therapy:  Heparin level 0.3-0.7 units/ml INR goal: 2-3 Monitor platelets by anticoagulation protocol: Yes   Plan:  Increase heparin gtt to 1450 units/hr Hold warfarin and f/u VAD plans  Daily INR, CBC, heparin level, s/s bleeding  Daylene Posey, PharmD Pharmacy Resident Pager #: [phone] 05/06/2017 7:40 AM

## 2017-05-06 NOTE — Progress Notes (Signed)
Pre-op Cardiac Surgery  Carotid Findings:  Right carotid not visualized secondary to lines/bandages.  Left 1-39%left  ICA plaquing.  Bilateral vertebral artery flow is antegrade.   Upper Extremity Right Left  Brachial Pressures T 115T      Lower  Extremity Right Left  Dorsalis Pedis 123T 138T  Anterior Tibial    Posterior Tibial 110T 122T  Ankle/Brachial Indices 1.07 1.20    Findings:  ABIs within normal limits.

## 2017-05-07 ENCOUNTER — Inpatient Hospital Stay (HOSPITAL_COMMUNITY): Payer: Medicaid Other

## 2017-05-07 DIAGNOSIS — I509 Heart failure, unspecified: Secondary | ICD-10-CM

## 2017-05-07 LAB — CBC
HCT: 45.6 % (ref 39.0–52.0)
Hemoglobin: 15.3 g/dL (ref 13.0–17.0)
MCH: 28.5 pg (ref 26.0–34.0)
MCHC: 33.6 g/dL (ref 30.0–36.0)
MCV: 85.1 fL (ref 78.0–100.0)
Platelets: 177 10*3/uL (ref 150–400)
RBC: 5.36 MIL/uL (ref 4.22–5.81)
RDW: 16.1 % — ABNORMAL HIGH (ref 11.5–15.5)
WBC: 3.9 10*3/uL — ABNORMAL LOW (ref 4.0–10.5)

## 2017-05-07 LAB — PULMONARY FUNCTION TEST
DL/VA % pred: 81 %
DL/VA: 3.89 ml/min/mmHg/L
DLCO cor % pred: 58 %
DLCO cor: 20.34 ml/min/mmHg
DLCO unc % pred: 59 %
DLCO unc: 20.73 ml/min/mmHg
FEF 25-75 Post: 4.28 L/sec
FEF 25-75 Pre: 3.22 L/sec
FEF2575-%Change-Post: 32 %
FEF2575-%Pred-Post: 119 %
FEF2575-%Pred-Pre: 90 %
FEV1-%Change-Post: 4 %
FEV1-%Pred-Post: 85 %
FEV1-%Pred-Pre: 81 %
FEV1-Post: 3.09 L
FEV1-Pre: 2.95 L
FEV1FVC-%Change-Post: 5 %
FEV1FVC-%Pred-Pre: 101 %
FEV6-%Change-Post: -2 %
FEV6-%Pred-Post: 80 %
FEV6-%Pred-Pre: 82 %
FEV6-Post: 3.54 L
FEV6-Pre: 3.62 L
FEV6FVC-%Pred-Post: 102 %
FEV6FVC-%Pred-Pre: 102 %
FVC-%Change-Post: -1 %
FVC-%Pred-Post: 79 %
FVC-%Pred-Pre: 80 %
FVC-Post: 3.58 L
FVC-Pre: 3.62 L
Post FEV1/FVC ratio: 86 %
Post FEV6/FVC ratio: 100 %
Pre FEV1/FVC ratio: 81 %
Pre FEV6/FVC Ratio: 100 %
RV % pred: 86 %
RV: 1.83 L
TLC % pred: 73 %
TLC: 5.37 L

## 2017-05-07 LAB — COOXEMETRY PANEL
Carboxyhemoglobin: 0.8 % (ref 0.5–1.5)
Carboxyhemoglobin: 1.1 % (ref 0.5–1.5)
Methemoglobin: 1.2 % (ref 0.0–1.5)
Methemoglobin: 1.2 % (ref 0.0–1.5)
O2 Saturation: 46.3 %
O2 Saturation: 77.2 %
Total hemoglobin: 12.7 g/dL (ref 12.0–16.0)
Total hemoglobin: 12.5 g/dL (ref 12.0–16.0)

## 2017-05-07 LAB — PROTIME-INR
INR: 1.46
Prothrombin Time: 17.6 seconds — ABNORMAL HIGH (ref 11.4–15.2)

## 2017-05-07 LAB — BASIC METABOLIC PANEL
Anion gap: 9 (ref 5–15)
BUN: 29 mg/dL — ABNORMAL HIGH (ref 6–20)
CO2: 24 mmol/L (ref 22–32)
Calcium: 8.8 mg/dL — ABNORMAL LOW (ref 8.9–10.3)
Chloride: 99 mmol/L — ABNORMAL LOW (ref 101–111)
Creatinine, Ser: 1.62 mg/dL — ABNORMAL HIGH (ref 0.61–1.24)
GFR calc Af Amer: 56 mL/min — ABNORMAL LOW (ref >60)
GFR calc non Af Amer: 49 mL/min — ABNORMAL LOW (ref >60)
Glucose, Bld: 112 mg/dL — ABNORMAL HIGH (ref 65–99)
Potassium: 3.8 mmol/L (ref 3.5–5.1)
Sodium: 132 mmol/L — ABNORMAL LOW (ref 135–145)

## 2017-05-07 LAB — HEPARIN LEVEL (UNFRACTIONATED)
Heparin Unfractionated: 0.22 IU/mL — ABNORMAL LOW (ref 0.30–0.70)
Heparin Unfractionated: 0.51 IU/mL (ref 0.30–0.70)

## 2017-05-07 LAB — HEPATITIS B CORE ANTIBODY, IGM: Hep B C IgM: NEGATIVE

## 2017-05-07 LAB — GLUCOSE, CAPILLARY
Glucose-Capillary: 103 mg/dL — ABNORMAL HIGH (ref 65–99)
Glucose-Capillary: 134 mg/dL — ABNORMAL HIGH (ref 65–99)
Glucose-Capillary: 148 mg/dL — ABNORMAL HIGH (ref 65–99)
Glucose-Capillary: 130 mg/dL — ABNORMAL HIGH (ref 65–99)

## 2017-05-07 LAB — LUPUS ANTICOAGULANT PANEL
DRVVT: 57.5 s — ABNORMAL HIGH (ref 0.0–47.0)
PTT Lupus Anticoagulant: 47 s (ref 0.0–51.9)

## 2017-05-07 LAB — MAGNESIUM: Magnesium: 1.8 mg/dL (ref 1.7–2.4)

## 2017-05-07 LAB — DIGOXIN LEVEL: Digoxin Level: 0.4 ng/mL — ABNORMAL LOW (ref 0.8–2.0)

## 2017-05-07 LAB — HEPATITIS B SURFACE ANTIBODY, QUANTITATIVE: Hepatitis B-Post: 23.9 m[IU]/mL (ref >9.9)

## 2017-05-07 LAB — DRVVT MIX: dRVVT Mix: 44.3 s (ref 0.0–47.0)

## 2017-05-07 LAB — HEPATITIS C ANTIBODY: HCV Ab: <0.1 s/co ratio (ref 0.0–0.9)

## 2017-05-07 LAB — HEPATITIS B SURFACE ANTIGEN: Hepatitis B Surface Ag: NEGATIVE

## 2017-05-07 MED ORDER — ALBUTEROL SULFATE (2.5 MG/3ML) 0.083% IN NEBU
2.5000 mg | INHALATION_SOLUTION | Freq: Once | RESPIRATORY_TRACT | Status: AC
Start: 2017-05-07 — End: 2017-05-07
  Administered 2017-05-07: 2.5 mg via RESPIRATORY_TRACT

## 2017-05-07 MED ORDER — POTASSIUM CHLORIDE CRYS ER 20 MEQ PO TBCR
30.0000 meq | EXTENDED_RELEASE_TABLET | Freq: Once | ORAL | Status: AC
  Administered 2017-05-07: 30 meq via ORAL
  Filled 2017-05-07: qty 1

## 2017-05-07 MED ORDER — MAGNESIUM OXIDE 400 (241.3 MG) MG PO TABS
800.0000 mg | ORAL_TABLET | Freq: Once | ORAL | Status: AC
  Administered 2017-05-07: 800 mg via ORAL
  Filled 2017-05-07: qty 2

## 2017-05-07 MED ORDER — FUROSEMIDE 80 MG PO TABS
80.0000 mg | ORAL_TABLET | Freq: Every day | ORAL | Status: DC
  Administered 2017-05-08 – 2017-05-13 (×6): 80 mg via ORAL
  Filled 2017-05-07 (×6): qty 1

## 2017-05-07 NOTE — Progress Notes (Signed)
CARDIAC REHAB PHASE I   PRE:  Rate/Rhythm: 91 SR    BP: in BR    SaO2:   MODE:  Ambulation: 940 ft   POST:  Rate/Rhythm: 103 ST    BP: sitting 109/82     SaO2:   Pt preparing to go to bathroom but willing to do 6 min walk test. Pt walked 700 ft in 6 min. No c/o but is limping due to swollen and red right ankle.  Talked with pt somewhat about his family support and encouraged him to talk candidly with LSW tomorrow. Encouraged continued walking.  [id number]  Harriet Masson CES, ACSM 05/07/2017 3:20 PM

## 2017-05-07 NOTE — Progress Notes (Signed)
  Repeat coox 77%.   No change to current plan.  OK to go down for full PFTs.   Casimiro Needle [address]" [place], PA-C 05/07/2017 11:51 AM

## 2017-05-07 NOTE — Progress Notes (Signed)
ANTICOAGULATION CONSULT NOTE  Pharmacy Consult for Heparin Indication: atrial fibrillation/LV thrombus   Allergies  Allergen Reactions  . Bee Venom    Patient Measurements: Height: 6' (182.9 cm) Weight: 203 lb (92.1 kg) IBW/kg (Calculated) : 77.6  Heparin dosing weight: 99 kg   Vital Signs: Temp: 98 F (36.7 C) (04/22 1614) Temp Source: Oral (04/22 1614) BP: 103/70 (04/22 1614) Pulse Rate: 94 (04/22 1614)  Labs: Recent Labs    05/05/17 0434 05/06/17 0454 05/07/17 0531 05/07/17 1539  HGB 12.0* 11.9* 15.3  --   HCT 36.9* 36.5* 45.6  --   PLT 194 229 177  --   LABPROT 15.9* 17.5* 17.6*  --   INR 1.29 1.45 1.46  --   HEPARINUNFRC 0.35 0.31 0.22* 0.51  CREATININE 1.77* 1.85* 1.62*  --    Assessment: 2 yoM with h/o HF admitted with volume overload and in cardiogenic shock. Started on heparin gtt for afib with RVR. No AC PTA - previously on rivaroxaban but fell, hit his head, and developed [facility] 11/18. Last head CT showed no increase hemorrhage. ECHO 4/8 showed LVEF 20-25% and large LV thrombus. Now s/p IABP removal.  Heparin level subtherapeutic this am  H/H stable and plts wnl, no bleeding noted. Heparin drip increased 1550 uts/hr HL 0.5 at goal.  Cbc stable.   Goal of Therapy:  Heparin level 0.3-0.7 units/ml INR goal: 2-3 Monitor platelets by anticoagulation protocol: Yes   Plan:  Continue  heparin gtt to 1550 units/hr Daily HL, CBC  Leota Sauers Pharm.D. CPP, BCPS Clinical Pharmacist [phone] 05/07/2017 6:21 PM

## 2017-05-07 NOTE — Progress Notes (Signed)
  Discussed with Dr. Donata Clay and Dr. Gala Romney.  With failure to wean milrinone, will move to 2H and plan IABP later this week.   Casimiro Needle [address]" [place], PA-C 05/07/2017 1:20 PM

## 2017-05-07 NOTE — Progress Notes (Signed)
ANTICOAGULATION CONSULT NOTE  Pharmacy Consult for Heparin Indication: atrial fibrillation/LV thrombus   Allergies  Allergen Reactions  . Bee Venom    Patient Measurements: Height: 6' (182.9 cm) Weight: 203 lb (92.1 kg) IBW/kg (Calculated) : 77.6  Heparin dosing weight: 99 kg   Vital Signs: Temp: 97.9 F (36.6 C) (04/22 0521) Temp Source: Oral (04/22 0521) BP: 84/55 (04/22 0521) Pulse Rate: 94 (04/22 0521)  Labs: Recent Labs    05/05/17 0434 05/06/17 0454 05/07/17 0531  HGB 12.0* 11.9*  --   HCT 36.9* 36.5*  --   PLT 194 229  --   LABPROT 15.9* 17.5* 17.6*  INR 1.29 1.45 1.46  HEPARINUNFRC 0.35 0.31 0.22*  CREATININE 1.77* 1.85*  --    Assessment: 36 yoM with h/o HF admitted with volume overload and in cardiogenic shock. Started on heparin gtt for afib with RVR. No AC PTA - previously on rivaroxaban but fell, hit his head, and developed [facility] 11/18. Last head CT showed no increase hemorrhage. ECHO 4/8 showed LVEF 20-25% and large LV thrombus. Now s/p IABP removal.  Heparin level subtherapeutic this am  H/H stable and plts wnl, no bleeding noted.  Goal of Therapy:  Heparin level 0.3-0.7 units/ml INR goal: 2-3 Monitor platelets by anticoagulation protocol: Yes   Plan:  Increase heparin gtt to 1550 units/hr Check heparin level later today  Thanks for allowing pharmacy to be a part of this patient's care.  Talbert Cage, PharmD Clinical Pharmacist  05/07/2017 7:03 AM

## 2017-05-07 NOTE — Consult Note (Signed)
[address]       [place] [address]             [phone]        Benjamin Sherman [facility] Medical Record #[id number] Date of Birth: [birth year]  Referring: Dr. Gala Romney  primary Care: Patient, No Pcp Per Primary Cardiologist:No primary care provider on file.  Chief Complaint:    Chief Complaint  Patient presents with  . Leg Swelling  . Shortness of Breath  Patient examined, echocardiogram and right heart cath data and coronary angiogram images and CT scan of chest images all personally reviewed and counseled with patient  History of Present Illness:     47 year old diabetic single male recently admitted for the second time for heart failure in the past 6 months with history of nonischemic cardiomyopathy, ejection fraction 20% with moderate MR, moderate TR and a 2.3 cm LV apical thrombus.  On the current admission the patient required balloon pump and inotropes for cardiogenic shock.  The patient initially was in atrial flutter and was cardioverted out of back to sinus rhythm. He was weaned off balloon pump and transferred to a stepdown bed currently on milrinone with mixed venous saturation 55%.  CT scan shows no at risk pulmonary nodules or adenopathy.  PFTs are adequate for sternotomy.  Patient's creatinine is 1.6, LFTs with mild elevation of bilirubin and uric acid 11.5.  Patient has swollen red tender right ankle may be related to gout. Request made for surgical evaluation for possible implantable LVAD therapy.  Patient denies previous surgical procedures or general anesthesia or blood transfusions.  No history of thoracic trauma.  Patient smokes cigars quit 6 months ago.  No history of chronic lung disease.  Patient denies history of GI bleeding.  He was on Xarelto for his LV thrombus but that was stopped after he had a subarachnoid hemorrhage following syncope 5 months ago from which he recovered.  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  Does not have a residence but lives with friend.   Zubrod Score: At the time of surgery this patient's most appropriate activity status/level should be described as: []     0    Normal activity, no symptoms []     1    Restricted in physical strenuous activity but ambulatory, able to do out light work []     2    Ambulatory and capable of self care, unable to do work activities, up and about                 more than 50%  Of the time                            [x]     3    Only limited self care, in bed greater than 50% of waking hours []     4    Completely disabled, no self care, confined to bed or chair []     5    Moribund  Past Medical History:  Diagnosis Date  . CHF (congestive heart failure) (HCC)   . Diabetes mellitus without complication [facility])     Past Surgical History:  Procedure Laterality Date  . CARDIOVERSION N/A 04/27/2017   Procedure: CARDIOVERSION;  Surgeon: Dolores Patty, MD;  Location: [facility] OR;  Service: Cardiovascular;  Laterality: N/A;  . IABP INSERTION N/A  04/25/2017   Procedure: IABP INSERTION;  Surgeon: Dolores Patty, MD;  Location: [facility] INVASIVE CV LAB;  Service: Cardiovascular;  Laterality: N/A;  . RIGHT HEART CATH N/A 04/25/2017   Procedure: RIGHT HEART CATH;  Surgeon: Dolores Patty, MD;  Location: [facility] INVASIVE CV LAB;  Service: Cardiovascular;  Laterality: N/A;  . RIGHT/LEFT HEART CATH AND CORONARY ANGIOGRAPHY N/A 11/10/2016   Procedure: RIGHT/LEFT HEART CATH AND CORONARY ANGIOGRAPHY;  Surgeon: Dolores Patty, MD;  Location: [facility] INVASIVE CV LAB;  Service: Cardiovascular;  Laterality: N/A;    Social History   Tobacco Use  Smoking Status Former Smoker  . Types: Cigars  . Last attempt to quit: 11/16/2016  . Years since quitting: 0.4  Smokeless Tobacco Never Used  Tobacco Comment   6 cigars per wk    Social History   Substance and Sexual Activity    Alcohol Use Yes  . Alcohol/week: 8.4 oz  . Types: 12 Cans of beer, 2 Shots of liquor per week   Comment: occ    Social History   Socioeconomic History  . Marital status: Single    Spouse name: Not on file  . Number of children: Not on file  . Years of education: Not on file  . Highest education level: Not on file  Occupational History  . Occupation: Diplomatic Services operational officer Needs  . Financial resource strain: Not on file  . Food insecurity:    Worry: Not on file    Inability: Not on file  . Transportation needs:    Medical: Not on file    Non-medical: Not on file  Tobacco Use  . Smoking status: Former Smoker    Types: Cigars    Last attempt to quit: 11/16/2016    Years since quitting: 0.4  . Smokeless tobacco: Never Used  . Tobacco comment: 6 cigars per wk  Substance and Sexual Activity  . Alcohol use: Yes    Alcohol/week: 8.4 oz    Types: 12 Cans of beer, 2 Shots of liquor per week    Comment: occ  . Drug use: No  . Sexual activity: Not on file  Lifestyle  . Physical activity:    Days per week: Not on file    Minutes per session: Not on file  . Stress: Not on file  Relationships  . Social connections:    Talks on phone: Not on file    Gets together: Not on file    Attends religious service: Not on file    Active member of club or organization: Not on file    Attends meetings of clubs or organizations: Not on file    Relationship status: Not on file  . Intimate partner violence:    Fear of current or ex partner: Not on file    Emotionally abused: Not on file    Physically abused: Not on file    Forced sexual activity: Not on file  Other Topics Concern  . Not on file  Social History Narrative  . Not on file    Allergies  Allergen Reactions  . Bee Venom     Current Facility-Administered Medications  Medication Dose Route Frequency Provider Last Rate Last Dose  . 0.9 %  sodium chloride infusion   Intravenous Continuous Clegg, Amy D, NP   Stopped at 05/01/17  1001  . 0.9 %  sodium chloride infusion  250 mL Intravenous Continuous Clegg, Amy D, NP   Stopped at 05/01/17 0800  . 0.9 %  sodium  chloride infusion   Intravenous Once Clegg, Amy D, NP      . amiodarone (NEXTERONE PREMIX) 360-4.14 MG/200ML-% (1.8 mg/mL) IV infusion  30 mg/hr Intravenous Continuous Clegg, Amy D, NP 16.7 mL/hr at 05/07/17 1529 30 mg/hr at 05/07/17 1529  . Chlorhexidine Gluconate Cloth 2 % PADS 6 each  6 each Topical Daily Clegg, Amy D, NP   6 each at 05/07/17 1000  . digoxin (LANOXIN) tablet 0.125 mg  0.125 mg Oral Daily Clegg, Amy D, NP   0.125 mg at 05/07/17 0947  . doxycycline (VIBRA-TABS) tablet 100 mg  100 mg Oral Q12H Bensimhon, Bevelyn Buckles, MD   100 mg at 05/07/17 0947  . feeding supplement (GLUCERNA SHAKE) (GLUCERNA SHAKE) liquid 237 mL  237 mL Oral Q24H Bensimhon, Bevelyn Buckles, MD   237 mL at 05/07/17 1744  . feeding supplement (PRO-STAT SUGAR FREE 64) liquid 30 mL  30 mL Oral BID Bensimhon, Bevelyn Buckles, MD   30 mL at 05/07/17 0948  . [START ON 05/08/2017] furosemide (LASIX) tablet 80 mg  80 mg Oral Daily Graciella Freer, PA-C      . heparin ADULT infusion 100 units/mL (25000 units/257mL sodium chloride 0.45%)  1,550 Units/hr Intravenous Continuous Bensimhon, Bevelyn Buckles, MD 15.5 mL/hr at 05/07/17 0702 1,550 Units/hr at 05/07/17 0702  . insulin aspart (novoLOG) injection 0-15 Units  0-15 Units Subcutaneous TID WC Clegg, Amy D, NP   2 Units at 05/07/17 1743  . insulin aspart (novoLOG) injection 0-5 Units  0-5 Units Subcutaneous QHS Clegg, Amy D, NP   2 Units at 04/25/17 2212  . insulin glargine (LANTUS) injection 10 Units  10 Units Subcutaneous QHS Clegg, Amy D, NP   10 Units at 05/06/17 2300  . magnesium hydroxide (MILK OF MAGNESIA) suspension 30 mL  30 mL Oral Daily PRN Clegg, Amy D, NP   30 mL at 05/01/17 0907  . milrinone (PRIMACOR) 20 MG/100 ML (0.2 mg/mL) infusion  0.375 mcg/kg/min Intravenous Continuous Graciella Freer, PA-C 10.6 mL/hr at 05/07/17 1529 0.375 mcg/kg/min  at 05/07/17 1529  . ondansetron (ZOFRAN) injection 4 mg  4 mg Intravenous Q4H PRN Clegg, Amy D, NP   4 mg at 04/27/17 0048  . sodium chloride flush (NS) 0.9 % injection 10-40 mL  10-40 mL Intracatheter Q12H Clegg, Amy D, NP   10 mL at 05/05/17 2207  . sodium chloride flush (NS) 0.9 % injection 10-40 mL  10-40 mL Intracatheter PRN Clegg, Amy D, NP      . sodium chloride flush (NS) 0.9 % injection 3 mL  3 mL Intravenous Q12H Clegg, Amy D, NP   3 mL at 05/07/17 0952  . sodium chloride flush (NS) 0.9 % injection 3 mL  3 mL Intravenous PRN Clegg, Amy D, NP   3 mL at 05/02/17 2113  . spironolactone (ALDACTONE) tablet 12.5 mg  12.5 mg Oral Daily Clegg, Amy D, NP   12.5 mg at 05/07/17 1610    Medications Prior to Admission  Medication Sig Dispense Refill Last Dose  . digoxin (LANOXIN) 0.125 MG tablet Take 1 tablet (0.125 mg total) by mouth daily. 30 tablet 2 04/21/2017 at Unknown time  . furosemide (LASIX) 40 MG tablet Take 1 tablet (40 mg total) by mouth daily. 30 tablet 2 04/21/2017 at Unknown time  . losartan (COZAAR) 25 MG tablet Take 1 tablet (25 mg total) by mouth at bedtime. 30 tablet 2 04/20/2017 at Unknown time  . spironolactone (ALDACTONE) 25 MG tablet Take 0.5 tablets (  12.5 mg total) by mouth daily. 30 tablet 2 04/21/2017 at Unknown time    Family History  Problem Relation Age of Onset  . CAD Father      Review of Systems:   ROS No fever, weight stable    Cardiac Review of Systems: Y or  [    ]= no  Chest Pain [    ]  Resting SOB [place]ienne   ] Exertional SOB  [ y ]  [place] [  ]   Pedal Edema [place]ienne   ]    Palpitations [ y ] Syncope  [  ]   Presyncope [   ]  General Review of Systems: [Y] = yes [  ]=no Constitional: recent weight change [  ]; anorexia [  ]; fatigue [  ]; nausea [  ]; night sweats [  ]; fever [  ]; or chills [  ]                                                               Dental: poor dentition[ y ]; Last Dentist visit: > 5 yrs  Eye : blurred vision [  ]; diplopia [   ]; vision  changes [  ];  Amaurosis fugax[  ]; Resp: cough [ y ];  wheezing[  ];  hemoptysis[  ]; shortness of breath[  ]; paroxysmal nocturnal dyspnea[  ]; dyspnea on exertion[  ]; or orthopnea[  ];  GI:  gallstones[  ], vomiting[  ];  dysphagia[  ]; melena[  ];  hematochezia [  ]; heartburn[  ];   Hx of  Colonoscopy[  ]; GU: kidney stones [  ]; hematuria[  ];   dysuria [  ];  nocturia[  ];  history of     obstruction [  ]; urinary frequency [  ]             Skin: rash, swelling[  ];, hair loss[  ];  peripheral edema[  ];  or itching[  ]; Musculosketetal: myalgias[  ];  joint swelling[  ];  joint erythema[y right ankle];  joint pain[  ];  back pain[  ];  Heme/Lymph: bruising[  ];  bleeding[  ];  anemia[  ];  Neuro: TIA[  ];  headaches[  ];  stroke[  ];  vertigo[  ];  seizures[  ];   paresthesias[  ];  difficulty walking[  ];  Psych:depression[  ]; anxiety[  ];  Endocrine: diabetes[ y ];  thyroid dysfunction[  ];  Immunizations: Flu [  ]; Pneumococcal[  ];    Physical Exam: BP 103/70 (BP Location: Left Arm)   Pulse 94   Temp 98 F (36.7 C) (Oral)   Resp 17   Ht 6' (1.829 m)   Wt 203 lb (92.1 kg)   SpO2 100%   BMI 27.53 kg/m         Exam    General- alert and comfortable, middle-aged AA male no acute distress    HEENT -loose necrotic teeth in the anterior mandible region    Neck- no JVD, no cervical adenopathy palpable, no carotid bruit.  IJ triple-lumen catheter in place   Lungs- clear without rales, wheezes   Cor- regular rate and rhythm, no murmur ,  + S3gallop  Abdomen- soft, non-tender   Extremities - warm, non-tender, minimal edema   Neuro- oriented, appropriate, no focal weakness   Diagnostic Studies & Laboratory data:     Recent Radiology Findings:  CT scan of chest shows mild COPD changes no neoplasm   I have independently reviewed the above radiologic studies.  Recent Lab Findings: Lab Results  Component Value Date   WBC 3.9 (L) 05/07/2017   HGB 15.3 05/07/2017    HCT 45.6 05/07/2017   PLT 177 05/07/2017   GLUCOSE 112 (H) 05/07/2017   CHOL 156 05/04/2017   TRIG 67 05/04/2017   HDL 64 05/04/2017   LDLCALC 79 05/04/2017   ALT 233 (H) 04/23/2017   AST 295 (H) 04/23/2017   NA 132 (L) 05/07/2017   K 3.8 05/07/2017   CL 99 (L) 05/07/2017   CREATININE 1.62 (H) 05/07/2017   BUN 29 (H) 05/07/2017   CO2 24 05/07/2017   TSH 2.360 04/23/2017   INR 1.46 05/07/2017   HGBA1C 7.5 (H) 04/22/2017      Assessment / Plan:   The patient medically appears to be appropriate candidate for destination therapy based LVAD therapy however he has significant social challenges  In regards to his residence, support, and income that need to be evaluated by clinical Child psychotherapist.  Patient has loose necrotic teeth in his mandible that need to be assessed by dental service for extraction before VAD implant.  Right swollen ankle could be gout and may respond to anti-inflammatory agent      @ME1 @ 05/07/2017 6:36 PM

## 2017-05-07 NOTE — Progress Notes (Signed)
Prior to drawing stat Co-ox, noted that CVP was being transduced to the medial port of the triple lumen catheter.  Switched the CVP line to the distal port and drew Co-ox.

## 2017-05-07 NOTE — Progress Notes (Signed)
Pt ambulated without difficulties

## 2017-05-07 NOTE — Progress Notes (Addendum)
Patient ID: Benjamin Sherman, male   DOB: 06/08/1970, 47 y.o.   MRN: [id number]     Advanced Heart Failure Rounding Note  Subjective:    Admitted 04/21/17 with cardiogenic shock and atrial flutter.   Central line placed. Initial co-ox 39%.  IABP placed 04/25/17-> 04/30/17 with persistent cardiogenic shock.   DCCV atrial flutter 4/12.  Maintaining NSR   Coox continues to trend down despite milrinone titration.  Coox 46% this am on milrinone 0.375 mcg/kg/min.  Repeat pending.  CVP ~4-5 cm  Feeling OK this am. Anxious about possible need for LVAD.  Denies SOB, lightheadedness or dizziness. No CP. Appetite is good and continues to have stable UOP.   Echo 4/8: LVEF 20-25% with LV apical clot. RV mild to moderately down.  Repeat echo 4/19 unchanged RV moderately down   Creatinine 2.02 => 1.73=> 1.7 => 1.85 => 1.93=> 1.92 => 1.7 => 1.8 => 1.6  Objective:   Weight Range:  Vital Signs:   Temp:  [97.9 F (36.6 C)-98.6 F (37 C)] 98 F (36.7 C) (04/22 0749) Pulse Rate:  [90-101] 96 (04/22 0749) Resp:  [17-20] 17 (04/22 0521) BP: (84-115)/(55-79) 94/70 (04/22 0749) SpO2:  [99 %-100 %] 99 % (04/22 0749) Weight:  [203 lb (92.1 kg)] 203 lb (92.1 kg) (04/22 0521) Last BM Date: 05/07/17  Weight change: Filed Weights   05/05/17 0442 05/06/17 0458 05/07/17 0521  Weight: 204 lb 14.4 oz (92.9 kg) 203 lb 3.2 oz (92.2 kg) 203 lb (92.1 kg)   Intake/Output:   Intake/Output Summary (Last 24 hours) at 05/07/2017 1042 Last data filed at 05/07/2017 4098 Gross per 24 hour  Intake 1167.51 ml  Output 800 ml  Net 367.51 ml    Physical Exam   General: Sitting in chair. No resp difficulty. HEENT: Normal anicteric  Neck: Supple. JVP 4-5. RIJ Triple lumen CVL. Carotids 2+ bilat; no bruits. No thyromegaly or nodule noted. Cor: PMI  Laterally displaced. RRR, +S3 Lungs: CTAB, normal effort. No wheeze  Abdomen: Soft, non-tender, non-distended, no HSM. No bruits or masses. +BS  Extremities: No cyanosis or  clubbing. R and LLE no edema. Mild R ankle erythema. Neuro: Alert & orientedx3, cranial nerves grossly intact. moves all 4 extremities w/o difficulty. Affect pleasant   Telemetry    NSR 90s, personally reviewed.   EKG   No new tracings.    Labs    Basic Metabolic Panel: Recent Labs  Lab 05/03/17 0450 05/04/17 0400 05/05/17 0434 05/06/17 0454 05/07/17 0531  NA 131* 133* 132* 132* 132*  K 3.8 3.8 4.0 3.9 3.8  CL 96* 96* 98* 99* 99*  CO2 25 26 24 24 24   GLUCOSE 126* 162* 151* 123* 112*  BUN 31* 33* 28* 28* 29*  CREATININE 1.93* 1.92* 1.77* 1.85* 1.62*  CALCIUM 8.7* 9.0 8.8* 8.8* 8.8*  MG 2.1 2.0 1.9 1.8 1.8   Liver Function Tests: No results for input(s): AST, ALT, ALKPHOS, BILITOT, PROT, ALBUMIN in the last 168 hours. No results for input(s): LIPASE, AMYLASE in the last 168 hours. No results for input(s): AMMONIA in the last 168 hours.  CBC: Recent Labs  Lab 05/03/17 1232 05/04/17 0400 05/05/17 0434 05/06/17 0454 05/07/17 0531  WBC 6.7 6.3 5.9 6.5 3.9*  HGB 13.7 12.6* 12.0* 11.9* 15.3  HCT 41.2 39.2 36.9* 36.5* 45.6  MCV 85.7 85.8 85.6 85.5 85.1  PLT 171 193 194 229 177   Cardiac Enzymes: No results for input(s): CKTOTAL, CKMB, CKMBINDEX, TROPONINI in the last 168 hours.  BNP: BNP (last 3 results) Recent Labs    11/06/16 1621 03/25/17 1401 04/21/17 1212  BNP 961.6* 835.4* 483.0*   ProBNP (last 3 results) No results for input(s): PROBNP in the last 8760 hours.  Other results:  Imaging: No results found.  Medications:    Scheduled Medications: . Chlorhexidine Gluconate Cloth  6 each Topical Daily  . digoxin  0.125 mg Oral Daily  . doxycycline  100 mg Oral Q12H  . feeding supplement (GLUCERNA SHAKE)  237 mL Oral Q24H  . feeding supplement (PRO-STAT SUGAR FREE 64)  30 mL Oral BID  . furosemide  60 mg Intravenous Daily  . insulin aspart  0-15 Units Subcutaneous TID WC  . insulin aspart  0-5 Units Subcutaneous QHS  . insulin glargine  10 Units  Subcutaneous QHS  . sodium chloride flush  10-40 mL Intracatheter Q12H  . sodium chloride flush  3 mL Intravenous Q12H  . spironolactone  12.5 mg Oral Daily    Infusions: . sodium chloride Stopped (05/01/17 1001)  . sodium chloride Stopped (05/01/17 0800)  . sodium chloride    . amiodarone 30 mg/hr (05/07/17 0527)  . heparin 1,550 Units/hr (05/07/17 0702)  . milrinone 0.375 mcg/kg/min (05/07/17 0526)    PRN Medications:    Assessment:   Benjamin Sherman is a 47 y.o. 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 eosinophilic cardiomyopathy, LV thrombus, hypothyroidism, DM2, CKD Stage II-III and traumatic [facility] 11/18 after fall.   Admitted with cardiogenic shock.  Plan/Discussion:    1. Acute on chronic systolic HF -> cardiogenic shock: ECHO 11/2016 EF 25-30% CMRI EF 22%. Bedside echo in HF clinic EF 20% in 3/19.  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 Did not respond to steroids. Admitted 4/6 with cardiogenic shock and volume overload co-ox 38%. Echo 04/23/17: LVEF 20-25% with large LV clot, RV mild to moderately down. - IABP out. Swan pulled 05/01/17. - Coox low this am at 46.3% despite milrinone 0.375 mcg/kg/min.  - Repeat coox. If remains low, will move back to unit for levophed and possible IABP to VAD.  - Back on IV lasix over weekend with elevated CVP. Now much improved. Received dose this am, possibly to po this evening depending on repeat coox and +/- need for additional pressors.  - Continue  digoxin 0.125. Level 0.4 this am.  - Continue spiro 12.5 mg daily. - No ACE/ARB/b-blocker with shock - Echo reviewed personally with LVEF 20% moderate RV dysfunction   - We have been unable to wean inotropic support and even on fairly high-dose milrinone he remains very tenuous. Pt will likely die soon if there is no durable option for him. Not transplant candidate due to limited support. VAD team now evaluating. RV tenuous but may be sufficient.  Could potentially get VAD Thursday. If accepted will need IABP back in early this week.  2. Atrial flutter: New onset. S/p DCCV to NSR on 4/12.  - Remains in NSR s/p DCCV.  - Continue amiodarone and heparin gtts for now pending possible IABP/VAD later this week  - INR 1.46. Off warfarin. Cont heparin with potential surgery.  - Previously off Xarelto due to [facility].  3. AKI CKD Stage II-III: Likely ATN due to shock - Improved today.  4. H/o LV Thrombus - now recurrent on echo 04/23/17: Off Xarelto due to previous SAH.  - Continue heparin. Stop warfarin with possible surgery.  5. DMII - Cover with SSI plus lantus.  - No change  to current plan.    6. Hypothyroidism: TFTs OK 04/22/17.  - No change to current plan.   7. RLE erythema/cellulitis - Started doxy 4/20. Improved.   Graciella Freer, PA-C  05/07/2017 10:42 AM   Advanced Heart Failure Team Pager [phone] (M-F; 7a - 4p)  Please contact [facility] for night-coverage after hours (4p -7a ) and weekends on [email or web address]  Patient seen and examined with the above-signed Advanced Practice Provider and/or Housestaff. I personally reviewed laboratory data, imaging studies and relevant notes. I independently examined the patient and formulated the important aspects of the plan. I have edited the note to reflect any of my changes or salient points. I have personally discussed the plan with the patient and/or family.  Remains extremely tenuous. Co-ox low on milrinone. Repeat improved. Remains in NSR on amiodarone. Cellulitis improving with doxy.   Long time about need for mechanical support but I am still not convinced he has the support system or income to support this. I discussed my concerns with VAD team and Dr. Donata Clay today personally at [facility] Will have form SW eval tomorrow to get better assessment. Will need teeth extraction as well. If approved will plan LVAD next Tuesday.   [name], MD  10:06 PM

## 2017-05-07 NOTE — Progress Notes (Signed)
Palliative:  I met again today with Mr. Cairns. No family or visitors at bedside although he does say that his cousin "who was raised more as a sister" was here last night and will be returning tomorrow. He understands that LVAD work up is underway and anticipating LVAD implant later this week. Noted he has not yet met surgeon or had CSW evaluation.   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 He says his team was like in [facility] movie and were called in to rescue people. He shares his experiences as he has travelled between his father's [place] experience and his own all over the [place] as well as living in [place] and [place]. We discussed what he enjoys for fun and he tells me that he likes to read a lot and enjoys Renetta Chalk and Cyndy Freeze. He enjoys reading news and information on his computer (which he meant to give his cousin keys to bring him his laptop from home but forgot).   Mr. Tullis says that he has close friends that are dedicated and willing to be his caregiver post LVAD but I have not yet met or seen any visitors. Recommend CSW to follow up for evaluation of caregivers and their dedication to Mr. Grewe.   22 min  Vinie Sill, NP Palliative Medicine Team Pager # [phone] (M-F 8a-5p) Team Phone # [phone] (Nights/Weekends)

## 2017-05-08 ENCOUNTER — Encounter (HOSPITAL_COMMUNITY): Payer: Self-pay | Admitting: Dentistry

## 2017-05-08 DIAGNOSIS — I5022 Chronic systolic (congestive) heart failure: Secondary | ICD-10-CM

## 2017-05-08 DIAGNOSIS — Z01818 Encounter for other preprocedural examination: Secondary | ICD-10-CM

## 2017-05-08 LAB — CBC
HCT: 37.9 % — ABNORMAL LOW (ref 39.0–52.0)
Hemoglobin: 12.3 g/dL — ABNORMAL LOW (ref 13.0–17.0)
MCH: 28 pg (ref 26.0–34.0)
MCHC: 32.5 g/dL (ref 30.0–36.0)
MCV: 86.1 fL (ref 78.0–100.0)
Platelets: 271 10*3/uL (ref 150–400)
RBC: 4.4 MIL/uL (ref 4.22–5.81)
RDW: 16.1 % — ABNORMAL HIGH (ref 11.5–15.5)
WBC: 6.1 10*3/uL (ref 4.0–10.5)

## 2017-05-08 LAB — COOXEMETRY PANEL
Carboxyhemoglobin: 1 % (ref 0.5–1.5)
Methemoglobin: 1.3 % (ref 0.0–1.5)
O2 Saturation: 55.1 %
Total hemoglobin: 12.6 g/dL (ref 12.0–16.0)

## 2017-05-08 LAB — TSH: TSH: 10.848 u[IU]/mL — ABNORMAL HIGH (ref 0.350–4.500)

## 2017-05-08 LAB — COMPREHENSIVE METABOLIC PANEL
ALT: 19 U/L (ref 17–63)
AST: 24 U/L (ref 15–41)
Albumin: 2.5 g/dL — ABNORMAL LOW (ref 3.5–5.0)
Alkaline Phosphatase: 81 U/L (ref 38–126)
Anion gap: 9 (ref 5–15)
BUN: 30 mg/dL — ABNORMAL HIGH (ref 6–20)
CO2: 23 mmol/L (ref 22–32)
Calcium: 8.7 mg/dL — ABNORMAL LOW (ref 8.9–10.3)
Chloride: 101 mmol/L (ref 101–111)
Creatinine, Ser: 1.81 mg/dL — ABNORMAL HIGH (ref 0.61–1.24)
GFR calc Af Amer: 49 mL/min — ABNORMAL LOW (ref >60)
GFR calc non Af Amer: 43 mL/min — ABNORMAL LOW (ref >60)
Glucose, Bld: 114 mg/dL — ABNORMAL HIGH (ref 65–99)
Potassium: 4.1 mmol/L (ref 3.5–5.1)
Sodium: 133 mmol/L — ABNORMAL LOW (ref 135–145)
Total Bilirubin: 0.9 mg/dL (ref 0.3–1.2)
Total Protein: 7.6 g/dL (ref 6.5–8.1)

## 2017-05-08 LAB — URINALYSIS, ROUTINE W REFLEX MICROSCOPIC
Bilirubin Urine: NEGATIVE
Glucose, UA: NEGATIVE mg/dL
Hgb urine dipstick: NEGATIVE
Ketones, ur: NEGATIVE mg/dL
Leukocytes, UA: NEGATIVE
Nitrite: NEGATIVE
Protein, ur: 100 mg/dL — AB
Specific Gravity, Urine: 1.016 (ref 1.005–1.030)
pH: 6 (ref 5.0–8.0)

## 2017-05-08 LAB — SURGICAL PCR SCREEN
MRSA, PCR: NEGATIVE
Staphylococcus aureus: POSITIVE — AB

## 2017-05-08 LAB — GLUCOSE, CAPILLARY
Glucose-Capillary: 112 mg/dL — ABNORMAL HIGH (ref 65–99)
Glucose-Capillary: 131 mg/dL — ABNORMAL HIGH (ref 65–99)
Glucose-Capillary: 99 mg/dL (ref 65–99)
Glucose-Capillary: 126 mg/dL — ABNORMAL HIGH (ref 65–99)

## 2017-05-08 LAB — PROTIME-INR
INR: 1.37
Prothrombin Time: 16.7 seconds — ABNORMAL HIGH (ref 11.4–15.2)

## 2017-05-08 LAB — HEMOGLOBIN A1C
Hgb A1c MFr Bld: 7.7 % — ABNORMAL HIGH (ref 4.8–5.6)
Mean Plasma Glucose: 174.29 mg/dL

## 2017-05-08 LAB — HEPARIN LEVEL (UNFRACTIONATED): Heparin Unfractionated: 0.55 IU/mL (ref 0.30–0.70)

## 2017-05-08 LAB — MAGNESIUM: Magnesium: 1.8 mg/dL (ref 1.7–2.4)

## 2017-05-08 LAB — T4, FREE: Free T4: 1 ng/dL (ref 0.61–1.12)

## 2017-05-08 MED ORDER — HEPARIN (PORCINE) IN NACL 100-0.45 UNIT/ML-% IJ SOLN: 1550.0000 [IU]/h | INTRAMUSCULAR | Status: DC

## 2017-05-08 MED ORDER — MUPIROCIN 2 % EX OINT
1.0000 "application " | TOPICAL_OINTMENT | Freq: Two times a day (BID) | CUTANEOUS | Status: AC
  Administered 2017-05-08 – 2017-05-13 (×10): 1 via NASAL
  Filled 2017-05-08: qty 22

## 2017-05-08 MED ORDER — HEPARIN (PORCINE) IN NACL 100-0.45 UNIT/ML-% IJ SOLN
1550.0000 [IU]/h | INTRAMUSCULAR | Status: AC
  Administered 2017-05-09 (×2): 1550 [IU]/h via INTRAVENOUS
  Filled 2017-05-08 (×2): qty 250

## 2017-05-08 MED ORDER — AMIODARONE HCL 200 MG PO TABS
200.0000 mg | ORAL_TABLET | Freq: Two times a day (BID) | ORAL | Status: DC
  Administered 2017-05-08 – 2017-05-14 (×13): 200 mg via ORAL
  Filled 2017-05-08 (×13): qty 1

## 2017-05-08 MED ORDER — CHLORHEXIDINE GLUCONATE CLOTH 2 % EX PADS
6.0000 | MEDICATED_PAD | Freq: Every day | CUTANEOUS | Status: AC
  Administered 2017-05-08 – 2017-05-10 (×2): 6 via TOPICAL

## 2017-05-08 MED ORDER — CEFAZOLIN SODIUM-DEXTROSE 2-4 GM/100ML-% IV SOLN
2.0000 g | Freq: Once | INTRAVENOUS | Status: AC
  Administered 2017-05-10: 2 g via INTRAVENOUS
  Filled 2017-05-08 (×2): qty 100

## 2017-05-08 MED ORDER — MAGNESIUM SULFATE 2 GM/50ML IV SOLN
2.0000 g | Freq: Once | INTRAVENOUS | Status: AC
  Administered 2017-05-08: 2 g via INTRAVENOUS
  Filled 2017-05-08: qty 50

## 2017-05-08 NOTE — Progress Notes (Addendum)
Patient ID: Benjamin Sherman, male   DOB: December 05, 1970, 47 y.o.   MRN: [id number]     Advanced Heart Failure Rounding Note  Subjective:    Admitted 04/21/17 with cardiogenic shock and atrial flutter.   Central line placed. Initial co-ox 39%.  IABP placed 04/25/17-> 04/30/17 with persistent cardiogenic shock.   DCCV atrial flutter 4/12.  Maintaining NSR on amio drip.  Coox intially 46% with increased milrinone, but improved to 77% yesterday. Coox 55% this am on Milrinone 0.375 mcg/kg/min. Less UOP yesterday with 60 mg IV lasix x1. Weight unchanged. CVP 10  No CP or SOB. Good appetite. Denies orthopnea or PND. Rhythm stable on milrinone. Right ankle is improving. Wants to know what time his SW meeting is today so he can tell his friends to come.   Echo 4/8: LVEF 20-25% with LV apical clot. RV mild to moderately down.  Repeat echo 4/19 unchanged RV moderately down   Creatinine 2.02 => 1.73=> 1.7 => 1.85 => 1.93=> 1.92 => 1.7 => 1.8 => 1.6 => 1.8  Objective:   Weight Range:  Vital Signs:   Temp:  [98 F (36.7 C)-98.4 F (36.9 C)] 98.2 F (36.8 C) (04/23 0726) Pulse Rate:  [90-99] 90 (04/23 0726) BP: (101-108)/(67-77) 103/67 (04/23 0726) SpO2:  [99 %-100 %] 100 % (04/23 0726) Weight:  [203 lb 4.8 oz (92.2 kg)] 203 lb 4.8 oz (92.2 kg) (04/23 0444) Last BM Date: 05/07/17  Weight change: Filed Weights   05/06/17 0458 05/07/17 0521 05/08/17 0444  Weight: 203 lb 3.2 oz (92.2 kg) 203 lb (92.1 kg) 203 lb 4.8 oz (92.2 kg)   Intake/Output:   Intake/Output Summary (Last 24 hours) at 05/08/2017 0824 Last data filed at 05/08/2017 0700 Gross per 24 hour  Intake 2005.51 ml  Output 875 ml  Net 1130.51 ml    Physical Exam   CVP 10 General:No resp difficulty. Sitting in chair.  HEENT: Normal anicteric Neck: Supple. JVP 10-12. Carotids 2+ bilat; no bruits. No thyromegaly or nodule noted. Right IJ TLC Cor: PMI laterally displaced. RRR, +S3 Lungs: CTAB, normal effort. No wheeze Abdomen: Soft,  non-tender, non-distended, no HSM. No bruits or masses. +BS  Extremities: No cyanosis, clubbing, or rash. R and LLE trace ankle edema. Right medial ankle with mild erythema (much improved). Warm.  Neuro: alert & oriented x 3, cranial nerves grossly intact. moves all 4 extremities w/o difficulty. Affect pleasant    Telemetry    SR 80-90s. Personally reviewed.   EKG   No new tracings.   Labs    Basic Metabolic Panel: Recent Labs  Lab 05/04/17 0400 05/05/17 0434 05/06/17 0454 05/07/17 0531 05/08/17 0542  NA 133* 132* 132* 132* 133*  K 3.8 4.0 3.9 3.8 4.1  CL 96* 98* 99* 99* 101  CO2 26 24 24 24 23   GLUCOSE 162* 151* 123* 112* 114*  BUN 33* 28* 28* 29* 30*  CREATININE 1.92* 1.77* 1.85* 1.62* 1.81*  CALCIUM 9.0 8.8* 8.8* 8.8* 8.7*  MG 2.0 1.9 1.8 1.8 1.8   Liver Function Tests: Recent Labs  Lab 05/08/17 0542  AST 24  ALT 19  ALKPHOS 81  BILITOT 0.9  PROT 7.6  ALBUMIN 2.5*   No results for input(s): LIPASE, AMYLASE in the last 168 hours. No results for input(s): AMMONIA in the last 168 hours.  CBC: Recent Labs  Lab 05/04/17 0400 05/05/17 0434 05/06/17 0454 05/07/17 0531 05/08/17 0542  WBC 6.3 5.9 6.5 3.9* 6.1  HGB 12.6* 12.0* 11.9* 15.3 12.3*  HCT 39.2 36.9* 36.5* 45.6 37.9*  MCV 85.8 85.6 85.5 85.1 86.1  PLT 193 194 229 177 271   Cardiac Enzymes: No results for input(s): CKTOTAL, CKMB, CKMBINDEX, TROPONINI in the last 168 hours. BNP: BNP (last 3 results) Recent Labs    11/06/16 1621 03/25/17 1401 04/21/17 1212  BNP 961.6* 835.4* 483.0*   ProBNP (last 3 results) No results for input(s): PROBNP in the last 8760 hours.  Other results:  Imaging: No results found.  Medications:    Scheduled Medications: . Chlorhexidine Gluconate Cloth  6 each Topical Daily  . digoxin  0.125 mg Oral Daily  . doxycycline  100 mg Oral Q12H  . feeding supplement (GLUCERNA SHAKE)  237 mL Oral Q24H  . feeding supplement (PRO-STAT SUGAR FREE 64)  30 mL Oral BID    . furosemide  80 mg Oral Daily  . insulin aspart  0-15 Units Subcutaneous TID WC  . insulin aspart  0-5 Units Subcutaneous QHS  . insulin glargine  10 Units Subcutaneous QHS  . sodium chloride flush  10-40 mL Intracatheter Q12H  . sodium chloride flush  3 mL Intravenous Q12H  . spironolactone  12.5 mg Oral Daily    Infusions: . sodium chloride Stopped (05/01/17 1001)  . sodium chloride Stopped (05/01/17 0800)  . sodium chloride    . amiodarone 30 mg/hr (05/08/17 0102)  . heparin 1,550 Units/hr (05/07/17 0702)  . milrinone 0.375 mcg/kg/min (05/08/17 0102)    PRN Medications:    Assessment:   Benjamin Sherman is a 47 y.o. 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 eosinophilic cardiomyopathy, LV thrombus, hypothyroidism, DM2, CKD Stage II-III and traumatic [facility] 11/18 after fall.   Admitted with cardiogenic shock.  Plan/Discussion:    1. Acute on chronic systolic HF -> cardiogenic shock: ECHO 11/2016 EF 25-30% CMRI EF 22%. Bedside echo in HF clinic EF 20% in 3/19.  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 Did not respond to steroids. Admitted 4/6 with cardiogenic shock and volume overload co-ox 38%. Echo 04/23/17: LVEF 20-25% with large LV clot, RV mild to moderately down. - IABP out. Swan pulled 05/01/17. - Coox 55% this am on milrinone 0.375 mcg/kg/min.  - If coox drops, will move back to unit for levophed and possible IABP to VAD.  - Back on IV lasix over weekend with elevated CVP. CVP 10 this morning. Starting 80 mg PO lasix daily.  - Continue digoxin 0.125. Level 0.4 4/22 - Continue spiro 12.5 mg daily. - No ACE/ARB/b-blocker with shock - Echo reviewed personally with LVEF 20% moderate RV dysfunction   - We have been unable to wean inotropic support and even on fairly high-dose milrinone he remains very tenuous. Pt will likely die soon if there is no durable option for him. Not transplant candidate due to limited support. VAD team now  evaluating. RV tenuous but may be sufficient. Could potentially get VAD Thursday. If accepted will need IABP back in early this week. Formal SW eval today. - If he is an LVAD candidate, will need to have teeth pulled. PFTs completed.  2. Atrial flutter: New onset. S/p DCCV to NSR on 4/12.  - Remains in NSR s/p DCCV.  - Continue amiodarone and heparin gtts for now pending possible IABP/VAD later this week  - INR 1.37. Off warfarin. Cont heparin drip with potential surgery.  - Previously off Xarelto due to [facility].  3. AKI CKD Stage II-III: Likely ATN due to shock - Creatinine 1.8  this am.  4. H/o LV Thrombus - now recurrent on echo 04/23/17: Off Xarelto due to previous SAH.  - Continue heparin. Warfarin on hold with possible surgery.  5. DMII - Cover with SSI plus lantus.  - No change.   6. Hypothyroidism: TFTs OK 04/22/17.  - TSH elevated at 10.848 this am. (normal on 4/8). Will add on free T4 and T3. Not on synthroid. Has been on Amio drip since 4/7.  7. RLE erythema/cellulitis - Started doxy 4/20. Continues to improve.   Alford Highland, NP  05/08/2017 8:24 AM   Advanced Heart Failure Team Pager [phone] (M-F; 7a - 4p)  Please contact [facility] for night-coverage after hours (4p -7a ) and weekends on [email or web address]  Patient seen and examined with the above-signed Advanced Practice Provider and/or Housestaff. I personally reviewed laboratory data, imaging studies and relevant notes. I independently examined the patient and formulated the important aspects of the plan. I have edited the note to reflect any of my changes or salient points. I have personally discussed the plan with the patient and/or family.  Remains inotrope dependent with marginal co-ox on fairly high-dose mirlinone. Renal function stable. Remains in NSR on IV amio. Long discussion with SW, him and his sister and friend (Gene) about ability to support him post-VAD. Although situation not ideal, I do think it is sufficient to  support VAD placement and out SW agrees. Will proceed with teeth extraction on Thursday and VAD next Tuesday. Continue heparin for. Will need IABP pre VAD.   [name], MD  5:16 PM

## 2017-05-08 NOTE — Progress Notes (Addendum)
ANTICOAGULATION CONSULT NOTE  Pharmacy Consult for Heparin Indication: atrial fibrillation/LV thrombus   Allergies  Allergen Reactions  . Bee Venom    Patient Measurements: Height: 6' (182.9 cm) Weight: 203 lb 4.8 oz (92.2 kg) IBW/kg (Calculated) : 77.6  Heparin dosing weight: 99 kg   Vital Signs: Temp: 98.1 F (36.7 C) (04/23 0444) Temp Source: Oral (04/23 0444) BP: 101/72 (04/23 0444) Pulse Rate: 92 (04/23 0444)  Labs: Recent Labs    05/06/17 0454 05/07/17 0531 05/07/17 1539 05/08/17 0542  HGB 11.9* 15.3  --  12.3*  HCT 36.5* 45.6  --  37.9*  PLT 229 177  --  271  LABPROT 17.5* 17.6*  --  16.7*  INR 1.45 1.46  --  1.37  HEPARINUNFRC 0.31 0.22* 0.51 0.55  CREATININE 1.85* 1.62*  --  1.81*   Assessment: 23 yoM with h/o HF admitted with volume overload and in cardiogenic shock. Started on heparin gtt for afib with RVR. No AC PTA - previously on rivaroxaban but fell, hit his head, and developed [facility] 11/18. Last head CT showed no increase hemorrhage. ECHO 4/8 showed LVEF 20-25% and large LV thrombus.   Heparin level therapeutic this morning at 0.55, on 1550 units/hr. Hgb stable at 12.3, plts 271. No signs/symptoms of bleeding. No infusion issues documented.   Goal of Therapy:  Heparin level 0.3-0.7 units/ml INR goal: 2-3 Monitor platelets by anticoagulation protocol: Yes   Plan:  Continue  heparin gtt to 1550 units/hr - will continue until 4/25 at 0400 (stopped for planned dental extraction) Monitor daily HL, CBC, s/sx of bleeding  Continue to hold warfarin for plans for IABP/LVAD work-up  Girard Cooter, PharmD Clinical Pharmacist  Pager: [phone] Clinical Phone for 05/08/2017 until 3:30pm: [phone] If after 3:30pm, please call main pharmacy at [phone] 05/08/2017 7:18 AM

## 2017-05-08 NOTE — Consult Note (Signed)
DENTAL CONSULTATION  Date of Consultation:  05/08/2017 Patient Name:   Benjamin Sherman Date of Birth:   02-15-1970 Medical Record Number: [id number]  VITALS: BP 103/67 (BP Location: Right Arm)   Pulse 93   Temp 98.2 F (36.8 C) (Oral)   Resp 17   Ht 6' (1.829 m)   Wt 203 lb 4.8 oz (92.2 kg)   SpO2 100%   BMI 27.57 kg/m   CHIEF COMPLAINT: Patient referred by Dr. Gala Romney and Dr. Donata Clay for dental consultation.  HPI: Benjamin Sherman is a 47 year old male with chronic heart failure and nonischemic cardiomyopathy.  Patient with anticipated left ventricular assist device placement.  Patient is now seen as part of a pre-LVAD placement dental protocol examination to rule out dental infection that may affect the patient's systemic health and anticipated LVAD placement.  The patient currently denies acute toothaches, swellings, or abscesses.  Patient knows that he has" many bad teeth".  Patient has not seen a dentist since 2008 when he had a dental cleaning in [place], [place].  Patient does not seek regular dental care.  Patient denies having dental phobia.  Patient denies having partial dentures.  The patient is interested in getting all remaining teeth extracted at this time.  PROBLEM LIST: Patient Active Problem List   Diagnosis Date Noted  . Advance care planning   . Goals of care, counseling/discussion   . Palliative care encounter   . PAF (paroxysmal atrial fibrillation) (HCC)   . Fluid overload 04/21/2017  . Cardiogenic shock (HCC)   . Acute on chronic systolic and diastolic heart failure, NYHA class 4 (HCC)   . Subarachnoid hemorrhage (HCC)   . Pneumothorax on right   . Syncope and collapse   . Chronic systolic CHF (congestive heart failure) (HCC)   . Acute thrombus of left ventricle (HCC)   . CKD (chronic kidney disease), stage III (HCC)   . Diabetes mellitus type 2, uncontrolled, with complications (HCC)   . Traumatic subarachnoid hemorrhage with loss of  consciousness of 30 minutes or less (HCC)   . Anticoagulated   . Syncope   . Hypotension   . Hyperkalemia   . Traumatic subarachnoid hematoma with loss of consciousness (HCC) 12/06/2016  . Hypothyroid 11/28/2016  . CKD (chronic kidney disease), stage II 11/28/2016  . DM II (diabetes mellitus, type II), controlled (HCC) 11/28/2016  . LV (left ventricular) mural thrombus without MI 11/28/2016  . CHF (congestive heart failure) (HCC) 11/06/2016    PMH: Past Medical History:  Diagnosis Date  . CHF (congestive heart failure) (HCC)   . Diabetes mellitus without complication (HCC)     PSH: Past Surgical History:  Procedure Laterality Date  . CARDIOVERSION N/A 04/27/2017   Procedure: CARDIOVERSION;  Surgeon: Dolores Patty, MD;  Location: [facility] OR;  Service: Cardiovascular;  Laterality: N/A;  . IABP INSERTION N/A 04/25/2017   Procedure: IABP INSERTION;  Surgeon: Dolores Patty, MD;  Location: [facility] INVASIVE CV LAB;  Service: Cardiovascular;  Laterality: N/A;  . RIGHT HEART CATH N/A 04/25/2017   Procedure: RIGHT HEART CATH;  Surgeon: Dolores Patty, MD;  Location: [facility] INVASIVE CV LAB;  Service: Cardiovascular;  Laterality: N/A;  . RIGHT/LEFT HEART CATH AND CORONARY ANGIOGRAPHY N/A 11/10/2016   Procedure: RIGHT/LEFT HEART CATH AND CORONARY ANGIOGRAPHY;  Surgeon: Dolores Patty, MD;  Location: [facility] INVASIVE CV LAB;  Service: Cardiovascular;  Laterality: N/A;    ALLERGIES: Allergies  Allergen Reactions  . Bee Venom     MEDICATIONS: Current  Facility-Administered Medications  Medication Dose Route Frequency Provider Last Rate Last Dose  . 0.9 %  sodium chloride infusion   Intravenous Continuous Clegg, Amy D, NP   Stopped at 05/01/17 1001  . 0.9 %  sodium chloride infusion  250 mL Intravenous Continuous Clegg, Amy D, NP   Stopped at 05/01/17 0800  . 0.9 %  sodium chloride infusion   Intravenous Once Clegg, Amy D, NP      . amiodarone (NEXTERONE PREMIX) 360-4.14 MG/200ML-% (1.8  mg/mL) IV infusion  30 mg/hr Intravenous Continuous Clegg, Amy D, NP 16.7 mL/hr at 05/08/17 0102 30 mg/hr at 05/08/17 0102  . Chlorhexidine Gluconate Cloth 2 % PADS 6 each  6 each Topical Daily Clegg, Amy D, NP   6 each at 05/07/17 1000  . digoxin (LANOXIN) tablet 0.125 mg  0.125 mg Oral Daily Clegg, Amy D, NP   0.125 mg at 05/08/17 0911  . doxycycline (VIBRA-TABS) tablet 100 mg  100 mg Oral Q12H Bensimhon, Bevelyn Buckles, MD   100 mg at 05/08/17 0910  . feeding supplement (GLUCERNA SHAKE) (GLUCERNA SHAKE) liquid 237 mL  237 mL Oral Q24H Bensimhon, Bevelyn Buckles, MD   237 mL at 05/07/17 1744  . feeding supplement (PRO-STAT SUGAR FREE 64) liquid 30 mL  30 mL Oral BID Bensimhon, Bevelyn Buckles, MD   30 mL at 05/08/17 0910  . furosemide (LASIX) tablet 80 mg  80 mg Oral Daily Graciella Freer, PA-C   80 mg at 05/08/17 0910  . heparin ADULT infusion 100 units/mL (25000 units/273mL sodium chloride 0.45%)  1,550 Units/hr Intravenous Continuous Bensimhon, Bevelyn Buckles, MD 15.5 mL/hr at 05/07/17 0702 1,550 Units/hr at 05/07/17 0702  . insulin aspart (novoLOG) injection 0-15 Units  0-15 Units Subcutaneous TID WC Clegg, Amy D, NP   2 Units at 05/07/17 1743  . insulin aspart (novoLOG) injection 0-5 Units  0-5 Units Subcutaneous QHS Clegg, Amy D, NP   2 Units at 04/25/17 2212  . insulin glargine (LANTUS) injection 10 Units  10 Units Subcutaneous QHS Clegg, Amy D, NP   10 Units at 05/07/17 2122  . magnesium hydroxide (MILK OF MAGNESIA) suspension 30 mL  30 mL Oral Daily PRN Clegg, Amy D, NP   30 mL at 05/01/17 0907  . milrinone (PRIMACOR) 20 MG/100 ML (0.2 mg/mL) infusion  0.375 mcg/kg/min Intravenous Continuous Graciella Freer, PA-C 10.6 mL/hr at 05/08/17 0102 0.375 mcg/kg/min at 05/08/17 0102  . ondansetron (ZOFRAN) injection 4 mg  4 mg Intravenous Q4H PRN Clegg, Amy D, NP   4 mg at 04/27/17 0048  . sodium chloride flush (NS) 0.9 % injection 10-40 mL  10-40 mL Intracatheter Q12H Clegg, Amy D, NP   10 mL at 05/05/17 2207   . sodium chloride flush (NS) 0.9 % injection 10-40 mL  10-40 mL Intracatheter PRN Clegg, Amy D, NP      . sodium chloride flush (NS) 0.9 % injection 3 mL  3 mL Intravenous Q12H Clegg, Amy D, NP   3 mL at 05/07/17 0952  . sodium chloride flush (NS) 0.9 % injection 3 mL  3 mL Intravenous PRN Clegg, Amy D, NP   3 mL at 05/02/17 2113  . spironolactone (ALDACTONE) tablet 12.5 mg  12.5 mg Oral Daily Clegg, Amy D, NP   12.5 mg at 05/08/17 0911    LABS: Lab Results  Component Value Date   WBC 6.1 05/08/2017   HGB 12.3 (L) 05/08/2017   HCT 37.9 (L) 05/08/2017   MCV  86.1 05/08/2017   PLT 271 05/08/2017      Component Value Date/Time   NA 133 (L) 05/08/2017 0542   K 4.1 05/08/2017 0542   CL 101 05/08/2017 0542   CO2 23 05/08/2017 0542   GLUCOSE 114 (H) 05/08/2017 0542   BUN 30 (H) 05/08/2017 0542   CREATININE 1.81 (H) 05/08/2017 0542   CALCIUM 8.7 (L) 05/08/2017 0542   GFRNONAA 43 (L) 05/08/2017 0542   GFRAA 49 (L) 05/08/2017 0542   Lab Results  Component Value Date   INR 1.37 05/08/2017   INR 1.46 05/07/2017   INR 1.45 05/06/2017   No results found for: PTT  SOCIAL HISTORY: Social History   Socioeconomic History  . Marital status: Single    Spouse name: Not on file  . Number of children: Not on file  . Years of education: Not on file  . Highest education level: Not on file  Occupational History  . Occupation: Diplomatic Services operational officer Needs  . Financial resource strain: Not on file  . Food insecurity:    Worry: Not on file    Inability: Not on file  . Transportation needs:    Medical: Not on file    Non-medical: Not on file  Tobacco Use  . Smoking status: Former Smoker    Types: Cigars    Last attempt to quit: 11/16/2016    Years since quitting: 0.4  . Smokeless tobacco: Never Used  . Tobacco comment: 6 cigars per wk  Substance and Sexual Activity  . Alcohol use: Yes    Alcohol/week: 8.4 oz    Types: 12 Cans of beer, 2 Shots of liquor per week    Comment: occ  . Drug  use: No  . Sexual activity: Not on file  Lifestyle  . Physical activity:    Days per week: Not on file    Minutes per session: Not on file  . Stress: Not on file  Relationships  . Social connections:    Talks on phone: Not on file    Gets together: Not on file    Attends religious service: Not on file    Active member of club or organization: Not on file    Attends meetings of clubs or organizations: Not on file    Relationship status: Not on file  . Intimate partner violence:    Fear of current or ex partner: Not on file    Emotionally abused: Not on file    Physically abused: Not on file    Forced sexual activity: Not on file  Other Topics Concern  . Not on file  Social History Narrative  . Not on file    FAMILY HISTORY: Family History  Problem Relation Age of Onset  . CAD Father     REVIEW OF SYSTEMS: Reviewed with the patient as per History of present illness. Psych: Patient denies having dental phobia.  DENTAL HISTORY: CHIEF COMPLAINT: Patient referred by Dr. Gala Romney and Dr. Donata Clay for dental consultation.  HPI: Benjamin Sherman is a 47 year old male with chronic heart failure and nonischemic cardiomyopathy.  Patient with anticipated left ventricular assist device placement.  Patient is now seen as part of a pre-LVAD placement dental protocol examination to rule out dental infection that may affect the patient's systemic health and anticipated LVAD placement.  The patient currently denies acute toothaches, swellings, or abscesses.  Patient knows that he has "many bad teeth".  Patient has not seen a dentist since 2008 when he had a  dental cleaning in [place], [place].  Patient does not seek regular dental care.  Patient denies having dental phobia.  Patient denies having partial dentures.  The patient is interested in getting all remaining teeth extracted at this time.  DENTAL EXAMINATION: GENERAL: The patient is a well-developed, well-nourished male in  no acute distress. HEAD AND NECK: There is no left neck lymphadenopathy.  I am unable to evaluate the right neck at this time.  Patient denies acute TMJ symptoms. INTRAORAL EXAM: The patient has normal saliva.  There is no evidence of oral abscess formation. DENTITION: Patient has multiple missing teeth.  There is supraeruption and  drifting of the unopposed teeth into the edentulous areas. PERIODONTAL: The patient has chronic, advanced periodontal disease with plaque and calculus accumulations, generalized gingival recession, and generalized tooth mobility.  There is moderate to severe bone loss noted.  Radiographic calculus is noted. DENTAL CARIES/SUBOPTIMAL RESTORATIONS: Multiple dental caries are noted. ENDODONTIC: The patient currently denies acute pulpitis symptoms.  Patient appears to have multiple areas of periapical pathology and radiolucency. CROWN AND BRIDGE: There are no crown or bridge restorations. PROSTHODONTIC: Patient denies having partial dentures. OCCLUSION: The patient has a poor occlusal scheme secondary to multiple missing teeth, super eruption drifting the unopposed teeth into the edentulous areas, and lack replacement of missing teeth with clinically acceptable dental prostheses.  RADIOGRAPHIC INTERPRETATION: An orthopantogram was taken on 05/04/2017. There are multiple missing teeth.  There is supraeruption and drifting of the unopposed teeth into the edentulous areas.  There is moderate to severe bone loss noted.  Tooth #17 and 32 are angulated medially.  There is radiographic calculus noted.  Dental caries are noted.  There is periapical pathology and radiolucency noted at the apices of multiple teeth.   ASSESSMENTS: 1.  Heart failure and nonischemic cardiomyopathy 2.  Pre-LVAD placement dental protocol 3.  Chronic apical periodontitis 4.  Dental caries 5.  Chronic periodontitis with bone loss 6.  Generalized gingival recession 7.  Accretions 8.  Tooth mobility 9.   Multiple missing teeth 10.  Supraeruption and drifting of the unopposed teeth into the edentulous areas 11.  Poor occlusal scheme and malocclusion 12.  Risk of bleeding with invasive dental procedures due to current heparin therapy 13.  Risk for complications up to and including death due with anticipated invasive dental procedures in the operating with general anesthesia due to patient's overall cardiovascular and respiratory compromise.     PLAN/RECOMMENDATIONS: 1. I discussed the risks, benefits, and complications of various treatment options with the patient in relationship to his medical and dental conditions, anticipated LVAD placement, and risk for endocarditis. We discussed various treatment options to include no treatment, total and subtotal extractions with alveoloplasty, pre-prosthetic surgery as indicated, periodontal therapy, dental restorations, root canal therapy, crown and bridge therapy, implant therapy, and replacement of missing teeth as indicated. The patient currently wishes to proceed with extraction of remaining teeth with alveoloplasty in the operating room with general anesthesia.  This has been scheduled for this coming Thursday, 05/10/2017 at approximately 10 AM to follow my first case.  The heparin therapy was discontinued 6 hours prior to dental procedures and held until 12 hours after the dental procedure with no bolus at that time.  The patient will then need to follow-up with a general dentist of his choice for fabrication of upper and lower complete dentures after adequate healing and once medically stable from the anticipated LVAD procedure.   2. Discussion of findings with medical team  and coordination of future medical and dental care as needed.    Charlynne Pander, DDS

## 2017-05-09 ENCOUNTER — Inpatient Hospital Stay: Payer: Self-pay

## 2017-05-09 LAB — COOXEMETRY PANEL
Carboxyhemoglobin: 0.9 % (ref 0.5–1.5)
Methemoglobin: 1.1 % (ref 0.0–1.5)
O2 Saturation: 55 %
Total hemoglobin: 13 g/dL (ref 12.0–16.0)

## 2017-05-09 LAB — BASIC METABOLIC PANEL
Anion gap: 9 (ref 5–15)
BUN: 28 mg/dL — ABNORMAL HIGH (ref 6–20)
CO2: 27 mmol/L (ref 22–32)
Calcium: 9 mg/dL (ref 8.9–10.3)
Chloride: 100 mmol/L — ABNORMAL LOW (ref 101–111)
Creatinine, Ser: 1.7 mg/dL — ABNORMAL HIGH (ref 0.61–1.24)
GFR calc Af Amer: 53 mL/min — ABNORMAL LOW (ref >60)
GFR calc non Af Amer: 46 mL/min — ABNORMAL LOW (ref >60)
Glucose, Bld: 89 mg/dL (ref 65–99)
Potassium: 4.1 mmol/L (ref 3.5–5.1)
Sodium: 136 mmol/L (ref 135–145)

## 2017-05-09 LAB — CBC
HCT: 37.4 % — ABNORMAL LOW (ref 39.0–52.0)
Hemoglobin: 12 g/dL — ABNORMAL LOW (ref 13.0–17.0)
MCH: 27.7 pg (ref 26.0–34.0)
MCHC: 32.1 g/dL (ref 30.0–36.0)
MCV: 86.4 fL (ref 78.0–100.0)
Platelets: 290 10*3/uL (ref 150–400)
RBC: 4.33 MIL/uL (ref 4.22–5.81)
RDW: 16.1 % — ABNORMAL HIGH (ref 11.5–15.5)
WBC: 5.7 10*3/uL (ref 4.0–10.5)

## 2017-05-09 LAB — GLUCOSE, CAPILLARY
Glucose-Capillary: 89 mg/dL (ref 65–99)
Glucose-Capillary: 137 mg/dL — ABNORMAL HIGH (ref 65–99)
Glucose-Capillary: 164 mg/dL — ABNORMAL HIGH (ref 65–99)
Glucose-Capillary: 201 mg/dL — ABNORMAL HIGH (ref 65–99)

## 2017-05-09 LAB — PROTIME-INR
INR: 1.32
Prothrombin Time: 16.2 seconds — ABNORMAL HIGH (ref 11.4–15.2)

## 2017-05-09 LAB — HEPARIN LEVEL (UNFRACTIONATED): Heparin Unfractionated: 0.64 IU/mL (ref 0.30–0.70)

## 2017-05-09 LAB — T3, FREE: T3, Free: 2.5 pg/mL (ref 2.0–4.4)

## 2017-05-09 LAB — MAGNESIUM: Magnesium: 2.2 mg/dL (ref 1.7–2.4)

## 2017-05-09 MED ORDER — PREDNISONE 20 MG PO TABS
40.0000 mg | ORAL_TABLET | Freq: Every day | ORAL | Status: AC
  Administered 2017-05-09 – 2017-05-11 (×3): 40 mg via ORAL
  Filled 2017-05-09 (×3): qty 2

## 2017-05-09 MED ORDER — SODIUM CHLORIDE 0.9% FLUSH: 10.0000 mL | INTRAVENOUS | Status: DC | PRN

## 2017-05-09 MED ORDER — SODIUM CHLORIDE 0.9% FLUSH
10.0000 mL | Freq: Two times a day (BID) | INTRAVENOUS | Status: DC
  Administered 2017-05-09 – 2017-05-14 (×6): 10 mL

## 2017-05-09 MED ORDER — ALLOPURINOL 100 MG PO TABS
100.0000 mg | ORAL_TABLET | Freq: Every day | ORAL | Status: DC
Start: 2017-05-09 — End: 2017-05-09
  Administered 2017-05-09: 100 mg via ORAL
  Filled 2017-05-09: qty 1

## 2017-05-09 NOTE — Plan of Care (Signed)
  Problem: Safety: Goal: Ability to remain free from injury will improve Outcome: Progressing Note:  Steady gait; verbalizes agreement to call staff for assistance out of bed.

## 2017-05-09 NOTE — Progress Notes (Signed)
Chaplain responded to consult on patient. Chaplain spent time talking with the patient and and advised him of availability whenever needed. Patient said that he had no needs at the present.

## 2017-05-09 NOTE — Progress Notes (Signed)
Palliative:  I met again today with Benjamin Sherman. He continues to have short curt answers and flat affect until he speaks of his experience growing up and his personal time in the [employer] or with sports. He became very animated and enjoyed these topics. He quickly became more at ease and very talkative.   We also discussed upcoming dental procedure in preparation for LVAD. Benjamin Sherman says that he feels better about procedure after discussing with Dr. Prescott Gum. Revisited HCPOA document and he retrieved this from bag and this is filled out and completed and just needs to be notarized. Will request notary from spiritual care department for completion. He has no questions regarding this form. No further questions/concerns regarding upcoming LVAD. Emotional support provided.   Sitting up in recliner. No distress or SOB on room air. Able to walk with ease and looking forward to "doing more laps."  25 min  Vinie Sill, NP Palliative Medicine Team Pager # [phone] (M-F 8a-5p) Team Phone # [phone] (Nights/Weekends)

## 2017-05-09 NOTE — Progress Notes (Signed)
LVAD Initial Psychosocial Screening  Date/Time Initiated:  05/08/17 3pm Referral Source:  Benjamin Sherman, VAD Coordinator Referral Reason:  LVAD implant Source of Information:  Pt. Benjamin Sherman- sister, Benjamin-friend and chart review  Demographics Name:  Benjamin Sherman Address:  [address] [place] [address]  Temporary Address: [address]              [place], [place] [address] Home phone: n/a    Cell: [phone] Marital Status:  Single  Faith:  "spiritual" No formalized religion Primary Language:  English SS: [id number]     DOB:  06-25-1970  Medical & Follow-up Adherence to Medical regimen/INR checks: Other:Patient states that he has a car but often had no money for gas to get to appointments.   Medication adherence: Other:Patient challenged to obtain medications due to no insurance. he now has medicaid.   Physician/Clinic Appointment Attendance: Other:Had difficulties in the past due to lack of income and insurance. Patient now has medicaid and pending disability. CSW had lenghty discussion around adherence to care plan with an LVAD.   Advance Directives: Do you have a Living Will or Medical POA? Pending  Would you like to complete a Living Will and Medical POA prior to surgery?  Yes Do you have Goals of Care? Yes  Have you had a consult with the Palliative Care Team at [facility]? Yes  Psychological Health Appearance:  In hospital gown Mental Status:  Alert, oriented Eye Contact:  Other (Comment) Good although often would stare out the window as if in deep thought Thought Content:  Coherent Speech:  Logical/coherent Mood:  Depressed and Despair  Affect:  Depressed, Sad and Flat Insight:  Good Judgement: Unimpaired Interaction Style:  Guarded  Family/Social Information Who lives in your home? Name:   Relationship:   Benjamin Sherman Friend  (Patient will move in with Benjamin Sherman post hospitalization. He was previously renting a room which is no longer an option to return post  hospital.)  Other family members/support persons in your life? Name:   Relationship:   Benjamin Sherman Sister (niece) [phone] Benjamin Maduro "Benjamin" Harland Sherman Friend  [phone] Benjamin Sherman   Benjamin Sherman (lives at [facility] in [place])  Caregiving Needs Who is the primary caregiver? Benjamin Sherman Health status:  Good Do you drive?  Yes Do you work?  Yes Physical Limitations:  none Do you have other care giving responsibilities?  no Contact number:[phone]  Who is the secondary caregiver? Benjamin Sherman Health status:  Good Do you drive?  yes Do you work?  yes Physical Limitations:  none Do you have other care giving responsibilities?  no Contact number:[phone]  Home Environment/Personal Care Do you have reliable phone service? Yes  If so, what is the number?  [phone] Stra[employer] Monthly plan Do you own or rent your home? N/a (will initially stay with friend rent free) Current mortgage/rent: 0 Number of steps into the home? 0 How many levels in the home? 1 Assistive devices in the home? none Electrical needs for LVAD (3 prong outlets)? yes Second hand smoke exposure in the home? Benjamin Sherman smokes outside Travel distance from [place]? 35 minutes  Community Are you active with community agencies/resources/homecare? No Agency Name: n/a Are you active in a church, synagogue, mosque or other faith based community? No Faith based institutions name: no formalized religion What other sources do you have for spiritual support? no Are you active in any clubs or social organizations? Benjamin Sherman, Benjamin Sherman and [employer] What do you do for fun?  Hobbies?  Interests? Golf and [place]  Education/Work Information What is the last grade of school you completed?  12 th grade Preferred method of learning?  Written Do you have any problems with reading or writing?  No Are you currently employed?  Yes  When were you last employed? Day before admission  Name of employer? 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 6 years If you are not working, do you plan to return to work after VAD surgery? No If yes, what type of employment do you hope to find? Are you interested in job training or learning new skills?  No Did you serve in the military? No  If so, what branch? Other  Financial Information What is your source of income? Pending disability Do you have difficulty meeting your monthly expenses? Yes If yes, which ones? all How do you cope with this? "go back to work" Can you budget for the monthly cost for dressing supplies post procedure? Yes Have medicaid now  Primary Health insurance:  Medicaid Secondary Insurance: n/a Prescription plan: Medicaid What are your prescription co-pays? Varies $3 Do you use mail order for your prescriptions?  No Have you ever had to refuse medication due to cost?  Yes Have you applied for Medicaid? Approved  Have you applied for Social Security Disability (SSI)  Pending  Medical Information Briefly describe why you are here for evaluation: Patient describes hospitalization in October 2018 where he had 52 lbs of fluid removed and a new diagnosis of Heart failure. Do you have a PCP or other medical provider? Patient, No Pcp Per Are you able to complete your ADL's? yes Do you have a history of trauma, physical, emotional, or sexual abuse? no Do you have any family history of heart problems? Father had 5x MI Do you smoke now or past usage? Yes Cigars on the weekend Do you drink alcohol now or past usage? past usage   Socially but none recently Are you currently using illegal drugs or misuse of medication or past usage? never  Have you ever been treated for substance abuse? No      If yes, where and when did you receive treatment?  Mental Health History How have you been feeling in the past year? "Bad" Have you ever had any problems with depression, anxiety  or other mental health issues? "some after my Mother went to the SNF" Do you see a counselor, psychiatrist or therapist?  no If you are currently experiencing problems are you interested in talking with a professional? No Have you or are you taking medications for anxiety/depression or any mental health concerns?  No  Current Medications: n/a What are your coping strategies under stressful situations? "counsel with friends" Are there any other stressors in your life? Housing and disability/income Have you had any past or current thoughts of suicide? no How many hours do you sleep at night? 1 hr recently due to HF symptoms How is your appetite? good Would you be interested in attending the LVAD support group? Yes  PHQ2 Depression Scale: 2 PHQ9 Depression scale (if positive PHQ2 screen):     Legal Do you currently have any legal issues/problems?  no Have you had any legal issues/problems in the past?  no Do you have a Durable POA?  no     Plan for VAD Implementation Do you know and understand what happens during the VAD surgery? Patient [employer]  Understanding  of surgery and able to describe details What do you know about the risks and side effect associated with VAD surgery? Patient Verbalizes Understanding  of risks (infection, stroke and death) Explain what will happen right after surgery: Patient Verbalizes Understanding  of OR to ICU and will be intubated What is your plan for transportation for the first 8 weeks post-surgery? (Patients are not recommended to drive post-surgery for 8 weeks)  Driver:    Benjamin and Benjamin Kobus Do you have airbags in your vehicle?  There is a risk of discharging the device if the airbag were to deploy. What do you know about your diet post-surgery? Patient Verbalizes Understanding  of Heart healthy How do you plan to monitor your medications, current and future?   Will use a pill box How do you plan to complete ADL's post-surgery?  Ask for help Will it be  difficult to ask for help from your caregivers?  no  Please explain what you hope will be improved about your life as a result of receiving the LVAD? Improved Quality of life Please tell me your biggest concern or fear about living with the LVAD?  none How do you cope with your concerns and fears?  Tackle them head on Please explain your understanding of how their body will change?  Take it day by day Are you worried about these changes? Take it day by day Do you see any barriers to your surgery or follow-up? none  Understanding of LVAD Patient states understanding of the following: Surgical procedures and risks, Electrical need for LVAD (3 prong outlets), Safety precautions with LVAD (water, etc.), LVAD daily self-care (dressing changes, computer check, extra supplies), Outpatient follow up (LVAD clinic appts, monitoring blood thinners) and Need for Emergency Planning  Discussed and Reviewed with Patient and Caregiver  Patient's current level of motivation to prepare for LVAD: motivated although states "I don't want to be a burden on anyone" Patient's present Level of Consent for LVAD: Ready    Education provided to patient/family/caregiver:   Caregiver role and responsibiltiy, Financial planning for LVAD, Role of Clinical Social Worker and Signs of Depression and Anxiety  Comments:  Discussed at length the caregiver role and level of commitment needed by family/friend. CSW will assist with follow up regarding SSA disability and assistance needed by family/friend. Provided supportive counseling around issues of depression and anxiety and importance of addressing during recovery if needed.   Caregiver questions Please explain what you hope will be improved about your life and loved one's life as a result of receiving the LVAD?  More strength and endurance and ability to enjoy more outside activities. What is your biggest concern or fear about caregiving with an LVAD patient?  That he won't  tell us what he needs. What is your plan for availability to provide care 24/7 x2 weeks post op and dressing changes ongoing?  Patient's sister Benjamin Sherman will provided over night care and Benjamin and family will provide care and supervision during the day hours. Who is the relief/backup caregiver and what is their availability? Benjamin Sherman and will be available at night for over night supervision the first 2 weeks   Preferred method of learning? Written and Hands on  Do you drive? yes How do you handle stressful situations?  Reading and Sudoko puzzles Do you think you can do this? yes Is there anything that concerns about caregiving?  no Do you provide caregiving to anyone else? no  Caregiver's current level of motivation to  prepare for LVAD: very motivated Caregiver's present level of consent for LVAD: Ready  Clinical Interventions Needed:    CSW will monitor signs and symptoms of depression and assist with adjustment to life with an LVAD and the importance of adherence to the plan of care.  CSW will continue to provide supportive counseling as patient self identified depressive symptoms although not taking any medications at this time.  CSW will refer patient for AD with Palliative Care/[place] office for completion prior to suregry.  CSW will assist with follow up and advocacy for [facility] Disability application as patient currently has no income. CSW will assist family/friend with permanent housing options post implant recovery. Patient appears to have a solid plan for temporary housing next door to primary caregiver Benjamin during VAD recovery period.  CSW encouraged attendance with the LVAD Support Group to assist further with adjustment and post implant peer support.  Clinical Impressions/Recommendations:   Mr. Mormile is a 47yo male who never married and has no children. He lived with his mother in the family home until last summer when his mother was placed in a SNF and the family home was sold. He was  renting a room until this recent hospitalization and inability to work to pay rent has left him with minimal options. Patient's friend and primary caregiver has offered a temporary option with his next door neighbor/family friend. Patient will move there post hospitalization until recovered from surgery and able to live independently. Patient's sister Benjamin Sherman and friend Benjamin will assist with housing throughout recovery and post recovery. He admits to some adherence issues in the past to the medical plan but with Medicaid approval and better support Sherman in place he is confident he will have the resources to have improved adherence to medical regimen. He is process of completing AD and discussed goals of care with Palliative Care.  Patient's primary caregiver will be Benjamin friend and his sister Benjamin Sherman will be back up. Both caregivers appear very supportive and willing to provide needed care and support to patient who appears to struggle with "being a burden on people". Patient worked as a Electrical engineer for the past 6 years and does not anticipate he will be able to return post implant. Patient thinks he has a pending Disability application with Social Security and CSW and family will assist with follow up. Patient denies any history of trauma, alcohol or substance use although admits to smoking cigars on the weekends. He states he has had some depressive symptoms since last summer when his Benjamin Sherman was placed in a SNF but denies any professional intervention. He states that he seeks "counsel from friends" as his coping mechanism. He denies any mental health history and scored a 2 on the PHQ-2. He verbalizes understanding of the VAD surgery and recovery process. He hopes to have improved quality of life post VAD and return to full independence. He states that he tackles his concerns and fears head on and "will take it day by day". Patient appears to be motivated and fiercely independent. He appears to be a good candidate  for LVAD implant with supportive family/friends. CSW would recommend patient for LVAD implant and will monitor closely for assistance with above mentioned interventions.   Shane Crutch, CCSW-MCS [phone]

## 2017-05-09 NOTE — Progress Notes (Signed)
Peripherally Inserted Central Catheter/Midline Placement  The IV Nurse has discussed with the patient and/or persons authorized to consent for the patient, the purpose of this procedure and the potential benefits and risks involved with this procedure.  The benefits include less needle sticks, lab draws from the catheter, and the patient may be discharged home with the catheter. Risks include, but not limited to, infection, bleeding, blood clot (thrombus formation), and puncture of an artery; nerve damage and irregular heartbeat and possibility to perform a PICC exchange if needed/ordered by physician.  Alternatives to this procedure were also discussed.  Bard Power PICC patient education guide, fact sheet on infection prevention and patient information card has been provided to patient /or left at bedside.    PICC/Midline Placement Documentation  PICC Triple Lumen 05/09/17 PICC Right Brachial 41 cm 1 cm (Active)  Indication for Insertion or Continuance of Line Chronic illness with exacerbations (CF, Sickle Cell, etc.) 05/09/2017  5:47 PM  Exposed Catheter (cm) 1 cm 05/09/2017  5:47 PM  Site Assessment Clean;Dry;Intact 05/09/2017  5:47 PM  Lumen #1 Status Flushed;Saline locked;Blood return noted 05/09/2017  5:47 PM  Lumen #2 Status Flushed;Saline locked;Blood return noted 05/09/2017  5:47 PM  Lumen #3 Status Flushed;Saline locked;Blood return noted 05/09/2017  5:47 PM  Dressing Type Transparent 05/09/2017  5:47 PM  Dressing Status Clean;Dry;Intact;Antimicrobial disc in place 05/09/2017  5:47 PM  Dressing Change Due 05/16/17 05/09/2017  5:47 PM       Luellen Howson, Lajean Manes 05/09/2017, 5:48 PM

## 2017-05-09 NOTE — Progress Notes (Addendum)
Patient ID: Benjamin Sherman, male   DOB: 07-10-1970, 47 y.o.   MRN: [id number]     Advanced Heart Failure Rounding Note  Subjective:    Admitted 04/21/17 with cardiogenic shock and atrial flutter.   Central line placed. Initial co-ox 39%.  IABP placed 04/25/17-> 04/30/17 with persistent cardiogenic shock.   DCCV atrial flutter 4/12.  Maintaining NSR on amio drip.  Coox 55% this am on Milrinone 0.375 mcg/kg/min. Weight down 1 lb. CVP 6-7  No CP, SOB, or dizziness. Ambulating to bathroom with no problems. Feels more confident now with friend's support. Erythema improving on ankle.   Formal SW eval yesterday with pt's friends. Planning for LVAD Tuesday. Teeth extractions tomorrow. Will need IABP back in prior to VAD.   Echo 4/8: LVEF 20-25% with LV apical clot. RV mild to moderately down.  Repeat echo 4/19 unchanged RV moderately down   Creatinine 2.02 => 1.73=> 1.7 => 1.85 => 1.93=> 1.92 => 1.7 => 1.8 => 1.6 => 1.8 => 1.7  Objective:   Weight Range:  Vital Signs:   Temp:  [97.8 F (36.6 C)-98.4 F (36.9 C)] 98.1 F (36.7 C) (04/24 0437) Pulse Rate:  [92-97] 93 (04/24 0437) Resp:  [15-17] 15 (04/24 0437) BP: (94-108)/(58-82) 94/77 (04/24 0437) SpO2:  [97 %-100 %] 100 % (04/24 0437) Weight:  [202 lb 8 oz (91.9 kg)] 202 lb 8 oz (91.9 kg) (04/24 0437) Last BM Date: 05/07/17  Weight change: Filed Weights   05/07/17 0521 05/08/17 0444 05/09/17 0437  Weight: 203 lb (92.1 kg) 203 lb 4.8 oz (92.2 kg) 202 lb 8 oz (91.9 kg)   Intake/Output:   Intake/Output Summary (Last 24 hours) at 05/09/2017 0736 Last data filed at 05/09/2017 0646 Gross per 24 hour  Intake 1767.33 ml  Output 2500 ml  Net -732.67 ml    Physical Exam   CVP: 6-7 General: Sitting in chair No resp difficulty. HEENT: Normal anicteric Neck: Supple. JVP 6-7. Carotids 2+ bilat; no bruits. No thyromegaly or nodule noted. Cor: PMI laterally displaced. RRR, +S3 Lungs: CTAB, normal effort. No wheeze Abdomen: Soft,  non-tender, non-distended, no HSM. No bruits or masses. +BS  Extremities: No cyanosis, clubbing, or rash. LLE no edema. Right ankle with mild erythema and mild edema. Warm.  Neuro: alert & oriented x 3, cranial nerves grossly intact. moves all 4 extremities w/o difficulty. Affect pleasant    Telemetry    SR 90s. Personally reviewed.  EKG   No new tracings.   Labs    Basic Metabolic Panel: Recent Labs  Lab 05/05/17 0434 05/06/17 0454 05/07/17 0531 05/08/17 0542 05/09/17 0414  NA 132* 132* 132* 133* 136  K 4.0 3.9 3.8 4.1 4.1  CL 98* 99* 99* 101 100*  CO2 24 24 24 23 27   GLUCOSE 151* 123* 112* 114* 89  BUN 28* 28* 29* 30* 28*  CREATININE 1.77* 1.85* 1.62* 1.81* 1.70*  CALCIUM 8.8* 8.8* 8.8* 8.7* 9.0  MG 1.9 1.8 1.8 1.8 2.2   Liver Function Tests: Recent Labs  Lab 05/08/17 0542  AST 24  ALT 19  ALKPHOS 81  BILITOT 0.9  PROT 7.6  ALBUMIN 2.5*   No results for input(s): LIPASE, AMYLASE in the last 168 hours. No results for input(s): AMMONIA in the last 168 hours.  CBC: Recent Labs  Lab 05/05/17 0434 05/06/17 0454 05/07/17 0531 05/08/17 0542 05/09/17 0414  WBC 5.9 6.5 3.9* 6.1 5.7  HGB 12.0* 11.9* 15.3 12.3* 12.0*  HCT 36.9* 36.5* 45.6 37.9* 37.4*  MCV 85.6 85.5 85.1 86.1 86.4  PLT 194 229 177 271 290   Cardiac Enzymes: No results for input(s): CKTOTAL, CKMB, CKMBINDEX, TROPONINI in the last 168 hours. BNP: BNP (last 3 results) Recent Labs    11/06/16 1621 03/25/17 1401 04/21/17 1212  BNP 961.6* 835.4* 483.0*   ProBNP (last 3 results) No results for input(s): PROBNP in the last 8760 hours.  Other results:  Imaging: No results found.  Medications:    Scheduled Medications: . amiodarone  200 mg Oral BID  . Chlorhexidine Gluconate Cloth  6 each Topical Daily  . Chlorhexidine Gluconate Cloth  6 each Topical Daily  . digoxin  0.125 mg Oral Daily  . doxycycline  100 mg Oral Q12H  . feeding supplement (GLUCERNA SHAKE)  237 mL Oral Q24H  .  feeding supplement (PRO-STAT SUGAR FREE 64)  30 mL Oral BID  . furosemide  80 mg Oral Daily  . insulin aspart  0-15 Units Subcutaneous TID WC  . insulin aspart  0-5 Units Subcutaneous QHS  . insulin glargine  10 Units Subcutaneous QHS  . mupirocin ointment  1 application Nasal BID  . sodium chloride flush  10-40 mL Intracatheter Q12H  . sodium chloride flush  3 mL Intravenous Q12H  . spironolactone  12.5 mg Oral Daily    Infusions: . sodium chloride Stopped (05/01/17 1001)  . sodium chloride Stopped (05/01/17 0800)  . sodium chloride    . [START ON 05/10/2017]  ceFAZolin (ANCEF) IV    . heparin 1,550 Units/hr (05/09/17 1610)  . milrinone 0.375 mcg/kg/min (05/08/17 2341)    PRN Medications:    Assessment:   Benjamin Sherman is a 47 y.o. 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 eosinophilic cardiomyopathy, LV thrombus, hypothyroidism, DM2, CKD Stage II-III and traumatic [facility] 11/18 after fall.   Admitted with cardiogenic shock.  Plan/Discussion:    1. Acute on chronic systolic HF -> cardiogenic shock: ECHO 11/2016 EF 25-30% CMRI EF 22%. Bedside echo in HF clinic EF 20% in 3/19.  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 Did not respond to steroids. Admitted 4/6 with cardiogenic shock and volume overload co-ox 38%. Echo 04/23/17: LVEF 20-25% with large LV clot, RV mild to moderately down. - IABP out. Swan pulled 05/01/17. - Coox 55% this am on milrinone 0.375 mcg/kg/min.  - If coox drops, will move back to unit for levophed and possible IABP to VAD.  - Volume looks good on exam. CVP 6-7. Continue 80 mg PO lasix daily. May have to cut back if CVP drops much more.  - Continue digoxin 0.125. Level 0.4 4/22 - Continue spiro 12.5 mg daily. - No ACE/ARB/b-blocker with shock - Echo reviewed personally with LVEF 20% moderate RV dysfunction   - We have been unable to wean inotropic support and even on fairly high-dose milrinone he remains very tenuous.  Pt will likely die soon if there is no durable option for him. Not transplant candidate due to limited support. VAD team now evaluating. RV tenuous but may be sufficient.  - Formal SW eval yesterday and pt determined to have enough support for post-VAD care - Planning for teeth extraction Thursday - LVAD next Tuesday. Will need IABP back in prior.    2. Atrial flutter: New onset. S/p DCCV to NSR on 4/12.  - Remains in NSR s/p DCCV.  - Continue amiodarone and heparin gtts for now pending possible IABP/VAD next week.  - INR 1.32. Off warfarin. Cont heparin  drip with potential surgery. No change.  - Previously off Xarelto due to [facility]  3. AKI CKD Stage II-III: Likely ATN due to shock - Creatinine 1.7 this am.   4. H/o LV Thrombus - now recurrent on echo 04/23/17: Off Xarelto due to previous SAH.  - Continue heparin. Warfarin on hold with possible surgery. No change.  5. DMII - Cover with SSI plus lantus.  - No change.   6. Hypothyroidism: TFTs OK 04/22/17.  - TSH elevated at 10.848 this am. (normal on 4/8).T4 and T3 normal.  7. RLE erythema/cellulitis - Started doxy 4/20. Improving.    Alford Highland, NP  05/09/2017 7:36 AM   Advanced Heart Failure Team Pager [phone] (M-F; 7a - 4p)  Please contact [facility] for night-coverage after hours (4p -7a ) and weekends on [email or web address]  Patient seen and examined with the above-signed Advanced Practice Provider and/or Housestaff. I personally reviewed laboratory data, imaging studies and relevant notes. I independently examined the patient and formulated the important aspects of the plan. I have edited the note to reflect any of my changes or salient points. I have personally discussed the plan with the patient and/or family.  Remains inotrope dependent. We reviewed results of VAD work-up and discussed the plan for the next week including teeth extraction tomorrow. Will continue on IV milrinone for hemodynamic support. Likely replace IABP on Monday.  He understands the risk of RV failure post-op. Continue heparin. Discussed dosing with PharmD personally. Continue po amio to maintain NSR.   [name], MD  9:42 PM

## 2017-05-09 NOTE — Progress Notes (Signed)
ANTICOAGULATION CONSULT NOTE  Pharmacy Consult for Heparin Indication: atrial fibrillation/LV thrombus   Allergies  Allergen Reactions  . Bee Venom     UNSPECIFIED REACTION    Patient Measurements: Height: 6' (182.9 cm) Weight: 202 lb 8 oz (91.9 kg) IBW/kg (Calculated) : 77.6  Heparin dosing weight: 99 kg   Vital Signs: Temp: 98.6 F (37 C) (04/24 1106) Temp Source: Oral (04/24 1106) BP: 99/70 (04/24 1106) Pulse Rate: 94 (04/24 1106)  Labs: Recent Labs    05/07/17 0531 05/07/17 1539 05/08/17 0542 05/09/17 0414  HGB 15.3  --  12.3* 12.0*  HCT 45.6  --  37.9* 37.4*  PLT 177  --  271 290  LABPROT 17.6*  --  16.7* 16.2*  INR 1.46  --  1.37 1.32  HEPARINUNFRC 0.22* 0.51 0.55 0.64  CREATININE 1.62*  --  1.81* 1.70*   Assessment: 68 yoM with h/o HF admitted with volume overload and in cardiogenic shock. Started on heparin gtt for afib with RVR. No AC PTA - previously on rivaroxaban but fell, hit his head, and developed [facility] 11/18. Last head CT showed no increase hemorrhage. ECHO 4/8 showed LVEF 20-25% and large LV thrombus.   Heparin level therapeutic this morning at 0.64, on 1550 units/hr. Hgb stable at 12, plts 290. No signs/symptoms of bleeding. No infusion issues documented.   Goal of Therapy:  Heparin level 0.3-0.7 units/ml INR goal: 2-3 Monitor platelets by anticoagulation protocol: Yes   Plan:  Continue heparin gtt at current rate Stop heparin gtt @ 0400 for dental extraction. Resume 12 hrs after case (request by dentist) @ 1550 units/hr.   Monitor daily HL, CBC, s/sx of bleeding  Continue to hold warfarin for plans for IABP/LVAD work-up and dental procedure     Belva Agee Student PharmD 05/09/2017 1:12 PM

## 2017-05-09 NOTE — Progress Notes (Signed)
1525 Cardiac Rehab Pt states that he has walked today several laps. He denies any problems walking and he plans to walk some more before bedtime.

## 2017-05-10 ENCOUNTER — Encounter (HOSPITAL_COMMUNITY)
Admission: EM | Disposition: A | Discharge status: Home Health | Payer: Self-pay | Source: Home / Self Care | Attending: Internal Medicine

## 2017-05-10 ENCOUNTER — Encounter (HOSPITAL_COMMUNITY): Payer: Self-pay

## 2017-05-10 ENCOUNTER — Inpatient Hospital Stay (HOSPITAL_COMMUNITY): Payer: Medicaid Other | Admitting: Anesthesiology

## 2017-05-10 DIAGNOSIS — K029 Dental caries, unspecified: Secondary | ICD-10-CM | POA: Diagnosis present

## 2017-05-10 DIAGNOSIS — K0889 Other specified disorders of teeth and supporting structures: Secondary | ICD-10-CM | POA: Diagnosis present

## 2017-05-10 DIAGNOSIS — Z01818 Encounter for other preprocedural examination: Secondary | ICD-10-CM

## 2017-05-10 DIAGNOSIS — I5022 Chronic systolic (congestive) heart failure: Secondary | ICD-10-CM

## 2017-05-10 DIAGNOSIS — M898X0 Other specified disorders of bone, multiple sites: Secondary | ICD-10-CM

## 2017-05-10 DIAGNOSIS — K045 Chronic apical periodontitis: Secondary | ICD-10-CM

## 2017-05-10 DIAGNOSIS — K0530 Chronic periodontitis, unspecified: Secondary | ICD-10-CM

## 2017-05-10 DIAGNOSIS — M278 Other specified diseases of jaws: Secondary | ICD-10-CM

## 2017-05-10 HISTORY — PX: MULTIPLE EXTRACTIONS WITH ALVEOLOPLASTY: SHX5342

## 2017-05-10 LAB — PROTIME-INR
INR: 1.2
Prothrombin Time: 15.1 seconds (ref 11.4–15.2)

## 2017-05-10 LAB — CBC
HCT: 36.4 % — ABNORMAL LOW (ref 39.0–52.0)
Hemoglobin: 11.7 g/dL — ABNORMAL LOW (ref 13.0–17.0)
MCH: 27.7 pg (ref 26.0–34.0)
MCHC: 32.1 g/dL (ref 30.0–36.0)
MCV: 86.3 fL (ref 78.0–100.0)
Platelets: 288 10*3/uL (ref 150–400)
RBC: 4.22 MIL/uL (ref 4.22–5.81)
RDW: 15.9 % — ABNORMAL HIGH (ref 11.5–15.5)
WBC: 6.5 10*3/uL (ref 4.0–10.5)

## 2017-05-10 LAB — COOXEMETRY PANEL
Carboxyhemoglobin: 1.4 % (ref 0.5–1.5)
Methemoglobin: 0.8 % (ref 0.0–1.5)
O2 Saturation: 56 %
Total hemoglobin: 12.3 g/dL (ref 12.0–16.0)

## 2017-05-10 LAB — BASIC METABOLIC PANEL
Anion gap: 8 (ref 5–15)
BUN: 34 mg/dL — ABNORMAL HIGH (ref 6–20)
CO2: 22 mmol/L (ref 22–32)
Calcium: 8.9 mg/dL (ref 8.9–10.3)
Chloride: 103 mmol/L (ref 101–111)
Creatinine, Ser: 1.7 mg/dL — ABNORMAL HIGH (ref 0.61–1.24)
GFR calc Af Amer: 53 mL/min — ABNORMAL LOW (ref >60)
GFR calc non Af Amer: 46 mL/min — ABNORMAL LOW (ref >60)
Glucose, Bld: 136 mg/dL — ABNORMAL HIGH (ref 65–99)
Potassium: 4.1 mmol/L (ref 3.5–5.1)
Sodium: 133 mmol/L — ABNORMAL LOW (ref 135–145)

## 2017-05-10 LAB — GLUCOSE, CAPILLARY
Glucose-Capillary: 110 mg/dL — ABNORMAL HIGH (ref 65–99)
Glucose-Capillary: 149 mg/dL — ABNORMAL HIGH (ref 65–99)
Glucose-Capillary: 194 mg/dL — ABNORMAL HIGH (ref 65–99)
Glucose-Capillary: 250 mg/dL — ABNORMAL HIGH (ref 65–99)

## 2017-05-10 LAB — MAGNESIUM: Magnesium: 1.9 mg/dL (ref 1.7–2.4)

## 2017-05-10 SURGERY — MULTIPLE EXTRACTION WITH ALVEOLOPLASTY
Anesthesia: General

## 2017-05-10 MED ORDER — ROCURONIUM BROMIDE 10 MG/ML (PF) SYRINGE
PREFILLED_SYRINGE | INTRAVENOUS | Status: DC | PRN
  Administered 2017-05-10: 50 mg via INTRAVENOUS

## 2017-05-10 MED ORDER — PHENYLEPHRINE 40 MCG/ML (10ML) SYRINGE FOR IV PUSH (FOR BLOOD PRESSURE SUPPORT)
PREFILLED_SYRINGE | INTRAVENOUS | Status: DC | PRN
  Administered 2017-05-10 (×5): 80 ug via INTRAVENOUS

## 2017-05-10 MED ORDER — BUPIVACAINE-EPINEPHRINE 0.5% -1:200000 IJ SOLN
INTRAMUSCULAR | Status: DC | PRN
  Administered 2017-05-10: 3.4 mL

## 2017-05-10 MED ORDER — ONDANSETRON HCL 4 MG/2ML IJ SOLN
INTRAMUSCULAR | Status: AC
  Filled 2017-05-10: qty 2

## 2017-05-10 MED ORDER — ETOMIDATE 2 MG/ML IV SOLN
INTRAVENOUS | Status: DC | PRN
  Administered 2017-05-10: 18 mg via INTRAVENOUS

## 2017-05-10 MED ORDER — OXYCODONE-ACETAMINOPHEN 5-325 MG PO TABS
1.0000 | ORAL_TABLET | Freq: Four times a day (QID) | ORAL | Status: DC | PRN
  Administered 2017-05-10: 1 via ORAL
  Filled 2017-05-10: qty 1

## 2017-05-10 MED ORDER — DEXAMETHASONE SODIUM PHOSPHATE 10 MG/ML IJ SOLN
INTRAMUSCULAR | Status: AC
  Filled 2017-05-10: qty 1

## 2017-05-10 MED ORDER — PHENYLEPHRINE 40 MCG/ML (10ML) SYRINGE FOR IV PUSH (FOR BLOOD PRESSURE SUPPORT)
PREFILLED_SYRINGE | INTRAVENOUS | Status: AC
  Filled 2017-05-10: qty 20

## 2017-05-10 MED ORDER — BUPIVACAINE-EPINEPHRINE (PF) 0.5% -1:200000 IJ SOLN
INTRAMUSCULAR | Status: AC
  Filled 2017-05-10: qty 3.6

## 2017-05-10 MED ORDER — EPHEDRINE 5 MG/ML INJ
INTRAVENOUS | Status: AC
  Filled 2017-05-10: qty 20

## 2017-05-10 MED ORDER — ONDANSETRON HCL 4 MG/2ML IJ SOLN: 4.0000 mg | Freq: Once | INTRAMUSCULAR | Status: DC | PRN

## 2017-05-10 MED ORDER — SUGAMMADEX SODIUM 200 MG/2ML IV SOLN
INTRAVENOUS | Status: AC
  Filled 2017-05-10: qty 2

## 2017-05-10 MED ORDER — DEXAMETHASONE SODIUM PHOSPHATE 10 MG/ML IJ SOLN
INTRAMUSCULAR | Status: DC | PRN
  Administered 2017-05-10: 4 mg via INTRAVENOUS

## 2017-05-10 MED ORDER — MIDAZOLAM HCL 2 MG/2ML IJ SOLN
INTRAMUSCULAR | Status: AC
  Filled 2017-05-10: qty 2

## 2017-05-10 MED ORDER — FENTANYL CITRATE (PF) 250 MCG/5ML IJ SOLN
INTRAMUSCULAR | Status: AC
  Filled 2017-05-10: qty 5

## 2017-05-10 MED ORDER — FENTANYL CITRATE (PF) 100 MCG/2ML IJ SOLN: 25.0000 ug | INTRAMUSCULAR | Status: DC | PRN

## 2017-05-10 MED ORDER — MIDAZOLAM HCL 5 MG/5ML IJ SOLN
INTRAMUSCULAR | Status: DC | PRN
  Administered 2017-05-10 (×2): 1 mg via INTRAVENOUS

## 2017-05-10 MED ORDER — OXYMETAZOLINE HCL 0.05 % NA SOLN
NASAL | Status: DC | PRN
  Administered 2017-05-10 (×2): 2 via NASAL

## 2017-05-10 MED ORDER — PHENYLEPHRINE HCL 10 MG/ML IJ SOLN
INTRAMUSCULAR | Status: DC | PRN
  Administered 2017-05-10: 20 ug/min via INTRAVENOUS

## 2017-05-10 MED ORDER — ONDANSETRON HCL 4 MG/2ML IJ SOLN
INTRAMUSCULAR | Status: DC | PRN
  Administered 2017-05-10: 4 mg via INTRAVENOUS

## 2017-05-10 MED ORDER — LIDOCAINE-EPINEPHRINE 2 %-1:100000 IJ SOLN
INTRAMUSCULAR | Status: DC | PRN
  Administered 2017-05-10: 10.2 mL via INTRADERMAL

## 2017-05-10 MED ORDER — LIDOCAINE 2% (20 MG/ML) 5 ML SYRINGE
INTRAMUSCULAR | Status: AC
  Filled 2017-05-10: qty 5

## 2017-05-10 MED ORDER — FENTANYL CITRATE (PF) 250 MCG/5ML IJ SOLN
INTRAMUSCULAR | Status: DC | PRN
  Administered 2017-05-10 (×3): 50 ug via INTRAVENOUS

## 2017-05-10 MED ORDER — SUGAMMADEX SODIUM 200 MG/2ML IV SOLN
INTRAVENOUS | Status: DC | PRN
  Administered 2017-05-10: 183.8 mg via INTRAVENOUS

## 2017-05-10 MED ORDER — HEPARIN (PORCINE) IN NACL 100-0.45 UNIT/ML-% IJ SOLN
1550.0000 [IU]/h | INTRAMUSCULAR | Status: DC
  Administered 2017-05-11 – 2017-05-12 (×3): 1550 [IU]/h via INTRAVENOUS
  Filled 2017-05-10 (×6): qty 250

## 2017-05-10 MED ORDER — AMINOCAPROIC ACID SOLUTION 5% (50 MG/ML)
10.0000 mL | ORAL | Status: AC
  Administered 2017-05-10 (×3): 10 mL via ORAL
  Administered 2017-05-10: 100 mL via ORAL
  Administered 2017-05-10 (×3): 10 mL via ORAL
  Filled 2017-05-10 (×2): qty 100

## 2017-05-10 MED ORDER — LIDOCAINE 2% (20 MG/ML) 5 ML SYRINGE
INTRAMUSCULAR | Status: DC | PRN
  Administered 2017-05-10: 60 mg via INTRAVENOUS

## 2017-05-10 MED ORDER — JUVEN PO PACK
1.0000 | PACK | Freq: Two times a day (BID) | ORAL | Status: DC
  Administered 2017-05-10 – 2017-05-17 (×7): 1 via ORAL
  Filled 2017-05-10 (×16): qty 1

## 2017-05-10 MED ORDER — LIDOCAINE-EPINEPHRINE 2 %-1:100000 IJ SOLN
INTRAMUSCULAR | Status: AC
  Filled 2017-05-10: qty 10.2

## 2017-05-10 MED ORDER — ROCURONIUM BROMIDE 10 MG/ML (PF) SYRINGE
PREFILLED_SYRINGE | INTRAVENOUS | Status: AC
  Filled 2017-05-10: qty 5

## 2017-05-10 MED ORDER — 0.9 % SODIUM CHLORIDE (POUR BTL) OPTIME
TOPICAL | Status: DC | PRN
  Administered 2017-05-10: 1000 mL

## 2017-05-10 MED ORDER — HEMOSTATIC AGENTS (NO CHARGE) OPTIME
TOPICAL | Status: DC | PRN
  Administered 2017-05-10 (×2): 1 via TOPICAL

## 2017-05-10 MED ORDER — LACTATED RINGERS IV SOLN
INTRAVENOUS | Status: DC
  Administered 2017-05-10: 09:00:00 via INTRAVENOUS

## 2017-05-10 MED ORDER — MORPHINE SULFATE (PF) 2 MG/ML IV SOLN: 2.0000 mg | INTRAVENOUS | Status: DC | PRN

## 2017-05-10 MED ORDER — LACTATED RINGERS IV SOLN: INTRAVENOUS | Status: DC

## 2017-05-10 SURGICAL SUPPLY — 40 items
ALCOHOL 70% 16 OZ (MISCELLANEOUS) ×3 IMPLANT
ATTRACTOMAT 16X20 MAGNETIC DRP (DRAPES) ×3 IMPLANT
BLADE SURG 15 STRL LF DISP TIS (BLADE) ×2 IMPLANT
BLADE SURG 15 STRL SS (BLADE) ×6
COVER SURGICAL LIGHT HANDLE (MISCELLANEOUS) ×3 IMPLANT
GAUZE PACKING FOLDED 2  STR (GAUZE/BANDAGES/DRESSINGS) ×2
GAUZE PACKING FOLDED 2 STR (GAUZE/BANDAGES/DRESSINGS) ×1 IMPLANT
GAUZE SPONGE 4X4 16PLY XRAY LF (GAUZE/BANDAGES/DRESSINGS) ×3 IMPLANT
GLOVE BIO SURGEON STRL SZ 6.5 (GLOVE) ×2 IMPLANT
GLOVE BIO SURGEONS STRL SZ 6.5 (GLOVE) ×1
GLOVE SURG ORTHO 8.0 STRL STRW (GLOVE) ×3 IMPLANT
GOWN STRL REUS W/ TWL LRG LVL3 (GOWN DISPOSABLE) ×1 IMPLANT
GOWN STRL REUS W/TWL 2XL LVL3 (GOWN DISPOSABLE) ×3 IMPLANT
GOWN STRL REUS W/TWL LRG LVL3 (GOWN DISPOSABLE) ×3
HEMOSTAT SURGICEL 2X14 (HEMOSTASIS) ×3 IMPLANT
KIT BASIN OR (CUSTOM PROCEDURE TRAY) ×3 IMPLANT
KIT TURNOVER KIT B (KITS) ×3 IMPLANT
MANIFOLD NEPTUNE WASTE (CANNULA) ×3 IMPLANT
NDL BLUNT 16X1.5 OR ONLY (NEEDLE) ×1 IMPLANT
NDL DENTAL 27 LONG (NEEDLE) ×2 IMPLANT
NEEDLE BLUNT 16X1.5 OR ONLY (NEEDLE) ×3 IMPLANT
NEEDLE DENTAL 27 LONG (NEEDLE) ×6 IMPLANT
NS IRRIG 1000ML POUR BTL (IV SOLUTION) ×3 IMPLANT
PACK EENT II TURBAN DRAPE (CUSTOM PROCEDURE TRAY) ×3 IMPLANT
PAD ARMBOARD 7.5X6 YLW CONV (MISCELLANEOUS) ×3 IMPLANT
SPONGE LAP 4X18 X RAY DECT (DISPOSABLE) ×2 IMPLANT
SPONGE SURGIFOAM ABS GEL 100 (HEMOSTASIS) IMPLANT
SPONGE SURGIFOAM ABS GEL 12-7 (HEMOSTASIS) IMPLANT
SPONGE SURGIFOAM ABS GEL SZ50 (HEMOSTASIS) ×2 IMPLANT
SUCTION FRAZIER HANDLE 10FR (MISCELLANEOUS) ×2
SUCTION TUBE FRAZIER 10FR DISP (MISCELLANEOUS) ×1 IMPLANT
SUT CHROMIC 3 0 PS 2 (SUTURE) ×10 IMPLANT
SUT CHROMIC 4 0 P 3 18 (SUTURE) IMPLANT
SYR 50ML SLIP (SYRINGE) ×3 IMPLANT
TOWEL NATURAL 10PK STERILE (DISPOSABLE) ×3 IMPLANT
TUBE CONNECTING 12'X1/4 (SUCTIONS) ×1
TUBE CONNECTING 12X1/4 (SUCTIONS) ×2 IMPLANT
WATER STERILE IRR 1000ML POUR (IV SOLUTION) ×3 IMPLANT
WATER TABLETS ICX (MISCELLANEOUS) ×3 IMPLANT
YANKAUER SUCT BULB TIP NO VENT (SUCTIONS) ×3 IMPLANT

## 2017-05-10 NOTE — Transfer of Care (Signed)
Immediate Anesthesia Transfer of Care Note  Patient: Benjamin Sherman  Procedure(s) Performed: Extraction of 2-6, 8-15, 17, 19-24, 27- 30, and 32 with alveoloplasty, and maxillary and mandibular lateral exotoses reductions. (N/A )  Patient Location: PACU  Anesthesia Type:General  Level of Consciousness: awake, oriented, drowsy and patient cooperative  Airway & Oxygen Therapy: Patient Spontanous Breathing and Patient connected to face mask oxygen  Post-op Assessment: Report given to RN and Post -op Vital signs reviewed and stable  Post vital signs: Reviewed and stable  Last Vitals:  Vitals Value Taken Time  BP 127/91 05/10/2017 12:36 PM  Temp 36.3 C 05/10/2017 12:38 PM  Pulse 104 05/10/2017 12:39 PM  Resp 16 05/10/2017 12:39 PM  SpO2 99 % 05/10/2017 12:39 PM  Vitals shown include unvalidated device data.  Last Pain:  Vitals:   05/10/17 0415  TempSrc: Oral  PainSc:       Patients Stated Pain Goal: 0 (05/09/17 2200)  Complications: No apparent anesthesia complications

## 2017-05-10 NOTE — Op Note (Signed)
OPERATIVE REPORT  Patient:            Benjamin Sherman Date of Birth:  [birth year] MRN:                [id number]   DATE OF PROCEDURE:  05/10/2017  PREOPERATIVE DIAGNOSES: 1.  Heart failure and nonischemic cardiomyopathy 2.  Pre-LVAD procedure dental protocol 3.  Chronic apical periodontitis 4.  Chronic periodontitis 5.  Tooth mobility 6.  Dental caries  POSTOPERATIVE DIAGNOSES: 1.  Heart failure and nonischemic cardiomyopathy 2.  Pre-LVAD procedure dental protocol 3.  Chronic apical periodontitis 4.  Chronic periodontitis 5.  Tooth mobility 6.  Dental caries 7.  Maxillary and mandibular lateral exostoses  OPERATIONS: 1. Multiple extraction of tooth numbers 2- 6, 8 -15, 17, 19 -24, 27-30, and 32 2. 4 Quadrants of alveoloplasty 3.  Maxillary and mandibular lateral exostoses reductions   SURGEON: Charlynne Pander, DDS  ASSISTANT: Pearletha Alfred (dental assistant)  ANESTHESIA: General anesthesia via nasoendotracheal tube.  MEDICATIONS: 1. Ancef 2 g IV prior to invasive dental procedures. 2. Local anesthesia with a total utilization of 6 carpules each containing 34 mg of lidocaine with 0.017 mg of epinephrine as well as 2 carpules each containing 9 mg of bupivacaine with 0.009 mg of epinephrine.  SPECIMENS: There are 25 teeth that were discarded.  DRAINS: None  CULTURES: None  COMPLICATIONS: None  ESTIMATED BLOOD LOSS: 150 mLs.  INTRAVENOUS FLUIDS: 500 mLs of Lactated ringers solution.  INDICATIONS: The patient was recently diagnosed with heart failure and nonischemic cardiomyopathy.  A medically necessary dental consultation was then requested to evaluate poor dentition prior to LVAD placement..  The patient was examined and treatment planned for extraction of remaining teeth with alveoloplasty and pre-prosthetic surgery as needed in the operating room with general anesthesia.  This treatment plan was formulated to decrease the risks and complications associated  with dental infection from affecting the patient's systemic health and the anticipated LVAD procedure.  OPERATIVE FINDINGS: Patient was examined operating room number 10.  The teeth were identified for extraction. The patient was noted be affected by chronic periodontitis, chronic apical periodontitis, dental caries, and loose teeth.  The patient was also found to have maxillary and mandibular buccal, lingual, and palatal lateral exostoses during the surgical procedure that would be reduced during the surgical procedure.  DESCRIPTION OF PROCEDURE: Patient was brought to the main operating room number 10. Patient was then placed in the supine position on the operating table. General anesthesia was then induced per the anesthesia team. The patient was then prepped and draped in the usual manner for dental medicine procedure. A timeout was performed. The patient was identified and procedures were verified. A throat pack was placed at this time. The oral cavity was then thoroughly examined with the findings noted above. The patient was then ready for the dental medicine procedure as follows:  Local anesthesia was then administered sequentially with a total utilization of 6 carpules each containing 34 mg of lidocaine with 0.017 mg of epinephrine as well as 2 carpules  each containing 9 mg bupivacaine with 0.009 mg of epinephrine.  The Maxillary left and right quadrants first approached. Anesthesia was then delivered utilizing infiltration with lidocaine with epinephrine. A #15 blade incision was then made from the maxillary right tuberosity and extended to the maxillary left tuberosity.  A  surgical flap was then carefully reflected. The maxillary teeth were then subluxated with a series of straight elevators. Tooth numbers 2, 3 were removed  with a 53R forceps without complications.  Tooth numbers 14 and 15 were removed with a 53L forceps without complications.  Tooth numbers 4, 5, 6, 8, 9, 10, 11, 12, 13 were  then removed with a 150 forceps without complications.  Alveoloplasty was then performed utilizing a ronguers and bone file.  At this point time maxillary right and maxillary left posterior quadrant buccal and palatal exostoses were noted and were reduced utilizing a rongeurs and bone file.  Further alveoloplasty was then performed utilizing a ronguers and bone file.  The tissues were then approximated and trimmed appropriately.  The surgical sites were then irrigated with copious amounts sterile saline x4.  A piece of Surgicel was then placed in each extraction socket appropriately.  The maxillary right surgical site was then closed from the maxillary right tuberosity and extended the mesial #8 utilizing 3-0 chromic gut suture in a continuous interrupted suture technique x1.  The maxillary left surgical site was then closed from the maxillary left tuberosity and extended to the mesial #9 utilizing 3-0 chromic gut suture in a continuous interrupted suture technique x1.    At this point time, the mandibular quadrants were approached. The patient was given bilateral inferior alveolar nerve blocks and long buccal nerve blocks utilizing the bupivacaine with epinephrine. Further infiltration was then achieved utilizing the lidocaine with epinephrine. A 15 blade incision was then made from the distal of number 17 and extended to the distal of #32.  A surgical flap was then carefully reflected.  The lower teeth were then subluxated with a series of straight elevators.  A surgical handpiece and bur and copious amounts sterile water were then used to remove buccal and interseptal bone around tooth numbers 17, 19, 20, 21, 22, 27, 28, 29, 30, and 32.  These teeth were then re-subluxated with a series of straight elevators.  Tooth numbers 17, 19, 30, and 32 were then removed with a 17 forceps without complications.  Tooth numbers 20 through 24, and 27 through 29 were then removed with a 151 forceps without complications.   Alveoloplasty was then performed utilizing a rongeurs and bone file.  At this point time, bilateral posterior mandibular lingual lateral exostoses in the area of #18-22 and #31-27 were then reduced utilizing a surgical handpiece and bur and copious amounts of sterile water.  Alveoloplasty was then again performed utilizing a ronguers and bone file.  The tissues were approximated and trimmed appropriately.  The surgical site was then irrigated with copious amounts of sterile saline x4.  A piece of Surgicel was placed in each extraction socket appropriately.  The mandibular left surgical site was then closed from the distal of #17 extended to the mesial #24 utilizing 3-0 chromic gut suture in a continuous interrupted suture technique x1.  The mandibular right surgical site was then closed from the distal of #32 and extended the mesial of #25 utilizing 3-0 chromic gut suture in a continuous interrupted suture technique x1.    At this point in time, the entire mouth was irrigated with copious amounts of sterile saline. The patient was examined for complications, seeing none, the dental medicine procedure was deemed to be complete. The throat pack was removed at this time. An oral airway was then placed at the request of the anesthesia team. A series of 4 x 4 gauze moistened with Amicar 5% rinse were placed in the mouth to aid hemostasis. The patient was then handed over to the anesthesia team for final disposition. After an  appropriate amount of time, the patient was extubated and taken to the postanesthsia care unit in good condition. All counts were correct for the dental medicine procedure.  The patient is to continue the Amicar 5% rinses postoperatively.  Patient is to rinse with 10 mL's every hour for the next 10 hours in a swish and spit manner.  The heparin therapy is to be discontinued until Friday morning, 05/11/2017 at 8 AM and then can be restarted by the pharmacy team with no bolus.  The patient will then  proceed with the LVAD placement at the discretion of Dr. Donata Clay.   Charlynne Pander, DDS.

## 2017-05-10 NOTE — Anesthesia Preprocedure Evaluation (Addendum)
Anesthesia Evaluation  Patient identified by MRN, date of birth, ID band Patient awake  General Assessment Comment:Admitted 04/21/17 with cardiogenic shock and atrial flutter  Reviewed: Allergy & Precautions, NPO status , Patient's Chart, lab work & pertinent test results  Airway Mallampati: II  TM Distance: >3 FB Neck ROM: Full    Dental  (+) Dental Advisory Given, Poor Dentition, Loose,    Pulmonary former smoker,    Pulmonary exam normal breath sounds clear to auscultation       Cardiovascular hypertension, Pt. on medications (-) angina+CHF  (-) Past MI Normal cardiovascular exam Rhythm:Regular Rate:Normal  Echo 4/8: LVEF 20-25% with LV apical clot. RV mild to moderately down.  Repeat echo 4/19 unchanged RV moderately down      Neuro/Psych negative neurological ROS  negative psych ROS   GI/Hepatic negative GI ROS, Neg liver ROS,   Endo/Other  diabetes, Type 2, Insulin DependentHypothyroidism   Renal/GU Renal InsufficiencyRenal disease     Musculoskeletal negative musculoskeletal ROS (+)   Abdominal   Peds  Hematology  (+) Blood dyscrasia, anemia ,   Anesthesia Other Findings Day of surgery medications reviewed with the patient.  Chronic periodontitis   Reproductive/Obstetrics                            Anesthesia Physical Anesthesia Plan  ASA: IV  Anesthesia Plan: General   Post-op Pain Management:    Induction: Intravenous  PONV Risk Score and Plan: 2 and Dexamethasone, Ondansetron and Midazolam  Airway Management Planned: Nasal ETT  Additional Equipment: Arterial line  Intra-op Plan:   Post-operative Plan: Extubation in OR  Informed Consent: I have reviewed the patients History and Physical, chart, labs and discussed the procedure including the risks, benefits and alternatives for the proposed anesthesia with the patient or authorized representative who has indicated  his/her understanding and acceptance.   Dental advisory given  Plan Discussed with: CRNA  Anesthesia Plan Comments:         Anesthesia Quick Evaluation

## 2017-05-10 NOTE — Plan of Care (Signed)
  Problem: Activity: Goal: Ability to tolerate increased activity will improve Outcome: Progressing Note: Ambulates in room independently without difficulty.   

## 2017-05-10 NOTE — Progress Notes (Signed)
Nutrition Follow-up  DOCUMENTATION CODES:   Not applicable  INTERVENTION:   -D/c Glucerna Shake po q HS, each supplement provides 220 kcal and 10 grams of protein -Continue 30 ml Prostat BID, each supplement provides 100 kcals and 15 grams protein  NUTRITION DIAGNOSIS:   Increased nutrient needs related to catabolic illness(End stage HF) as evidenced by estimated needs.  Ongoing  GOAL:   Patient will meet greater than or equal to 90% of their needs  Progressing  MONITOR:   PO intake, Supplement acceptance, Labs, Weight trends, I & O's  REASON FOR ASSESSMENT:   Consult Assessment of nutrition requirement/status  ASSESSMENT:   47 y/o make PMHx CHF, DM2, SAH, CKD2-3. Patient had been diagnosed with HF (ef 15%) end of last year. Presented to [facility] 4/6 w/ SOB and BLE edema x 2.5 days.  Worked up for Cardiogenic shock in setting of decompensated, worsening heart failure. He is not a transplant candidate and poor candidate for LVAD due to social situation & RV dysfunction. VAD team evaluating. Options are hospice vs high risk VAD. RD consulted for nutrition evaluation for potential LVAD.   4/25- s/p multiple extraction of tooth numbers 2- 6, 8 -15, 17, 19 -24, 27-30, and 32; 4 Quadrants of alveoloplasty; Maxillary and mandibular lateral exostoses reductions  Reviewed I/O's: +271 ml x 24 hours and -12.7 L since 04/26/17  Pt very drowsy at time of visit; difficult to engage due to somnolence and gauze in mouth. Pt just returned from dental surgery.   Prior to surgery, pt had a great appetite. Noted meal completion 75-100%. Pt also taking Glucerna and Prostat supplements per MAR.   Pt currently on a clear liquid diet; noted pt consumed <25% of clear liquid diet.   Medications reviewed and include prednisone.   Per advanced heart failure team notes, plan for upcoming LVAD.   Last Hgb A1c: 7.7 (05/08/17). Per PTA medications, pt was not in DM medications PTA.   Labs reviewed: Na:  133, CBGS: 110-201 (inpatient orders for glycemic control are 0-15 units insulin aspart TID with meals, 0-5 units insulin aspart q HS, and 10 units insulin glargine q HS).   Diet Order:  Diet clear liquid Room service appropriate? No; Fluid consistency: Thin  EDUCATION NEEDS:   Education needs have been addressed  Skin:  Skin Assessment: Reviewed RN Assessment  Last BM:  05/08/17  Height:   Ht Readings from Last 1 Encounters:  04/21/17 6' (1.829 m)    Weight:   Wt Readings from Last 1 Encounters:  05/10/17 202 lb 11.2 oz (91.9 kg)    Ideal Body Weight:  80.91 kg  BMI:  Body mass index is 27.49 kg/m.  Estimated Nutritional Needs:   Kcal:  2200-2400 kcals (24-26 kcal/kg bw)  Protein:  110-130g Pro (1.2-1.4 g/kg bw)  Fluid:  <1.5 L (MD ordered Fluid restriction)    Coltin Casher A. Mayford Knife, RD, LDN, CDE Pager: [phone] After hours Pager: [phone]

## 2017-05-10 NOTE — Plan of Care (Signed)
  Problem: Safety: Goal: Ability to remain free from injury will improve Outcome: Completed/Met

## 2017-05-10 NOTE — Progress Notes (Addendum)
Patient ID: Benjamin Sherman, male   DOB: [birth year], 47 y.o.   MRN: [id number]     Advanced Heart Failure Rounding Note  Subjective:    Admitted 04/21/17 with cardiogenic shock and atrial flutter.   Central line placed. Initial co-ox 39%.  IABP placed 04/25/17-> 04/30/17 with persistent cardiogenic shock.   DCCV atrial flutter 4/12.  Maintaining NSR on amio drip.  Coox 56% this am on Milrinone 0.375 mcg/kg/min. Modest UOP on PO lasix. Weight unchanged. CVP 4-5  S/p multiple teeth extractions today.  Seated in bed currently. Pain well controlled. Denies SOB, orthopnea or PND  Erythema improving on ankle.   Planning for LVAD Tuesday. Will need IABP Monday prior to VAD on Tuesday.   Echo 4/8: LVEF 20-25% with LV apical clot. RV mild to moderately down.  Repeat echo 4/19 unchanged RV moderately down   Creatinine 2.02 => 1.73=> 1.7 => 1.85 => 1.93=> 1.92 => 1.7 => 1.8 => 1.6 => 1.8 => 1.7 => 1.7  Objective:   Weight Range:  Vital Signs:   Temp:  [97.8 F (36.6 C)-98.6 F (37 C)] 97.8 F (36.6 C) (04/25 0415) Pulse Rate:  [89-96] 93 (04/25 0415) Resp:  [16-18] 18 (04/24 1700) BP: (92-115)/(66-72) 115/72 (04/25 0415) SpO2:  [97 %-100 %] 99 % (04/25 0415) Weight:  [202 lb 11.2 oz (91.9 kg)] 202 lb 11.2 oz (91.9 kg) (04/25 0415) Last BM Date: 05/08/17  Weight change: Filed Weights   05/08/17 0444 05/09/17 0437 05/10/17 0415  Weight: 203 lb 4.8 oz (92.2 kg) 202 lb 8 oz (91.9 kg) 202 lb 11.2 oz (91.9 kg)   Intake/Output:   Intake/Output Summary (Last 24 hours) at 05/10/2017 0738 Last data filed at 05/10/2017 0370 Gross per 24 hour  Intake 1671.04 ml  Output 1400 ml  Net 271.04 ml    Physical Exam   CVP: 4-5  General: Fatigued. No resp difficulty. HEENT: Edentulous. Mouth packed with gauze s/p extractions. Neck: Supple. JVP Not elevated. Carotids 2+ bilat; no bruits. No thyromegaly or nodule noted. Cor: PMI laterally displaced. RRR, +S3 Lungs: CTAB, normal effort. No  wheeze Abdomen: Soft, non-tender, non-distended, no HSM. No bruits or masses. +BS  Extremities: No cyanosis, clubbing, or rash. R and LLE no edema. Rt ankle with mild erythema and edema. Warm extremities Neuro: alert & oriented x 3, cranial nerves grossly intact. moves all 4 extremities w/o difficulty. Affect pleasant   Telemetry    SR 90s. Personally reviewed.  EKG   No new tracings.   Labs    Basic Metabolic Panel: Recent Labs  Lab 05/06/17 0454 05/07/17 0531 05/08/17 0542 05/09/17 0414 05/10/17 0544  NA 132* 132* 133* 136 133*  K 3.9 3.8 4.1 4.1 4.1  CL 99* 99* 101 100* 103  CO2 24 24 23 27 22   GLUCOSE 123* 112* 114* 89 136*  BUN 28* 29* 30* 28* 34*  CREATININE 1.85* 1.62* 1.81* 1.70* 1.70*  CALCIUM 8.8* 8.8* 8.7* 9.0 8.9  MG 1.8 1.8 1.8 2.2 1.9   Liver Function Tests: Recent Labs  Lab 05/08/17 0542  AST 24  ALT 19  ALKPHOS 81  BILITOT 0.9  PROT 7.6  ALBUMIN 2.5*   No results for input(s): LIPASE, AMYLASE in the last 168 hours. No results for input(s): AMMONIA in the last 168 hours.  CBC: Recent Labs  Lab 05/06/17 0454 05/07/17 0531 05/08/17 0542 05/09/17 0414 05/10/17 0544  WBC 6.5 3.9* 6.1 5.7 6.5  HGB 11.9* 15.3 12.3* 12.0* 11.7*  HCT 36.5*  45.6 37.9* 37.4* 36.4*  MCV 85.5 85.1 86.1 86.4 86.3  PLT 229 177 271 290 288   Cardiac Enzymes: No results for input(s): CKTOTAL, CKMB, CKMBINDEX, TROPONINI in the last 168 hours. BNP: BNP (last 3 results) Recent Labs    11/06/16 1621 03/25/17 1401 04/21/17 1212  BNP 961.6* 835.4* 483.0*   ProBNP (last 3 results) No results for input(s): PROBNP in the last 8760 hours.  Other results:  Imaging: [place] Ekg Site Rite  Result Date: 05/09/2017 If Site Rite image not attached, placement could not be confirmed due to current cardiac rhythm.   Medications:    Scheduled Medications: . amiodarone  200 mg Oral BID  . Chlorhexidine Gluconate Cloth  6 each Topical Daily  . Chlorhexidine Gluconate  Cloth  6 each Topical Daily  . digoxin  0.125 mg Oral Daily  . doxycycline  100 mg Oral Q12H  . feeding supplement (GLUCERNA SHAKE)  237 mL Oral Q24H  . feeding supplement (PRO-STAT SUGAR FREE 64)  30 mL Oral BID  . furosemide  80 mg Oral Daily  . insulin aspart  0-15 Units Subcutaneous TID WC  . insulin aspart  0-5 Units Subcutaneous QHS  . insulin glargine  10 Units Subcutaneous QHS  . mupirocin ointment  1 application Nasal BID  . predniSONE  40 mg Oral Q breakfast  . sodium chloride flush  10-40 mL Intracatheter Q12H  . sodium chloride flush  10-40 mL Intracatheter Q12H  . sodium chloride flush  3 mL Intravenous Q12H  . spironolactone  12.5 mg Oral Daily    Infusions: . sodium chloride Stopped (05/01/17 1001)  . sodium chloride Stopped (05/01/17 0800)  . sodium chloride    .  ceFAZolin (ANCEF) IV    . milrinone 0.375 mcg/kg/min (05/10/17 0400)    PRN Medications:    Assessment:   Anthonymichael Sherman is a 47 y.o. 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 eosinophilic cardiomyopathy, LV thrombus, hypothyroidism, DM2, CKD Stage II-III and traumatic [facility] 11/18 after fall.   Admitted with cardiogenic shock.  Plan/Discussion:    1. Acute on chronic systolic HF -> cardiogenic shock: ECHO 11/2016 EF 25-30% CMRI EF 22%. Bedside echo in HF clinic EF 20% in 3/19.  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 Did not respond to steroids. Admitted 4/6 with cardiogenic shock and volume overload co-ox 38%. Echo 04/23/17: LVEF 20-25% with large LV clot, RV mild to moderately down. - IABP out. Swan pulled 05/01/17. - Coox 56% this am on milrinone 0.375 mcg/kg/min.  - If coox drops, will move back to unit for levophed and possible IABP to VAD.  - Volume stable on exam.  CVP ~4-5 - Continue 80 mg lasix po. May need to hold in next day or two with decreased intake s/p teeth extraction.  - Continue digoxin 0.125. Level 0.4 4/22 - Continue spiro 12.5 mg  daily. - No ACE/ARB/b-blocker with shock - Echo reviewed personally with LVEF 20% moderate RV dysfunction   - We have been unable to wean inotropic support and even on fairly high-dose milrinone he remains very tenuous. Pt will likely die soon if there is no durable option for him. Not transplant candidate due to limited support. RV tenuous but may be sufficient.  - Plan for LVAD on Tuesday. He will need IABP back in on Monday prior to surgery.  2. Atrial flutter: New onset. S/p DCCV to NSR on 4/12.  - Remains in NSR s/p DCCV.  -  Continue amiodarone and heparin gtts for now pending possible IABP/VAD next week.  - INR 1.20. Off warfarin. Cont heparin drip with surgery next week.  - Previously off Xarelto due to [facility].  3. AKI CKD Stage II-III: Likely ATN due to shock - Creatinine 1.7 4. H/o LV Thrombus - now recurrent on echo 04/23/17: Off Xarelto due to previous SAH.  - Continue heparin. Warfarin on hold with surgery next week.  5. DMII - Cover with SSI plus lantus.  - No change.   6. Hypothyroidism: TFTs OK 04/22/17.  - TSH elevated at 10.848 this am. (normal on 4/8).T4 and T3 normal. No change.  7. RLE erythema/cellulitis - Started doxy 4/20. Improving.    - Day 2/3 of 40 mg prednisone for gout flare. Uric acid 11.5  Luane School  05/10/2017 7:38 AM   Advanced Heart Failure Team Pager [phone] (M-F; 7a - 4p)  Please contact [facility] for night-coverage after hours (4p -7a ) and weekends on [email or web address]  Patient seen and examined with the above-signed Advanced Practice Provider and/or Housestaff. I personally reviewed laboratory data, imaging studies and relevant notes. I independently examined the patient and formulated the important aspects of the plan. I have edited the note to reflect any of my changes or salient points. I have personally discussed the plan with the patient and/or family.  He is s/p multiple teeth extractions. Remains on milrinone. Co-ox and volume  status ok. Creatinine stable at 1.7. Hold heparin overnight.   [name], MD  4:41 PM

## 2017-05-10 NOTE — Anesthesia Procedure Notes (Signed)
Arterial Line Insertion Start/End4/25/2019 9:15 AM, 05/10/2017 9:18 AM Performed by: Julian Reil, CRNA, CRNA  Patient location: Pre-op. Preanesthetic checklist: patient identified, IV checked, site marked, risks and benefits discussed, surgical consent, monitors and equipment checked, pre-op evaluation and timeout performed Lidocaine 1% used for infiltration Left, radial was placed Catheter size: 20 G Hand hygiene performed  and maximum sterile barriers used   Attempts: 1 Procedure performed without using ultrasound guided technique. Following insertion, dressing applied and Biopatch. Post procedure assessment: normal  Patient tolerated the procedure well with no immediate complications.

## 2017-05-10 NOTE — Anesthesia Procedure Notes (Signed)
Procedure Name: Intubation Date/Time: 05/10/2017 10:15 AM Performed by: Adria Dill, CRNA Pre-anesthesia Checklist: Patient identified, Emergency Drugs available, Suction available and Patient being monitored Patient Re-evaluated:Patient Re-evaluated prior to induction Oxygen Delivery Method: Circle system utilized Preoxygenation: Pre-oxygenation with 100% oxygen Induction Type: IV induction Ventilation: Mask ventilation without difficulty Laryngoscope Size: Miller and 3 Grade View: Grade I Nasal Tubes: Right, Nasal prep performed, Nasal Rae and Magill forceps- large, utilized Tube size: 7.5 mm Number of attempts: 1 Placement Confirmation: ETT inserted through vocal cords under direct vision,  positive ETCO2,  CO2 detector and breath sounds checked- equal and bilateral Tube secured with: Tape Dental Injury: Teeth and Oropharynx as per pre-operative assessment  Comments: Afrin to bilateral nares. Pt preO2 x3 mins. Smooth IV induction. EZ mask. Right nare dilated with NA 7.0, 7.5, 8.0 with ease by Dr Desmond Lope. 7.5 NRT passed through right nare with ease. DL x1; Mil3; Gr 1 view. NRT tip free of tissue, minimal blood noted in airway. Large Magill forceps used to pass NRT through glottic opening with ease. +ETCO2, =BBS. VSS.

## 2017-05-10 NOTE — Progress Notes (Signed)
PRE-OPERATIVE NOTE:  05/10/2017 Roxy Horseman [id number]  VITALS: BP 115/72 (BP Location: Left Arm)   Pulse 93   Temp 97.8 F (36.6 C) (Oral)   Resp 18   Ht 6' (1.829 m)   Wt 202 lb 11.2 oz (91.9 kg)   SpO2 99%   BMI 27.49 kg/m   Lab Results  Component Value Date   WBC 6.5 05/10/2017   HGB 11.7 (L) 05/10/2017   HCT 36.4 (L) 05/10/2017   MCV 86.3 05/10/2017   PLT 288 05/10/2017   BMET    Component Value Date/Time   NA 133 (L) 05/10/2017 0544   K 4.1 05/10/2017 0544   CL 103 05/10/2017 0544   CO2 22 05/10/2017 0544   GLUCOSE 136 (H) 05/10/2017 0544   BUN 34 (H) 05/10/2017 0544   CREATININE 1.70 (H) 05/10/2017 0544   CALCIUM 8.9 05/10/2017 0544   GFRNONAA 46 (L) 05/10/2017 0544   GFRAA 53 (L) 05/10/2017 0544    Lab Results  Component Value Date   INR 1.20 05/10/2017   INR 1.32 05/09/2017   INR 1.37 05/08/2017   No results found for: PTT   Zacharay Dragone presents for extraction of remaining teeth with valvuloplasty in the operating room where general anesthesia.     SUBJECTIVE: The patient denies any acute medical or dental changes and agrees to proceed with treatment as planned.  EXAM: No sign of acute dental changes.  ASSESSMENT: Patient is affected by chronic apical periodontitis, caries, chronic periodontitis, Accretions, and multiple loose teeth.  PLAN: Patient agrees to proceed with treatment as planned in the operating room as previously discussed and accepts the risks, benefits, and complications of the proposed treatment. Patient is aware of the risk for bleeding, bruising, swelling, infection, pain, nerve damage, soft tissue damage, sinus involvement, root tip fracture, mandible fracture, and the risks of complications associated with the anesthesia. Patient also is aware of the potential for other complications up to and including death due to his overall cardiovascular compromise.     Charlynne Pander, DDS

## 2017-05-10 NOTE — Progress Notes (Signed)
ANTICOAGULATION CONSULT NOTE  Pharmacy Consult for Heparin Indication: atrial fibrillation/LV thrombus   Allergies  Allergen Reactions  . Bee Venom     UNSPECIFIED REACTION    Patient Measurements: Height: 6' (182.9 cm) Weight: 202 lb 11.2 oz (91.9 kg) IBW/kg (Calculated) : 77.6  Heparin dosing weight: 99 kg   Vital Signs: Temp: 98 F (36.7 C) (04/25 1303) Temp Source: Oral (04/25 0415) BP: 122/87 (04/25 1303) Pulse Rate: 98 (04/25 1303)  Labs: Recent Labs    05/07/17 1539  05/08/17 0542 05/09/17 0414 05/10/17 0544  HGB  --    < > 12.3* 12.0* 11.7*  HCT  --   --  37.9* 37.4* 36.4*  PLT  --   --  271 290 288  LABPROT  --   --  16.7* 16.2* 15.1  INR  --   --  1.37 1.32 1.20  HEPARINUNFRC 0.51  --  0.55 0.64  --   CREATININE  --   --  1.81* 1.70* 1.70*   < > = values in this interval not displayed.   Assessment: 12 yoM with h/o HF admitted with volume overload and in cardiogenic shock. Started on heparin gtt for afib with RVR. No AC PTA - previously on rivaroxaban but fell, hit his head, and developed [facility] 11/18. Last head CT showed no increase hemorrhage. ECHO 4/8 showed LVEF 20-25% and large LV thrombus.   Pt had tooth extraction today per MD. Rip Harbour to resume heparin infusion tomorrow at 0800 with no bolus. Heparin level therapeutic was 0.64 at 1550 units/hr prior to tooth extraction. Hgb stable at 11.7, plts 288. No signs/symptoms of bleeding. No infusion issues documented.    Goal of Therapy:  Heparin level 0.3-0.7 units/ml INR goal: 2-3 Monitor platelets by anticoagulation protocol: Yes   Plan:  Restart heparin infusion 1550 units/hr on 4/26 at 0800. Monitor daily HL, CBC, s/sx of bleeding  Continue to hold warfarin for plans for IABP/LVAD work-up.    Bonney Leitz PharmD Student 05/10/2017 2:41 PM

## 2017-05-11 ENCOUNTER — Encounter (HOSPITAL_COMMUNITY): Payer: Self-pay | Admitting: Dentistry

## 2017-05-11 ENCOUNTER — Inpatient Hospital Stay (HOSPITAL_COMMUNITY): Payer: Medicaid Other

## 2017-05-11 DIAGNOSIS — Z01818 Encounter for other preprocedural examination: Secondary | ICD-10-CM

## 2017-05-11 DIAGNOSIS — K08199 Complete loss of teeth due to other specified cause, unspecified class: Secondary | ICD-10-CM

## 2017-05-11 DIAGNOSIS — K0820 Unspecified atrophy of edentulous alveolar ridge: Secondary | ICD-10-CM

## 2017-05-11 DIAGNOSIS — I5022 Chronic systolic (congestive) heart failure: Secondary | ICD-10-CM

## 2017-05-11 DIAGNOSIS — K08109 Complete loss of teeth, unspecified cause, unspecified class: Secondary | ICD-10-CM

## 2017-05-11 LAB — COOXEMETRY PANEL
Carboxyhemoglobin: 1.3 % (ref 0.5–1.5)
Methemoglobin: 1.4 % (ref 0.0–1.5)
O2 Saturation: 67.3 %
Total hemoglobin: 13.3 g/dL (ref 12.0–16.0)

## 2017-05-11 LAB — COMPREHENSIVE METABOLIC PANEL
ALT: 18 U/L (ref 17–63)
AST: 27 U/L (ref 15–41)
Albumin: 3 g/dL — ABNORMAL LOW (ref 3.5–5.0)
Alkaline Phosphatase: 84 U/L (ref 38–126)
Anion gap: 12 (ref 5–15)
BUN: 37 mg/dL — ABNORMAL HIGH (ref 6–20)
CO2: 23 mmol/L (ref 22–32)
Calcium: 9.6 mg/dL (ref 8.9–10.3)
Chloride: 98 mmol/L — ABNORMAL LOW (ref 101–111)
Creatinine, Ser: 1.83 mg/dL — ABNORMAL HIGH (ref 0.61–1.24)
GFR calc Af Amer: 49 mL/min — ABNORMAL LOW (ref >60)
GFR calc non Af Amer: 42 mL/min — ABNORMAL LOW (ref >60)
Glucose, Bld: 155 mg/dL — ABNORMAL HIGH (ref 65–99)
Potassium: 4.4 mmol/L (ref 3.5–5.1)
Sodium: 133 mmol/L — ABNORMAL LOW (ref 135–145)
Total Bilirubin: 1.1 mg/dL (ref 0.3–1.2)
Total Protein: 9.2 g/dL — ABNORMAL HIGH (ref 6.5–8.1)

## 2017-05-11 LAB — GLUCOSE, CAPILLARY
Glucose-Capillary: 141 mg/dL — ABNORMAL HIGH (ref 65–99)
Glucose-Capillary: 152 mg/dL — ABNORMAL HIGH (ref 65–99)
Glucose-Capillary: 157 mg/dL — ABNORMAL HIGH (ref 65–99)

## 2017-05-11 LAB — CBC
HCT: 40.7 % (ref 39.0–52.0)
Hemoglobin: 13.3 g/dL (ref 13.0–17.0)
MCH: 28.1 pg (ref 26.0–34.0)
MCHC: 32.7 g/dL (ref 30.0–36.0)
MCV: 86 fL (ref 78.0–100.0)
Platelets: 335 10*3/uL (ref 150–400)
RBC: 4.73 MIL/uL (ref 4.22–5.81)
RDW: 15.9 % — ABNORMAL HIGH (ref 11.5–15.5)
WBC: 12.4 10*3/uL — ABNORMAL HIGH (ref 4.0–10.5)

## 2017-05-11 LAB — PROTIME-INR
INR: 1.16
Prothrombin Time: 14.7 seconds (ref 11.4–15.2)

## 2017-05-11 LAB — PREALBUMIN: Prealbumin: 18.9 mg/dL (ref 18–38)

## 2017-05-11 LAB — MAGNESIUM: Magnesium: 2.2 mg/dL (ref 1.7–2.4)

## 2017-05-11 LAB — HEPARIN LEVEL (UNFRACTIONATED): Heparin Unfractionated: 0.49 IU/mL (ref 0.30–0.70)

## 2017-05-11 NOTE — Progress Notes (Signed)
Palliative Medicine RN Note: Chart check to follow up on HCPOA completion. Patient appears to have not completed these forms with chaplain, as their note does not address it or indicate they are complete.  Re-consulted spiritual care specifically for HCPOA.  Margret Chance Shane Melby, RN, BSN, [facility] Palliative Medicine Team 05/11/2017 2:39 PM Office [phone]

## 2017-05-11 NOTE — Progress Notes (Signed)
1452 Offered to walk with pt. Pt declined at this time. Not up to it. Will try to walk with staff later he stated. Luetta Nutting RN BSN 05/11/2017 2:52 PM

## 2017-05-11 NOTE — Progress Notes (Signed)
POST OPERATIVE NOTE:  05/11/2017 Benjamin Sherman [id number]  VITALS: BP 120/87 (BP Location: Left Arm)   Pulse (!) 101   Temp 98 F (36.7 C) (Oral)   Resp 18   Ht 6' (1.829 m)   Wt 200 lb 14.4 oz (91.1 kg)   SpO2 100%   BMI 27.25 kg/m   LABS:  Lab Results  Component Value Date   WBC 12.4 (H) 05/11/2017   HGB 13.3 05/11/2017   HCT 40.7 05/11/2017   MCV 86.0 05/11/2017   PLT 335 05/11/2017   BMET    Component Value Date/Time   NA 133 (L) 05/11/2017 0350   K 4.4 05/11/2017 0350   CL 98 (L) 05/11/2017 0350   CO2 23 05/11/2017 0350   GLUCOSE 155 (H) 05/11/2017 0350   BUN 37 (H) 05/11/2017 0350   CREATININE 1.83 (H) 05/11/2017 0350   CALCIUM 9.6 05/11/2017 0350   GFRNONAA 42 (L) 05/11/2017 0350   GFRAA 49 (L) 05/11/2017 0350    Lab Results  Component Value Date   INR 1.16 05/11/2017   INR 1.20 05/10/2017   INR 1.32 05/09/2017   No results found for: PTT   Benjamin Sherman is status post extraction of remaining teeth with alveoloplasty and pre-prosthetic surgery as needed in the operating room with general anesthesia on 05/10/2017.  SUBJECTIVE: The patient has minimal complaints of pain. Patient denies any active bleeding.  EXAM: There is no sign oral infection, heme, or ooze. Sutures are intact. Clots are present. Mouth is moist. Patient is to restart heparin therapy this morning at 8 AM.  ASSESSMENT: Post operative course is consistent with dental procedures performed in the operating room with general anesthesia. Loss of teeth due to extraction The patient is now completely edentulous There is atrophy of the edentulous alveolar ridges.  PLAN: 1.  Start salt water rinses as needed to aid healing. 2.  Advance diet as tolerated.  Appreciate nutritional consultation. 3. Sutures are to dissolve on their own. Patient is to call for appointment for evaluation of healing once he is discharged after the anticipated LVAD procedure. 4. Patient is to follow-up with the  general dentist of his choice for fabrication of upper lower complete dentures after adequate healing and once medically stable from the anticipated LVAD procedure. 5. Patient is now dentally cleared to proceed with the LVAD procedure at the discretion of Dr. Kathlee Nations Trigt.   Charlynne Pander, DDS

## 2017-05-11 NOTE — Progress Notes (Signed)
ANTICOAGULATION CONSULT NOTE  Pharmacy Consult for Heparin Indication: atrial fibrillation/LV thrombus   Allergies  Allergen Reactions  . Bee Venom     UNSPECIFIED REACTION    Patient Measurements: Height: 6' (182.9 cm) Weight: 200 lb 14.4 oz (91.1 kg) IBW/kg (Calculated) : 77.6  Heparin dosing weight: 99 kg   Vital Signs: Temp: 98.5 F (36.9 C) (04/26 1702) Temp Source: Oral (04/26 1702) BP: 113/79 (04/26 1702) Pulse Rate: 104 (04/26 1702)  Labs: Recent Labs    05/09/17 0414 05/10/17 0544 05/11/17 0350 05/11/17 1558  HGB 12.0* 11.7* 13.3  --   HCT 37.4* 36.4* 40.7  --   PLT 290 288 335  --   LABPROT 16.2* 15.1 14.7  --   INR 1.32 1.20 1.16  --   HEPARINUNFRC 0.64  --   --  0.49  CREATININE 1.70* 1.70* 1.83*  --    Assessment: 20 yoM with h/o HF admitted with volume overload and in cardiogenic shock. Started on heparin gtt for afib with RVR. No AC PTA - previously on rivaroxaban but fell, hit his head, and developed [facility] 11/18. Last head CT showed no increase hemorrhage. ECHO 4/8 showed LVEF 20-25% and large LV thrombus.   Pt had tooth extraction 4/25 per Dr.Kulinski. Heparin resumed this am, no bleeding issues noted. CBC stable this am.   HL 0.49 within goal range   Goal of Therapy:  Heparin level 0.3-0.7 units/ml Monitor platelets by anticoagulation protocol: Yes   Plan:  Continue heparin infusion 1550 units/hr Monitor daily HL, CBC, s/sx of bleeding  Continue to hold warfarin for plans for IABP/LVAD work-up.  Sheppard Coil PharmD., BCPS Clinical Pharmacist 05/11/2017 5:34 PM

## 2017-05-11 NOTE — Progress Notes (Addendum)
Patient ID: Benjamin Sherman, male   DOB: 1970/07/20, 47 y.o.   MRN: [id number]     Advanced Heart Failure Rounding Note  Subjective:    Admitted 04/21/17 with cardiogenic shock and atrial flutter.   Central line placed. Initial co-ox 39%.  IABP placed 04/25/17-> 04/30/17 with persistent cardiogenic shock.   DCCV atrial flutter 4/12.  Maintaining NSR on PO amio  Coox 67% this am on Milrinone 0.375 mcg/kg/min. Modest UOP with PO lasix. Weight down 2 lbs today. CVP 7.  S/p multiple teeth extractions 05/11/17. Heparin held overnight, restarted this morning. On clear liquid diet.   No CP, SOB, orthopnea, or dizziness. Feeling better. LE erythema resolved. Has some mouth pain, but not too bad. Good appetite with clear liquid diet.   Planning for LVAD Tuesday. Will need IABP Monday.  Echo 4/8: LVEF 20-25% with LV apical clot. RV mild to moderately down.  Repeat echo 4/19 unchanged RV moderately down   Creatinine 2.02 => 1.73=> 1.7 => 1.85 => 1.93=> 1.92 => 1.7 => 1.8 => 1.6 => 1.8 => 1.7 => 1.7 => 1.8  Objective:   Weight Range:  Vital Signs:   Temp:  [97.4 F (36.3 C)-98.1 F (36.7 C)] 98 F (36.7 C) (04/26 0403) Pulse Rate:  [98-104] 101 (04/26 0403) Resp:  [18-22] 18 (04/26 0403) BP: (111-129)/(77-97) 120/87 (04/26 0403) SpO2:  [95 %-100 %] 100 % (04/26 0403) Weight:  [200 lb 14.4 oz (91.1 kg)] 200 lb 14.4 oz (91.1 kg) (04/26 0500) Last BM Date: 05/10/17  Weight change: Filed Weights   05/09/17 0437 05/10/17 0415 05/11/17 0500  Weight: 202 lb 8 oz (91.9 kg) 202 lb 11.2 oz (91.9 kg) 200 lb 14.4 oz (91.1 kg)   Intake/Output:   Intake/Output Summary (Last 24 hours) at 05/11/2017 0802 Last data filed at 05/11/2017 0646 Gross per 24 hour  Intake 2221.71 ml  Output 1350 ml  Net 871.71 ml    Physical Exam   CVP: 7 General:Sitting in chair No resp difficulty. HEENT: Mouth packed with gauze otherwise normal  Neck: Supple. JVP 6-7. Carotids 2+ bilat; no bruits. No thyromegaly  or nodule noted. Cor: PMI laterally displaced. RRR, +S3 Lungs: CTAB, normal effort. Abdomen: Soft, non-tender, non-distended, no HSM. No bruits or masses. +BS  Extremities: No cyanosis, clubbing, or rash. R and LLE no edema. Rt ankle mild erythema (almost resolved). Warm extremities Neuro: alert & oriented x 3, cranial nerves grossly intact. moves all 4 extremities w/o difficulty. Affect pleasant   Telemetry    SR with 1st degree AV block, 90-100s. Personally reviewed.   EKG   No new tracings.   Labs    Basic Metabolic Panel: Recent Labs  Lab 05/07/17 0531 05/08/17 0542 05/09/17 0414 05/10/17 0544 05/11/17 0350  NA 132* 133* 136 133* 133*  K 3.8 4.1 4.1 4.1 4.4  CL 99* 101 100* 103 98*  CO2 24 23 27 22 23   GLUCOSE 112* 114* 89 136* 155*  BUN 29* 30* 28* 34* 37*  CREATININE 1.62* 1.81* 1.70* 1.70* 1.83*  CALCIUM 8.8* 8.7* 9.0 8.9 9.6  MG 1.8 1.8 2.2 1.9 2.2   Liver Function Tests: Recent Labs  Lab 05/08/17 0542 05/11/17 0350  AST 24 27  ALT 19 18  ALKPHOS 81 84  BILITOT 0.9 1.1  PROT 7.6 9.2*  ALBUMIN 2.5* 3.0*   No results for input(s): LIPASE, AMYLASE in the last 168 hours. No results for input(s): AMMONIA in the last 168 hours.  CBC: Recent Labs  Lab 05/07/17 0531 05/08/17 0542 05/09/17 0414 05/10/17 0544 05/11/17 0350  WBC 3.9* 6.1 5.7 6.5 12.4*  HGB 15.3 12.3* 12.0* 11.7* 13.3  HCT 45.6 37.9* 37.4* 36.4* 40.7  MCV 85.1 86.1 86.4 86.3 86.0  PLT 177 271 290 288 335   Cardiac Enzymes: No results for input(s): CKTOTAL, CKMB, CKMBINDEX, TROPONINI in the last 168 hours. BNP: BNP (last 3 results) Recent Labs    11/06/16 1621 03/25/17 1401 04/21/17 1212  BNP 961.6* 835.4* 483.0*   ProBNP (last 3 results) No results for input(s): PROBNP in the last 8760 hours.  Other results:  Imaging: [place] Ekg Site Rite  Result Date: 05/09/2017 If Site Rite image not attached, placement could not be confirmed due to current cardiac  rhythm.   Medications:    Scheduled Medications: . amiodarone  200 mg Oral BID  . Chlorhexidine Gluconate Cloth  6 each Topical Daily  . Chlorhexidine Gluconate Cloth  6 each Topical Daily  . digoxin  0.125 mg Oral Daily  . doxycycline  100 mg Oral Q12H  . feeding supplement (PRO-STAT SUGAR FREE 64)  30 mL Oral BID  . furosemide  80 mg Oral Daily  . insulin aspart  0-15 Units Subcutaneous TID WC  . insulin aspart  0-5 Units Subcutaneous QHS  . insulin glargine  10 Units Subcutaneous QHS  . mupirocin ointment  1 application Nasal BID  . nutrition supplement (JUVEN)  1 packet Oral BID BM  . predniSONE  40 mg Oral Q breakfast  . sodium chloride flush  10-40 mL Intracatheter Q12H  . sodium chloride flush  10-40 mL Intracatheter Q12H  . sodium chloride flush  3 mL Intravenous Q12H  . spironolactone  12.5 mg Oral Daily    Infusions: . sodium chloride Stopped (05/01/17 1001)  . sodium chloride Stopped (05/01/17 0800)  . sodium chloride    . heparin    . lactated ringers Stopped (05/10/17 1900)  . lactated ringers    . milrinone 0.375 mcg/kg/min (05/11/17 2800)    PRN Medications:    Assessment:   Benjamin Sherman is a 47 y.o. 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 eosinophilic cardiomyopathy, LV thrombus, hypothyroidism, DM2, CKD Stage II-III and traumatic [facility] 11/18 after fall.   Admitted with cardiogenic shock.  Plan/Discussion:    1. Acute on chronic systolic HF -> cardiogenic shock: ECHO 11/2016 EF 25-30% CMRI EF 22%. Bedside echo in HF clinic EF 20% in 3/19.  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 Did not respond to steroids. Admitted 4/6 with cardiogenic shock and volume overload co-ox 38%. Echo 04/23/17: LVEF 20-25% with large LV clot, RV mild to moderately down. - IABP out. Swan pulled 05/01/17. - Coox 67% this am on milrinone 0.375 mcg/kg/min.  - If coox drops, will move back to unit for levophed and possible IABP to VAD.   - Volume stable on exam.  CVP ~7 - Continue 80 mg lasix po. May need to hold in next day or two with decreased intake s/p teeth extraction. This morning he has a good appetite with clear liquid diet. Will watch closely.  - Continue digoxin 0.125. Level 0.4 4/22 - Continue spiro 12.5 mg daily. - No ACE/ARB/b-blocker with shock - Echo reviewed personally with LVEF 20% moderate RV dysfunction   - We have been unable to wean inotropic support and even on fairly high-dose milrinone he remains very tenuous. Pt will likely die soon if there is no durable option for  him. Not transplant candidate due to limited support. RV tenuous but may be sufficient.  - Plan for LVAD on Tuesday. He will need IABP back in on Monday prior to surgery.  2. Atrial flutter: New onset. S/p DCCV to NSR on 4/12.  - Remains in NSR s/p DCCV. Continue amio 200 mg BID.  - Continue heparin gtt for now pending possible IABP/VAD next week.  - INR 1.16. Off warfarin. Cont heparin drip with surgery next week.  - Previously off Xarelto due to [facility].  3. AKI CKD Stage II-III: Likely ATN due to shock - Creatinine 1.83 4. H/o LV Thrombus - now recurrent on echo 04/23/17: Off Xarelto due to previous SAH.  - Heparin held overnight, now restarted.  5. DMII - Cover with SSI plus lantus.  - No change.  6. Hypothyroidism: TFTs OK 04/22/17.  - TSH elevated at 10.848 4/23. T4 and T3 normal.  No change.  7. RLE erythema/cellulitis - Started doxy 4/20. Almost resolved.  - Day 3/3 of 40 mg prednisone for gout flare. Uric acid 11.5  Alford Highland, NP  05/11/2017 8:02 AM   Advanced Heart Failure Team Pager [phone] (M-F; 7a - 4p)  Please contact [facility] for night-coverage after hours (4p -7a ) and weekends on [email or web address]  Patient seen and examined with the above-signed Advanced Practice Provider and/or Housestaff. I personally reviewed laboratory data, imaging studies and relevant notes. I independently examined the patient and  formulated the important aspects of the plan. I have edited the note to reflect any of my changes or salient points. I have personally discussed the plan with the patient and/or family.  Remains inotrope dependent but stable on current dose of milrinone. Co-ox and CVP ok. Renal function stable. Recovering from teeth extraction. For IABP Monday. VAD on Tuesday.   [name], MD  2:03 PM

## 2017-05-11 NOTE — Plan of Care (Signed)
  Problem: Skin Integrity: Goal: Risk for impaired skin integrity will decrease Outcome: Progressing   

## 2017-05-11 NOTE — Anesthesia Postprocedure Evaluation (Signed)
Anesthesia Post Note  Patient: Jecory Voce  Procedure(s) Performed: Extraction of 2-6, 8-15, 17, 19-24, 27- 30, and 32 with alveoloplasty, and maxillary and mandibular lateral exotoses reductions. (N/A )     Patient location during evaluation: PACU Anesthesia Type: General Level of consciousness: awake and alert Pain management: pain level controlled Vital Signs Assessment: post-procedure vital signs reviewed and stable Respiratory status: spontaneous breathing, nonlabored ventilation, respiratory function stable and patient connected to nasal cannula oxygen Cardiovascular status: blood pressure returned to baseline and stable Postop Assessment: no apparent nausea or vomiting Anesthetic complications: no    Last Vitals:  Vitals:   05/11/17 0812 05/11/17 0816  BP:    Pulse:  98  Resp:    Temp: 36.9 C   SpO2:      Last Pain:  Vitals:   05/11/17 0812  TempSrc: Oral  PainSc:                  Cecile Hearing

## 2017-05-11 NOTE — Progress Notes (Signed)
ANTICOAGULATION CONSULT NOTE  Pharmacy Consult for Heparin Indication: atrial fibrillation/LV thrombus   Allergies  Allergen Reactions  . Bee Venom     UNSPECIFIED REACTION    Patient Measurements: Height: 6' (182.9 cm) Weight: 200 lb 14.4 oz (91.1 kg) IBW/kg (Calculated) : 77.6  Heparin dosing weight: 99 kg   Vital Signs: Temp: 98.5 F (36.9 C) (04/26 0812) Temp Source: Oral (04/26 0812) BP: 116/84 (04/26 0749) Pulse Rate: 98 (04/26 0816)  Labs: Recent Labs    05/09/17 0414 05/10/17 0544 05/11/17 0350  HGB 12.0* 11.7* 13.3  HCT 37.4* 36.4* 40.7  PLT 290 288 335  LABPROT 16.2* 15.1 14.7  INR 1.32 1.20 1.16  HEPARINUNFRC 0.64  --   --   CREATININE 1.70* 1.70* 1.83*   Assessment: 12 yoM with h/o HF admitted with volume overload and in cardiogenic shock. Started on heparin gtt for afib with RVR. No AC PTA - previously on rivaroxaban but fell, hit his head, and developed [facility] 11/18. Last head CT showed no increase hemorrhage. ECHO 4/8 showed LVEF 20-25% and large LV thrombus.   Pt had tooth extraction 4/25 per Dr.Kulinski. Heparin resumed this am, no bleeding issues noted. CBC stable this am.    Goal of Therapy:  Heparin level 0.3-0.7 units/ml Monitor platelets by anticoagulation protocol: Yes   Plan:  Restarted heparin infusion 1550 units/hr this am Monitor daily HL, CBC, s/sx of bleeding  Continue to hold warfarin for plans for IABP/LVAD work-up.  Sheppard Coil PharmD., BCPS Clinical Pharmacist 05/11/2017 11:38 AM

## 2017-05-12 LAB — SURGICAL PCR SCREEN
MRSA, PCR: NEGATIVE
Staphylococcus aureus: NEGATIVE

## 2017-05-12 LAB — BASIC METABOLIC PANEL
Anion gap: 9 (ref 5–15)
BUN: 45 mg/dL — ABNORMAL HIGH (ref 6–20)
CO2: 26 mmol/L (ref 22–32)
Calcium: 9.6 mg/dL (ref 8.9–10.3)
Chloride: 100 mmol/L — ABNORMAL LOW (ref 101–111)
Creatinine, Ser: 1.55 mg/dL — ABNORMAL HIGH (ref 0.61–1.24)
GFR calc Af Amer: 60 mL/min — ABNORMAL LOW (ref >60)
GFR calc non Af Amer: 51 mL/min — ABNORMAL LOW (ref >60)
Glucose, Bld: 122 mg/dL — ABNORMAL HIGH (ref 65–99)
Potassium: 4 mmol/L (ref 3.5–5.1)
Sodium: 135 mmol/L (ref 135–145)

## 2017-05-12 LAB — CBC
HCT: 39.7 % (ref 39.0–52.0)
Hemoglobin: 13.1 g/dL (ref 13.0–17.0)
MCH: 28.3 pg (ref 26.0–34.0)
MCHC: 33 g/dL (ref 30.0–36.0)
MCV: 85.7 fL (ref 78.0–100.0)
Platelets: 335 10*3/uL (ref 150–400)
RBC: 4.63 MIL/uL (ref 4.22–5.81)
RDW: 16 % — ABNORMAL HIGH (ref 11.5–15.5)
WBC: 12.6 10*3/uL — ABNORMAL HIGH (ref 4.0–10.5)

## 2017-05-12 LAB — COOXEMETRY PANEL
Carboxyhemoglobin: 1.1 % (ref 0.5–1.5)
Methemoglobin: 1.2 % (ref 0.0–1.5)
O2 Saturation: 59.9 %
Total hemoglobin: 13.7 g/dL (ref 12.0–16.0)

## 2017-05-12 LAB — GLUCOSE, CAPILLARY
Glucose-Capillary: 152 mg/dL — ABNORMAL HIGH (ref 65–99)
Glucose-Capillary: 118 mg/dL — ABNORMAL HIGH (ref 65–99)
Glucose-Capillary: 117 mg/dL — ABNORMAL HIGH (ref 65–99)
Glucose-Capillary: 153 mg/dL — ABNORMAL HIGH (ref 65–99)
Glucose-Capillary: 157 mg/dL — ABNORMAL HIGH (ref 65–99)

## 2017-05-12 LAB — HEPARIN LEVEL (UNFRACTIONATED): Heparin Unfractionated: 0.43 IU/mL (ref 0.30–0.70)

## 2017-05-12 MED ORDER — ALLOPURINOL 300 MG PO TABS
300.0000 mg | ORAL_TABLET | Freq: Every day | ORAL | Status: DC
  Administered 2017-05-12 – 2017-05-14 (×3): 300 mg via ORAL
  Filled 2017-05-12 (×3): qty 1

## 2017-05-12 NOTE — Plan of Care (Signed)
Ambulates in room. Able to perform ADL's per self.

## 2017-05-12 NOTE — Progress Notes (Signed)
ANTICOAGULATION CONSULT NOTE  Pharmacy Consult for Heparin  Indication: atrial fibrillation/LV thrombus   Allergies  Allergen Reactions  . Bee Venom     UNSPECIFIED REACTION    Patient Measurements: Height: 6' (182.9 cm) Weight: 196 lb 10.4 oz (89.2 kg) IBW/kg (Calculated) : 77.6  Heparin dosing weight: 99 kg   Vital Signs: Temp: 98.4 F (36.9 C) (04/27 0415) Temp Source: Oral (04/27 0415) BP: 128/90 (04/27 0415) Pulse Rate: 105 (04/27 0415)  Labs: Recent Labs    05/10/17 0544 05/11/17 0350 05/11/17 1558 05/12/17 0425  HGB 11.7* 13.3  --  13.1  HCT 36.4* 40.7  --  39.7  PLT 288 335  --  335  LABPROT 15.1 14.7  --   --   INR 1.20 1.16  --   --   HEPARINUNFRC  --   --  0.49 0.43  CREATININE 1.70* 1.83*  --  1.55*   Assessment: 68 yoM with h/o HF admitted with volume overload and in cardiogenic shock. Started on heparin gtt for afib with RVR. No AC PTA - previously on rivaroxaban but fell, hit his head, and developed [facility] 11/18. Last head CT showed no increase hemorrhage. ECHO 4/8 showed LVEF 20-25% and large LV thrombus.   Pt had tooth extraction 4/25 per Dr.Kulinski.  Heparin resumed yesterday 4/26, no bleeding issues noted. CBC stable this am.  Heparin level therapeutic this am at 0.43.  Goal of Therapy:  Heparin level 0.3-0.7 units/ml Monitor platelets by anticoagulation protocol: Yes   Plan:  Continue heparin infusion 1550 units/hr Monitor daily HL, CBC, s/sx of bleeding  Continue to hold warfarin for plans for IABP/LVAD work-up.   Senaya Dicenso L. Marcy Salvo, PharmD, MS PGY1 Pharmacy Resident Pager: [phone]

## 2017-05-12 NOTE — Progress Notes (Signed)
Patient ID: Benjamin Sherman, male   DOB: August 27, 1970, 47 y.o.   MRN: [id number]     Advanced Heart Failure Rounding Note  Subjective:    Events:  -Admitted 04/21/17 with cardiogenic shock and atrial flutter.  -Central line placed. Initial co-ox 39%. -IABP placed 04/25/17-> 04/30/17 with persistent cardiogenic shock.  -DCCV atrial flutter 4/12.   - S/p multiple teeth extractions 05/11/17.   Mouth still sore. Co-ox 60% on milrinone 0.375. Denies CP or SOB. Creatinine down to 1.55   Echo 4/8: LVEF 20-25% with LV apical clot. RV mild to moderately down.  Repeat echo 4/19 unchanged RV moderately down   Objective:   Weight Range:  Vital Signs:   Temp:  [98.1 F (36.7 C)-98.5 F (36.9 C)] 98.1 F (36.7 C) (04/27 0752) Pulse Rate:  [90-105] 102 (04/27 0752) Resp:  [18-20] 18 (04/27 0752) BP: (113-128)/(79-95) 123/85 (04/27 0752) SpO2:  [98 %-100 %] 99 % (04/27 0752) Weight:  [89.2 kg (196 lb 10.4 oz)] 89.2 kg (196 lb 10.4 oz) (04/27 0417) Last BM Date: 05/10/17  Weight change: Filed Weights   05/10/17 0415 05/11/17 0500 05/12/17 0417  Weight: 91.9 kg (202 lb 11.2 oz) 91.1 kg (200 lb 14.4 oz) 89.2 kg (196 lb 10.4 oz)   Intake/Output:   Intake/Output Summary (Last 24 hours) at 05/12/2017 0908 Last data filed at 05/12/2017 0522 Gross per 24 hour  Intake 2019.45 ml  Output 1850 ml  Net 169.45 ml    Physical Exam   General:  Sitting in chair. No resp difficulty HEENT: normal x for mouth swelling Neck: supple. JVP 6 Carotids 2+ bilat; no bruits. No lymphadenopathy or thryomegaly appreciated. Cor: PMI laterally displaced. Regular rate & rhythm. +s3 Lungs: clear Abdomen: soft, nontender, nondistended. No hepatosplenomegaly. No bruits or masses. Good bowel sounds. Extremities: no cyanosis, clubbing, rash, edema Neuro: alert & orientedx3, cranial nerves grossly intact. moves all 4 extremities w/o difficulty. Affect pleasant   Telemetry    SR 90-100 Personally reviewed.   EKG    No new tracings.   Labs    Basic Metabolic Panel: Recent Labs  Lab 05/07/17 0531 05/08/17 0542 05/09/17 0414 05/10/17 0544 05/11/17 0350 05/12/17 0425  NA 132* 133* 136 133* 133* 135  K 3.8 4.1 4.1 4.1 4.4 4.0  CL 99* 101 100* 103 98* 100*  CO2 24 23 27 22 23 26   GLUCOSE 112* 114* 89 136* 155* 122*  BUN 29* 30* 28* 34* 37* 45*  CREATININE 1.62* 1.81* 1.70* 1.70* 1.83* 1.55*  CALCIUM 8.8* 8.7* 9.0 8.9 9.6 9.6  MG 1.8 1.8 2.2 1.9 2.2  --    Liver Function Tests: Recent Labs  Lab 05/08/17 0542 05/11/17 0350  AST 24 27  ALT 19 18  ALKPHOS 81 84  BILITOT 0.9 1.1  PROT 7.6 9.2*  ALBUMIN 2.5* 3.0*   No results for input(s): LIPASE, AMYLASE in the last 168 hours. No results for input(s): AMMONIA in the last 168 hours.  CBC: Recent Labs  Lab 05/08/17 0542 05/09/17 0414 05/10/17 0544 05/11/17 0350 05/12/17 0425  WBC 6.1 5.7 6.5 12.4* 12.6*  HGB 12.3* 12.0* 11.7* 13.3 13.1  HCT 37.9* 37.4* 36.4* 40.7 39.7  MCV 86.1 86.4 86.3 86.0 85.7  PLT 271 290 288 335 335   Cardiac Enzymes: No results for input(s): CKTOTAL, CKMB, CKMBINDEX, TROPONINI in the last 168 hours. BNP: BNP (last 3 results) Recent Labs    11/06/16 1621 03/25/17 1401 04/21/17 1212  BNP 961.6* 835.4* 483.0*  ProBNP (last 3 results) No results for input(s): PROBNP in the last 8760 hours.  Other results:  Imaging: Dg Chest 2 View  Result Date: 05/11/2017 CLINICAL DATA:  Shortness of breath.  CHF. EXAM: CHEST - 2 VIEW COMPARISON:  04/26/2017. FINDINGS: Improved cardiomegaly. BILATERAL pulmonary opacities and pleural fluid are also improved. Central venous catheter has been removed from the RIGHT IJ, and replaced with a RIGHT arm PICC line with tip at the cavoatrial junction. No pneumothorax. IMPRESSION: Improved aeration. Electronically Signed   By: Elsie Stain M.D.   On: 05/11/2017 09:20    Medications:    Scheduled Medications: . amiodarone  200 mg Oral BID  . Chlorhexidine Gluconate  Cloth  6 each Topical Daily  . Chlorhexidine Gluconate Cloth  6 each Topical Daily  . digoxin  0.125 mg Oral Daily  . feeding supplement (PRO-STAT SUGAR FREE 64)  30 mL Oral BID  . furosemide  80 mg Oral Daily  . insulin aspart  0-15 Units Subcutaneous TID WC  . insulin aspart  0-5 Units Subcutaneous QHS  . insulin glargine  10 Units Subcutaneous QHS  . mupirocin ointment  1 application Nasal BID  . nutrition supplement (JUVEN)  1 packet Oral BID BM  . sodium chloride flush  10-40 mL Intracatheter Q12H  . sodium chloride flush  10-40 mL Intracatheter Q12H  . sodium chloride flush  3 mL Intravenous Q12H  . spironolactone  12.5 mg Oral Daily    Infusions: . sodium chloride Stopped (05/01/17 1001)  . sodium chloride Stopped (05/01/17 0800)  . sodium chloride    . heparin 1,550 Units/hr (05/12/17 0522)  . lactated ringers Stopped (05/10/17 1900)  . lactated ringers    . milrinone 0.375 mcg/kg/min (05/12/17 0522)    PRN Medications:    Assessment:   Benjamin Sherman is a 47 y.o. 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 eosinophilic cardiomyopathy, LV thrombus, hypothyroidism, DM2, CKD Stage II-III and traumatic [facility] 11/18 after fall.   Admitted with cardiogenic shock.  Plan/Discussion:    1. Acute on chronic systolic HF -> cardiogenic shock: ECHO 11/2016 EF 25-30% CMRI EF 22%. Bedside echo in HF clinic EF 20% in 3/19.  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 Did not respond to steroids. Admitted 4/6 with cardiogenic shock and volume overload co-ox 38%. Echo 04/23/17: LVEF 20-25% with large LV clot, RV mild to moderately down. - Coox 60% this am on milrinone 0.375 mcg/kg/min.  - Volume status looks good. Weight down  - Continue 80 mg lasix po. - Continue digoxin 0.125.  - Continue spiro 12.5 mg daily. - No ACE/ARB/b-blocker with shock - Echo reviewed personally with LVEF 20% moderate RV dysfunction   - We have been unable to wean  inotropic support and even on fairly high-dose milrinone he remains very tenuous. Pt will likely die soon if there is no durable option for him. Not transplant candidate due to limited support. RV tenuous but may be sufficient.  - Plan for LVAD on Tuesday. He will need IABP back in on Monday prior to surgery.  2. Atrial flutter: New onset. S/p DCCV to NSR on 4/12.  - Remains in NSR s/p DCCV. Continue amio 200 mg BID.  - Continue heparin gtt pending VAD Tuesday. Discussed dosing with PharmD personally. 3. AKI CKD Stage II-III: Likely ATN due to shock - Creatinine 1.55 today 4. H/o LV Thrombus - now recurrent on echo 04/23/17: Off Xarelto due to previous SAH.  -  On heparin  5. DMII - Cover with SSI plus lantus.  - No change.  6. Hypothyroidism: TFTs OK 04/22/17.  - TSH elevated at 10.848 4/23. T4 and T3 normal.  No change.  7. RLE erythema/cellulitis - Started doxy 4/20. Resolved  - Has received steroids for gout flare. Uric acid 11.5 - start allopurinol  [name], MD  05/12/2017 9:08 AM   Advanced Heart Failure Team Pager [phone] (M-F; 7a - 4p)  Please contact [facility] for night-coverage after hours (4p -7a ) and weekends on [email or web address]

## 2017-05-12 NOTE — Progress Notes (Signed)
CARDIAC REHAB PHASE I   PRE:  Rate/Rhythm: 103 ST  BP:  Supine:   Sitting: 116/80  Standing:    SaO2: 93%RA  MODE:  Ambulation: 1410 ft   POST:  Rate/Rhythm: 108 ST PVCs  BP:  Supine:   Sitting: 122/87  Standing:   SaO2: 97%RA 1350-1422 Pt walked 1410 ft on RA independently with steady gait and no SOB. Tolerated well. To recliner after walk.   Luetta Nutting, RN BSN  05/12/2017 2:19 PM

## 2017-05-13 LAB — URINALYSIS, ROUTINE W REFLEX MICROSCOPIC
Bacteria, UA: NONE SEEN
Bilirubin Urine: NEGATIVE
Glucose, UA: NEGATIVE mg/dL
Hgb urine dipstick: NEGATIVE
Ketones, ur: NEGATIVE mg/dL
Leukocytes, UA: NEGATIVE
Nitrite: NEGATIVE
Protein, ur: 100 mg/dL — AB
Specific Gravity, Urine: 1.011 (ref 1.005–1.030)
pH: 7 (ref 5.0–8.0)

## 2017-05-13 LAB — GLUCOSE, CAPILLARY
Glucose-Capillary: 131 mg/dL — ABNORMAL HIGH (ref 65–99)
Glucose-Capillary: 159 mg/dL — ABNORMAL HIGH (ref 65–99)
Glucose-Capillary: 213 mg/dL — ABNORMAL HIGH (ref 65–99)
Glucose-Capillary: 84 mg/dL (ref 65–99)

## 2017-05-13 LAB — COOXEMETRY PANEL
Carboxyhemoglobin: 1 % (ref 0.5–1.5)
Methemoglobin: 1.2 % (ref 0.0–1.5)
O2 Saturation: 58.6 %
Total hemoglobin: 14.4 g/dL (ref 12.0–16.0)

## 2017-05-13 LAB — BASIC METABOLIC PANEL
Anion gap: 7 (ref 5–15)
BUN: 47 mg/dL — ABNORMAL HIGH (ref 6–20)
CO2: 29 mmol/L (ref 22–32)
Calcium: 9.3 mg/dL (ref 8.9–10.3)
Chloride: 99 mmol/L — ABNORMAL LOW (ref 101–111)
Creatinine, Ser: 1.39 mg/dL — ABNORMAL HIGH (ref 0.61–1.24)
GFR calc Af Amer: >60 mL/min (ref >60)
GFR calc non Af Amer: 59 mL/min — ABNORMAL LOW (ref >60)
Glucose, Bld: 105 mg/dL — ABNORMAL HIGH (ref 65–99)
Potassium: 4 mmol/L (ref 3.5–5.1)
Sodium: 135 mmol/L (ref 135–145)

## 2017-05-13 LAB — CBC
HCT: 41.9 % (ref 39.0–52.0)
Hemoglobin: 13.9 g/dL (ref 13.0–17.0)
MCH: 28.4 pg (ref 26.0–34.0)
MCHC: 33.2 g/dL (ref 30.0–36.0)
MCV: 85.5 fL (ref 78.0–100.0)
Platelets: 340 10*3/uL (ref 150–400)
RBC: 4.9 MIL/uL (ref 4.22–5.81)
RDW: 15.7 % — ABNORMAL HIGH (ref 11.5–15.5)
WBC: 10.5 10*3/uL (ref 4.0–10.5)

## 2017-05-13 LAB — HEPARIN LEVEL (UNFRACTIONATED): Heparin Unfractionated: 0.57 IU/mL (ref 0.30–0.70)

## 2017-05-13 MED ORDER — SODIUM CHLORIDE 0.9 % IV SOLN
INTRAVENOUS | Status: DC
  Administered 2017-05-14: 05:00:00 via INTRAVENOUS

## 2017-05-13 MED ORDER — SODIUM CHLORIDE 0.9 % IV SOLN: 250.0000 mL | INTRAVENOUS | Status: DC | PRN

## 2017-05-13 MED ORDER — ASPIRIN 81 MG PO CHEW
81.0000 mg | CHEWABLE_TABLET | ORAL | Status: AC
  Administered 2017-05-14: 81 mg via ORAL

## 2017-05-13 MED ORDER — SODIUM CHLORIDE 0.9% FLUSH: 3.0000 mL | INTRAVENOUS | Status: DC | PRN

## 2017-05-13 MED ORDER — SODIUM CHLORIDE 0.9% FLUSH: 3.0000 mL | Freq: Two times a day (BID) | INTRAVENOUS | Status: DC

## 2017-05-13 NOTE — Progress Notes (Signed)
ANTICOAGULATION CONSULT NOTE- Follow-Up Consult  Pharmacy Consult for Heparin  Indication: atrial fibrillation/LV thrombus   Allergies  Allergen Reactions  . Bee Venom     UNSPECIFIED REACTION    Patient Measurements: Height: 6' (182.9 cm) Weight: 196 lb 10.4 oz (89.2 kg) IBW/kg (Calculated) : 77.6  Heparin dosing weight: 89 kg   Vital Signs: Temp: 98.5 F (36.9 C) (04/28 0442) Temp Source: Oral (04/28 0442) BP: 115/87 (04/28 0442) Pulse Rate: 139 (04/28 0442)  Labs: Recent Labs    05/11/17 0350 05/11/17 1558 05/12/17 0425 05/13/17 0436  HGB 13.3  --  13.1 13.9  HCT 40.7  --  39.7 41.9  PLT 335  --  335 340  LABPROT 14.7  --   --   --   INR 1.16  --   --   --   HEPARINUNFRC  --  0.49 0.43 0.57  CREATININE 1.83*  --  1.55* 1.39*   Assessment: 8 yoM with h/o HF admitted with volume overload and in cardiogenic shock. Started on heparin gtt for afib with RVR. No AC PTA - previously on rivaroxaban but fell, hit his head, and developed [facility] 11/18. Last head CT showed no increase hemorrhage. ECHO 4/8 showed LVEF 20-25% and large LV thrombus.   Pt had tooth extraction 4/25 per Dr.Kulinski.  Heparin resumed 4/26, no bleeding issues noted. CBC stable this am.  Heparin level therapeutic this am at 0.57.  Goal of Therapy:  Heparin level 0.3-0.7 units/ml Monitor platelets by anticoagulation protocol: Yes   Plan:  Continue heparin infusion 1550 units/hr Monitor daily HL, CBC, s/sx of bleeding  Continue to hold warfarin for plans for IABP/LVAD work-up.   Anothony Bursch L. Marcy Salvo, PharmD, MS PGY1 Pharmacy Resident Pager: [phone]

## 2017-05-13 NOTE — Plan of Care (Signed)
  Problem: Activity: Goal: Ability to return to baseline activity level will improve Outcome: Progressing  Pt ambulates in hall with steady gait

## 2017-05-13 NOTE — Progress Notes (Signed)
Patient ID: Benjamin Sherman, male   DOB: [birth year], 47 y.o.   MRN: [id number]     Advanced Heart Failure Rounding Note  Subjective:    Events:  -Admitted 04/21/17 with cardiogenic shock and atrial flutter.  -Central line placed. Initial co-ox 39%. -IABP placed 04/25/17-> 04/30/17 with persistent cardiogenic shock.  -DCCV atrial flutter 4/12.   - S/p multiple teeth extractions 05/11/17.   Doing well. Mouth pain improved. Co-ox 59% on milrinone 0.375 Creatinine stable 1.39. Denies SOB, orthopnea or PND. Weight down to 196.    Echo 4/8: LVEF 20-25% with LV apical clot. RV mild to moderately down.  Repeat echo 4/19 unchanged RV moderately down   Objective:   Weight Range:  Vital Signs:   Temp:  [98.3 F (36.8 C)-98.9 F (37.2 C)] 98.9 F (37.2 C) (04/28 0831) Pulse Rate:  [104-139] 104 (04/28 0831) Resp:  [18-20] 20 (04/28 0442) BP: (105-136)/(73-99) 105/74 (04/28 0831) SpO2:  [97 %-100 %] 100 % (04/28 0831) Last BM Date: 05/10/17  Weight change: Filed Weights   05/10/17 0415 05/11/17 0500 05/12/17 0417  Weight: 91.9 kg (202 lb 11.2 oz) 91.1 kg (200 lb 14.4 oz) 89.2 kg (196 lb 10.4 oz)   Intake/Output:   Intake/Output Summary (Last 24 hours) at 05/13/2017 0946 Last data filed at 05/13/2017 0836 Gross per 24 hour  Intake 952.04 ml  Output 2050 ml  Net -1097.96 ml    Physical Exam   General:  Well appearing. No resp difficulty HEENT: normal mild oral swelling Neck: supple. JVP 7-8. Carotids 2+ bilat; no bruits. No lymphadenopathy or thryomegaly appreciated. Cor: PMI laterally displaced. Regular rate & rhythm. +s3 Lungs: clear Abdomen: soft, nontender, nondistended. No hepatosplenomegaly. No bruits or masses. Good bowel sounds. Extremities: no cyanosis, clubbing, rash, edema Neuro: alert & orientedx3, cranial nerves grossly intact. moves all 4 extremities w/o difficulty. Affect pleasant  Telemetry    SR 90-110 Personally reviewed.   EKG   No new tracings.   Labs    Basic Metabolic Panel: Recent Labs  Lab 05/07/17 0531 05/08/17 0542 05/09/17 0414 05/10/17 0544 05/11/17 0350 05/12/17 0425 05/13/17 0436  NA 132* 133* 136 133* 133* 135 135  K 3.8 4.1 4.1 4.1 4.4 4.0 4.0  CL 99* 101 100* 103 98* 100* 99*  CO2 24 23 27 22 23 26 29   GLUCOSE 112* 114* 89 136* 155* 122* 105*  BUN 29* 30* 28* 34* 37* 45* 47*  CREATININE 1.62* 1.81* 1.70* 1.70* 1.83* 1.55* 1.39*  CALCIUM 8.8* 8.7* 9.0 8.9 9.6 9.6 9.3  MG 1.8 1.8 2.2 1.9 2.2  --   --    Liver Function Tests: Recent Labs  Lab 05/08/17 0542 05/11/17 0350  AST 24 27  ALT 19 18  ALKPHOS 81 84  BILITOT 0.9 1.1  PROT 7.6 9.2*  ALBUMIN 2.5* 3.0*   No results for input(s): LIPASE, AMYLASE in the last 168 hours. No results for input(s): AMMONIA in the last 168 hours.  CBC: Recent Labs  Lab 05/09/17 0414 05/10/17 0544 05/11/17 0350 05/12/17 0425 05/13/17 0436  WBC 5.7 6.5 12.4* 12.6* 10.5  HGB 12.0* 11.7* 13.3 13.1 13.9  HCT 37.4* 36.4* 40.7 39.7 41.9  MCV 86.4 86.3 86.0 85.7 85.5  PLT 290 288 335 335 340   Cardiac Enzymes: No results for input(s): CKTOTAL, CKMB, CKMBINDEX, TROPONINI in the last 168 hours. BNP: BNP (last 3 results) Recent Labs    11/06/16 1621 03/25/17 1401 04/21/17 1212  BNP 961.6* 835.4* 483.0*  ProBNP (last 3 results) No results for input(s): PROBNP in the last 8760 hours.  Other results:  Imaging: No results found.  Medications:    Scheduled Medications: . allopurinol  300 mg Oral Daily  . amiodarone  200 mg Oral BID  . Chlorhexidine Gluconate Cloth  6 each Topical Daily  . digoxin  0.125 mg Oral Daily  . feeding supplement (PRO-STAT SUGAR FREE 64)  30 mL Oral BID  . furosemide  80 mg Oral Daily  . insulin aspart  0-15 Units Subcutaneous TID WC  . insulin aspart  0-5 Units Subcutaneous QHS  . insulin glargine  10 Units Subcutaneous QHS  . nutrition supplement (JUVEN)  1 packet Oral BID BM  . sodium chloride flush  10-40 mL Intracatheter Q12H  .  sodium chloride flush  10-40 mL Intracatheter Q12H  . sodium chloride flush  3 mL Intravenous Q12H  . spironolactone  12.5 mg Oral Daily    Infusions: . sodium chloride Stopped (05/01/17 1001)  . sodium chloride Stopped (05/01/17 0800)  . sodium chloride    . heparin 1,550 Units/hr (05/13/17 0403)  . lactated ringers Stopped (05/10/17 1900)  . lactated ringers    . milrinone 0.375 mcg/kg/min (05/13/17 0403)    PRN Medications:    Assessment:   Benjamin Sherman is a 47 y.o. 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 eosinophilic cardiomyopathy, LV thrombus, hypothyroidism, DM2, CKD Stage II-III and traumatic [facility] 11/18 after fall.   Admitted with cardiogenic shock.  Plan/Discussion:    1. Acute on chronic systolic HF -> cardiogenic shock: ECHO 11/2016 EF 25-30% CMRI EF 22%. Bedside echo in HF clinic EF 20% in 3/19.  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 Did not respond to steroids. Admitted 4/6 with cardiogenic shock and volume overload co-ox 38%. Echo 04/23/17: LVEF 20-25% with large LV clot, RV mild to moderately down. - Coox 59% this am on milrinone 0.375 mcg/kg/min.  - Volume status looks good. Weight down  - Continue 80 mg lasix po. - Continue digoxin 0.125.  - Continue spiro 12.5 mg daily. - No ACE/ARB/b-blocker with shock - Echo reviewed personally with LVEF 20% moderate RV dysfunction   - We have been unable to wean inotropic support and even on fairly high-dose milrinone he remains very tenuous. Pt will likely die soon if there is no durable option for him. Not transplant candidate due to limited support. RV tenuous but may be sufficient.  - Plan for LVAD on Tuesday. He will need IABP back in tomorrow prior to surgery. I have placed on cath board.  2. Atrial flutter: New onset. S/p DCCV to NSR on 4/12.  - Remains in NSR s/p DCCV. Continue amio 200 mg BID.  - Continue heparin gtt pending VAD Tuesday. Discussed dosing with PharmD  personally. 3. AKI CKD Stage II-III: Likely ATN due to shock - Creatinine 1.39 today 4. H/o LV Thrombus - now recurrent on echo 04/23/17: Off Xarelto due to previous SAH.  - On heparin  5. DMII - Cover with SSI plus lantus.  - No change.  6. Hypothyroidism: TFTs OK 04/22/17.  - TSH elevated at 10.848 4/23. T4 and T3 normal.  No change.  7. RLE erythema/cellulitis - Started doxy 4/20. Resolved  - Has received steroids for gout flare. Uric acid 11.5 - On allopurinol  [name], MD  05/13/2017 9:46 AM   Advanced Heart Failure Team Pager [phone] (M-F; 7a - 4p)  Please contact [facility]  for night-coverage after hours (4p -7a ) and weekends on [email or web address]

## 2017-05-14 ENCOUNTER — Inpatient Hospital Stay (HOSPITAL_COMMUNITY): Payer: Medicaid Other

## 2017-05-14 ENCOUNTER — Encounter (HOSPITAL_COMMUNITY)
Admission: EM | Disposition: A | Discharge status: Home Health | Payer: Self-pay | Source: Home / Self Care | Attending: Internal Medicine

## 2017-05-14 ENCOUNTER — Encounter (HOSPITAL_COMMUNITY): Payer: Self-pay | Admitting: Internal Medicine

## 2017-05-14 DIAGNOSIS — I509 Heart failure, unspecified: Secondary | ICD-10-CM

## 2017-05-14 HISTORY — PX: RIGHT HEART CATH: CATH118263

## 2017-05-14 HISTORY — PX: IABP INSERTION: CATH118242

## 2017-05-14 LAB — BASIC METABOLIC PANEL
Anion gap: 10 (ref 5–15)
BUN: 49 mg/dL — ABNORMAL HIGH (ref 6–20)
CO2: 29 mmol/L (ref 22–32)
Calcium: 9.8 mg/dL (ref 8.9–10.3)
Chloride: 95 mmol/L — ABNORMAL LOW (ref 101–111)
Creatinine, Ser: 1.47 mg/dL — ABNORMAL HIGH (ref 0.61–1.24)
GFR calc Af Amer: >60 mL/min (ref >60)
GFR calc non Af Amer: 55 mL/min — ABNORMAL LOW (ref >60)
Glucose, Bld: 119 mg/dL — ABNORMAL HIGH (ref 65–99)
Potassium: 4.3 mmol/L (ref 3.5–5.1)
Sodium: 134 mmol/L — ABNORMAL LOW (ref 135–145)

## 2017-05-14 LAB — POCT I-STAT 3, VENOUS BLOOD GAS (G3P V)
Acid-Base Excess: 1 mmol/L (ref 0.0–2.0)
Acid-Base Excess: 2 mmol/L (ref 0.0–2.0)
Bicarbonate: 27.2 mmol/L (ref 20.0–28.0)
Bicarbonate: 28 mmol/L (ref 20.0–28.0)
O2 Saturation: 67 %
O2 Saturation: 68 %
TCO2: 29 mmol/L (ref 22–32)
TCO2: 29 mmol/L (ref 22–32)
pCO2, Ven: 48.9 mmHg (ref 44.0–60.0)
pCO2, Ven: 49.7 mmHg (ref 44.0–60.0)
pH, Ven: 7.354 (ref 7.250–7.430)
pH, Ven: 7.358 (ref 7.250–7.430)
pO2, Ven: 37 mmHg (ref 32.0–45.0)
pO2, Ven: 38 mmHg (ref 32.0–45.0)

## 2017-05-14 LAB — MAGNESIUM: Magnesium: 1.8 mg/dL (ref 1.7–2.4)

## 2017-05-14 LAB — CBC
HCT: 42.4 % (ref 39.0–52.0)
Hemoglobin: 14.1 g/dL (ref 13.0–17.0)
MCH: 28.3 pg (ref 26.0–34.0)
MCHC: 33.3 g/dL (ref 30.0–36.0)
MCV: 85.1 fL (ref 78.0–100.0)
Platelets: 314 10*3/uL (ref 150–400)
RBC: 4.98 MIL/uL (ref 4.22–5.81)
RDW: 15.5 % (ref 11.5–15.5)
WBC: 8.4 10*3/uL (ref 4.0–10.5)

## 2017-05-14 LAB — COOXEMETRY PANEL
Carboxyhemoglobin: 1.1 % (ref 0.5–1.5)
Methemoglobin: 1.1 % (ref 0.0–1.5)
O2 Saturation: 67.2 %
Total hemoglobin: 14.8 g/dL (ref 12.0–16.0)

## 2017-05-14 LAB — HEPARIN LEVEL (UNFRACTIONATED): Heparin Unfractionated: 0.56 IU/mL (ref 0.30–0.70)

## 2017-05-14 LAB — PREPARE RBC (CROSSMATCH)

## 2017-05-14 LAB — GLUCOSE, CAPILLARY
Glucose-Capillary: 82 mg/dL (ref 65–99)
Glucose-Capillary: 146 mg/dL — ABNORMAL HIGH (ref 65–99)
Glucose-Capillary: 155 mg/dL — ABNORMAL HIGH (ref 65–99)
Glucose-Capillary: 173 mg/dL — ABNORMAL HIGH (ref 65–99)
Glucose-Capillary: 119 mg/dL — ABNORMAL HIGH (ref 65–99)

## 2017-05-14 LAB — MRSA PCR SCREENING: MRSA by PCR: NEGATIVE

## 2017-05-14 LAB — FACTOR 5 LEIDEN

## 2017-05-14 SURGERY — IABP INSERTION
Anesthesia: LOCAL

## 2017-05-14 MED ORDER — MUPIROCIN 2 % EX OINT
1.0000 "application " | TOPICAL_OINTMENT | Freq: Two times a day (BID) | CUTANEOUS | Status: AC
  Administered 2017-05-14 (×2): 1 via NASAL
  Filled 2017-05-14 (×2): qty 22

## 2017-05-14 MED ORDER — SODIUM CHLORIDE 0.9 % IV SOLN
INTRAVENOUS | Status: AC
  Administered 2017-05-15: 1 [IU]/h via INTRAVENOUS
  Filled 2017-05-14: qty 1

## 2017-05-14 MED ORDER — TRANEXAMIC ACID (OHS) PUMP PRIME SOLUTION
2.0000 mg/kg | INTRAVENOUS | Status: DC
  Filled 2017-05-14: qty 1.73

## 2017-05-14 MED ORDER — CHLORHEXIDINE GLUCONATE CLOTH 2 % EX PADS
6.0000 | MEDICATED_PAD | Freq: Once | CUTANEOUS | Status: AC
  Administered 2017-05-14: 6 via TOPICAL

## 2017-05-14 MED ORDER — NITROGLYCERIN IN D5W 200-5 MCG/ML-% IV SOLN
0.0000 ug/min | INTRAVENOUS | Status: DC
  Filled 2017-05-14: qty 250

## 2017-05-14 MED ORDER — MILRINONE LACTATE IN DEXTROSE 20-5 MG/100ML-% IV SOLN
0.3000 ug/kg/min | INTRAVENOUS | Status: DC
Start: 2017-05-15 — End: 2017-05-14
  Filled 2017-05-14: qty 100

## 2017-05-14 MED ORDER — SODIUM CHLORIDE 0.9 % IV SOLN
INTRAVENOUS | Status: DC
  Filled 2017-05-14: qty 30

## 2017-05-14 MED ORDER — FUROSEMIDE 80 MG PO TABS: 80.0000 mg | ORAL_TABLET | Freq: Every day | ORAL | Status: DC

## 2017-05-14 MED ORDER — SODIUM CHLORIDE 0.9 % IV SOLN
600.0000 mg | INTRAVENOUS | Status: AC
  Administered 2017-05-15: 600 mg via INTRAVENOUS
  Filled 2017-05-14: qty 600

## 2017-05-14 MED ORDER — TRANEXAMIC ACID (OHS) BOLUS VIA INFUSION
15.0000 mg/kg | INTRAVENOUS | Status: AC
  Administered 2017-05-15: 1299 mg via INTRAVENOUS
  Filled 2017-05-14: qty 1299

## 2017-05-14 MED ORDER — SODIUM CHLORIDE 0.9 % IV SOLN
750.0000 mg | INTRAVENOUS | Status: DC
  Filled 2017-05-14: qty 750

## 2017-05-14 MED ORDER — FENTANYL CITRATE (PF) 100 MCG/2ML IJ SOLN
INTRAMUSCULAR | Status: DC | PRN
  Administered 2017-05-14 (×2): 25 ug via INTRAVENOUS

## 2017-05-14 MED ORDER — CHLORHEXIDINE GLUCONATE CLOTH 2 % EX PADS
6.0000 | MEDICATED_PAD | Freq: Once | CUTANEOUS | Status: AC
  Administered 2017-05-15: 6 via TOPICAL

## 2017-05-14 MED ORDER — ALPRAZOLAM 0.25 MG PO TABS: 0.2500 mg | ORAL_TABLET | ORAL | Status: DC | PRN

## 2017-05-14 MED ORDER — VASOPRESSIN 20 UNIT/ML IV SOLN
0.0400 [IU]/min | INTRAVENOUS | Status: DC
  Filled 2017-05-14: qty 2

## 2017-05-14 MED ORDER — DEXMEDETOMIDINE HCL IN NACL 400 MCG/100ML IV SOLN
0.1000 ug/kg/h | INTRAVENOUS | Status: DC
  Filled 2017-05-14: qty 100

## 2017-05-14 MED ORDER — SODIUM CHLORIDE 0.9 % IV SOLN
0.0000 ug/min | INTRAVENOUS | Status: DC
  Filled 2017-05-14: qty 2

## 2017-05-14 MED ORDER — TRANEXAMIC ACID (OHS) BOLUS VIA INFUSION
15.0000 mg/kg | INTRAVENOUS | Status: DC
  Filled 2017-05-14: qty 1299

## 2017-05-14 MED ORDER — DOPAMINE-DEXTROSE 3.2-5 MG/ML-% IV SOLN
0.0000 ug/kg/min | INTRAVENOUS | Status: DC
Start: 2017-05-15 — End: 2017-05-15
  Filled 2017-05-14: qty 250

## 2017-05-14 MED ORDER — BISACODYL 5 MG PO TBEC
5.0000 mg | DELAYED_RELEASE_TABLET | Freq: Once | ORAL | Status: AC
  Administered 2017-05-14: 5 mg via ORAL
  Filled 2017-05-14: qty 1

## 2017-05-14 MED ORDER — CEFUROXIME SODIUM 1.5 G IV SOLR
1.5000 g | INTRAVENOUS | Status: DC
  Filled 2017-05-14: qty 1.5

## 2017-05-14 MED ORDER — BISACODYL 5 MG PO TBEC: 5.0000 mg | DELAYED_RELEASE_TABLET | Freq: Once | ORAL | Status: DC

## 2017-05-14 MED ORDER — MAGNESIUM SULFATE 50 % IJ SOLN
40.0000 meq | INTRAMUSCULAR | Status: DC
Start: 2017-05-15 — End: 2017-05-14
  Filled 2017-05-14: qty 9.85

## 2017-05-14 MED ORDER — INSULIN REGULAR HUMAN 100 UNIT/ML IJ SOLN
INTRAMUSCULAR | Status: DC
  Filled 2017-05-14: qty 1

## 2017-05-14 MED ORDER — VANCOMYCIN HCL 1 G IV SOLR
1000.0000 mg | INTRAVENOUS | Status: AC
  Administered 2017-05-15: 1000 mg
  Filled 2017-05-14: qty 1000

## 2017-05-14 MED ORDER — TEMAZEPAM 15 MG PO CAPS: 15.0000 mg | ORAL_CAPSULE | Freq: Once | ORAL | Status: DC | PRN

## 2017-05-14 MED ORDER — CHLORHEXIDINE GLUCONATE 4 % EX LIQD: 60.0000 mL | Freq: Once | CUTANEOUS | Status: DC

## 2017-05-14 MED ORDER — SODIUM CHLORIDE 0.9 % IV SOLN: 250.0000 mL | INTRAVENOUS | Status: DC | PRN

## 2017-05-14 MED ORDER — NOREPINEPHRINE BITARTRATE 1 MG/ML IV SOLN
0.0000 ug/min | INTRAVENOUS | Status: DC
  Filled 2017-05-14: qty 4

## 2017-05-14 MED ORDER — HEPARIN (PORCINE) IN NACL 1000-0.9 UT/500ML-% IV SOLN
INTRAVENOUS | Status: AC
  Filled 2017-05-14: qty 500

## 2017-05-14 MED ORDER — NOREPINEPHRINE BITARTRATE 1 MG/ML IV SOLN
0.0000 ug/min | INTRAVENOUS | Status: AC
  Administered 2017-05-15: 2 ug/min via INTRAVENOUS
  Filled 2017-05-14: qty 4

## 2017-05-14 MED ORDER — TRANEXAMIC ACID 1000 MG/10ML IV SOLN
1.5000 mg/kg/h | INTRAVENOUS | Status: DC
  Filled 2017-05-14: qty 25

## 2017-05-14 MED ORDER — MIDAZOLAM HCL 2 MG/2ML IJ SOLN
INTRAMUSCULAR | Status: AC
  Filled 2017-05-14: qty 2

## 2017-05-14 MED ORDER — TRANEXAMIC ACID 1000 MG/10ML IV SOLN
1.5000 mg/kg/h | INTRAVENOUS | Status: AC
  Administered 2017-05-15: 1.5 mg/kg/h via INTRAVENOUS
  Filled 2017-05-14: qty 25

## 2017-05-14 MED ORDER — POTASSIUM CHLORIDE 2 MEQ/ML IV SOLN
80.0000 meq | INTRAVENOUS | Status: DC
  Filled 2017-05-14: qty 40

## 2017-05-14 MED ORDER — CHLORHEXIDINE GLUCONATE 0.12 % MT SOLN
15.0000 mL | Freq: Once | OROMUCOSAL | Status: AC
  Administered 2017-05-15: 15 mL via OROMUCOSAL
  Filled 2017-05-14: qty 15

## 2017-05-14 MED ORDER — VANCOMYCIN HCL 10 G IV SOLR
1250.0000 mg | INTRAVENOUS | Status: AC
  Administered 2017-05-15: 1500 mg via INTRAVENOUS
  Filled 2017-05-14: qty 1250

## 2017-05-14 MED ORDER — SODIUM CHLORIDE 0.9% FLUSH: 3.0000 mL | INTRAVENOUS | Status: DC | PRN

## 2017-05-14 MED ORDER — EPINEPHRINE PF 1 MG/ML IJ SOLN
0.0000 ug/min | INTRAMUSCULAR | Status: DC
  Filled 2017-05-14: qty 4

## 2017-05-14 MED ORDER — SODIUM CHLORIDE 0.9 % IV SOLN
1.5000 g | INTRAVENOUS | Status: AC
  Administered 2017-05-15: 750 g via INTRAVENOUS
  Administered 2017-05-15: 1.5 g via INTRAVENOUS
  Filled 2017-05-14: qty 1.5

## 2017-05-14 MED ORDER — VANCOMYCIN HCL 10 G IV SOLR
1500.0000 mg | INTRAVENOUS | Status: DC
  Filled 2017-05-14: qty 1500

## 2017-05-14 MED ORDER — EPINEPHRINE PF 1 MG/ML IJ SOLN
0.0000 ug/min | INTRAVENOUS | Status: AC
  Administered 2017-05-15: 2 ug/min via INTRAVENOUS
  Filled 2017-05-14: qty 4

## 2017-05-14 MED ORDER — FENTANYL CITRATE (PF) 100 MCG/2ML IJ SOLN
INTRAMUSCULAR | Status: AC
  Filled 2017-05-14: qty 2

## 2017-05-14 MED ORDER — VANCOMYCIN HCL 1 G IV SOLR
1000.0000 mg | INTRAVENOUS | Status: DC
Start: 2017-05-15 — End: 2017-05-14
  Filled 2017-05-14: qty 1000

## 2017-05-14 MED ORDER — DOBUTAMINE IN D5W 4-5 MG/ML-% IV SOLN: 2.0000 ug/kg/min | INTRAVENOUS | Status: DC

## 2017-05-14 MED ORDER — DOBUTAMINE IN D5W 4-5 MG/ML-% IV SOLN
2.0000 ug/kg/min | INTRAVENOUS | Status: DC
  Filled 2017-05-14: qty 250

## 2017-05-14 MED ORDER — ONDANSETRON HCL 4 MG/2ML IJ SOLN: 4.0000 mg | Freq: Four times a day (QID) | INTRAMUSCULAR | Status: DC | PRN

## 2017-05-14 MED ORDER — CEFUROXIME SODIUM 750 MG IJ SOLR
750.0000 mg | INTRAMUSCULAR | Status: DC
  Filled 2017-05-14: qty 750

## 2017-05-14 MED ORDER — MIDAZOLAM HCL 2 MG/2ML IJ SOLN
INTRAMUSCULAR | Status: DC | PRN
  Administered 2017-05-14 (×2): 1 mg via INTRAVENOUS

## 2017-05-14 MED ORDER — DIAZEPAM 2 MG PO TABS: 2.0000 mg | ORAL_TABLET | Freq: Once | ORAL | Status: DC

## 2017-05-14 MED ORDER — CHLORHEXIDINE GLUCONATE 0.12 % MT SOLN: 15.0000 mL | Freq: Once | OROMUCOSAL | Status: DC

## 2017-05-14 MED ORDER — LIDOCAINE HCL (PF) 1 % IJ SOLN
INTRAMUSCULAR | Status: AC
  Filled 2017-05-14: qty 30

## 2017-05-14 MED ORDER — SODIUM CHLORIDE 0.9% FLUSH
3.0000 mL | Freq: Two times a day (BID) | INTRAVENOUS | Status: DC
  Administered 2017-05-14: 3 mL via INTRAVENOUS

## 2017-05-14 MED ORDER — FLUCONAZOLE IN SODIUM CHLORIDE 400-0.9 MG/200ML-% IV SOLN
400.0000 mg | INTRAVENOUS | Status: AC
  Administered 2017-05-15: 400 mg via INTRAVENOUS
  Filled 2017-05-14: qty 200

## 2017-05-14 MED ORDER — HEPARIN (PORCINE) IN NACL 2-0.9 UNITS/ML
INTRAMUSCULAR | Status: AC | PRN
  Administered 2017-05-14 (×3): 500 mL

## 2017-05-14 MED ORDER — SODIUM CHLORIDE 0.9 % IV SOLN
600.0000 mg | INTRAVENOUS | Status: DC
  Filled 2017-05-14: qty 600

## 2017-05-14 MED ORDER — FLUCONAZOLE IN SODIUM CHLORIDE 400-0.9 MG/200ML-% IV SOLN
400.0000 mg | INTRAVENOUS | Status: DC
  Filled 2017-05-14: qty 200

## 2017-05-14 MED ORDER — DEXMEDETOMIDINE HCL IN NACL 400 MCG/100ML IV SOLN
0.1000 ug/kg/h | INTRAVENOUS | Status: AC
Start: 2017-05-15 — End: 2017-05-15
  Administered 2017-05-15: .5 ug/kg/h via INTRAVENOUS
  Filled 2017-05-14: qty 100

## 2017-05-14 MED ORDER — MAGNESIUM SULFATE 50 % IJ SOLN
40.0000 meq | INTRAMUSCULAR | Status: DC
  Filled 2017-05-14: qty 9.85

## 2017-05-14 MED ORDER — MILRINONE LACTATE IN DEXTROSE 20-5 MG/100ML-% IV SOLN: 0.3000 ug/kg/min | INTRAVENOUS | Status: DC

## 2017-05-14 MED ORDER — DOPAMINE-DEXTROSE 3.2-5 MG/ML-% IV SOLN
0.0000 ug/kg/min | INTRAVENOUS | Status: DC
  Filled 2017-05-14: qty 250

## 2017-05-14 MED ORDER — MUPIROCIN 2 % EX OINT: 1.0000 "application " | TOPICAL_OINTMENT | Freq: Two times a day (BID) | CUTANEOUS | Status: DC

## 2017-05-14 MED ORDER — ACETAMINOPHEN 325 MG PO TABS: 650.0000 mg | ORAL_TABLET | ORAL | Status: DC | PRN

## 2017-05-14 MED ORDER — LIDOCAINE HCL (PF) 1 % IJ SOLN
INTRAMUSCULAR | Status: DC | PRN
  Administered 2017-05-14: 15 mL
  Administered 2017-05-14: 5 mL

## 2017-05-14 SURGICAL SUPPLY — 13 items
BALLN IABP SENSA PLUS 8F 50CC (BALLOONS) ×2
BALLOON IABP SENS PLUS 8F 50CC (BALLOONS) IMPLANT
CATH SWAN GANZ VIP 7.5F (CATHETERS) ×1 IMPLANT
COVER PRB 48X5XTLSCP FOLD TPE (BAG) IMPLANT
COVER PROBE 5X48 (BAG) ×2
KIT HEART LEFT (KITS) ×2 IMPLANT
PACK CARDIAC CATHETERIZATION (CUSTOM PROCEDURE TRAY) ×2 IMPLANT
SHEATH AVANTI 11CM 8FR (SHEATH) ×1 IMPLANT
SLEEVE REPOSITIONING LENGTH 30 (MISCELLANEOUS) ×1 IMPLANT
TRANSDUCER W/STOPCOCK (MISCELLANEOUS) ×2 IMPLANT
TUBING ART PRESS 72  MALE/FEM (TUBING) ×1
TUBING ART PRESS 72 MALE/FEM (TUBING) IMPLANT
WIRE EMERALD 3MM-J .035X150CM (WIRE) ×1 IMPLANT

## 2017-05-14 NOTE — Progress Notes (Signed)
Notified by tech that patient had blood on pillow when she entered the room. Patient was asleep.  Moderate size clot on inside of right cheek and new swelling noted on right side. Contacted pharmacy and cardiology. Per doctor- will draw labs and stop heparin until resulted. Pt given salt water rinse, gauze and ice pack will continue to monitor.

## 2017-05-14 NOTE — Progress Notes (Signed)
4 Days Post-Op Procedure(s) (LRB): Extraction of 2-6, 8-15, 17, 19-24, 27- 30, and 32 with alveoloplasty, and maxillary and mandibular lateral exotoses reductions. (N/A) Subjective: Bleeding from mouth on 1550 u heparin/ hr after multiple dental extractions 4 days ago Non- ischemic CM with EF .20, LV diameter 6.5 cm and moderate RV dysfunction, moderate TR  Plan pacement of IABP and RHC today and placement of Heartmate 3 VAD tomorrow.  The patient understands he is not a transplant candidate because he is not on a transplant waiting list. He understands the VAD implant is recommended as best therapy for improved survival, quality of life and the risk of cardiac death without VAD is extremely high in the near future.  I discussed the details of VAD implantation with the patient as well as risks of major morbidities and mortality. He understands and agrees to proceed with surgery Objective: Vital signs in last 24 hours: Temp:  [97.7 F (36.5 C)-99.8 F (37.7 C)] 98.2 F (36.8 C) (04/29 0750) Pulse Rate:  [99-115] 101 (04/29 0750) Cardiac Rhythm: Sinus tachycardia (04/29 0929) Resp:  [16-19] 19 (04/29 0750) BP: (106-117)/(81-85) 112/82 (04/29 0750) SpO2:  [98 %-100 %] 100 % (04/29 0425) Weight:  [190 lb 14.4 oz (86.6 kg)] 190 lb 14.4 oz (86.6 kg) (04/29 0425)  Hemodynamic parameters for last 24 hours: CVP:  [2 mmHg-7 mmHg] 7 mmHg  Intake/Output from previous day: 04/28 0701 - 04/29 0700 In: 793.1 [P.O.:120; I.V.:673.1] Out: 2450 [Urine:2450] Intake/Output this shift: Total I/O In: 82.7 [I.V.:82.7] Out: 250 [Urine:250]       Exam    General- alert and comfortable. Spitting up blood from recent dental extractions    Neck- no JVD, no cervical adenopathy palpable, no carotid bruit   Lungs- clear without rales, wheezes   Cor- regular rate and rhythm, no murmur , gallop   Abdomen- soft, non-tender   Extremities - warm, non-tender, minimal edema   Neuro- oriented, appropriate, no  focal weakness   Lab Results: Recent Labs    05/13/17 0436 05/14/17 0227  WBC 10.5 8.4  HGB 13.9 14.1  HCT 41.9 42.4  PLT 340 314   BMET:  Recent Labs    05/13/17 0436 05/14/17 0227  NA 135 134*  K 4.0 4.3  CL 99* 95*  CO2 29 29  GLUCOSE 105* 119*  BUN 47* 49*  CREATININE 1.39* 1.47*  CALCIUM 9.3 9.8    PT/INR: No results for input(s): LABPROT, INR in the last 72 hours. ABG    Component Value Date/Time   PHART 7.437 04/25/2017 1712   HCO3 19.9 (L) 04/25/2017 1712   TCO2 21 (L) 04/25/2017 1712   ACIDBASEDEF 3.0 (H) 04/25/2017 1712   O2SAT 67.2 05/14/2017 0500   CBG (last 3)  Recent Labs    05/13/17 2121 05/14/17 0158 05/14/17 0741  GLUCAP 82 146* 155*    Assessment/Plan: S/P Procedure(s) (LRB): Extraction of 2-6, 8-15, 17, 19-24, 27- 30, and 32 with alveoloplasty, and maxillary and mandibular lateral exotoses reductions. (N/A)  ballon pump , RHC today then implant HM 3 tomorrow Hold heparin until surgery   LOS: 23 days    Benjamin Sherman 05/14/2017

## 2017-05-14 NOTE — Progress Notes (Signed)
CSW met at bedside with patient and his sister Glenard Haring. Patient in ICU with IABP and states he is ready for surgery tomorrow. Patient's sister asked appropriate questions and stated she and patient's friend Gene are working on long term housing and ongoing social needs for successful recovery. Sister inquired about meeting with VAD Coordinator to review plan for tomorrow and CSW spoke with Tanda Rockers, VAD Coordinator about meeting at bedside with caregivers today.  Patient and caregivers appear very positive and ready for LVAD implantation tomorrow. CSW will follow for supportive needs throughout implant hospitalization. Raquel Sarna, [place], [place]

## 2017-05-14 NOTE — Progress Notes (Signed)
Benjamin Sherman has been discussed with the VAD Medical Review board on 05/07/17. The team feels as if the patient is a good candidate for Destination LVAD therapy. Pt is not a candidate for transplant this time due to limited social support. The patient meets criteria for a LVAD implant as listed below:  1)  Has failed to respond to optimal medical management (including beta-blockers and ACE inhibitors if tolerated) for at least 45 of the last 60 days, or have been balloon dependent for 7 days, or IV inotrope dependent for 14 days; and,       *On Inotropes Milrinone 0.50mg/kg/min started 04/21/17  2)  Has a left ventricular ejection fraction (LVEF) < 25% and, have demonstrated functional limitation with a peak oxygen consumption of <14 ml/kg/min unless balloon pump or inotrope dependent or physically unable to perform the test.         *EF   By echo (05/04/17)  20-25%      3)  Social work and palliative care evaluations demonstrate appropriate support system in place for discharge to home with a VAD and that end of life discussions have taken place. Both services have expressed no concern regarding patient's candidacy.         *Social work consult JRaquel Sarna4/23/19        *Palliative Care Consult 43/84/53AVinie Sill NP  4)  Primary caretaker identified that can be taught along with the patient how to manage        the VAD equipment.        *Name: Angel-sister and [place]friend  5)  Deemed appropriate by our financial coordinator: 05/14/17        Prior approval: does not require  6)  VAD Coordinator, SJudson Rochhas met with patient and caregiver, shown them the VAD equipment and discussed with the patient and caregiver about lifestyle changes necessary for success on mechanical circulatory device.        *Met with AGlenard Haring Gene and Mr. WHumeon 05/14/17.       *Consent for VAD Evaluation/Caregiver Agreement/HIPPA Release/Photo Release signed on 05/04/17  7)  [facility] and felt to be an appropriate candidate.    8)  Intermacs profile: 2  INTERMACS 1: Critical cardiogenic shock describes a patient who is "crashing and burning", in which a patient has life-threatening hypotension and rapidly escalating inotropic pressor support, with critical organ hypoperfusion often confirmed by worsening acidosis and lactate levels.  INTERMACS 2: Progressive decline describes a patient who has been demonstrated "dependent" on inotropic support but nonetheless shows signs of continuing deterioration in nutrition, renal function, fluid retention, or other major status indicator. Patient profile 2 can also describe a patient with refractory volume overload, perhaps with evidence of impaired perfusion, in whom inotropic infusions cannot be maintained due to tachyarrhythmias, clinical ischemia, or other intolerance.  INTERMACS 3: Stable but inotrope dependent describes a patient who is clinically stable on mild-moderate doses of intravenous inotropes (or has a temporary circulatory support device) after repeated documentation of failure to wean without symptomatic hypotension, worsening symptoms, or progressive organ dysfunction (usually renal). It is critical to monitor nutrition, renal function, fluid balance, and overall status carefully in order to distinguish between a  patient who is truly stable at Patient Profile 3 and a patient who has unappreciated decline rendering them Patient Profile 2. This patient may be either at home or in the hospital.   INTERMACS 4: Resting symptoms describes a patient  who is at home on oral therapy but frequently has symptoms of congestion at rest or with activities of daily living (ADL). He or she may have orthopnea, shortness of breath during ADL such as dressing or bathing, gastrointestinal symptoms (abdominal discomfort, nausea, poor appetite), disabling ascites or severe lower extremity edema. This patient should be  carefully considered for more intensive management and surveillance programs, which may in some cases, reveal poor compliance that would compromise outcomes with any therapy.  .  INTERMACS 5: Exertion Intolerant describes a patient who is comfortable at rest but unable to engage in any activity, living predominantly within the house or housebound. This patient has no congestive symptoms, but may have chronically elevated volume status, frequently with renal dysfunction, and may be characterized as exercise intolerant.   INTERMACS 6: Exertion Limited also describes a patient who is comfortable at rest without evidence of fluid overload, but who is able to do some mild activity. Activities of daily living are comfortable and minor activities outside the home such as visiting friends or going to a restaurant can be performed, but fatigue results within a few minutes of any meaningful physical exertion. This patient has occasional episodes of worsening symptoms and is likely to have had a hospitalization for heart failure within the past year.   INTERMACS 7: Advanced NYHA Class 3 describes a patient who is clinically stable with a reasonable level of comfortable activity, despite history of previous decompensation that is not recent. This patient is usually able to walk more than a block. Any decompensation requiring intravenous diuretics or hospitalization within the previous month should make this person a Patient Profile 6 or lower.    9)  NYHA Class: IV  Tanda Rockers RN, BSN VAD Coordinator 24/7 Pager [phone] .

## 2017-05-14 NOTE — H&P (View-Only) (Signed)
Patient ID: Benjamin Sherman, male   DOB: 04-Oct-1970, 46 y.o.   MRN: [id number]     Advanced Heart Failure Rounding Note  Subjective:    Events:  -Admitted 04/21/17 with cardiogenic shock and atrial flutter.  -Central line placed. Initial co-ox 39%. -IABP placed 04/25/17-> 04/30/17 with persistent cardiogenic shock.  -DCCV atrial flutter 4/12.   - S/p multiple teeth extractions 05/11/17.   Co-ox 67% on milrinone 0.375. Creatinine stable 1.47. Weight down ?6 lbs. CVP 1  Had a blood clot out of mouth overnight. Hemoglobin stable 14.1. Heparin paused briefly, but now restarted.  No CP, SOB, dizziness. Noticed increased swelling with some bleeding from mouth overnight. Pain is ok  No orthopnea or PND.   IABP today. LVAD tomorrow.  Echo 4/8: LVEF 20-25% with LV apical clot. RV mild to moderately down.  Repeat echo 4/19 unchanged RV moderately down   Objective:   Weight Range:  Vital Signs:   Temp:  [97.7 F (36.5 C)-99.8 F (37.7 C)] 99 F (37.2 C) (04/29 0425) Pulse Rate:  [99-115] 99 (04/29 0425) Resp:  [16-19] 18 (04/29 0425) BP: (105-117)/(74-85) 106/82 (04/29 0425) SpO2:  [98 %-100 %] 100 % (04/29 0425) Weight:  [190 lb 14.4 oz (86.6 kg)] 190 lb 14.4 oz (86.6 kg) (04/29 0425) Last BM Date: 05/10/17  Weight change: Filed Weights   05/11/17 0500 05/12/17 0417 05/14/17 0425  Weight: 200 lb 14.4 oz (91.1 kg) 196 lb 10.4 oz (89.2 kg) 190 lb 14.4 oz (86.6 kg)   Intake/Output:   Intake/Output Summary (Last 24 hours) at 05/14/2017 0707 Last data filed at 05/14/2017 0616 Gross per 24 hour  Intake 793.05 ml  Output 2450 ml  Net -1656.95 ml    Physical Exam   CVP 1 General: Well appearing. No resp difficulty. HEENT: ~1.5 cm blood clot along RU gums. Anicteric Neck: Supple. JVP flat. Carotids 2+ bilat; no bruits. No thyromegaly or nodule noted. Cor: PMI nondisplaced. RRR, +S3 Lungs: CTAB, normal effort. No wheeze Abdomen: Soft, non-tender, non-distended, no HSM. No bruits  or masses. +BS  Extremities: No cyanosis, clubbing, or rash. R and LLE no edema.  Neuro: alert & oriented x 3, cranial nerves grossly intact. moves all 4 extremities w/o difficulty. Affect pleasant   Telemetry    NSR 90s. Personally reviewed.   EKG   No new tracings.   Labs    Basic Metabolic Panel: Recent Labs  Lab 05/08/17 0542 05/09/17 0414 05/10/17 0544 05/11/17 0350 05/12/17 0425 05/13/17 0436 05/14/17 0227  NA 133* 136 133* 133* 135 135 134*  K 4.1 4.1 4.1 4.4 4.0 4.0 4.3  CL 101 100* 103 98* 100* 99* 95*  CO2 23 27 22 23 26 29 29   GLUCOSE 114* 89 136* 155* 122* 105* 119*  BUN 30* 28* 34* 37* 45* 47* 49*  CREATININE 1.81* 1.70* 1.70* 1.83* 1.55* 1.39* 1.47*  CALCIUM 8.7* 9.0 8.9 9.6 9.6 9.3 9.8  MG 1.8 2.2 1.9 2.2  --   --  1.8   Liver Function Tests: Recent Labs  Lab 05/08/17 0542 05/11/17 0350  AST 24 27  ALT 19 18  ALKPHOS 81 84  BILITOT 0.9 1.1  PROT 7.6 9.2*  ALBUMIN 2.5* 3.0*   No results for input(s): LIPASE, AMYLASE in the last 168 hours. No results for input(s): AMMONIA in the last 168 hours.  CBC: Recent Labs  Lab 05/10/17 0544 05/11/17 0350 05/12/17 0425 05/13/17 0436 05/14/17 0227  WBC 6.5 12.4* 12.6* 10.5 8.4  HGB  11.7* 13.3 13.1 13.9 14.1  HCT 36.4* 40.7 39.7 41.9 42.4  MCV 86.3 86.0 85.7 85.5 85.1  PLT 288 335 335 340 314   Cardiac Enzymes: No results for input(s): CKTOTAL, CKMB, CKMBINDEX, TROPONINI in the last 168 hours. BNP: BNP (last 3 results) Recent Labs    11/06/16 1621 03/25/17 1401 04/21/17 1212  BNP 961.6* 835.4* 483.0*   ProBNP (last 3 results) No results for input(s): PROBNP in the last 8760 hours.  Other results:  Imaging: No results found.  Medications:    Scheduled Medications: . allopurinol  300 mg Oral Daily  . amiodarone  200 mg Oral BID  . Chlorhexidine Gluconate Cloth  6 each Topical Daily  . digoxin  0.125 mg Oral Daily  . feeding supplement (PRO-STAT SUGAR FREE 64)  30 mL Oral BID  .  furosemide  80 mg Oral Daily  . insulin aspart  0-15 Units Subcutaneous TID WC  . insulin aspart  0-5 Units Subcutaneous QHS  . insulin glargine  10 Units Subcutaneous QHS  . nutrition supplement (JUVEN)  1 packet Oral BID BM  . sodium chloride flush  10-40 mL Intracatheter Q12H  . spironolactone  12.5 mg Oral Daily    Infusions: . sodium chloride Stopped (05/01/17 1001)  . sodium chloride 10 mL/hr at 05/14/17 0616  . heparin 1,550 Units/hr (05/14/17 0616)  . lactated ringers Stopped (05/10/17 1900)  . lactated ringers    . milrinone 0.375 mcg/kg/min (05/14/17 4098)    PRN Medications:    Assessment:   Benjamin Sherman is a 47 y.o. 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 eosinophilic cardiomyopathy, LV thrombus, hypothyroidism, DM2, CKD Stage II-III and traumatic [facility] 11/18 after fall.   Admitted with cardiogenic shock.  Plan/Discussion:    1. Acute on chronic systolic HF -> cardiogenic shock: ECHO 11/2016 EF 25-30% CMRI EF 22%. Bedside echo in HF clinic EF 20% in 3/19.  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 Did not respond to steroids. Admitted 4/6 with cardiogenic shock and volume overload co-ox 38%. Echo 04/23/17: LVEF 20-25% with large LV clot, RV mild to moderately down. - Coox 67% this am on milrinone 0.375 mcg/kg/min.  - Volume status looks good. Down 6 lbs and CVP is 1.  - Hold lasix this am with CVP down and NPO for IABP.  - Continue digoxin 0.125.  - Continue spiro 12.5 mg daily. - No ACE/ARB/b-blocker with shock - Echo reviewed personally with LVEF 20% moderate RV dysfunction   - We have been unable to wean inotropic support and even on fairly high-dose milrinone he remains very tenuous. Pt will likely die soon if there is no durable option for him. Not transplant candidate due to limited support. RV tenuous but may be sufficient.  - Plan for LVAD on Tuesday. He is on the cath board for IABP today. Will transfer to [facility]  afterward. 2. Atrial flutter: New onset. S/p DCCV to NSR on 4/12.  - Remains in NSR s/p DCCV. Continue amio 200 mg BID.  - Continue heparin gtt pending VAD Tuesday. Discussed dosing with PharmD personally. 3. AKI CKD Stage II-III: Likely ATN due to shock - Creatinine 1.47 today 4. H/o LV Thrombus - now recurrent on echo 04/23/17: Off Xarelto due to previous SAH.  - On heparin. No change 5. DMII - Cover with SSI plus lantus.  - No change 6. Hypothyroidism: TFTs OK 04/22/17.  - TSH elevated at 10.848 4/23. T4 and T3 normal.  No change 7. RLE erythema/cellulitis - Started doxy 4/20. Resolved.  - Has received steroids for gout flare. Uric acid 11.5 - On allopurinol 8. Blood clot in mouth - Hemoglobin stable. Continue heparin. Will discuss with MD.    Alford Highland, NP  05/14/2017 7:07 AM   Advanced Heart Failure Team Pager [phone] (M-F; 7a - 4p)  Please contact [facility] for night-coverage after hours (4p -7a ) and weekends on [email or web address]  Patient seen and examined with the above-signed Advanced Practice Provider and/or Housestaff. I personally reviewed laboratory data, imaging studies and relevant notes. I independently examined the patient and formulated the important aspects of the plan. I have edited the note to reflect any of my changes or salient points. I have personally discussed the plan with the patient and/or family.  Stable on current dose of milrinone Co-ox and CVP improving steadily. Having some oral bleeding with heparin. Maintaining NSR. Will do quick bedside echo today. If EF still markedly depressed will plan IABP placement today with VAD tomorrow.   [name], MD  9:11 AM

## 2017-05-14 NOTE — Progress Notes (Addendum)
Patient ID: Benjamin Sherman, male   DOB: 02/19/1970, 47 y.o.   MRN: [id number]     Advanced Heart Failure Rounding Note  Subjective:    Events:  -Admitted 04/21/17 with cardiogenic shock and atrial flutter.  -Central line placed. Initial co-ox 39%. -IABP placed 04/25/17-> 04/30/17 with persistent cardiogenic shock.  -DCCV atrial flutter 4/12.   - S/p multiple teeth extractions 05/11/17.   Co-ox 67% on milrinone 0.375. Creatinine stable 1.47. Weight down ?6 lbs. CVP 1  Had a blood clot out of mouth overnight. Hemoglobin stable 14.1. Heparin paused briefly, but now restarted.  No CP, SOB, dizziness. Noticed increased swelling with some bleeding from mouth overnight. Pain is ok  No orthopnea or PND.   IABP today. LVAD tomorrow.  Echo 4/8: LVEF 20-25% with LV apical clot. RV mild to moderately down.  Repeat echo 4/19 unchanged RV moderately down   Objective:   Weight Range:  Vital Signs:   Temp:  [97.7 F (36.5 C)-99.8 F (37.7 C)] 99 F (37.2 C) (04/29 0425) Pulse Rate:  [99-115] 99 (04/29 0425) Resp:  [16-19] 18 (04/29 0425) BP: (105-117)/(74-85) 106/82 (04/29 0425) SpO2:  [98 %-100 %] 100 % (04/29 0425) Weight:  [190 lb 14.4 oz (86.6 kg)] 190 lb 14.4 oz (86.6 kg) (04/29 0425) Last BM Date: 05/10/17  Weight change: Filed Weights   05/11/17 0500 05/12/17 0417 05/14/17 0425  Weight: 200 lb 14.4 oz (91.1 kg) 196 lb 10.4 oz (89.2 kg) 190 lb 14.4 oz (86.6 kg)   Intake/Output:   Intake/Output Summary (Last 24 hours) at 05/14/2017 0707 Last data filed at 05/14/2017 0616 Gross per 24 hour  Intake 793.05 ml  Output 2450 ml  Net -1656.95 ml    Physical Exam   CVP 1 General: Well appearing. No resp difficulty. HEENT: ~1.5 cm blood clot along RU gums. Anicteric Neck: Supple. JVP flat. Carotids 2+ bilat; no bruits. No thyromegaly or nodule noted. Cor: PMI nondisplaced. RRR, +S3 Lungs: CTAB, normal effort. No wheeze Abdomen: Soft, non-tender, non-distended, no HSM. No bruits  or masses. +BS  Extremities: No cyanosis, clubbing, or rash. R and LLE no edema.  Neuro: alert & oriented x 3, cranial nerves grossly intact. moves all 4 extremities w/o difficulty. Affect pleasant   Telemetry    NSR 90s. Personally reviewed.   EKG   No new tracings.   Labs    Basic Metabolic Panel: Recent Labs  Lab 05/08/17 0542 05/09/17 0414 05/10/17 0544 05/11/17 0350 05/12/17 0425 05/13/17 0436 05/14/17 0227  NA 133* 136 133* 133* 135 135 134*  K 4.1 4.1 4.1 4.4 4.0 4.0 4.3  CL 101 100* 103 98* 100* 99* 95*  CO2 23 27 22 23 26 29 29  GLUCOSE 114* 89 136* 155* 122* 105* 119*  BUN 30* 28* 34* 37* 45* 47* 49*  CREATININE 1.81* 1.70* 1.70* 1.83* 1.55* 1.39* 1.47*  CALCIUM 8.7* 9.0 8.9 9.6 9.6 9.3 9.8  MG 1.8 2.2 1.9 2.2  --   --  1.8   Liver Function Tests: Recent Labs  Lab 05/08/17 0542 05/11/17 0350  AST 24 27  ALT 19 18  ALKPHOS 81 84  BILITOT 0.9 1.1  PROT 7.6 9.2*  ALBUMIN 2.5* 3.0*   No results for input(s): LIPASE, AMYLASE in the last 168 hours. No results for input(s): AMMONIA in the last 168 hours.  CBC: Recent Labs  Lab 05/10/17 0544 05/11/17 0350 05/12/17 0425 05/13/17 0436 05/14/17 0227  WBC 6.5 12.4* 12.6* 10.5 8.4  HGB   11.7* 13.3 13.1 13.9 14.1  HCT 36.4* 40.7 39.7 41.9 42.4  MCV 86.3 86.0 85.7 85.5 85.1  PLT 288 335 335 340 314   Cardiac Enzymes: No results for input(s): CKTOTAL, CKMB, CKMBINDEX, TROPONINI in the last 168 hours. BNP: BNP (last 3 results) Recent Labs    11/06/16 1621 03/25/17 1401 04/21/17 1212  BNP 961.6* 835.4* 483.0*   ProBNP (last 3 results) No results for input(s): PROBNP in the last 8760 hours.  Other results:  Imaging: No results found.  Medications:    Scheduled Medications: . allopurinol  300 mg Oral Daily  . amiodarone  200 mg Oral BID  . Chlorhexidine Gluconate Cloth  6 each Topical Daily  . digoxin  0.125 mg Oral Daily  . feeding supplement (PRO-STAT SUGAR FREE 64)  30 mL Oral BID  .  furosemide  80 mg Oral Daily  . insulin aspart  0-15 Units Subcutaneous TID WC  . insulin aspart  0-5 Units Subcutaneous QHS  . insulin glargine  10 Units Subcutaneous QHS  . nutrition supplement (JUVEN)  1 packet Oral BID BM  . sodium chloride flush  10-40 mL Intracatheter Q12H  . spironolactone  12.5 mg Oral Daily    Infusions: . sodium chloride Stopped (05/01/17 1001)  . sodium chloride 10 mL/hr at 05/14/17 0616  . heparin 1,550 Units/hr (05/14/17 0616)  . lactated ringers Stopped (05/10/17 1900)  . lactated ringers    . milrinone 0.375 mcg/kg/min (05/14/17 0616)    PRN Medications:    Assessment:   Benjamin Sherman is a 47 y.o. 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 eosinophilic cardiomyopathy, LV thrombus, hypothyroidism, DM2, CKD Stage II-III and traumatic SAH 11/18 after fall.   Admitted with cardiogenic shock.  Plan/Discussion:    1. Acute on chronic systolic HF -> cardiogenic shock: ECHO 11/2016 EF 25-30% CMRI EF 22%. Bedside echo in HF clinic EF 20% in 3/19.  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 Did not respond to steroids. Admitted 4/6 with cardiogenic shock and volume overload co-ox 38%. Echo 04/23/17: LVEF 20-25% with large LV clot, RV mild to moderately down. - Coox 67% this am on milrinone 0.375 mcg/kg/min.  - Volume status looks good. Down 6 lbs and CVP is 1.  - Hold lasix this am with CVP down and NPO for IABP.  - Continue digoxin 0.125.  - Continue spiro 12.5 mg daily. - No ACE/ARB/b-blocker with shock - Echo reviewed personally with LVEF 20% moderate RV dysfunction   - We have been unable to wean inotropic support and even on fairly high-dose milrinone he remains very tenuous. Pt will likely die soon if there is no durable option for him. Not transplant candidate due to limited support. RV tenuous but may be sufficient.  - Plan for LVAD on Tuesday. He is on the cath board for IABP today. Will transfer to [facility]  afterward. 2. Atrial flutter: New onset. S/p DCCV to NSR on 4/12.  - Remains in NSR s/p DCCV. Continue amio 200 mg BID.  - Continue heparin gtt pending VAD Tuesday. Discussed dosing with PharmD personally. 3. AKI CKD Stage II-III: Likely ATN due to shock - Creatinine 1.47 today 4. H/o LV Thrombus - now recurrent on echo 04/23/17: Off Xarelto due to previous SAH.  - On heparin. No change 5. DMII - Cover with SSI plus lantus.  - No change 6. Hypothyroidism: TFTs OK 04/22/17.  - TSH elevated at 10.848 4/23. T4 and T3 normal.   No change 7. RLE erythema/cellulitis - Started doxy 4/20. Resolved.  - Has received steroids for gout flare. Uric acid 11.5 - On allopurinol 8. Blood clot in mouth - Hemoglobin stable. Continue heparin. Will discuss with MD.    Ashley M Smith, NP  05/14/2017 7:07 AM   Advanced Heart Failure Team Pager [phone] (M-F; 7a - 4p)  Please contact [facility] for night-coverage after hours (4p -7a ) and weekends on [email or web address]  Patient seen and examined with the above-signed Advanced Practice Provider and/or Housestaff. I personally reviewed laboratory data, imaging studies and relevant notes. I independently examined the patient and formulated the important aspects of the plan. I have edited the note to reflect any of my changes or salient points. I have personally discussed the plan with the patient and/or family.  Stable on current dose of milrinone Co-ox and CVP improving steadily. Having some oral bleeding with heparin. Maintaining NSR. Will do quick bedside echo today. If EF still markedly depressed will plan IABP placement today with VAD tomorrow.   Zavion Sleight, MD  9:11 AM    

## 2017-05-14 NOTE — Interval H&P Note (Signed)
History and Physical Interval Note:  05/14/2017 11:45 AM  Benjamin Sherman  has presented today for surgery, with the diagnosis of cardiogenic shock  The various methods of treatment have been discussed with the patient and family. After consideration of risks, benefits and other options for treatment, the patient has consented to  Procedure(s): IABP INSERTION (N/A) and swan ganz insertion/RHC as a surgical intervention .  The patient's history has been reviewed, patient examined, no change in status, stable for surgery.  I have reviewed the patient's chart and labs.  Questions were answered to the patient's satisfaction.     Daniel Bensimhon

## 2017-05-14 NOTE — Consult Note (Signed)
Anesthesiology Note:  47 year old male with severe nonischemic cardiomyopathy scheduled for Heartmate 3  LVAD 4/30 for destination therapy.  Problems: 1. Nonischemic Cardiomyopathy: Admitted 04/21/17 with cardiogenic shock and a flutter. CoX 39% lactate 4.6  mmol/L.  EF 20-25%,  Moderate RV systolic dysfunction IABP placed 04/25/17- 04/30/17. IABP reinserted 05/14/17 on milrinone 0.375 mcg/kg/min 2. LV apical thrombus (chronic) seen on Echo 05/04/17. 2.3 X 1.6 cm on heparin. . 3. A. Flutter- maintaining SR since DCCV 04/27/17. 4. Renal insufficiency- Cr 1.65 on admission now 1.47. 5. Poor dentition: S/P full mouth extraction 05/10/17. Tolerated GA well. Grade 1 view 7.5 ETT Miller 3 blade. 6. Type 2 DM 7. Swollen, erythematous R. Ankle probable gout, uric acid 11.5 4/19. Now improved 8. Smoking history quit  9. Oral Bleeding noted S/P dental extractions and on heparin  Studies: R. Heart Cath: 05/14/17: RA: 2  RV: 23/4  PA: 21/14  CO/CI: 4.5/2.2 PA Sat 67%  Echo 05/04/17: LVEDD: 64 mm LV EF: 20-25 with diffuse LV hypokinesis, apical thrombus as noted Aortic Valve: Trivial AI Mitral Valve: mild/moderate MR RV: Mildly dilated systolic function moderately reduced Tricuspid Valve: Structurally normal, moderate TR Atrial Septum: No PFO  L. Heart cath 11/10/17: Normal coronaries  Labs: Na- 134 K- 4.3 BUN/Cr.- 49/1.47 glucose- 174 WBC- 8,400, H/H- 14.1/42.4 Platelets- 314,000  Pleasant adult male in NAD VS: T- 36.4 BP- 127/84 HR- 88 RR- 20 O2 Sat 97% on RA PA Mean 18 CVP- 3 Mouth: edentulous- no claots or active bleeding Heart RRR with S3, S4 Lungs- clear anteriorly  47 year old male with end-stage non-ischemic cardiomyopathy. His condition appears to be optimized for LVAD placement. Anesthetic plan discussed with patient, all questions answered.  Kipp Brood

## 2017-05-15 ENCOUNTER — Inpatient Hospital Stay (HOSPITAL_COMMUNITY)
Admission: EM | Disposition: A | Discharge status: Home Health | Payer: Self-pay | Source: Home / Self Care | Attending: Internal Medicine

## 2017-05-15 ENCOUNTER — Inpatient Hospital Stay (HOSPITAL_COMMUNITY): Payer: Medicaid Other

## 2017-05-15 ENCOUNTER — Inpatient Hospital Stay (HOSPITAL_COMMUNITY): Payer: Medicaid Other | Admitting: Anesthesiology

## 2017-05-15 DIAGNOSIS — Z95811 Presence of heart assist device: Secondary | ICD-10-CM

## 2017-05-15 HISTORY — PX: TEE WITHOUT CARDIOVERSION: SHX5443

## 2017-05-15 HISTORY — PX: INSERTION OF IMPLANTABLE LEFT VENTRICULAR ASSIST DEVICE: SHX5866

## 2017-05-15 LAB — POCT I-STAT, CHEM 8
BUN: 35 mg/dL — ABNORMAL HIGH (ref 6–20)
BUN: 31 mg/dL — ABNORMAL HIGH (ref 6–20)
BUN: 35 mg/dL — ABNORMAL HIGH (ref 6–20)
BUN: 32 mg/dL — ABNORMAL HIGH (ref 6–20)
BUN: 32 mg/dL — ABNORMAL HIGH (ref 6–20)
BUN: 27 mg/dL — ABNORMAL HIGH (ref 6–20)
BUN: 27 mg/dL — ABNORMAL HIGH (ref 6–20)
Calcium, Ion: 1.24 mmol/L (ref 1.15–1.40)
Calcium, Ion: 1.1 mmol/L — ABNORMAL LOW (ref 1.15–1.40)
Calcium, Ion: 1.16 mmol/L (ref 1.15–1.40)
Calcium, Ion: 1 mmol/L — ABNORMAL LOW (ref 1.15–1.40)
Calcium, Ion: 1.13 mmol/L — ABNORMAL LOW (ref 1.15–1.40)
Calcium, Ion: 1.09 mmol/L — ABNORMAL LOW (ref 1.15–1.40)
Calcium, Ion: 1.15 mmol/L (ref 1.15–1.40)
Chloride: 103 mmol/L (ref 101–111)
Chloride: 102 mmol/L (ref 101–111)
Chloride: 105 mmol/L (ref 101–111)
Chloride: 101 mmol/L (ref 101–111)
Chloride: 101 mmol/L (ref 101–111)
Chloride: 103 mmol/L (ref 101–111)
Chloride: 103 mmol/L (ref 101–111)
Creatinine, Ser: 1.1 mg/dL (ref 0.61–1.24)
Creatinine, Ser: 1 mg/dL (ref 0.61–1.24)
Creatinine, Ser: 1.1 mg/dL (ref 0.61–1.24)
Creatinine, Ser: 1.1 mg/dL (ref 0.61–1.24)
Creatinine, Ser: 1 mg/dL (ref 0.61–1.24)
Creatinine, Ser: 1.2 mg/dL (ref 0.61–1.24)
Creatinine, Ser: 1.1 mg/dL (ref 0.61–1.24)
Glucose, Bld: 125 mg/dL — ABNORMAL HIGH (ref 65–99)
Glucose, Bld: 115 mg/dL — ABNORMAL HIGH (ref 65–99)
Glucose, Bld: 117 mg/dL — ABNORMAL HIGH (ref 65–99)
Glucose, Bld: 125 mg/dL — ABNORMAL HIGH (ref 65–99)
Glucose, Bld: 144 mg/dL — ABNORMAL HIGH (ref 65–99)
Glucose, Bld: 142 mg/dL — ABNORMAL HIGH (ref 65–99)
Glucose, Bld: 186 mg/dL — ABNORMAL HIGH (ref 65–99)
HCT: 40 % (ref 39.0–52.0)
HCT: 31 % — ABNORMAL LOW (ref 39.0–52.0)
HCT: 35 % — ABNORMAL LOW (ref 39.0–52.0)
HCT: 31 % — ABNORMAL LOW (ref 39.0–52.0)
HCT: 29 % — ABNORMAL LOW (ref 39.0–52.0)
HCT: 22 % — ABNORMAL LOW (ref 39.0–52.0)
HCT: 24 % — ABNORMAL LOW (ref 39.0–52.0)
Hemoglobin: 13.6 g/dL (ref 13.0–17.0)
Hemoglobin: 10.5 g/dL — ABNORMAL LOW (ref 13.0–17.0)
Hemoglobin: 11.9 g/dL — ABNORMAL LOW (ref 13.0–17.0)
Hemoglobin: 10.5 g/dL — ABNORMAL LOW (ref 13.0–17.0)
Hemoglobin: 9.9 g/dL — ABNORMAL LOW (ref 13.0–17.0)
Hemoglobin: 7.5 g/dL — ABNORMAL LOW (ref 13.0–17.0)
Hemoglobin: 8.2 g/dL — ABNORMAL LOW (ref 13.0–17.0)
Potassium: 4.2 mmol/L (ref 3.5–5.1)
Potassium: 3.9 mmol/L (ref 3.5–5.1)
Potassium: 4.1 mmol/L (ref 3.5–5.1)
Potassium: 4.5 mmol/L (ref 3.5–5.1)
Potassium: 4 mmol/L (ref 3.5–5.1)
Potassium: 4.4 mmol/L (ref 3.5–5.1)
Potassium: 4.6 mmol/L (ref 3.5–5.1)
Sodium: 136 mmol/L (ref 135–145)
Sodium: 138 mmol/L (ref 135–145)
Sodium: 139 mmol/L (ref 135–145)
Sodium: 138 mmol/L (ref 135–145)
Sodium: 139 mmol/L (ref 135–145)
Sodium: 140 mmol/L (ref 135–145)
Sodium: 139 mmol/L (ref 135–145)
TCO2: 29 mmol/L (ref 22–32)
TCO2: 27 mmol/L (ref 22–32)
TCO2: 29 mmol/L (ref 22–32)
TCO2: 26 mmol/L (ref 22–32)
TCO2: 30 mmol/L (ref 22–32)
TCO2: 26 mmol/L (ref 22–32)
TCO2: 26 mmol/L (ref 22–32)

## 2017-05-15 LAB — PLATELET COUNT: Platelets: 159 10*3/uL (ref 150–400)

## 2017-05-15 LAB — CBC
HCT: 39 % (ref 39.0–52.0)
HCT: 27.2 % — ABNORMAL LOW (ref 39.0–52.0)
HCT: 24 % — ABNORMAL LOW (ref 39.0–52.0)
HCT: 27.7 % — ABNORMAL LOW (ref 39.0–52.0)
Hemoglobin: 13 g/dL (ref 13.0–17.0)
Hemoglobin: 8.9 g/dL — ABNORMAL LOW (ref 13.0–17.0)
Hemoglobin: 7.7 g/dL — ABNORMAL LOW (ref 13.0–17.0)
Hemoglobin: 8.9 g/dL — ABNORMAL LOW (ref 13.0–17.0)
MCH: 28.6 pg (ref 26.0–34.0)
MCH: 27.9 pg (ref 26.0–34.0)
MCH: 27.5 pg (ref 26.0–34.0)
MCH: 27.6 pg (ref 26.0–34.0)
MCHC: 33.3 g/dL (ref 30.0–36.0)
MCHC: 32.7 g/dL (ref 30.0–36.0)
MCHC: 32.1 g/dL (ref 30.0–36.0)
MCHC: 32.1 g/dL (ref 30.0–36.0)
MCV: 85.7 fL (ref 78.0–100.0)
MCV: 85.3 fL (ref 78.0–100.0)
MCV: 85.7 fL (ref 78.0–100.0)
MCV: 85.8 fL (ref 78.0–100.0)
Platelets: 227 10*3/uL (ref 150–400)
Platelets: 188 10*3/uL (ref 150–400)
Platelets: 165 10*3/uL (ref 150–400)
Platelets: 148 10*3/uL — ABNORMAL LOW (ref 150–400)
RBC: 4.55 MIL/uL (ref 4.22–5.81)
RBC: 3.19 MIL/uL — ABNORMAL LOW (ref 4.22–5.81)
RBC: 2.8 MIL/uL — ABNORMAL LOW (ref 4.22–5.81)
RBC: 3.23 MIL/uL — ABNORMAL LOW (ref 4.22–5.81)
RDW: 15.8 % — ABNORMAL HIGH (ref 11.5–15.5)
RDW: 16 % — ABNORMAL HIGH (ref 11.5–15.5)
RDW: 15.9 % — ABNORMAL HIGH (ref 11.5–15.5)
RDW: 15.5 % (ref 11.5–15.5)
WBC: 6.5 10*3/uL (ref 4.0–10.5)
WBC: 9.8 10*3/uL (ref 4.0–10.5)
WBC: 8.8 10*3/uL (ref 4.0–10.5)
WBC: 7.7 10*3/uL (ref 4.0–10.5)

## 2017-05-15 LAB — POCT I-STAT EG7
Acid-Base Excess: 2 mmol/L (ref 0.0–2.0)
Bicarbonate: 25.6 mmol/L (ref 20.0–28.0)
Calcium, Ion: 1.14 mmol/L — ABNORMAL LOW (ref 1.15–1.40)
HCT: 26 % — ABNORMAL LOW (ref 39.0–52.0)
Hemoglobin: 8.8 g/dL — ABNORMAL LOW (ref 13.0–17.0)
O2 Saturation: 86 %
Patient temperature: 37.3
Potassium: 4 mmol/L (ref 3.5–5.1)
Sodium: 140 mmol/L (ref 135–145)
TCO2: 27 mmol/L (ref 22–32)
pCO2, Ven: 36.4 mmHg — ABNORMAL LOW (ref 44.0–60.0)
pH, Ven: 7.456 — ABNORMAL HIGH (ref 7.250–7.430)
pO2, Ven: 49 mmHg — ABNORMAL HIGH (ref 32.0–45.0)

## 2017-05-15 LAB — POCT I-STAT 3, ART BLOOD GAS (G3+)
Acid-Base Excess: 2 mmol/L (ref 0.0–2.0)
Acid-Base Excess: 2 mmol/L (ref 0.0–2.0)
Acid-Base Excess: 5 mmol/L — ABNORMAL HIGH (ref 0.0–2.0)
Acid-Base Excess: 1 mmol/L (ref 0.0–2.0)
Acid-Base Excess: 1 mmol/L (ref 0.0–2.0)
Bicarbonate: 26.7 mmol/L (ref 20.0–28.0)
Bicarbonate: 26.8 mmol/L (ref 20.0–28.0)
Bicarbonate: 28.3 mmol/L — ABNORMAL HIGH (ref 20.0–28.0)
Bicarbonate: 25.7 mmol/L (ref 20.0–28.0)
Bicarbonate: 25.4 mmol/L (ref 20.0–28.0)
Bicarbonate: 26 mmol/L (ref 20.0–28.0)
O2 Saturation: 100 %
O2 Saturation: 100 %
O2 Saturation: 100 %
O2 Saturation: 100 %
O2 Saturation: 100 %
O2 Saturation: 100 %
Patient temperature: 36.2
Patient temperature: 35.7
Patient temperature: 36.2
TCO2: 28 mmol/L (ref 22–32)
TCO2: 28 mmol/L (ref 22–32)
TCO2: 29 mmol/L (ref 22–32)
TCO2: 27 mmol/L (ref 22–32)
TCO2: 27 mmol/L (ref 22–32)
TCO2: 27 mmol/L (ref 22–32)
pCO2 arterial: 40.4 mmHg (ref 32.0–48.0)
pCO2 arterial: 39.5 mmHg (ref 32.0–48.0)
pCO2 arterial: 34.4 mmHg (ref 32.0–48.0)
pCO2 arterial: 38.7 mmHg (ref 32.0–48.0)
pCO2 arterial: 39.3 mmHg (ref 32.0–48.0)
pCO2 arterial: 41.2 mmHg (ref 32.0–48.0)
pH, Arterial: 7.428 (ref 7.350–7.450)
pH, Arterial: 7.439 (ref 7.350–7.450)
pH, Arterial: 7.523 — ABNORMAL HIGH (ref 7.350–7.450)
pH, Arterial: 7.428 (ref 7.350–7.450)
pH, Arterial: 7.412 (ref 7.350–7.450)
pH, Arterial: 7.405 (ref 7.350–7.450)
pO2, Arterial: 498 mmHg — ABNORMAL HIGH (ref 83.0–108.0)
pO2, Arterial: 323 mmHg — ABNORMAL HIGH (ref 83.0–108.0)
pO2, Arterial: 454 mmHg — ABNORMAL HIGH (ref 83.0–108.0)
pO2, Arterial: 164 mmHg — ABNORMAL HIGH (ref 83.0–108.0)
pO2, Arterial: 235 mmHg — ABNORMAL HIGH (ref 83.0–108.0)
pO2, Arterial: 230 mmHg — ABNORMAL HIGH (ref 83.0–108.0)

## 2017-05-15 LAB — POCT I-STAT 4, (NA,K, GLUC, HGB,HCT)
Glucose, Bld: 143 mg/dL — ABNORMAL HIGH (ref 65–99)
HCT: 28 % — ABNORMAL LOW (ref 39.0–52.0)
Hemoglobin: 9.5 g/dL — ABNORMAL LOW (ref 13.0–17.0)
Potassium: 3.8 mmol/L (ref 3.5–5.1)
Sodium: 140 mmol/L (ref 135–145)

## 2017-05-15 LAB — HEMOGLOBIN AND HEMATOCRIT, BLOOD
HCT: 28.7 % — ABNORMAL LOW (ref 39.0–52.0)
Hemoglobin: 9.3 g/dL — ABNORMAL LOW (ref 13.0–17.0)

## 2017-05-15 LAB — BLOOD GAS, ARTERIAL
Acid-Base Excess: 2.7 mmol/L — ABNORMAL HIGH (ref 0.0–2.0)
Bicarbonate: 26.3 mmol/L (ref 20.0–28.0)
Drawn by: 414221
O2 Saturation: 97.2 %
Patient temperature: 98.6
pCO2 arterial: 37.5 mmHg (ref 32.0–48.0)
pH, Arterial: 7.46 — ABNORMAL HIGH (ref 7.350–7.450)
pO2, Arterial: 92.1 mmHg (ref 83.0–108.0)

## 2017-05-15 LAB — BASIC METABOLIC PANEL
Anion gap: 11 (ref 5–15)
Anion gap: 6 (ref 5–15)
BUN: 38 mg/dL — ABNORMAL HIGH (ref 6–20)
BUN: 30 mg/dL — ABNORMAL HIGH (ref 6–20)
CO2: 26 mmol/L (ref 22–32)
CO2: 26 mmol/L (ref 22–32)
Calcium: 9.6 mg/dL (ref 8.9–10.3)
Calcium: 8.3 mg/dL — ABNORMAL LOW (ref 8.9–10.3)
Chloride: 97 mmol/L — ABNORMAL LOW (ref 101–111)
Chloride: 106 mmol/L (ref 101–111)
Creatinine, Ser: 1.35 mg/dL — ABNORMAL HIGH (ref 0.61–1.24)
Creatinine, Ser: 1.36 mg/dL — ABNORMAL HIGH (ref 0.61–1.24)
GFR calc Af Amer: >60 mL/min (ref >60)
GFR calc Af Amer: >60 mL/min (ref >60)
GFR calc non Af Amer: >60 mL/min (ref >60)
GFR calc non Af Amer: >60 mL/min (ref >60)
Glucose, Bld: 163 mg/dL — ABNORMAL HIGH (ref 65–99)
Glucose, Bld: 124 mg/dL — ABNORMAL HIGH (ref 65–99)
Potassium: 4.1 mmol/L (ref 3.5–5.1)
Potassium: 4 mmol/L (ref 3.5–5.1)
Sodium: 134 mmol/L — ABNORMAL LOW (ref 135–145)
Sodium: 138 mmol/L (ref 135–145)

## 2017-05-15 LAB — COOXEMETRY PANEL
Carboxyhemoglobin: 1.3 % (ref 0.5–1.5)
Carboxyhemoglobin: 1.3 % (ref 0.5–1.5)
Methemoglobin: 1.2 % (ref 0.0–1.5)
Methemoglobin: 2 % — ABNORMAL HIGH (ref 0.0–1.5)
O2 Saturation: 67.7 %
O2 Saturation: 76.2 %
Total hemoglobin: 13.2 g/dL (ref 12.0–16.0)
Total hemoglobin: 9 g/dL — ABNORMAL LOW (ref 12.0–16.0)

## 2017-05-15 LAB — CREATININE, SERUM
Creatinine, Ser: 1.26 mg/dL — ABNORMAL HIGH (ref 0.61–1.24)
Creatinine, Ser: 1.24 mg/dL (ref 0.61–1.24)
GFR calc Af Amer: >60 mL/min (ref >60)
GFR calc Af Amer: >60 mL/min (ref >60)
GFR calc non Af Amer: >60 mL/min (ref >60)
GFR calc non Af Amer: >60 mL/min (ref >60)

## 2017-05-15 LAB — FIBRINOGEN
Fibrinogen: 481 mg/dL — ABNORMAL HIGH (ref 210–475)
Fibrinogen: 409 mg/dL (ref 210–475)

## 2017-05-15 LAB — POCT ACTIVATED CLOTTING TIME: Activated Clotting Time: 136 seconds

## 2017-05-15 LAB — PROTIME-INR
INR: 1.33
INR: 0.99
Prothrombin Time: 16.3 seconds — ABNORMAL HIGH (ref 11.4–15.2)
Prothrombin Time: 13 seconds (ref 11.4–15.2)

## 2017-05-15 LAB — DIC (DISSEMINATED INTRAVASCULAR COAGULATION)PANEL
Fibrinogen: 421 mg/dL (ref 210–475)
Prothrombin Time: 17 seconds — ABNORMAL HIGH (ref 11.4–15.2)
aPTT: 71 seconds — ABNORMAL HIGH (ref 24–36)

## 2017-05-15 LAB — GLUCOSE, CAPILLARY
Glucose-Capillary: 108 mg/dL — ABNORMAL HIGH (ref 65–99)
Glucose-Capillary: 134 mg/dL — ABNORMAL HIGH (ref 65–99)
Glucose-Capillary: 129 mg/dL — ABNORMAL HIGH (ref 65–99)
Glucose-Capillary: 110 mg/dL — ABNORMAL HIGH (ref 65–99)
Glucose-Capillary: 126 mg/dL — ABNORMAL HIGH (ref 65–99)
Glucose-Capillary: 166 mg/dL — ABNORMAL HIGH (ref 65–99)
Glucose-Capillary: 141 mg/dL — ABNORMAL HIGH (ref 65–99)
Glucose-Capillary: 154 mg/dL — ABNORMAL HIGH (ref 65–99)

## 2017-05-15 LAB — APTT
aPTT: 60 seconds — ABNORMAL HIGH (ref 24–36)
aPTT: 44 seconds — ABNORMAL HIGH (ref 24–36)

## 2017-05-15 LAB — PREPARE RBC (CROSSMATCH)

## 2017-05-15 LAB — DIC (DISSEMINATED INTRAVASCULAR COAGULATION) PANEL (NOT AT ARMC)
D-Dimer, Quant: 9.12 ug/mL-FEU — ABNORMAL HIGH (ref 0.00–0.50)
INR: 1.39
Platelets: 208 10*3/uL (ref 150–400)
Smear Review: NONE SEEN

## 2017-05-15 LAB — MAGNESIUM
Magnesium: 2.2 mg/dL (ref 1.7–2.4)
Magnesium: 2.8 mg/dL — ABNORMAL HIGH (ref 1.7–2.4)
Magnesium: 2.7 mg/dL — ABNORMAL HIGH (ref 1.7–2.4)

## 2017-05-15 LAB — HEPARIN LEVEL (UNFRACTIONATED): Heparin Unfractionated: <0.1 IU/mL — ABNORMAL LOW (ref 0.30–0.70)

## 2017-05-15 SURGERY — INSERTION OF IMPLANTABLE LEFT VENTRICULAR ASSIST DEVICE
Anesthesia: General | Site: Chest

## 2017-05-15 MED ORDER — ACETAMINOPHEN 500 MG PO TABS
1000.0000 mg | ORAL_TABLET | Freq: Four times a day (QID) | ORAL | Status: AC
  Administered 2017-05-16 – 2017-05-20 (×17): 1000 mg via ORAL
  Filled 2017-05-15 (×19): qty 2

## 2017-05-15 MED ORDER — PHENYLEPHRINE 40 MCG/ML (10ML) SYRINGE FOR IV PUSH (FOR BLOOD PRESSURE SUPPORT)
PREFILLED_SYRINGE | INTRAVENOUS | Status: DC | PRN
  Administered 2017-05-15: 80 ug via INTRAVENOUS
  Administered 2017-05-15: 400 ug via INTRAVENOUS
  Administered 2017-05-15 (×2): 80 ug via INTRAVENOUS
  Administered 2017-05-15: 40 ug via INTRAVENOUS
  Administered 2017-05-15: 80 ug via INTRAVENOUS

## 2017-05-15 MED ORDER — VANCOMYCIN HCL IN DEXTROSE 1-5 GM/200ML-% IV SOLN
1000.0000 mg | Freq: Two times a day (BID) | INTRAVENOUS | Status: AC
  Administered 2017-05-15 – 2017-05-16 (×3): 1000 mg via INTRAVENOUS
  Filled 2017-05-15 (×3): qty 200

## 2017-05-15 MED ORDER — ASPIRIN 81 MG PO CHEW
324.0000 mg | CHEWABLE_TABLET | Freq: Every day | ORAL | Status: DC
  Administered 2017-05-16: 324 mg
  Filled 2017-05-15: qty 4

## 2017-05-15 MED ORDER — HEPARIN SODIUM (PORCINE) 1000 UNIT/ML IJ SOLN
INTRAMUSCULAR | Status: DC | PRN
  Administered 2017-05-15: 27000 [IU] via INTRAVENOUS

## 2017-05-15 MED ORDER — MIDAZOLAM HCL 2 MG/2ML IJ SOLN
INTRAMUSCULAR | Status: AC
  Filled 2017-05-15: qty 2

## 2017-05-15 MED ORDER — SODIUM CHLORIDE 0.9 % IV SOLN
INTRAVENOUS | Status: DC
  Administered 2017-05-16: 8.7 [IU]/h via INTRAVENOUS
  Filled 2017-05-15: qty 1

## 2017-05-15 MED ORDER — HEMOSTATIC AGENTS (NO CHARGE) OPTIME
TOPICAL | Status: DC | PRN
  Administered 2017-05-15: 1 via TOPICAL

## 2017-05-15 MED ORDER — FENTANYL CITRATE (PF) 250 MCG/5ML IJ SOLN
INTRAMUSCULAR | Status: AC
  Filled 2017-05-15: qty 10

## 2017-05-15 MED ORDER — FAMOTIDINE IN NACL 20-0.9 MG/50ML-% IV SOLN
20.0000 mg | Freq: Two times a day (BID) | INTRAVENOUS | Status: AC
  Administered 2017-05-15 – 2017-05-16 (×2): 20 mg via INTRAVENOUS
  Filled 2017-05-15: qty 50

## 2017-05-15 MED ORDER — SODIUM CHLORIDE 0.9% FLUSH: 3.0000 mL | INTRAVENOUS | Status: DC | PRN

## 2017-05-15 MED ORDER — SODIUM CHLORIDE 0.9 % IV SOLN: 250.0000 mL | INTRAVENOUS | Status: DC

## 2017-05-15 MED ORDER — SODIUM CHLORIDE 0.9 % IJ SOLN
OROMUCOSAL | Status: DC | PRN
  Administered 2017-05-15 (×3): via TOPICAL

## 2017-05-15 MED ORDER — TRAMADOL HCL 50 MG PO TABS
50.0000 mg | ORAL_TABLET | ORAL | Status: DC | PRN
  Administered 2017-05-16: 50 mg via ORAL
  Administered 2017-05-23 – 2017-06-02 (×3): 100 mg via ORAL
  Filled 2017-05-15: qty 2
  Filled 2017-05-15: qty 1
  Filled 2017-05-15 (×2): qty 2

## 2017-05-15 MED ORDER — ASPIRIN EC 325 MG PO TBEC
325.0000 mg | DELAYED_RELEASE_TABLET | Freq: Every day | ORAL | Status: DC
  Administered 2017-05-17: 325 mg via ORAL
  Filled 2017-05-15: qty 1

## 2017-05-15 MED ORDER — BISACODYL 5 MG PO TBEC
10.0000 mg | DELAYED_RELEASE_TABLET | Freq: Every day | ORAL | Status: DC
  Administered 2017-05-16 – 2017-06-02 (×13): 10 mg via ORAL
  Filled 2017-05-15 (×17): qty 2

## 2017-05-15 MED ORDER — MIDAZOLAM HCL 10 MG/2ML IJ SOLN
INTRAMUSCULAR | Status: AC
  Filled 2017-05-15: qty 2

## 2017-05-15 MED ORDER — MIDAZOLAM HCL 5 MG/5ML IJ SOLN
INTRAMUSCULAR | Status: DC | PRN
  Administered 2017-05-15: 3 mg via INTRAVENOUS
  Administered 2017-05-15 (×4): 2 mg via INTRAVENOUS
  Administered 2017-05-15: 4 mg via INTRAVENOUS
  Administered 2017-05-15: 3 mg via INTRAVENOUS

## 2017-05-15 MED ORDER — MORPHINE SULFATE (PF) 2 MG/ML IV SOLN
2.0000 mg | INTRAVENOUS | Status: DC | PRN
  Administered 2017-05-15 – 2017-05-16 (×7): 2 mg via INTRAVENOUS
  Administered 2017-05-16 – 2017-05-17 (×2): 4 mg via INTRAVENOUS
  Administered 2017-05-17 – 2017-05-18 (×2): 2 mg via INTRAVENOUS
  Filled 2017-05-15 (×2): qty 2
  Filled 2017-05-15: qty 1
  Filled 2017-05-15: qty 2
  Filled 2017-05-15: qty 1
  Filled 2017-05-15: qty 2
  Filled 2017-05-15 (×3): qty 1

## 2017-05-15 MED ORDER — SODIUM CHLORIDE 0.9% FLUSH
3.0000 mL | Freq: Two times a day (BID) | INTRAVENOUS | Status: DC
  Administered 2017-05-16: 3 mL via INTRAVENOUS

## 2017-05-15 MED ORDER — DEXAMETHASONE SODIUM PHOSPHATE 10 MG/ML IJ SOLN
INTRAMUSCULAR | Status: AC
  Filled 2017-05-15: qty 1

## 2017-05-15 MED ORDER — ETOMIDATE 2 MG/ML IV SOLN
INTRAVENOUS | Status: AC
  Filled 2017-05-15: qty 10

## 2017-05-15 MED ORDER — ETOMIDATE 2 MG/ML IV SOLN
INTRAVENOUS | Status: DC | PRN
  Administered 2017-05-15: 10 mg via INTRAVENOUS

## 2017-05-15 MED ORDER — FENTANYL CITRATE (PF) 250 MCG/5ML IJ SOLN
INTRAMUSCULAR | Status: AC
  Filled 2017-05-15: qty 5

## 2017-05-15 MED ORDER — ACETAMINOPHEN 160 MG/5ML PO SOLN
1000.0000 mg | Freq: Four times a day (QID) | ORAL | Status: AC
  Administered 2017-05-15 – 2017-05-16 (×2): 1000 mg
  Filled 2017-05-15 (×2): qty 40.6

## 2017-05-15 MED ORDER — CHLORHEXIDINE GLUCONATE 0.12% ORAL RINSE (MEDLINE KIT)
15.0000 mL | Freq: Two times a day (BID) | OROMUCOSAL | Status: DC
  Administered 2017-05-15: 15 mL via OROMUCOSAL

## 2017-05-15 MED ORDER — ALBUMIN HUMAN 5 % IV SOLN
250.0000 mL | INTRAVENOUS | Status: AC | PRN
  Administered 2017-05-15 (×6): 250 mL via INTRAVENOUS
  Filled 2017-05-15 (×3): qty 250

## 2017-05-15 MED ORDER — SODIUM CHLORIDE 0.45 % IV SOLN
INTRAVENOUS | Status: DC | PRN
  Administered 2017-05-15: 14:00:00 via INTRAVENOUS

## 2017-05-15 MED ORDER — ACETAMINOPHEN 650 MG RE SUPP
650.0000 mg | Freq: Once | RECTAL | Status: AC
  Administered 2017-05-15: 650 mg via RECTAL

## 2017-05-15 MED ORDER — FENTANYL CITRATE (PF) 250 MCG/5ML IJ SOLN
INTRAMUSCULAR | Status: DC | PRN
  Administered 2017-05-15: 225 ug via INTRAVENOUS
  Administered 2017-05-15: 150 ug via INTRAVENOUS
  Administered 2017-05-15: 50 ug via INTRAVENOUS
  Administered 2017-05-15: 150 ug via INTRAVENOUS
  Administered 2017-05-15 (×3): 100 ug via INTRAVENOUS
  Administered 2017-05-15: 50 ug via INTRAVENOUS
  Administered 2017-05-15: 100 ug via INTRAVENOUS
  Administered 2017-05-15: 225 ug via INTRAVENOUS

## 2017-05-15 MED ORDER — ALBUMIN HUMAN 5 % IV SOLN
INTRAVENOUS | Status: DC | PRN
  Administered 2017-05-15: 10:00:00 via INTRAVENOUS

## 2017-05-15 MED ORDER — ROCURONIUM BROMIDE 10 MG/ML (PF) SYRINGE
PREFILLED_SYRINGE | INTRAVENOUS | Status: AC
  Filled 2017-05-15: qty 5

## 2017-05-15 MED ORDER — PROTAMINE SULFATE 10 MG/ML IV SOLN
INTRAVENOUS | Status: DC | PRN
  Administered 2017-05-15: 40 mg via INTRAVENOUS
  Administered 2017-05-15: 50 mg via INTRAVENOUS
  Administered 2017-05-15: 40 mg via INTRAVENOUS
  Administered 2017-05-15: 50 mg via INTRAVENOUS
  Administered 2017-05-15 (×2): 60 mg via INTRAVENOUS

## 2017-05-15 MED ORDER — CHLORHEXIDINE GLUCONATE 0.12 % MT SOLN: 15.0000 mL | Freq: Two times a day (BID) | OROMUCOSAL | Status: DC

## 2017-05-15 MED ORDER — LIDOCAINE 2% (20 MG/ML) 5 ML SYRINGE
INTRAMUSCULAR | Status: AC
  Filled 2017-05-15: qty 5

## 2017-05-15 MED ORDER — NOREPINEPHRINE BITARTRATE 1 MG/ML IV SOLN
0.0000 ug/min | INTRAVENOUS | Status: DC
  Filled 2017-05-15: qty 4

## 2017-05-15 MED ORDER — ACETAMINOPHEN 160 MG/5ML PO SOLN: 650.0000 mg | Freq: Once | ORAL | Status: AC

## 2017-05-15 MED ORDER — FENTANYL CITRATE (PF) 250 MCG/5ML IJ SOLN
INTRAMUSCULAR | Status: AC
Start: 2017-05-15 — End: ?
  Filled 2017-05-15: qty 10

## 2017-05-15 MED ORDER — DESMOPRESSIN ACETATE 4 MCG/ML IJ SOLN
20.0000 ug | Freq: Once | INTRAMUSCULAR | Status: AC
  Administered 2017-05-15: 20 ug via INTRAVENOUS
  Filled 2017-05-15: qty 5

## 2017-05-15 MED ORDER — LACTATED RINGERS IV SOLN
INTRAVENOUS | Status: DC | PRN
  Administered 2017-05-15: 09:00:00 via INTRAVENOUS

## 2017-05-15 MED ORDER — VANCOMYCIN HCL 1000 MG IV SOLR
INTRAVENOUS | Status: DC | PRN
  Administered 2017-05-15: 1000 mg

## 2017-05-15 MED ORDER — SODIUM CHLORIDE 0.9 % IV SOLN: Freq: Once | INTRAVENOUS | Status: DC

## 2017-05-15 MED ORDER — HEPARIN SODIUM (PORCINE) 1000 UNIT/ML IJ SOLN
INTRAMUSCULAR | Status: AC
  Filled 2017-05-15: qty 1

## 2017-05-15 MED ORDER — OXYCODONE HCL 5 MG PO TABS
5.0000 mg | ORAL_TABLET | ORAL | Status: DC | PRN
  Administered 2017-05-16: 5 mg via ORAL
  Administered 2017-05-17 – 2017-06-04 (×18): 10 mg via ORAL
  Filled 2017-05-15 (×17): qty 2
  Filled 2017-05-15: qty 1
  Filled 2017-05-15 (×2): qty 2

## 2017-05-15 MED ORDER — DOCUSATE SODIUM 100 MG PO CAPS: 200.0000 mg | ORAL_CAPSULE | Freq: Every day | ORAL | Status: DC

## 2017-05-15 MED ORDER — LIDOCAINE 2% (20 MG/ML) 5 ML SYRINGE
INTRAMUSCULAR | Status: DC | PRN
  Administered 2017-05-15: 60 mg via INTRAVENOUS

## 2017-05-15 MED ORDER — CALCIUM CHLORIDE 10 % IV SOLN
INTRAVENOUS | Status: DC | PRN
  Administered 2017-05-15: 100 mg via INTRAVENOUS

## 2017-05-15 MED ORDER — LACTATED RINGERS IV SOLN: INTRAVENOUS | Status: DC

## 2017-05-15 MED ORDER — CALCIUM CHLORIDE 10 % IV SOLN
INTRAVENOUS | Status: AC
  Filled 2017-05-15: qty 20

## 2017-05-15 MED ORDER — POTASSIUM CHLORIDE 10 MEQ/50ML IV SOLN
10.0000 meq | INTRAVENOUS | Status: AC
  Administered 2017-05-15 (×3): 10 meq via INTRAVENOUS

## 2017-05-15 MED ORDER — LACTATED RINGERS IV SOLN
INTRAVENOUS | Status: DC
  Administered 2017-05-15: 14:00:00 via INTRAVENOUS

## 2017-05-15 MED ORDER — ORAL CARE MOUTH RINSE
15.0000 mL | OROMUCOSAL | Status: DC
  Administered 2017-05-15 – 2017-05-16 (×5): 15 mL via OROMUCOSAL

## 2017-05-15 MED ORDER — MAGNESIUM SULFATE 4 GM/100ML IV SOLN
4.0000 g | Freq: Once | INTRAVENOUS | Status: AC
  Administered 2017-05-15: 4 g via INTRAVENOUS
  Filled 2017-05-15: qty 100

## 2017-05-15 MED ORDER — VANCOMYCIN HCL 1000 MG IV SOLR
INTRAVENOUS | Status: AC
  Filled 2017-05-15: qty 1000

## 2017-05-15 MED ORDER — DEXTROSE 5 % IV SOLN
3.0000 ug/min | INTRAVENOUS | Status: DC
  Administered 2017-05-16: 3 ug/min via INTRAVENOUS
  Filled 2017-05-15 (×2): qty 4

## 2017-05-15 MED ORDER — MILRINONE LACTATE IN DEXTROSE 20-5 MG/100ML-% IV SOLN
0.1250 ug/kg/min | INTRAVENOUS | Status: DC
  Administered 2017-05-15 – 2017-05-19 (×9): 0.25 ug/kg/min via INTRAVENOUS
  Administered 2017-05-20 – 2017-05-22 (×2): 0.125 ug/kg/min via INTRAVENOUS
  Filled 2017-05-15 (×11): qty 100

## 2017-05-15 MED ORDER — BISACODYL 10 MG RE SUPP: 10.0000 mg | Freq: Every day | RECTAL | Status: DC

## 2017-05-15 MED ORDER — LACTATED RINGERS IV SOLN: INTRAVENOUS | Status: DC | PRN

## 2017-05-15 MED ORDER — PROPOFOL 10 MG/ML IV BOLUS
INTRAVENOUS | Status: AC
  Filled 2017-05-15: qty 20

## 2017-05-15 MED ORDER — NOREPINEPHRINE BITARTRATE 1 MG/ML IV SOLN
0.0000 ug/min | INTRAVENOUS | Status: DC
  Administered 2017-05-16: 8 ug/min via INTRAVENOUS
  Filled 2017-05-15 (×3): qty 4

## 2017-05-15 MED ORDER — DEXMEDETOMIDINE HCL IN NACL 200 MCG/50ML IV SOLN
0.1000 ug/kg/h | INTRAVENOUS | Status: DC
  Administered 2017-05-15 – 2017-05-16 (×7): 0.7 ug/kg/h via INTRAVENOUS
  Filled 2017-05-15: qty 100
  Filled 2017-05-15: qty 50
  Filled 2017-05-15: qty 100
  Filled 2017-05-15: qty 50

## 2017-05-15 MED ORDER — ONDANSETRON HCL 4 MG/2ML IJ SOLN
INTRAMUSCULAR | Status: AC
Start: 2017-05-15 — End: ?
  Filled 2017-05-15: qty 2

## 2017-05-15 MED ORDER — LACTATED RINGERS IV SOLN
INTRAVENOUS | Status: DC | PRN
  Administered 2017-05-15 (×2): via INTRAVENOUS

## 2017-05-15 MED ORDER — ROCURONIUM BROMIDE 10 MG/ML (PF) SYRINGE
PREFILLED_SYRINGE | INTRAVENOUS | Status: DC | PRN
  Administered 2017-05-15: 50 mg via INTRAVENOUS
  Administered 2017-05-15: 100 mg via INTRAVENOUS
  Administered 2017-05-15: 50 mg via INTRAVENOUS

## 2017-05-15 MED ORDER — LACTATED RINGERS IV SOLN: 500.0000 mL | Freq: Once | INTRAVENOUS | Status: DC | PRN

## 2017-05-15 MED ORDER — COAGULATION FACTOR VIIA RECOMB 1 MG IV SOLR
45.0000 ug/kg | Freq: Once | INTRAVENOUS | Status: AC
  Administered 2017-05-15: 1000 ug via INTRAVENOUS
  Filled 2017-05-15: qty 4

## 2017-05-15 MED ORDER — ARTIFICIAL TEARS OPHTHALMIC OINT
TOPICAL_OINTMENT | OPHTHALMIC | Status: DC | PRN
  Administered 2017-05-15: 1 via OPHTHALMIC

## 2017-05-15 MED ORDER — RIFAMPIN 300 MG PO CAPS
600.0000 mg | ORAL_CAPSULE | Freq: Once | ORAL | Status: AC
  Administered 2017-05-16: 600 mg via ORAL
  Filled 2017-05-15: qty 2

## 2017-05-15 MED ORDER — CHLORHEXIDINE GLUCONATE 0.12 % MT SOLN
15.0000 mL | OROMUCOSAL | Status: AC
  Administered 2017-05-15: 15 mL via OROMUCOSAL

## 2017-05-15 MED ORDER — ONDANSETRON HCL 4 MG/2ML IJ SOLN: 4.0000 mg | Freq: Four times a day (QID) | INTRAMUSCULAR | Status: DC | PRN

## 2017-05-15 MED ORDER — FLUCONAZOLE IN SODIUM CHLORIDE 400-0.9 MG/200ML-% IV SOLN
400.0000 mg | Freq: Once | INTRAVENOUS | Status: AC
  Administered 2017-05-16: 400 mg via INTRAVENOUS
  Filled 2017-05-15: qty 200

## 2017-05-15 MED ORDER — PANTOPRAZOLE SODIUM 40 MG PO TBEC
40.0000 mg | DELAYED_RELEASE_TABLET | Freq: Every day | ORAL | Status: DC
  Administered 2017-05-17 – 2017-06-04 (×19): 40 mg via ORAL
  Filled 2017-05-15 (×19): qty 1

## 2017-05-15 MED ORDER — PHENYLEPHRINE 40 MCG/ML (10ML) SYRINGE FOR IV PUSH (FOR BLOOD PRESSURE SUPPORT)
PREFILLED_SYRINGE | INTRAVENOUS | Status: AC
  Filled 2017-05-15: qty 40

## 2017-05-15 MED ORDER — METOCLOPRAMIDE HCL 5 MG/ML IJ SOLN
10.0000 mg | Freq: Four times a day (QID) | INTRAMUSCULAR | Status: AC
  Administered 2017-05-15 – 2017-05-21 (×23): 10 mg via INTRAVENOUS
  Filled 2017-05-15 (×22): qty 2

## 2017-05-15 MED ORDER — INSULIN REGULAR BOLUS VIA INFUSION
0.0000 [IU] | Freq: Three times a day (TID) | INTRAVENOUS | Status: DC
  Filled 2017-05-15: qty 10

## 2017-05-15 MED ORDER — MORPHINE SULFATE (PF) 2 MG/ML IV SOLN: 1.0000 mg | INTRAVENOUS | Status: AC | PRN

## 2017-05-15 MED ORDER — SODIUM CHLORIDE 0.9 % IV SOLN
INTRAVENOUS | Status: AC | PRN
  Administered 2017-05-15: 1000 mL

## 2017-05-15 MED ORDER — PHENYLEPHRINE HCL 10 MG/ML IJ SOLN
0.0000 ug/min | INTRAMUSCULAR | Status: DC
  Administered 2017-05-15: 10 ug/min via INTRAVENOUS
  Filled 2017-05-15: qty 20

## 2017-05-15 MED ORDER — MIDAZOLAM HCL 2 MG/2ML IJ SOLN
2.0000 mg | INTRAMUSCULAR | Status: DC | PRN
  Administered 2017-05-15 – 2017-05-16 (×3): 2 mg via INTRAVENOUS
  Filled 2017-05-15 (×3): qty 2

## 2017-05-15 MED ORDER — ASPIRIN 300 MG RE SUPP: 300.0000 mg | Freq: Every day | RECTAL | Status: DC

## 2017-05-15 MED ORDER — SODIUM CHLORIDE 0.9 % IV SOLN
1.5000 g | Freq: Two times a day (BID) | INTRAVENOUS | Status: AC
  Administered 2017-05-15 – 2017-05-17 (×4): 1.5 g via INTRAVENOUS
  Filled 2017-05-15 (×4): qty 1.5

## 2017-05-15 MED ORDER — AMINOCAPROIC ACID SOLUTION 5% (50 MG/ML)
5.0000 mL | ORAL | Status: AC
  Administered 2017-05-15 – 2017-05-16 (×12): 5 mL via ORAL
  Filled 2017-05-15: qty 100

## 2017-05-15 MED ORDER — SODIUM CHLORIDE 0.9 % IR SOLN
Status: DC | PRN
  Administered 2017-05-15: 3000 mL

## 2017-05-15 MED ORDER — SODIUM CHLORIDE 0.9 % IV SOLN
INTRAVENOUS | Status: DC
  Administered 2017-05-15: 14:00:00 via INTRAVENOUS

## 2017-05-15 MED ORDER — PROTAMINE SULFATE 10 MG/ML IV SOLN
INTRAVENOUS | Status: AC
  Filled 2017-05-15: qty 5

## 2017-05-15 SURGICAL SUPPLY — 128 items
ADAPTER CARDIO PERF ANTE/RETRO (ADAPTER) IMPLANT
ADAPTER DLP PERFUSION .25INX2I (MISCELLANEOUS) ×4 IMPLANT
ADPR CRDPLG .25X.64 STRL (MISCELLANEOUS) ×2
ADPR PRFSN 84XANTGRD RTRGD (ADAPTER)
ANTEGRADE CPLG (MISCELLANEOUS) IMPLANT
APL SRG 7X2 LUM MLBL SLNT (VASCULAR PRODUCTS) ×2
APPLICATOR TIP COSEAL (VASCULAR PRODUCTS) ×2 IMPLANT
ATTRACTOMAT 16X20 MAGNETIC DRP (DRAPES) ×2 IMPLANT
BAG DECANTER FOR FLEXI CONT (MISCELLANEOUS) ×8 IMPLANT
BLADE STERNUM SYSTEM 6 (BLADE) ×4 IMPLANT
BLADE SURG 12 STRL SS (BLADE) ×4 IMPLANT
BLADE SURG 15 STRL LF DISP TIS (BLADE) IMPLANT
BLADE SURG 15 STRL SS (BLADE)
CANISTER SUCT 3000ML PPV (MISCELLANEOUS) ×4 IMPLANT
CANNULA ARTERIAL NVNT 3/8 20FR (MISCELLANEOUS) ×4 IMPLANT
CANNULA MC2 2 STG 36/46 NON-V (CANNULA) IMPLANT
CANNULA SUMP PERICARDIAL (CANNULA) ×2 IMPLANT
CANNULA VENOUS 2 STG 34/46 (CANNULA) ×2
CANNULA VENOUS LOW PROF 34X46 (CANNULA) ×4 IMPLANT
CATH CPB KIT VANTRIGT (MISCELLANEOUS) IMPLANT
CATH FOLEY 2WAY SLVR  5CC 14FR (CATHETERS) ×2
CATH FOLEY 2WAY SLVR 5CC 14FR (CATHETERS) ×2 IMPLANT
CATH HEART VENT LEFT (CATHETERS) IMPLANT
CATH HYDRAGLIDE XL THORACIC (CATHETERS) ×4 IMPLANT
CATH ROBINSON RED A/P 18FR (CATHETERS) ×8 IMPLANT
CATH THORACIC 28FR (CATHETERS) IMPLANT
CATH THORACIC 36FR RT ANG (CATHETERS) ×2 IMPLANT
CHLORAPREP W/TINT 26ML (MISCELLANEOUS) ×4 IMPLANT
CONN ST 1/4X3/8  BEN (MISCELLANEOUS) ×2
CONN ST 1/4X3/8 BEN (MISCELLANEOUS) IMPLANT
CONT SPEC 4OZ CLIKSEAL STRL BL (MISCELLANEOUS) ×4 IMPLANT
COVER BACK TABLE 60X90IN (DRAPES) ×2 IMPLANT
COVER SURGICAL LIGHT HANDLE (MISCELLANEOUS) ×2 IMPLANT
CRADLE DONUT ADULT HEAD (MISCELLANEOUS) ×4 IMPLANT
DRAIN CHANNEL 28F RND 3/8 FF (WOUND CARE) IMPLANT
DRAIN CHANNEL 32F RND 10.7 FF (WOUND CARE) ×2 IMPLANT
DRAPE BILATERAL SPLIT (DRAPES) ×4 IMPLANT
DRAPE CAMERA CLOSED 9X96 (DRAPES) ×2 IMPLANT
DRAPE CV SPLIT W-CLR ANES SCRN (DRAPES) ×4 IMPLANT
DRAPE INCISE IOBAN 66X45 STRL (DRAPES) ×6 IMPLANT
DRAPE SLUSH/WARMER DISC (DRAPES) ×4 IMPLANT
DRSG AQUACEL AG ADV 3.5X14 (GAUZE/BANDAGES/DRESSINGS) ×4 IMPLANT
ELECT BLADE 4.0 EZ CLEAN MEGAD (MISCELLANEOUS) ×4
ELECT BLADE 6.5 EXT (BLADE) ×4 IMPLANT
ELECT CAUTERY BLADE 6.4 (BLADE) ×4 IMPLANT
ELECT REM PT RETURN 9FT ADLT (ELECTROSURGICAL) ×4
ELECTRODE BLDE 4.0 EZ CLN MEGD (MISCELLANEOUS) ×2 IMPLANT
ELECTRODE REM PT RTRN 9FT ADLT (ELECTROSURGICAL) ×2 IMPLANT
FELT TEFLON 1X6 (MISCELLANEOUS) ×4 IMPLANT
FELT TEFLON 6X6 (MISCELLANEOUS) ×2 IMPLANT
FLOSEAL 10ML (HEMOSTASIS) ×2 IMPLANT
GAUZE SPONGE 4X4 12PLY STRL (GAUZE/BANDAGES/DRESSINGS) ×8 IMPLANT
GAUZE SPONGE 4X4 12PLY STRL LF (GAUZE/BANDAGES/DRESSINGS) ×2 IMPLANT
GLOVE BIO SURGEON STRL SZ 6 (GLOVE) ×2 IMPLANT
GLOVE BIO SURGEON STRL SZ 6.5 (GLOVE) ×1 IMPLANT
GLOVE BIO SURGEON STRL SZ7.5 (GLOVE) ×12 IMPLANT
GLOVE BIO SURGEONS STRL SZ 6.5 (GLOVE) ×1
GLOVE BIOGEL PI IND STRL 6 (GLOVE) IMPLANT
GLOVE BIOGEL PI IND STRL 6.5 (GLOVE) IMPLANT
GLOVE BIOGEL PI INDICATOR 6 (GLOVE) ×8
GLOVE BIOGEL PI INDICATOR 6.5 (GLOVE) ×2
GOWN STRL REUS W/ TWL LRG LVL3 (GOWN DISPOSABLE) ×8 IMPLANT
GOWN STRL REUS W/ TWL XL LVL3 (GOWN DISPOSABLE) ×4 IMPLANT
GOWN STRL REUS W/TWL LRG LVL3 (GOWN DISPOSABLE) ×20
GOWN STRL REUS W/TWL XL LVL3 (GOWN DISPOSABLE) ×4
HEMOSTAT POWDER SURGIFOAM 1G (HEMOSTASIS) ×14 IMPLANT
HEMOSTAT SURGICEL 2X14 (HEMOSTASIS) ×2 IMPLANT
INSERT FOGARTY XLG (MISCELLANEOUS) IMPLANT
KIT BASIN OR (CUSTOM PROCEDURE TRAY) ×4 IMPLANT
KIT LVAD HEARTMATE 3 W-CNTRL (Prosthesis & Implant Heart) ×1 IMPLANT
KIT LVAD HEARTMATE III W-CNTRL (Prosthesis & Implant Heart) ×1 IMPLANT
KIT SUCTION CATH 14FR (SUCTIONS) ×4 IMPLANT
KIT TURNOVER KIT B (KITS) ×4 IMPLANT
LEAD PACING MYOCARDI (MISCELLANEOUS) ×2 IMPLANT
LINE VENT (MISCELLANEOUS) ×2 IMPLANT
NS IRRIG 1000ML POUR BTL (IV SOLUTION) ×16 IMPLANT
PACK OPEN HEART (CUSTOM PROCEDURE TRAY) ×4 IMPLANT
PAD ARMBOARD 7.5X6 YLW CONV (MISCELLANEOUS) ×8 IMPLANT
PAD DEFIB R2 (MISCELLANEOUS) ×4 IMPLANT
POWDER SURGICEL 3.0 GRAM (HEMOSTASIS) ×2 IMPLANT
PUNCH AORTIC ROTATE 4.5MM 8IN (MISCELLANEOUS) ×4 IMPLANT
SEALANT SURG COSEAL 8ML (VASCULAR PRODUCTS) ×4 IMPLANT
SET CARDIOPLEGIA MPS 5001102 (MISCELLANEOUS) ×2 IMPLANT
SHEATH AVANTI 11CM 5FR (SHEATH) IMPLANT
SPONGE LAP 18X18 X RAY DECT (DISPOSABLE) ×2 IMPLANT
STOPCOCK 4 WAY LG BORE MALE ST (IV SETS) ×2 IMPLANT
SUCKER INTRACARDIAC WEIGHTED (SUCKER) ×4 IMPLANT
SUT BONE WAX W31G (SUTURE) ×2 IMPLANT
SUT ETHIBOND 2 0 SH (SUTURE) ×16
SUT ETHIBOND 2 0 SH 36X2 (SUTURE) ×10 IMPLANT
SUT ETHIBOND 5 LR DA (SUTURE) ×4 IMPLANT
SUT ETHIBOND NAB MH 2-0 36IN (SUTURE) ×52 IMPLANT
SUT PROLENE 3 0 RB 1 (SUTURE) IMPLANT
SUT PROLENE 3 0 SH DA (SUTURE) ×10 IMPLANT
SUT PROLENE 4 0 RB 1 (SUTURE) ×16
SUT PROLENE 4-0 RB1 .5 CRCL 36 (SUTURE) ×8 IMPLANT
SUT PROLENE 5 0 C1 (SUTURE) ×4 IMPLANT
SUT PROLENE 6 0 C 1 30 (SUTURE) ×4 IMPLANT
SUT SILK  1 MH (SUTURE) ×8
SUT SILK 1 MH (SUTURE) ×8 IMPLANT
SUT SILK 1 TIES 10X30 (SUTURE) ×4 IMPLANT
SUT SILK 2 0 SH CR/8 (SUTURE) ×8 IMPLANT
SUT SILK 3 0 SH CR/8 (SUTURE) ×2 IMPLANT
SUT STEEL 6MS V (SUTURE) ×6 IMPLANT
SUT STEEL SZ 6 DBL 3X14 BALL (SUTURE) ×4 IMPLANT
SUT TEM PAC WIRE 2 0 SH (SUTURE) ×4 IMPLANT
SUT VIC AB 1 CTX 36 (SUTURE) ×16
SUT VIC AB 1 CTX36XBRD ANBCTR (SUTURE) ×4 IMPLANT
SUT VIC AB 2-0 CTX 27 (SUTURE) ×8 IMPLANT
SUT VIC AB 3-0 SH 8-18 (SUTURE) ×4 IMPLANT
SUT VIC AB 3-0 X1 27 (SUTURE) ×10 IMPLANT
SUT VICRYL 2 TP 1 (SUTURE) IMPLANT
SYR 50ML LL SCALE MARK (SYRINGE) ×2 IMPLANT
SYSTEM SAHARA CHEST DRAIN ATS (WOUND CARE) ×4 IMPLANT
TAPE CLOTH SURG 4X10 WHT LF (GAUZE/BANDAGES/DRESSINGS) ×2 IMPLANT
TAPE PAPER 2X10 WHT MICROPORE (GAUZE/BANDAGES/DRESSINGS) ×2 IMPLANT
TAPE STRIPS DRAPE STRL (GAUZE/BANDAGES/DRESSINGS) ×4 IMPLANT
TOWEL GREEN STERILE (TOWEL DISPOSABLE) ×4 IMPLANT
TOWEL GREEN STERILE FF (TOWEL DISPOSABLE) ×4 IMPLANT
TRAY CATH LUMEN 1 20CM STRL (SET/KITS/TRAYS/PACK) ×4 IMPLANT
TRAY FOLEY SLVR 14FR TEMP STAT (SET/KITS/TRAYS/PACK) ×2 IMPLANT
TUBE CONNECTING 12'X1/4 (SUCTIONS) ×1
TUBE CONNECTING 12X1/4 (SUCTIONS) ×3 IMPLANT
TUBE SUCT INTRACARD DLP 20F (MISCELLANEOUS) ×2 IMPLANT
UNDERPAD 30X30 (UNDERPADS AND DIAPERS) ×4 IMPLANT
VENT LEFT HEART 12002 (CATHETERS)
WATER STERILE IRR 1000ML POUR (IV SOLUTION) ×8 IMPLANT
YANKAUER SUCT BULB TIP NO VENT (SUCTIONS) ×4 IMPLANT

## 2017-05-15 NOTE — Anesthesia Procedure Notes (Signed)
Central Venous Catheter Insertion Performed by: Kipp Brood, MD, anesthesiologist Start/End4/30/2019 8:05 AM, 05/15/2017 8:15 AM Patient location: Pre-op. Preanesthetic checklist: patient identified, IV checked, site marked, risks and benefits discussed, surgical consent, monitors and equipment checked, pre-op evaluation, timeout performed and anesthesia consent Lidocaine 1% used for infiltration and patient sedated Hand hygiene performed  and maximum sterile barriers used  Catheter size: 8 Fr Total catheter length 16. Central line was placed.Double lumen Procedure performed using ultrasound guided technique. Ultrasound Notes:image(s) printed for medical record Attempts: 1 Following insertion, dressing applied and line sutured. Post procedure assessment: blood return through all ports  Patient tolerated the procedure well with no immediate complications.

## 2017-05-15 NOTE — Anesthesia Procedure Notes (Signed)
Arterial Line Insertion Start/End4/30/2019 9:00 AM, 05/15/2017 9:10 AM Performed by: CRNA  Patient location: OR. Preanesthetic checklist: patient identified, IV checked, site marked, risks and benefits discussed, surgical consent, monitors and equipment checked, pre-op evaluation, timeout performed and anesthesia consent Left, radial was placed Catheter size: 20 G Hand hygiene performed , maximum sterile barriers used  and Seldinger technique used Allen's test indicative of satisfactory collateral circulation Attempts: 1 Procedure performed without using ultrasound guided technique. Following insertion, dressing applied and Biopatch. Post procedure assessment: normal  Patient tolerated the procedure well with no immediate complications.

## 2017-05-15 NOTE — Brief Op Note (Signed)
04/21/2017 - 05/15/2017  11:52 AM  PATIENT:  Benjamin Sherman  47 y.o. male  PRE-OPERATIVE DIAGNOSIS:  ICM  POST-OPERATIVE DIAGNOSIS:  ICM  PROCEDURE:  Insertion of HeartMate 3 LVAD  SURGEON:  Surgeon(s) and Role:    Kerin Perna, MD - Primary    * Bartle, Payton Doughty, MD - Assisting  PHYSICIAN ASSISTANT: none  ASSISTANTS: Benay Spice, RNFA   ANESTHESIA:   general   BLOOD ADMINISTERED:4 units  FFP   SPECIMEN:  Source of Specimen:  apical myocardial plug  DISPOSITION OF SPECIMEN:  PATHOLOGY  COUNTS:  YES  TOURNIQUET:  * No tourniquets in log *  DICTATION: .Note written in EPIC  PLAN OF CARE: Admit to inpatient   PATIENT DISPOSITION:  ICU - intubated and critically ill.   Delay start of Pharmacological VTE agent (>24hrs) due to surgical blood loss or risk of bleeding: yes

## 2017-05-15 NOTE — Anesthesia Procedure Notes (Signed)
Central Venous Catheter Insertion Performed by: Kipp Brood, MD, anesthesiologist Start/End4/30/2019 8:05 AM, 05/15/2017 8:15 AM Patient location: Pre-op. Preanesthetic checklist: patient identified, IV checked, site marked, risks and benefits discussed, surgical consent, monitors and equipment checked, pre-op evaluation, timeout performed and anesthesia consent Lidocaine 1% used for infiltration and patient sedated Hand hygiene performed  and maximum sterile barriers used  Catheter size: 8.5 Fr Sheath introducer Procedure performed using ultrasound guided technique. Ultrasound Notes:anatomy identified, needle tip was noted to be adjacent to the nerve/plexus identified, no ultrasound evidence of intravascular and/or intraneural injection and image(s) printed for medical record Attempts: 1 Following insertion, line sutured and dressing applied. Post procedure assessment: blood return through all ports, free fluid flow and no air  Patient tolerated the procedure well with no immediate complications.

## 2017-05-15 NOTE — Progress Notes (Signed)
Pharmacy Antibiotic Note  Benjamin Sherman is a 47 y.o. male admitted on 04/21/2017 with surgical prophylaxis.  Pharmacy has been consulted for vancomycin post-op.  Plan: Vancomycin 1g IV every 12 hours.  Goal trough 10-15 mcg/mL.  Continue zinacef as ordered.   Height: 6' (182.9 cm) Weight: 195 lb 5.2 oz (88.6 kg) IBW/kg (Calculated) : 77.6  Temp (24hrs), Avg:97.3 F (36.3 C), Min:96.4 F (35.8 C), Max:97.7 F (36.5 C)  Recent Labs  Lab 05/11/17 0350 05/12/17 0425 05/13/17 0436 05/14/17 0227 05/15/17 0400 05/15/17 0849 05/15/17 0950 05/15/17 1014 05/15/17 1045 05/15/17 1200  WBC 12.4* 12.6* 10.5 8.4 6.5  --   --   --   --   --   CREATININE 1.83* 1.55* 1.39* 1.47* 1.35* 1.10 1.10 1.00 1.10 1.00    Estimated Creatinine Clearance: 99.2 mL/min (by C-G formula based on SCr of 1 mg/dL).    Allergies  Allergen Reactions  . Bee Venom     UNSPECIFIED REACTION      Thank you for allowing pharmacy to be a part of this patient's care.  Sheppard Coil PharmD., BCPS Clinical Pharmacist 05/15/2017 1:36 PM

## 2017-05-15 NOTE — Anesthesia Procedure Notes (Signed)
Central Venous Catheter Insertion Performed by: Kipp Brood, MD, anesthesiologist Start/End4/30/2019 8:05 AM, 05/15/2017 8:15 AM Patient location: Pre-op. Preanesthetic checklist: patient identified, IV checked, site marked, risks and benefits discussed, surgical consent, monitors and equipment checked, pre-op evaluation, timeout performed and anesthesia consent Hand hygiene performed  and maximum sterile barriers used  PA cath was placed.Swan type:thermodilution Procedure performed without using ultrasound guided technique. Attempts: 1 Following insertion, line sutured, dressing applied and Biopatch. Post procedure assessment: blood return through all ports, free fluid flow and no air  Patient tolerated the procedure well with no immediate complications.

## 2017-05-15 NOTE — Progress Notes (Signed)
IABP removed from LFA, manual pressure applied for 30 minutes. Groin level 0. Patient sedated and intubated. Tegaderm dressing applied.   Bedrest begins at 17:30:00

## 2017-05-15 NOTE — Transfer of Care (Signed)
Immediate Anesthesia Transfer of Care Note  Patient: Benjamin Sherman  Procedure(s) Performed: INSERTION OF IMPLANTABLE LEFT VENTRICULAR ASSIST DEVICE-HM3 (N/A Chest) TRANSESOPHAGEAL ECHOCARDIOGRAM (TEE) (N/A )  Patient Location: SICU  Anesthesia Type:General  Level of Consciousness: sedated and Patient remains intubated per anesthesia plan  Airway & Oxygen Therapy: Patient remains intubated per anesthesia plan and Patient placed on Ventilator (see vital sign flow sheet for setting)  Post-op Assessment: Report given to RN and Post -op Vital signs reviewed and stable  Post vital signs: Reviewed and stable  Last Vitals:  Vitals Value Taken Time  BP    Temp    Pulse 92 05/15/2017  1:44 PM  Resp 14 05/15/2017  1:44 PM  SpO2 94 % 05/15/2017  1:44 PM  Vitals shown include unvalidated device data.  Last Pain:  Vitals:   05/15/17 0500  TempSrc:   PainSc: 0-No pain      Patients Stated Pain Goal: 0 (05/13/17 2015)  Complications: No apparent anesthesia complications

## 2017-05-15 NOTE — Progress Notes (Signed)
Day of Surgery Procedure(s) (LRB): INSERTION OF IMPLANTABLE LEFT VENTRICULAR ASSIST DEVICE-HM3 (N/A) TRANSESOPHAGEAL ECHOCARDIOGRAM (TEE) (N/A) Subjective: Hemorrhage from R upper gum open socket after extraction of infected tooth last week- following heparin for CPB- VAD implant. Topical surgicell  Sheet and powder w/o improvement - Hb dropped 9 >> 7 and PBRCs ordered. Will try MRDH application and  then low dose factor seven if bleeding continues  Objective: Vital signs in last 24 hours: Temp:  [96.1 F (35.6 C)-97.7 F (36.5 C)] 96.4 F (35.8 C) (04/30 1850) Pulse Rate:  [79-92] 79 (04/30 1850) Cardiac Rhythm: Normal sinus rhythm (04/30 1600) Resp:  [12-20] 14 (04/30 1850) BP: (117-139)/(67-97) 132/71 (04/30 0600) SpO2:  [93 %-100 %] 100 % (04/30 1650) FiO2 (%):  [50 %] 50 % (04/30 1800) Weight:  [195 lb 5.2 oz (88.6 kg)] 195 lb 5.2 oz (88.6 kg) (04/30 0500)  Hemodynamic parameters for last 24 hours: PAP: (17-29)/(4-15) 17/4 CVP:  [1 mmHg-13 mmHg] 4 mmHg CO:  [5.8 L/min-6.5 L/min] 6.5 L/min CI:  [2.8 L/min/m2-3.1 L/min/m2] 3.1 L/min/m2  Intake/Output from previous day: 04/29 0701 - 04/30 0700 In: 1340.1 [P.O.:720; I.V.:620.1] Out: 250 [Urine:250] Intake/Output this shift: Total I/O In: 5270.6 [I.V.:2044.6; Blood:1596; NG/GT:30; IV Piggyback:1600] Out: 1935 [Urine:640; Blood:915; Chest Tube:380]  Large amt of clotted /unclotted blood in mouth  Lab Results: Recent Labs    05/15/17 0400  05/15/17 1328 05/15/17 1350 05/15/17 1552 05/15/17 1830  WBC 6.5  --  9.8  --   --   --   HGB 13.0   < > 8.9* 9.5*  --  7.5*  HCT 39.0   < > 27.2* 28.0*  --  22.0*  PLT 227   < > 188  --  208  --    < > = values in this interval not displayed.   BMET:  Recent Labs    05/15/17 0400  05/15/17 1552 05/15/17 1830  NA 134*   < > 138 140  K 4.1   < > 4.0 4.4  CL 97*   < > 106 103  CO2 26  --  26  --   GLUCOSE 163*   < > 124* 142*  BUN 38*   < > 30* 27*  CREATININE 1.35*   < >  1.36* 1.20  CALCIUM 9.6  --  8.3*  --    < > = values in this interval not displayed.    PT/INR:  Recent Labs    05/15/17 1552  LABPROT 17.0*  INR 1.39   ABG    Component Value Date/Time   PHART 7.412 05/15/2017 1835   HCO3 25.4 05/15/2017 1835   TCO2 27 05/15/2017 1835   ACIDBASEDEF 3.0 (H) 04/25/2017 1712   O2SAT 100.0 05/15/2017 1835   CBG (last 3)  Recent Labs    05/15/17 1403 05/15/17 1635 05/15/17 1754  GLUCAP 134* 129* 110*    Assessment/Plan: S/P Procedure(s) (LRB): INSERTION OF IMPLANTABLE LEFT VENTRICULAR ASSIST DEVICE-HM3 (N/A) TRANSESOPHAGEAL ECHOCARDIOGRAM (TEE) (N/A) Pressure on bleeding site personally held for 40 min Check CBC every 4 h  LOS: 24 days    Kathlee Nations Trigt III 05/15/2017

## 2017-05-15 NOTE — Anesthesia Procedure Notes (Addendum)
Procedure Name: Intubation Date/Time: 05/15/2017 7:58 AM Performed by: Renato Shin, CRNA Pre-anesthesia Checklist: Patient identified, Emergency Drugs available, Suction available, Patient being monitored and Timeout performed Patient Re-evaluated:Patient Re-evaluated prior to induction Oxygen Delivery Method: Circle system utilized Preoxygenation: Pre-oxygenation with 100% oxygen Induction Type: IV induction Ventilation: Mask ventilation without difficulty Laryngoscope Size: Mac and 3 Grade View: Grade I Tube type: Oral Number of attempts: 1 Airway Equipment and Method: Stylet Placement Confirmation: ETT inserted through vocal cords under direct vision,  positive ETCO2,  CO2 detector and breath sounds checked- equal and bilateral Secured at: 20 cm Tube secured with: Tape Dental Injury: Teeth and Oropharynx as per pre-operative assessment

## 2017-05-15 NOTE — Progress Notes (Signed)
Pre Procedure note for inpatients:   Benjamin Sherman has been scheduled for Procedure(s) with comments: INSERTION OF IMPLANTABLE LEFT VENTRICULAR ASSIST DEVICE-HM3 (N/A) - HM3  CIRC ARREST  NITRIC OXIDE TRANSESOPHAGEAL ECHOCARDIOGRAM (TEE) (N/A) today. The various methods of treatment have been discussed with the patient. After consideration of the risks, benefits and treatment options the patient has consented to the planned procedure.   The patient has been seen and labs reviewed. There are no changes in the patient's condition to prevent proceeding with the planned procedure today.  Recent labs:  Lab Results  Component Value Date   WBC 6.5 05/15/2017   HGB 13.0 05/15/2017   HCT 39.0 05/15/2017   PLT 227 05/15/2017   GLUCOSE 163 (H) 05/15/2017   CHOL 156 05/04/2017   TRIG 67 05/04/2017   HDL 64 05/04/2017   LDLCALC 79 05/04/2017   ALT 18 05/11/2017   AST 27 05/11/2017   NA 134 (L) 05/15/2017   K 4.1 05/15/2017   CL 97 (L) 05/15/2017   CREATININE 1.35 (H) 05/15/2017   BUN 38 (H) 05/15/2017   CO2 26 05/15/2017   TSH 10.848 (H) 05/08/2017   INR 1.16 05/11/2017   HGBA1C 7.7 (H) 05/08/2017    Mikey Bussing, MD 05/15/2017 7:45 AM

## 2017-05-15 NOTE — Anesthesia Preprocedure Evaluation (Signed)
Anesthesia Evaluation  Patient identified by MRN, date of birth, ID band Patient awake    Reviewed: Allergy & Precautions, NPO status , Patient's Chart, lab work & pertinent test results  Airway Mallampati: II  TM Distance: >3 FB     Dental  (+) Edentulous Upper, Edentulous Lower   Pulmonary former smoker,     + decreased breath sounds      Cardiovascular  Rhythm:Regular Rate:Normal     Neuro/Psych    GI/Hepatic   Endo/Other  diabetes  Renal/GU      Musculoskeletal   Abdominal   Peds  Hematology   Anesthesia Other Findings   Reproductive/Obstetrics                             Anesthesia Physical Anesthesia Plan  ASA: IV  Anesthesia Plan: General   Post-op Pain Management:    Induction: Intravenous  PONV Risk Score and Plan: Ondansetron and Dexamethasone  Airway Management Planned: Oral ETT  Additional Equipment: PA Cath, CVP, Arterial line, 3D TEE and Ultrasound Guidance Line Placement  Intra-op Plan:   Post-operative Plan: Post-operative intubation/ventilation  Informed Consent: I have reviewed the patients History and Physical, chart, labs and discussed the procedure including the risks, benefits and alternatives for the proposed anesthesia with the patient or authorized representative who has indicated his/her understanding and acceptance.     Plan Discussed with: CRNA and Anesthesiologist  Anesthesia Plan Comments:         Anesthesia Quick Evaluation

## 2017-05-15 NOTE — Progress Notes (Signed)
CSW met with family in the waiting room. Family pleased with updates throughout the day and relieved to see patient post op. Patient's sister noted "he color is even improved already". Family asking appropriate questions and plan to return tomorrow late morning. CSW provided support throughout the day and will continue to follow during implant hospitalization.Raquel Sarna, [place], [place]

## 2017-05-15 NOTE — Anesthesia Postprocedure Evaluation (Signed)
Anesthesia Post Note  Patient: Benjamin Sherman  Procedure(s) Performed: INSERTION OF IMPLANTABLE LEFT VENTRICULAR ASSIST DEVICE-HM3 (N/A Chest) TRANSESOPHAGEAL ECHOCARDIOGRAM (TEE) (N/A )     Patient location during evaluation: SICU Anesthesia Type: General Level of consciousness: sedated and patient remains intubated per anesthesia plan Pain management: pain level controlled Vital Signs Assessment: post-procedure vital signs reviewed and stable Respiratory status: patient remains intubated per anesthesia plan and patient on ventilator - see flowsheet for VS Cardiovascular status: stable and blood pressure returned to baseline Postop Assessment: no apparent nausea or vomiting Anesthetic complications: no    Last Vitals:  Vitals:   05/15/17 1650 05/15/17 1700  BP:    Pulse: 81   Resp: 12 12  Temp:  (!) 35.7 C  SpO2: 100%     Last Pain:  Vitals:   05/15/17 1600  TempSrc: Core (Comment)  PainSc:                  Benjamin Sherman

## 2017-05-15 NOTE — Progress Notes (Signed)
Patient ID: Benjamin Sherman, male   DOB: Aug 21, 1970, 47 y.o.   MRN: [id number]     Advanced Heart Failure Rounding Note  Subjective:    Events:  -Admitted 04/21/17 with cardiogenic shock and atrial flutter.  -Central line placed. Initial co-ox 39%. -IABP placed 04/25/17-> 04/30/17 with persistent cardiogenic shock.  -DCCV atrial flutter 4/12.   - S/p multiple teeth extractions 05/11/17.   IABP and swan placed yesterday. Feels ok. Denies SOB or orthopnea. Mouth bleeding has stopped.   CVP 6 PA 40/18 Thermo 4.1/2.0 Co-ox 68%  Echo 4/8: LVEF 20-25% with LV apical clot. RV mild to moderately down.  Repeat echo 4/19 unchanged RV moderately down   Objective:   Weight Range:  Vital Signs:   Temp:  [96.4 F (35.8 C)-98.2 F (36.8 C)] 97.5 F (36.4 C) (04/30 0500) Pulse Rate:  [0-117] 92 (04/30 0500) Resp:  [0-47] 19 (04/30 0500) BP: (91-139)/(50-97) 117/86 (04/30 0500) SpO2:  [0 %-100 %] 97 % (04/30 0500) Weight:  [88.6 kg (195 lb 5.2 oz)] 88.6 kg (195 lb 5.2 oz) (04/30 0500) Last BM Date: 05/13/17  Weight change: Filed Weights   05/12/17 0417 05/14/17 0425 05/15/17 0500  Weight: 89.2 kg (196 lb 10.4 oz) 86.6 kg (190 lb 14.4 oz) 88.6 kg (195 lb 5.2 oz)   Intake/Output:   Intake/Output Summary (Last 24 hours) at 05/15/2017 0612 Last data filed at 05/15/2017 0500 Gross per 24 hour  Intake 1407.39 ml  Output 250 ml  Net 1157.39 ml    Physical Exam   General:  Lying in bed No resp difficulty HEENT: normal Neck: supple. RIJ swan CVP 6 Carotids 2+ bilat; no bruits. No lymphadenopathy or thryomegaly appreciated. Cor: PMI laterally displaced. Regular rate & rhythm. +s3 Lungs: clear Abdomen: soft, nontender, nondistended. No hepatosplenomegaly. No bruits or masses. Good bowel sounds. Extremities: no cyanosis, clubbing, rash, edema L groin IABP  Neuro: alert & orientedx3, cranial nerves grossly intact. moves all 4 extremities w/o difficulty. Affect pleasant   Telemetry    NSR  80-90s. Personally reviewed.   EKG   No new tracings.   Labs    Basic Metabolic Panel: Recent Labs  Lab 05/09/17 0414 05/10/17 0544 05/11/17 0350 05/12/17 0425 05/13/17 0436 05/14/17 0227 05/15/17 0400  NA 136 133* 133* 135 135 134* 134*  K 4.1 4.1 4.4 4.0 4.0 4.3 4.1  CL 100* 103 98* 100* 99* 95* 97*  CO2 27 22 23 26 29 29 26   GLUCOSE 89 136* 155* 122* 105* 119* 163*  BUN 28* 34* 37* 45* 47* 49* 38*  CREATININE 1.70* 1.70* 1.83* 1.55* 1.39* 1.47* 1.35*  CALCIUM 9.0 8.9 9.6 9.6 9.3 9.8 9.6  MG 2.2 1.9 2.2  --   --  1.8  --    Liver Function Tests: Recent Labs  Lab 05/11/17 0350  AST 27  ALT 18  ALKPHOS 84  BILITOT 1.1  PROT 9.2*  ALBUMIN 3.0*   No results for input(s): LIPASE, AMYLASE in the last 168 hours. No results for input(s): AMMONIA in the last 168 hours.  CBC: Recent Labs  Lab 05/11/17 0350 05/12/17 0425 05/13/17 0436 05/14/17 0227 05/15/17 0400  WBC 12.4* 12.6* 10.5 8.4 6.5  HGB 13.3 13.1 13.9 14.1 13.0  HCT 40.7 39.7 41.9 42.4 39.0  MCV 86.0 85.7 85.5 85.1 85.7  PLT 335 335 340 314 227   Cardiac Enzymes: No results for input(s): CKTOTAL, CKMB, CKMBINDEX, TROPONINI in the last 168 hours. BNP: BNP (last 3 results) Recent  Labs    11/06/16 1621 03/25/17 1401 04/21/17 1212  BNP 961.6* 835.4* 483.0*   ProBNP (last 3 results) No results for input(s): PROBNP in the last 8760 hours.  Other results:  Imaging: Dg Chest Port 1 View  Result Date: 05/14/2017 CLINICAL DATA:  Aortic balloon pump assistance. History of CHF and diabetes. EXAM: PORTABLE CHEST 1 VIEW COMPARISON:  Chest radiograph April 10, 2017 FINDINGS: Interval placement of Swan-Ganz catheter via RIGHT internal jugular venous approach with distal tip projecting in main pulmonary artery. Marker indicating aortic balloon assist device projects in proximal descending aorta. RIGHT PICC distal tip projects in distal superior vena cava, stable. Cardiomediastinal silhouette is unremarkable  for this low inspiratory examination with crowded vasculature markings. The lungs are clear without pleural effusions or focal consolidations. Minimal RIGHT lung base atelectasis. Trachea projects midline and there is no pneumothorax. Included soft tissue planes and osseous structures are non-suspicious. IMPRESSION: Interval placement of Swan-Ganz catheter via RIGHT internal jugular venous approach with distal tip projecting in main pulmonary artery. Marker indicating aortic balloon assist device projects in proximal descending aorta. No pneumothorax. Minimal RIGHT lung base atelectasis. Electronically Signed   By: Awilda Metro M.D.   On: 05/14/2017 13:33    Medications:    Scheduled Medications: . allopurinol  300 mg Oral Daily  . amiodarone  200 mg Oral BID  . Chlorhexidine Gluconate Cloth  6 each Topical Daily  . digoxin  0.125 mg Oral Daily  . feeding supplement (PRO-STAT SUGAR FREE 64)  30 mL Oral BID  . insulin aspart  0-15 Units Subcutaneous TID WC  . insulin aspart  0-5 Units Subcutaneous QHS  . insulin glargine  10 Units Subcutaneous QHS  . magnesium sulfate  40 mEq Other To OR  . nutrition supplement (JUVEN)  1 packet Oral BID BM  . potassium chloride  80 mEq Other To OR  . sodium chloride flush  10-40 mL Intracatheter Q12H  . sodium chloride flush  3 mL Intravenous Q12H  . spironolactone  12.5 mg Oral Daily  . tranexamic acid  15 mg/kg Intravenous To OR  . tranexamic acid  2 mg/kg Intracatheter To OR  . vancomycin  1,000 mg Other To OR    Infusions: . sodium chloride 10 mL/hr at 05/15/17 0500  . sodium chloride 10 mL/hr at 05/15/17 0500  . cefUROXime (ZINACEF)  IV    . cefUROXime (ZINACEF)  IV    . dexmedetomidine    . DOBUTamine    . DOPamine    . epinephrine    . fluconazole (DIFLUCAN) IV    . heparin 30,000 units/NS 1000 mL solution for CELLSAVER    . insulin (NOVOLIN-R) infusion    . lactated ringers Stopped (05/10/17 1900)  . lactated ringers    . milrinone  0.375 mcg/kg/min (05/15/17 0500)  . milrinone    . nitroGLYCERIN    . norepinephrine (LEVOPHED) Adult infusion    . phenylephrine 20mg /253mL NS (0.08mg /ml) infusion    . rifampin (RIFADIN) IVPB    . tranexamic acid (CYKLOKAPRON) infusion ([facility])    . vancomycin    . vasopressin (PITRESSIN) infusion - *FOR SHOCK*      PRN Medications:    Assessment:   Jemery Umstead is a 47 y.o. 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 eosinophilic cardiomyopathy, LV thrombus, hypothyroidism, DM2, CKD Stage II-III and traumatic [facility] 11/18 after fall.   Admitted with cardiogenic shock.  Plan/Discussion:    1. Acute on  chronic systolic HF -> cardiogenic shock: ECHO 11/2016 EF 25-30% CMRI EF 22%. Bedside echo in HF clinic EF 20% in 3/19.  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 Did not respond to steroids. Admitted 4/6 with cardiogenic shock and volume overload co-ox 38%. Echo 04/23/17: LVEF 20-25% with large LV clot, RV mild to moderately down. - Coox 68% this am on milrinone 0.375 mcg/kg/min and IABP - Swan #s viewed personally. He is optimized for VAD - Continue digoxin 0.125.  - Continue spiro 12.5 mg daily. - No ACE/ARB/b-blocker with shock - Echo reviewed personally with LVEF 20% moderate RV dysfunction   - For VAD today. Surgery d/w him and family  2. Atrial flutter: New onset. S/p DCCV to NSR on 4/12.  - Remains in NSR s/p DCCV. Continue amiodarone  - Off heparin due to oral bleeding 3. AKI CKD Stage II-III: Likely ATN due to shock - Creatinine 1.25 today   Improved with supprot 4. H/o LV Thrombus - now recurrent on echo 04/23/17: Off Xarelto due to previous SAH.  - On heparin. No change 5. DMII - Cover with SSI plus lantus.  - No change 6. Hypothyroidism: TFTs OK 04/22/17.  - TSH elevated at 10.848 4/23. T4 and T3 normal. No change 7. RLE erythema/cellulitis - Started doxy 4/20. Resolved.  - Has received steroids for gout flare. Uric acid 11.5 -  On allopurinol 8. Blood clot in mouth - Hemoglobin stable. Holding heparin    CRITICAL CARE Performed by: [name]  Total critical care time: 35 minutes  Critical care time was exclusive of separately billable procedures and treating other patients.  Critical care was necessary to treat or prevent imminent or life-threatening deterioration.  Critical care was time spent personally by me (independent of midlevel providers or residents) on the following activities: development of treatment plan with patient and/or surrogate as well as nursing, discussions with consultants, evaluation of patient's response to treatment, examination of patient, obtaining history from patient or surrogate, ordering and performing treatments and interventions, ordering and review of laboratory studies, ordering and review of radiographic studies, pulse oximetry and re-evaluation of patient's condition.   [name], MD  05/15/2017 6:12 AM   Advanced Heart Failure Team Pager [phone] (M-F; 7a - 4p)  Please contact [facility] for night-coverage after hours (4p -7a ) and weekends on [email or web address]

## 2017-05-16 ENCOUNTER — Inpatient Hospital Stay (HOSPITAL_COMMUNITY): Payer: Medicaid Other

## 2017-05-16 ENCOUNTER — Encounter (HOSPITAL_COMMUNITY): Payer: Self-pay | Admitting: Cardiothoracic Surgery

## 2017-05-16 DIAGNOSIS — Z95811 Presence of heart assist device: Secondary | ICD-10-CM

## 2017-05-16 LAB — BPAM FFP
Blood Product Expiration Date: 201904302359
Blood Product Expiration Date: 201904302359
Blood Product Expiration Date: 201904302359
Blood Product Expiration Date: 201904302359
Blood Product Expiration Date: 201905052359
Blood Product Expiration Date: 201905052359
ISSUE DATE / TIME: 201904301005
ISSUE DATE / TIME: 201904301005
ISSUE DATE / TIME: 201904301005
ISSUE DATE / TIME: 201904301005
ISSUE DATE / TIME: 201904301809
ISSUE DATE / TIME: 201904301809
Unit Type and Rh: 6200
Unit Type and Rh: 6200
Unit Type and Rh: 6200
Unit Type and Rh: 6200
Unit Type and Rh: 6200
Unit Type and Rh: 6200

## 2017-05-16 LAB — CBC
HCT: 27.4 % — ABNORMAL LOW (ref 39.0–52.0)
HCT: 30.6 % — ABNORMAL LOW (ref 39.0–52.0)
Hemoglobin: 8.9 g/dL — ABNORMAL LOW (ref 13.0–17.0)
Hemoglobin: 9.9 g/dL — ABNORMAL LOW (ref 13.0–17.0)
MCH: 27.7 pg (ref 26.0–34.0)
MCH: 27.7 pg (ref 26.0–34.0)
MCHC: 32.5 g/dL (ref 30.0–36.0)
MCHC: 32.4 g/dL (ref 30.0–36.0)
MCV: 85.4 fL (ref 78.0–100.0)
MCV: 85.5 fL (ref 78.0–100.0)
Platelets: 163 10*3/uL (ref 150–400)
Platelets: 160 10*3/uL (ref 150–400)
RBC: 3.21 MIL/uL — ABNORMAL LOW (ref 4.22–5.81)
RBC: 3.58 MIL/uL — ABNORMAL LOW (ref 4.22–5.81)
RDW: 15.8 % — ABNORMAL HIGH (ref 11.5–15.5)
RDW: 15.8 % — ABNORMAL HIGH (ref 11.5–15.5)
WBC: 8.4 10*3/uL (ref 4.0–10.5)
WBC: 16.5 10*3/uL — ABNORMAL HIGH (ref 4.0–10.5)

## 2017-05-16 LAB — PREPARE FRESH FROZEN PLASMA
Unit division: 0
Unit division: 0
Unit division: 0

## 2017-05-16 LAB — POCT I-STAT 3, ART BLOOD GAS (G3+)
Acid-Base Excess: 1 mmol/L (ref 0.0–2.0)
Acid-base deficit: 2 mmol/L (ref 0.0–2.0)
Acid-base deficit: 3 mmol/L — ABNORMAL HIGH (ref 0.0–2.0)
Acid-base deficit: 3 mmol/L — ABNORMAL HIGH (ref 0.0–2.0)
Bicarbonate: 24.8 mmol/L (ref 20.0–28.0)
Bicarbonate: 23.1 mmol/L (ref 20.0–28.0)
Bicarbonate: 25.2 mmol/L (ref 20.0–28.0)
Bicarbonate: 22 mmol/L (ref 20.0–28.0)
Bicarbonate: 21.6 mmol/L (ref 20.0–28.0)
O2 Saturation: 100 %
O2 Saturation: 100 %
O2 Saturation: 100 %
O2 Saturation: 99 %
O2 Saturation: 98 %
Patient temperature: 38.3
Patient temperature: 38
Patient temperature: 36.8
TCO2: 26 mmol/L (ref 22–32)
TCO2: 24 mmol/L (ref 22–32)
TCO2: 26 mmol/L (ref 22–32)
TCO2: 23 mmol/L (ref 22–32)
TCO2: 23 mmol/L (ref 22–32)
pCO2 arterial: 42.6 mmHg (ref 32.0–48.0)
pCO2 arterial: 39.4 mmHg (ref 32.0–48.0)
pCO2 arterial: 38.6 mmHg (ref 32.0–48.0)
pCO2 arterial: 38.2 mmHg (ref 32.0–48.0)
pCO2 arterial: 36.1 mmHg (ref 32.0–48.0)
pH, Arterial: 7.378 (ref 7.350–7.450)
pH, Arterial: 7.38 (ref 7.350–7.450)
pH, Arterial: 7.424 (ref 7.350–7.450)
pH, Arterial: 7.367 (ref 7.350–7.450)
pH, Arterial: 7.384 (ref 7.350–7.450)
pO2, Arterial: 209 mmHg — ABNORMAL HIGH (ref 83.0–108.0)
pO2, Arterial: 218 mmHg — ABNORMAL HIGH (ref 83.0–108.0)
pO2, Arterial: 184 mmHg — ABNORMAL HIGH (ref 83.0–108.0)
pO2, Arterial: 160 mmHg — ABNORMAL HIGH (ref 83.0–108.0)
pO2, Arterial: 112 mmHg — ABNORMAL HIGH (ref 83.0–108.0)

## 2017-05-16 LAB — CBC WITH DIFFERENTIAL/PLATELET
Band Neutrophils: 18 %
Basophils Absolute: 0 10*3/uL (ref 0.0–0.1)
Basophils Relative: 0 %
Blasts: 0 %
Eosinophils Absolute: 0 10*3/uL (ref 0.0–0.7)
Eosinophils Relative: 0 %
HCT: 30.4 % — ABNORMAL LOW (ref 39.0–52.0)
Hemoglobin: 9.7 g/dL — ABNORMAL LOW (ref 13.0–17.0)
Lymphocytes Relative: 9 %
Lymphs Abs: 1 10*3/uL (ref 0.7–4.0)
MCH: 27.5 pg (ref 26.0–34.0)
MCHC: 31.9 g/dL (ref 30.0–36.0)
MCV: 86.1 fL (ref 78.0–100.0)
Metamyelocytes Relative: 0 %
Monocytes Absolute: 0.4 10*3/uL (ref 0.1–1.0)
Monocytes Relative: 4 %
Myelocytes: 0 %
Neutro Abs: 9.7 10*3/uL — ABNORMAL HIGH (ref 1.7–7.7)
Neutrophils Relative %: 69 %
Other: 0 %
Platelets: 168 10*3/uL (ref 150–400)
Promyelocytes Relative: 0 %
RBC: 3.53 MIL/uL — ABNORMAL LOW (ref 4.22–5.81)
RDW: 15.8 % — ABNORMAL HIGH (ref 11.5–15.5)
WBC: 11.1 10*3/uL — ABNORMAL HIGH (ref 4.0–10.5)
nRBC: 0 /100 WBC

## 2017-05-16 LAB — GLUCOSE, CAPILLARY
Glucose-Capillary: 107 mg/dL — ABNORMAL HIGH (ref 65–99)
Glucose-Capillary: 108 mg/dL — ABNORMAL HIGH (ref 65–99)
Glucose-Capillary: 121 mg/dL — ABNORMAL HIGH (ref 65–99)
Glucose-Capillary: 124 mg/dL — ABNORMAL HIGH (ref 65–99)
Glucose-Capillary: 126 mg/dL — ABNORMAL HIGH (ref 65–99)
Glucose-Capillary: 130 mg/dL — ABNORMAL HIGH (ref 65–99)
Glucose-Capillary: 132 mg/dL — ABNORMAL HIGH (ref 65–99)
Glucose-Capillary: 134 mg/dL — ABNORMAL HIGH (ref 65–99)
Glucose-Capillary: 144 mg/dL — ABNORMAL HIGH (ref 65–99)
Glucose-Capillary: 148 mg/dL — ABNORMAL HIGH (ref 65–99)
Glucose-Capillary: 155 mg/dL — ABNORMAL HIGH (ref 65–99)
Glucose-Capillary: 168 mg/dL — ABNORMAL HIGH (ref 65–99)
Glucose-Capillary: 169 mg/dL — ABNORMAL HIGH (ref 65–99)
Glucose-Capillary: 171 mg/dL — ABNORMAL HIGH (ref 65–99)
Glucose-Capillary: 185 mg/dL — ABNORMAL HIGH (ref 65–99)
Glucose-Capillary: 91 mg/dL (ref 65–99)

## 2017-05-16 LAB — CREATININE, SERUM
Creatinine, Ser: 1.49 mg/dL — ABNORMAL HIGH (ref 0.61–1.24)
GFR calc Af Amer: >60 mL/min (ref >60)
GFR calc non Af Amer: 54 mL/min — ABNORMAL LOW (ref >60)

## 2017-05-16 LAB — COMPREHENSIVE METABOLIC PANEL
ALT: 12 U/L — ABNORMAL LOW (ref 17–63)
AST: 59 U/L — ABNORMAL HIGH (ref 15–41)
Albumin: 3.1 g/dL — ABNORMAL LOW (ref 3.5–5.0)
Alkaline Phosphatase: 48 U/L (ref 38–126)
Anion gap: 10 (ref 5–15)
BUN: 26 mg/dL — ABNORMAL HIGH (ref 6–20)
CO2: 24 mmol/L (ref 22–32)
Calcium: 8.6 mg/dL — ABNORMAL LOW (ref 8.9–10.3)
Chloride: 105 mmol/L (ref 101–111)
Creatinine, Ser: 1.27 mg/dL — ABNORMAL HIGH (ref 0.61–1.24)
GFR calc Af Amer: >60 mL/min (ref >60)
GFR calc non Af Amer: >60 mL/min (ref >60)
Glucose, Bld: 137 mg/dL — ABNORMAL HIGH (ref 65–99)
Potassium: 4.9 mmol/L (ref 3.5–5.1)
Sodium: 139 mmol/L (ref 135–145)
Total Bilirubin: 3.2 mg/dL — ABNORMAL HIGH (ref 0.3–1.2)
Total Protein: 6.4 g/dL — ABNORMAL LOW (ref 6.5–8.1)

## 2017-05-16 LAB — POCT I-STAT, CHEM 8
BUN: 27 mg/dL — ABNORMAL HIGH (ref 6–20)
BUN: 24 mg/dL — ABNORMAL HIGH (ref 6–20)
Calcium, Ion: 1.17 mmol/L (ref 1.15–1.40)
Calcium, Ion: 1.19 mmol/L (ref 1.15–1.40)
Chloride: 106 mmol/L (ref 101–111)
Chloride: 104 mmol/L (ref 101–111)
Creatinine, Ser: 1.1 mg/dL (ref 0.61–1.24)
Creatinine, Ser: 1.3 mg/dL — ABNORMAL HIGH (ref 0.61–1.24)
Glucose, Bld: 110 mg/dL — ABNORMAL HIGH (ref 65–99)
Glucose, Bld: 185 mg/dL — ABNORMAL HIGH (ref 65–99)
HCT: 26 % — ABNORMAL LOW (ref 39.0–52.0)
HCT: 31 % — ABNORMAL LOW (ref 39.0–52.0)
Hemoglobin: 8.8 g/dL — ABNORMAL LOW (ref 13.0–17.0)
Hemoglobin: 10.5 g/dL — ABNORMAL LOW (ref 13.0–17.0)
Potassium: 4.8 mmol/L (ref 3.5–5.1)
Potassium: 4.4 mmol/L (ref 3.5–5.1)
Sodium: 140 mmol/L (ref 135–145)
Sodium: 136 mmol/L (ref 135–145)
TCO2: 24 mmol/L (ref 22–32)
TCO2: 23 mmol/L (ref 22–32)

## 2017-05-16 LAB — COOXEMETRY PANEL
Carboxyhemoglobin: 1.2 % (ref 0.5–1.5)
Carboxyhemoglobin: 1.6 % — ABNORMAL HIGH (ref 0.5–1.5)
Methemoglobin: 1.6 % — ABNORMAL HIGH (ref 0.0–1.5)
Methemoglobin: 1.1 % (ref 0.0–1.5)
O2 Saturation: 66.6 %
O2 Saturation: 77.7 %
Total hemoglobin: 12 g/dL (ref 12.0–16.0)
Total hemoglobin: 10.5 g/dL — ABNORMAL LOW (ref 12.0–16.0)

## 2017-05-16 LAB — PROTIME-INR
INR: 1.24
Prothrombin Time: 15.5 seconds — ABNORMAL HIGH (ref 11.4–15.2)

## 2017-05-16 LAB — PREPARE PLATELET PHERESIS: Unit division: 0

## 2017-05-16 LAB — BPAM PLATELET PHERESIS
Blood Product Expiration Date: 201905022359
ISSUE DATE / TIME: 201904301133
Unit Type and Rh: 6200

## 2017-05-16 LAB — MAGNESIUM
Magnesium: 2.5 mg/dL — ABNORMAL HIGH (ref 1.7–2.4)
Magnesium: 2.1 mg/dL (ref 1.7–2.4)

## 2017-05-16 LAB — PHOSPHORUS: Phosphorus: 3.7 mg/dL (ref 2.5–4.6)

## 2017-05-16 LAB — LACTATE DEHYDROGENASE: LDH: 253 U/L — ABNORMAL HIGH (ref 98–192)

## 2017-05-16 LAB — PREPARE RBC (CROSSMATCH)

## 2017-05-16 LAB — BRAIN NATRIURETIC PEPTIDE: B Natriuretic Peptide: 262.3 pg/mL — ABNORMAL HIGH (ref 0.0–100.0)

## 2017-05-16 LAB — CALCIUM, IONIZED: Calcium, Ionized, Serum: 4.7 mg/dL (ref 4.5–5.6)

## 2017-05-16 MED ORDER — SODIUM CHLORIDE 0.9% FLUSH: 10.0000 mL | INTRAVENOUS | Status: DC | PRN

## 2017-05-16 MED ORDER — WARFARIN - PHYSICIAN DOSING INPATIENT
Freq: Every day | Status: DC
  Administered 2017-05-17 – 2017-05-18 (×2)
  Administered 2017-05-19: 1

## 2017-05-16 MED ORDER — INSULIN ASPART 100 UNIT/ML ~~LOC~~ SOLN
0.0000 [IU] | SUBCUTANEOUS | Status: DC
  Administered 2017-05-16: 4 [IU] via SUBCUTANEOUS
  Administered 2017-05-16: 2 [IU] via SUBCUTANEOUS
  Administered 2017-05-16 (×2): 4 [IU] via SUBCUTANEOUS
  Administered 2017-05-17 (×3): 2 [IU] via SUBCUTANEOUS

## 2017-05-16 MED ORDER — INSULIN DETEMIR 100 UNIT/ML ~~LOC~~ SOLN
18.0000 [IU] | Freq: Two times a day (BID) | SUBCUTANEOUS | Status: DC
  Administered 2017-05-16 – 2017-05-17 (×3): 18 [IU] via SUBCUTANEOUS
  Filled 2017-05-16 (×6): qty 0.18

## 2017-05-16 MED ORDER — FUROSEMIDE 10 MG/ML IJ SOLN
40.0000 mg | Freq: Once | INTRAMUSCULAR | Status: AC
  Administered 2017-05-16: 40 mg via INTRAVENOUS

## 2017-05-16 MED ORDER — DOCUSATE SODIUM 50 MG/5ML PO LIQD
200.0000 mg | Freq: Every day | ORAL | Status: DC
  Administered 2017-05-16: 200 mg
  Filled 2017-05-16: qty 20

## 2017-05-16 MED ORDER — AMIODARONE HCL IN DEXTROSE 360-4.14 MG/200ML-% IV SOLN
INTRAVENOUS | Status: AC
  Administered 2017-05-16: 30 mg/h via INTRAVENOUS
  Filled 2017-05-16: qty 200

## 2017-05-16 MED ORDER — INSULIN DETEMIR 100 UNIT/ML ~~LOC~~ SOLN
14.0000 [IU] | Freq: Two times a day (BID) | SUBCUTANEOUS | Status: DC
  Administered 2017-05-16: 14 [IU] via SUBCUTANEOUS
  Filled 2017-05-16 (×2): qty 0.14

## 2017-05-16 MED ORDER — ORAL CARE MOUTH RINSE
15.0000 mL | Freq: Two times a day (BID) | OROMUCOSAL | Status: DC
  Administered 2017-05-16 – 2017-05-21 (×9): 15 mL via OROMUCOSAL

## 2017-05-16 MED ORDER — CHLORHEXIDINE GLUCONATE CLOTH 2 % EX PADS
6.0000 | MEDICATED_PAD | Freq: Every day | CUTANEOUS | Status: DC
  Administered 2017-05-16: 6 via TOPICAL

## 2017-05-16 MED ORDER — FUROSEMIDE 10 MG/ML IJ SOLN
20.0000 mg | Freq: Once | INTRAMUSCULAR | Status: AC
  Administered 2017-05-16: 20 mg via INTRAVENOUS

## 2017-05-16 MED ORDER — CHLORHEXIDINE GLUCONATE 0.12 % MT SOLN
15.0000 mL | Freq: Two times a day (BID) | OROMUCOSAL | Status: DC
  Administered 2017-05-16 – 2017-05-21 (×10): 15 mL via OROMUCOSAL
  Filled 2017-05-16 (×9): qty 15

## 2017-05-16 MED ORDER — WARFARIN SODIUM 2 MG PO TABS
4.0000 mg | ORAL_TABLET | Freq: Once | ORAL | Status: AC
  Administered 2017-05-16: 4 mg via ORAL
  Filled 2017-05-16: qty 2

## 2017-05-16 MED ORDER — SODIUM CHLORIDE 0.9 % IV SOLN: Freq: Once | INTRAVENOUS | Status: AC

## 2017-05-16 MED ORDER — AMIODARONE HCL IN DEXTROSE 360-4.14 MG/200ML-% IV SOLN
30.0000 mg/h | INTRAVENOUS | Status: DC
  Administered 2017-05-16 – 2017-05-17 (×3): 30 mg/h via INTRAVENOUS
  Filled 2017-05-16 (×2): qty 200

## 2017-05-16 MED ORDER — FUROSEMIDE 10 MG/ML IJ SOLN
8.0000 mg/h | INTRAVENOUS | Status: AC
  Administered 2017-05-16: 6 mg/h via INTRAVENOUS
  Administered 2017-05-17: 8 mg/h via INTRAVENOUS
  Filled 2017-05-16: qty 21
  Filled 2017-05-16: qty 25

## 2017-05-16 MED ORDER — SODIUM CHLORIDE 0.9% FLUSH
10.0000 mL | Freq: Two times a day (BID) | INTRAVENOUS | Status: DC
  Administered 2017-05-16 – 2017-06-01 (×21): 10 mL

## 2017-05-16 MED ORDER — FUROSEMIDE 10 MG/ML IJ SOLN
10.0000 mg/h | INTRAVENOUS | Status: DC
  Filled 2017-05-16: qty 25

## 2017-05-16 MED FILL — Magnesium Sulfate Inj 50%: INTRAMUSCULAR | Qty: 10 | Status: AC

## 2017-05-16 MED FILL — Electrolyte-R (PH 7.4) Solution: INTRAVENOUS | Qty: 3000 | Status: AC

## 2017-05-16 MED FILL — Sodium Chloride IV Soln 0.9%: INTRAVENOUS | Qty: 2000 | Status: AC

## 2017-05-16 MED FILL — Heparin Sodium (Porcine) Inj 1000 Unit/ML: INTRAMUSCULAR | Qty: 30 | Status: AC

## 2017-05-16 MED FILL — Potassium Chloride Inj 2 mEq/ML: INTRAVENOUS | Qty: 40 | Status: AC

## 2017-05-16 MED FILL — Mannitol IV Soln 20%: INTRAVENOUS | Qty: 500 | Status: AC

## 2017-05-16 MED FILL — Heparin Sodium (Porcine) Inj 1000 Unit/ML: INTRAMUSCULAR | Qty: 60 | Status: AC

## 2017-05-16 MED FILL — Sodium Bicarbonate IV Soln 8.4%: INTRAVENOUS | Qty: 50 | Status: AC

## 2017-05-16 MED FILL — Heparin Sodium (Porcine) Inj 1000 Unit/ML: INTRAMUSCULAR | Qty: 10 | Status: AC

## 2017-05-16 NOTE — Op Note (Signed)
NAME:  Benjamin Sherman, Benjamin Sherman                    ACCOUNT NO.:  MEDICAL RECORD NO.:  [id number]  LOCATION:                                 FACILITY:  PHYSICIAN:  Kerin Perna, M.D.  DATE OF BIRTH:  [birth year]  DATE OF PROCEDURE:  05/15/2017 DATE OF DISCHARGE:                              OPERATIVE REPORT   OPERATION:  Implantation of HeartMate 3 left ventricular assist device.             Resection of 3 cm LV apical thrombus, mobile on a pedicle SURGEON:  Kerin Perna, M.D.  CO-SURGEON:  Evelene Croon, M.D.  ANESTHESIA:  General by Dr. Kipp Brood.  PREOPERATIVE DIAGNOSIS:  Nonischemic cardiomyopathy with acute systolic heart failure and cardiogenic shock.  POSTOPERATIVE DIAGNOSIS:  Nonischemic cardiomyopathy with acute systolic heart failure and cardiogenic shock.  CLINICAL NOTE:  The patient is a 47 year old, African American, nondiabetic male who presented to the hospital with fatigue, shortness of breath, hypotension, and cardiogenic shock.  He was admitted by the Advanced Heart Failure Service and placed on inotropic support including milrinone.  That did not significantly improve his cardiac output and a balloon pump was placed.  His end-organ function improved with balloon pump and inotropic support as did his symptoms.  The balloon pump was weaned and removed.  He underwent right heart catheterization, which showed moderately elevated right-sided pressures, borderline cardiac output, and moderate RV dysfunction.  Echocardiogram showed no significant valvular disease or pericardial effusion.  He is evaluated for destination therapy, left ventricular assist device implantation. His evaluation included dental evaluation, which showed multiple necrotic teeth and he underwent evaluation and dental extraction.  His preoperative evaluation also documented a mobile thrombus in the left ventricular apex and he remained anticoagulated.  After the patient's evaluation and  recommendation by the mechanical support team for destination therapy, ventricular assist device therapy, I examined the patient, reviewed his right and left heart catheterization data, echocardiogram, and CT scan of chest and discussed the procedure of left ventricular assist device implantation.  We discussed the use of general anesthesia, the location of the surgical incisions, and the use of cardiopulmonary bypass.  He understood that this pump would augment the function of his own heart and his heart would not be moved or altered.  He understood the reasons for the assist device implantation were to improve his survival and improve his quality of life.  He understood the risks of the ventricular assist device implantation including stroke, bleeding, blood transfusion, postoperative infection, postoperative pulmonary problems including pleural effusion, postoperative arrhythmias, postoperative organ failure, and death.  After reviewing these issues, he demonstrated understanding and agreed to proceed with surgery under what I felt was an informed consent.  OPERATIVE FINDINGS: 1. Large mobile thrombus was removed from the LV apex. 2. RV dysfunction was mild to moderate and adequate for good     hemodynamic performance of the left ventricular assist device.  DESCRIPTION OF PROCEDURE:  The patient was brought directly from preop holding to the operating room where he was placed supine on the operating table and general anesthesia was induced.  He was on balloon pump preoperatively.  A new Swan catheter was placed and the previous central lines were removed.  Transesophageal echo probe was placed by the Anesthesia team.  This showed no evidence of PFO.  There was no significant TR or AI.  RV function was mild to moderately reduced.  LVEF was 20-25%.  The patient was prepped and draped as a sterile field.  A proper time- out was performed.  A sternal incision was made.  Also,  a counterincision in the left mid abdomen and under the left costal margin was created for the driveline tunnel.  The sternum was divided and the pericardium was opened and suspended. Pursestrings were placed in the ascending aorta and right atrium, and heparin was administered.  When the ACT was documented as being therapeutic, the patient was cannulated and placed on cardiopulmonary bypass.  The patient remained warm and with empty beating heart on cardiopulmonary bypass.  CO2 was insufflated into the operative field.  The LV apex was elevated on several lap pads.  An incision was made in the LV apex, dilated and a Foley catheter with balloon was inserted in the LV and the cutting device was used to remove a plug of LV apical muscle.  The inside of the left ventricle was examined.  There was a mobile thrombus on a stalk, which was completely removed and sent to Pathology. Excess caudal trabeculae at the apex were removed to allow unobstructed insertion of the apical cannula.  Next, 2-0 Ethibond sutures were placed around the opening in the LV apex with large premeasured pledgets.  Next, the sewing ring was placed around the opening in the LV apex and the sutures were all tied.  Next, wall volume was left into the heart and the patient was placed in Trendelenburg position.  The HeartMate 3 pump was brought to the field and the inflow cannula was inserted into the sewing ring and the device was locked in place.  The pump was then lowered into the pericardium and into the pocket which had been made prior to heparinization.  The driveline was then tunneled out through the 2 incisions.  The outflow graft was directed lateral to the venous cannula and divided at the appropriate length and angle and a vascular clamp was placed.  Next, a partial occluding clamp was placed on the ascending aorta and an aortotomy was performed.  Next, the graft was sewn end-to-side to the ascending aorta  with running 4-0 Prolene.  Air was vented from the graft prior to releasing the partial clamp.  The partial clamp was removed with the clamp still in place on the outflow graft.  There was good hemostasis at the suture line.  Next, temporary pacing wires were applied and the lungs were expanded. The ventilator was resumed.  Volume was left into the pump as pump speed was progressively increased from 4000 RPMs up to 5000 RPMs and the outflow vascular clamp was removed on the graft.  We transitioned completely off cardiopulmonary bypass on 5200 RPMs.  The venous cannula was removed.  There was still significant aortic valve opening and native LV ejection and the pump speed was increased to 5400 RPMs. Protamine was administered.  There was still coagulopathy and the patient was given additional FFP and platelets.  Left pleural, anterior mediastinal, and pocket drains were placed and were brought out through separate incisions.  Pump flows were over 4 L/min.  Extra volume was given through the aortic cannula and the pump speed was increased to 5500 RPMs as  the final adjustment.  The aortic cannula was removed. Superior pericardial fat was closed over the aorta.  The sternum was closed with a wire.  The pectoralis fascia was closed with a running #1 Vicryl.  The subcutaneous and skin layers were closed using running Vicryl.  The counterincision was closed in layers using Vicryl and the power cord exit site was closed with a nylon skin stitch. Sterile dressings were placed.  The patient was transported to the ICU on inhaled nitric oxide in stable condition.     Kerin Perna, M.D.     PV/MEDQ  D:  05/15/2017  T:  05/16/2017  Job:  [id number]

## 2017-05-16 NOTE — Progress Notes (Addendum)
Patient ID: Benjamin Sherman, male   DOB: 04-04-1970, 47 y.o.   MRN: [id number]     Advanced Heart Failure Rounding Note  Subjective:    Events:  -Admitted 04/21/17 with cardiogenic shock and atrial flutter.  -Central line placed. Initial co-ox 39%. -IABP placed 04/25/17-> 04/30/17 with persistent cardiogenic shock.  -DCCV atrial flutter 4/12.   - S/p multiple teeth extractions 05/11/17.  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   Remains on epi 3 mcg, milrinone 0.25 mcg, Norepi 8 mcg, and amio 30 mg per hour.   Over night speed turned down to 5400 due to >100 PI events and ectopy. Started on amio drip. No PI events with lower speed.   Awake on vent. Following commands.   Swan #s CVP 10 PAP 29/15 CO 4.8 CI 2.3   HMIII Pump Speed 5400, Flow 4.6, PI .1.9, and Power 3.9.    Objective:   Weight Range:  Vital Signs:   Temp:  [96.1 F (35.6 C)-101.1 F (38.4 C)] 100 F (37.8 C) (05/01 0800) Pulse Rate:  [79-179] 88 (05/01 0813) Resp:  [7-25] 19 (05/01 0800) SpO2:  [89 %-100 %] 100 % (05/01 0813) FiO2 (%):  [40 %-50 %] 40 % (05/01 0818) Weight:  [211 lb 3.2 oz (95.8 kg)] 211 lb 3.2 oz (95.8 kg) (05/01 0500) Last BM Date: 05/13/17  Weight change: Filed Weights   05/14/17 0425 05/15/17 0500 05/16/17 0500  Weight: 190 lb 14.4 oz (86.6 kg) 195 lb 5.2 oz (88.6 kg) 211 lb 3.2 oz (95.8 kg)   Intake/Output:   Intake/Output Summary (Last 24 hours) at 05/16/2017 0844 Last data filed at 05/16/2017 0800 Gross per 24 hour  Intake 8322.03 ml  Output 3540 ml  Net 4782.03 ml    Physical Exam  CVP 10  Physical Exam: GENERAL: Intubated awake.  HEENT: normal except ETT. LIJ swan  NECK: Supple, JVP ~10  .  2+ bilaterally, no bruits.  No lymphadenopathy or thyromegaly appreciated.   CARDIAC:  Mechanical heart sounds with LVAD hum present.  LUNGS:  Clear to auscultation bilaterally.  ABDOMEN:  Soft, round, nontender, positive bowel sounds x4.     LVAD exit site:   Dressing dry and intact.  No erythema or  drainage.  Stabilization device present.  Driveline dressing is being changed daily per sterile technique. EXTREMITIES:  Warm and dry, no cyanosis, clubbing, rash or edema . R and LLE SCDs.  NEUROLOGIC:  Intubated follows commands.        Telemetry    NSR 80-90s   EKG   No new tracings.   Labs    Basic Metabolic Panel: Recent Labs  Lab 05/13/17 0436 05/14/17 0227 05/15/17 0400  05/15/17 1552 05/15/17 1830 05/15/17 2028 05/15/17 2130 05/16/17 0150 05/16/17 0529  NA 135 134* 134*   < > 138 140 139  --  140 139  K 4.0 4.3 4.1   < > 4.0 4.4 4.6  --  4.8 4.9  CL 99* 95* 97*   < > 106 103 103  --  106 105  CO2 '29 29 26  ' --  26  --   --   --   --  24  GLUCOSE 105* 119* 163*   < > 124* 142* 186*  --  110* 137*  BUN 47* 49* 38*   < > 30* 27* 27*  --  27* 26*  CREATININE 1.39* 1.47* 1.35*   < > 1.36* 1.26*  1.20 1.10 1.24 1.10 1.27*  CALCIUM 9.3 9.8 9.6  --  8.3*  --   --   --   --  8.6*  MG  --  1.8  --   --  2.2 2.8*  --  2.7*  --  2.5*  PHOS  --   --   --   --   --   --   --   --   --  3.7   < > = values in this interval not displayed.   Liver Function Tests: Recent Labs  Lab 05/11/17 0350 05/16/17 0529  AST 27 59*  ALT 18 12*  ALKPHOS 84 48  BILITOT 1.1 3.2*  PROT 9.2* 6.4*  ALBUMIN 3.0* 3.1*   No results for input(s): LIPASE, AMYLASE in the last 168 hours. No results for input(s): AMMONIA in the last 168 hours.  CBC: Recent Labs  Lab 05/15/17 1328  05/15/17 1552 05/15/17 1830 05/15/17 2028 05/15/17 2130 05/16/17 0136 05/16/17 0150 05/16/17 0529  WBC 9.8  --   --  8.8  --  7.7 8.4  --  11.1*  NEUTROABS  --   --   --   --   --   --   --   --  PENDING  HGB 8.9*   < >  --  7.7*  7.5* 8.2* 8.9* 8.9* 8.8* 9.7*  HCT 27.2*   < >  --  24.0*  22.0* 24.0* 27.7* 27.4* 26.0* 30.4*  MCV 85.3  --   --  85.7  --  85.8 85.4  --  86.1  PLT 188  --  208 165  --  148* 163  --  168   < > = values in this interval not displayed.   Cardiac Enzymes: No results for  input(s): CKTOTAL, CKMB, CKMBINDEX, TROPONINI in the last 168 hours. BNP: BNP (last 3 results) Recent Labs    03/25/17 1401 04/21/17 1212 05/16/17 0529  BNP 835.4* 483.0* 262.3*   ProBNP (last 3 results) No results for input(s): PROBNP in the last 8760 hours.  Other results:  Imaging: Dg Chest Port 1 View  Result Date: 05/15/2017 CLINICAL DATA:  Status post left ventricular assist device placement. EXAM: PORTABLE CHEST 1 VIEW COMPARISON:  Earlier today. FINDINGS: Interval LVAD and mediastinal and left chest tubes. No pneumothorax. Interval median sternotomy wires and upper mediastinal surgical clips. Left jugular Swan-Ganz catheter tip in the main pulmonary artery. Endotracheal tube tip 4 cm above the carina. Nasogastric tube extending through the esophagus with its tip Addison below the gastroesophageal junction and side hole in the distal esophagus. Mildly enlarged cardiac silhouette. Mild right basilar and left perihilar atelectasis. IMPRESSION: 1. Interval LVAD without pneumothorax. 2. Proximally positioned nasogastric tube. It is recommended that this be advanced into the stomach. 3. Mild right basilar and left perihilar atelectasis. Electronically Signed   By: Claudie Revering M.D.   On: 05/15/2017 14:18   Dg Chest Port 1 View  Result Date: 05/15/2017 CLINICAL DATA:  Pre LVAD.  Intra-aortic balloon pump. EXAM: PORTABLE CHEST 1 VIEW COMPARISON:  05/14/2017 FINDINGS: Swan-Ganz catheter remains in the central right pulmonary artery. Right PICC line tip is at the cavoatrial junction. Intra-aortic balloon pump projects over the proximal descending thoracic aorta, stable. Heart is borderline in size. Lungs clear. No effusions or pneumothorax. No acute bony abnormality. IMPRESSION: Support devices, including intra-aortic balloon pump are stable. Borderline heart size.  No active disease. Electronically Signed   By: Rolm Baptise M.D.   On: 05/15/2017 09:44   Dg Chest [facility]  Result Date:  05/14/2017 CLINICAL DATA:  Aortic balloon pump assistance. History of CHF and diabetes. EXAM: PORTABLE CHEST 1 VIEW COMPARISON:  Chest radiograph April 10, 2017 FINDINGS: Interval placement of Swan-Ganz catheter via RIGHT internal jugular venous approach with distal tip projecting in main pulmonary artery. Marker indicating aortic balloon assist device projects in proximal descending aorta. RIGHT PICC distal tip projects in distal superior vena cava, stable. Cardiomediastinal silhouette is unremarkable for this low inspiratory examination with crowded vasculature markings. The lungs are clear without pleural effusions or focal consolidations. Minimal RIGHT lung base atelectasis. Trachea projects midline and there is no pneumothorax. Included soft tissue planes and osseous structures are non-suspicious. IMPRESSION: Interval placement of Swan-Ganz catheter via RIGHT internal jugular venous approach with distal tip projecting in main pulmonary artery. Marker indicating aortic balloon assist device projects in proximal descending aorta. No pneumothorax. Minimal RIGHT lung base atelectasis. Electronically Signed   By: Elon Alas M.D.   On: 05/14/2017 13:33    Medications:    Scheduled Medications: . acetaminophen  1,000 mg Oral Q6H   Or  . acetaminophen (TYLENOL) oral liquid 160 mg/5 mL  1,000 mg Per Tube Q6H  . aspirin EC  325 mg Oral Daily   Or  . aspirin  324 mg Per Tube Daily   Or  . aspirin  300 mg Rectal Daily  . bisacodyl  10 mg Oral Daily   Or  . bisacodyl  10 mg Rectal Daily  . chlorhexidine gluconate (MEDLINE KIT)  15 mL Mouth Rinse BID  . Chlorhexidine Gluconate Cloth  6 each Topical Daily  . digoxin  0.125 mg Oral Daily  . docusate sodium  200 mg Oral Daily  . insulin regular  0-10 Units Intravenous TID WC  . mouth rinse  15 mL Mouth Rinse 10 times per day  . metoCLOPramide (REGLAN) injection  10 mg Intravenous Q6H  . nutrition supplement (JUVEN)  1 packet Oral BID BM  . [START  ON 05/17/2017] pantoprazole  40 mg Oral Daily  . rifampin  600 mg Oral Once  . sodium chloride flush  10-40 mL Intracatheter Q12H  . sodium chloride flush  3 mL Intravenous Q12H    Infusions: . sodium chloride 20 mL/hr at 05/16/17 0800  . sodium chloride    . sodium chloride 10 mL/hr at 05/16/17 0800  . sodium chloride    . sodium chloride    . amiodarone 30 mg/hr (05/16/17 0800)  . cefUROXime (ZINACEF)  IV Stopped (05/16/17 0432)  . dexmedetomidine (PRECEDEX) IV infusion 0.3 mcg/kg/hr (05/16/17 0809)  . EPINEPHrine 4 mg in dextrose 5% 250 mL infusion (16 mcg/mL) 3 mcg/min (05/16/17 0800)  . furosemide (LASIX) infusion    . insulin (NOVOLIN-R) infusion 5.1 Units/hr (05/16/17 0815)  . lactated ringers    . lactated ringers 20 mL/hr at 05/16/17 0800  . milrinone 0.25 mcg/kg/min (05/16/17 0800)  . norepinephrine (LEVOPHED) Adult infusion 8 mcg/min (05/16/17 0800)  . phenylephrine (NEO-SYNEPHRINE) Adult infusion Stopped (05/16/17 0530)  . vancomycin 1,000 mg (05/16/17 0837)    PRN Medications:    Assessment:   Ruffus Kamaka is a 47 y.o. 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 eosinophilic cardiomyopathy, LV thrombus, hypothyroidism, DM2, CKD Stage II-III and traumatic [facility] 11/18 after fall.   Admitted with cardiogenic shock.  Plan/Discussion:   1. S/P HMIII 4/30 under DT.  Speed cut back from 5500 to 5400 per Dr Darcey Nora.  CO-OX stable.  Remains on epi 41mg,  milrinone 0.25 mcg, and Norepi 8 mcg + NO at 4 PPM Plan for extubation later today.  On 325 mg aspirin. Anticoagulation per TCTS.   2. Acute on chronic systolic HF -> cardiogenic shock: ECHO 11/2016 EF 25-30% CMRI EF 22%. Bedside echo in HF clinic EF 20% in 3/19.  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 Did not respond to steroids. Admitted 4/6 with cardiogenic shock and volume overload co-ox 38%. Echo 04/23/17: LVEF 20-25% with large LV clot, RV mild to moderately down. CVP 10.  Starting lasix drip later today.   3. Atrial flutter: New onset. S/p DCCV to NSR on 4/12.  - Continue amio drip.    4. AKI CKD Stage II-III: Likely ATN due to shock - Creatinine 1.25 today   Improved with supprot  5. H/o LV Thrombus - now recurrent on echo 04/23/17:   6. DMII - Per TCTS.   7. Hypothyroidism: TFTs OK 04/22/17.  - TSH elevated at 10.848 4/23. T4 and T3 normal. No change  8.. RLE erythema/cellulitis - Started doxy 4/20. Resolved.  - Uric acid 11.5 on 4/19.  - Will need to restart allopurinol in a few days.    9.  Blood clot in mouth    Darrick Grinder, NP  05/16/2017 8:44 AM   Advanced Heart Failure Team Pager [phone] (M-F; [place])  Please contact [facility] for night-coverage after hours (4p -7a ) and weekends on [email or web address]  Patient seen and examined with Darrick Grinder, NP. We discussed all aspects of the encounter. I agree with the assessment and plan as stated above.     He is POD#1 VAD. Extubated this am. On epi 69mg, milrinone 0.25 mcg, and Norepi 8 mcg + NO at 4 PPM. MAP and pulmonary pressures stable. Will wean as tolerated. Volume status elevated. Agree with IV lasix. VAD interrogated personally. Parameters stable. Remains in NSR. Continue amio.   DGlori Bickers MD  7:23 PM

## 2017-05-16 NOTE — Plan of Care (Addendum)
  Problem: Cardiac: Goal: Ability to maintain an adequate cardiac output will improve Outcome: Progressing  CO/CI: 5.2/2.49   Problem: Fluid Volume: Goal: Risk for excess fluid volume will decrease Outcome: Progressing Patient diuresing 80-125 ml/hour of urine   Problem: Respiratory: Goal: Will regain and/or maintain adequate ventilation Outcome: Progressing  Patient extubated to 4L [place] & 4 ppm of NO2; maintaining O2 sats >95%; able to achieve 1000 ml on IS

## 2017-05-16 NOTE — Progress Notes (Signed)
Dr. Donata Clay made aware of Levophed being restarted due to low MAP, increasing ectopy, VAD numbers (PI fluctuating up to 7.5), urine output, lab results, and overall patient status. Orders received to give one additional PRBC, start Amiodarone at 30mg , increase Epi drip to . Will carry out all orders, and continue to closely monitor. Thresa Ross RN

## 2017-05-16 NOTE — Progress Notes (Signed)
VAD dressing changed using sterile technique; site cleansed with 2 ChloraPrep applicators, allowing adequate dry time between each application.  Aquacel chevron wrap applied around driveline, gauze occlusive dressing applied and 2 new driveline anchors secured.  No redness or drainage present.  Suture present. [name], Benjamin Sherman

## 2017-05-16 NOTE — Plan of Care (Signed)
3cc of factor 7 wasted in sink. Witnessed by Everlean Alstrom RN

## 2017-05-16 NOTE — Procedures (Signed)
Extubation Procedure Note  Patient Details:   Name: Benjamin Sherman DOB: 1970/12/15 MRN: [id number]   Airway Documentation:    Vent end date: 05/16/17 Vent end time: 1003   Evaluation  O2 sats: stable throughout Complications: No apparent complications Patient did tolerate procedure well. Bilateral Breath Sounds: Clear, Diminished   Yes   Pt extubated to 4L N/C.  NIF -20, VC 902 mL.  No stridor noted.  RN @ bedside.  [name] 05/16/2017, 10:06 AM

## 2017-05-16 NOTE — Progress Notes (Signed)
PT Cancellation Note  Patient Details Name: Benjamin Sherman MRN: [id number] DOB: Mar 09, 1970   Cancelled Treatment:    Reason Eval/Treat Not Completed: Patient not medically ready. PT eval received, chart reviewed. Noted PT orders specify to start POD #2. Will follow up tomorrow to attempt PT eval.    Marylynn Pearson 05/16/2017, 7:09 AM   Conni Slipper, PT, DPT Acute Rehabilitation Services Pager: [phone]

## 2017-05-16 NOTE — Progress Notes (Signed)
OT Cancellation Note  Patient Details Name: Lavelle Stoever MRN: [id number] DOB: November 03, 1970   Cancelled Treatment:    Reason Eval/Treat Not Completed: Other (comment) . OT eval received, chart reviewed. Noted OT orders specify to start POD #2. Will follow up tomorrow to attempt OT eval.    Evern Bio Armani Gawlik 05/16/2017, 8:28 AM  Sherryl Manges OTR/L [phone]

## 2017-05-16 NOTE — Progress Notes (Signed)
HeartMate 3 Rounding Note POD # 1  Subjective:    47 year old diabetic with nonischemic cardiomyopathy admitted in cardiogenic shock with acute on chronic heart failure, ejection fraction 10%, moderate RV dysfunction, atrial flutter treated with cardioversion to sinus rhythm and mobile thrombus of left ventricular apex.  Patient required inotropic support and balloon pump support prior to completing evaluation for LVAD implantation.  Patient had satisfactory RV function at implantation and required low-dose inotropes.  Pump flow greater than 4 L/min with CVP 10-14.  Sternal closure successful. Preoperative balloon pump removed 6 hours postop.  Patient had significant bleeding from gums at site of previous dental extractions requiring factor replacement, blood transfusion of packed cells, and topical thrombostatic agent on gum line ;[ MRDH].  Now stopped bleeding.  Patient now responds to command and in process of being weaned off ventilator.   LVAD speed decreased to 5400 RPM with several PI events preceding change in speed.  LVAD INTERROGATION:  HeartMate 3LVAD:  Flow 4.5  liters/min, speed 5400, power 4.0, PI 2.0.  Controller intact  Objective:    Vital Signs:   Temp:  [96.1 F (35.6 C)-101.1 F (38.4 C)] 100 F (37.8 C) (05/01 0800) Pulse Rate:  [79-179] 88 (05/01 0813) Resp:  [7-25] 19 (05/01 0800) SpO2:  [89 %-100 %] 100 % (05/01 0813) FiO2 (%):  [40 %-50 %] 40 % (05/01 0847) Weight:  [211 lb 3.2 oz (95.8 kg)] 211 lb 3.2 oz (95.8 kg) (05/01 0500) Last BM Date: 05/13/17 Mean arterial Pressure 75-80  Intake/Output:   Intake/Output Summary (Last 24 hours) at 05/16/2017 0853 Last data filed at 05/16/2017 0800 Gross per 24 hour  Intake 8322.03 ml  Output 3540 ml  Net 4782.03 ml     Physical Exam: General:  Well appearing.  Responds on ventilator HEENT: normal currently no bleeding from mouth Neck: supple. JVP . Carotids 2+ bilat; no bruits. No lymphadenopathy or thryomegaly  appreciated. Cor: Mechanical heart sounds with LVAD hum present. Lungs: clear Abdomen: soft, nontender, nondistended. No hepatosplenomegaly. No bruits or masses. Good bowel sounds. Extremities: no cyanosis, clubbing, rash, edema Neuro: alert & orientedx3, cranial nerves grossly intact. moves all 4 extremities w/o difficulty. Affect pleasant  Telemetry: Sinus rhythm 94  Labs: Basic Metabolic Panel: Recent Labs  Lab 05/13/17 0436 05/14/17 0227 05/15/17 0400  05/15/17 1552 05/15/17 1830 05/15/17 2028 05/15/17 2130 05/16/17 0150 05/16/17 0529  NA 135 134* 134*   < > 138 140 139  --  140 139  K 4.0 4.3 4.1   < > 4.0 4.4 4.6  --  4.8 4.9  CL 99* 95* 97*   < > 106 103 103  --  106 105  CO2 '29 29 26  ' --  26  --   --   --   --  24  GLUCOSE 105* 119* 163*   < > 124* 142* 186*  --  110* 137*  BUN 47* 49* 38*   < > 30* 27* 27*  --  27* 26*  CREATININE 1.39* 1.47* 1.35*   < > 1.36* 1.26*  1.20 1.10 1.24 1.10 1.27*  CALCIUM 9.3 9.8 9.6  --  8.3*  --   --   --   --  8.6*  MG  --  1.8  --   --  2.2 2.8*  --  2.7*  --  2.5*  PHOS  --   --   --   --   --   --   --   --   --  3.7   < > = values in this interval not displayed.    Liver Function Tests: Recent Labs  Lab 05/11/17 0350 05/16/17 0529  AST 27 59*  ALT 18 12*  ALKPHOS 84 48  BILITOT 1.1 3.2*  PROT 9.2* 6.4*  ALBUMIN 3.0* 3.1*   No results for input(s): LIPASE, AMYLASE in the last 168 hours. No results for input(s): AMMONIA in the last 168 hours.  CBC: Recent Labs  Lab 05/15/17 1328  05/15/17 1552 05/15/17 1830 05/15/17 2028 05/15/17 2130 05/16/17 0136 05/16/17 0150 05/16/17 0529  WBC 9.8  --   --  8.8  --  7.7 8.4  --  11.1*  NEUTROABS  --   --   --   --   --   --   --   --  PENDING  HGB 8.9*   < >  --  7.7*  7.5* 8.2* 8.9* 8.9* 8.8* 9.7*  HCT 27.2*   < >  --  24.0*  22.0* 24.0* 27.7* 27.4* 26.0* 30.4*  MCV 85.3  --   --  85.7  --  85.8 85.4  --  86.1  PLT 188  --  208 165  --  148* 163  --  168   < > =  values in this interval not displayed.    INR: Recent Labs  Lab 05/11/17 0350 05/15/17 1328 05/15/17 1552 05/15/17 2130 05/16/17 0529  INR 1.16 1.33 1.39 0.99 1.24    Other results:  EKG:   Imaging: Dg Chest Port 1 View  Result Date: 05/15/2017 CLINICAL DATA:  Status post left ventricular assist device placement. EXAM: PORTABLE CHEST 1 VIEW COMPARISON:  Earlier today. FINDINGS: Interval LVAD and mediastinal and left chest tubes. No pneumothorax. Interval median sternotomy wires and upper mediastinal surgical clips. Left jugular Swan-Ganz catheter tip in the main pulmonary artery. Endotracheal tube tip 4 cm above the carina. Nasogastric tube extending through the esophagus with its tip Addison below the gastroesophageal junction and side hole in the distal esophagus. Mildly enlarged cardiac silhouette. Mild right basilar and left perihilar atelectasis. IMPRESSION: 1. Interval LVAD without pneumothorax. 2. Proximally positioned nasogastric tube. It is recommended that this be advanced into the stomach. 3. Mild right basilar and left perihilar atelectasis. Electronically Signed   By: Claudie Revering M.D.   On: 05/15/2017 14:18   Dg Chest Port 1 View  Result Date: 05/15/2017 CLINICAL DATA:  Pre LVAD.  Intra-aortic balloon pump. EXAM: PORTABLE CHEST 1 VIEW COMPARISON:  05/14/2017 FINDINGS: Swan-Ganz catheter remains in the central right pulmonary artery. Right PICC line tip is at the cavoatrial junction. Intra-aortic balloon pump projects over the proximal descending thoracic aorta, stable. Heart is borderline in size. Lungs clear. No effusions or pneumothorax. No acute bony abnormality. IMPRESSION: Support devices, including intra-aortic balloon pump are stable. Borderline heart size.  No active disease. Electronically Signed   By: Rolm Baptise M.D.   On: 05/15/2017 09:44   Dg Chest Port 1 View  Result Date: 05/14/2017 CLINICAL DATA:  Aortic balloon pump assistance. History of CHF and diabetes.  EXAM: PORTABLE CHEST 1 VIEW COMPARISON:  Chest radiograph April 10, 2017 FINDINGS: Interval placement of Swan-Ganz catheter via RIGHT internal jugular venous approach with distal tip projecting in main pulmonary artery. Marker indicating aortic balloon assist device projects in proximal descending aorta. RIGHT PICC distal tip projects in distal superior vena cava, stable. Cardiomediastinal silhouette is unremarkable for this low inspiratory examination with crowded vasculature markings. The lungs are clear  without pleural effusions or focal consolidations. Minimal RIGHT lung base atelectasis. Trachea projects midline and there is no pneumothorax. Included soft tissue planes and osseous structures are non-suspicious. IMPRESSION: Interval placement of Swan-Ganz catheter via RIGHT internal jugular venous approach with distal tip projecting in main pulmonary artery. Marker indicating aortic balloon assist device projects in proximal descending aorta. No pneumothorax. Minimal RIGHT lung base atelectasis. Electronically Signed   By: Elon Alas M.D.   On: 05/14/2017 13:33      Medications:     Scheduled Medications: . acetaminophen  1,000 mg Oral Q6H   Or  . acetaminophen (TYLENOL) oral liquid 160 mg/5 mL  1,000 mg Per Tube Q6H  . aspirin EC  325 mg Oral Daily   Or  . aspirin  324 mg Per Tube Daily   Or  . aspirin  300 mg Rectal Daily  . bisacodyl  10 mg Oral Daily   Or  . bisacodyl  10 mg Rectal Daily  . chlorhexidine gluconate (MEDLINE KIT)  15 mL Mouth Rinse BID  . Chlorhexidine Gluconate Cloth  6 each Topical Daily  . digoxin  0.125 mg Oral Daily  . docusate sodium  200 mg Oral Daily  . insulin aspart  0-24 Units Subcutaneous Q4H  . insulin detemir  14 Units Subcutaneous BID  . mouth rinse  15 mL Mouth Rinse 10 times per day  . metoCLOPramide (REGLAN) injection  10 mg Intravenous Q6H  . nutrition supplement (JUVEN)  1 packet Oral BID BM  . [START ON 05/17/2017] pantoprazole  40 mg Oral  Daily  . sodium chloride flush  10-40 mL Intracatheter Q12H  . sodium chloride flush  3 mL Intravenous Q12H     Infusions: . sodium chloride 20 mL/hr at 05/16/17 0800  . sodium chloride    . sodium chloride 10 mL/hr at 05/16/17 0800  . sodium chloride    . sodium chloride    . amiodarone 30 mg/hr (05/16/17 0800)  . cefUROXime (ZINACEF)  IV Stopped (05/16/17 0432)  . EPINEPHrine 4 mg in dextrose 5% 250 mL infusion (16 mcg/mL) 3 mcg/min (05/16/17 0800)  . furosemide (LASIX) infusion    . insulin (NOVOLIN-R) infusion 5.1 Units/hr (05/16/17 0815)  . lactated ringers    . lactated ringers 20 mL/hr at 05/16/17 0800  . milrinone 0.25 mcg/kg/min (05/16/17 0800)  . norepinephrine (LEVOPHED) Adult infusion 8 mcg/min (05/16/17 0800)  . vancomycin 1,000 mg (05/16/17 0837)     PRN Medications:  sodium chloride, midazolam, morphine injection, ondansetron (ZOFRAN) IV, oxyCODONE, sodium chloride flush, sodium chloride flush, traMADol   Assessment:  47 year old with nonischemic cardiomyopathy EF 10% admitted with cardiogenic shock and acute on chronic systolic heart failure  HeartMate 3 implanted April 30, preop balloon pump removed  Large LV thrombus removed intact intraoperatively Postoperative bleeding from gumline at prior dental extraction site controlled with topical hemostatic agent and product administration   Plan/Discussion:    Patient doing well now at pump speed of 5400 RPM We will proceed with vent wean and extubation Start low-dose Lasix drip for fluid overload Current inotropic support includes 3 mcg of epinephrine for RV function and 5-10 mcg of norepinephrine to maintain adequate M AP 70-85-85 mmHg  I reviewed the LVAD parameters from today, and compared the results to the patient's prior recorded data.  No programming changes were made.  The LVAD is functioning within specified parameters.  The patient performs LVAD self-test daily.  LVAD interrogation was negative for  any significant power  changes, alarms or PI events/speed drops.  LVAD equipment check completed and is in good working order.  Back-up equipment present.   LVAD education done on emergency procedures and precautions and reviewed exit site care.  Length of Stay: [place] III 05/16/2017, 8:53 AM

## 2017-05-16 NOTE — Progress Notes (Signed)
LVAD Coordinator Rounding Note:  Admitted 04/21/17 with cardiogenic shock and atrial flutter.   HM III LVAD implanted on 05/15/17 by Dr. Maren Beach  under Destination Therapy criteria due to limited social support.   Vital signs: Temp:  97.9 HR: 89 A line MAP: 73 Doppler:  74 O2 Sat: 100% on ventilator Wt: 195>211 lbs   LVAD interrogation reveals:  Speed: 5400 Flow: 4.6 Power:  3.9w PI: 1.9 Alarms: none Events: >100; none since pump speed reduced to 5400 Hematocrit: 30 Fixed speed: 5400 Low speed limit: 5100  Drive Line: left abdominal drive line gauze dressing dry and intact; anchor intact and accurately applied.   Labs:  LDH trend: 253  INR trend: 1.24  Anticoagulation Plan: -INR Goal: 2.0 - 2.5  -ASA Dose:  325 mg daily (until INR therapeutic)  Blood Products:   Intra op: 05/15/17 - 1 unit plts - 1 unit FFP  Post op:  - 05/15/17>1 RBC, 2 FFP, DDAVP, Factor VII -05/16/17>1 PC  Device: N/A  Arrythmias: increased ectopy, short run Afib 05/15/17 - Amiodarone started 30 mg/hr  Respiratory: extubated 05/16/17  Nitric Oxide: 4 ppm  Gtts: Milrinone> .25 mcg/kg/min Levo>8 mcg/min Epi>3 mcg/min Amiodarone>30 mg/hr   Adverse Events on VAD: -none  VAD Education: extubated today; pt remains drowsy, not medically ready for teaching.   Plan/Recommendations:   1. Daily drive line dressing changes per VAD Coordinator or Nurse Alla Feeling. [place], Nurse Alla Feeling to change dressing today. 2. Call VAD pager if any questions re: VAD equipment or drive line site/care.  [name] RN, VAD Coordinator 24/7 VAD pager: [phone]

## 2017-05-17 ENCOUNTER — Inpatient Hospital Stay (HOSPITAL_COMMUNITY): Payer: Medicaid Other

## 2017-05-17 ENCOUNTER — Inpatient Hospital Stay: Payer: Self-pay

## 2017-05-17 DIAGNOSIS — Z95811 Presence of heart assist device: Secondary | ICD-10-CM

## 2017-05-17 DIAGNOSIS — I509 Heart failure, unspecified: Secondary | ICD-10-CM

## 2017-05-17 LAB — COOXEMETRY PANEL
Carboxyhemoglobin: 1.3 % (ref 0.5–1.5)
Carboxyhemoglobin: 1.3 % (ref 0.5–1.5)
Carboxyhemoglobin: 1 % (ref 0.5–1.5)
Methemoglobin: 0.8 % (ref 0.0–1.5)
Methemoglobin: 0.9 % (ref 0.0–1.5)
Methemoglobin: 1.3 % (ref 0.0–1.5)
O2 Saturation: 57.7 %
O2 Saturation: 60.4 %
O2 Saturation: 62 %
Total hemoglobin: 14.7 g/dL (ref 12.0–16.0)
Total hemoglobin: 11.2 g/dL — ABNORMAL LOW (ref 12.0–16.0)
Total hemoglobin: 13.2 g/dL (ref 12.0–16.0)

## 2017-05-17 LAB — CBC WITH DIFFERENTIAL/PLATELET
Basophils Absolute: 0 10*3/uL (ref 0.0–0.1)
Basophils Relative: 0 %
Eosinophils Absolute: 0 10*3/uL (ref 0.0–0.7)
Eosinophils Relative: 0 %
HCT: 29.6 % — ABNORMAL LOW (ref 39.0–52.0)
Hemoglobin: 9.6 g/dL — ABNORMAL LOW (ref 13.0–17.0)
Lymphocytes Relative: 13 %
Lymphs Abs: 2.1 10*3/uL (ref 0.7–4.0)
MCH: 27.7 pg (ref 26.0–34.0)
MCHC: 32.4 g/dL (ref 30.0–36.0)
MCV: 85.3 fL (ref 78.0–100.0)
Monocytes Absolute: 1.4 10*3/uL — ABNORMAL HIGH (ref 0.1–1.0)
Monocytes Relative: 9 %
Neutro Abs: 12.6 10*3/uL — ABNORMAL HIGH (ref 1.7–7.7)
Neutrophils Relative %: 78 %
Platelets: 153 10*3/uL (ref 150–400)
RBC: 3.47 MIL/uL — ABNORMAL LOW (ref 4.22–5.81)
RDW: 16 % — ABNORMAL HIGH (ref 11.5–15.5)
WBC: 16.1 10*3/uL — ABNORMAL HIGH (ref 4.0–10.5)

## 2017-05-17 LAB — PHOSPHORUS: Phosphorus: 3.5 mg/dL (ref 2.5–4.6)

## 2017-05-17 LAB — PROTIME-INR
INR: 1.68
Prothrombin Time: 19.6 seconds — ABNORMAL HIGH (ref 11.4–15.2)

## 2017-05-17 LAB — GLUCOSE, CAPILLARY
Glucose-Capillary: 174 mg/dL — ABNORMAL HIGH (ref 65–99)
Glucose-Capillary: 146 mg/dL — ABNORMAL HIGH (ref 65–99)
Glucose-Capillary: 128 mg/dL — ABNORMAL HIGH (ref 65–99)
Glucose-Capillary: 128 mg/dL — ABNORMAL HIGH (ref 65–99)
Glucose-Capillary: 115 mg/dL — ABNORMAL HIGH (ref 65–99)

## 2017-05-17 LAB — COMPREHENSIVE METABOLIC PANEL
ALT: 16 U/L — ABNORMAL LOW (ref 17–63)
AST: 67 U/L — ABNORMAL HIGH (ref 15–41)
Albumin: 2.9 g/dL — ABNORMAL LOW (ref 3.5–5.0)
Alkaline Phosphatase: 60 U/L (ref 38–126)
Anion gap: 9 (ref 5–15)
BUN: 22 mg/dL — ABNORMAL HIGH (ref 6–20)
CO2: 24 mmol/L (ref 22–32)
Calcium: 8.8 mg/dL — ABNORMAL LOW (ref 8.9–10.3)
Chloride: 103 mmol/L (ref 101–111)
Creatinine, Ser: 1.36 mg/dL — ABNORMAL HIGH (ref 0.61–1.24)
GFR calc Af Amer: >60 mL/min (ref >60)
GFR calc non Af Amer: >60 mL/min (ref >60)
Glucose, Bld: 147 mg/dL — ABNORMAL HIGH (ref 65–99)
Potassium: 4.4 mmol/L (ref 3.5–5.1)
Sodium: 136 mmol/L (ref 135–145)
Total Bilirubin: 3.2 mg/dL — ABNORMAL HIGH (ref 0.3–1.2)
Total Protein: 6.7 g/dL (ref 6.5–8.1)

## 2017-05-17 LAB — POCT I-STAT, CHEM 8
BUN: 22 mg/dL — ABNORMAL HIGH (ref 6–20)
Calcium, Ion: 1.15 mmol/L (ref 1.15–1.40)
Chloride: 98 mmol/L — ABNORMAL LOW (ref 101–111)
Creatinine, Ser: 1.3 mg/dL — ABNORMAL HIGH (ref 0.61–1.24)
Glucose, Bld: 116 mg/dL — ABNORMAL HIGH (ref 65–99)
HCT: 27 % — ABNORMAL LOW (ref 39.0–52.0)
Hemoglobin: 9.2 g/dL — ABNORMAL LOW (ref 13.0–17.0)
Potassium: 4 mmol/L (ref 3.5–5.1)
Sodium: 135 mmol/L (ref 135–145)
TCO2: 26 mmol/L (ref 22–32)

## 2017-05-17 LAB — POCT I-STAT 3, ART BLOOD GAS (G3+)
Bicarbonate: 23.4 mmol/L (ref 20.0–28.0)
O2 Saturation: 95 %
Patient temperature: 37.2
TCO2: 24 mmol/L (ref 22–32)
pCO2 arterial: 34.6 mmHg (ref 32.0–48.0)
pH, Arterial: 7.438 (ref 7.350–7.450)
pO2, Arterial: 76 mmHg — ABNORMAL LOW (ref 83.0–108.0)

## 2017-05-17 LAB — LACTATE DEHYDROGENASE: LDH: 317 U/L — ABNORMAL HIGH (ref 98–192)

## 2017-05-17 LAB — MAGNESIUM: Magnesium: 1.9 mg/dL (ref 1.7–2.4)

## 2017-05-17 MED ORDER — PRO-STAT SUGAR FREE PO LIQD: 30.0000 mL | Freq: Two times a day (BID) | ORAL | Status: DC

## 2017-05-17 MED ORDER — EPINEPHRINE PF 1 MG/ML IJ SOLN
1.0000 ug/min | INTRAVENOUS | Status: DC
  Administered 2017-05-17: 1 ug/min via INTRAVENOUS
  Filled 2017-05-17: qty 4

## 2017-05-17 MED ORDER — CLONAZEPAM 1 MG PO TABS
1.0000 mg | ORAL_TABLET | Freq: Every day | ORAL | Status: DC
  Administered 2017-05-17 – 2017-06-03 (×18): 1 mg via ORAL
  Filled 2017-05-17 (×18): qty 1

## 2017-05-17 MED ORDER — CHLORHEXIDINE GLUCONATE CLOTH 2 % EX PADS
6.0000 | MEDICATED_PAD | Freq: Every day | CUTANEOUS | Status: DC
  Administered 2017-05-17 – 2017-05-25 (×9): 6 via TOPICAL

## 2017-05-17 MED ORDER — PRO-STAT SUGAR FREE PO LIQD
30.0000 mL | Freq: Two times a day (BID) | ORAL | Status: DC
  Administered 2017-05-17 – 2017-06-04 (×31): 30 mL via ORAL
  Filled 2017-05-17 (×35): qty 30

## 2017-05-17 MED ORDER — DOCUSATE SODIUM 100 MG PO CAPS
200.0000 mg | ORAL_CAPSULE | Freq: Every day | ORAL | Status: DC
  Administered 2017-05-17 – 2017-06-02 (×14): 200 mg via ORAL
  Filled 2017-05-17 (×17): qty 2

## 2017-05-17 MED ORDER — SODIUM CHLORIDE 0.9 % IV SOLN
1.5000 g | Freq: Two times a day (BID) | INTRAVENOUS | Status: AC
  Administered 2017-05-17 – 2017-05-19 (×4): 1.5 g via INTRAVENOUS
  Filled 2017-05-17 (×4): qty 1.5

## 2017-05-17 MED ORDER — FUROSEMIDE 10 MG/ML IJ SOLN
20.0000 mg | Freq: Once | INTRAMUSCULAR | Status: AC
  Administered 2017-05-17: 20 mg via INTRAVENOUS
  Filled 2017-05-17: qty 2

## 2017-05-17 MED ORDER — AMIODARONE HCL 200 MG PO TABS
200.0000 mg | ORAL_TABLET | Freq: Two times a day (BID) | ORAL | Status: DC
  Administered 2017-05-17 – 2017-05-27 (×22): 200 mg via ORAL
  Filled 2017-05-17 (×23): qty 1

## 2017-05-17 MED ORDER — FUROSEMIDE 10 MG/ML IJ SOLN: 40.0000 mg | Freq: Once | INTRAMUSCULAR | Status: DC

## 2017-05-17 MED ORDER — VANCOMYCIN HCL IN DEXTROSE 1-5 GM/200ML-% IV SOLN
1000.0000 mg | Freq: Two times a day (BID) | INTRAVENOUS | Status: DC
  Administered 2017-05-17 (×2): 1000 mg via INTRAVENOUS
  Filled 2017-05-17 (×3): qty 200

## 2017-05-17 MED ORDER — WARFARIN SODIUM 2.5 MG PO TABS
2.5000 mg | ORAL_TABLET | Freq: Once | ORAL | Status: AC
  Administered 2017-05-17: 2.5 mg via ORAL
  Filled 2017-05-17: qty 1

## 2017-05-17 MED ORDER — MIDAZOLAM HCL 2 MG/2ML IJ SOLN: 1.0000 mg | INTRAMUSCULAR | Status: DC | PRN

## 2017-05-17 NOTE — Progress Notes (Signed)
Peripherally Inserted Central Catheter/Midline Placement  The IV Nurse has discussed with the patient and/or persons authorized to consent for the patient, the purpose of this procedure and the potential benefits and risks involved with this procedure.  The benefits include less needle sticks, lab draws from the catheter, and the patient may be discharged home with the catheter. Risks include, but not limited to, infection, bleeding, blood clot (thrombus formation), and puncture of an artery; nerve damage and irregular heartbeat and possibility to perform a PICC exchange if needed/ordered by physician.  Alternatives to this procedure were also discussed.  Bard Power PICC patient education guide, fact sheet on infection prevention and patient information card has been provided to patient /or left at bedside.    PICC/Midline Placement Documentation        Timmothy Sours 05/17/2017, 11:25 AM

## 2017-05-17 NOTE — Progress Notes (Addendum)
Patient ID: Benjamin Sherman, male   DOB: 10-20-1970, 47 y.o.   MRN: [id number]     Advanced Heart Failure Rounding Note  Subjective:    Events:  -Admitted 04/21/17 with cardiogenic shock and atrial flutter.  -Central line placed. Initial co-ox 39%. -IABP placed 04/25/17-> 04/30/17 with persistent cardiogenic shock.  -DCCV atrial flutter 4/12.   - S/p multiple teeth extractions 05/11/17.  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  -Extubated on 5/1   Remains on milrinone 0.25 mcg, Epi 1 mc, and NO 4 PPM  MAPS 70-80s. + passing gas. No BM yet  Denies SOB. Pain controlled.    Swan #s CVP 10  PAP 27/ 18 CO 5.5 CI 2.6   HMIII Pump Speed 5400, Flow 4.1, PI .3.4, and Power 3.9   11 PI events    Objective:   Weight Range:  Vital Signs:   Temp:  [97.9 F (36.6 C)-99.7 F (37.6 C)] 98.2 F (36.8 C) (05/02 0800) Pulse Rate:  [87-250] 89 (05/02 0745) Resp:  [17-35] 23 (05/02 0800) SpO2:  [81 %-100 %] 96 % (05/02 0745) FiO2 (%):  [40 %-50 %] 40 % (05/01 0847) Weight:  [212 lb 1.6 oz (96.2 kg)] 212 lb 1.6 oz (96.2 kg) (05/02 0630) Last BM Date: 05/13/17  Weight change: Filed Weights   05/15/17 0500 05/16/17 0500 05/17/17 0630  Weight: 195 lb 5.2 oz (88.6 kg) 211 lb 3.2 oz (95.8 kg) 212 lb 1.6 oz (96.2 kg)   Intake/Output:   Intake/Output Summary (Last 24 hours) at 05/17/2017 0808 Last data filed at 05/17/2017 0745 Gross per 24 hour  Intake 3267.1 ml  Output 3865 ml  Net -597.9 ml    Physical Exam  CVP 10.  Physical Exam: GENERAL: No acute distress. HEENT: normal x for edentuolos  NECK: RIJ swan Supple, JVP ~10 .  2+ bilaterally, no bruits.  No lymphadenopathy or thyromegaly appreciated.Marland Kitchen LIJ swan.    CARDIAC:  Mechanical heart sounds with LVAD hum present. Sternal dressing intact +CTs   LUNGS:  Clear to auscultation bilaterally. Dull at bases    ABDOMEN:  Soft, round, nontender, + distended. Hypoactive BS LVAD exit site:  Dressing dry and intact.  No erythema or drainage.  Stabilization device  present and accurately applied.  Driveline dressing is being changed daily per sterile technique. EXTREMITIES:  Warm and dry, no cyanosis, clubbing, rash 1+ edema  NEUROLOGIC:  Alert and oriented x 4.   No aphasia.  No dysarthria.  Affect pleasant.      Telemetry    NSR 80s personally checked.   EKG   No new tracings.   Labs    Basic Metabolic Panel: Recent Labs  Lab 05/14/17 0227 05/15/17 0400  05/15/17 1552 05/15/17 1830 05/15/17 2028 05/15/17 2130 05/16/17 0150 05/16/17 0529 05/16/17 1654 05/16/17 1701 05/17/17 0428  NA 134* 134*   < > 138 140 139  --  140 139  --  136 136  K 4.3 4.1   < > 4.0 4.4 4.6  --  4.8 4.9  --  4.4 4.4  CL 95* 97*   < > 106 103 103  --  106 105  --  104 103  CO2 29 26  --  26  --   --   --   --  24  --   --  24  GLUCOSE 119* 163*   < > 124* 142* 186*  --  110* 137*  --  185* 147*  BUN 49* 38*   < >  30* 27* 27*  --  27* 26*  --  24* 22*  CREATININE 1.47* 1.35*   < > 1.36* 1.26*  1.20 1.10 1.24 1.10 1.27* 1.49* 1.30* 1.36*  CALCIUM 9.8 9.6  --  8.3*  --   --   --   --  8.6*  --   --  8.8*  MG 1.8  --   --  2.2 2.8*  --  2.7*  --  2.5* 2.1  --  1.9  PHOS  --   --   --   --   --   --   --   --  3.7  --   --  3.5   < > = values in this interval not displayed.   Liver Function Tests: Recent Labs  Lab 05/11/17 0350 05/16/17 0529 05/17/17 0428  AST 27 59* 67*  ALT 18 12* 16*  ALKPHOS 84 48 60  BILITOT 1.1 3.2* 3.2*  PROT 9.2* 6.4* 6.7  ALBUMIN 3.0* 3.1* 2.9*   No results for input(s): LIPASE, AMYLASE in the last 168 hours. No results for input(s): AMMONIA in the last 168 hours.  CBC: Recent Labs  Lab 05/15/17 2130 05/16/17 0136 05/16/17 0150 05/16/17 0529 05/16/17 1654 05/16/17 1701 05/17/17 0428  WBC 7.7 8.4  --  11.1* 16.5*  --  16.1*  NEUTROABS  --   --   --  9.7*  --   --  PENDING  HGB 8.9* 8.9* 8.8* 9.7* 9.9* 10.5* 9.6*  HCT 27.7* 27.4* 26.0* 30.4* 30.6* 31.0* 29.6*  MCV 85.8 85.4  --  86.1 85.5  --  85.3  PLT 148* 163   --  168 160  --  153   Cardiac Enzymes: No results for input(s): CKTOTAL, CKMB, CKMBINDEX, TROPONINI in the last 168 hours. BNP: BNP (last 3 results) Recent Labs    03/25/17 1401 04/21/17 1212 05/16/17 0529  BNP 835.4* 483.0* 262.3*   ProBNP (last 3 results) No results for input(s): PROBNP in the last 8760 hours.  Other results:  Imaging: Dg Chest Port 1 View  Result Date: 05/16/2017 CLINICAL DATA:  Left ventricular assist device. EXAM: PORTABLE CHEST 1 VIEW COMPARISON:  Radiograph of May 15, 2017. FINDINGS: Stable cardiomegaly. Left ventricular assist device is unchanged in position. Left-sided chest tube is noted without pneumothorax. Endotracheal and nasogastric tubes are unchanged in position. Left internal jugular Swan-Ganz catheter is noted with tip in expected position of main pulmonary artery. Mild bibasilar subsegmental atelectasis is noted. Bony thorax is unremarkable. IMPRESSION: Left ventricular assist device in good position. Otherwise stable support apparatus. Left-sided chest tube is noted without pneumothorax. Mild bibasilar subsegmental atelectasis. Electronically Signed   By: Lupita Raider, M.D.   On: 05/16/2017 10:21   Dg Chest Port 1 View  Result Date: 05/15/2017 CLINICAL DATA:  Status post left ventricular assist device placement. EXAM: PORTABLE CHEST 1 VIEW COMPARISON:  Earlier today. FINDINGS: Interval LVAD and mediastinal and left chest tubes. No pneumothorax. Interval median sternotomy wires and upper mediastinal surgical clips. Left jugular Swan-Ganz catheter tip in the main pulmonary artery. Endotracheal tube tip 4 cm above the carina. Nasogastric tube extending through the esophagus with its tip Addison below the gastroesophageal junction and side hole in the distal esophagus. Mildly enlarged cardiac silhouette. Mild right basilar and left perihilar atelectasis. IMPRESSION: 1. Interval LVAD without pneumothorax. 2. Proximally positioned nasogastric tube. It is  recommended that this be advanced into the stomach. 3. Mild right basilar and left perihilar  atelectasis. Electronically Signed   By: Beckie Salts M.D.   On: 05/15/2017 14:18   [place] Ekg Site Rite  Result Date: 05/17/2017 If Site Rite image not attached, placement could not be confirmed due to current cardiac rhythm.   Medications:    Scheduled Medications: . acetaminophen  1,000 mg Oral Q6H   Or  . acetaminophen (TYLENOL) oral liquid 160 mg/5 mL  1,000 mg Per Tube Q6H  . amiodarone  200 mg Oral BID  . aspirin EC  325 mg Oral Daily   Or  . aspirin  324 mg Per Tube Daily   Or  . aspirin  300 mg Rectal Daily  . bisacodyl  10 mg Oral Daily   Or  . bisacodyl  10 mg Rectal Daily  . chlorhexidine  15 mL Mouth Rinse BID  . Chlorhexidine Gluconate Cloth  6 each Topical Daily  . clonazePAM  1 mg Oral QHS  . docusate  200 mg Per Tube Daily  . insulin aspart  0-24 Units Subcutaneous Q4H  . insulin detemir  18 Units Subcutaneous BID  . mouth rinse  15 mL Mouth Rinse q12n4p  . metoCLOPramide (REGLAN) injection  10 mg Intravenous Q6H  . nutrition supplement (JUVEN)  1 packet Oral BID BM  . pantoprazole  40 mg Oral Daily  . sodium chloride flush  10-40 mL Intracatheter Q12H  . sodium chloride flush  3 mL Intravenous Q12H  . warfarin  2.5 mg Oral ONCE-1800  . Warfarin - Physician Dosing Inpatient   Does not apply q1800    Infusions: . sodium chloride 20 mL/hr at 05/17/17 0700  . sodium chloride    . sodium chloride 10 mL/hr at 05/16/17 1800  . sodium chloride    . sodium chloride    . EPINEPHrine 4 mg in dextrose 5% 250 mL infusion (16 mcg/mL) 1 mcg/min (05/17/17 0745)  . furosemide (LASIX) infusion 6 mg/hr (05/17/17 0745)  . lactated ringers    . lactated ringers 20 mL/hr at 05/17/17 0700  . milrinone 0.25 mcg/kg/min (05/17/17 0745)  . norepinephrine (LEVOPHED) Adult infusion Stopped (05/16/17 2100)    PRN Medications:    Assessment:   Petra Sargeant is a 47 y.o. 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 eosinophilic cardiomyopathy, LV thrombus, hypothyroidism, DM2, CKD Stage II-III and traumatic [facility] 11/18 after fall.   Admitted with cardiogenic shock.  Plan/Discussion:   1. S/P HMIII 4/30 under DT.  Cardiac Output stable. CO-OX down a little. Remains on epi 1 mcg, mlrinone 0.25 mcg, and  NO at 4 PPM.  On 325 mg aspirin. Anticoagulation per TCTS.   2. Acute on chronic systolic HF -> cardiogenic shock: ECHO 11/2016 EF 25-30% CMRI EF 22%. Bedside echo in HF clinic EF 20% in 3/19.  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 Did not respond to steroids. Admitted 4/6 with cardiogenic shock and volume overload co-ox 38%. Echo 04/23/17: LVEF 20-25% with large LV clot, RV mild to moderately down. CVP 10 on lasix drip at 6 mg per hour.    3. Atrial flutter: New onset. S/p DCCV to NSR on 4/12.  - Maintaining  NSR. Stop IV amio. Start po amio. Continue amio drip.    4. AKI CKD Stage II-III: Likely ATN due to shock - Creatinine stable 1.36.  5. H/o LV Thrombus - now recurrent on echo 04/23/17:   6. DMII - Per TCTS.   7. Hypothyroidism: TFTs OK 04/22/17.  - TSH  elevated at 10.848 4/23. T4 and T3 normal. No change  8.. RLE erythema/cellulitis - Started doxy 4/20. Resolved.  - Uric acid 11.5 on 4/19.  - Will need to restart allopurinol in a few days.   9.  Blood clot in mouth-    Swan out today. Placing PICC. OOB.    Tonye Becket, NP  05/17/2017 8:08 AM   Advanced Heart Failure Team Pager [phone] (M-F; 7a - 4p)  Please contact [facility] for night-coverage after hours (4p -7a ) and weekends on [email or web address]   Patient seen and examined with Tonye Becket, NP. We discussed all aspects of the encounter. I agree with the assessment and plan as stated above.   Doing well POD #2 VAD. Extubated. Swan numbers look good. Start weaning inotropes. Can get swan out and place PICC. Continue lasix gtt. Rhythm stable. Start coumadin per  TCTS. Up to chair. VAD interrogated personally. Parameters stable.  [name], MD  10:58 PM

## 2017-05-17 NOTE — Progress Notes (Signed)
CT surgery p.m. Rounds  Patient up in chair for 5 hours. MAP increasing 88 mmHg We will wean off epi for MAP sustained greater than 90 mmHg Adequate urine output on Lasix drip Chest tube drainage the entire shift 100 cc Coox this p.m. 62%

## 2017-05-17 NOTE — Progress Notes (Signed)
RT to wean nitric 1ppm  every hour per order/per report. RN made RT aware no longer weaning, pt is to stay at 4 PPM until told otherwise. Pt placed back on 4 PPM and is resting comfortably. RT will continue to monitor.

## 2017-05-17 NOTE — Progress Notes (Signed)
Dr. Donata Clay made aware of PI events overnight, morning labs, and current vital signs. Order received to turn speed back to 5400. Will continue to closely monitor. Thresa Ross RN

## 2017-05-17 NOTE — Progress Notes (Signed)
Nutrition Follow-up  DOCUMENTATION CODES:   Not applicable  INTERVENTION:   -Pro-Stat 30 mL BID  -D/C Juven  -If po intake inadequate on follow-up, recommend addition of Ensure Enlive po BID, each supplement provides 350 kcal and 20 grams of protein   NUTRITION DIAGNOSIS:   Increased nutrient needs related to catabolic illness(End stage HF) as evidenced by estimated needs.  Continues   GOAL:   Patient will meet greater than or equal to 90% of their needs  Progressing  MONITOR:   PO intake, Supplement acceptance, Labs, Weight trends, I & O's  REASON FOR ASSESSMENT:   Consult Assessment of nutrition requirement/status  ASSESSMENT:   47 y/o make PMHx CHF, DM2, SAH, CKD2-3. Patient had been diagnosed with HF (ef 15%) end of last year. Presented to [facility] 4/6 w/ SOB and BLE edema x 2.5 days.  Worked up for Cardiogenic shock in setting of decompensated, worsening heart failure. He is not a transplant candidate and poor candidate for LVAD due to social situation & RV dysfunction. VAD team evaluating. Options are hospice vs high risk VAD. RD consulted for nutrition evaluation for potential LVAD.   4/26 Multiple teeth extractions 4/30 HeartMate III placed 5/1 Extubated  Diet advanced to CL this AM; pt ate 50% of tray this AM. Recorded po intake 75-100% prior to surgery.   Weight had been trending down until surgery on 4/30 (From 233 pounds down to 190 pounds). Weight up post-op, 212 pounds  Labs: Creatinine 1.36, BUN 22 Meds: lasix drip, epinephrine drip, off levophed   Diet Order:   Diet Order           Diet clear liquid Room service appropriate? Yes; Fluid consistency: Thin; Fluid restriction: 1200 mL Fluid  Diet effective now          EDUCATION NEEDS:   Education needs have been addressed  Skin:  Skin Assessment: Skin Integrity Issues: Skin Integrity Issues:: Incisions Incisions: surgical: abdomen, chest  Last BM:  05/08/17  Height:   Ht Readings from  Last 1 Encounters:  04/21/17 6' (1.829 m)    Weight:   Wt Readings from Last 1 Encounters:  05/17/17 212 lb 1.6 oz (96.2 kg)    Ideal Body Weight:  80.91 kg  BMI:  Body mass index is 28.77 kg/m.  Estimated Nutritional Needs:   Kcal:  2200-2400 kcals (24-26 kcal/kg bw)  Protein:  110-130g Pro (1.2-1.4 g/kg bw)  Fluid:  <1.5 L (MD ordered Fluid restriction)   Romelle Starcher MS, RD, LDN, CNSC [phone] Pager  [phone] Weekend/On-Call Pager

## 2017-05-17 NOTE — Progress Notes (Signed)
PT Cancellation Note  Patient Details Name: Benjamin Sherman MRN: [id number] DOB: 1970/08/04   Cancelled Treatment:    Reason Eval/Treat Not Completed: Other (comment)(attempted x 2 today, pt initially getting [name] out and now getting PICC placed. will attempt as time allows)   [name] B [name] 05/17/2017, 10:27 AM  [name], PT [phone]

## 2017-05-17 NOTE — Progress Notes (Signed)
Palliative:  Benjamin Sherman is sitting up in recliner and look great today. He did not sleep well last night and feels "achy" but no bad pain or any concerns. In good spirits. Hoping to sleep better tonight. Emotional support provided.   No charge  Yong Channel, NP Palliative Medicine Team Pager # [phone] (M-F 8a-5p) Team Phone # [phone] (Nights/Weekends)

## 2017-05-17 NOTE — Progress Notes (Signed)
Anesthesiology Follow-up:  Awake and alert, in good spirits breathing subjectively better. No further oral bleeding.  VS: T- 36.7 MAP 79  RR- 19 O2 sat 97% on 4L [place] PA 32/14 CVP- 11 CO/CI- 5.5/2.6   K-4.4 glucose 147 BUN/Cr.- 22/1.36 H/H- 9.6/29.6 Platelets- 153,000 CoX- 58%  VAD Parameters: Pump Speed: 5400 RPM Pump Flow: 3.8 LPM Power: 4 watts PI: 18.107    47 year old male with severe nonischemic cardiomyopoathy, 2 days S/P HM 3 LVAD insertion. Hemodynamically stable on Epi 1 mcg/min and milrinone 0.25 mcg/kg/min. Doing well overall.  Benjamin Sherman

## 2017-05-17 NOTE — Progress Notes (Signed)
OT Cancellation Note  Patient Details Name: Benjamin Sherman MRN: [id number] DOB: [birth year]   Cancelled Treatment:    Reason Eval/Treat Not Completed: Patient at procedure or test/ unavailable. (attempted x 2 today, pt initially getting [name] out and now getting PICC placed. will attempt as time allows)    Evern Bio [facility] 05/17/2017, 10:56 AM  Sherryl Manges OTR/L [phone]

## 2017-05-17 NOTE — Progress Notes (Signed)
LVAD Coordinator Rounding Note:  Admitted 04/21/17 with cardiogenic shock and atrial flutter.   HM III LVAD implanted on 05/15/17 by Dr. Maren Beach  under Destination Therapy criteria due to limited social support.   Vital signs: Temp:  98.1 HR: 90 Auto BP: not done Doppler:  80 O2 Sat: 96% on 4 L/[employer] Wt: 195>211>212 lbs   LVAD interrogation reveals:  Speed: 5400 Flow: 3.9 Power:  3.9w PI: 4.2 Alarms: none Events: 10 Hematocrit: 30 Fixed speed: 5400 Low speed limit: 5100  Drive Line: Existing VAD dressing removed and site care performed using sterile technique. Drive line exit site cleaned with Chlora prep applicators x 2, allowed to dry, and gauze dressing with silver strip re-applied. Exit site healing and unincorporated, the velour is fully implanted at exit site. Single stitch intact. No redness, tenderness, drainage, foul odor or rash noted.2 Drive line anchors secure.    Labs:  LDH trend: 253>317  INR trend: 1.24>1.68  Anticoagulation Plan: -INR Goal: 2.0 - 2.5  -ASA Dose:  325 mg daily (until INR therapeutic)  Blood Products:   Intra op: 05/15/17 - 1 unit plts - 1 unit FFP  Post op:  - 05/15/17>1 RBC, 2 FFP, DDAVP, Factor VII -05/16/17>1 PC  Device: N/A  Arrythmias: increased ectopy, short run Afib 05/15/17 - Amiodarone started 30 mg/hr-switched to PO today  Respiratory: extubated 05/16/17  Nitric Oxide: 4 ppm  Gtts: Milrinone> .25 mcg/kg/min Levo>8 mcg/min-off 5/2 Epi>1 mcg/min Amiodarone>30 mg/hr-off 5/2 Lasix 6 mg/hr   Adverse Events on VAD: -none  VAD Education: Pt educated on the need for hat/mask during driveline dressing change. Bedside nurse will educate pt today on changing power sources.  Plan/Recommendations:   1. Daily drive line dressing changes per VAD Coordinator or Nurse Alla Feeling.  2. Call VAD pager if any questions re: VAD equipment or drive line site/care.  [name] RN, VAD Coordinator 24/7 VAD pager: [phone]

## 2017-05-17 NOTE — Progress Notes (Signed)
HeartMate 3 Rounding Note POD # 2  Subjective:    47 year old diabetic with nonischemic cardiomyopathy admitted in cardiogenic shock with acute on chronic heart failure, ejection fraction 10%, moderate RV dysfunction, atrial flutter treated with cardioversion to sinus rhythm and mobile thrombus of left ventricular apex.  Patient required inotropic support and balloon pump support prior to completing evaluation for LVAD implantation.  Patient had satisfactory RV function at implantation and required low-dose inotropes.  Pump flow greater than 4 L/min with CVP 10-14.  Sternal closure successful. Preoperative balloon pump removed 6 hours postop.  Patient had significant bleeding from gums at site of previous dental extractions requiring factor replacement, blood transfusion of packed cells, and topical thrombostatic agent on gum line ;[ MRDH].  Now stopped bleeding.  Receiving Peridex rinses.  Patient extubated postop day 1. LVAD speed reduced to 5400 RPM  [from 5500] after PI-suction event last night.  CVP and LVAD flow remains satisfactory.  Weight still up postop and patient on Lasix drip for gentle diuresis  LVAD INTERROGATION:  HeartMate 3LVAD:  Flow 4.5  liters/min, speed 5400, power 4.0, PI 2.0.  Controller intact  Objective:    Vital Signs:   Temp:  [97.9 F (36.6 C)-99.3 F (37.4 C)] 98.1 F (36.7 C) (05/02 0815) Pulse Rate:  [87-250] 90 (05/02 0815) Resp:  [17-35] 17 (05/02 0815) SpO2:  [81 %-100 %] 97 % (05/02 0815) Weight:  [212 lb 1.6 oz (96.2 kg)] 212 lb 1.6 oz (96.2 kg) (05/02 0630) Last BM Date: 05/13/17 Mean arterial Pressure 75-80  Intake/Output:   Intake/Output Summary (Last 24 hours) at 05/17/2017 1037 Last data filed at 05/17/2017 0800 Gross per 24 hour  Intake 2817.63 ml  Output 3580 ml  Net -762.37 ml     Physical Exam: General:  Well appearing.  Breathing comfortably spontaneously HEENT: normal currently no bleeding from mouth Neck: supple. JVP normal.  Carotids  no bruits. No lymphadenopathy or thryomegaly appreciated. Cor: Mechanical heart sounds with LVAD hum present. Lungs: clear Abdomen: soft, nontender, nondistended. No hepatosplenomegaly. No bruits or masses. Good bowel sounds. Extremities: Warm, no cyanosis, clubbing, rash, edema Neuro: alert & orientedx3, cranial nerves grossly intact. moves all 4 extremities w/o difficulty. Affect pleasant  Telemetry: Sinus rhythm 94  Labs: Basic Metabolic Panel: Recent Labs  Lab 05/14/17 0227 05/15/17 0400  05/15/17 1552 05/15/17 1830 05/15/17 2028 05/15/17 2130 05/16/17 0150 05/16/17 0529 05/16/17 1654 05/16/17 1701 05/17/17 0428  NA 134* 134*   < > 138 140 139  --  140 139  --  136 136  K 4.3 4.1   < > 4.0 4.4 4.6  --  4.8 4.9  --  4.4 4.4  CL 95* 97*   < > 106 103 103  --  106 105  --  104 103  CO2 29 26  --  26  --   --   --   --  24  --   --  24  GLUCOSE 119* 163*   < > 124* 142* 186*  --  110* 137*  --  185* 147*  BUN 49* 38*   < > 30* 27* 27*  --  27* 26*  --  24* 22*  CREATININE 1.47* 1.35*   < > 1.36* 1.26*  1.20 1.10 1.24 1.10 1.27* 1.49* 1.30* 1.36*  CALCIUM 9.8 9.6  --  8.3*  --   --   --   --  8.6*  --   --  8.8*  MG 1.8  --   --  2.2 2.8*  --  2.7*  --  2.5* 2.1  --  1.9  PHOS  --   --   --   --   --   --   --   --  3.7  --   --  3.5   < > = values in this interval not displayed.    Liver Function Tests: Recent Labs  Lab 05/11/17 0350 05/16/17 0529 05/17/17 0428  AST 27 59* 67*  ALT 18 12* 16*  ALKPHOS 84 48 60  BILITOT 1.1 3.2* 3.2*  PROT 9.2* 6.4* 6.7  ALBUMIN 3.0* 3.1* 2.9*   No results for input(s): LIPASE, AMYLASE in the last 168 hours. No results for input(s): AMMONIA in the last 168 hours.  CBC: Recent Labs  Lab 05/15/17 2130 05/16/17 0136 05/16/17 0150 05/16/17 0529 05/16/17 1654 05/16/17 1701 05/17/17 0428  WBC 7.7 8.4  --  11.1* 16.5*  --  16.1*  NEUTROABS  --   --   --  9.7*  --   --  12.6*  HGB 8.9* 8.9* 8.8* 9.7* 9.9* 10.5* 9.6*   HCT 27.7* 27.4* 26.0* 30.4* 30.6* 31.0* 29.6*  MCV 85.8 85.4  --  86.1 85.5  --  85.3  PLT 148* 163  --  168 160  --  153    INR: Recent Labs  Lab 05/15/17 1328 05/15/17 1552 05/15/17 2130 05/16/17 0529 05/17/17 0428  INR 1.33 1.39 0.99 1.24 1.68    Other results:  EKG:   Imaging: Dg Chest Port 1 View  Result Date: 05/17/2017 CLINICAL DATA:  Left ventricular assist device placed 2 days ago. EXAM: PORTABLE CHEST 1 VIEW COMPARISON:  Portable chest x-ray of May 16, 2017 FINDINGS: The lungs are less well inflated today. There is some crowding of the pulmonary vascularity. There is hazy increased density at both lung bases. The cardiac silhouette remains enlarged. The pulmonary vascularity is mildly prominent centrally but stable. The Swan-Ganz catheter tip projects in the main pulmonary outflow tract. The left internal jugular venous catheter tip projects over the proximal SVC. The left chest tube tip projects over the posterolateral aspect of the sixth rib. The left ventricular assist device is in stable position. IMPRESSION: Mild hypoinflation accentuates the interstitial markings. Probable subsegmental atelectasis at both bases greatest on the left more conspicuous today but not entirely new. Stable cardiomegaly with mild central pulmonary vascular prominence. No pneumothorax nor large pleural effusion. The support tubes and devices are in reasonable position. Electronically Signed   By: David  [place] M.D.   On: 05/17/2017 09:18   Dg Chest Port 1 View  Result Date: 05/16/2017 CLINICAL DATA:  Left ventricular assist device. EXAM: PORTABLE CHEST 1 VIEW COMPARISON:  Radiograph of May 15, 2017. FINDINGS: Stable cardiomegaly. Left ventricular assist device is unchanged in position. Left-sided chest tube is noted without pneumothorax. Endotracheal and nasogastric tubes are unchanged in position. Left internal jugular Swan-Ganz catheter is noted with tip in expected position of main pulmonary  artery. Mild bibasilar subsegmental atelectasis is noted. Bony thorax is unremarkable. IMPRESSION: Left ventricular assist device in good position. Otherwise stable support apparatus. Left-sided chest tube is noted without pneumothorax. Mild bibasilar subsegmental atelectasis. Electronically Signed   By: Lupita Raider, M.D.   On: 05/16/2017 10:21   Dg Chest Port 1 View  Result Date: 05/15/2017 CLINICAL DATA:  Status post left ventricular assist device placement. EXAM: PORTABLE CHEST 1 VIEW COMPARISON:  Earlier today. FINDINGS: Interval LVAD and mediastinal and left chest  tubes. No pneumothorax. Interval median sternotomy wires and upper mediastinal surgical clips. Left jugular Swan-Ganz catheter tip in the main pulmonary artery. Endotracheal tube tip 4 cm above the carina. Nasogastric tube extending through the esophagus with its tip Addison below the gastroesophageal junction and side hole in the distal esophagus. Mildly enlarged cardiac silhouette. Mild right basilar and left perihilar atelectasis. IMPRESSION: 1. Interval LVAD without pneumothorax. 2. Proximally positioned nasogastric tube. It is recommended that this be advanced into the stomach. 3. Mild right basilar and left perihilar atelectasis. Electronically Signed   By: Beckie Salts M.D.   On: 05/15/2017 14:18   [place] Ekg Site Rite  Result Date: 05/17/2017 If Site Rite image not attached, placement could not be confirmed due to current cardiac rhythm.    Medications:     Scheduled Medications: . acetaminophen  1,000 mg Oral Q6H   Or  . acetaminophen (TYLENOL) oral liquid 160 mg/5 mL  1,000 mg Per Tube Q6H  . amiodarone  200 mg Oral BID  . aspirin EC  325 mg Oral Daily   Or  . aspirin  324 mg Per Tube Daily   Or  . aspirin  300 mg Rectal Daily  . bisacodyl  10 mg Oral Daily   Or  . bisacodyl  10 mg Rectal Daily  . chlorhexidine  15 mL Mouth Rinse BID  . Chlorhexidine Gluconate Cloth  6 each Topical Daily  . clonazePAM  1 mg Oral  QHS  . docusate sodium  200 mg Oral Daily  . insulin aspart  0-24 Units Subcutaneous Q4H  . insulin detemir  18 Units Subcutaneous BID  . mouth rinse  15 mL Mouth Rinse q12n4p  . metoCLOPramide (REGLAN) injection  10 mg Intravenous Q6H  . nutrition supplement (JUVEN)  1 packet Oral BID BM  . pantoprazole  40 mg Oral Daily  . sodium chloride flush  10-40 mL Intracatheter Q12H  . sodium chloride flush  3 mL Intravenous Q12H  . warfarin  2.5 mg Oral ONCE-1800  . Warfarin - Physician Dosing Inpatient   Does not apply q1800    Infusions: . sodium chloride 20 mL/hr at 05/17/17 0800  . sodium chloride    . sodium chloride 10 mL/hr at 05/16/17 1800  . sodium chloride    . sodium chloride    . cefUROXime (ZINACEF)  IV    . EPINEPHrine 4 mg in dextrose 5% 250 mL infusion (16 mcg/mL) 1 mcg/min (05/17/17 0745)  . furosemide (LASIX) infusion 6 mg/hr (05/17/17 0745)  . lactated ringers 20 mL/hr at 05/17/17 0800  . milrinone 0.25 mcg/kg/min (05/17/17 0745)  . norepinephrine (LEVOPHED) Adult infusion Stopped (05/16/17 2100)  . vancomycin 1,000 mg (05/17/17 0950)    PRN Medications: sodium chloride, midazolam, morphine injection, ondansetron (ZOFRAN) IV, oxyCODONE, sodium chloride flush, sodium chloride flush, traMADol   Assessment:  47 year old with nonischemic cardiomyopathy EF 10% admitted with cardiogenic shock and acute on chronic systolic heart failure  HeartMate 3 implanted April 30, preop balloon pump removed  Large LV thrombus removed intact intraoperatively Postoperative bleeding from gumline at prior dental extraction site controlled with topical hemostatic agent and product administration  Patient with moderate RV dysfunction requiring low-dose inotropic support with epinephrine and milrinone and inhaled nitric oxide.  Slowly weaning support.   Plan/Discussion:    Patient doing well now at pump speed of 5400 RPM We will remove PA catheter and radial A-line today and mobilize  patient out of bed to chair Chest tube drainage  remains significant, 800 cc of serous fluid so drains will remain in place INR 1.7, Coumadin dose this p.m. 2.5 mg  I reviewed the LVAD parameters from today, and compared the results to the patient's prior recorded data.  No programming changes were made.  The LVAD is functioning within specified parameters.  The patient performs LVAD self-test daily.  LVAD interrogation was negative for any significant power changes, alarms or PI events/speed drops.  LVAD equipment check completed and is in good working order.  Back-up equipment present.   LVAD education done on emergency procedures and precautions and reviewed exit site care.  Length of Stay: [address]  Kathlee Nations [place] III 05/17/2017, 10:37 AM

## 2017-05-17 NOTE — Addendum Note (Signed)
Addendum  created 05/17/17 0848 by Kipp Brood, MD   Sign clinical note

## 2017-05-18 ENCOUNTER — Inpatient Hospital Stay (HOSPITAL_COMMUNITY): Payer: Medicaid Other

## 2017-05-18 LAB — BLOOD GAS, ARTERIAL

## 2017-05-18 LAB — BPAM RBC
Blood Product Expiration Date: 201905232359
Blood Product Expiration Date: 201905232359
Blood Product Expiration Date: 201905232359
Blood Product Expiration Date: 201905232359
Blood Product Expiration Date: 201905242359
Blood Product Expiration Date: 201905242359
Blood Product Expiration Date: 201905242359
Blood Product Expiration Date: 201905242359
ISSUE DATE / TIME: 201904260019
ISSUE DATE / TIME: 201904260019
ISSUE DATE / TIME: 201904260019
ISSUE DATE / TIME: 201904301005
ISSUE DATE / TIME: 201904301005
ISSUE DATE / TIME: 201904301005
ISSUE DATE / TIME: 201904301844
ISSUE DATE / TIME: 201905010256
Unit Type and Rh: 6200
Unit Type and Rh: 6200
Unit Type and Rh: 6200
Unit Type and Rh: 6200
Unit Type and Rh: 6200
Unit Type and Rh: 6200
Unit Type and Rh: 6200
Unit Type and Rh: 6200

## 2017-05-18 LAB — COMPREHENSIVE METABOLIC PANEL
ALT: 15 U/L — ABNORMAL LOW (ref 17–63)
AST: 39 U/L (ref 15–41)
Albumin: 2.6 g/dL — ABNORMAL LOW (ref 3.5–5.0)
Alkaline Phosphatase: 59 U/L (ref 38–126)
Anion gap: 8 (ref 5–15)
BUN: 22 mg/dL — ABNORMAL HIGH (ref 6–20)
CO2: 28 mmol/L (ref 22–32)
Calcium: 8.9 mg/dL (ref 8.9–10.3)
Chloride: 98 mmol/L — ABNORMAL LOW (ref 101–111)
Creatinine, Ser: 1.26 mg/dL — ABNORMAL HIGH (ref 0.61–1.24)
GFR calc Af Amer: >60 mL/min (ref >60)
GFR calc non Af Amer: >60 mL/min (ref >60)
Glucose, Bld: 108 mg/dL — ABNORMAL HIGH (ref 65–99)
Potassium: 3.8 mmol/L (ref 3.5–5.1)
Sodium: 134 mmol/L — ABNORMAL LOW (ref 135–145)
Total Bilirubin: 3 mg/dL — ABNORMAL HIGH (ref 0.3–1.2)
Total Protein: 6.5 g/dL (ref 6.5–8.1)

## 2017-05-18 LAB — POCT I-STAT, CHEM 8
BUN: 21 mg/dL — ABNORMAL HIGH (ref 6–20)
Calcium, Ion: 1.19 mmol/L (ref 1.15–1.40)
Chloride: 94 mmol/L — ABNORMAL LOW (ref 101–111)
Creatinine, Ser: 0.9 mg/dL (ref 0.61–1.24)
Glucose, Bld: 105 mg/dL — ABNORMAL HIGH (ref 65–99)
HCT: 24 % — ABNORMAL LOW (ref 39.0–52.0)
Hemoglobin: 8.2 g/dL — ABNORMAL LOW (ref 13.0–17.0)
Potassium: 3.9 mmol/L (ref 3.5–5.1)
Sodium: 133 mmol/L — ABNORMAL LOW (ref 135–145)
TCO2: 23 mmol/L (ref 22–32)

## 2017-05-18 LAB — TYPE AND SCREEN
ABO/RH(D): A POS
Antibody Screen: NEGATIVE
Unit division: 0
Unit division: 0
Unit division: 0
Unit division: 0
Unit division: 0
Unit division: 0
Unit division: 0
Unit division: 0

## 2017-05-18 LAB — GLUCOSE, CAPILLARY
Glucose-Capillary: 97 mg/dL (ref 65–99)
Glucose-Capillary: 88 mg/dL (ref 65–99)
Glucose-Capillary: 126 mg/dL — ABNORMAL HIGH (ref 65–99)
Glucose-Capillary: 83 mg/dL (ref 65–99)
Glucose-Capillary: 101 mg/dL — ABNORMAL HIGH (ref 65–99)
Glucose-Capillary: 88 mg/dL (ref 65–99)

## 2017-05-18 LAB — CBC WITH DIFFERENTIAL/PLATELET
Band Neutrophils: 5 %
Basophils Absolute: 0 10*3/uL (ref 0.0–0.1)
Basophils Relative: 0 %
Blasts: 0 %
Eosinophils Absolute: 0 10*3/uL (ref 0.0–0.7)
Eosinophils Relative: 0 %
HCT: 27.6 % — ABNORMAL LOW (ref 39.0–52.0)
Hemoglobin: 8.9 g/dL — ABNORMAL LOW (ref 13.0–17.0)
Lymphocytes Relative: 4 %
Lymphs Abs: 0.6 10*3/uL — ABNORMAL LOW (ref 0.7–4.0)
MCH: 27.6 pg (ref 26.0–34.0)
MCHC: 32.2 g/dL (ref 30.0–36.0)
MCV: 85.4 fL (ref 78.0–100.0)
Metamyelocytes Relative: 0 %
Monocytes Absolute: 0.8 10*3/uL (ref 0.1–1.0)
Monocytes Relative: 5 %
Myelocytes: 0 %
Neutro Abs: 14.1 10*3/uL — ABNORMAL HIGH (ref 1.7–7.7)
Neutrophils Relative %: 86 %
Other: 0 %
Platelets: 153 10*3/uL (ref 150–400)
Promyelocytes Relative: 0 %
RBC: 3.23 MIL/uL — ABNORMAL LOW (ref 4.22–5.81)
RDW: 15.9 % — ABNORMAL HIGH (ref 11.5–15.5)
WBC: 15.5 10*3/uL — ABNORMAL HIGH (ref 4.0–10.5)
nRBC: 0 /100 WBC

## 2017-05-18 LAB — COOXEMETRY PANEL
Carboxyhemoglobin: 1.3 % (ref 0.5–1.5)
Carboxyhemoglobin: 1.4 % (ref 0.5–1.5)
Methemoglobin: 1.5 % (ref 0.0–1.5)
Methemoglobin: 0.8 % (ref 0.0–1.5)
O2 Saturation: 58.9 %
O2 Saturation: 50.2 %
Total hemoglobin: 9.1 g/dL — ABNORMAL LOW (ref 12.0–16.0)
Total hemoglobin: 12.2 g/dL (ref 12.0–16.0)

## 2017-05-18 LAB — MAGNESIUM: Magnesium: 1.7 mg/dL (ref 1.7–2.4)

## 2017-05-18 LAB — VANCOMYCIN, TROUGH: Vancomycin Tr: 26 ug/mL (ref 15–20)

## 2017-05-18 LAB — PROTIME-INR
INR: 2.19
Prothrombin Time: 24.2 seconds — ABNORMAL HIGH (ref 11.4–15.2)

## 2017-05-18 LAB — PHOSPHORUS: Phosphorus: 3.5 mg/dL (ref 2.5–4.6)

## 2017-05-18 LAB — LACTATE DEHYDROGENASE: LDH: 287 U/L — ABNORMAL HIGH (ref 98–192)

## 2017-05-18 MED ORDER — ALLOPURINOL 100 MG PO TABS
100.0000 mg | ORAL_TABLET | Freq: Every day | ORAL | Status: DC
  Administered 2017-05-18 – 2017-06-04 (×18): 100 mg via ORAL
  Filled 2017-05-18 (×18): qty 1

## 2017-05-18 MED ORDER — WARFARIN SODIUM 3 MG PO TABS
3.0000 mg | ORAL_TABLET | Freq: Every day | ORAL | Status: AC
  Administered 2017-05-18: 3 mg via ORAL
  Filled 2017-05-18: qty 1

## 2017-05-18 MED ORDER — POTASSIUM CHLORIDE 10 MEQ/50ML IV SOLN
10.0000 meq | INTRAVENOUS | Status: AC
  Administered 2017-05-18 (×2): 10 meq via INTRAVENOUS
  Filled 2017-05-18 (×2): qty 50

## 2017-05-18 MED ORDER — HYDRALAZINE HCL 20 MG/ML IJ SOLN
10.0000 mg | INTRAMUSCULAR | Status: DC | PRN
  Administered 2017-05-18: 10 mg via INTRAVENOUS
  Filled 2017-05-18: qty 1

## 2017-05-18 MED ORDER — INSULIN ASPART 100 UNIT/ML ~~LOC~~ SOLN
0.0000 [IU] | Freq: Three times a day (TID) | SUBCUTANEOUS | Status: DC
  Administered 2017-05-19 – 2017-05-21 (×4): 3 [IU] via SUBCUTANEOUS
  Administered 2017-05-21 (×2): 5 [IU] via SUBCUTANEOUS
  Administered 2017-05-22: 3 [IU] via SUBCUTANEOUS
  Administered 2017-05-22 (×2): 2 [IU] via SUBCUTANEOUS
  Administered 2017-05-23 (×2): 3 [IU] via SUBCUTANEOUS
  Administered 2017-05-24: 2 [IU] via SUBCUTANEOUS
  Administered 2017-05-24: 5 [IU] via SUBCUTANEOUS
  Administered 2017-05-25: 2 [IU] via SUBCUTANEOUS
  Administered 2017-05-26: 3 [IU] via SUBCUTANEOUS
  Administered 2017-05-26: 2 [IU] via SUBCUTANEOUS
  Administered 2017-05-27: 5 [IU] via SUBCUTANEOUS
  Administered 2017-05-28 – 2017-05-29 (×4): 2 [IU] via SUBCUTANEOUS
  Administered 2017-05-30: 3 [IU] via SUBCUTANEOUS
  Administered 2017-05-31 – 2017-06-03 (×2): 2 [IU] via SUBCUTANEOUS

## 2017-05-18 MED ORDER — LEVALBUTEROL HCL 1.25 MG/0.5ML IN NEBU
1.2500 mg | INHALATION_SOLUTION | Freq: Four times a day (QID) | RESPIRATORY_TRACT | Status: DC
  Administered 2017-05-19 (×2): 1.25 mg via RESPIRATORY_TRACT
  Filled 2017-05-18 (×3): qty 0.5

## 2017-05-18 MED ORDER — VANCOMYCIN HCL 500 MG IV SOLR
500.0000 mg | Freq: Two times a day (BID) | INTRAVENOUS | Status: DC
  Administered 2017-05-18: 500 mg via INTRAVENOUS
  Filled 2017-05-18 (×2): qty 500

## 2017-05-18 MED ORDER — INSULIN ASPART 100 UNIT/ML ~~LOC~~ SOLN
0.0000 [IU] | Freq: Every day | SUBCUTANEOUS | Status: DC
  Administered 2017-05-22: 2 [IU] via SUBCUTANEOUS

## 2017-05-18 MED ORDER — MORPHINE SULFATE (PF) 2 MG/ML IV SOLN: 2.0000 mg | INTRAVENOUS | Status: DC | PRN

## 2017-05-18 MED ORDER — FUROSEMIDE 10 MG/ML IJ SOLN
40.0000 mg | Freq: Three times a day (TID) | INTRAMUSCULAR | Status: DC
  Administered 2017-05-18 – 2017-05-19 (×4): 40 mg via INTRAVENOUS
  Filled 2017-05-18 (×4): qty 4

## 2017-05-18 MED ORDER — ASPIRIN EC 81 MG PO TBEC
81.0000 mg | DELAYED_RELEASE_TABLET | Freq: Every day | ORAL | Status: DC
  Administered 2017-05-18 – 2017-06-04 (×18): 81 mg via ORAL
  Filled 2017-05-18 (×18): qty 1

## 2017-05-18 MED ORDER — GUAIFENESIN ER 600 MG PO TB12
600.0000 mg | ORAL_TABLET | Freq: Two times a day (BID) | ORAL | Status: DC
  Administered 2017-05-18 – 2017-06-04 (×35): 600 mg via ORAL
  Filled 2017-05-18 (×35): qty 1

## 2017-05-18 MED ORDER — LEVALBUTEROL HCL 1.25 MG/0.5ML IN NEBU
1.2500 mg | INHALATION_SOLUTION | Freq: Four times a day (QID) | RESPIRATORY_TRACT | Status: DC
  Administered 2017-05-18 (×2): 1.25 mg via RESPIRATORY_TRACT
  Filled 2017-05-18 (×2): qty 0.5

## 2017-05-18 MED ORDER — BOOST PO LIQD
237.0000 mL | Freq: Three times a day (TID) | ORAL | Status: DC
  Administered 2017-05-18 – 2017-05-22 (×12): 237 mL via ORAL
  Filled 2017-05-18 (×21): qty 237

## 2017-05-18 NOTE — Progress Notes (Addendum)
Patient ID: Benjamin Sherman, male   DOB: 03-24-1970, 47 y.o.   MRN: [id number]     Advanced Heart Failure Rounding Note  Subjective:    Events:  -Admitted 04/21/17 with cardiogenic shock and atrial flutter.  -Central line placed. Initial co-ox 39%. -IABP placed 04/25/17-> 04/30/17 with persistent cardiogenic shock.  -DCCV atrial flutter 4/12.   - S/p multiple teeth extractions 05/11/17.  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  -Extubated on 5/1   Remains on milrinone 0.25 mcg + NO 2 PPM +lasix drip at 8 mg per hour.   Denies SOB. Chest mildly sore but ok. + passing gas. No BM. Rhythm stable. No orthopnea or PND. On lasix gtt.   HMIII Pump Speed 5400, Flow 4, PI .3.8  and Power 4.    Objective:   Weight Range:  Vital Signs:   Temp:  [97.7 F (36.5 C)-98.6 F (37 C)] 97.7 F (36.5 C) (05/03 0726) Pulse Rate:  [91-107] 101 (05/03 0500) Resp:  [11-26] 13 (05/03 0700) SpO2:  [87 %-100 %] 95 % (05/03 0600) Weight:  [208 lb 12.4 oz (94.7 kg)] 208 lb 12.4 oz (94.7 kg) (05/03 0500) Last BM Date: (Prior to surgery)  Weight change: Filed Weights   05/16/17 0500 05/17/17 0630 05/18/17 0500  Weight: 211 lb 3.2 oz (95.8 kg) 212 lb 1.6 oz (96.2 kg) 208 lb 12.4 oz (94.7 kg)   Intake/Output:   Intake/Output Summary (Last 24 hours) at 05/18/2017 0939 Last data filed at 05/18/2017 0800 Gross per 24 hour  Intake 2835.45 ml  Output 3430 ml  Net -594.55 ml    Physical Exam  CVP 6  Physical Exam: GENERAL: No acute distress. In bed.  HEENT: normal  Anicteric edentulous  NECK: Supple, JVP  6-7 .  2+ bilaterally, no bruits.  No lymphadenopathy or thyromegaly appreciated.   CARDIAC: Incision ok  Mechanical heart sounds with LVAD hum present. Left subclavian central line. +MT  LUNGS:  Clear to auscultation bilaterally.  ABDOMEN:  Soft, round, nontender, mildly distendedpositive bowel sounds x4.     LVAD exit site:  Dressing dry and intact.  No erythema or drainage.  Stabilization device present and accurately  applied.  Driveline dressing is being changed daily per sterile technique. EXTREMITIES:  Warm and dry, no cyanosis, clubbing, rash or edema . RUE PICC  Neuro: alert & oriented x 3, cranial nerves grossly intact. moves all 4 extremities w/o difficulty. Affect pleasant   Telemetry    NSR 80s personally reviewed.   EKG   No new tracings.   Labs    Basic Metabolic Panel: Recent Labs  Lab 05/15/17 0400  05/15/17 1552  05/15/17 2130  05/16/17 0529 05/16/17 1654 05/16/17 1701 05/17/17 0428 05/17/17 1605 05/18/17 0346  NA 134*   < > 138   < >  --    < > 139  --  136 136 135 134*  K 4.1   < > 4.0   < >  --    < > 4.9  --  4.4 4.4 4.0 3.8  CL 97*   < > 106   < >  --    < > 105  --  104 103 98* 98*  CO2 26  --  26  --   --   --  24  --   --  24  --  28  GLUCOSE 163*   < > 124*   < >  --    < > 137*  --  185*  147* 116* 108*  BUN 38*   < > 30*   < >  --    < > 26*  --  24* 22* 22* 22*  CREATININE 1.35*   < > 1.36*   < > 1.24   < > 1.27* 1.49* 1.30* 1.36* 1.30* 1.26*  CALCIUM 9.6  --  8.3*  --   --   --  8.6*  --   --  8.8*  --  8.9  MG  --   --  2.2   < > 2.7*  --  2.5* 2.1  --  1.9  --  1.7  PHOS  --   --   --   --   --   --  3.7  --   --  3.5  --  3.5   < > = values in this interval not displayed.   Liver Function Tests: Recent Labs  Lab 05/16/17 0529 05/17/17 0428 05/18/17 0346  AST 59* 67* 39  ALT 12* 16* 15*  ALKPHOS 48 60 59  BILITOT 3.2* 3.2* 3.0*  PROT 6.4* 6.7 6.5  ALBUMIN 3.1* 2.9* 2.6*   No results for input(s): LIPASE, AMYLASE in the last 168 hours. No results for input(s): AMMONIA in the last 168 hours.  CBC: Recent Labs  Lab 05/16/17 0136  05/16/17 0529 05/16/17 1654 05/16/17 1701 05/17/17 0428 05/17/17 1605 05/18/17 0346  WBC 8.4  --  11.1* 16.5*  --  16.1*  --  15.5*  NEUTROABS  --   --  9.7*  --   --  12.6*  --  14.1*  HGB 8.9*   < > 9.7* 9.9* 10.5* 9.6* 9.2* 8.9*  HCT 27.4*   < > 30.4* 30.6* 31.0* 29.6* 27.0* 27.6*  MCV 85.4  --  86.1 85.5  --   85.3  --  85.4  PLT 163  --  168 160  --  153  --  153   < > = values in this interval not displayed.   Cardiac Enzymes: No results for input(s): CKTOTAL, CKMB, CKMBINDEX, TROPONINI in the last 168 hours. BNP: BNP (last 3 results) Recent Labs    03/25/17 1401 04/21/17 1212 05/16/17 0529  BNP 835.4* 483.0* 262.3*   ProBNP (last 3 results) No results for input(s): PROBNP in the last 8760 hours.  Other results:  Imaging: Dg Chest Port 1 View  Result Date: 05/18/2017 CLINICAL DATA:  Chest tube, LVAD remains, shortness of breath EXAM: PORTABLE CHEST 1 VIEW COMPARISON:  Portable chest x-ray of 05/17/2017 FINDINGS: There is little change in poor aeration with pulmonary vascular congestion and possible small effusions right greater than left. Cardiomegaly is stable. Swan-Ganz catheter has been removed and a left central venous line remains with the tip overlying the mid SVC. Right PICC line extends to a point overlying the SVC-RA junction. Median sternotomy sutures are noted. Left chest tube is unchanged and LVAD remains. IMPRESSION: 1. Swan-Ganz catheter removed. 2. Little change in poor aeration, pulmonary vascular congestion, and probable small effusions right greater than left with cardiomegaly. Electronically Signed   By: Dwyane Dee M.D.   On: 05/18/2017 09:17   Dg Chest Port 1 View  Result Date: 05/17/2017 CLINICAL DATA:  Left ventricular assist device placed 2 days ago. EXAM: PORTABLE CHEST 1 VIEW COMPARISON:  Portable chest x-ray of May 16, 2017 FINDINGS: The lungs are less well inflated today. There is some crowding of the pulmonary vascularity. There is hazy increased density at both lung  bases. The cardiac silhouette remains enlarged. The pulmonary vascularity is mildly prominent centrally but stable. The Swan-Ganz catheter tip projects in the main pulmonary outflow tract. The left internal jugular venous catheter tip projects over the proximal SVC. The left chest tube tip projects over  the posterolateral aspect of the sixth rib. The left ventricular assist device is in stable position. IMPRESSION: Mild hypoinflation accentuates the interstitial markings. Probable subsegmental atelectasis at both bases greatest on the left more conspicuous today but not entirely new. Stable cardiomegaly with mild central pulmonary vascular prominence. No pneumothorax nor large pleural effusion. The support tubes and devices are in reasonable position. Electronically Signed   By: Sherman  [place] M.D.   On: 05/17/2017 09:18   [place] Ekg Site Rite  Result Date: 05/17/2017 If Site Rite image not attached, placement could not be confirmed due to current cardiac rhythm.   Medications:    Scheduled Medications: . acetaminophen  1,000 mg Oral Q6H   Or  . acetaminophen (TYLENOL) oral liquid 160 mg/5 mL  1,000 mg Per Tube Q6H  . amiodarone  200 mg Oral BID  . aspirin EC  81 mg Oral Daily  . bisacodyl  10 mg Oral Daily   Or  . bisacodyl  10 mg Rectal Daily  . chlorhexidine  15 mL Mouth Rinse BID  . Chlorhexidine Gluconate Cloth  6 each Topical Daily  . clonazePAM  1 mg Oral QHS  . docusate sodium  200 mg Oral Daily  . feeding supplement (PRO-STAT SUGAR FREE 64)  30 mL Oral BID  . furosemide  40 mg Intravenous Q8H  . guaiFENesin  600 mg Oral BID  . insulin aspart  0-24 Units Subcutaneous Q4H  . insulin detemir  18 Units Subcutaneous BID  . lactose free nutrition  237 mL Oral TID WC  . levalbuterol  1.25 mg Nebulization Q6H  . mouth rinse  15 mL Mouth Rinse q12n4p  . metoCLOPramide (REGLAN) injection  10 mg Intravenous Q6H  . pantoprazole  40 mg Oral Daily  . sodium chloride flush  10-40 mL Intracatheter Q12H  . warfarin  3 mg Oral q1800  . Warfarin - Physician Dosing Inpatient   Does not apply q1800    Infusions: . sodium chloride    . cefUROXime (ZINACEF)  IV Stopped (05/18/17 0710)  . furosemide (LASIX) infusion 8 mg/hr (05/18/17 0700)  . lactated ringers 10 mL/hr at 05/18/17 0815  .  milrinone 0.25 mcg/kg/min (05/18/17 0700)  . potassium chloride 10 mEq (05/18/17 0816)  . vancomycin Stopped (05/17/17 2320)    PRN Medications:    Assessment:   Benjamin Sherman is a 47 y.o. 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 eosinophilic cardiomyopathy, LV thrombus, hypothyroidism, DM2, CKD Stage II-III and traumatic [facility] 11/18 after fall.   Admitted with cardiogenic shock.  Plan/Discussion:   1. S/P HMIII 4/30 under DT.  Cardiac Output stable. CO-OX down a little.  Off all drips except milrinone 0.25 mcg + NO 2 PPM. Todays CO-OX is 58%.  LDH 287 . INR 2.2  Aspirin cut back 81 mg daily.  - Anticoagulation per TCTS.   2. Acute on chronic systolic HF -> cardiogenic shock: ECHO 11/2016 EF 25-30% CMRI EF 22%. Bedside echo in HF clinic EF 20% in 3/19.  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 Did not respond to steroids. Admitted 4/6 with cardiogenic shock and volume overload co-ox 38%. Echo 04/23/17: LVEF 20-25% with large LV clot, RV mild to  moderately down. - Volume status stable. Stopping lasix drip per Dr Maren Beach. Starting intermittent IV lasix.    3. Atrial flutter: New onset. S/p DCCV to NSR on 4/12.  - Maintaining NSR.    4. AKI CKD Stage II-III: Likely ATN due to shock - Creatinine 1.26  5. H/o LV Thrombus - now recurrent on echo 04/23/17:   6. DMII - Per TCTS.   7. Hypothyroidism: TFTs OK 04/22/17.  - TSH elevated at 10.848 4/23. T4 and T3 normal. No change  8.. RLE erythema/cellulitis - Started doxy 4/20. Resolved.  - Uric acid 11.5 on 4/19.  - Will need to restart allopurinol in a few days.   9.  Blood clot in mouth-   OOB today.     Tonye Becket, NP  05/18/2017 9:39 AM   Advanced Heart Failure Team Pager [phone] (M-F; 7a - 4p)  Please contact [facility] for night-coverage after hours (4p -7a ) and weekends on [email or web address]  Patient seen and examined with Tonye Becket, NP. We discussed all aspects of the  encounter. I agree with the assessment and plan as stated above.   Remains on milrinone for RV support. Co-ox marginal at 58%. Will continue current dose. Wean NO to off. Weight still up about 15 pounds. Continue IV lasix. Continue coumadin. Discussed dosing with PharmD personally. Ambulate. VAD teaching. RAMP echo on Monday. VAD interrogated personally. Parameters stable.  [name], MD  4:01 PM

## 2017-05-18 NOTE — Evaluation (Signed)
Occupational Therapy Evaluation Patient Details Name: Benjamin Sherman MRN: [id number] DOB: 05-Feb-1970 Today's Date: 05/18/2017    History of Present Illness 47 yo admitted with cardiogenic shock, s/p RHC and IABP insertion with continued shock 4/29 , teeth extraction 4/26, HM111 4/30, extubated 5/1. PMHx: HF, cardiomyopathy, LV thrombus, hypothyroidism, DM, CKD, traumatic [facility] 11/2016   Clinical Impression   PTA pt independent in ADL and mobility. Pt is currently max A for UB dressing, mod A for LB ADL, min guard for transfers. Pt required min cues for power source change over. Pt educated for sternal precautions with handout with initiation of application for ADL activities. Pt and sister educated for back up bag and able to verbalize end of session as well as part of sternal precautions. Pt with decreased activity tolerance, transfers and ADL who will benefit from acute therapy to maximize independence in ADL and functional transfers with sternal precautions. Next session to focus on power change-over and dressing strategies.    VSS with HR 90-94 SpO2 stable on 4L    Follow Up Recommendations  No OT follow up;Supervision/Assistance - 24 hour(initially)    Equipment Recommendations  3 in 1 bedside commode    Recommendations for Other Services       Precautions / Restrictions Precautions Precautions: Sternal Precaution Booklet Issued: Yes (comment) Precaution Comments: LVAD Restrictions Weight Bearing Restrictions: Yes(Sternal Precautions)      Mobility Bed Mobility               General bed mobility comments: in chair on arrival  Transfers Overall transfer level: Needs assistance Equipment used: None Transfers: Sit to/from Stand Sit to Stand: Min guard         General transfer comment: cues for sequence to scoot forward with reciprocal scooting, tactile cues for anterior translation and rise, cues for hands on thighs    Balance Overall balance assessment: Needs  assistance Sitting-balance support: No upper extremity supported;Feet supported Sitting balance-Leahy Scale: Good     Standing balance support: Bilateral upper extremity supported Standing balance-Leahy Scale: Fair                             ADL either performed or assessed with clinical judgement   ADL Overall ADL's : Needs assistance/impaired Eating/Feeding: Modified independent;Sitting Eating/Feeding Details (indicate cue type and reason): able to use regular utensils and feed self in recliner Grooming: Set up;Sitting Grooming Details (indicate cue type and reason): initiated education on sternal precautions during ADL and compensatory strategies Upper Body Bathing: Moderate assistance;Sitting   Lower Body Bathing: Moderate assistance;Sitting/lateral leans   Upper Body Dressing : Maximal assistance;Sitting Upper Body Dressing Details (indicate cue type and reason): to don holster Lower Body Dressing: Moderate assistance;Sit to/from stand   Toilet Transfer: Min guard;Ambulation;RW Toilet Transfer Details (indicate cue type and reason): vc for safe hand placement Toileting- Clothing Manipulation and Hygiene: Maximal assistance;Sit to/from stand Toileting - Clothing Manipulation Details (indicate cue type and reason): education for sternal precautions during rear peri care     Functional mobility during ADLs: Minimal assistance;Min guard;Rolling walker(min guard assist (+2 helpful for lines)) General ADL Comments: Pt required mod A for power change over today. Also reviewed "black bag" and all items that should be in it     Vision Patient Visual Report: No change from baseline Vision Assessment?: No apparent visual deficits     Perception     Praxis      Pertinent Vitals/Pain Pain  Assessment: No/denies pain     Hand Dominance Right   Extremity/Trunk Assessment Upper Extremity Assessment Upper Extremity Assessment: Generalized weakness(slightly  uncoordinated)   Lower Extremity Assessment Lower Extremity Assessment: Defer to PT evaluation   Cervical / Trunk Assessment Cervical / Trunk Assessment: Normal   Communication Communication Communication: No difficulties   Cognition Arousal/Alertness: Awake/alert Behavior During Therapy: WFL for tasks assessed/performed Overall Cognitive Status: Impaired/Different from baseline Area of Impairment: Problem solving                             Problem Solving: Decreased initiation;Slow processing General Comments: pt with decreased recall of precautions and will continue to assess, difficulty processing how to correctly attach power sources   General Comments  Sister present throughout session    Exercises     Shoulder Instructions      Home Living Family/patient expects to be discharged to:: Private residence Living Arrangements: Non-relatives/Friends Available Help at Discharge: Friend(s);Family;Available 24 hours/day Type of Home: House Home Access: Level entry     Home Layout: One level     Bathroom Shower/Tub: Chief Strategy Officer: Standard     Home Equipment: None   Additional Comments: friends/family 24hr care at D/C going to best friend's cousin's house at D/c and not certain of home setup; after 2 weeks will be going to his own apartment      Prior Functioning/Environment Level of Independence: Independent                 OT Problem List: Impaired balance (sitting and/or standing);Decreased coordination;Decreased knowledge of use of DME or AE;Decreased knowledge of precautions;Cardiopulmonary status limiting activity      OT Treatment/Interventions: Self-care/ADL training;DME and/or AE instruction;Therapeutic activities;Patient/family education;Balance training    OT Goals(Current goals can be found in the care plan section) Acute Rehab OT Goals Patient Stated Goal: return to playing golf OT Goal Formulation: With  patient/family Time For Goal Achievement: 06/01/17 Potential to Achieve Goals: Good ADL Goals Pt Will Perform Upper Body Dressing: with modified independence Pt Will Perform Lower Body Dressing: with modified independence;sit to/from stand Pt Will Transfer to Toilet: ambulating;with supervision Pt Will Perform Toileting - Clothing Manipulation and hygiene: with modified independence;sit to/from stand;with adaptive equipment Additional ADL Goal #1: Pt will perform bed mobility at Mod I level prior to engaging in ADL while maintaining sternal precautions Additional ADL Goal #2: Pt will demonstrate change over from power sources at mod I level for LVAD  OT Frequency: Min 2X/week   Barriers to D/C:            Co-evaluation PT/OT/SLP Co-Evaluation/Treatment: Yes Reason for Co-Treatment: Complexity of the patient's impairments (multi-system involvement);For patient/therapist safety PT goals addressed during session: Mobility/safety with mobility OT goals addressed during session: ADL's and self-care      AM-PAC PT "6 Clicks" Daily Activity     Outcome Measure Help from another person eating meals?: None Help from another person taking care of personal grooming?: A Little Help from another person toileting, which includes using toliet, bedpan, or urinal?: A Lot Help from another person bathing (including washing, rinsing, drying)?: A Lot Help from another person to put on and taking off regular upper body clothing?: A Lot Help from another person to put on and taking off regular lower body clothing?: A Lot 6 Click Score: 15   End of Session Equipment Utilized During Treatment: Gait belt;Rolling walker;Oxygen Nurse Communication: Mobility status  Activity Tolerance: Patient tolerated treatment well Patient left: in chair;with call bell/phone within reach;with family/visitor present;with nursing/sitter in room  OT Visit Diagnosis: Unsteadiness on feet (R26.81);Other (comment)(LVAD  education)                Time: 2820-6015 OT Time Calculation (min): 27 min Charges:  OT General Charges $OT Visit: 1 Visit OT Evaluation $OT Eval Moderate Complexity: 1 Mod G-Codes:     Sherryl Manges OTR/L [phone]  Benjamin Sherman 05/18/2017, 4:40 PM

## 2017-05-18 NOTE — Progress Notes (Signed)
HeartMate 3 Rounding Note POD # 3  Subjective:    47 year old diabetic with nonischemic cardiomyopathy admitted in cardiogenic shock with acute on chronic heart failure, ejection fraction 10%, moderate RV dysfunction, atrial flutter treated with cardioversion to sinus rhythm and mobile thrombus of left ventricular apex.  Patient required inotropic support and balloon pump support prior to completing evaluation for LVAD implantation.  Patient had satisfactory RV function at implantation and required low-dose inotropes.  Pump flow greater than 4 L/min with CVP 10-14.  Sternal closure successful. Preoperative balloon pump removed 6 hours postop.  Patient had significant bleeding from gums at site of previous dental extractions requiring factor replacement, blood transfusion of packed cells, and topical thrombostatic agent on gum line ;[ MRDH].  Now stopped bleeding.  Receiving Peridex rinses.  Patient had stable night- 2 PI events, MAP 75-85, 2 L urine out. Slept better  with 1 mg Klonopin  LVAD INTERROGATION:  HeartMate 3LVAD:  Flow 4.2  liters/min, speed 5400, power 4.0, PI 2.0.  Controller intact  Objective:    Vital Signs:   Temp:  [97.7 F (36.5 C)-98.6 F (37 C)] 97.9 F (36.6 C) (05/03 1140) Pulse Rate:  [91-107] 101 (05/03 0500) Resp:  [11-26] 15 (05/03 1200) SpO2:  [87 %-100 %] 95 % (05/03 0600) Weight:  [208 lb 12.4 oz (94.7 kg)] 208 lb 12.4 oz (94.7 kg) (05/03 0500) Last BM Date: (Prior to surgery) Mean arterial Pressure 75-80  Intake/Output:   Intake/Output Summary (Last 24 hours) at 05/18/2017 1250 Last data filed at 05/18/2017 1200 Gross per 24 hour  Intake 2565.67 ml  Output 3950 ml  Net -1384.33 ml     Physical Exam: General:  Well appearing.  Breathing comfortably spontaneously HEENT: normal currently no bleeding from mouth Neck: supple. JVP normal. Carotids  no bruits. No lymphadenopathy or thryomegaly appreciated. Cor: Mechanical heart sounds with LVAD hum  present. Lungs: clear Abdomen: soft, nontender, nondistended. No hepatosplenomegaly. No bruits or masses. Good bowel sounds. Extremities: Warm, no cyanosis, clubbing, rash, edema Neuro: alert & orientedx3, cranial nerves grossly intact. moves all 4 extremities w/o difficulty. Affect pleasant  Telemetry: Sinus rhythm 94  Labs: Basic Metabolic Panel: Recent Labs  Lab 05/15/17 0400  05/15/17 1552  05/15/17 2130  05/16/17 0529 05/16/17 1654 05/16/17 1701 05/17/17 0428 05/17/17 1605 05/18/17 0346  NA 134*   < > 138   < >  --    < > 139  --  136 136 135 134*  K 4.1   < > 4.0   < >  --    < > 4.9  --  4.4 4.4 4.0 3.8  CL 97*   < > 106   < >  --    < > 105  --  104 103 98* 98*  CO2 26  --  26  --   --   --  24  --   --  24  --  28  GLUCOSE 163*   < > 124*   < >  --    < > 137*  --  185* 147* 116* 108*  BUN 38*   < > 30*   < >  --    < > 26*  --  24* 22* 22* 22*  CREATININE 1.35*   < > 1.36*   < > 1.24   < > 1.27* 1.49* 1.30* 1.36* 1.30* 1.26*  CALCIUM 9.6  --  8.3*  --   --   --  8.6*  --   --  8.8*  --  8.9  MG  --   --  2.2   < > 2.7*  --  2.5* 2.1  --  1.9  --  1.7  PHOS  --   --   --   --   --   --  3.7  --   --  3.5  --  3.5   < > = values in this interval not displayed.    Liver Function Tests: Recent Labs  Lab 05/16/17 0529 05/17/17 0428 05/18/17 0346  AST 59* 67* 39  ALT 12* 16* 15*  ALKPHOS 48 60 59  BILITOT 3.2* 3.2* 3.0*  PROT 6.4* 6.7 6.5  ALBUMIN 3.1* 2.9* 2.6*   No results for input(s): LIPASE, AMYLASE in the last 168 hours. No results for input(s): AMMONIA in the last 168 hours.  CBC: Recent Labs  Lab 05/16/17 0136  05/16/17 0529 05/16/17 1654 05/16/17 1701 05/17/17 0428 05/17/17 1605 05/18/17 0346  WBC 8.4  --  11.1* 16.5*  --  16.1*  --  15.5*  NEUTROABS  --   --  9.7*  --   --  12.6*  --  14.1*  HGB 8.9*   < > 9.7* 9.9* 10.5* 9.6* 9.2* 8.9*  HCT 27.4*   < > 30.4* 30.6* 31.0* 29.6* 27.0* 27.6*  MCV 85.4  --  86.1 85.5  --  85.3  --  85.4  PLT  163  --  168 160  --  153  --  153   < > = values in this interval not displayed.    INR: Recent Labs  Lab 05/15/17 1552 05/15/17 2130 05/16/17 0529 05/17/17 0428 05/18/17 0346  INR 1.39 0.99 1.24 1.68 2.19    Other results:  EKG:   Imaging: Dg Chest Port 1 View  Result Date: 05/18/2017 CLINICAL DATA:  Chest tube, LVAD remains, shortness of breath EXAM: PORTABLE CHEST 1 VIEW COMPARISON:  Portable chest x-ray of 05/17/2017 FINDINGS: There is little change in poor aeration with pulmonary vascular congestion and possible small effusions right greater than left. Cardiomegaly is stable. Swan-Ganz catheter has been removed and a left central venous line remains with the tip overlying the mid SVC. Right PICC line extends to a point overlying the SVC-RA junction. Median sternotomy sutures are noted. Left chest tube is unchanged and LVAD remains. IMPRESSION: 1. Swan-Ganz catheter removed. 2. Little change in poor aeration, pulmonary vascular congestion, and probable small effusions right greater than left with cardiomegaly. Electronically Signed   By: Dwyane Dee M.D.   On: 05/18/2017 09:17   Dg Chest Port 1 View  Result Date: 05/17/2017 CLINICAL DATA:  Left ventricular assist device placed 2 days ago. EXAM: PORTABLE CHEST 1 VIEW COMPARISON:  Portable chest x-ray of May 16, 2017 FINDINGS: The lungs are less well inflated today. There is some crowding of the pulmonary vascularity. There is hazy increased density at both lung bases. The cardiac silhouette remains enlarged. The pulmonary vascularity is mildly prominent centrally but stable. The Swan-Ganz catheter tip projects in the main pulmonary outflow tract. The left internal jugular venous catheter tip projects over the proximal SVC. The left chest tube tip projects over the posterolateral aspect of the sixth rib. The left ventricular assist device is in stable position. IMPRESSION: Mild hypoinflation accentuates the interstitial markings. Probable  subsegmental atelectasis at both bases greatest on the left more conspicuous today but not entirely new. Stable cardiomegaly with mild central pulmonary  vascular prominence. No pneumothorax nor large pleural effusion. The support tubes and devices are in reasonable position. Electronically Signed   By: David  [place] M.D.   On: 05/17/2017 09:18   [place] Ekg Site Rite  Result Date: 05/17/2017 If Site Rite image not attached, placement could not be confirmed due to current cardiac rhythm.    Medications:     Scheduled Medications: . acetaminophen  1,000 mg Oral Q6H   Or  . acetaminophen (TYLENOL) oral liquid 160 mg/5 mL  1,000 mg Per Tube Q6H  . allopurinol  100 mg Oral Daily  . amiodarone  200 mg Oral BID  . aspirin EC  81 mg Oral Daily  . bisacodyl  10 mg Oral Daily   Or  . bisacodyl  10 mg Rectal Daily  . chlorhexidine  15 mL Mouth Rinse BID  . Chlorhexidine Gluconate Cloth  6 each Topical Daily  . clonazePAM  1 mg Oral QHS  . docusate sodium  200 mg Oral Daily  . feeding supplement (PRO-STAT SUGAR FREE 64)  30 mL Oral BID  . furosemide  40 mg Intravenous Q8H  . guaiFENesin  600 mg Oral BID  . insulin aspart  0-24 Units Subcutaneous Q4H  . insulin detemir  18 Units Subcutaneous BID  . lactose free nutrition  237 mL Oral TID WC  . levalbuterol  1.25 mg Nebulization Q6H  . mouth rinse  15 mL Mouth Rinse q12n4p  . metoCLOPramide (REGLAN) injection  10 mg Intravenous Q6H  . pantoprazole  40 mg Oral Daily  . sodium chloride flush  10-40 mL Intracatheter Q12H  . warfarin  3 mg Oral q1800  . Warfarin - Physician Dosing Inpatient   Does not apply q1800    Infusions: . sodium chloride    . cefUROXime (ZINACEF)  IV Stopped (05/18/17 0710)  . lactated ringers 10 mL/hr at 05/18/17 1200  . milrinone 0.25 mcg/kg/min (05/18/17 1200)  . vancomycin      PRN Medications: morphine injection, ondansetron (ZOFRAN) IV, oxyCODONE, sodium chloride flush, traMADol   Assessment:  47 year old  with nonischemic cardiomyopathy EF 10% admitted with cardiogenic shock and acute on chronic systolic heart failure  HeartMate 3 implanted April 30, preop balloon pump removed  Large LV thrombus removed intact intraoperatively Postoperative bleeding from gumline at prior dental extraction site controlled with topical hemostatic agent and product administration  Patient with moderate RV dysfunction requiring low-dose inotropic support - now off epinephrine and inhaled nitric oxide. Milrinone at 0.25  Plan/Discussion:   Pt progressing- ambulate in hall when off nitric oxide INR > 2.0- reduce ASA to 81 mg Transition from lasix drip to 40 mg Q 8 hrs dosing Remove one chest tube today Keep pump speed at 5400 rpm  I reviewed the LVAD parameters from today, and compared the results to the patient's prior recorded data.  No programming changes were made.  The LVAD is functioning within specified parameters.  The patient performs LVAD self-test daily.  LVAD interrogation was negative for any significant power changes, alarms or PI events/speed drops.  LVAD equipment check completed and is in good working order.  Back-up equipment present.   LVAD education done on emergency procedures and precautions and reviewed exit site care.  Length of Stay: [address]  Kathlee Nations Ivyland III 05/18/2017, 12:50 PM

## 2017-05-18 NOTE — Progress Notes (Signed)
LVAD Coordinator Rounding Note:  Admitted 04/21/17 with cardiogenic shock and atrial flutter.   HM III LVAD implanted on 05/15/17 by Dr. Maren Beach  under Destination Therapy criteria due to limited social support.   Pt sitting up in the chair this morning. Assisted nurse to get pt back to bed for dressing change and chest tube removal. Pt mobilized very well.  Vital signs: Temp:  97.7 HR: 95 Auto BP: not done Doppler:  80 O2 Sat: 95% on 4 L/[place] Wt: 195>211>212>208 lbs   LVAD interrogation reveals:  Speed: 5400 Flow: 4.1 Power:  3.9w PI: 4.1 Alarms: none Events: 3 Hematocrit: 28 Fixed speed: 5400 Low speed limit: 5100  Drive Line: Existing VAD dressing removed and site care performed using sterile technique. Drive line exit site cleaned with Chlora prep applicators x 2, allowed to dry, and gauze dressing with silver strip re-applied. Exit site healing and unincorporated, the velour is fully implanted at exit site. Single stitch intact. No redness, tenderness, drainage, foul odor or rash noted. Drive line anchor secure.    Labs:  LDH trend: 253>317>287  INR trend: 1.24>1.68>2.19  Anticoagulation Plan: -INR Goal: 2.0 - 2.5  -ASA Dose:  325 mg daily (until INR therapeutic)  Blood Products:   Intra op: 05/15/17 - 1 unit plts - 1 unit FFP  Post op:  - 05/15/17>1 RBC, 2 FFP, DDAVP, Factor VII -05/16/17>1 PC  Device: N/A  Arrythmias: increased ectopy, short run Afib 05/15/17 - Amiodarone started 30 mg/hr-switched to PO today  Respiratory: extubated 05/16/17  Nitric Oxide: 3 ppm  Gtts: Milrinone> .25 mcg/kg/min Levo>8 mcg/min-off 5/2 Epi>1 mcg/min-off 5/3 Amiodarone>30 mg/hr-off 5/2 Lasix 6 mg/hr-off 5/3   Adverse Events on VAD: -none  VAD Education: Pt educated on the need for hat/mask during driveline dressing change. Bedside nurse will educate pt today on changing power sources.  Plan/Recommendations:   1. Daily drive line dressing changes per VAD Coordinator  or Nurse Alla Feeling.  2. Call VAD pager if any questions re: VAD equipment or drive line site/care.  [name] RN, VAD Coordinator 24/7 VAD pager: [phone]

## 2017-05-18 NOTE — Evaluation (Signed)
Physical Therapy Evaluation Patient Details Name: Benjamin Sherman MRN: [id number] DOB: 1970-04-11 Today's Date: 05/18/2017   History of Present Illness  47 yo admitted with cardiogenic shock, s/p RHC and IABP insertion with continued shock 4/29 , teeth extraction 4/26, HM111 4/30, extubated 5/1. PMHx: HF, cardiomyopathy, LV thrombus, hypothyroidism, DM, CKD, traumatic [facility] 11/2016  Clinical Impression  Pt very pleasant, in chair on arrival and reports he is waiting for lunch. Pt lives with a roommate but will be D/C'd to another friend's house with support of family and friends at D/C. Pt educated for sternal precautions with handout and educated for power transition. Pt assisted to switch from main power <>batteries with min assist. Pt struggling with aligning half moons on power. Pt and sister educated for back up bag and able to verbalize end of session as well as part of sternal precautions. Pt with decreased activity tolerance, transfers and gait who will benefit from acute therapy to maximize functional mobility, gait and independence.  VSS with HR 90-94 SpO2 stable on 4L    Follow Up Recommendations Home health PT;Supervision/Assistance - 24 hour    Equipment Recommendations  Rolling walker with 5" wheels;3in1 (PT)    Recommendations for Other Services       Precautions / Restrictions Precautions Precautions: Sternal Precaution Booklet Issued: Yes (comment) Precaution Comments: LVAD      Mobility  Bed Mobility               General bed mobility comments: in chair on arrival  Transfers Overall transfer level: Needs assistance   Transfers: Sit to/from Stand Sit to Stand: Min guard         General transfer comment: cues for sequence to scoot forward with reciprocal scooting, tactile cues for anterior translation and rise, cues for hands on thighs  Ambulation/Gait Ambulation/Gait assistance: Min guard Ambulation Distance (Feet): 300 Feet Assistive device: Rolling  walker (2 wheeled) Gait Pattern/deviations: Step-through pattern;Decreased stride length   Gait velocity interpretation: 1.31 - 2.62 ft/sec, indicative of limited community ambulator General Gait Details: pt with good speed and cues x 3 for upright posture. Pt without need for standing rest with sats and HR stable  Stairs            Wheelchair Mobility    Modified Rankin (Stroke Patients Only)       Balance Overall balance assessment: Needs assistance   Sitting balance-Leahy Scale: Good       Standing balance-Leahy Scale: Fair                               Pertinent Vitals/Pain Pain Assessment: No/denies pain    Home Living Family/patient expects to be discharged to:: Private residence Living Arrangements: Non-relatives/Friends Available Help at Discharge: Friend(s);Family;Available 24 hours/day Type of Home: House Home Access: Level entry     Home Layout: One level Home Equipment: None Additional Comments: freinds/family 24hr care at D/C going to best friend's cousin's house at D/c and not certain of home setup    Prior Function Level of Independence: Independent               Hand Dominance        Extremity/Trunk Assessment   Upper Extremity Assessment Upper Extremity Assessment: Defer to OT evaluation    Lower Extremity Assessment Lower Extremity Assessment: Overall WFL for tasks assessed    Cervical / Trunk Assessment Cervical / Trunk Assessment: Normal  Communication   Communication:  No difficulties  Cognition Arousal/Alertness: Awake/alert Behavior During Therapy: WFL for tasks assessed/performed Overall Cognitive Status: Impaired/Different from baseline Area of Impairment: Problem solving                             Problem Solving: Decreased initiation;Slow processing General Comments: pt with decreased recall of precautions and will continue to assess, difficulty processing how to correctly attach power  sources      General Comments      Exercises     Assessment/Plan    PT Assessment Patient needs continued PT services  PT Problem List Decreased mobility;Decreased activity tolerance;Cardiopulmonary status limiting activity;Decreased knowledge of use of DME;Decreased knowledge of precautions       PT Treatment Interventions DME instruction;Therapeutic activities;Gait training;Therapeutic exercise;Patient/family education;Balance training;Functional mobility training;Cognitive remediation    PT Goals (Current goals can be found in the Care Plan section)  Acute Rehab PT Goals Patient Stated Goal: return to playing golf PT Goal Formulation: With patient/family Time For Goal Achievement: 06/01/17 Potential to Achieve Goals: Good    Frequency Min 4X/week   Barriers to discharge   pt reports 24hr assist    Co-evaluation PT/OT/SLP Co-Evaluation/Treatment: Yes Reason for Co-Treatment: Complexity of the patient's impairments (multi-system involvement);For patient/therapist safety PT goals addressed during session: Mobility/safety with mobility         AM-PAC PT "6 Clicks" Daily Activity  Outcome Measure Difficulty turning over in bed (including adjusting bedclothes, sheets and blankets)?: A Little Difficulty moving from lying on back to sitting on the side of the bed? : A Lot Difficulty sitting down on and standing up from a chair with arms (e.g., wheelchair, bedside commode, etc,.)?: A Little Help needed moving to and from a bed to chair (including a wheelchair)?: A Little Help needed walking in hospital room?: A Little Help needed climbing 3-5 steps with a railing? : A Lot 6 Click Score: 16    End of Session Equipment Utilized During Treatment: Gait belt Activity Tolerance: Patient tolerated treatment well Patient left: in chair;with call bell/phone within reach;with family/visitor present;with nursing/sitter in room Nurse Communication: Mobility status;Precautions PT  Visit Diagnosis: Other abnormalities of gait and mobility (R26.89)    Time: 1610-9604 PT Time Calculation (min) (ACUTE ONLY): 38 min   Charges:   PT Evaluation $PT Eval Moderate Complexity: 1 Mod PT Treatments $Gait Training: 8-22 mins   PT G Codes:        [name], PT [phone]   Oleda Borski B Lillianah Swartzentruber 05/18/2017, 1:44 PM

## 2017-05-18 NOTE — Progress Notes (Signed)
Pharmacy Antibiotic Note  Benjamin Sherman is a 47 y.o. male s/p HM3 implant on 4/30.  Pharmacy has been consulted for vancomycin dosing for surgical prophylaxis. Vancomycin trough supratherapeutic this morning at 26 mcg/ml, drawn ~30 minutes early. SCr and UOP stable.  Plan: -Reduce vancomycin 500mg  IV q12h starting tonight -Monitor renal function, LOT, repeat VT as indicated   Height: 6' (182.9 cm) Weight: 208 lb 12.4 oz (94.7 kg) IBW/kg (Calculated) : 77.6  Temp (24hrs), Avg:98.1 F (36.7 C), Min:97.7 F (36.5 C), Max:98.6 F (37 C)  Recent Labs  Lab 05/16/17 0136  05/16/17 0529 05/16/17 1654 05/16/17 1701 05/17/17 0428 05/17/17 1605 05/18/17 0346 05/18/17 0823  WBC 8.4  --  11.1* 16.5*  --  16.1*  --  15.5*  --   CREATININE  --    < > 1.27* 1.49* 1.30* 1.36* 1.30* 1.26*  --   VANCOTROUGH  --   --   --   --   --   --   --   --  26*   < > = values in this interval not displayed.    Estimated Creatinine Clearance: 85.6 mL/min (A) (by C-G formula based on SCr of 1.26 mg/dL (H)).    Allergies  Allergen Reactions  . Bee Venom     UNSPECIFIED REACTION     Antimicrobials this admission: 4/30 Cefuroxime >> 4/30 Vancomycin >> Periop fluconazole/rifampin  Dose adjustments this admission: 5/3 VT = 26 (true trough ~24) > reduce 500mg  q12h  Microbiology results:  4/29 MRSA PCR: negative  Thank you for allowing pharmacy to be a part of this patient's care.  Fredonia Highland, PharmD, BCPS PGY-2 Cardiology Pharmacy Resident Pager: [phone] 05/18/2017

## 2017-05-19 ENCOUNTER — Inpatient Hospital Stay (HOSPITAL_COMMUNITY): Payer: Medicaid Other

## 2017-05-19 LAB — LACTATE DEHYDROGENASE: LDH: 263 U/L — ABNORMAL HIGH (ref 98–192)

## 2017-05-19 LAB — GLUCOSE, CAPILLARY
Glucose-Capillary: 74 mg/dL (ref 65–99)
Glucose-Capillary: 105 mg/dL — ABNORMAL HIGH (ref 65–99)
Glucose-Capillary: 167 mg/dL — ABNORMAL HIGH (ref 65–99)
Glucose-Capillary: 100 mg/dL — ABNORMAL HIGH (ref 65–99)

## 2017-05-19 LAB — COMPREHENSIVE METABOLIC PANEL
ALT: 13 U/L — ABNORMAL LOW (ref 17–63)
AST: 26 U/L (ref 15–41)
Albumin: 2.5 g/dL — ABNORMAL LOW (ref 3.5–5.0)
Alkaline Phosphatase: 64 U/L (ref 38–126)
Anion gap: 8 (ref 5–15)
BUN: 25 mg/dL — ABNORMAL HIGH (ref 6–20)
CO2: 30 mmol/L (ref 22–32)
Calcium: 8.9 mg/dL (ref 8.9–10.3)
Chloride: 97 mmol/L — ABNORMAL LOW (ref 101–111)
Creatinine, Ser: 1.13 mg/dL (ref 0.61–1.24)
GFR calc Af Amer: >60 mL/min (ref >60)
GFR calc non Af Amer: >60 mL/min (ref >60)
Glucose, Bld: 87 mg/dL (ref 65–99)
Potassium: 3.5 mmol/L (ref 3.5–5.1)
Sodium: 135 mmol/L (ref 135–145)
Total Bilirubin: 2.2 mg/dL — ABNORMAL HIGH (ref 0.3–1.2)
Total Protein: 6.6 g/dL (ref 6.5–8.1)

## 2017-05-19 LAB — PHOSPHORUS: Phosphorus: 3.8 mg/dL (ref 2.5–4.6)

## 2017-05-19 LAB — COOXEMETRY PANEL
Carboxyhemoglobin: 1.1 % (ref 0.5–1.5)
Methemoglobin: 1.4 % (ref 0.0–1.5)
O2 Saturation: 59.6 %
Total hemoglobin: 9.5 g/dL — ABNORMAL LOW (ref 12.0–16.0)

## 2017-05-19 LAB — CBC WITH DIFFERENTIAL/PLATELET
Basophils Absolute: 0 10*3/uL (ref 0.0–0.1)
Basophils Relative: 0 %
Eosinophils Absolute: 0.1 10*3/uL (ref 0.0–0.7)
Eosinophils Relative: 1 %
HCT: 27.2 % — ABNORMAL LOW (ref 39.0–52.0)
Hemoglobin: 8.9 g/dL — ABNORMAL LOW (ref 13.0–17.0)
Lymphocytes Relative: 8 %
Lymphs Abs: 0.9 10*3/uL (ref 0.7–4.0)
MCH: 27.9 pg (ref 26.0–34.0)
MCHC: 32.7 g/dL (ref 30.0–36.0)
MCV: 85.3 fL (ref 78.0–100.0)
Monocytes Absolute: 1.5 10*3/uL — ABNORMAL HIGH (ref 0.1–1.0)
Monocytes Relative: 13 %
Neutro Abs: 9.1 10*3/uL — ABNORMAL HIGH (ref 1.7–7.7)
Neutrophils Relative %: 78 %
Platelets: 186 10*3/uL (ref 150–400)
RBC: 3.19 MIL/uL — ABNORMAL LOW (ref 4.22–5.81)
RDW: 16 % — ABNORMAL HIGH (ref 11.5–15.5)
WBC: 11.7 10*3/uL — ABNORMAL HIGH (ref 4.0–10.5)

## 2017-05-19 LAB — MAGNESIUM: Magnesium: 1.7 mg/dL (ref 1.7–2.4)

## 2017-05-19 LAB — PROTIME-INR
INR: 2.29
Prothrombin Time: 25 seconds — ABNORMAL HIGH (ref 11.4–15.2)

## 2017-05-19 MED ORDER — WARFARIN SODIUM 3 MG PO TABS
3.0000 mg | ORAL_TABLET | Freq: Every day | ORAL | Status: AC
  Administered 2017-05-19: 3 mg via ORAL
  Filled 2017-05-19: qty 1

## 2017-05-19 MED ORDER — FUROSEMIDE 10 MG/ML IJ SOLN
40.0000 mg | Freq: Two times a day (BID) | INTRAMUSCULAR | Status: DC
  Administered 2017-05-19: 40 mg via INTRAVENOUS
  Filled 2017-05-19 (×2): qty 4

## 2017-05-19 MED ORDER — POTASSIUM CHLORIDE 10 MEQ/50ML IV SOLN
10.0000 meq | INTRAVENOUS | Status: AC
  Administered 2017-05-19 (×2): 10 meq via INTRAVENOUS
  Filled 2017-05-19 (×2): qty 50

## 2017-05-19 MED ORDER — HYDRALAZINE HCL 20 MG/ML IJ SOLN
10.0000 mg | INTRAMUSCULAR | Status: DC | PRN
  Administered 2017-05-20 – 2017-05-31 (×3): 10 mg via INTRAVENOUS
  Filled 2017-05-19 (×3): qty 1

## 2017-05-19 MED ORDER — MAGNESIUM SULFATE 2 GM/50ML IV SOLN
2.0000 g | Freq: Once | INTRAVENOUS | Status: AC
  Administered 2017-05-19: 2 g via INTRAVENOUS
  Filled 2017-05-19: qty 50

## 2017-05-19 MED ORDER — POTASSIUM CHLORIDE CRYS ER 20 MEQ PO TBCR
40.0000 meq | EXTENDED_RELEASE_TABLET | Freq: Two times a day (BID) | ORAL | Status: DC
  Administered 2017-05-19 – 2017-05-22 (×6): 40 meq via ORAL
  Filled 2017-05-19 (×6): qty 2

## 2017-05-19 MED ORDER — SODIUM CHLORIDE 0.9 % IV SOLN
500.0000 mg | Freq: Two times a day (BID) | INTRAVENOUS | Status: DC
  Administered 2017-05-19 (×2): 500 mg via INTRAVENOUS
  Filled 2017-05-19 (×3): qty 500

## 2017-05-19 MED ORDER — POTASSIUM CHLORIDE 10 MEQ/50ML IV SOLN
10.0000 meq | INTRAVENOUS | Status: AC
  Administered 2017-05-19 (×3): 10 meq via INTRAVENOUS
  Filled 2017-05-19 (×3): qty 50

## 2017-05-19 NOTE — Progress Notes (Signed)
K+= 3.5 and creat= 1.13 w/ urine o/p > 30cc/hr; TCTS KCL protocol initiated with 10 mEq KCL in 50cc IV x 3, each over one hour.

## 2017-05-19 NOTE — Progress Notes (Signed)
Patient ID: Benjamin Sherman, male   DOB: 01-19-1970, 47 y.o.   MRN: [id number]    Advanced Heart Failure Rounding Note  Subjective:    Events:  -Admitted 04/21/17 with cardiogenic shock and atrial flutter.  -Central line placed. Initial co-ox 39%. -IABP placed 04/25/17-> 04/30/17 with persistent cardiogenic shock.  -DCCV atrial flutter 4/12.   -S/p multiple teeth extractions 05/11/17.  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  -Extubated on 5/1   Remains on milrinone 0.25 mcg/kg/min.  Co-ox 60% today. CVP 8.  I/O -729 yesterday.   No dyspnea.  Walked around the unit today.  Says he feels fine.   HMIII Pump Speed 5400, Flow 4.1, PI 3.7  and Power 3.9. Multiple PI events over the last day.    Objective:   Weight Range:  Vital Signs:   Temp:  [97.6 F (36.4 C)-98.2 F (36.8 C)] 97.6 F (36.4 C) (05/04 0000) Pulse Rate:  [89-95] 89 (05/04 0700) Resp:  [12-22] 20 (05/04 0700) SpO2:  [93 %-100 %] 98 % (05/04 0843) Weight:  [205 lb 11 oz (93.3 kg)] 205 lb 11 oz (93.3 kg) (05/04 0600) Last BM Date: (Prior to surgery)  Weight change: Filed Weights   05/17/17 0630 05/18/17 0500 05/19/17 0600  Weight: 212 lb 1.6 oz (96.2 kg) 208 lb 12.4 oz (94.7 kg) 205 lb 11 oz (93.3 kg)   Intake/Output:   Intake/Output Summary (Last 24 hours) at 05/19/2017 1033 Last data filed at 05/19/2017 0900 Gross per 24 hour  Intake 2449.1 ml  Output 2970 ml  Net -520.9 ml    Physical Exam  CVP 8  Physical Exam: GENERAL: Well appearing this am. NAD.  HEENT: Normal. NECK: Supple, JVP 7-8 cm. Carotids OK.  CARDIAC:  Mechanical heart sounds with LVAD hum present.  LUNGS:  CTAB, normal effort.  ABDOMEN:  NT, ND, no HSM. No bruits or masses. +BS  LVAD exit site: Well-healed and incorporated. Dressing dry and intact. No erythema or drainage. Stabilization device present and accurately applied. Driveline dressing changed daily per sterile technique. EXTREMITIES:  Warm and dry. No cyanosis, clubbing, rash, or edema.  NEUROLOGIC:   Alert & oriented x 3. Cranial nerves grossly intact. Moves all 4 extremities w/o difficulty. Affect pleasant     Telemetry    NSR 80s personally reviewed.   EKG   No new tracings.   Labs    Basic Metabolic Panel: Recent Labs  Lab 05/15/17 1552  05/16/17 0529 05/16/17 1654  05/17/17 0428 05/17/17 1605 05/18/17 0346 05/18/17 1703 05/19/17 0325  NA 138   < > 139  --    < > 136 135 134* 133* 135  K 4.0   < > 4.9  --    < > 4.4 4.0 3.8 3.9 3.5  CL 106   < > 105  --    < > 103 98* 98* 94* 97*  CO2 26  --  24  --   --  24  --  28  --  30  GLUCOSE 124*   < > 137*  --    < > 147* 116* 108* 105* 87  BUN 30*   < > 26*  --    < > 22* 22* 22* 21* 25*  CREATININE 1.36*   < > 1.27* 1.49*   < > 1.36* 1.30* 1.26* 0.90 1.13  CALCIUM 8.3*  --  8.6*  --   --  8.8*  --  8.9  --  8.9  MG 2.2   < >  2.5* 2.1  --  1.9  --  1.7  --  1.7  PHOS  --   --  3.7  --   --  3.5  --  3.5  --  3.8   < > = values in this interval not displayed.   Liver Function Tests: Recent Labs  Lab 05/16/17 0529 05/17/17 0428 05/18/17 0346 05/19/17 0325  AST 59* 67* 39 26  ALT 12* 16* 15* 13*  ALKPHOS 48 60 59 64  BILITOT 3.2* 3.2* 3.0* 2.2*  PROT 6.4* 6.7 6.5 6.6  ALBUMIN 3.1* 2.9* 2.6* 2.5*   No results for input(s): LIPASE, AMYLASE in the last 168 hours. No results for input(s): AMMONIA in the last 168 hours.  CBC: Recent Labs  Lab 05/16/17 0529 05/16/17 1654  05/17/17 0428 05/17/17 1605 05/18/17 0346 05/18/17 1703 05/19/17 0325  WBC 11.1* 16.5*  --  16.1*  --  15.5*  --  11.7*  NEUTROABS 9.7*  --   --  12.6*  --  14.1*  --  9.1*  HGB 9.7* 9.9*   < > 9.6* 9.2* 8.9* 8.2* 8.9*  HCT 30.4* 30.6*   < > 29.6* 27.0* 27.6* 24.0* 27.2*  MCV 86.1 85.5  --  85.3  --  85.4  --  85.3  PLT 168 160  --  153  --  153  --  186   < > = values in this interval not displayed.   Cardiac Enzymes: No results for input(s): CKTOTAL, CKMB, CKMBINDEX, TROPONINI in the last 168 hours. BNP: BNP (last 3 results) Recent  Labs    03/25/17 1401 04/21/17 1212 05/16/17 0529  BNP 835.4* 483.0* 262.3*   ProBNP (last 3 results) No results for input(s): PROBNP in the last 8760 hours.  Other results:  Imaging: Dg Chest Port 1 View  Result Date: 05/19/2017 CLINICAL DATA:  LVAD. EXAM: PORTABLE CHEST 1 VIEW COMPARISON:  Chest x-ray from yesterday. FINDINGS: Unchanged left internal jugular central venous catheter, right PICC line, left chest tube, and LVAD device. Stable cardiomegaly. Normal pulmonary vascularity. Unchanged small left pleural effusion and left basilar atelectasis. Improved aeration at the right lung base. Small right pleural effusion. IMPRESSION: 1. Improved aeration at the right lung base. Unchanged small bilateral pleural effusions and left basilar atelectasis. Electronically Signed   By: Obie Dredge M.D.   On: 05/19/2017 07:31   Dg Chest Port 1 View  Result Date: 05/18/2017 CLINICAL DATA:  Chest tube, LVAD remains, shortness of breath EXAM: PORTABLE CHEST 1 VIEW COMPARISON:  Portable chest x-ray of 05/17/2017 FINDINGS: There is little change in poor aeration with pulmonary vascular congestion and possible small effusions right greater than left. Cardiomegaly is stable. Swan-Ganz catheter has been removed and a left central venous line remains with the tip overlying the mid SVC. Right PICC line extends to a point overlying the SVC-RA junction. Median sternotomy sutures are noted. Left chest tube is unchanged and LVAD remains. IMPRESSION: 1. Swan-Ganz catheter removed. 2. Little change in poor aeration, pulmonary vascular congestion, and probable small effusions right greater than left with cardiomegaly. Electronically Signed   By: Dwyane Dee M.D.   On: 05/18/2017 09:17    Medications:    Scheduled Medications: . acetaminophen  1,000 mg Oral Q6H   Or  . acetaminophen (TYLENOL) oral liquid 160 mg/5 mL  1,000 mg Per Tube Q6H  . allopurinol  100 mg Oral Daily  . amiodarone  200 mg Oral BID  .  aspirin EC  81 mg Oral Daily  . bisacodyl  10 mg Oral Daily   Or  . bisacodyl  10 mg Rectal Daily  . chlorhexidine  15 mL Mouth Rinse BID  . Chlorhexidine Gluconate Cloth  6 each Topical Daily  . clonazePAM  1 mg Oral QHS  . docusate sodium  200 mg Oral Daily  . feeding supplement (PRO-STAT SUGAR FREE 64)  30 mL Oral BID  . furosemide  40 mg Intravenous BID  . guaiFENesin  600 mg Oral BID  . insulin aspart  0-15 Units Subcutaneous TID WC  . insulin aspart  0-5 Units Subcutaneous QHS  . lactose free nutrition  237 mL Oral TID WC  . levalbuterol  1.25 mg Nebulization QID  . mouth rinse  15 mL Mouth Rinse q12n4p  . metoCLOPramide (REGLAN) injection  10 mg Intravenous Q6H  . pantoprazole  40 mg Oral Daily  . potassium chloride  40 mEq Oral BID  . sodium chloride flush  10-40 mL Intracatheter Q12H  . warfarin  3 mg Oral q1800  . Warfarin - Physician Dosing Inpatient   Does not apply q1800    Infusions: . sodium chloride    . lactated ringers 10 mL/hr at 05/19/17 0400  . magnesium sulfate 1 - 4 g bolus IVPB    . milrinone 0.25 mcg/kg/min (05/19/17 0700)  . potassium chloride Stopped (05/19/17 1025)  . vancomycin Stopped (05/19/17 1112)    PRN Medications:    Assessment:   Benjamin Sherman is a 47 y.o. 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 eosinophilic cardiomyopathy, LV thrombus, hypothyroidism, DM2, CKD Stage II-III and traumatic [facility] 11/18 after fall.   Admitted with cardiogenic shock.  Plan/Discussion:    1. S/P HMIII 4/30 under DT.  Off all drips except milrinone 0.25 mcg. Co-ox 60% today with CVP 8. LDH 263.  Multiple PI events for > 24 hours, he has no complaints. .  - Continue ASA 81 daily.  - warfarin for goal INR 2-2.5.   2. Acute on chronic systolic HF -> cardiogenic shock:  Nonischemic cardiomyopathy. ECHO 11/2016 EF 25-30% CMRI EF 22%. Bedside echo in HF clinic EF 20% in 3/19.  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  Did not respond to steroids. Admitted 4/6 with cardiogenic shock and volume overload co-ox 38%. Echo 04/23/17: LVEF 20-25% with large LV clot, RV mild to moderately down.  Co-ox 60%, CVP 8.  - Continue Lasix 40 mg IV bid.  - Continue milrinone 0.25 today, will try to decrease dose tomorrow.  3. Atrial flutter: New onset. S/p DCCV to NSR on 4/12.  - Maintaining NSR on oral amiodarone.  4. AKI CKD Stage II-III: Likely ATN due to shock.  Resolved.  5. H/o LV Thrombus - now recurrent on echo 04/23/17:  6. DMII - Per TCTS.  7. Hypothyroidism: TFTs OK 04/22/17. TSH elevated at 10.848 4/23. T4 and T3 normal. No change 8. RLE erythema/cellulitis: Started doxy 4/20. Resolved.   Continue to walk in halls.   Marca Ancona, MD  05/19/2017 10:33 AM   Advanced Heart Failure Team Pager [phone] (M-F; 7a - 4p)  Please contact [facility] for night-coverage after hours (4p -7a ) and weekends on [email or web address]

## 2017-05-19 NOTE — Progress Notes (Signed)
HeartMate 3 Rounding Note POD # 4  Subjective:    47 year old diabetic with nonischemic cardiomyopathy admitted in cardiogenic shock with acute on chronic heart failure, ejection fraction 10%, moderate RV dysfunction, atrial flutter treated with cardioversion to sinus rhythm and mobile thrombus of left ventricular apex.  Patient required inotropic support and balloon pump support prior to completing evaluation for LVAD implantation.  Patient had satisfactory RV function at implantation and required low-dose inotropes.  Pump flow greater than 4 L/min with CVP 10-14.  Sternal closure successful. Preoperative balloon pump removed 6 hours postop.  Patient had significant bleeding from gums at site of previous dental extractions requiring factor replacement, blood transfusion of packed cells, and topical thrombostatic agent on gum line ;[ MRDH].  Now stopped bleeding.  Receiving Peridex rinses.  Patient had stable night- few PI events, MAP 75-85, 2 L urine out. Now on room air, wt down 3 lbs  LVAD INTERROGATION:  HeartMate 3LVAD:  Flow 4.5  liters/min, speed 5400, power 4.0, PI 2.0.  Controller intact  Objective:    Vital Signs:   Temp:  [97.6 F (36.4 C)-98.2 F (36.8 C)] 97.6 F (36.4 C) (05/04 0000) Pulse Rate:  [89-95] 89 (05/04 0700) Resp:  [12-24] 20 (05/04 0700) SpO2:  [93 %-100 %] 93 % (05/04 0700) Weight:  [205 lb 11 oz (93.3 kg)] 205 lb 11 oz (93.3 kg) (05/04 0600) Last BM Date: (Prior to surgery) Mean arterial Pressure 75-80  Intake/Output:   Intake/Output Summary (Last 24 hours) at 05/19/2017 0823 Last data filed at 05/19/2017 0800 Gross per 24 hour  Intake 2046.9 ml  Output 2970 ml  Net -923.1 ml     Physical Exam: General:  Well appearing.  Breathing comfortably spontaneously HEENT: normal currently no bleeding from mouth Neck: supple. JVP normal. Carotids  no bruits. No lymphadenopathy or thryomegaly appreciated. Cor: Mechanical heart sounds with LVAD hum  present. Lungs: clear Abdomen: soft, nontender, nondistended. No hepatosplenomegaly. No bruits or masses. Good bowel sounds. Extremities: Warm, no cyanosis, clubbing, rash, edema Neuro: alert & orientedx3, cranial nerves grossly intact. moves all 4 extremities w/o difficulty. Affect pleasant  Telemetry: Sinus rhythm 94  Labs: Basic Metabolic Panel: Recent Labs  Lab 05/15/17 1552  05/16/17 0529 05/16/17 1654  05/17/17 0428 05/17/17 1605 05/18/17 0346 05/18/17 1703 05/19/17 0325  NA 138   < > 139  --    < > 136 135 134* 133* 135  K 4.0   < > 4.9  --    < > 4.4 4.0 3.8 3.9 3.5  CL 106   < > 105  --    < > 103 98* 98* 94* 97*  CO2 26  --  24  --   --  24  --  28  --  30  GLUCOSE 124*   < > 137*  --    < > 147* 116* 108* 105* 87  BUN 30*   < > 26*  --    < > 22* 22* 22* 21* 25*  CREATININE 1.36*   < > 1.27* 1.49*   < > 1.36* 1.30* 1.26* 0.90 1.13  CALCIUM 8.3*  --  8.6*  --   --  8.8*  --  8.9  --  8.9  MG 2.2   < > 2.5* 2.1  --  1.9  --  1.7  --  1.7  PHOS  --   --  3.7  --   --  3.5  --  3.5  --  3.8   < > = values in this interval not displayed.    Liver Function Tests: Recent Labs  Lab 05/16/17 0529 05/17/17 0428 05/18/17 0346 05/19/17 0325  AST 59* 67* 39 26  ALT 12* 16* 15* 13*  ALKPHOS 48 60 59 64  BILITOT 3.2* 3.2* 3.0* 2.2*  PROT 6.4* 6.7 6.5 6.6  ALBUMIN 3.1* 2.9* 2.6* 2.5*   No results for input(s): LIPASE, AMYLASE in the last 168 hours. No results for input(s): AMMONIA in the last 168 hours.  CBC: Recent Labs  Lab 05/16/17 0529 05/16/17 1654  05/17/17 0428 05/17/17 1605 05/18/17 0346 05/18/17 1703 05/19/17 0325  WBC 11.1* 16.5*  --  16.1*  --  15.5*  --  11.7*  NEUTROABS 9.7*  --   --  12.6*  --  14.1*  --  9.1*  HGB 9.7* 9.9*   < > 9.6* 9.2* 8.9* 8.2* 8.9*  HCT 30.4* 30.6*   < > 29.6* 27.0* 27.6* 24.0* 27.2*  MCV 86.1 85.5  --  85.3  --  85.4  --  85.3  PLT 168 160  --  153  --  153  --  186   < > = values in this interval not displayed.     INR: Recent Labs  Lab 05/15/17 2130 05/16/17 0529 05/17/17 0428 05/18/17 0346 05/19/17 0325  INR 0.99 1.24 1.68 2.19 2.29    Other results:  EKG:   Imaging: Dg Chest Port 1 View  Result Date: 05/19/2017 CLINICAL DATA:  LVAD. EXAM: PORTABLE CHEST 1 VIEW COMPARISON:  Chest x-ray from yesterday. FINDINGS: Unchanged left internal jugular central venous catheter, right PICC line, left chest tube, and LVAD device. Stable cardiomegaly. Normal pulmonary vascularity. Unchanged small left pleural effusion and left basilar atelectasis. Improved aeration at the right lung base. Small right pleural effusion. IMPRESSION: 1. Improved aeration at the right lung base. Unchanged small bilateral pleural effusions and left basilar atelectasis. Electronically Signed   By: Obie Dredge M.D.   On: 05/19/2017 07:31   Dg Chest Port 1 View  Result Date: 05/18/2017 CLINICAL DATA:  Chest tube, LVAD remains, shortness of breath EXAM: PORTABLE CHEST 1 VIEW COMPARISON:  Portable chest x-ray of 05/17/2017 FINDINGS: There is little change in poor aeration with pulmonary vascular congestion and possible small effusions right greater than left. Cardiomegaly is stable. Swan-Ganz catheter has been removed and a left central venous line remains with the tip overlying the mid SVC. Right PICC line extends to a point overlying the SVC-RA junction. Median sternotomy sutures are noted. Left chest tube is unchanged and LVAD remains. IMPRESSION: 1. Swan-Ganz catheter removed. 2. Little change in poor aeration, pulmonary vascular congestion, and probable small effusions right greater than left with cardiomegaly. Electronically Signed   By: Dwyane Dee M.D.   On: 05/18/2017 09:17     Medications:     Scheduled Medications: . acetaminophen  1,000 mg Oral Q6H   Or  . acetaminophen (TYLENOL) oral liquid 160 mg/5 mL  1,000 mg Per Tube Q6H  . allopurinol  100 mg Oral Daily  . amiodarone  200 mg Oral BID  . aspirin EC  81 mg  Oral Daily  . bisacodyl  10 mg Oral Daily   Or  . bisacodyl  10 mg Rectal Daily  . chlorhexidine  15 mL Mouth Rinse BID  . Chlorhexidine Gluconate Cloth  6 each Topical Daily  . clonazePAM  1 mg Oral QHS  . docusate sodium  200 mg Oral  Daily  . feeding supplement (PRO-STAT SUGAR FREE 64)  30 mL Oral BID  . furosemide  40 mg Intravenous BID  . guaiFENesin  600 mg Oral BID  . insulin aspart  0-15 Units Subcutaneous TID WC  . insulin aspart  0-5 Units Subcutaneous QHS  . lactose free nutrition  237 mL Oral TID WC  . levalbuterol  1.25 mg Nebulization QID  . mouth rinse  15 mL Mouth Rinse q12n4p  . metoCLOPramide (REGLAN) injection  10 mg Intravenous Q6H  . pantoprazole  40 mg Oral Daily  . potassium chloride  40 mEq Oral BID  . sodium chloride flush  10-40 mL Intracatheter Q12H  . warfarin  3 mg Oral q1800  . Warfarin - Physician Dosing Inpatient   Does not apply q1800    Infusions: . sodium chloride    . lactated ringers 10 mL/hr at 05/19/17 0400  . milrinone 0.25 mcg/kg/min (05/19/17 0700)  . potassium chloride Stopped (05/19/17 0848)  . potassium chloride    . vancomycin      PRN Medications: hydrALAZINE, morphine injection, ondansetron (ZOFRAN) IV, oxyCODONE, sodium chloride flush, traMADol   Assessment:  47 year old with nonischemic cardiomyopathy EF 10% admitted with cardiogenic shock and acute on chronic systolic heart failure  HeartMate 3 implanted April 30, preop balloon pump removed  Large LV thrombus removed intact intraoperatively Postoperative bleeding from gumline at prior dental extraction site controlled with topical hemostatic agent and product administration  Patient with moderate RV dysfunction requiring low-dose inotropic support - now off epinephrine and inhaled nitric oxide. Milrinone at 0.25. C0-0x 60 %   Plan/Discussion:   Pt progressing- ambulate in hall when off nitric oxide INR > 2.0- reduce ASA to 81 mg Decrease lasix to 40 bid Remove L  pleural chest tube today and L neck line Keep pump speed at 5400 rpm, milrinone at .25 Coumadin 3 mg Q HS I reviewed the LVAD parameters from today, and compared the results to the patient's prior recorded data.  No programming changes were made.  The LVAD is functioning within specified parameters.  The patient performs LVAD self-test daily.  LVAD interrogation was negative for any significant power changes, alarms or PI events/speed drops.  LVAD equipment check completed and is in good working order.  Back-up equipment present.   LVAD education done on emergency procedures and precautions and reviewed exit site care.  Length of Stay: [address]  Kathlee Nations [place] III 05/19/2017, 8:23 AM

## 2017-05-19 NOTE — Plan of Care (Signed)
Pt consulted to PT/OT services. Pt getting OOB tgo chair with assistence 3 times per day. Beginning to progress ambulation as tolerated.  Pt maintaining adequate MAPs 60-90's with pressors weaned to off. Pt remains on 0.25 mcg Milrinone per orders. Co-ox results improved to 60 this A.M.  Pt with O2 sats 98-100 % on room air and regularly performs IS/FV without  prompting. Lungs sounds clear in all fields.

## 2017-05-20 ENCOUNTER — Inpatient Hospital Stay (HOSPITAL_COMMUNITY): Payer: Medicaid Other

## 2017-05-20 LAB — COMPREHENSIVE METABOLIC PANEL
ALT: 14 U/L — ABNORMAL LOW (ref 17–63)
AST: 25 U/L (ref 15–41)
Albumin: 2.3 g/dL — ABNORMAL LOW (ref 3.5–5.0)
Alkaline Phosphatase: 80 U/L (ref 38–126)
Anion gap: 8 (ref 5–15)
BUN: 25 mg/dL — ABNORMAL HIGH (ref 6–20)
CO2: 27 mmol/L (ref 22–32)
Calcium: 8.5 mg/dL — ABNORMAL LOW (ref 8.9–10.3)
Chloride: 100 mmol/L — ABNORMAL LOW (ref 101–111)
Creatinine, Ser: 1.21 mg/dL (ref 0.61–1.24)
GFR calc Af Amer: >60 mL/min (ref >60)
GFR calc non Af Amer: >60 mL/min (ref >60)
Glucose, Bld: 107 mg/dL — ABNORMAL HIGH (ref 65–99)
Potassium: 3.9 mmol/L (ref 3.5–5.1)
Sodium: 135 mmol/L (ref 135–145)
Total Bilirubin: 1.6 mg/dL — ABNORMAL HIGH (ref 0.3–1.2)
Total Protein: 6.5 g/dL (ref 6.5–8.1)

## 2017-05-20 LAB — GLUCOSE, CAPILLARY
Glucose-Capillary: 119 mg/dL — ABNORMAL HIGH (ref 65–99)
Glucose-Capillary: 183 mg/dL — ABNORMAL HIGH (ref 65–99)
Glucose-Capillary: 172 mg/dL — ABNORMAL HIGH (ref 65–99)
Glucose-Capillary: 123 mg/dL — ABNORMAL HIGH (ref 65–99)

## 2017-05-20 LAB — COOXEMETRY PANEL
Carboxyhemoglobin: 1.3 % (ref 0.5–1.5)
Methemoglobin: 1.5 % (ref 0.0–1.5)
O2 Saturation: 61.3 %
Total hemoglobin: 9 g/dL — ABNORMAL LOW (ref 12.0–16.0)

## 2017-05-20 LAB — CBC WITH DIFFERENTIAL/PLATELET
Basophils Absolute: 0 10*3/uL (ref 0.0–0.1)
Basophils Relative: 0 %
Eosinophils Absolute: 0.1 10*3/uL (ref 0.0–0.7)
Eosinophils Relative: 1 %
HCT: 25.8 % — ABNORMAL LOW (ref 39.0–52.0)
Hemoglobin: 8.3 g/dL — ABNORMAL LOW (ref 13.0–17.0)
Lymphocytes Relative: 11 %
Lymphs Abs: 1 10*3/uL (ref 0.7–4.0)
MCH: 27.2 pg (ref 26.0–34.0)
MCHC: 32.2 g/dL (ref 30.0–36.0)
MCV: 84.6 fL (ref 78.0–100.0)
Monocytes Absolute: 1.6 10*3/uL — ABNORMAL HIGH (ref 0.1–1.0)
Monocytes Relative: 17 %
Neutro Abs: 6.7 10*3/uL (ref 1.7–7.7)
Neutrophils Relative %: 71 %
Platelets: 213 10*3/uL (ref 150–400)
RBC: 3.05 MIL/uL — ABNORMAL LOW (ref 4.22–5.81)
RDW: 15.9 % — ABNORMAL HIGH (ref 11.5–15.5)
WBC: 9.4 10*3/uL (ref 4.0–10.5)

## 2017-05-20 LAB — PROTIME-INR
INR: 2.12
Prothrombin Time: 23.6 seconds — ABNORMAL HIGH (ref 11.4–15.2)

## 2017-05-20 LAB — MAGNESIUM: Magnesium: 2.1 mg/dL (ref 1.7–2.4)

## 2017-05-20 LAB — LACTATE DEHYDROGENASE: LDH: 244 U/L — ABNORMAL HIGH (ref 98–192)

## 2017-05-20 MED ORDER — WARFARIN SODIUM 2 MG PO TABS
4.0000 mg | ORAL_TABLET | Freq: Every day | ORAL | Status: AC
  Administered 2017-05-20: 4 mg via ORAL
  Filled 2017-05-20: qty 2

## 2017-05-20 MED ORDER — FUROSEMIDE 40 MG PO TABS
40.0000 mg | ORAL_TABLET | Freq: Two times a day (BID) | ORAL | Status: DC
  Administered 2017-05-20 – 2017-05-29 (×20): 40 mg via ORAL
  Filled 2017-05-20 (×20): qty 1

## 2017-05-20 MED ORDER — METOLAZONE 5 MG PO TABS
5.0000 mg | ORAL_TABLET | Freq: Once | ORAL | Status: AC
  Administered 2017-05-20: 5 mg via ORAL
  Filled 2017-05-20: qty 1

## 2017-05-20 MED ORDER — WARFARIN - PHARMACIST DOSING INPATIENT
Freq: Every day | Status: DC
  Administered 2017-05-20: 1
  Administered 2017-05-26 – 2017-06-03 (×8)

## 2017-05-20 MED ORDER — LEVALBUTEROL HCL 1.25 MG/0.5ML IN NEBU: 1.2500 mg | INHALATION_SOLUTION | RESPIRATORY_TRACT | Status: DC | PRN

## 2017-05-20 NOTE — Progress Notes (Signed)
Patient ID: Benjamin Sherman, male   DOB: [birth year], 47 y.o.   MRN: [id number]    Advanced Heart Failure Rounding Note  Subjective:    Events:  -Admitted 04/21/17 with cardiogenic shock and atrial flutter.  -Central line placed. Initial co-ox 39%. -IABP placed 04/25/17-> 04/30/17 with persistent cardiogenic shock.  -DCCV atrial flutter 4/12.   -S/p multiple teeth extractions 05/11/17.  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  -Extubated on 5/1   Remains on milrinone 0.25 mcg/kg/min.  Co-ox 61% today. CVP 8-9.   No dyspnea.  Walked around the unit yesterday.  Says he feels fine.   HMIII Pump Speed 5400, Flow 3.9, PI 4.6  and Power 3.9. No PI events    Objective:   Weight Range:  Vital Signs:   Temp:  [97.3 F (36.3 C)-98.4 F (36.9 C)] 97.7 F (36.5 C) (05/05 0747) Pulse Rate:  [29-99] 29 (05/05 0600) Resp:  [13-29] 13 (05/05 0700) SpO2:  [97 %-100 %] 97 % (05/05 0600) Weight:  [207 lb 0.2 oz (93.9 kg)] 207 lb 0.2 oz (93.9 kg) (05/05 0700) Last BM Date: (Prior to surgery)  Weight change: Filed Weights   05/18/17 0500 05/19/17 0600 05/20/17 0700  Weight: 208 lb 12.4 oz (94.7 kg) 205 lb 11 oz (93.3 kg) 207 lb 0.2 oz (93.9 kg)   Intake/Output:   Intake/Output Summary (Last 24 hours) at 05/20/2017 0930 Last data filed at 05/20/2017 0757 Gross per 24 hour  Intake 1946.2 ml  Output 1720 ml  Net 226.2 ml    Physical Exam  CVP 8-9  Physical Exam: GENERAL: Well appearing this am. NAD.  HEENT: Normal. NECK: Supple, JVP 7-8 cm. Carotids OK.  CARDIAC:  Mechanical heart sounds with LVAD hum present.  LUNGS:  CTAB, normal effort.  ABDOMEN:  NT, ND, no HSM. No bruits or masses. +BS  LVAD exit site: Well-healed and incorporated. Dressing dry and intact. No erythema or drainage. Stabilization device present and accurately applied. Driveline dressing changed daily per sterile technique. EXTREMITIES:  Warm and dry. No cyanosis, clubbing, rash, or edema.  NEUROLOGIC:  Alert & oriented x 3. Cranial nerves  grossly intact. Moves all 4 extremities w/o difficulty. Affect pleasant     Telemetry    NSR 90s personally reviewed.   EKG   No new tracings.   Labs    Basic Metabolic Panel: Recent Labs  Lab 05/16/17 0529 05/16/17 1654  05/17/17 0428 05/17/17 1605 05/18/17 0346 05/18/17 1703 05/19/17 0325 05/20/17 0501  NA 139  --    < > 136 135 134* 133* 135 135  K 4.9  --    < > 4.4 4.0 3.8 3.9 3.5 3.9  CL 105  --    < > 103 98* 98* 94* 97* 100*  CO2 24  --   --  24  --  28  --  30 27  GLUCOSE 137*  --    < > 147* 116* 108* 105* 87 107*  BUN 26*  --    < > 22* 22* 22* 21* 25* 25*  CREATININE 1.27* 1.49*   < > 1.36* 1.30* 1.26* 0.90 1.13 1.21  CALCIUM 8.6*  --   --  8.8*  --  8.9  --  8.9 8.5*  MG 2.5* 2.1  --  1.9  --  1.7  --  1.7 2.1  PHOS 3.7  --   --  3.5  --  3.5  --  3.8  --    < > = values in  this interval not displayed.   Liver Function Tests: Recent Labs  Lab 05/16/17 0529 05/17/17 0428 05/18/17 0346 05/19/17 0325 05/20/17 0501  AST 59* 67* 39 26 25  ALT 12* 16* 15* 13* 14*  ALKPHOS 48 60 59 64 80  BILITOT 3.2* 3.2* 3.0* 2.2* 1.6*  PROT 6.4* 6.7 6.5 6.6 6.5  ALBUMIN 3.1* 2.9* 2.6* 2.5* 2.3*   No results for input(s): LIPASE, AMYLASE in the last 168 hours. No results for input(s): AMMONIA in the last 168 hours.  CBC: Recent Labs  Lab 05/16/17 0529 05/16/17 1654  05/17/17 0428 05/17/17 1605 05/18/17 0346 05/18/17 1703 05/19/17 0325 05/20/17 0501  WBC 11.1* 16.5*  --  16.1*  --  15.5*  --  11.7* 9.4  NEUTROABS 9.7*  --   --  12.6*  --  14.1*  --  9.1* 6.7  HGB 9.7* 9.9*   < > 9.6* 9.2* 8.9* 8.2* 8.9* 8.3*  HCT 30.4* 30.6*   < > 29.6* 27.0* 27.6* 24.0* 27.2* 25.8*  MCV 86.1 85.5  --  85.3  --  85.4  --  85.3 84.6  PLT 168 160  --  153  --  153  --  186 213   < > = values in this interval not displayed.   Cardiac Enzymes: No results for input(s): CKTOTAL, CKMB, CKMBINDEX, TROPONINI in the last 168 hours. BNP: BNP (last 3 results) Recent Labs     03/25/17 1401 04/21/17 1212 05/16/17 0529  BNP 835.4* 483.0* 262.3*   ProBNP (last 3 results) No results for input(s): PROBNP in the last 8760 hours.  Other results:  Imaging: Dg Chest Port 1 View  Result Date: 05/20/2017 CLINICAL DATA:  LVAD EXAM: PORTABLE CHEST 1 VIEW COMPARISON:  Chest radiograph from one day prior. FINDINGS: Right PICC terminates over the right atrium. Intact sternotomy wires. Stable configuration of visualized portion of left ventricular assist device overlying the left diaphragm. Stable cardiomediastinal silhouette with mild to moderate cardiomegaly. No pneumothorax. Stable small bilateral pleural effusions. No pulmonary edema. Stable mild bibasilar atelectasis. IMPRESSION: 1. Stable cardiomegaly without pulmonary edema. 2. Stable small bilateral pleural effusions and mild bibasilar atelectasis. Electronically Signed   By: Delbert Phenix M.D.   On: 05/20/2017 07:33   Dg Chest Port 1 View  Result Date: 05/19/2017 CLINICAL DATA:  LVAD. EXAM: PORTABLE CHEST 1 VIEW COMPARISON:  Chest x-ray from yesterday. FINDINGS: Unchanged left internal jugular central venous catheter, right PICC line, left chest tube, and LVAD device. Stable cardiomegaly. Normal pulmonary vascularity. Unchanged small left pleural effusion and left basilar atelectasis. Improved aeration at the right lung base. Small right pleural effusion. IMPRESSION: 1. Improved aeration at the right lung base. Unchanged small bilateral pleural effusions and left basilar atelectasis. Electronically Signed   By: Obie Dredge M.D.   On: 05/19/2017 07:31    Medications:    Scheduled Medications: . acetaminophen  1,000 mg Oral Q6H   Or  . acetaminophen (TYLENOL) oral liquid 160 mg/5 mL  1,000 mg Per Tube Q6H  . allopurinol  100 mg Oral Daily  . amiodarone  200 mg Oral BID  . aspirin EC  81 mg Oral Daily  . bisacodyl  10 mg Oral Daily   Or  . bisacodyl  10 mg Rectal Daily  . chlorhexidine  15 mL Mouth Rinse BID  .  Chlorhexidine Gluconate Cloth  6 each Topical Daily  . clonazePAM  1 mg Oral QHS  . docusate sodium  200 mg Oral Daily  .  feeding supplement (PRO-STAT SUGAR FREE 64)  30 mL Oral BID  . furosemide  40 mg Oral BID  . guaiFENesin  600 mg Oral BID  . insulin aspart  0-15 Units Subcutaneous TID WC  . insulin aspart  0-5 Units Subcutaneous QHS  . lactose free nutrition  237 mL Oral TID WC  . mouth rinse  15 mL Mouth Rinse q12n4p  . metoCLOPramide (REGLAN) injection  10 mg Intravenous Q6H  . metolazone  5 mg Oral Once  . pantoprazole  40 mg Oral Daily  . potassium chloride  40 mEq Oral BID  . sodium chloride flush  10-40 mL Intracatheter Q12H  . warfarin  4 mg Oral q1800  . Warfarin - Pharmacist Dosing Inpatient   Does not apply q1800    Infusions: . sodium chloride    . lactated ringers 10 mL/hr at 05/20/17 0700  . milrinone 0.25 mcg/kg/min (05/20/17 0700)    PRN Medications:    Assessment:   Benjamin Sherman is a 47 y.o. 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 eosinophilic cardiomyopathy, LV thrombus, hypothyroidism, DM2, CKD Stage II-III and traumatic [facility] 11/18 after fall.   Admitted with cardiogenic shock.  Plan/Discussion:    1. S/P HMIII 4/30 under DT.  Off all drips except milrinone 0.25 mcg. Co-ox 61% today with CVP 8-9. LDH 244.  No further PI events since early yesterday am.  - Continue ASA 81 daily.  - warfarin for goal INR 2-2.5 => 2.12 today.   2. Acute on chronic systolic HF -> cardiogenic shock:  Nonischemic cardiomyopathy. ECHO 11/2016 EF 25-30% CMRI EF 22%. Bedside echo in HF clinic EF 20% in 3/19.  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 Did not respond to steroids. Admitted 4/6 with cardiogenic shock and volume overload co-ox 38%. Echo 04/23/17: LVEF 20-25% with large LV clot, RV mild to moderately down.  Co-ox 61%, CVP 8-9.  - Transition to Lasix 40 mg po bid today.  - Decrease milrinone to 0.125.  3. Atrial flutter:  New onset. S/p DCCV to NSR on 4/12.  - Maintaining NSR on oral amiodarone.  4. AKI CKD Stage II-III: Likely ATN due to shock.  Resolved.  5. H/o LV Thrombus - now recurrent on echo 04/23/17 6. DMII - Per TCTS.  7. Hypothyroidism: TFTs OK 04/22/17. TSH elevated at 10.848 4/23. T4 and T3 normal. No change 8. RLE erythema/cellulitis: Started doxy 4/20. Resolved.   Continue to walk in halls.   Benjamin Ancona, MD  05/20/2017 9:30 AM   Advanced Heart Failure Team Pager [phone] (M-F; 7a - 4p)  Please contact [facility] for night-coverage after hours (4p -7a ) and weekends on [email or web address]

## 2017-05-20 NOTE — Progress Notes (Signed)
ANTICOAGULATION CONSULT NOTE - Initial Consult  Pharmacy Consult for Coumadin Indication: LVAD  Allergies  Allergen Reactions  . Bee Venom     UNSPECIFIED REACTION     Patient Measurements: Height: 6' (182.9 cm) Weight: 207 lb 0.2 oz (93.9 kg) IBW/kg (Calculated) : 77.6 Heparin Dosing Weight: n/a  Vital Signs: Temp: 97.7 F (36.5 C) (05/05 0747) Temp Source: Oral (05/05 0747) Pulse Rate: 29 (05/05 0600)  Labs: Recent Labs    05/18/17 0346 05/18/17 1703 05/19/17 0325 05/20/17 0501  HGB 8.9* 8.2* 8.9* 8.3*  HCT 27.6* 24.0* 27.2* 25.8*  PLT 153  --  186 213  LABPROT 24.2*  --  25.0* 23.6*  INR 2.19  --  2.29 2.12  CREATININE 1.26* 0.90 1.13 1.21    Estimated Creatinine Clearance: 88.8 mL/min (by C-G formula based on SCr of 1.21 mg/dL).   Medical History: Past Medical History:  Diagnosis Date  . CHF (congestive heart failure) (HCC)   . Diabetes mellitus without complication (HCC)     Medications:  Scheduled:  . acetaminophen  1,000 mg Oral Q6H   Or  . acetaminophen (TYLENOL) oral liquid 160 mg/5 mL  1,000 mg Per Tube Q6H  . allopurinol  100 mg Oral Daily  . amiodarone  200 mg Oral BID  . aspirin EC  81 mg Oral Daily  . bisacodyl  10 mg Oral Daily   Or  . bisacodyl  10 mg Rectal Daily  . chlorhexidine  15 mL Mouth Rinse BID  . Chlorhexidine Gluconate Cloth  6 each Topical Daily  . clonazePAM  1 mg Oral QHS  . docusate sodium  200 mg Oral Daily  . feeding supplement (PRO-STAT SUGAR FREE 64)  30 mL Oral BID  . furosemide  40 mg Intravenous BID  . guaiFENesin  600 mg Oral BID  . insulin aspart  0-15 Units Subcutaneous TID WC  . insulin aspart  0-5 Units Subcutaneous QHS  . lactose free nutrition  237 mL Oral TID WC  . levalbuterol  1.25 mg Nebulization QID  . mouth rinse  15 mL Mouth Rinse q12n4p  . metoCLOPramide (REGLAN) injection  10 mg Intravenous Q6H  . metolazone  5 mg Oral Once  . pantoprazole  40 mg Oral Daily  . potassium chloride  40 mEq  Oral BID  . sodium chloride flush  10-40 mL Intracatheter Q12H  . warfarin  4 mg Oral q1800  . Warfarin - Pharmacist Dosing Inpatient   Does not apply q1800    Assessment: 47 yo male s/p LVAD placement 4/30.  INR now over 2 after several doses of Coumadin.  Pharmacy asked to begin dosing today.  Goal of Therapy:  INR goal 2-2.5 Monitor platelets by anticoagulation protocol: Yes   Plan:  Coumadin 4 mg daily for now. Daily INR.  Tad Moore, BCPS  Clinical Pharmacist Pager [phone]  05/20/2017 8:18 AM

## 2017-05-21 LAB — GLUCOSE, CAPILLARY
Glucose-Capillary: 148 mg/dL — ABNORMAL HIGH (ref 65–99)
Glucose-Capillary: 204 mg/dL — ABNORMAL HIGH (ref 65–99)
Glucose-Capillary: 200 mg/dL — ABNORMAL HIGH (ref 65–99)
Glucose-Capillary: 204 mg/dL — ABNORMAL HIGH (ref 65–99)
Glucose-Capillary: 166 mg/dL — ABNORMAL HIGH (ref 65–99)
Glucose-Capillary: 154 mg/dL — ABNORMAL HIGH (ref 65–99)

## 2017-05-21 LAB — COMPREHENSIVE METABOLIC PANEL
ALT: 16 U/L — ABNORMAL LOW (ref 17–63)
AST: 27 U/L (ref 15–41)
Albumin: 2.4 g/dL — ABNORMAL LOW (ref 3.5–5.0)
Alkaline Phosphatase: 106 U/L (ref 38–126)
Anion gap: 11 (ref 5–15)
BUN: 22 mg/dL — ABNORMAL HIGH (ref 6–20)
CO2: 27 mmol/L (ref 22–32)
Calcium: 9 mg/dL (ref 8.9–10.3)
Chloride: 97 mmol/L — ABNORMAL LOW (ref 101–111)
Creatinine, Ser: 1.1 mg/dL (ref 0.61–1.24)
GFR calc Af Amer: >60 mL/min (ref >60)
GFR calc non Af Amer: >60 mL/min (ref >60)
Glucose, Bld: 122 mg/dL — ABNORMAL HIGH (ref 65–99)
Potassium: 4.2 mmol/L (ref 3.5–5.1)
Sodium: 135 mmol/L (ref 135–145)
Total Bilirubin: 1.4 mg/dL — ABNORMAL HIGH (ref 0.3–1.2)
Total Protein: 7.6 g/dL (ref 6.5–8.1)

## 2017-05-21 LAB — CBC WITH DIFFERENTIAL/PLATELET
Basophils Absolute: 0 10*3/uL (ref 0.0–0.1)
Basophils Relative: 0 %
Eosinophils Absolute: 0.1 10*3/uL (ref 0.0–0.7)
Eosinophils Relative: 1 %
HCT: 28.7 % — ABNORMAL LOW (ref 39.0–52.0)
Hemoglobin: 9.6 g/dL — ABNORMAL LOW (ref 13.0–17.0)
Lymphocytes Relative: 14 %
Lymphs Abs: 1.7 10*3/uL (ref 0.7–4.0)
MCH: 28.2 pg (ref 26.0–34.0)
MCHC: 33.4 g/dL (ref 30.0–36.0)
MCV: 84.4 fL (ref 78.0–100.0)
Monocytes Absolute: 1.6 10*3/uL — ABNORMAL HIGH (ref 0.1–1.0)
Monocytes Relative: 13 %
Neutro Abs: 8.9 10*3/uL — ABNORMAL HIGH (ref 1.7–7.7)
Neutrophils Relative %: 72 %
Platelets: 315 10*3/uL (ref 150–400)
RBC: 3.4 MIL/uL — ABNORMAL LOW (ref 4.22–5.81)
RDW: 15.8 % — ABNORMAL HIGH (ref 11.5–15.5)
WBC: 12.3 10*3/uL — ABNORMAL HIGH (ref 4.0–10.5)

## 2017-05-21 LAB — COOXEMETRY PANEL
Carboxyhemoglobin: 1.6 % — ABNORMAL HIGH (ref 0.5–1.5)
Methemoglobin: 0.8 % (ref 0.0–1.5)
O2 Saturation: 57.3 %
Total hemoglobin: 10 g/dL — ABNORMAL LOW (ref 12.0–16.0)

## 2017-05-21 LAB — MAGNESIUM: Magnesium: 1.8 mg/dL (ref 1.7–2.4)

## 2017-05-21 LAB — PROTIME-INR
INR: 1.96
Prothrombin Time: 22.2 seconds — ABNORMAL HIGH (ref 11.4–15.2)

## 2017-05-21 LAB — LACTATE DEHYDROGENASE: LDH: 265 U/L — ABNORMAL HIGH (ref 98–192)

## 2017-05-21 MED ORDER — WARFARIN SODIUM 5 MG PO TABS
5.0000 mg | ORAL_TABLET | Freq: Once | ORAL | Status: AC
  Administered 2017-05-21: 5 mg via ORAL
  Filled 2017-05-21: qty 1

## 2017-05-21 MED ORDER — MAGNESIUM SULFATE 2 GM/50ML IV SOLN
2.0000 g | Freq: Once | INTRAVENOUS | Status: AC
  Administered 2017-05-21: 2 g via INTRAVENOUS
  Filled 2017-05-21: qty 50

## 2017-05-21 NOTE — Progress Notes (Signed)
LVAD Coordinator Rounding Note:  Admitted 04/21/17 with cardiogenic shock and atrial flutter.   HM III LVAD implanted on 05/15/17 by Dr. Maren Beach  under Destination Therapy criteria due to limited social support.   Pt sitting up in the chair this morning, dozing.  Vital signs: Temp:  99.6 HR: 105 Auto BP: not done Doppler: 84 O2 Sat: 100 % on RA Wt: 195>211>212>208>205>207>200 lbs   LVAD interrogation reveals:  Speed: 5400 Flow: 4.3 Power:  4.0w PI: 3.7 Alarms: none Events: 18 PI events Hematocrit: 29 Fixed speed: 5400 Low speed limit: 5100  Labs:  LDH trend: 253>317>287>263>244>265  INR trend: 1.24>1.68>2.19>2.29>2.12>1.96  Anticoagulation Plan: -INR Goal: 2.0 - 2.5  -ASA Dose: 81 mg (started 05/18/17 with therapeutic INR)  Blood Products:   Intra op: 05/15/17 - 1 unit plts - 1 unit FFP  Post op:  - 05/15/17>1 RBC, 2 FFP, DDAVP, Factor VII -05/16/17>1 PC  Device: N/A  Arrythmias: increased ectopy, short run Afib 05/15/17 - Amiodarone started 30 mg/hr-switched to PO today  Respiratory: extubated 05/16/17  Nitric Oxide: off 05/16/17  Gtts: Milrinone> .125 mcg/kg/min Levo>8 mcg/min-off 5/2 Epi>1 mcg/min-off 5/3 Amiodarone>30 mg/hr-off 5/2 Lasix 6 mg/hr-off 5/3  Adverse Events on VAD: -none  VAD Education: Plan to meet with sister and best friend today to begin dressing change education. HM III Handbook given to patient and asked him to read.  Plan/Recommendations:   1. Daily drive line dressing changes per VAD Coordinator today with caregivers x 2. 2. Call VAD pager if any questions re: VAD equipment or drive line site/care.  [name] RN, VAD Coordinator 24/7 VAD pager: [phone]

## 2017-05-21 NOTE — Progress Notes (Signed)
Occupational Therapy Treatment Patient Details Name: Benjamin Sherman MRN: [id number] DOB: September 15, 1970 Today's Date: 05/21/2017    History of present illness 47 yo admitted with cardiogenic shock, s/p RHC and IABP insertion with continued shock 4/29 , teeth extraction 4/26, HM111 4/30, extubated 5/1. PMHx: HF, cardiomyopathy, LV thrombus, hypothyroidism, DM, CKD, traumatic [facility] 11/2016   OT comments  This 47 yo male admitted and underwent above presents to acute OT making progress with sit<>stand, LB ADLs. And recall of precautions, what to have in black bag, and how to change to<>from wall/battery power.   Follow Up Recommendations  No OT follow up;Supervision/Assistance - 24 hour    Equipment Recommendations  3 in 1 bedside commode       Precautions / Restrictions Precautions Precautions: Sternal Precaution Comments: LVAD Restrictions Weight Bearing Restrictions: Yes Other Position/Activity Restrictions: sternal       Mobility Bed Mobility               General bed mobility comments: in chair on arrival  Transfers Overall transfer level: Needs assistance   Transfers: Sit to/from Stand Sit to Stand: Min guard         General transfer comment: pt did not recall to scoot to front of recliner prior to sit to stand so when he stood he was min guard A for balance, he did recall to put his arms on thighs without cues    Balance Overall balance assessment: Needs assistance   Sitting balance-Leahy Scale: Good       Standing balance-Leahy Scale: Fair                             ADL either performed or assessed with clinical judgement   ADL Overall ADL's : Needs assistance/impaired                       Lower Body Dressing Details (indicate cue type and reason): Pt can cross his legs to get to his feet for socks and shoes, with minguard A sit<>stand without use of arms                               Cognition Arousal/Alertness:  Awake/alert Behavior During Therapy: WFL for tasks assessed/performed Overall Cognitive Status: Impaired/Different from baseline Area of Impairment: Problem Solving                           Problem Solving: Slow processing(slow to answering questions at times) General Comments: Pt was able to tell me all the items he needed to have in his black bag, he was able to recall all sternal precautions, he was able to transtion from<>to battery and wall power without cues with increased time with one out of the 4 connections                   Pertinent Vitals/ Pain       Pain Assessment: No/denies pain     Prior Functioning/Environment              Frequency  Min 2X/week        Progress Toward Goals  OT Goals(current goals can now be found in the care plan section)  Progress towards OT goals: Progressing toward goals     Plan Discharge plan remains appropriate       AM-PAC  PT "6 Clicks" Daily Activity     Outcome Measure   Help from another person eating meals?: None Help from another person taking care of personal grooming?: A Little Help from another person toileting, which includes using toliet, bedpan, or urinal?: A Little Help from another person bathing (including washing, rinsing, drying)?: A Little Help from another person to put on and taking off regular upper body clothing?: A Little Help from another person to put on and taking off regular lower body clothing?: A Little 6 Click Score: 19    End of Session    OT Visit Diagnosis: Unsteadiness on feet (R26.81);Other (comment)   Activity Tolerance Patient tolerated treatment well   Patient Left in chair;with call bell/phone within reach   Nurse Communication          Time: 6045-4098 OT Time Calculation (min): 19 min  Charges: OT General Charges $OT Visit: 1 Visit OT Treatments $Self Care/Home Management : 8-22 mins Ignacia Palma, OTR/L [phone] 05/21/2017

## 2017-05-21 NOTE — Progress Notes (Addendum)
Patient ID: Benjamin Sherman, male   DOB: 1970/08/28, 47 y.o.   MRN: [id number]    Advanced Heart Failure Rounding Note  Subjective:    Events:  -Admitted 04/21/17 with cardiogenic shock and atrial flutter.  -Central line placed. Initial co-ox 39%. -IABP placed 04/25/17-> 04/30/17 with persistent cardiogenic shock.  -DCCV atrial flutter 4/12.   -S/p multiple teeth extractions 05/11/17.  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  -Extubated on 5/1   Coox 57.3% on milrinone 0.125 mcg/kg/min. CVP 4-5  Feeling OK this am, just tired. Able to walk halls with assistance. No lightheadedness or dizziness while walking halls. Pain well controlled. Weight still about 5 pounds from baseline. Rhythm stable. INR 1.96  LVAD Interrogation HM 3: Speed: 5400 Flow: 3.8 PI: 5.6 Power: 4.0. No PI events  Objective:   Weight Range:  Vital Signs:   Temp:  [97.9 F (36.6 C)-98.6 F (37 C)] 97.9 F (36.6 C) (05/06 0748) Pulse Rate:  [93-100] 97 (05/06 0700) Resp:  [4-31] 19 (05/06 0700) SpO2:  [96 %-100 %] 100 % (05/06 0700) Weight:  [200 lb 6.4 oz (90.9 kg)] 200 lb 6.4 oz (90.9 kg) (05/06 0700) Last BM Date: (Prior to surgery)  Weight change: Filed Weights   05/19/17 0600 05/20/17 0700 05/21/17 0700  Weight: 205 lb 11 oz (93.3 kg) 207 lb 0.2 oz (93.9 kg) 200 lb 6.4 oz (90.9 kg)   Intake/Output:   Intake/Output Summary (Last 24 hours) at 05/21/2017 0835 Last data filed at 05/21/2017 0700 Gross per 24 hour  Intake 1530.02 ml  Output 4180 ml  Net -2649.98 ml    Physical Exam  CVP 4-5  Physical Exam: GENERAL: Well appearing this am. NAD.  HEENT: Normal. Edentulous. Anicteric  NECK: Supple, JVP 7-8 cm. Carotids OK.  CARDIAC:  Sternal wound ok  Mechanical heart sounds with LVAD hum present.  LUNGS:  CTAB, normal effort. Dull at bases ABDOMEN:  NT, ND, no HSM. No bruits or masses. +BS  LVAD exit site: Dressing dry and intact. No erythema or drainage. Stabilization device present and accurately applied. Driveline dressing  changed daily per sterile technique. Extremities: no cyanosis, clubbing, rash, edema Neuro: alert & oriented x 3, cranial nerves grossly intact. moves all 4 extremities w/o difficulty. Affect pleasant  Telemetry    NSR 90s, personally reviewed.   EKG   No new tracings.    Labs    Basic Metabolic Panel: Recent Labs  Lab 05/16/17 0529  05/17/17 0428  05/18/17 0346 05/18/17 1703 05/19/17 0325 05/20/17 0501 05/21/17 0453  NA 139   < > 136   < > 134* 133* 135 135 135  K 4.9   < > 4.4   < > 3.8 3.9 3.5 3.9 4.2  CL 105   < > 103   < > 98* 94* 97* 100* 97*  CO2 24  --  24  --  28  --  30 27 27   GLUCOSE 137*   < > 147*   < > 108* 105* 87 107* 122*  BUN 26*   < > 22*   < > 22* 21* 25* 25* 22*  CREATININE 1.27*   < > 1.36*   < > 1.26* 0.90 1.13 1.21 1.10  CALCIUM 8.6*  --  8.8*  --  8.9  --  8.9 8.5* 9.0  MG 2.5*   < > 1.9  --  1.7  --  1.7 2.1 1.8  PHOS 3.7  --  3.5  --  3.5  --  3.8  --   --    < > = values in this interval not displayed.   Liver Function Tests: Recent Labs  Lab 05/17/17 0428 05/18/17 0346 05/19/17 0325 05/20/17 0501 05/21/17 0453  AST 67* 39 26 25 27   ALT 16* 15* 13* 14* 16*  ALKPHOS 60 59 64 80 106  BILITOT 3.2* 3.0* 2.2* 1.6* 1.4*  PROT 6.7 6.5 6.6 6.5 7.6  ALBUMIN 2.9* 2.6* 2.5* 2.3* 2.4*   No results for input(s): LIPASE, AMYLASE in the last 168 hours. No results for input(s): AMMONIA in the last 168 hours.  CBC: Recent Labs  Lab 05/17/17 0428  05/18/17 0346 05/18/17 1703 05/19/17 0325 05/20/17 0501 05/21/17 0453  WBC 16.1*  --  15.5*  --  11.7* 9.4 12.3*  NEUTROABS 12.6*  --  14.1*  --  9.1* 6.7 8.9*  HGB 9.6*   < > 8.9* 8.2* 8.9* 8.3* 9.6*  HCT 29.6*   < > 27.6* 24.0* 27.2* 25.8* 28.7*  MCV 85.3  --  85.4  --  85.3 84.6 84.4  PLT 153  --  153  --  186 213 315   < > = values in this interval not displayed.   Cardiac Enzymes: No results for input(s): CKTOTAL, CKMB, CKMBINDEX, TROPONINI in the last 168 hours. BNP: BNP (last 3  results) Recent Labs    03/25/17 1401 04/21/17 1212 05/16/17 0529  BNP 835.4* 483.0* 262.3*   ProBNP (last 3 results) No results for input(s): PROBNP in the last 8760 hours.  Other results:  Imaging: Dg Chest Port 1 View  Result Date: 05/20/2017 CLINICAL DATA:  LVAD EXAM: PORTABLE CHEST 1 VIEW COMPARISON:  Chest radiograph from one day prior. FINDINGS: Right PICC terminates over the right atrium. Intact sternotomy wires. Stable configuration of visualized portion of left ventricular assist device overlying the left diaphragm. Stable cardiomediastinal silhouette with mild to moderate cardiomegaly. No pneumothorax. Stable small bilateral pleural effusions. No pulmonary edema. Stable mild bibasilar atelectasis. IMPRESSION: 1. Stable cardiomegaly without pulmonary edema. 2. Stable small bilateral pleural effusions and mild bibasilar atelectasis. Electronically Signed   By: Delbert Phenix M.D.   On: 05/20/2017 07:33    Medications:    Scheduled Medications: . allopurinol  100 mg Oral Daily  . amiodarone  200 mg Oral BID  . aspirin EC  81 mg Oral Daily  . bisacodyl  10 mg Oral Daily   Or  . bisacodyl  10 mg Rectal Daily  . chlorhexidine  15 mL Mouth Rinse BID  . Chlorhexidine Gluconate Cloth  6 each Topical Daily  . clonazePAM  1 mg Oral QHS  . docusate sodium  200 mg Oral Daily  . feeding supplement (PRO-STAT SUGAR FREE 64)  30 mL Oral BID  . furosemide  40 mg Oral BID  . guaiFENesin  600 mg Oral BID  . insulin aspart  0-15 Units Subcutaneous TID WC  . insulin aspart  0-5 Units Subcutaneous QHS  . lactose free nutrition  237 mL Oral TID WC  . mouth rinse  15 mL Mouth Rinse q12n4p  . metoCLOPramide (REGLAN) injection  10 mg Intravenous Q6H  . pantoprazole  40 mg Oral Daily  . potassium chloride  40 mEq Oral BID  . sodium chloride flush  10-40 mL Intracatheter Q12H  . Warfarin - Pharmacist Dosing Inpatient   Does not apply q1800    Infusions: . sodium chloride    . lactated  ringers 10 mL/hr at 05/21/17 0700  .  milrinone 0.125 mcg/kg/min (05/21/17 0700)    PRN Medications:    Assessment:   Benjamin Sherman is a 47 y.o. 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 eosinophilic cardiomyopathy, LV thrombus, hypothyroidism, DM2, CKD Stage II-III and traumatic [facility] 11/18 after fall.   Admitted with cardiogenic shock.  Plan/Discussion:    1. S/P HMIII 4/30 under DT - LDH 265  - Continue ASA 81 daily.  - Warfarin for goal INR 2-2.5 => 1.96. 2. Acute on chronic systolic HF -> cardiogenic shock:  Nonischemic cardiomyopathy. ECHO 11/2016 EF 25-30% CMRI EF 22%. Bedside echo in HF clinic EF 20% in 3/19.  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 Did not respond to steroids. Admitted 4/6 with cardiogenic shock and volume overload co-ox 38%. Echo 04/23/17: LVEF 20-25% with large LV clot, RV mild to moderately down.   - Coox 57.3% on milrinone 0.125 mcg/kg/min. Will discuss continued wean with MD.  - Continue lasix 40 mg po BID.  3. Atrial flutter: New onset. S/p DCCV to NSR on 4/12.  - Maintaining NSR on oral amio.  4. AKI CKD Stage II-III: Likely ATN due to shock.   - Resolved.  5. H/o LV Thrombus - now recurrent on echo 04/23/17 - No change to current plan.   6. DMII - Per TCTS 7. Hypothyroidism:  - TFTs OK 04/22/17. TSH elevated at 10.848 4/23. T4 and T3 normal.  - No change.  8. RLE erythema/cellulitis:  - Started doxy 4/20. Resolved.  Continue to walk halls. Likely OK for 2C today.   Graciella Freer, PA-C  05/21/2017 8:35 AM   Advanced Heart Failure Team Pager [phone] (M-F; 7a - 4p)  Please contact [facility] for night-coverage after hours (4p -7a ) and weekends on [email or web address]  Patient seen and examined with the above-signed Advanced Practice Provider and/or Housestaff. I personally reviewed laboratory data, imaging studies and relevant notes. I independently examined the patient and formulated the important  aspects of the plan. I have edited the note to reflect any of my changes or salient points. I have personally discussed the plan with the patient and/or family.  POD #7 VAD implant. Remains on low-dose milrinone for RV support. Co-ox marginal. Would not stop today. Consider addition of sildenafil as needed. Volume status nearing baseline. May need to cut back lasix tomorrow. INR 1.96 Discussed dosing with PharmD personally.Rhythm stable on amio. VAD interrogated personally. Parameters stable. Continue ambulation and teaching. Family did not show for teaching today - stressed need to have them participate on given schedule. Ramp echo midweek.  [name], MD  9:01 PM

## 2017-05-21 NOTE — Progress Notes (Signed)
CSW met with patient at bedside. Patient reports he is feeling improved and walked the hall today. CSW and patient discussed VAD life and patient very grateful for improved health. Patient animated and smiled throughout the conversation which is a big improvement from previous flat affect prior to implant. CSW encouraged patient with continued improvement and motivation. CSW will continue to follow throughout implant hospitalization. Raquel Sarna, [place], [place]

## 2017-05-21 NOTE — Progress Notes (Signed)
VAD Coordinator to patient's room at 1:45 for scheduled dressing education. Sister "left" and best friend did not show today for education. Pt states they will be here "tomorrow".  Existing VAD dressing removed and site care performed using sterile technique. Drive line exit site cleaned with Chlora prep applicators x 2, allowed to dry, and gauze dressing with silver strip re-applied. Exit site with no tissue ingrowth, one suture intact, the velour is fully implanted at exit site. Some tenderness with cleansing, bloody drainage with fat necrosis drainage noted; no foul odor or rash noted.   Stressed importance of caregivers coming tomorrow to begin dressing change education. Pt verbalized understanding of same.

## 2017-05-21 NOTE — Progress Notes (Signed)
Physical Therapy Treatment Patient Details Name: Benjamin Sherman MRN: [id number] DOB: 1970/08/28 Today's Date: 05/21/2017    History of Present Illness 47 yo admitted with cardiogenic shock, s/p RHC and IABP insertion with continued shock 4/29 , teeth extraction 4/26, HM111 4/30, extubated 5/1. PMHx: HF, cardiomyopathy, LV thrombus, hypothyroidism, DM, CKD, traumatic [facility] 11/2016    PT Comments    Pt pleasant, in chair on arrival and able to state no pushing/pulling/lifting but required cues to recall moving in tube and cues with activity to keep his elbows in. Pt required mod cues to transition to wall to battery power and min cues to return to wall power end of session. Pt continues to need cues to recall all back up equipment and was able to state end of session. Pt with excellent mobility progression and will plan to attempt further progression of basic transfers and gait without AD with progression.     Follow Up Recommendations  Home health PT;Supervision/Assistance - 24 hour     Equipment Recommendations  Rolling walker with 5" wheels;3in1 (PT)    Recommendations for Other Services       Precautions / Restrictions Precautions Precautions: Sternal Precaution Comments: LVAD    Mobility  Bed Mobility               General bed mobility comments: in chair on arrival  Transfers Overall transfer level: Needs assistance   Transfers: Sit to/from Stand Sit to Stand: Supervision         General transfer comment: cues for sequence, reciprocal scooting and hands on thighs  Ambulation/Gait Ambulation/Gait assistance: Min guard Ambulation Distance (Feet): 800 Feet Assistive device: Rolling walker (2 wheeled) Gait Pattern/deviations: Step-through pattern;Decreased stride length   Gait velocity interpretation: 1.31 - 2.62 ft/sec, indicative of limited community ambulator General Gait Details: cues for posture and position in RW as well as to avoid obstacles to right, pt  with tendency to hug right wall   Stairs             Wheelchair Mobility    Modified Rankin (Stroke Patients Only)       Balance Overall balance assessment: Needs assistance   Sitting balance-Leahy Scale: Good       Standing balance-Leahy Scale: Fair                              Cognition Arousal/Alertness: Awake/alert Behavior During Therapy: WFL for tasks assessed/performed Overall Cognitive Status: Impaired/Different from baseline Area of Impairment: Memory                     Memory: Decreased recall of precautions       Problem Solving: Slow processing General Comments: difficulty with power source transition, recall of all equipment needs and sternal precautions      Exercises      General Comments        Pertinent Vitals/Pain Pain Assessment: No/denies pain    Home Living                      Prior Function            PT Goals (current goals can now be found in the care plan section) Progress towards PT goals: Progressing toward goals    Frequency    Min 3X/week      PT Plan Current plan remains appropriate;Frequency needs to be updated    Co-evaluation  AM-PAC PT "6 Clicks" Daily Activity  Outcome Measure  Difficulty turning over in bed (including adjusting bedclothes, sheets and blankets)?: A Little Difficulty moving from lying on back to sitting on the side of the bed? : A Little Difficulty sitting down on and standing up from a chair with arms (e.g., wheelchair, bedside commode, etc,.)?: A Little Help needed moving to and from a bed to chair (including a wheelchair)?: A Little Help needed walking in hospital room?: A Little Help needed climbing 3-5 steps with a railing? : A Little 6 Click Score: 18    End of Session   Activity Tolerance: Patient tolerated treatment well Patient left: in chair;with call bell/phone within reach;with nursing/sitter in room Nurse  Communication: Mobility status;Precautions PT Visit Diagnosis: Other abnormalities of gait and mobility (R26.89)     Time: 4696-2952 PT Time Calculation (min) (ACUTE ONLY): 29 min  Charges:  $Gait Training: 8-22 mins $Therapeutic Activity: 8-22 mins                    G Codes:       [name], PT [phone]    Daphney Hopke B Samaya Boardley 05/21/2017, 10:09 AM

## 2017-05-21 NOTE — Progress Notes (Signed)
ANTICOAGULATION CONSULT NOTE - Follow-Up Consult  Pharmacy Consult for Warfarin Indication: LVAD  Allergies  Allergen Reactions  . Bee Venom     UNSPECIFIED REACTION     Patient Measurements: Height: 6' (182.9 cm) Weight: 207 lb 0.2 oz (93.9 kg) IBW/kg (Calculated) : 77.6 Heparin Dosing Weight: n/a  Vital Signs: Temp: 98.6 F (37 C) (05/06 0000) Temp Source: Oral (05/06 0000) Pulse Rate: 96 (05/06 0600)  Labs: Recent Labs    05/19/17 0325 05/20/17 0501 05/21/17 0453  HGB 8.9* 8.3* 9.6*  HCT 27.2* 25.8* 28.7*  PLT 186 213 315  LABPROT 25.0* 23.6* 22.2*  INR 2.29 2.12 1.96  CREATININE 1.13 1.21 1.10    Estimated Creatinine Clearance: 97.7 mL/min (by C-G formula based on SCr of 1.1 mg/dL).   Medical History: Past Medical History:  Diagnosis Date  . CHF (congestive heart failure) (HCC)   . Diabetes mellitus without complication (HCC)     Medications:  Scheduled:  . allopurinol  100 mg Oral Daily  . amiodarone  200 mg Oral BID  . aspirin EC  81 mg Oral Daily  . bisacodyl  10 mg Oral Daily   Or  . bisacodyl  10 mg Rectal Daily  . chlorhexidine  15 mL Mouth Rinse BID  . Chlorhexidine Gluconate Cloth  6 each Topical Daily  . clonazePAM  1 mg Oral QHS  . docusate sodium  200 mg Oral Daily  . feeding supplement (PRO-STAT SUGAR FREE 64)  30 mL Oral BID  . furosemide  40 mg Oral BID  . guaiFENesin  600 mg Oral BID  . insulin aspart  0-15 Units Subcutaneous TID WC  . insulin aspart  0-5 Units Subcutaneous QHS  . lactose free nutrition  237 mL Oral TID WC  . mouth rinse  15 mL Mouth Rinse q12n4p  . metoCLOPramide (REGLAN) injection  10 mg Intravenous Q6H  . pantoprazole  40 mg Oral Daily  . potassium chloride  40 mEq Oral BID  . sodium chloride flush  10-40 mL Intracatheter Q12H  . Warfarin - Pharmacist Dosing Inpatient   Does not apply q1800    Assessment: 47 yo male s/p LVAD placement 4/30 on warfarin per pharmacy. INR slightly subtherapeutic today at  1.96, CBC/LDH stable.  Goal of Therapy:  INR goal 2-2.5 Monitor platelets by anticoagulation protocol: Yes   Plan:  -Warfarin 5mg  PO x1 tonight -Daily INR  Fredonia Highland, PharmD, BCPS PGY-2 Cardiology Pharmacy Resident Pager: [phone] 05/21/2017

## 2017-05-22 LAB — CBC WITH DIFFERENTIAL/PLATELET
Basophils Absolute: 0 10*3/uL (ref 0.0–0.1)
Basophils Relative: 0 %
Eosinophils Absolute: 0 10*3/uL (ref 0.0–0.7)
Eosinophils Relative: 0 %
HCT: 28.3 % — ABNORMAL LOW (ref 39.0–52.0)
Hemoglobin: 9.3 g/dL — ABNORMAL LOW (ref 13.0–17.0)
Lymphocytes Relative: 8 %
Lymphs Abs: 1.3 10*3/uL (ref 0.7–4.0)
MCH: 27.6 pg (ref 26.0–34.0)
MCHC: 32.9 g/dL (ref 30.0–36.0)
MCV: 84 fL (ref 78.0–100.0)
Monocytes Absolute: 2.3 10*3/uL — ABNORMAL HIGH (ref 0.1–1.0)
Monocytes Relative: 14 %
Neutro Abs: 12.9 10*3/uL — ABNORMAL HIGH (ref 1.7–7.7)
Neutrophils Relative %: 78 %
Platelets: 306 10*3/uL (ref 150–400)
RBC: 3.37 MIL/uL — ABNORMAL LOW (ref 4.22–5.81)
RDW: 15.9 % — ABNORMAL HIGH (ref 11.5–15.5)
WBC: 16.5 10*3/uL — ABNORMAL HIGH (ref 4.0–10.5)

## 2017-05-22 LAB — BRAIN NATRIURETIC PEPTIDE: B Natriuretic Peptide: 221.3 pg/mL — ABNORMAL HIGH (ref 0.0–100.0)

## 2017-05-22 LAB — COMPREHENSIVE METABOLIC PANEL
ALT: 14 U/L — ABNORMAL LOW (ref 17–63)
AST: 22 U/L (ref 15–41)
Albumin: 2.3 g/dL — ABNORMAL LOW (ref 3.5–5.0)
Alkaline Phosphatase: 130 U/L — ABNORMAL HIGH (ref 38–126)
Anion gap: 10 (ref 5–15)
BUN: 19 mg/dL (ref 6–20)
CO2: 27 mmol/L (ref 22–32)
Calcium: 8.9 mg/dL (ref 8.9–10.3)
Chloride: 94 mmol/L — ABNORMAL LOW (ref 101–111)
Creatinine, Ser: 1.09 mg/dL (ref 0.61–1.24)
GFR calc Af Amer: >60 mL/min (ref >60)
GFR calc non Af Amer: >60 mL/min (ref >60)
Glucose, Bld: 150 mg/dL — ABNORMAL HIGH (ref 65–99)
Potassium: 4.5 mmol/L (ref 3.5–5.1)
Sodium: 131 mmol/L — ABNORMAL LOW (ref 135–145)
Total Bilirubin: 1.4 mg/dL — ABNORMAL HIGH (ref 0.3–1.2)
Total Protein: 7.4 g/dL (ref 6.5–8.1)

## 2017-05-22 LAB — LACTATE DEHYDROGENASE: LDH: 265 U/L — ABNORMAL HIGH (ref 98–192)

## 2017-05-22 LAB — GLUCOSE, CAPILLARY
Glucose-Capillary: 136 mg/dL — ABNORMAL HIGH (ref 65–99)
Glucose-Capillary: 186 mg/dL — ABNORMAL HIGH (ref 65–99)
Glucose-Capillary: 140 mg/dL — ABNORMAL HIGH (ref 65–99)
Glucose-Capillary: 142 mg/dL — ABNORMAL HIGH (ref 65–99)
Glucose-Capillary: 202 mg/dL — ABNORMAL HIGH (ref 65–99)

## 2017-05-22 LAB — COOXEMETRY PANEL
Carboxyhemoglobin: 1.5 % (ref 0.5–1.5)
Methemoglobin: 1.2 % (ref 0.0–1.5)
O2 Saturation: 61.5 %
Total hemoglobin: 10.2 g/dL — ABNORMAL LOW (ref 12.0–16.0)

## 2017-05-22 LAB — PROTIME-INR
INR: 2.04
Prothrombin Time: 22.8 seconds — ABNORMAL HIGH (ref 11.4–15.2)

## 2017-05-22 LAB — MAGNESIUM: Magnesium: 1.8 mg/dL (ref 1.7–2.4)

## 2017-05-22 MED ORDER — CEFAZOLIN SODIUM-DEXTROSE 2-4 GM/100ML-% IV SOLN
2.0000 g | Freq: Three times a day (TID) | INTRAVENOUS | Status: DC
  Administered 2017-05-22: 2 g via INTRAVENOUS
  Filled 2017-05-22 (×2): qty 100

## 2017-05-22 MED ORDER — MAGNESIUM SULFATE 2 GM/50ML IV SOLN
2.0000 g | Freq: Once | INTRAVENOUS | Status: AC
  Administered 2017-05-22: 2 g via INTRAVENOUS
  Filled 2017-05-22: qty 50

## 2017-05-22 MED ORDER — POTASSIUM CHLORIDE CRYS ER 20 MEQ PO TBCR
40.0000 meq | EXTENDED_RELEASE_TABLET | Freq: Every day | ORAL | Status: DC
  Administered 2017-05-23 – 2017-06-04 (×14): 40 meq via ORAL
  Filled 2017-05-22 (×13): qty 2

## 2017-05-22 MED ORDER — ACETAMINOPHEN 325 MG PO TABS
650.0000 mg | ORAL_TABLET | Freq: Four times a day (QID) | ORAL | Status: DC | PRN
  Administered 2017-05-22 – 2017-05-31 (×11): 650 mg via ORAL
  Filled 2017-05-22 (×11): qty 2

## 2017-05-22 MED ORDER — PIPERACILLIN-TAZOBACTAM 3.375 G IVPB
3.3750 g | Freq: Three times a day (TID) | INTRAVENOUS | Status: DC
  Administered 2017-05-23 – 2017-05-28 (×18): 3.375 g via INTRAVENOUS
  Filled 2017-05-22 (×19): qty 50

## 2017-05-22 MED ORDER — SORBITOL 70 % SOLN: 70.0000 mL | Freq: Once | Status: DC

## 2017-05-22 MED ORDER — SORBITOL 70 % SOLN
30.0000 mL | Freq: Once | Status: AC
  Administered 2017-05-22: 30 mL via ORAL
  Filled 2017-05-22: qty 30

## 2017-05-22 MED ORDER — WARFARIN SODIUM 5 MG PO TABS
5.0000 mg | ORAL_TABLET | Freq: Once | ORAL | Status: AC
  Administered 2017-05-22: 5 mg via ORAL
  Filled 2017-05-22: qty 1

## 2017-05-22 MED ORDER — VANCOMYCIN HCL IN DEXTROSE 1-5 GM/200ML-% IV SOLN
1000.0000 mg | Freq: Three times a day (TID) | INTRAVENOUS | Status: DC
  Administered 2017-05-22 – 2017-05-23 (×2): 1000 mg via INTRAVENOUS
  Filled 2017-05-22 (×2): qty 200

## 2017-05-22 MED ORDER — MAGNESIUM SULFATE 2 GM/50ML IV SOLN
INTRAVENOUS | Status: AC
  Filled 2017-05-22: qty 50

## 2017-05-22 NOTE — Care Management Note (Signed)
Case Management Note Donn Pierini RN, BSN Unit 4E-Case Manager-- 2H coverage [phone]  Patient Details  Name: Benjamin Sherman MRN: [id number] Date of Birth: 1970/08/20  Subjective/Objective:  Pt admitted with cardiogenic shock and atrial flutter now on IV milrinone and IV lasix gtt.                 Action/Plan: PTA pt lived at home- CM to follow for transition of care needs.   Expected Discharge Date:  04/24/17               Expected Discharge Plan:  Home w Home Health Services  In-House Referral:     Discharge planning Services  CM Consult  Post Acute Care Choice:    Choice offered to:     DME Arranged:    DME Agency:     HH Arranged:    HH Agency:     Status of Service:  In process, will continue to follow  If discussed at Long Length of Stay Meetings, dates discussed:    Discharge Disposition:   Additional Comments:  05/22/17- 1420- Donn Pierini RN, CM- pt s/p VAD placement on 4/30- CM following for transition of care needs- per PT eval HH recommended- will need HH orders prior to discharge- and DME- 3n1 and RW-  [facility] can provide services for VAD patients.   Darrold Span, RN 05/22/2017, 2:26 PM

## 2017-05-22 NOTE — Progress Notes (Signed)
Palliative:  Benjamin Sherman is eating his lunch. Says he just feels tired and fatigued but also not sleeping well at night. He says he feels very tired after his walks. Discussed the adjustment on his body and building his stamina as well as the importance of sleep. He says plan to move to [facility] today and hoping he can sleep better there. All this said he is smiling and says "I feel like I'm healing." He is pleased with his progress. When asked about his caregivers coming to learn he rolls his eyes and says that he will have to "get on them." He says that he plans to live next door to Gene with Gene's neighbor/cousin. Emotional support provided.   No charge  Yong Channel, NP Palliative Medicine Team Pager # [phone] (M-F 8a-5p) Team Phone # [phone] (Nights/Weekends)

## 2017-05-22 NOTE — Progress Notes (Signed)
CSW met with patient at bedside. Patient reports he had difficulty sleeping last night but was able to get a nap today. Patient reports continued improvement and ambulating around the unit. Patient reports his caregivers will be in tomorrow for dressing change training and he will be moved to [facility] later today. Patient appears to be in good spirits and has positive attitude towards recovery. CSW provided supportive intervention and will continue to follow throughout implant hospitalization. Raquel Sarna, [place], [place]

## 2017-05-22 NOTE — Progress Notes (Signed)
Temp 101.3 orally.  Dr. Barry Dienes notified.  Blood cultures ordered.  Will continue to monitor.

## 2017-05-22 NOTE — Progress Notes (Signed)
ANTIBIOTIC CONSULT NOTE - INITIAL  Pharmacy Consult for vancomycin and cefazolin Indication: Empiric therapy in febrile post LVAD patient  Allergies  Allergen Reactions  . Bee Venom     UNSPECIFIED REACTION     Patient Measurements: Height: 6' (182.9 cm) Weight: 217 lb 9.5 oz (98.7 kg) IBW/kg (Calculated) : 77.6   Vital Signs: Temp: 101.3 F (38.5 C) (05/07 2000) Temp Source: Oral (05/07 2000) BP: 101/78 (05/07 2000) Pulse Rate: 106 (05/07 2000) Intake/Output from previous day: 05/06 0701 - 05/07 0700 In: 910.5 [P.O.:660; I.V.:200.5; IV Piggyback:50] Out: 1950 [Urine:1950] Intake/Output from this shift: No intake/output data recorded.  Labs: Recent Labs    05/20/17 0501 05/21/17 0453 05/22/17 0403  WBC 9.4 12.3* 16.5*  HGB 8.3* 9.6* 9.3*  PLT 213 315 306  CREATININE 1.21 1.10 1.09   Estimated Creatinine Clearance: 100.8 mL/min (by C-G formula based on SCr of 1.09 mg/dL). No results for input(s): VANCOTROUGH, VANCOPEAK, VANCORANDOM, GENTTROUGH, GENTPEAK, GENTRANDOM, TOBRATROUGH, TOBRAPEAK, TOBRARND, AMIKACINPEAK, AMIKACINTROU, AMIKACIN in the last 72 hours.   Microbiology: Recent Results (from the past 720 hour(s))  Surgical pcr screen     Status: Abnormal   Collection Time: 05/08/17  8:00 AM  Result Value Ref Range Status   MRSA, PCR NEGATIVE NEGATIVE Final   Staphylococcus aureus POSITIVE (A) NEGATIVE Final    Comment: (NOTE) The Xpert SA Assay (FDA approved for NASAL specimens in patients 35 years of age and older), is one component of a comprehensive surveillance program. It is not intended to diagnose infection nor to guide or monitor treatment. Performed at [facility], [address], [place], [place] [address]   Surgical pcr screen     Status: None   Collection Time: 05/10/17  9:53 PM  Result Value Ref Range Status   MRSA, PCR NEGATIVE NEGATIVE Final   Staphylococcus aureus NEGATIVE NEGATIVE Final    Comment: (NOTE) The Xpert SA Assay  (FDA approved for NASAL specimens in patients 33 years of age and older), is one component of a comprehensive surveillance program. It is not intended to diagnose infection nor to guide or monitor treatment. Performed at [facility], [address] [place], [place] [address]   MRSA PCR Screening     Status: None   Collection Time: 05/14/17  2:00 PM  Result Value Ref Range Status   MRSA by PCR NEGATIVE NEGATIVE Final    Comment:        The GeneXpert MRSA Assay (FDA approved for NASAL specimens only), is one component of a comprehensive MRSA colonization surveillance program. It is not intended to diagnose MRSA infection nor to guide or monitor treatment for MRSA infections. Performed at [facility], [address], [place], [place] [address]     Medical History: Past Medical History:  Diagnosis Date  . CHF (congestive heart failure) (HCC)   . Diabetes mellitus without complication [facility])      Assessment: 47 yo male who is s/p LVAD and now has temperature of 101.3 and WBC of 16. Pharmacy has been asked to dose cefazolin and vancomycin. CrCl ~100 ml/min  Goal of Therapy:  Vancomycin trough 15-20 mcg/ml  Plan:  Cefazolin 2gm IV q8h Vancomycin 1000mg  IV q8h Vanc troughs as needed Monitor fever curve, CBC, clinical progress, LOT, and deescalation.   Hanna Aultman A. Jeanella Craze, PharmD, BCPS Clinical Pharmacist  Pager: [phone]  05/22/2017,8:51 PM

## 2017-05-22 NOTE — Progress Notes (Signed)
ANTICOAGULATION CONSULT NOTE - Follow-Up Consult  Pharmacy Consult for Warfarin Indication: LVAD  Allergies  Allergen Reactions  . Bee Venom     UNSPECIFIED REACTION     Patient Measurements: Height: 6' (182.9 cm) Weight: 217 lb 9.5 oz (98.7 kg) IBW/kg (Calculated) : 77.6 Heparin Dosing Weight: n/a  Vital Signs: Temp: 97.8 F (36.6 C) (05/07 0914) Temp Source: Oral (05/07 0914) BP: 95/69 (05/07 0400) Pulse Rate: 101 (05/07 0700)  Labs: Recent Labs    05/20/17 0501 05/21/17 0453 05/22/17 0403  HGB 8.3* 9.6* 9.3*  HCT 25.8* 28.7* 28.3*  PLT 213 315 306  LABPROT 23.6* 22.2* 22.8*  INR 2.12 1.96 2.04  CREATININE 1.21 1.10 1.09    Estimated Creatinine Clearance: 100.8 mL/min (by C-G formula based on SCr of 1.09 mg/dL).   Medical History: Past Medical History:  Diagnosis Date  . CHF (congestive heart failure) (HCC)   . Diabetes mellitus without complication (HCC)     Medications:  Scheduled:  . allopurinol  100 mg Oral Daily  . amiodarone  200 mg Oral BID  . aspirin EC  81 mg Oral Daily  . bisacodyl  10 mg Oral Daily   Or  . bisacodyl  10 mg Rectal Daily  . Chlorhexidine Gluconate Cloth  6 each Topical Daily  . clonazePAM  1 mg Oral QHS  . docusate sodium  200 mg Oral Daily  . feeding supplement (PRO-STAT SUGAR FREE 64)  30 mL Oral BID  . furosemide  40 mg Oral BID  . guaiFENesin  600 mg Oral BID  . insulin aspart  0-15 Units Subcutaneous TID WC  . insulin aspart  0-5 Units Subcutaneous QHS  . lactose free nutrition  237 mL Oral TID WC  . pantoprazole  40 mg Oral Daily  . [START ON 05/23/2017] potassium chloride  40 mEq Oral Daily  . sodium chloride flush  10-40 mL Intracatheter Q12H  . sorbitol  30 mL Oral Once  . Warfarin - Pharmacist Dosing Inpatient   Does not apply q1800    Assessment: 47 yo male s/p LVAD placement 4/30 on warfarin per pharmacy. INR therapeutic today at 2.04, CBC/LDH stable.  Goal of Therapy:  INR goal 2-2.5 Monitor platelets  by anticoagulation protocol: Yes   Plan:  -Warfarin 5mg  PO x1 tonight -Daily INR  Fredonia Highland, PharmD, BCPS PGY-2 Cardiology Pharmacy Resident Pager: [phone] 05/22/2017

## 2017-05-22 NOTE — Progress Notes (Signed)
LVAD Coordinator Rounding Note:  Admitted 04/21/17 with cardiogenic shock and atrial flutter.   HM III LVAD implanted on 05/15/17 by Dr. Maren Beach  under Destination Therapy criteria due to limited social support.   Pt sitting up in the chair this morning, states he just walked one lap. He denies any SOB, says he received something for pain earlier and is drowsy, but pain free at this time.  Vital signs: Temp:  97.8 HR: 101 Auto BP:  95/69 (79)  Doppler:  80 O2 Sat: 100 % on RA Wt: 195>211>212>208>205>207>200>217(inaccurate wt)lbs   LVAD interrogation reveals:  Speed: 5400 Flow: 4.2 Power:  4.0w PI: 4.6 Alarms: none Events: none Hematocrit: 29 Fixed speed: 5400 Low speed limit: 5100  Labs:  LDH trend: 253>317>287>263>244>265>265  INR trend: 1.24>1.68>2.19>2.29>2.12>1.96>2.04  WBC trend: 16.5>16.1>15.5>11.7>12.3>16.5  Anticoagulation Plan: -INR Goal: 2.0 - 2.5  -ASA Dose: 81 mg (started 05/18/17 with therapeutic INR)  Blood Products:   Intra op: 05/15/17 - 1 unit plts - 1 unit FFP  Post op:  - 05/15/17>1 RBC, 2 FFP, DDAVP, Factor VII -05/16/17>1 PC  Device: N/A  Arrythmias: increased ectopy, short run Afib 05/15/17 - Amiodarone started 30 mg/hr-switched to PO today  Respiratory: extubated 05/16/17  Nitric Oxide: off 05/16/17  Gtts: Milrinone> stop 05/22/17 Levo>8 mcg/min-off 5/2 Epi>1 mcg/min-off 5/3 Amiodarone>30 mg/hr-off 5/2 Lasix 6 mg/hr-off 5/3  Adverse Events on VAD: -none  VAD Education:  Discharge planning initiated with patient this am and covered the following: 1. Will need home address, power company, and name of customer on the account for notification purposes.     Pt reports that Gene (his primary caregiver) will know this info. 2. Will meet with Gene and [name] today for VAD dressing change review and initial discharge teaching. Asked pt     to contact both and set up a time for same. He says Gene "will try to come today". 3. Will order VAD home  equipment today in preparation for discharge home. 4. Spoke with patient about initial Home Health involvement including nurse and physical therapy when he is            discharged home - pt agrees to same.  5. Spoke with patient about transition to Cardiac Rehab once strong enough to be discharged from home PT. Pt      lives in [place]. Informed him [facility] is VAD trained. He says he lives a few       miles from there and will plan on participating. 6.  Informed patient he will need frequent clinic visits when discharged home (at least weekly); when asked      about transportation plans, he replied he will be using [facility] transport. Will get Medicaid transportation # for      patient to contact prior to transportation needs.   Plan/Recommendations:  1. Called Gene to confirm dressing change and initiation of VAD discharge teaching today; he may be able to be here around 2 - 2:30 pm if work allows. Gave him my contact info and asked him to contact me to reschedule tomorrow afternoon or Thursday if unable to make it today. He agreed to same. He will also get pt's home address, power company, and name on account for notification purposes.  2. Call VAD pager if any questions re: VAD equipment or drive line site/care.  [name] RN, VAD Coordinator 24/7 VAD pager: [phone]

## 2017-05-22 NOTE — Progress Notes (Signed)
Spoke with Gene, primary caregiver. 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 He will check schedule and contact me re: availability either tomorrow afternoon or Thursday. I assured him we will work around his work schedule. Also told him, when he does have time to come for teaching session, we will cover as much as possible. He verbalized agreement to same.   Existing VAD dressing removed and site care performed using sterile technique. Drive line exit site cleaned with Chlora prep applicators x 2, allowed to dry, and gauze dressing with silver strip re-applied. Exit site with no tissue ingrowth, one suture intact, the velour is fully implanted at exit site. Some tenderness with cleansing, small amount bloody drainage noted; no foul odor or rash noted.   [name] RN, VAD Coordinator 24/7 VAD pager: [phone]

## 2017-05-22 NOTE — Progress Notes (Signed)
Patient ID: Benjamin Sherman, male   DOB: 1970/09/15, 47 y.o.   MRN: [id number]  HeartMate 3 Rounding Note  Subjective:    Tired today. Apparently did not sleep much overnight due to interruptions and having BP checked frequently. No shortness of breath.  Co-ox 61.5 this am and milrinone was stopped.  Eating some. No BM in 3 days. Received laxative today.  Tmax 100.8 last pm. 100 now.  LVAD INTERROGATION:  HeartMate IIl LVAD:  Flow 3.9 liters/min, speed 5400, power 4, PI 4.8.    Objective:    Vital Signs:   Temp:  [97.8 F (36.6 C)-100.8 F (38.2 C)] 100 F (37.8 C) (05/07 1627) Pulse Rate:  [96-104] 104 (05/07 1500) Resp:  [19-38] 38 (05/07 1500) BP: (95-103)/(69-84) 95/69 (05/07 0400) SpO2:  [92 %-100 %] 100 % (05/07 1500) Weight:  [98.7 kg (217 lb 9.5 oz)] 98.7 kg (217 lb 9.5 oz) (05/07 0628) Last BM Date: 05/19/17 Mean arterial Pressure 92  Intake/Output:   Intake/Output Summary (Last 24 hours) at 05/22/2017 1721 Last data filed at 05/22/2017 1400 Gross per 24 hour  Intake 661.85 ml  Output 2275 ml  Net -1613.15 ml     Physical Exam: General:  Sleepy, No resp difficulty Cor: distant heart sounds with LVAD hum present. Lungs: clear Abdomen: soft, nontender, nondistended. Good bowel sounds. Incision ok Extremities: no cyanosis, clubbing, rash, edema Neuro: sleepy but oriented   Telemetry: sinus 90-100  Labs: Basic Metabolic Panel: Recent Labs  Lab 05/16/17 0529  05/17/17 0428  05/18/17 0346 05/18/17 1703 05/19/17 0325 05/20/17 0501 05/21/17 0453 05/22/17 0403  NA 139   < > 136   < > 134* 133* 135 135 135 131*  K 4.9   < > 4.4   < > 3.8 3.9 3.5 3.9 4.2 4.5  CL 105   < > 103   < > 98* 94* 97* 100* 97* 94*  CO2 24  --  24  --  28  --  30 27 27 27   GLUCOSE 137*   < > 147*   < > 108* 105* 87 107* 122* 150*  BUN 26*   < > 22*   < > 22* 21* 25* 25* 22* 19  CREATININE 1.27*   < > 1.36*   < > 1.26* 0.90 1.13 1.21 1.10 1.09  CALCIUM 8.6*  --  8.8*  --  8.9  --   8.9 8.5* 9.0 8.9  MG 2.5*   < > 1.9  --  1.7  --  1.7 2.1 1.8 1.8  PHOS 3.7  --  3.5  --  3.5  --  3.8  --   --   --    < > = values in this interval not displayed.    Liver Function Tests: Recent Labs  Lab 05/18/17 0346 05/19/17 0325 05/20/17 0501 05/21/17 0453 05/22/17 0403  AST 39 26 25 27 22   ALT 15* 13* 14* 16* 14*  ALKPHOS 59 64 80 106 130*  BILITOT 3.0* 2.2* 1.6* 1.4* 1.4*  PROT 6.5 6.6 6.5 7.6 7.4  ALBUMIN 2.6* 2.5* 2.3* 2.4* 2.3*   No results for input(s): LIPASE, AMYLASE in the last 168 hours. No results for input(s): AMMONIA in the last 168 hours.  CBC: Recent Labs  Lab 05/18/17 0346 05/18/17 1703 05/19/17 0325 05/20/17 0501 05/21/17 0453 05/22/17 0403  WBC 15.5*  --  11.7* 9.4 12.3* 16.5*  NEUTROABS 14.1*  --  9.1* 6.7 8.9* 12.9*  HGB 8.9* 8.2* 8.9*  8.3* 9.6* 9.3*  HCT 27.6* 24.0* 27.2* 25.8* 28.7* 28.3*  MCV 85.4  --  85.3 84.6 84.4 84.0  PLT 153  --  186 213 315 306    INR: Recent Labs  Lab 05/18/17 0346 05/19/17 0325 05/20/17 0501 05/21/17 0453 05/22/17 0403  INR 2.19 2.29 2.12 1.96 2.04    Other results:  EKG:   Imaging:  No results found.   Medications:     Scheduled Medications: . allopurinol  100 mg Oral Daily  . amiodarone  200 mg Oral BID  . aspirin EC  81 mg Oral Daily  . bisacodyl  10 mg Oral Daily   Or  . bisacodyl  10 mg Rectal Daily  . Chlorhexidine Gluconate Cloth  6 each Topical Daily  . clonazePAM  1 mg Oral QHS  . docusate sodium  200 mg Oral Daily  . feeding supplement (PRO-STAT SUGAR FREE 64)  30 mL Oral BID  . furosemide  40 mg Oral BID  . guaiFENesin  600 mg Oral BID  . insulin aspart  0-15 Units Subcutaneous TID WC  . insulin aspart  0-5 Units Subcutaneous QHS  . lactose free nutrition  237 mL Oral TID WC  . pantoprazole  40 mg Oral Daily  . [START ON 05/23/2017] potassium chloride  40 mEq Oral Daily  . sodium chloride flush  10-40 mL Intracatheter Q12H  . warfarin  5 mg Oral ONCE-1800  . Warfarin -  Pharmacist Dosing Inpatient   Does not apply q1800     Infusions: . sodium chloride    . lactated ringers Stopped (05/22/17 0926)     PRN Medications:  hydrALAZINE, levalbuterol, morphine injection, ondansetron (ZOFRAN) IV, oxyCODONE, sodium chloride flush, traMADol   Assessment/Plan/Discussion:     POD 7 s/p HM III LVAD for acute on chronic systolic HF with cardiogenic shock due to nonischemic cardiomyopathy.  Hemodynamically stable off milrinone. Repeat Co-ox in am.  Maintaining sinus rhythm on amiodarone.  Therapeutic on Coumadin with INR 2.04.  DM: glucose under adequate control on SSI.  Low grade fever, WBC increased from 12.3 to 16.5. Repeat in am. Incisions all look good, drive line site reportedly looks good. Only line is new PICC. He may have atelectasis as source. Encourage IS, OOB.  Constipation: laxative given. Could also be source of low grade fever.  Awaiting bed on 2C.  I reviewed the LVAD parameters from today, and compared the results to the patient's prior recorded data.  No programming changes were made.  The LVAD is functioning within specified parameters.  The patient performs LVAD self-test daily.  LVAD interrogation was negative for any significant power changes, alarms or PI events/speed drops.  LVAD equipment check completed and is in good working order.  Back-up equipment present.   LVAD education done on emergency procedures and precautions and reviewed exit site care.  Length of Stay: [address]  Payton Doughty [facility] 05/22/2017, 5:21 PM

## 2017-05-22 NOTE — Progress Notes (Signed)
ANTIBIOTIC CONSULT NOTE   Pharmacy Consult for vancomycin and Zosyn Indication: Empiric therapy in febrile post LVAD patient  Allergies  Allergen Reactions  . Bee Venom     UNSPECIFIED REACTION     Patient Measurements: Height: 6' (182.9 cm) Weight: 217 lb 9.5 oz (98.7 kg) IBW/kg (Calculated) : 77.6   Vital Signs: Temp: 98.2 F (36.8 C) (05/07 2337) Temp Source: Axillary (05/07 2337) BP: 112/83 (05/07 2337) Pulse Rate: 105 (05/07 2337) Intake/Output from previous day: 05/06 0701 - 05/07 0700 In: 910.5 [P.O.:660; I.V.:200.5; IV Piggyback:50] Out: 1950 [Urine:1950] Intake/Output from this shift: Total I/O In: -  Out: 200 [Urine:200]  Labs: Recent Labs    05/20/17 0501 05/21/17 0453 05/22/17 0403  WBC 9.4 12.3* 16.5*  HGB 8.3* 9.6* 9.3*  PLT 213 315 306  CREATININE 1.21 1.10 1.09   Estimated Creatinine Clearance: 100.8 mL/min (by C-G formula based on SCr of 1.09 mg/dL). No results for input(s): VANCOTROUGH, VANCOPEAK, VANCORANDOM, GENTTROUGH, GENTPEAK, GENTRANDOM, TOBRATROUGH, TOBRAPEAK, TOBRARND, AMIKACINPEAK, AMIKACINTROU, AMIKACIN in the last 72 hours.   Microbiology: Recent Results (from the past 720 hour(s))  Surgical pcr screen     Status: Abnormal   Collection Time: 05/08/17  8:00 AM  Result Value Ref Range Status   MRSA, PCR NEGATIVE NEGATIVE Final   Staphylococcus aureus POSITIVE (A) NEGATIVE Final    Comment: (NOTE) The Xpert SA Assay (FDA approved for NASAL specimens in patients 80 years of age and older), is one component of a comprehensive surveillance program. It is not intended to diagnose infection nor to guide or monitor treatment. Performed at [facility], [address] [place], [place] [address]   Surgical pcr screen     Status: None   Collection Time: 05/10/17  9:53 PM  Result Value Ref Range Status   MRSA, PCR NEGATIVE NEGATIVE Final   Staphylococcus aureus NEGATIVE NEGATIVE Final    Comment: (NOTE) The Xpert SA Assay (FDA  approved for NASAL specimens in patients 4 years of age and older), is one component of a comprehensive surveillance program. It is not intended to diagnose infection nor to guide or monitor treatment. Performed at [facility], [address] [place], [place] [address]   MRSA PCR Screening     Status: None   Collection Time: 05/14/17  2:00 PM  Result Value Ref Range Status   MRSA by PCR NEGATIVE NEGATIVE Final    Comment:        The GeneXpert MRSA Assay (FDA approved for NASAL specimens only), is one component of a comprehensive MRSA colonization surveillance program. It is not intended to diagnose MRSA infection nor to guide or monitor treatment for MRSA infections. Performed at [facility], [address], [place], [place] [address]     Medical History: Past Medical History:  Diagnosis Date  . CHF (congestive heart failure) (HCC)   . Diabetes mellitus without complication [facility])      Assessment: 47 yo male who is s/p LVAD and now has temperature of 101.3 and WBC of 16. Pharmacy has been asked to dose Zosyn and vancomycin. CrCl ~100 ml/min  Goal of Therapy:  Vancomycin trough 15-20 mcg/ml  Plan:  Zosyn 3.375G IV q8 hours Vancomycin 1000mg  IV q8h Vanc troughs as needed Monitor fever curve, CBC, clinical progress, LOT, and deescalation.   Ruben Im, PharmD Clinical Pharmacist 05/22/2017 11:51 PM

## 2017-05-22 NOTE — Progress Notes (Addendum)
Patient ID: Benjamin Sherman, male   DOB: [birth year], 47 y.o.   MRN: [id number]    Advanced Heart Failure Rounding Note  Subjective:    Events:  -Admitted 04/21/17 with cardiogenic shock and atrial flutter.  -Central line placed. Initial co-ox 39%. -IABP placed 04/25/17-> 04/30/17 with persistent cardiogenic shock.  -DCCV atrial flutter 4/12.   -S/p multiple teeth extractions 05/11/17.  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  -Extubated on 5/1   Coox 61.5% on milrinone 0.125 mcg/kg/min. CVP 5-6  Feeling good. Walking halls this am without difficulty. No lightheadedness or dizziness. Pain well controlled. Weight shows up, but inaccurate. Rhythm stable   INR 2.04. No bleeding. Hgb stable   LVAD Interrogation HM 3: Speed: 5400 Flow: 4.3 PI: 3.3 Power: 4.0. No PI events   Objective:   Weight Range:  Vital Signs:   Temp:  [97.7 F (36.5 C)-100.8 F (38.2 C)] 98 F (36.7 C) (05/07 0357) Pulse Rate:  [96-111] 101 (05/07 0700) Resp:  [13-35] 27 (05/07 0700) BP: (95-103)/(69-84) 95/69 (05/07 0400) SpO2:  [67 %-100 %] 96 % (05/07 0700) Weight:  [217 lb 9.5 oz (98.7 kg)] 217 lb 9.5 oz (98.7 kg) (05/07 0628) Last BM Date: (Prior to surgery)  Weight change: Filed Weights   05/20/17 0700 05/21/17 0700 05/22/17 0628  Weight: 207 lb 0.2 oz (93.9 kg) 200 lb 6.4 oz (90.9 kg) 217 lb 9.5 oz (98.7 kg)   Intake/Output:   Intake/Output Summary (Last 24 hours) at 05/22/2017 0848 Last data filed at 05/22/2017 0600 Gross per 24 hour  Intake 907 ml  Output 1950 ml  Net -1043 ml    Physical Exam  CVP 6-7 cm  Physical Exam: GENERAL: Seated in chair eating breakfast.  HEENT: Normal. Edentulous. Anicteric  NECK: Supple, JVP 6-7 cm. Carotids OK.  CARDIAC:  Mechanical heart sounds with LVAD hum present.  LUNGS:  CTAB, normal effort. Dull at bases. No wheezes  ABDOMEN:  NT, ND, no HSM. No bruits or masses. +BS  LVAD exit site: Dressing dry and intact. No erythema or drainage. Stabilization device present and accurately  applied. Driveline dressing changed daily per sterile technique. Extremities: no cyanosis, clubbing, rash, edema Neuro: alert & oriented x 3, cranial nerves grossly intact. moves all 4 extremities w/o difficulty. Affect pleasant   Telemetry    NSR 90s, personally reviewed.   EKG   No new tracings.    Labs    Basic Metabolic Panel: Recent Labs  Lab 05/16/17 0529  05/17/17 0428  05/18/17 0346 05/18/17 1703 05/19/17 0325 05/20/17 0501 05/21/17 0453 05/22/17 0403  NA 139   < > 136   < > 134* 133* 135 135 135 131*  K 4.9   < > 4.4   < > 3.8 3.9 3.5 3.9 4.2 4.5  CL 105   < > 103   < > 98* 94* 97* 100* 97* 94*  CO2 24  --  24  --  28  --  30 27 27 27   GLUCOSE 137*   < > 147*   < > 108* 105* 87 107* 122* 150*  BUN 26*   < > 22*   < > 22* 21* 25* 25* 22* 19  CREATININE 1.27*   < > 1.36*   < > 1.26* 0.90 1.13 1.21 1.10 1.09  CALCIUM 8.6*  --  8.8*  --  8.9  --  8.9 8.5* 9.0 8.9  MG 2.5*   < > 1.9  --  1.7  --  1.7 2.1  1.8 1.8  PHOS 3.7  --  3.5  --  3.5  --  3.8  --   --   --    < > = values in this interval not displayed.   Liver Function Tests: Recent Labs  Lab 05/18/17 0346 05/19/17 0325 05/20/17 0501 05/21/17 0453 05/22/17 0403  AST 39 26 25 27 22   ALT 15* 13* 14* 16* 14*  ALKPHOS 59 64 80 106 130*  BILITOT 3.0* 2.2* 1.6* 1.4* 1.4*  PROT 6.5 6.6 6.5 7.6 7.4  ALBUMIN 2.6* 2.5* 2.3* 2.4* 2.3*   No results for input(s): LIPASE, AMYLASE in the last 168 hours. No results for input(s): AMMONIA in the last 168 hours.  CBC: Recent Labs  Lab 05/18/17 0346 05/18/17 1703 05/19/17 0325 05/20/17 0501 05/21/17 0453 05/22/17 0403  WBC 15.5*  --  11.7* 9.4 12.3* 16.5*  NEUTROABS 14.1*  --  9.1* 6.7 8.9* 12.9*  HGB 8.9* 8.2* 8.9* 8.3* 9.6* 9.3*  HCT 27.6* 24.0* 27.2* 25.8* 28.7* 28.3*  MCV 85.4  --  85.3 84.6 84.4 84.0  PLT 153  --  186 213 315 306   Cardiac Enzymes: No results for input(s): CKTOTAL, CKMB, CKMBINDEX, TROPONINI in the last 168 hours. BNP: BNP (last 3  results) Recent Labs    04/21/17 1212 05/16/17 0529 05/22/17 0403  BNP 483.0* 262.3* 221.3*   ProBNP (last 3 results) No results for input(s): PROBNP in the last 8760 hours.  Other results:  Imaging: No results found.  Medications:    Scheduled Medications: . allopurinol  100 mg Oral Daily  . amiodarone  200 mg Oral BID  . aspirin EC  81 mg Oral Daily  . bisacodyl  10 mg Oral Daily   Or  . bisacodyl  10 mg Rectal Daily  . Chlorhexidine Gluconate Cloth  6 each Topical Daily  . clonazePAM  1 mg Oral QHS  . docusate sodium  200 mg Oral Daily  . feeding supplement (PRO-STAT SUGAR FREE 64)  30 mL Oral BID  . furosemide  40 mg Oral BID  . guaiFENesin  600 mg Oral BID  . insulin aspart  0-15 Units Subcutaneous TID WC  . insulin aspart  0-5 Units Subcutaneous QHS  . lactose free nutrition  237 mL Oral TID WC  . pantoprazole  40 mg Oral Daily  . potassium chloride  40 mEq Oral BID  . sodium chloride flush  10-40 mL Intracatheter Q12H  . Warfarin - Pharmacist Dosing Inpatient   Does not apply q1800    Infusions: . sodium chloride    . lactated ringers 10 mL/hr at 05/22/17 0600  . magnesium sulfate 1 - 4 g bolus IVPB    . milrinone 0.125 mcg/kg/min (05/22/17 0600)    PRN Medications:    Assessment:   Benjamin Sherman is a 47 y.o. 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 eosinophilic cardiomyopathy, LV thrombus, hypothyroidism, DM2, CKD Stage II-III and traumatic [facility] 11/18 after fall.   Admitted with cardiogenic shock.  Plan/Discussion:    1. S/P HMIII 4/30 under DT - LDH 265.  - Continue ASA 81 daily.  - Warfarin for goal INR 2-2.5 => 2.04 2. Acute on chronic systolic HF -> cardiogenic shock:  Nonischemic cardiomyopathy. ECHO 11/2016 EF 25-30% CMRI EF 22%. Bedside echo in HF clinic EF 20% in 3/19.  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 Did not respond to steroids. Admitted 4/6 with cardiogenic shock and volume overload  co-ox 38%. Echo 04/23/17: LVEF 20-25% with large LV clot, RV mild to moderately down.   - Coox 61.5% on milrinone 0.125 mcg/kg/min. Will discuss continued wean with MD.  - Continue lasix 40 mg po BID.  3. Atrial flutter: New onset. S/p DCCV to NSR on 4/12.  - Maintaining NSR on oral amio.  4. AKI CKD Stage II-III: Likely ATN due to shock.   - Resolved 5. H/o LV Thrombus - now recurrent on echo 04/23/17 - No change to current plan.   6. DMII - Per TCTS.  7. Hypothyroidism:  - TFTs OK 04/22/17. TSH elevated at 10.848 4/23. T4 and T3 normal.  - No change to current plan.   8. RLE erythema/cellulitis:  - Started doxy 4/20. Resolved  Continue to ambulate. Awaiting arrival of family/care-givers for discharge teaching.   Graciella Freer, PA-C  05/22/2017 8:48 AM   Advanced Heart Failure Team Pager [phone] (M-F; 7a - 4p)  Please contact [facility] for night-coverage after hours (4p -7a ) and weekends on [email or web address]  Patient seen and examined with the above-signed Advanced Practice Provider and/or Housestaff. I personally reviewed laboratory data, imaging studies and relevant notes. I independently examined the patient and formulated the important aspects of the plan. I have edited the note to reflect any of my changes or salient points. I have personally discussed the plan with the patient and/or family.  Continues to progress. Remains on inotropic therapy. Co-ox 62%. Will stop milrinone today and follow closely. Volumes status ok (despite inaccurate weight). Rhythm stable. Continue amio. INR 2.0. Will transfer to [facility]. VAD interrogated personally. Parameters stable.  [name], MD  9:28 AM

## 2017-05-23 ENCOUNTER — Inpatient Hospital Stay (HOSPITAL_COMMUNITY): Payer: Medicaid Other

## 2017-05-23 LAB — COMPREHENSIVE METABOLIC PANEL
ALT: 13 U/L — ABNORMAL LOW (ref 17–63)
AST: 21 U/L (ref 15–41)
Albumin: 2.1 g/dL — ABNORMAL LOW (ref 3.5–5.0)
Alkaline Phosphatase: 141 U/L — ABNORMAL HIGH (ref 38–126)
Anion gap: 10 (ref 5–15)
BUN: 21 mg/dL — ABNORMAL HIGH (ref 6–20)
CO2: 26 mmol/L (ref 22–32)
Calcium: 8.6 mg/dL — ABNORMAL LOW (ref 8.9–10.3)
Chloride: 93 mmol/L — ABNORMAL LOW (ref 101–111)
Creatinine, Ser: 1.06 mg/dL (ref 0.61–1.24)
GFR calc Af Amer: >60 mL/min (ref >60)
GFR calc non Af Amer: >60 mL/min (ref >60)
Glucose, Bld: 169 mg/dL — ABNORMAL HIGH (ref 65–99)
Potassium: 4.1 mmol/L (ref 3.5–5.1)
Sodium: 129 mmol/L — ABNORMAL LOW (ref 135–145)
Total Bilirubin: 1.2 mg/dL (ref 0.3–1.2)
Total Protein: 7 g/dL (ref 6.5–8.1)

## 2017-05-23 LAB — LACTATE DEHYDROGENASE: LDH: 278 U/L — ABNORMAL HIGH (ref 98–192)

## 2017-05-23 LAB — CBC
HCT: 27.8 % — ABNORMAL LOW (ref 39.0–52.0)
Hemoglobin: 9.1 g/dL — ABNORMAL LOW (ref 13.0–17.0)
MCH: 27.4 pg (ref 26.0–34.0)
MCHC: 32.7 g/dL (ref 30.0–36.0)
MCV: 83.7 fL (ref 78.0–100.0)
Platelets: 349 10*3/uL (ref 150–400)
RBC: 3.32 MIL/uL — ABNORMAL LOW (ref 4.22–5.81)
RDW: 15.9 % — ABNORMAL HIGH (ref 11.5–15.5)
WBC: 19.6 10*3/uL — ABNORMAL HIGH (ref 4.0–10.5)

## 2017-05-23 LAB — GLUCOSE, CAPILLARY
Glucose-Capillary: 195 mg/dL — ABNORMAL HIGH (ref 65–99)
Glucose-Capillary: 161 mg/dL — ABNORMAL HIGH (ref 65–99)
Glucose-Capillary: 136 mg/dL — ABNORMAL HIGH (ref 65–99)

## 2017-05-23 LAB — PROTIME-INR
INR: 2.01
Prothrombin Time: 22.6 seconds — ABNORMAL HIGH (ref 11.4–15.2)

## 2017-05-23 LAB — COOXEMETRY PANEL
Carboxyhemoglobin: 1.9 % — ABNORMAL HIGH (ref 0.5–1.5)
Methemoglobin: 0.8 % (ref 0.0–1.5)
O2 Saturation: 56.2 %
Total hemoglobin: 9.7 g/dL — ABNORMAL LOW (ref 12.0–16.0)

## 2017-05-23 MED ORDER — SORBITOL 70 % SOLN
45.0000 mL | Freq: Once | Status: AC
  Administered 2017-05-23: 45 mL via ORAL
  Filled 2017-05-23: qty 60

## 2017-05-23 MED ORDER — VANCOMYCIN HCL IN DEXTROSE 750-5 MG/150ML-% IV SOLN
750.0000 mg | Freq: Two times a day (BID) | INTRAVENOUS | Status: DC
  Administered 2017-05-23 – 2017-05-31 (×16): 750 mg via INTRAVENOUS
  Filled 2017-05-23 (×17): qty 150

## 2017-05-23 MED ORDER — WARFARIN SODIUM 3 MG PO TABS
6.0000 mg | ORAL_TABLET | Freq: Once | ORAL | Status: AC
  Administered 2017-05-23: 6 mg via ORAL
  Filled 2017-05-23: qty 2

## 2017-05-23 NOTE — Progress Notes (Signed)
Pharmacy Antibiotic Note  Benjamin Sherman is a 47 y.o. male admitted on 04/21/2017 with s/p LVAD implant on 4/30.  Pharmacy has been consulted for vancomycin and Zosyn dosing for new fevers and elevated WBC count. Pt previously had supratherapeutic VT on 1000mg  IV q12h, SCr slightly improved since then.  Plan: -Adjust vancomycin to 750mg  IV q12h -Continue Zosyn 3.375g IV EI q8h -Monitor LOT, repeat BCx, renal function -Vancomycin trough at steady state  Height: 6' (182.9 cm) Weight: 198 lb 8 oz (90 kg) IBW/kg (Calculated) : 77.6  Temp (24hrs), Avg:99 F (37.2 C), Min:97.4 F (36.3 C), Max:101.3 F (38.5 C)  Recent Labs  Lab 05/18/17 0823 05/18/17 1703 05/19/17 0325 05/20/17 0501 05/21/17 0453 05/22/17 0403 05/23/17 0521  WBC  --   --  11.7* 9.4 12.3* 16.5* 19.6*  CREATININE  --  0.90 1.13 1.21 1.10 1.09  --   VANCOTROUGH 26*  --   --   --   --   --   --     Estimated Creatinine Clearance: 91 mL/min (by C-G formula based on SCr of 1.09 mg/dL).    Allergies  Allergen Reactions  . Bee Venom     UNSPECIFIED REACTION     Antimicrobials this admission: 4/30 Cefuroxime >> 5/4 4/30 Vancomycin >> 5/4; 5/8>> 5/8 Zosyn >> 5/8 Cefazolin x1 Periop fluconazole/rifampin 5/1  Dose adjustments this admission: 5/3 VT = 26 - reduce to 500mg  IV q12h  Microbiology results: 5/7 BCx: sent  4/29 MRSA PCR: negative  Thank you for allowing pharmacy to be a part of this patient's care.  Fredonia Highland, PharmD, BCPS PGY-2 Cardiology Pharmacy Resident Pager: [phone] 05/23/2017

## 2017-05-23 NOTE — Progress Notes (Signed)
ANTICOAGULATION CONSULT NOTE - Follow-Up Consult  Pharmacy Consult for Warfarin Indication: LVAD  Allergies  Allergen Reactions  . Bee Venom     UNSPECIFIED REACTION     Patient Measurements: Height: 6' (182.9 cm) Weight: 198 lb 8 oz (90 kg) IBW/kg (Calculated) : 77.6 Heparin Dosing Weight: n/a  Vital Signs: Temp: 97.4 F (36.3 C) (05/08 0259) Temp Source: Axillary (05/07 2337) BP: 106/90 (05/08 0300) Pulse Rate: 100 (05/08 0259)  Labs: Recent Labs    05/21/17 0453 05/22/17 0403 05/23/17 0521  HGB 9.6* 9.3* 9.1*  HCT 28.7* 28.3* 27.8*  PLT 315 306 349  LABPROT 22.2* 22.8* 22.6*  INR 1.96 2.04 2.01  CREATININE 1.10 1.09 1.06    Estimated Creatinine Clearance: 93.5 mL/min (by C-G formula based on SCr of 1.06 mg/dL).   Medical History: Past Medical History:  Diagnosis Date  . CHF (congestive heart failure) (HCC)   . Diabetes mellitus without complication (HCC)     Medications:  Scheduled:  . allopurinol  100 mg Oral Daily  . amiodarone  200 mg Oral BID  . aspirin EC  81 mg Oral Daily  . bisacodyl  10 mg Oral Daily   Or  . bisacodyl  10 mg Rectal Daily  . Chlorhexidine Gluconate Cloth  6 each Topical Daily  . clonazePAM  1 mg Oral QHS  . docusate sodium  200 mg Oral Daily  . feeding supplement (PRO-STAT SUGAR FREE 64)  30 mL Oral BID  . furosemide  40 mg Oral BID  . guaiFENesin  600 mg Oral BID  . insulin aspart  0-15 Units Subcutaneous TID WC  . insulin aspart  0-5 Units Subcutaneous QHS  . lactose free nutrition  237 mL Oral TID WC  . pantoprazole  40 mg Oral Daily  . potassium chloride  40 mEq Oral Daily  . sodium chloride flush  10-40 mL Intracatheter Q12H  . Warfarin - Pharmacist Dosing Inpatient   Does not apply q1800    Assessment: 47 yo male s/p LVAD placement 4/30 on warfarin per pharmacy. INR therapeutic today at 2.01, CBC/LDH stable. Mild bleeding from gums overnight s/p dental procedures earlier in admit, resolved this am.  Goal of  Therapy:  INR goal 2-2.5 Monitor platelets by anticoagulation protocol: Yes   Plan:  -Warfarin 6mg  PO x1 tonight -Daily INR  Fredonia Highland, PharmD, BCPS PGY-2 Cardiology Pharmacy Resident Pager: [phone] 05/23/2017

## 2017-05-23 NOTE — Progress Notes (Addendum)
Patient ID: Benjamin Sherman, male   DOB: 24-Nov-1970, 47 y.o.   MRN: [id number]    Advanced Heart Failure Rounding Note  Subjective:    Events:  -Admitted 04/21/17 with cardiogenic shock and atrial flutter.  -Central line placed. Initial co-ox 39%. -IABP placed 04/25/17-> 04/30/17 with persistent cardiogenic shock.  -DCCV atrial flutter 4/12.   -S/p multiple teeth extractions 05/11/17.  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  -Extubated on 5/1   Febrile overnight. Tmax 101.3. BCx pending. Started on vanc/zosyn. WBC up to 19.6. Seen by Dr. Donata Clay and CT chest ordered.   Having some bleeding from his teeth extraction sites, thinks upper right. No purulence. Mild productive cough yesterday. Using IS. Denies lightheadedness or dizziness   Coox 56.2% off milrinone. CVP not connected.   INR 2.01. No bleeding. Hgb stable.   LVAD Interrogation HM 3: Speed: 5400 Flow: 4.1 PI: 4.0 Power: 4.1. Occasional PI events  Objective:   Weight Range:  Vital Signs:   Temp:  [97.4 F (36.3 C)-101.3 F (38.5 C)] 98.1 F (36.7 C) (05/08 0700) Pulse Rate:  [96-106] 100 (05/08 0259) Resp:  [14-38] 30 (05/08 0700) BP: (96-112)/(76-90) 96/76 (05/08 0700) SpO2:  [93 %-100 %] 93 % (05/08 0700) Weight:  [198 lb 8 oz (90 kg)] 198 lb 8 oz (90 kg) (05/08 0551) Last BM Date: 05/19/17  Weight change: Filed Weights   05/21/17 0700 05/22/17 0628 05/23/17 0551  Weight: 200 lb 6.4 oz (90.9 kg) 217 lb 9.5 oz (98.7 kg) 198 lb 8 oz (90 kg)   Intake/Output:   Intake/Output Summary (Last 24 hours) at 05/23/2017 0835 Last data filed at 05/23/2017 0301 Gross per 24 hour  Intake 496.35 ml  Output 1525 ml  Net -1028.65 ml    Physical Exam   GENERAL: Well appearing this am. NAD.  HEENT: Normal.  Left gums with + sutures from teeth extractions. No active bleeding. No obvious signs of infection. Anicteric  NECK: Supple, JVP 7-8 cm. Carotids OK.  CARDIAC:  Mechanical heart sounds with LVAD hum present. Sternal incision with no obvious  signs of infection.  LUNGS:  Diminished left basilar sounds.  No wheeze  ABDOMEN:  NT, ND, no HSM. No bruits or masses. +BS  LVAD exit site: Dressing dry and intact. No erythema or drainage. Stabilization device present and accurately applied. Driveline dressing changed daily per sterile technique.  Extremities: no cyanosis, clubbing, rash, edema Neuro: alert & oriented x 3, cranial nerves grossly intact. moves all 4 extremities w/o difficulty. Affect pleasant   Telemetry    NSR 90s, personally reviewed.   EKG   No new tracings.    Labs    Basic Metabolic Panel: Recent Labs  Lab 05/17/17 0428  05/18/17 0346  05/19/17 0325 05/20/17 0501 05/21/17 0453 05/22/17 0403 05/23/17 0521  NA 136   < > 134*   < > 135 135 135 131* 129*  K 4.4   < > 3.8   < > 3.5 3.9 4.2 4.5 4.1  CL 103   < > 98*   < > 97* 100* 97* 94* 93*  CO2 24  --  28  --  30 27 27 27 26   GLUCOSE 147*   < > 108*   < > 87 107* 122* 150* 169*  BUN 22*   < > 22*   < > 25* 25* 22* 19 21*  CREATININE 1.36*   < > 1.26*   < > 1.13 1.21 1.10 1.09 1.06  CALCIUM 8.8*  --  8.9  --  8.9 8.5* 9.0 8.9 8.6*  MG 1.9  --  1.7  --  1.7 2.1 1.8 1.8  --   PHOS 3.5  --  3.5  --  3.8  --   --   --   --    < > = values in this interval not displayed.   Liver Function Tests: Recent Labs  Lab 05/19/17 0325 05/20/17 0501 05/21/17 0453 05/22/17 0403 05/23/17 0521  AST 26 25 27 22 21   ALT 13* 14* 16* 14* 13*  ALKPHOS 64 80 106 130* 141*  BILITOT 2.2* 1.6* 1.4* 1.4* 1.2  PROT 6.6 6.5 7.6 7.4 7.0  ALBUMIN 2.5* 2.3* 2.4* 2.3* 2.1*   No results for input(s): LIPASE, AMYLASE in the last 168 hours. No results for input(s): AMMONIA in the last 168 hours.  CBC: Recent Labs  Lab 05/18/17 0346  05/19/17 0325 05/20/17 0501 05/21/17 0453 05/22/17 0403 05/23/17 0521  WBC 15.5*  --  11.7* 9.4 12.3* 16.5* 19.6*  NEUTROABS 14.1*  --  9.1* 6.7 8.9* 12.9*  --   HGB 8.9*   < > 8.9* 8.3* 9.6* 9.3* 9.1*  HCT 27.6*   < > 27.2* 25.8* 28.7*  28.3* 27.8*  MCV 85.4  --  85.3 84.6 84.4 84.0 83.7  PLT 153  --  186 213 315 306 349   < > = values in this interval not displayed.   Cardiac Enzymes: No results for input(s): CKTOTAL, CKMB, CKMBINDEX, TROPONINI in the last 168 hours. BNP: BNP (last 3 results) Recent Labs    04/21/17 1212 05/16/17 0529 05/22/17 0403  BNP 483.0* 262.3* 221.3*   ProBNP (last 3 results) No results for input(s): PROBNP in the last 8760 hours.  Other results:  Imaging: No results found.  Medications:    Scheduled Medications: . allopurinol  100 mg Oral Daily  . amiodarone  200 mg Oral BID  . aspirin EC  81 mg Oral Daily  . bisacodyl  10 mg Oral Daily   Or  . bisacodyl  10 mg Rectal Daily  . Chlorhexidine Gluconate Cloth  6 each Topical Daily  . clonazePAM  1 mg Oral QHS  . docusate sodium  200 mg Oral Daily  . feeding supplement (PRO-STAT SUGAR FREE 64)  30 mL Oral BID  . furosemide  40 mg Oral BID  . guaiFENesin  600 mg Oral BID  . insulin aspart  0-15 Units Subcutaneous TID WC  . insulin aspart  0-5 Units Subcutaneous QHS  . lactose free nutrition  237 mL Oral TID WC  . pantoprazole  40 mg Oral Daily  . potassium chloride  40 mEq Oral Daily  . sodium chloride flush  10-40 mL Intracatheter Q12H  . Warfarin - Pharmacist Dosing Inpatient   Does not apply q1800    Infusions: . sodium chloride    . lactated ringers Stopped (05/22/17 0926)  . piperacillin-tazobactam (ZOSYN)  IV 3.375 g (05/23/17 0650)  . vancomycin      PRN Medications:    Assessment:   Benjamin Sherman is a 47 y.o. 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 eosinophilic cardiomyopathy, LV thrombus, hypothyroidism, DM2, CKD Stage II-III and traumatic [facility] 11/18 after fall.   Admitted with cardiogenic shock.  Plan/Discussion:    1. S/P HMIII 4/30 under DT - LDH 278.  - Continue ASA 81 daily.  - Warfarin for goal INR 2-2.5 => 2.01 2. Acute on chronic systolic HF ->  cardiogenic  shock:  Nonischemic cardiomyopathy. ECHO 11/2016 EF 25-30% CMRI EF 22%. Bedside echo in HF clinic EF 20% in 3/19.  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 Did not respond to steroids. Admitted 4/6 with cardiogenic shock and volume overload co-ox 38%. Echo 04/23/17: LVEF 20-25% with large LV clot, RV mild to moderately down.   - Coox 57% off milrinone. Follow.  - Continue lasix 40 mg po BID.  3. Atrial flutter: New onset. S/p DCCV to NSR on 4/12.  - Maintaining NSR on oral amio.  4. AKI CKD Stage II-III: Likely ATN due to shock.   - Stable.  5. H/o LV Thrombus - now recurrent on echo 04/23/17 - No change to current plan.   6. DMII - Per TCTS.  - No change to current plan.   7. Hypothyroidism:  - TFTs OK 04/22/17. TSH elevated at 10.848 4/23. T4 and T3 normal.  - No change to current plan.   8. RLE erythema/cellulitis:  - Started doxy 4/20. Resolved.  9. ID - With fevers overnight. Tmax 101.3 - Covering with vanc/zosyn. - Driveline OK. CT chest pending. Sternotomy looks stable. Teeth extractions with some bleeding overnight. No purulence or obvious infection on this providers exam. Some productive cough yesterday.  - BCx pending.  - Encouraged IS.   Continue to ambulate. Encouraged IS. Covering with ABX with fever overnight. Cultures and imaging pending.   Graciella Freer, PA-C  05/23/2017 8:35 AM   Advanced Heart Failure Team Pager [phone] (M-F; 7a - 4p)  Please contact [facility] for night-coverage after hours (4p -7a ) and weekends on [email or web address]  Patient seen and examined with the above-signed Advanced Practice Provider and/or Housestaff. I personally reviewed laboratory data, imaging studies and relevant notes. I independently examined the patient and formulated the important aspects of the plan. I have edited the note to reflect any of my changes or salient points. I have personally discussed the plan with the patient and/or family.  Febrile overnight. Bcx  drawn. Vanc/zosyn started. AF now but WBC up. CT chest reviewed personally. Small left effusion. No PNA. UA pending. Co-ox and CVP  look good off milrinone. INR 2.01. Had some oral bleeding last night but now resolved. Discussed dosing with PharmD personally. VAD interrogated personally. Parameters stable.  Will continue broad spectrum abx. Review CT with Dr. Donata Clay. Continue to ambulate. Driveline site ok.   [name], MD  3:16 PM

## 2017-05-23 NOTE — Plan of Care (Signed)
Patient continues to progress toward care goals.  Patient is managing his own LVAD change from batteries to [facility] power lines without difficulty.  This AM he ambulated to the bedside sink, bathed himself, and shaved while sitting in a chair.  Patient ambulated w/PT as well.  Patient w/temp overnight to 103 - blood cx drawn; ATBs initiated afterward.  Temp down today.  CT chest completed - see result.  Await urinalysis.  Will continue to monitor.

## 2017-05-23 NOTE — Progress Notes (Addendum)
Nutrition Follow-up  DOCUMENTATION CODES:   Not applicable  INTERVENTION:   -D/c Boost Plus, due to poor acceptance -Continue 30 ml Prostat BID, each supplement provides 100 kcals and 15 grams protein  NUTRITION DIAGNOSIS:   Increased nutrient needs related to catabolic illness(End stage HF) as evidenced by estimated needs.  Ongoing  GOAL:   Patient will meet greater than or equal to 90% of their needs  Progressing  MONITOR:   PO intake, Supplement acceptance, Labs, Weight trends, I & O's  REASON FOR ASSESSMENT:   Consult Assessment of nutrition requirement/status  ASSESSMENT:   47 y/o make PMHx CHF, DM2, SAH, CKD2-3. Patient had been diagnosed with HF (ef 15%) end of last year. Presented to [facility] 4/6 w/ SOB and BLE edema x 2.5 days.  Worked up for Cardiogenic shock in setting of decompensated, worsening heart failure. He is not a transplant candidate and poor candidate for LVAD due to social situation & RV dysfunction. VAD team evaluating. Options are hospice vs high risk VAD. RD consulted for nutrition evaluation for potential LVAD.   4/26 Multiple teeth extractions 4/30 HeartMate III placed 5/1 Extubated 5/3- one chest tube removed 5/7- transferred from ICU to SDU  Reviewed I/O's: -1 L x 24 hours and -1.9 L since 05/09/17.   Pt in with MD and receiving nursing care at times of visits.   Pt with great appetite; noted meal completion 80-100%. Pt is taking Prostat supplements, however, is refusing Boost Plus supplement added on 05/18/17.   Per LVAD coordinator note, planning to schedule meeting with caregivers for education.   Labs reviewed: Na: 129, CBGS: 195 (inpatient orders for glycemic control are 0-15 units insulin aspart TID with meals and 0-5 units insulin aspart q HS).   Diet Order:   Diet Order           DIET SOFT Room service appropriate? Yes; Fluid consistency: Thin  Diet effective now          EDUCATION NEEDS:   Education needs have been  addressed  Skin:  Skin Assessment: Skin Integrity Issues: Skin Integrity Issues:: Incisions Incisions: surgical: abdomen, chest  Last BM:  05/19/17  Height:   Ht Readings from Last 1 Encounters:  05/22/17 6' (1.829 m)    Weight:   Wt Readings from Last 1 Encounters:  05/23/17 198 lb 8 oz (90 kg)    Ideal Body Weight:  80.91 kg  BMI:  Body mass index is 26.92 kg/m.  Estimated Nutritional Needs:   Kcal:  2200-2400  Protein:  115-130 grams  Fluid:  2.2-2.4 L    Elena Cothern A. Mayford Knife, RD, LDN, CDE Pager: [phone] After hours Pager: [phone]

## 2017-05-23 NOTE — Progress Notes (Signed)
LVAD Coordinator Rounding Note:  Admitted 04/21/17 with cardiogenic shock and atrial flutter.   HM III LVAD implanted on 05/15/17 by Dr. Maren Beach  under Destination Therapy criteria due to limited social support.   Pt sitting up in the chair, not feeling "as good" today as yesterday. Spiked temp last night, BCs, and antibiotics started. Dr. Maren Beach has ordered CT scan today.  Vital signs: Temp:  101.3 max HR: 101 Auto BP: 91/63 (74) Doppler: 82 O2 Sat:  % on RA Wt: 195>211>212>208>205>207>200>217(inaccurate wt)>198 lbs   LVAD interrogation reveals:  Speed: 5400 Flow: 4.3 Power:  4.0w PI:  4.3 Alarms: none Events: none Hematocrit: 28 Fixed speed: 5400 Low speed limit: 5100  Labs:  LDH trend: 253>317>287>263>244>265>265>278  INR trend: 1.24>1.68>2.19>2.29>2.12>1.96>2.04>2.01  WBC trend: 16.5>16.1>15.5>11.7>12.3>16.5>19.6  Anticoagulation Plan: -INR Goal: 2.0 - 2.5  -ASA Dose: 81 mg (started 05/18/17 with therapeutic INR)  Blood Products:   Intra op: 05/15/17 - 1 unit plts - 1 unit FFP  Post op:  - 05/15/17>1 RBC, 2 FFP, DDAVP, Factor VII -05/16/17>1 PC  Device: N/A  Arrythmias: increased ectopy, short run Afib 05/15/17 - Amiodarone started 30 mg/hr-switched to PO today  Respiratory: extubated 05/16/17  Nitric Oxide: off 05/16/17  Gtts: Milrinone> stop 05/22/17 Levo>8 mcg/min-off 5/2 Epi>1 mcg/min-off 5/3 Amiodarone>30 mg/hr-off 5/2 Lasix 6 mg/hr-off 5/3  Infection: - BC drawn 05/22/17>pending - Vanc and Zosyn started 05/22/17 for temp 101.3 - CT scan 5/8/9 to r/o infection  Adverse Events on VAD: -none  VAD Education:  Discharge planning initiated with patient this am and covered the following: 1. Confirmed with Gene pt's new home address and home is on [address].  2. Gene unable to come today for dressing and home discharge training.  3. Pt changed power source from batteries to PM today. 4. VAD home equipment ordered.  Plan/Recommendations:  1. VAD  Coordinator observed Tammy (bedside nurse) perform dressing change today. She is now     checked off as VAD Alla Feeling.  2. Call VAD pager if any questions re: VAD equipment or drive line site/care.  [name] RN, VAD Coordinator 24/7 VAD pager: [phone]

## 2017-05-23 NOTE — Progress Notes (Deleted)
ANTICOAGULATION CONSULT NOTE - Follow-Up Consult  Pharmacy Consult for Warfarin Indication: LVAD  Allergies  Allergen Reactions  . Bee Venom     UNSPECIFIED REACTION     Patient Measurements: Height: 6' (182.9 cm) Weight: 198 lb 8 oz (90 kg) IBW/kg (Calculated) : 77.6 Heparin Dosing Weight: n/a  Vital Signs: Temp: 98.1 F (36.7 C) (05/08 0700) Temp Source: Oral (05/08 0700) BP: 96/76 (05/08 0700) Pulse Rate: 100 (05/08 0259)  Labs: Recent Labs    05/21/17 0453 05/22/17 0403 05/23/17 0521  HGB 9.6* 9.3* 9.1*  HCT 28.7* 28.3* 27.8*  PLT 315 306 349  LABPROT 22.2* 22.8* 22.6*  INR 1.96 2.04 2.01  CREATININE 1.10 1.09 1.06    Estimated Creatinine Clearance: 93.5 mL/min (by C-G formula based on SCr of 1.06 mg/dL).   Medical History: Past Medical History:  Diagnosis Date  . CHF (congestive heart failure) (HCC)   . Diabetes mellitus without complication (HCC)     Medications:  Scheduled:  . allopurinol  100 mg Oral Daily  . amiodarone  200 mg Oral BID  . aspirin EC  81 mg Oral Daily  . bisacodyl  10 mg Oral Daily   Or  . bisacodyl  10 mg Rectal Daily  . Chlorhexidine Gluconate Cloth  6 each Topical Daily  . clonazePAM  1 mg Oral QHS  . docusate sodium  200 mg Oral Daily  . feeding supplement (PRO-STAT SUGAR FREE 64)  30 mL Oral BID  . furosemide  40 mg Oral BID  . guaiFENesin  600 mg Oral BID  . insulin aspart  0-15 Units Subcutaneous TID WC  . insulin aspart  0-5 Units Subcutaneous QHS  . lactose free nutrition  237 mL Oral TID WC  . pantoprazole  40 mg Oral Daily  . potassium chloride  40 mEq Oral Daily  . sodium chloride flush  10-40 mL Intracatheter Q12H  . Warfarin - Pharmacist Dosing Inpatient   Does not apply q1800    Assessment: 47 yo male s/p LVAD placement 4/30 on warfarin per pharmacy. INR therapeutic today at 2.01, CBC/LDH stable.  Goal of Therapy:  INR goal 2-2.5 Monitor platelets by anticoagulation protocol: Yes   Plan:  -Warfarin  6 PO x1 tonight -Daily INR  Fredonia Highland, PharmD, BCPS PGY-2 Cardiology Pharmacy Resident Pager: [phone] 05/23/2017

## 2017-05-23 NOTE — Progress Notes (Signed)
Occupational Therapy Treatment Patient Details Name: Benjamin Sherman MRN: [id number] DOB: 27-Jul-1970 Today's Date: 05/23/2017    History of present illness 47 yo admitted with cardiogenic shock, s/p RHC and IABP insertion with continued shock 4/29 , teeth extraction 4/26, HM111 4/30, extubated 5/1. PMHx: HF, cardiomyopathy, LV thrombus, hypothyroidism, DM, CKD, traumatic [facility] 11/2016   OT comments  Pt progressing towards OT goals this session. Pt continues to require increased time for power change over. He struggles with aligning the moons, and continues to require max A to Pharmacologist. Pt able to report everything he needs in his black back up bag. Pt continues to benefit from skilled OT in the acute setting. Next session, establish HEP for fine motor to improve function/dexterity for power change over and continue to reinforce sternal precautions in functional ADL setting.   Follow Up Recommendations  No OT follow up;Supervision/Assistance - 24 hour    Equipment Recommendations  3 in 1 bedside commode    Recommendations for Other Services      Precautions / Restrictions Precautions Precautions: Sternal Precaution Booklet Issued: Yes (comment) Precaution Comments: LVAD Restrictions Weight Bearing Restrictions: Yes Other Position/Activity Restrictions: sternal       Mobility Bed Mobility               General bed mobility comments: in chair on arrival   Transfers Overall transfer level: Needs assistance Equipment used: Rolling walker (2 wheeled) Transfers: Sit to/from Stand Sit to Stand: Min guard         General transfer comment: Pt with good hand placement on thighs during transfer.     Balance Overall balance assessment: Needs assistance Sitting-balance support: No upper extremity supported;Feet supported Sitting balance-Leahy Scale: Good     Standing balance support: Bilateral upper extremity supported Standing balance-Leahy Scale: Fair                              ADL either performed or assessed with clinical judgement   ADL Overall ADL's : Needs assistance/impaired                 Upper Body Dressing : Maximal assistance;Sitting Upper Body Dressing Details (indicate cue type and reason): to don holster Lower Body Dressing: Moderate assistance;Sit to/from stand Lower Body Dressing Details (indicate cue type and reason): Pt can cross his legs to get to his feet for socks and shoes, requires BUE support for sit to stand and will require assist for pants etc Toilet Transfer: Min guard;Ambulation;RW Toilet Transfer Details (indicate cue type and reason): vc for safe hand placement Toileting- Clothing Manipulation and Hygiene: Maximal assistance;Sit to/from stand Toileting - Clothing Manipulation Details (indicate cue type and reason): education for sternal precautions during rear peri care     Functional mobility during ADLs: Min guard;Rolling walker General ADL Comments: Pt continues to struggle with power change over (aligning the "moon") as well as donning the holster     Vision       Perception     Praxis      Cognition Arousal/Alertness: Awake/alert Behavior During Therapy: WFL for tasks assessed/performed Overall Cognitive Status: Impaired/Different from baseline Area of Impairment: Memory                     Memory: Decreased recall of precautions       Problem Solving: Slow processing General Comments: pt slow with min cueing for switching from wall to battery LVAD  Exercises     Shoulder Instructions       General Comments Reviewed sternal precautions with patient and reminder for what items to bring in black bag.     Pertinent Vitals/ Pain       Pain Assessment: No/denies pain  Home Living                                          Prior Functioning/Environment              Frequency  Min 2X/week        Progress Toward Goals  OT  Goals(current goals can now be found in the care plan section)  Progress towards OT goals: Progressing toward goals  Acute Rehab OT Goals Patient Stated Goal: return to playing golf OT Goal Formulation: With patient/family Time For Goal Achievement: 06/01/17 Potential to Achieve Goals: Good  Plan Discharge plan remains appropriate    Co-evaluation    PT/OT/SLP Co-Evaluation/Treatment: Yes Reason for Co-Treatment: Complexity of the patient's impairments (multi-system involvement);For patient/therapist safety;Other (comment)(LVAD) PT goals addressed during session: Mobility/safety with mobility;Balance;Proper use of DME OT goals addressed during session: ADL's and self-care;Proper use of Adaptive equipment and DME      AM-PAC PT "6 Clicks" Daily Activity     Outcome Measure   Help from another person eating meals?: None Help from another person taking care of personal grooming?: A Little Help from another person toileting, which includes using toliet, bedpan, or urinal?: A Little Help from another person bathing (including washing, rinsing, drying)?: A Little Help from another person to put on and taking off regular upper body clothing?: A Lot Help from another person to put on and taking off regular lower body clothing?: A Little 6 Click Score: 18    End of Session Equipment Utilized During Treatment: Gait belt;Rolling walker  OT Visit Diagnosis: Unsteadiness on feet (R26.81);Other (comment)   Activity Tolerance Patient tolerated treatment well   Patient Left in chair;with call bell/phone within reach   Nurse Communication Mobility status        Time: 7034-0352 OT Time Calculation (min): 38 min  Charges: OT General Charges $OT Visit: 1 Visit OT Treatments $Self Care/Home Management : 8-22 mins $Therapeutic Activity: 8-22 mins  Sherryl Manges OTR/L [phone]   Evern Bio Emmagene Ortner 05/23/2017, 5:16 PM

## 2017-05-23 NOTE — Progress Notes (Signed)
Existing VAD dressing removed and site care performed using sterile technique. Drive line exit site cleaned with Chlora prep applicators x 2, allowed to dry, and gauze dressing with silver strip re-applied. Exit site with no tissue ingrowth, one suture intact, the velour is fully implanted at exit site. Some abdominal tenderness with cleansing, small amount bloody drainage noted; no foul odor or rash noted.

## 2017-05-23 NOTE — Progress Notes (Signed)
Physical Therapy Treatment Patient Details Name: Benjamin Sherman MRN: [id number] DOB: February 12, 1970 Today's Date: 05/23/2017    History of Present Illness 47 yo admitted with cardiogenic shock, s/p RHC and IABP insertion with continued shock 4/29 , teeth extraction 4/26, HM111 4/30, extubated 5/1. PMHx: HF, cardiomyopathy, LV thrombus, hypothyroidism, DM, CKD, traumatic [facility] 11/2016    PT Comments    Patient increasing independence with ambulation today, supervision with intermittent min guard for 500', utilizing RW without path deviation. Reviewed sternal precautions and still requring minimal cueing for transferring from wall to battery. HHPT recs still appropriate, will continue to progress as tolerated.    Follow Up Recommendations  Home health PT;Supervision/Assistance - 24 hour     Equipment Recommendations  Rolling walker with 5" wheels;3in1 (PT)    Recommendations for Other Services       Precautions / Restrictions Precautions Precautions: Sternal Precaution Booklet Issued: Yes (comment) Precaution Comments: LVAD Restrictions Weight Bearing Restrictions: Yes Other Position/Activity Restrictions: sternal    Mobility  Bed Mobility               General bed mobility comments: in chair on arrival   Transfers Overall transfer level: Needs assistance Equipment used: Rolling walker (2 wheeled) Transfers: Sit to/from Stand Sit to Stand: Min guard         General transfer comment: Pt with good hand placement on thighs during transfer.   Ambulation/Gait Ambulation/Gait assistance: Min guard;Supervision Ambulation Distance (Feet): 500 Feet Assistive device: Rolling walker (2 wheeled) Gait Pattern/deviations: Step-through pattern;Decreased stride length Gait velocity: decreased   General Gait Details: Patient progressing to supervision with intermittent guard. cues for breathing technique towards end of session as became became lightly dyspenic. no overt path  deviation noted this visit.     Stairs             Wheelchair Mobility    Modified Rankin (Stroke Patients Only)       Balance Overall balance assessment: Needs assistance Sitting-balance support: No upper extremity supported;Feet supported Sitting balance-Leahy Scale: Good     Standing balance support: Bilateral upper extremity supported Standing balance-Leahy Scale: Fair                              Cognition Arousal/Alertness: Awake/alert Behavior During Therapy: WFL for tasks assessed/performed Overall Cognitive Status: Impaired/Different from baseline Area of Impairment: Memory                     Memory: Decreased recall of precautions       Problem Solving: Slow processing General Comments: pt slow with min cueing for switching from wall to battery LVAD      Exercises      General Comments General comments (skin integrity, edema, etc.): Reviewed sternal precautions with patient and reminder for what items to bring in black bag.       Pertinent Vitals/Pain Pain Assessment: No/denies pain    Home Living                      Prior Function            PT Goals (current goals can now be found in the care plan section) Acute Rehab PT Goals Patient Stated Goal: return to playing golf PT Goal Formulation: With patient/family Time For Goal Achievement: 06/01/17 Potential to Achieve Goals: Good Progress towards PT goals: Progressing toward goals    Frequency  Min 3X/week      PT Plan Current plan remains appropriate;Frequency needs to be updated    Co-evaluation PT/OT/SLP Co-Evaluation/Treatment: Yes Reason for Co-Treatment: Complexity of the patient's impairments (multi-system involvement);To address functional/ADL transfers PT goals addressed during session: Mobility/safety with mobility;Balance;Proper use of DME;Strengthening/ROM        AM-PAC PT "6 Clicks" Daily Activity  Outcome Measure   Difficulty turning over in bed (including adjusting bedclothes, sheets and blankets)?: A Little Difficulty moving from lying on back to sitting on the side of the bed? : A Little Difficulty sitting down on and standing up from a chair with arms (e.g., wheelchair, bedside commode, etc,.)?: A Little Help needed moving to and from a bed to chair (including a wheelchair)?: A Little Help needed walking in hospital room?: A Little Help needed climbing 3-5 steps with a railing? : A Little 6 Click Score: 18    End of Session Equipment Utilized During Treatment: Gait belt Activity Tolerance: Patient tolerated treatment well Patient left: in chair;with call bell/phone within reach;with nursing/sitter in room Nurse Communication: Mobility status;Precautions PT Visit Diagnosis: Other abnormalities of gait and mobility (R26.89)     Time: 1610-9604 PT Time Calculation (min) (ACUTE ONLY): 35 min  Charges:  $Gait Training: 8-22 mins                    G Codes:       Etta Grandchild, PT, DPT Acute Rehab Services Pager: [phone]     Etta Grandchild 05/23/2017, 2:52 PM

## 2017-05-24 LAB — COMPREHENSIVE METABOLIC PANEL
ALT: 13 U/L — ABNORMAL LOW (ref 17–63)
AST: 21 U/L (ref 15–41)
Albumin: 2.1 g/dL — ABNORMAL LOW (ref 3.5–5.0)
Alkaline Phosphatase: 131 U/L — ABNORMAL HIGH (ref 38–126)
Anion gap: 10 (ref 5–15)
BUN: 26 mg/dL — ABNORMAL HIGH (ref 6–20)
CO2: 27 mmol/L (ref 22–32)
Calcium: 8.8 mg/dL — ABNORMAL LOW (ref 8.9–10.3)
Chloride: 94 mmol/L — ABNORMAL LOW (ref 101–111)
Creatinine, Ser: 1.2 mg/dL (ref 0.61–1.24)
GFR calc Af Amer: >60 mL/min (ref >60)
GFR calc non Af Amer: >60 mL/min (ref >60)
Glucose, Bld: 133 mg/dL — ABNORMAL HIGH (ref 65–99)
Potassium: 3.8 mmol/L (ref 3.5–5.1)
Sodium: 131 mmol/L — ABNORMAL LOW (ref 135–145)
Total Bilirubin: 1.6 mg/dL — ABNORMAL HIGH (ref 0.3–1.2)
Total Protein: 7.1 g/dL (ref 6.5–8.1)

## 2017-05-24 LAB — PROTIME-INR
INR: 2.08
Prothrombin Time: 23.2 seconds — ABNORMAL HIGH (ref 11.4–15.2)

## 2017-05-24 LAB — MAGNESIUM: Magnesium: 1.9 mg/dL (ref 1.7–2.4)

## 2017-05-24 LAB — CBC
HCT: 26.2 % — ABNORMAL LOW (ref 39.0–52.0)
Hemoglobin: 8.7 g/dL — ABNORMAL LOW (ref 13.0–17.0)
MCH: 28.1 pg (ref 26.0–34.0)
MCHC: 33.2 g/dL (ref 30.0–36.0)
MCV: 84.5 fL (ref 78.0–100.0)
Platelets: 380 10*3/uL (ref 150–400)
RBC: 3.1 MIL/uL — ABNORMAL LOW (ref 4.22–5.81)
RDW: 16.1 % — ABNORMAL HIGH (ref 11.5–15.5)
WBC: 17 10*3/uL — ABNORMAL HIGH (ref 4.0–10.5)

## 2017-05-24 LAB — GLUCOSE, CAPILLARY
Glucose-Capillary: 139 mg/dL — ABNORMAL HIGH (ref 65–99)
Glucose-Capillary: 109 mg/dL — ABNORMAL HIGH (ref 65–99)
Glucose-Capillary: 204 mg/dL — ABNORMAL HIGH (ref 65–99)
Glucose-Capillary: 156 mg/dL — ABNORMAL HIGH (ref 65–99)

## 2017-05-24 LAB — COOXEMETRY PANEL
Carboxyhemoglobin: 1.5 % (ref 0.5–1.5)
Methemoglobin: 1.4 % (ref 0.0–1.5)
O2 Saturation: 51.4 %
Total hemoglobin: 8.6 g/dL — ABNORMAL LOW (ref 12.0–16.0)

## 2017-05-24 LAB — LACTATE DEHYDROGENASE: LDH: 288 U/L — ABNORMAL HIGH (ref 98–192)

## 2017-05-24 MED ORDER — WARFARIN SODIUM 4 MG PO TABS
4.0000 mg | ORAL_TABLET | Freq: Once | ORAL | Status: AC
  Administered 2017-05-24: 4 mg via ORAL
  Filled 2017-05-24: qty 1

## 2017-05-24 NOTE — Progress Notes (Signed)
Palliative:  I went to check on Benjamin Sherman but sleeping in recliner. I did not awaken today.   No charge  Yong Channel, NP Palliative Medicine Team Pager # [phone] (M-F 8a-5p) Team Phone # [phone] (Nights/Weekends)

## 2017-05-24 NOTE — Progress Notes (Addendum)
Patient ID: Markice Halliwell, male   DOB: 1970/03/03, 47 y.o.   MRN: [id number]    Advanced Heart Failure Rounding Note  Subjective:    Events:  -Admitted 04/21/17 with cardiogenic shock and atrial flutter.  -Central line placed. Initial co-ox 39%. -IABP placed 04/25/17-> 04/30/17 with persistent cardiogenic shock.  -DCCV atrial flutter 4/12.   -S/p multiple teeth extractions 05/11/17.  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  -Extubated on 5/1   CT Chest 05/23/17 resolution of R pleural effusion. Small left pleural effusion with suggestion of small loculated component over the lateral left upper lobe. Bibasilar linear atelectasis.   Tmax 100.5 . BCx NGTD. Remains on vanc/zosyn. WBC 19.6 -> 17.   Feeling OK this am. Appetite improving. Still awaiting primary caregiver to come for discharge education and dressing change. No dizziness, orthopnea or PND. Oral bleeding resolved. Hgb down to 8.7. No sputum.   Coox 51.4% off milrinone.   INR 2.08 Hgb 8.7.    LVAD Interrogation HM 3: Speed: 5400 Flow: 4.6 PI: 3.2 Power: 4.0. Occasional PI events   Objective:   Weight Range:  Vital Signs:   Temp:  [98.5 F (36.9 C)-100.5 F (38.1 C)] 98.9 F (37.2 C) (05/09 0720) Pulse Rate:  [49-74] 49 (05/09 0800) Resp:  [18-28] 25 (05/09 0800) BP: (81-114)/(50-86) 82/58 (05/09 0749) SpO2:  [92 %-100 %] 98 % (05/09 0800) Weight:  [199 lb 12.8 oz (90.6 kg)] 199 lb 12.8 oz (90.6 kg) (05/09 0509) Last BM Date: 05/23/17  Weight change: Filed Weights   05/22/17 0628 05/23/17 0551 05/24/17 0509  Weight: 217 lb 9.5 oz (98.7 kg) 198 lb 8 oz (90 kg) 199 lb 12.8 oz (90.6 kg)   Intake/Output:   Intake/Output Summary (Last 24 hours) at 05/24/2017 1200 Last data filed at 05/24/2017 0900 Gross per 24 hour  Intake 870 ml  Output 740 ml  Net 130 ml    Physical Exam   GENERAL: Seated in chair. NAD.  HEENT: Normal. + Sutures from teeth extractions. anicteric NECK: Supple, JVP 7-8 cm. Carotids OK.  CARDIAC:  Mechanical heart sounds  with LVAD hum present.  LUNGS:  Diminished left basilar sounds.  No wheeze ABDOMEN:  NT, ND, no HSM. No bruits or masses. +BS  LVAD exit site: Dressing dry and intact. No erythema or drainage. Stabilization device present and accurately applied. Driveline dressing changed daily per sterile technique. Extremities: no cyanosis, clubbing, rash, edema Neuro: alert & oriented x 3, cranial nerves grossly intact. moves all 4 extremities w/o difficulty. Affect pleasant  Telemetry    NSR 80-90s, personally reviewed.   EKG   No new tracings.    Labs    Basic Metabolic Panel: Recent Labs  Lab 05/18/17 0346  05/19/17 0325 05/20/17 0501 05/21/17 0453 05/22/17 0403 05/23/17 0521 05/24/17 0416  NA 134*   < > 135 135 135 131* 129* 131*  K 3.8   < > 3.5 3.9 4.2 4.5 4.1 3.8  CL 98*   < > 97* 100* 97* 94* 93* 94*  CO2 28  --  30 27 27 27 26 27   GLUCOSE 108*   < > 87 107* 122* 150* 169* 133*  BUN 22*   < > 25* 25* 22* 19 21* 26*  CREATININE 1.26*   < > 1.13 1.21 1.10 1.09 1.06 1.20  CALCIUM 8.9  --  8.9 8.5* 9.0 8.9 8.6* 8.8*  MG 1.7  --  1.7 2.1 1.8 1.8  --  1.9  PHOS 3.5  --  3.8  --   --   --   --   --    < > =  values in this interval not displayed.   Liver Function Tests: Recent Labs  Lab 05/20/17 0501 05/21/17 0453 05/22/17 0403 05/23/17 0521 05/24/17 0416  AST 25 27 22 21 21   ALT 14* 16* 14* 13* 13*  ALKPHOS 80 106 130* 141* 131*  BILITOT 1.6* 1.4* 1.4* 1.2 1.6*  PROT 6.5 7.6 7.4 7.0 7.1  ALBUMIN 2.3* 2.4* 2.3* 2.1* 2.1*   No results for input(s): LIPASE, AMYLASE in the last 168 hours. No results for input(s): AMMONIA in the last 168 hours.  CBC: Recent Labs  Lab 05/18/17 0346  05/19/17 0325 05/20/17 0501 05/21/17 0453 05/22/17 0403 05/23/17 0521 05/24/17 0416  WBC 15.5*  --  11.7* 9.4 12.3* 16.5* 19.6* 17.0*  NEUTROABS 14.1*  --  9.1* 6.7 8.9* 12.9*  --   --   HGB 8.9*   < > 8.9* 8.3* 9.6* 9.3* 9.1* 8.7*  HCT 27.6*   < > 27.2* 25.8* 28.7* 28.3* 27.8* 26.2*    MCV 85.4  --  85.3 84.6 84.4 84.0 83.7 84.5  PLT 153  --  186 213 315 306 349 380   < > = values in this interval not displayed.   Cardiac Enzymes: No results for input(s): CKTOTAL, CKMB, CKMBINDEX, TROPONINI in the last 168 hours. BNP: BNP (last 3 results) Recent Labs    04/21/17 1212 05/16/17 0529 05/22/17 0403  BNP 483.0* 262.3* 221.3*   ProBNP (last 3 results) No results for input(s): PROBNP in the last 8760 hours.  Other results:  Imaging: Ct Chest Without Contrast  Result Date: 05/23/2017 CLINICAL DATA:  Chest pain and shortness-of-breath with pleuritic pain. Evaluate for effusion. Placement of LVAD 05/15/2017. Thoracic aorta is unremarkable. EXAM: CT CHEST WITHOUT CONTRAST TECHNIQUE: Multidetector CT imaging of the chest was performed following the standard protocol without IV contrast. COMPARISON:  05/04/2017 and 04/23/2017 FINDINGS: Cardiovascular: Interval placement of LVAD. Mild cardiomegaly and new moderate pericardial fluid collection with Hounsfield unit measurements 20. Minimal fluid and air just anterior to the LVAD available postsurgical change. Mediastinum/Nodes: Few shotty mediastinal lymph nodes. No significant hilar adenopathy. Minimal radiopaque material over the mid esophagus likely oral contrast. Remaining mediastinal structures are unremarkable. Lungs/Pleura: Lungs are adequately inflated demonstrate resolution of the previously seen patchy ground-glass nodularity. Persistent linear density over the lung bases likely atelectasis. Resolution of previously seen small right pleural effusion. New small left pleural effusion. Linear atelectasis versus loculated component of pleural fluid over the lateral left upper lobe. Airways are unremarkable. Upper Abdomen: No acute findings. Musculoskeletal: Minimal degenerate change of the spine. IMPRESSION: Interval placement of LVAD. Minimal air and fluid anterior to the LVAD compatible with postsurgical change. This could be an  infected versus noninfected postoperative fluid collection. New moderate size pleural effusion. Resolution of previously seen nodular ground-glass process over the lower lobes. Resolution of right pleural effusion. New small left pleural effusion with suggestion of small loculated component over the lateral left upper lobe. Bibasilar linear atelectasis. Electronically Signed   By: Elberta Fortis M.D.   On: 05/23/2017 15:05    Medications:    Scheduled Medications: . allopurinol  100 mg Oral Daily  . amiodarone  200 mg Oral BID  . aspirin EC  81 mg Oral Daily  . bisacodyl  10 mg Oral Daily   Or  . bisacodyl  10 mg Rectal Daily  . Chlorhexidine Gluconate Cloth  6 each Topical Daily  . clonazePAM  1 mg Oral QHS  . docusate sodium  200 mg Oral Daily  .  feeding supplement (PRO-STAT SUGAR FREE 64)  30 mL Oral BID  . furosemide  40 mg Oral BID  . guaiFENesin  600 mg Oral BID  . insulin aspart  0-15 Units Subcutaneous TID WC  . insulin aspart  0-5 Units Subcutaneous QHS  . pantoprazole  40 mg Oral Daily  . potassium chloride  40 mEq Oral Daily  . sodium chloride flush  10-40 mL Intracatheter Q12H  . warfarin  4 mg Oral ONCE-1800  . Warfarin - Pharmacist Dosing Inpatient   Does not apply q1800    Infusions: . sodium chloride    . lactated ringers Stopped (05/22/17 0926)  . piperacillin-tazobactam (ZOSYN)  IV Stopped (05/24/17 0920)  . vancomycin Stopped (05/24/17 0640)    PRN Medications:    Assessment:   Kierre Hintz is a 47 y.o. 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 eosinophilic cardiomyopathy, LV thrombus, hypothyroidism, DM2, CKD Stage II-III and traumatic [facility] 11/18 after fall.   Admitted with cardiogenic shock.  Plan/Discussion:    1. 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  - Continue ASA 81 daily.  - Warfarin for goal INR 2-2.5 => 2.08 2. Acute on chronic systolic HF -> cardiogenic shock:  Nonischemic cardiomyopathy. ECHO 11/2016 EF  25-30% CMRI EF 22%. Bedside echo in HF clinic EF 20% in 3/19.  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 Did not respond to steroids. Admitted 4/6 with cardiogenic shock and volume overload co-ox 38%. Echo 04/23/17: LVEF 20-25% with large LV clot, RV mild to moderately down.   - Coox 51.4% off milrinone. Follow.  - Continue lasix 40 mg po BID.  3. Atrial flutter: New onset. S/p DCCV to NSR on 4/12.  - Maintaining NSR on oral amio.  4. AKI CKD Stage II-III: Likely ATN due to shock.   - Stable.   5. H/o LV Thrombus - now recurrent on echo 04/23/17 - No change to current plan.   6. DMII - Per TCTS.  - No change to current plan.   7. Hypothyroidism:  - TFTs OK 04/22/17. TSH elevated at 10.848 4/23. T4 and T3 normal.  - No change to current plan.   8. RLE erythema/cellulitis:  - Started doxy 4/20. Resolved.   9. ID - With fevers overnight. Tmax 100.5 - Covering with vanc/zosyn. May be able to narrow to cover lungs. Will discuss with MD.  - CT Chest 05/23/17 resolution of R pleural effusion. Small left pleural effusion with suggestion of small loculated component over the lateral left upper lobe. Bibasilar linear atelectasis.  - BCx NG for 12 hrs.    - Encouraged IS.   Continue to ambulate. Encouraged IS. Covering with ABX with fever overnight. Cultures and imaging pending.   Graciella Freer, PA-C  05/24/2017 12:00 PM   Advanced Heart Failure Team Pager [phone] (M-F; 7a - 4p)  Please contact [facility] for night-coverage after hours (4p -7a ) and weekends on [email or web address]  Patient seen and examined with the above-signed Advanced Practice Provider and/or Housestaff. I personally reviewed laboratory data, imaging studies and relevant notes. I independently examined the patient and formulated the important aspects of the plan. I have edited the note to reflect any of my changes or salient points. I have personally discussed the plan with the patient and/or family.  Now of  milrinone. Co-ox marginal at 51%. Will follow closely. Volume status ok on lasix 40 bid. May need to cut back. Continues with low-grade temps. No  on vanc/cefimpime. WBC coming down. Source of infection unclear. UA pending. HGB down to 8.7 with oral bleeding. Need to follow. VAD interrogated personally. Parameters stable.   Agree with Dr. Donata Clay. Need to keep on IV abx until fevers resolve and WBC down. Cultures remain negative.   VAD interrogated personally. Parameters stable.  [name], MD  3:01 PM

## 2017-05-24 NOTE — Progress Notes (Addendum)
LVAD Coordinator Rounding Note:  Admitted 04/21/17 with cardiogenic shock and atrial flutter.   HM III LVAD implanted on 05/15/17 by Dr. Maren Beach  under Destination Therapy criteria due to limited social support.   Pt sitting up in the chair, not feeling "as good" today as yesterday. Sister at bedside.   Vital signs: Temp:  99.0 max HR: 96 Auto BP:  82/58 (66) Doppler: 82 O2 Sat:  95% on RA Wt: 195>211>212>208>205>207>200>217(inaccurate wt)>198>199 lbs   LVAD interrogation reveals:  Speed: 5400 Flow: 4.6 Power:  4.0w PI:  3.2 Alarms: none Events: occasional PI events Hematocrit: 28 Fixed speed: 5400 Low speed limit: 5100  Drive line site care: Existing VAD dressing removed and site care performed using sterile technique. Drive line exit site cleaned with Chlora prep applicators x 2, allowed to dry, and gauze dressing with silver strip re-applied. Exit site with no tissue ingrowth, one suture intact, the velour is fully implanted at exit site. Increased bloody drainage noted today; no tenderness, foul odor, or rash noted. Pt reports drive line got "pulled on last night" when staff "pulled me up in bed". Anchor intact, velour remains implanted.      Labs:  LDH trend: 253>317>287>263>244>265>265>278  INR trend: 1.24>1.68>2.19>2.29>2.12>1.96>2.04>2.01  WBC trend: 16.5>16.1>15.5>11.7>12.3>16.5>19.6  Anticoagulation Plan: -INR Goal: 2.0 - 2.5  -ASA Dose: 81 mg (started 05/18/17 with therapeutic INR)  Blood Products:   Intra op: 05/15/17 - 1 unit plts - 1 unit FFP  Post op:  - 05/15/17>1 RBC, 2 FFP, DDAVP, Factor VII -05/16/17>1 PC  Device: N/A  Arrythmias: increased ectopy, short run Afib 05/15/17 - Amiodarone started   Respiratory: extubated 05/16/17  Nitric Oxide: off 05/16/17  Gtts: Milrinone> stop 05/22/17 Levo>8 mcg/min-off 5/2 Epi>1 mcg/min-off 5/3 Amiodarone>30 mg/hr-off 5/2 Lasix 6 mg/hr-off 5/3  Infection: - BC drawn 05/22/17>NTD - Vanc and Zosyn started 05/22/17  for temp 101.3 - CT scan 5/8/9 to r/o infection  Adverse Events on VAD: -none  VAD Education:  1. Delivered VAD Patient Binder and reviewed contents with patient and sister [name]). 2. Home Logbook delivered, reviewed with pt how to fill out and ask him to start completing daily. 3. Angel observed dressing change today, explained steps. She says she "won't be doing", but just wanted to observe.  4. Spoke with Gene (primary caregiver) this am, unable to come today, tomorrow, or Saturday. He will check work schedule for next week and call us this afternoon with definite day next week.   Plan/Recommendations:  1. Pt needs to complete reading HM III Handbook and review Discharge Binder.   2. Pt needs to complete Home VAD Logbook daily.  2. Call VAD pager if any questions re: VAD equipment or drive line site/care.  [name] RN, VAD Coordinator 24/7 VAD pager: [phone]

## 2017-05-24 NOTE — Progress Notes (Signed)
CSW met with patient at bedside. Patient's sister was visiting earlier although had just left prior to [facility] arrival. Patient states he is a bit tired after ambulating the halls and just had his dressing changed. Patient states with a smile that he is glad he pursued LVAD implant as he feels so much better. Patient appears to be in good spirits and adjusting to LVAD life. CSW spoke with patient's sister via phone who states she is working on getting home ready for discharge. Sister also requested information on disability and how to follow up with worker. CSW provided information and sister will follow up. CSW continues to follow throughout implant hospitalization. Raquel Sarna, [place], [place]

## 2017-05-24 NOTE — Progress Notes (Signed)
Came earlier to walk but pt eating. Now with dressing change. Will f/u as time allows.  Ethelda Chick CES, ACSM 2:42 PM 05/24/2017

## 2017-05-24 NOTE — Progress Notes (Signed)
ANTICOAGULATION CONSULT NOTE - Follow-Up Consult  Pharmacy Consult for Warfarin Indication: LVAD  Allergies  Allergen Reactions  . Bee Venom     UNSPECIFIED REACTION     Patient Measurements: Height: 6' (182.9 cm) Weight: 199 lb 12.8 oz (90.6 kg) IBW/kg (Calculated) : 77.6 Heparin Dosing Weight: n/a  Vital Signs: Temp: 98.9 F (37.2 C) (05/09 0720) Temp Source: Oral (05/09 0720) BP: 82/58 (05/09 0749) Pulse Rate: 49 (05/09 0800)  Labs: Recent Labs    05/22/17 0403 05/23/17 0521 05/24/17 0416  HGB 9.3* 9.1* 8.7*  HCT 28.3* 27.8* 26.2*  PLT 306 349 380  LABPROT 22.8* 22.6* 23.2*  INR 2.04 2.01 2.08  CREATININE 1.09 1.06 1.20    Estimated Creatinine Clearance: 82.6 mL/min (by C-G formula based on SCr of 1.2 mg/dL).   Medical History: Past Medical History:  Diagnosis Date  . CHF (congestive heart failure) (HCC)   . Diabetes mellitus without complication (HCC)     Medications:  Scheduled:  . allopurinol  100 mg Oral Daily  . amiodarone  200 mg Oral BID  . aspirin EC  81 mg Oral Daily  . bisacodyl  10 mg Oral Daily   Or  . bisacodyl  10 mg Rectal Daily  . Chlorhexidine Gluconate Cloth  6 each Topical Daily  . clonazePAM  1 mg Oral QHS  . docusate sodium  200 mg Oral Daily  . feeding supplement (PRO-STAT SUGAR FREE 64)  30 mL Oral BID  . furosemide  40 mg Oral BID  . guaiFENesin  600 mg Oral BID  . insulin aspart  0-15 Units Subcutaneous TID WC  . insulin aspart  0-5 Units Subcutaneous QHS  . pantoprazole  40 mg Oral Daily  . potassium chloride  40 mEq Oral Daily  . sodium chloride flush  10-40 mL Intracatheter Q12H  . Warfarin - Pharmacist Dosing Inpatient   Does not apply q1800    Assessment: 47 yo male s/p LVAD placement 4/30 on warfarin per pharmacy. INR therapeutic today at 2.08, LDH up slightly. Hgb down slightly, pt denies further bleeding from gums.  Goal of Therapy:  INR goal 2-2.5 Monitor platelets by anticoagulation protocol: Yes    Plan:  -Warfarin 4mg  PO x1 tonight -Daily INR  Fredonia Highland, PharmD, BCPS PGY-2 Cardiology Pharmacy Resident Pager: [phone] 05/24/2017

## 2017-05-24 NOTE — Progress Notes (Signed)
HeartMate 3 Rounding Note POD # 8  Subjective:    47 year old diabetic with nonischemic cardiomyopathy admitted in cardiogenic shock with acute on chronic heart failure, ejection fraction 10%, moderate RV dysfunction, atrial flutter treated with cardioversion to sinus rhythm and mobile thrombus of left ventricular apex.  Patient required inotropic support and balloon pump support prior to completing evaluation for LVAD implantation.  Patient had satisfactory RV function at implantation and required low-dose inotropes.  Pump flow greater than 4 L/min with CVP 10-14.  Sternal closure successful. Preoperative balloon pump removed 6 hours postop.  Patient had significant bleeding from gums at site of previous dental extractions requiring factor replacement, blood transfusion of packed cells, and topical thrombostatic agent on gum line ;[ MRDH].  Now stopped bleeding.  Receiving Peridex rinses.  Patient now in stepdown, off milrinone. Postop fever with moderate leukocytosis now back on vancomycin and Zosyn.  Chest CT scan shows sternotomy and LVAD  site healing.  No significant pleural effusion or pneumonia.  Blood cultures remain negative.  Patient does have open wound in the right maxilla from dental extraction which could be source of fever.  White count and temperature curve improving on IV antibiotics.  Patient's INR is therapeutic and epicardial pacing wires will be pulled today.  Power cord exit site is healing.  LVAD INTERROGATION:  HeartMate 3LVAD:  Flow 4.5  liters/min, speed 5400, power 4.0, PI 2.0.  Controller intact  Objective:    Vital Signs:   Temp:  [98.5 F (36.9 C)-100.5 F (38.1 C)] 99 F (37.2 C) (05/09 1211) Pulse Rate:  [49-74] 49 (05/09 0800) Resp:  [18-28] 25 (05/09 0800) BP: (81-114)/(50-86) 84/68 (05/09 1211) SpO2:  [92 %-100 %] 98 % (05/09 0800) Weight:  [199 lb 12.8 oz (90.6 kg)] 199 lb 12.8 oz (90.6 kg) (05/09 0509) Last BM Date: 05/23/17 Mean arterial Pressure  75-80  Intake/Output:   Intake/Output Summary (Last 24 hours) at 05/24/2017 1253 Last data filed at 05/24/2017 0900 Gross per 24 hour  Intake 870 ml  Output 740 ml  Net 130 ml     Physical Exam: General:  Well appearing.  Breathing comfortably spontaneously HEENT: normal currently no bleeding from mouth Neck: supple. JVP normal. Carotids  no bruits. No lymphadenopathy or thryomegaly appreciated. Cor: Mechanical heart sounds with LVAD hum present. Lungs: clear Abdomen: soft, nontender, nondistended. No hepatosplenomegaly. No bruits or masses. Good bowel sounds. Extremities: Warm, no cyanosis, clubbing, rash, edema Neuro: alert & orientedx3, cranial nerves grossly intact. moves all 4 extremities w/o difficulty. Affect pleasant  Telemetry: Sinus rhythm 94  Labs: Basic Metabolic Panel: Recent Labs  Lab 05/18/17 0346  05/19/17 0325 05/20/17 0501 05/21/17 0453 05/22/17 0403 05/23/17 0521 05/24/17 0416  NA 134*   < > 135 135 135 131* 129* 131*  K 3.8   < > 3.5 3.9 4.2 4.5 4.1 3.8  CL 98*   < > 97* 100* 97* 94* 93* 94*  CO2 28  --  30 27 27 27 26 27   GLUCOSE 108*   < > 87 107* 122* 150* 169* 133*  BUN 22*   < > 25* 25* 22* 19 21* 26*  CREATININE 1.26*   < > 1.13 1.21 1.10 1.09 1.06 1.20  CALCIUM 8.9  --  8.9 8.5* 9.0 8.9 8.6* 8.8*  MG 1.7  --  1.7 2.1 1.8 1.8  --  1.9  PHOS 3.5  --  3.8  --   --   --   --   --    < > =  values in this interval not displayed.    Liver Function Tests: Recent Labs  Lab 05/20/17 0501 05/21/17 0453 05/22/17 0403 05/23/17 0521 05/24/17 0416  AST 25 27 22 21 21   ALT 14* 16* 14* 13* 13*  ALKPHOS 80 106 130* 141* 131*  BILITOT 1.6* 1.4* 1.4* 1.2 1.6*  PROT 6.5 7.6 7.4 7.0 7.1  ALBUMIN 2.3* 2.4* 2.3* 2.1* 2.1*   No results for input(s): LIPASE, AMYLASE in the last 168 hours. No results for input(s): AMMONIA in the last 168 hours.  CBC: Recent Labs  Lab 05/18/17 0346  05/19/17 0325 05/20/17 0501 05/21/17 0453 05/22/17 0403  05/23/17 0521 05/24/17 0416  WBC 15.5*  --  11.7* 9.4 12.3* 16.5* 19.6* 17.0*  NEUTROABS 14.1*  --  9.1* 6.7 8.9* 12.9*  --   --   HGB 8.9*   < > 8.9* 8.3* 9.6* 9.3* 9.1* 8.7*  HCT 27.6*   < > 27.2* 25.8* 28.7* 28.3* 27.8* 26.2*  MCV 85.4  --  85.3 84.6 84.4 84.0 83.7 84.5  PLT 153  --  186 213 315 306 349 380   < > = values in this interval not displayed.    INR: Recent Labs  Lab 05/20/17 0501 05/21/17 0453 05/22/17 0403 05/23/17 0521 05/24/17 0416  INR 2.12 1.96 2.04 2.01 2.08    Other results:  EKG:   Imaging: Ct Chest Without Contrast  Result Date: 05/23/2017 CLINICAL DATA:  Chest pain and shortness-of-breath with pleuritic pain. Evaluate for effusion. Placement of LVAD 05/15/2017. Thoracic aorta is unremarkable. EXAM: CT CHEST WITHOUT CONTRAST TECHNIQUE: Multidetector CT imaging of the chest was performed following the standard protocol without IV contrast. COMPARISON:  05/04/2017 and 04/23/2017 FINDINGS: Cardiovascular: Interval placement of LVAD. Mild cardiomegaly and new moderate pericardial fluid collection with Hounsfield unit measurements 20. Minimal fluid and air just anterior to the LVAD available postsurgical change. Mediastinum/Nodes: Few shotty mediastinal lymph nodes. No significant hilar adenopathy. Minimal radiopaque material over the mid esophagus likely oral contrast. Remaining mediastinal structures are unremarkable. Lungs/Pleura: Lungs are adequately inflated demonstrate resolution of the previously seen patchy ground-glass nodularity. Persistent linear density over the lung bases likely atelectasis. Resolution of previously seen small right pleural effusion. New small left pleural effusion. Linear atelectasis versus loculated component of pleural fluid over the lateral left upper lobe. Airways are unremarkable. Upper Abdomen: No acute findings. Musculoskeletal: Minimal degenerate change of the spine. IMPRESSION: Interval placement of LVAD. Minimal air and fluid  anterior to the LVAD compatible with postsurgical change. This could be an infected versus noninfected postoperative fluid collection. New moderate size pleural effusion. Resolution of previously seen nodular ground-glass process over the lower lobes. Resolution of right pleural effusion. New small left pleural effusion with suggestion of small loculated component over the lateral left upper lobe. Bibasilar linear atelectasis. Electronically Signed   By: Elberta Fortis M.D.   On: 05/23/2017 15:05     Medications:     Scheduled Medications: . allopurinol  100 mg Oral Daily  . amiodarone  200 mg Oral BID  . aspirin EC  81 mg Oral Daily  . bisacodyl  10 mg Oral Daily   Or  . bisacodyl  10 mg Rectal Daily  . Chlorhexidine Gluconate Cloth  6 each Topical Daily  . clonazePAM  1 mg Oral QHS  . docusate sodium  200 mg Oral Daily  . feeding supplement (PRO-STAT SUGAR FREE 64)  30 mL Oral BID  . furosemide  40 mg Oral BID  .  guaiFENesin  600 mg Oral BID  . insulin aspart  0-15 Units Subcutaneous TID WC  . insulin aspart  0-5 Units Subcutaneous QHS  . pantoprazole  40 mg Oral Daily  . potassium chloride  40 mEq Oral Daily  . sodium chloride flush  10-40 mL Intracatheter Q12H  . warfarin  4 mg Oral ONCE-1800  . Warfarin - Pharmacist Dosing Inpatient   Does not apply q1800    Infusions: . sodium chloride    . lactated ringers Stopped (05/22/17 0926)  . piperacillin-tazobactam (ZOSYN)  IV Stopped (05/24/17 0920)  . vancomycin Stopped (05/24/17 0640)    PRN Medications: acetaminophen, hydrALAZINE, levalbuterol, morphine injection, ondansetron (ZOFRAN) IV, oxyCODONE, sodium chloride flush, traMADol   Assessment:  47 year old with nonischemic cardiomyopathy EF 10% admitted with cardiogenic shock and acute on chronic systolic heart failure  HeartMate 3 implanted April 30, preop balloon pump removed  Large LV thrombus removed intact intraoperatively Postoperative bleeding from gumline at  prior dental extraction site controlled with topical hemostatic agent and product administration  Patient with moderate RV dysfunction which required low-dose inotropic support - now off milrinone .   Plan/Discussion:   Patient will need to remain in hospital until fever and white count resolve Patient has extenuating circumstances regarding his living destination after discharge from hospital and that will need to be solidified Recommend at least 5 days of IV antibiotics and continue mouth rinse with Peridex.   I reviewed the LVAD parameters from today, and compared the results to the patient's prior recorded data.  No programming changes were made.  The LVAD is functioning within specified parameters.  The patient performs LVAD self-test daily.  LVAD interrogation was negative for any significant power changes, alarms or PI events/speed drops.  LVAD equipment check completed and is in good working order.  Back-up equipment present.   LVAD education done on emergency procedures and precautions and reviewed exit site care.  Length of Stay: [address]  Kathlee Nations [place] III 05/24/2017, 12:53 PM

## 2017-05-25 ENCOUNTER — Inpatient Hospital Stay (HOSPITAL_COMMUNITY): Payer: Medicaid Other

## 2017-05-25 DIAGNOSIS — I5043 Acute on chronic combined systolic (congestive) and diastolic (congestive) heart failure: Secondary | ICD-10-CM

## 2017-05-25 LAB — COMPREHENSIVE METABOLIC PANEL
ALT: 12 U/L — ABNORMAL LOW (ref 17–63)
AST: 23 U/L (ref 15–41)
Albumin: 2 g/dL — ABNORMAL LOW (ref 3.5–5.0)
Alkaline Phosphatase: 152 U/L — ABNORMAL HIGH (ref 38–126)
Anion gap: 11 (ref 5–15)
BUN: 25 mg/dL — ABNORMAL HIGH (ref 6–20)
CO2: 26 mmol/L (ref 22–32)
Calcium: 8.5 mg/dL — ABNORMAL LOW (ref 8.9–10.3)
Chloride: 95 mmol/L — ABNORMAL LOW (ref 101–111)
Creatinine, Ser: 1.33 mg/dL — ABNORMAL HIGH (ref 0.61–1.24)
GFR calc Af Amer: >60 mL/min (ref >60)
GFR calc non Af Amer: >60 mL/min (ref >60)
Glucose, Bld: 128 mg/dL — ABNORMAL HIGH (ref 65–99)
Potassium: 3.7 mmol/L (ref 3.5–5.1)
Sodium: 132 mmol/L — ABNORMAL LOW (ref 135–145)
Total Bilirubin: 1.7 mg/dL — ABNORMAL HIGH (ref 0.3–1.2)
Total Protein: 7.4 g/dL (ref 6.5–8.1)

## 2017-05-25 LAB — CBC
HCT: 24 % — ABNORMAL LOW (ref 39.0–52.0)
Hemoglobin: 7.8 g/dL — ABNORMAL LOW (ref 13.0–17.0)
MCH: 27.3 pg (ref 26.0–34.0)
MCHC: 32.5 g/dL (ref 30.0–36.0)
MCV: 83.9 fL (ref 78.0–100.0)
Platelets: 400 10*3/uL (ref 150–400)
RBC: 2.86 MIL/uL — ABNORMAL LOW (ref 4.22–5.81)
RDW: 15.7 % — ABNORMAL HIGH (ref 11.5–15.5)
WBC: 14.9 10*3/uL — ABNORMAL HIGH (ref 4.0–10.5)

## 2017-05-25 LAB — URINE CULTURE
Culture: NO GROWTH
Special Requests: NORMAL

## 2017-05-25 LAB — ECHOCARDIOGRAM LIMITED
Height: 72 in
Weight: 3188.73 oz

## 2017-05-25 LAB — COOXEMETRY PANEL
Carboxyhemoglobin: 1.6 % — ABNORMAL HIGH (ref 0.5–1.5)
Methemoglobin: 0.6 % (ref 0.0–1.5)
O2 Saturation: 49.4 %
Total hemoglobin: 8.8 g/dL — ABNORMAL LOW (ref 12.0–16.0)

## 2017-05-25 LAB — GLUCOSE, CAPILLARY
Glucose-Capillary: 125 mg/dL — ABNORMAL HIGH (ref 65–99)
Glucose-Capillary: 111 mg/dL — ABNORMAL HIGH (ref 65–99)
Glucose-Capillary: 119 mg/dL — ABNORMAL HIGH (ref 65–99)
Glucose-Capillary: 138 mg/dL — ABNORMAL HIGH (ref 65–99)

## 2017-05-25 LAB — LACTATE DEHYDROGENASE: LDH: 270 U/L — ABNORMAL HIGH (ref 98–192)

## 2017-05-25 LAB — VANCOMYCIN, TROUGH: Vancomycin Tr: 17 ug/mL (ref 15–20)

## 2017-05-25 LAB — PROTIME-INR
INR: 2.56
Prothrombin Time: 27.3 seconds — ABNORMAL HIGH (ref 11.4–15.2)

## 2017-05-25 LAB — PREPARE RBC (CROSSMATCH)

## 2017-05-25 MED ORDER — FE FUMARATE-B12-VIT C-FA-IFC PO CAPS
1.0000 | ORAL_CAPSULE | Freq: Two times a day (BID) | ORAL | Status: DC
  Administered 2017-05-25 – 2017-06-04 (×21): 1 via ORAL
  Filled 2017-05-25 (×21): qty 1

## 2017-05-25 MED ORDER — WARFARIN SODIUM 2 MG PO TABS
2.0000 mg | ORAL_TABLET | Freq: Once | ORAL | Status: AC
  Administered 2017-05-25: 2 mg via ORAL
  Filled 2017-05-25: qty 1

## 2017-05-25 MED ORDER — SODIUM CHLORIDE 0.9 % IV SOLN: Freq: Once | INTRAVENOUS | Status: DC

## 2017-05-25 MED ORDER — CHLORHEXIDINE GLUCONATE 0.12 % MT SOLN
15.0000 mL | Freq: Two times a day (BID) | OROMUCOSAL | Status: DC
  Administered 2017-05-25 – 2017-06-03 (×17): 15 mL via OROMUCOSAL
  Filled 2017-05-25 (×18): qty 15

## 2017-05-25 MED ORDER — SODIUM CHLORIDE 0.9 % IV SOLN
125.0000 mg | Freq: Once | INTRAVENOUS | Status: AC
  Administered 2017-05-25: 125 mg via INTRAVENOUS
  Filled 2017-05-25: qty 10

## 2017-05-25 NOTE — Progress Notes (Signed)
Pharmacy Antibiotic Note  Benjamin Sherman is a 47 y.o. male admitted on 04/21/2017 with s/p LVAD implant on 4/30.  Pharmacy has been consulted for vancomycin and Zosyn dosing for new fevers and elevated WBC count. All cultures NGTD, WBC trending down, no new fevers. VT therapeutic this morning at 17 mcg/ml (drawn ~45 min early, true trough ~16).  Plan: -Continue vancomycin 750mg  IV q12h -Continue Zosyn 3.375g IV EI q8h -Monitor LOT, repeat BCx, renal function  Height: 6' (182.9 cm) Weight: 199 lb 4.7 oz (90.4 kg) IBW/kg (Calculated) : 77.6  Temp (24hrs), Avg:98.8 F (37.1 C), Min:98 F (36.7 C), Max:99.3 F (37.4 C)  Recent Labs  Lab 05/18/17 0823  05/21/17 0453 05/22/17 0403 05/23/17 0521 05/24/17 0416 05/25/17 0413  WBC  --    < > 12.3* 16.5* 19.6* 17.0* 14.9*  CREATININE  --    < > 1.10 1.09 1.06 1.20 1.33*  VANCOTROUGH 26*  --   --   --   --   --  17   < > = values in this interval not displayed.    Estimated Creatinine Clearance: 74.6 mL/min (A) (by C-G formula based on SCr of 1.33 mg/dL (H)).    Allergies  Allergen Reactions  . Bee Venom     UNSPECIFIED REACTION     Antimicrobials this admission: 4/30 Cefuroxime >> 5/4 4/30 Vancomycin >> 5/4; 5/8>> 5/8 Zosyn >> 5/8 Cefazolin x1 Periop fluconazole/rifampin 5/1  Dose adjustments this admission: 5/3 VT = 26 - reduce to 500mg  IV q12h 5/10 VT = 17 - cont 750mg  IV q12h  Microbiology results: 5/9 UCx: pending 5/7 BCx: NGTD 4/29 MRSA PCR: negative  Thank you for allowing pharmacy to be a part of this patient's care.  Fredonia Highland, PharmD, BCPS PGY-2 Cardiology Pharmacy Resident Pager: [phone] 05/25/2017

## 2017-05-25 NOTE — Progress Notes (Signed)
Pt had bleeding in his mouth on Rt. Upper gum area around 1745. Small amount of bleeding and now it stopped. Night nurse made aware of it and continue to monitor mouth bleeding. HS [employer]

## 2017-05-25 NOTE — Progress Notes (Signed)
Speed  Flow  PI  Power  LVIDD  AI  Aortic openings  MR  TR  Septum  RV   5400 4.7 3.1 4.0 3.6 none 0/5  none  mild pull to left Mod-sev hypo    5500 4.9 2.6 4.1   0/5   pull to left Mod-sev   5300 4.6 3.1 3.9   0/5    Mod-sev                                          Doppler MAP: 82   Ramp ECHO performed at bedside.  At completion of ramp study:  Fixed speed: 5300 Low speed limit: 5000  [name] RN, VAD Coordinator 24/7 pager [phone]

## 2017-05-25 NOTE — Progress Notes (Signed)
Pt currently working with OT. Will f/u as time allows. Ethelda Chick CES, ACSM 2:10 PM 05/25/2017

## 2017-05-25 NOTE — Progress Notes (Signed)
Spoke to the nurse and she said she was able to flush the line and pull blood back without resistance. Benjamin Sherman

## 2017-05-25 NOTE — Progress Notes (Addendum)
LVAD Coordinator Rounding Note:  Admitted 04/21/17 with cardiogenic shock and atrial flutter.   HM III LVAD implanted on 05/15/17 by Dr. Maren Beach  under Destination Therapy criteria due to limited social support.   Pt sitting up in the chair. Pt will receive 1 unit of blood today.   Vital signs: Temp:  98.6 HR: 90 Auto BP:  88/74 (81) Doppler: 82 O2 Sat:  98% on RA Wt: 195>211>212>208>205>207>200>217(inaccurate wt)>198>199 lbs   LVAD interrogation reveals:  Speed: 5300 Flow: 4.6 Power:  3.9w PI:  3.1 Alarms: none Events: 10 PI events Hematocrit: 24 Fixed speed: 5300 Low speed limit: 5000  Drive line site care: Existing VAD dressing removed and site care performed using sterile technique. Drive line exit site cleaned with Chlora prep applicators x 2, allowed to dry, and gauze dressing with silver strip re-applied. Exit site with no tissue ingrowth, one suture intact, the velour is fully implanted at exit site. Moderate amount of bloody/brown drainage, slighty tender when cleaning over tunneled driveline portion, no foul odor, or rash noted. Anchor intact. Pt educated today on the importance of owning his controller and batteries. Pt instructed that anytime he is moving in bed or getting up from the chair to the bed or bed to the chair he must make sure he has his equipment secure.       Labs:  LDH trend: 253>317>287>263>244>265>265>278>270  INR trend: 1.24>1.68>2.19>2.29>2.12>1.96>2.04>2.01>2.56  WBC trend: 16.5>16.1>15.5>11.7>12.3>16.5>19.6>14.9  Anticoagulation Plan: -INR Goal: 2.0 - 2.5  -ASA Dose: 81 mg (started 05/18/17 with therapeutic INR)  Blood Products:   Intra op: 05/15/17 - 1 unit plts - 1 unit FFP  Post op:  - 05/15/17>1 RBC, 2 FFP, DDAVP, Factor VII -05/16/17>1 PC - 05/25/17 > 1 PC  Device: N/A  Arrythmias: increased ectopy, short run Afib 05/15/17 - Amiodarone started   Respiratory: extubated 05/16/17  Nitric Oxide: off 05/16/17  Gtts: Milrinone> stop  05/22/17 Levo>8 mcg/min-off 5/2 Epi>1 mcg/min-off 5/3 Amiodarone>30 mg/hr-off 5/2 Lasix 6 mg/hr-off 5/3  Infection: - BC drawn 05/22/17>NTD - Vanc and Zosyn started 05/22/17 for temp 101.3 - CT scan 5/8/9 to r/o infection  Adverse Events on VAD: -none  VAD Education:  1. Delivered VAD Patient Binder and reviewed contents with patient and sister [name]). 2. Home Logbook delivered, reviewed with pt how to fill out and ask him to start completing daily. 3. Angel observed dressing change yesterday, explained steps. She says she "won't be doing", but just wanted to observe.  4. Spoke with Gene (primary caregiver) this am, unable to come today, or Saturday. He will check work schedule for Monday and call me today with definite day next week. Gene informed today that education must be complete before the pt can be discharged to home.    Plan/Recommendations:  1. Pt needs to complete reading HM III Handbook and review Discharge Binder.   2. Pt needs to complete Home VAD Logbook daily.  3. RAMP echo completed today. Decreased pts speed by 100 to 5300. 4. Call VAD pager if any questions re: VAD equipment or drive line site/care.  [name] RN, VAD Coordinator 24/7 VAD pager: [phone]

## 2017-05-25 NOTE — Progress Notes (Addendum)
Patient ID: Benjamin Sherman, male   DOB: 02-07-1970, 47 y.o.   MRN: [id number]    Advanced Heart Failure Rounding Note  Subjective:    Events: -Admitted 04/21/17 with cardiogenic shock and atrial flutter.  -Central line placed. Initial co-ox 39%. -IABP placed 04/25/17-> 04/30/17 with persistent cardiogenic shock.  -DCCV atrial flutter 4/12.   -S/p multiple teeth extractions 05/11/17.  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  -Extubated on 5/1  - driveline trauma- from repositioning in bed. 5/9-  CT Chest 05/23/17 resolution of R pleural effusion. Small left pleural effusion with suggestion of small loculated component over the lateral left upper lobe. Bibasilar linear atelectasis.   Says driveline was pulled when he was repositioned in bed. Complaining of some driveline tenderness.   Denies SOB. Orthopnea or PND. Teeth pain is getting better. Hgb 7.8   LVAD Interrogation HM 3: Speed: 5400 Flow: 4.6  PI: 4 Power: 3.2   Objective:   Weight Range:  Vital Signs:   Temp:  [98 F (36.7 C)-99.3 F (37.4 C)] 98.6 F (37 C) (05/10 0725) Pulse Rate:  [59-103] 103 (05/10 0801) Resp:  [18-29] 27 (05/10 0725) BP: (84-98)/(59-81) 86/75 (05/10 0725) SpO2:  [94 %-100 %] 98 % (05/10 0725) Weight:  [199 lb 4.7 oz (90.4 kg)] 199 lb 4.7 oz (90.4 kg) (05/10 0447) Last BM Date: 05/23/17  Weight change: Filed Weights   05/23/17 0551 05/24/17 0509 05/25/17 0447  Weight: 198 lb 8 oz (90 kg) 199 lb 12.8 oz (90.6 kg) 199 lb 4.7 oz (90.4 kg)   Intake/Output:   Intake/Output Summary (Last 24 hours) at 05/25/2017 0815 Last data filed at 05/25/2017 0700 Gross per 24 hour  Intake 1642 ml  Output 1570 ml  Net 72 ml    Physical Exam   Physical Exam: GENERAL: In bed. NAD HEENT: Normal. Edentulous  NECK: Supple, JVP 5-6 .  2+ bilaterally, no bruits.  No lymphadenopathy or thyromegaly appreciated.   CARDIAC:  Mechanical heart sounds with LVAD hum present.  LUNGS:  Clear to auscultation bilaterally.  No wheezing ABDOMEN:   Soft, round, tender, positive bowel sounds x4.     LVAD exit site:  Dressing with small amount of strike though drainage.  No erythema or drainage.  Stabilization device present and accurately applied.  Driveline dressing is being changed daily per sterile technique. Extremities: no cyanosis, clubbing, rash, edema  RUE PICC Neuro: alert & oriented x 3, cranial nerves grossly intact. moves all 4 extremities w/o difficulty. Affect pleasant   Telemetry    NSR 80s personally reviewed.   EKG   No new tracings.    Labs    Basic Metabolic Panel: Recent Labs  Lab 05/19/17 0325 05/20/17 0501 05/21/17 0453 05/22/17 0403 05/23/17 0521 05/24/17 0416 05/25/17 0413  NA 135 135 135 131* 129* 131* 132*  K 3.5 3.9 4.2 4.5 4.1 3.8 3.7  CL 97* 100* 97* 94* 93* 94* 95*  CO2 30 27 27 27 26 27 26   GLUCOSE 87 107* 122* 150* 169* 133* 128*  BUN 25* 25* 22* 19 21* 26* 25*  CREATININE 1.13 1.21 1.10 1.09 1.06 1.20 1.33*  CALCIUM 8.9 8.5* 9.0 8.9 8.6* 8.8* 8.5*  MG 1.7 2.1 1.8 1.8  --  1.9  --   PHOS 3.8  --   --   --   --   --   --    Liver Function Tests: Recent Labs  Lab 05/21/17 0453 05/22/17 0403 05/23/17 0521 05/24/17 0416 05/25/17 0413  AST 27  22 21 21 23   ALT 16* 14* 13* 13* 12*  ALKPHOS 106 130* 141* 131* 152*  BILITOT 1.4* 1.4* 1.2 1.6* 1.7*  PROT 7.6 7.4 7.0 7.1 7.4  ALBUMIN 2.4* 2.3* 2.1* 2.1* 2.0*   No results for input(s): LIPASE, AMYLASE in the last 168 hours. No results for input(s): AMMONIA in the last 168 hours.  CBC: Recent Labs  Lab 05/19/17 0325 05/20/17 0501 05/21/17 0453 05/22/17 0403 05/23/17 0521 05/24/17 0416 05/25/17 0413  WBC 11.7* 9.4 12.3* 16.5* 19.6* 17.0* 14.9*  NEUTROABS 9.1* 6.7 8.9* 12.9*  --   --   --   HGB 8.9* 8.3* 9.6* 9.3* 9.1* 8.7* 7.8*  HCT 27.2* 25.8* 28.7* 28.3* 27.8* 26.2* 24.0*  MCV 85.3 84.6 84.4 84.0 83.7 84.5 83.9  PLT 186 213 315 306 349 380 400   Cardiac Enzymes: No results for input(s): CKTOTAL, CKMB, CKMBINDEX,  TROPONINI in the last 168 hours. BNP: BNP (last 3 results) Recent Labs    04/21/17 1212 05/16/17 0529 05/22/17 0403  BNP 483.0* 262.3* 221.3*   ProBNP (last 3 results) No results for input(s): PROBNP in the last 8760 hours.  Other results:  Imaging: Ct Chest Without Contrast  Result Date: 05/23/2017 CLINICAL DATA:  Chest pain and shortness-of-breath with pleuritic pain. Evaluate for effusion. Placement of LVAD 05/15/2017. Thoracic aorta is unremarkable. EXAM: CT CHEST WITHOUT CONTRAST TECHNIQUE: Multidetector CT imaging of the chest was performed following the standard protocol without IV contrast. COMPARISON:  05/04/2017 and 04/23/2017 FINDINGS: Cardiovascular: Interval placement of LVAD. Mild cardiomegaly and new moderate pericardial fluid collection with Hounsfield unit measurements 20. Minimal fluid and air just anterior to the LVAD available postsurgical change. Mediastinum/Nodes: Few shotty mediastinal lymph nodes. No significant hilar adenopathy. Minimal radiopaque material over the mid esophagus likely oral contrast. Remaining mediastinal structures are unremarkable. Lungs/Pleura: Lungs are adequately inflated demonstrate resolution of the previously seen patchy ground-glass nodularity. Persistent linear density over the lung bases likely atelectasis. Resolution of previously seen small right pleural effusion. New small left pleural effusion. Linear atelectasis versus loculated component of pleural fluid over the lateral left upper lobe. Airways are unremarkable. Upper Abdomen: No acute findings. Musculoskeletal: Minimal degenerate change of the spine. IMPRESSION: Interval placement of LVAD. Minimal air and fluid anterior to the LVAD compatible with postsurgical change. This could be an infected versus noninfected postoperative fluid collection. New moderate size pleural effusion. Resolution of previously seen nodular ground-glass process over the lower lobes. Resolution of right pleural  effusion. New small left pleural effusion with suggestion of small loculated component over the lateral left upper lobe. Bibasilar linear atelectasis. Electronically Signed   By: Elberta Fortis M.D.   On: 05/23/2017 15:05    Medications:    Scheduled Medications: . allopurinol  100 mg Oral Daily  . amiodarone  200 mg Oral BID  . aspirin EC  81 mg Oral Daily  . bisacodyl  10 mg Oral Daily   Or  . bisacodyl  10 mg Rectal Daily  . Chlorhexidine Gluconate Cloth  6 each Topical Daily  . clonazePAM  1 mg Oral QHS  . docusate sodium  200 mg Oral Daily  . feeding supplement (PRO-STAT SUGAR FREE 64)  30 mL Oral BID  . ferrous fumarate-b12-vitamic C-folic acid  1 capsule Oral BID  . furosemide  40 mg Oral BID  . guaiFENesin  600 mg Oral BID  . insulin aspart  0-15 Units Subcutaneous TID WC  . insulin aspart  0-5 Units Subcutaneous QHS  .  pantoprazole  40 mg Oral Daily  . potassium chloride  40 mEq Oral Daily  . sodium chloride flush  10-40 mL Intracatheter Q12H  . Warfarin - Pharmacist Dosing Inpatient   Does not apply q1800    Infusions: . sodium chloride    . ferric gluconate (FERRLECIT/NULECIT) IV    . lactated ringers Stopped (05/22/17 0926)  . piperacillin-tazobactam (ZOSYN)  IV 3.375 g (05/25/17 0531)  . vancomycin Stopped (05/25/17 1610)    PRN Medications:    Assessment:   Shemika Robbs Ritthaler is a 47 y.o. 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 eosinophilic cardiomyopathy, LV thrombus, hypothyroidism, DM2, CKD Stage II-III and traumatic [facility] 11/18 after fall.   Admitted with cardiogenic shock.  Plan/Discussion:    1. S/P HMIII 4/30 under DT - LDH 270 - Continue ASA 81 daily.  - Warfarin for goal INR 2-2.5 =>INR 2.56  - Driveline was pulled while being repositioned in bed on 05/24/2017. Per VAD coordinator, velour is not exposed. Increased exudate noted. On zosyn.  -Ramp ECHO today.   2. Acute on chronic systolic HF -> cardiogenic shock:   Nonischemic cardiomyopathy. ECHO 11/2016 EF 25-30% CMRI EF 22%. Bedside echo in HF clinic EF 20% in 3/19.  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 Did not respond to steroids. Admitted 4/6 with cardiogenic shock and volume overload co-ox 38%. Echo 04/23/17: LVEF 20-25% with large LV clot, RV mild to moderately down.   Today CO-OX is 49%. Hgb down. Will give 1UPRBCs now. Repeat CO-OX in am.  -Volume status stable.  - Continue lasix 40 mg po BID.  3. Atrial flutter: New onset. S/p DCCV to NSR on 4/12.  - Maintaining NSR  -Continue po amiodarone.  4. AKI CKD Stage II-III: Likely ATN due to shock.   - creatinine 1.33 5. H/o LV Thrombus - now recurrent on echo 04/23/17 - No change to current plan.   6. DMII - Per TCTS.  - No change to current plan.   7. Hypothyroidism:  - TFTs OK 04/22/17. TSH elevated at 10.848 4/23. T4 and T3 normal.  - No change to current plan.   8. RLE erythema/cellulitis:  - Started doxy 4/20. Resolved.   9. ID - Afebrile over night.  - Covering with vanc/zosyn. May be able to narrow to cover lungs. Will discuss with MD.  - CT Chest 05/23/17 resolution of R pleural effusion. Small left pleural effusion with suggestion of small loculated component over the lateral left upper lobe. Bibasilar linear atelectasis.  - BCx - NGTD.     10. Anemia- Hgb trending down over the last 4 days from 9.6>7.8 .  No obvious source . Give 1UPRBC now. Limit transfusions.   Continue VAD education.   Tonye Becket, NP  05/25/2017 8:15 AM   Advanced Heart Failure Team Pager [phone] (M-F; 7a - 4p)  Please contact [facility] for night-coverage after hours (4p -7a ) and weekends on [email or web address]  Patient seen and examined with Tonye Becket, NP. We discussed all aspects of the encounter. I agree with the assessment and plan as stated above.   Continues to progress but co-ox remains marginal. Will not restart milrinone at this point. Will transfuse 1u RBCs. Volume status looks good.  Fevers resolved. Continues on vanc/zosyn. Will continue IV abx over the weekend,.  VAD interrogated personally. Parameters stable.  Still need to educate family on VAD and dressing changes. RAMP echo done today and speed turned down to 5300.  INR 2.56. Marland KitchenDiscussed dosing with PharmD personally.  [name], MD  6:26 PM

## 2017-05-25 NOTE — Progress Notes (Signed)
H&H was down today, Pt denied mouth bleeding or black stool, explained pt that check stool color and let nurse know. Pt had trouble to connect [facility] power to battery or vice versa, and at times pt forget to take controller with him when he move bed to chair, reminded pt about it. OT found him tried to go to bathroom without help, and couldn't change AC power line to batter power with beeping sounds. Reinforced him to call for help since pt couldn't do IV pole with walker to bathroom. Pt kept forgetting sternal precaution. Reminded pt not to pull or push with arms. HS [employer]

## 2017-05-25 NOTE — Progress Notes (Signed)
*   Echocardiogram 2D Echocardiogram has been performed.  Pieter Partridge 05/25/2017, 9:19 AM

## 2017-05-25 NOTE — Progress Notes (Signed)
ANTICOAGULATION CONSULT NOTE - Follow-Up Consult  Pharmacy Consult for Warfarin Indication: LVAD  Allergies  Allergen Reactions  . Bee Venom     UNSPECIFIED REACTION     Patient Measurements: Height: 6' (182.9 cm) Weight: 199 lb 4.7 oz (90.4 kg) IBW/kg (Calculated) : 77.6 Heparin Dosing Weight: n/a  Vital Signs: Temp: 98.9 F (37.2 C) (05/Benjamin 0447) Temp Source: Oral (05/Benjamin 0447) BP: 89/67 (05/Benjamin 0447) Pulse Rate: 73 (05/Benjamin 0447)  Labs: Recent Labs    05/23/17 0521 05/24/17 0416 05/Benjamin/19 0413  HGB 9.1* 8.7* 7.8*  HCT 27.8* 26.2* 24.0*  PLT 349 380 400  LABPROT 22.6* 23.2* 27.3*  INR 2.01 2.08 2.56  CREATININE 1.06 1.20 1.33*    Estimated Creatinine Clearance: 74.6 mL/min (A) (by C-G formula based on SCr of 1.33 mg/dL (H)).   Medical History: Past Medical History:  Diagnosis Date  . CHF (congestive heart failure) (HCC)   . Diabetes mellitus without complication (HCC)     Medications:  Scheduled:  . allopurinol  100 mg Oral Daily  . amiodarone  200 mg Oral BID  . aspirin EC  81 mg Oral Daily  . bisacodyl  Benjamin mg Oral Daily   Or  . bisacodyl  Benjamin mg Rectal Daily  . Chlorhexidine Gluconate Cloth  6 each Topical Daily  . clonazePAM  1 mg Oral QHS  . docusate sodium  200 mg Oral Daily  . feeding supplement (PRO-STAT SUGAR FREE 64)  30 mL Oral BID  . furosemide  40 mg Oral BID  . guaiFENesin  600 mg Oral BID  . insulin aspart  0-15 Units Subcutaneous TID WC  . insulin aspart  0-5 Units Subcutaneous QHS  . pantoprazole  40 mg Oral Daily  . potassium chloride  40 mEq Oral Daily  . sodium chloride flush  Benjamin-40 mL Intracatheter Q12H  . Warfarin - Pharmacist Dosing Inpatient   Does not apply q1800    Assessment: 47 yo Sherman s/p LVAD placement 4/30 on warfarin per pharmacy. INR slightly supratherapeutic today at 2.56, LDH stable, Hgb down to 7.8. Will give reduced dose tonight in setting of rapid INR rise and low Hgb.  Goal of Therapy:  INR goal 2-2.5 Monitor  platelets by anticoagulation protocol: Yes   Plan:  -Warfarin 2mg  PO x1 tonight -Daily INR  Fredonia Highland, PharmD, BCPS PGY-2 Cardiology Pharmacy Resident Pager: [phone] 5/Benjamin/2019

## 2017-05-25 NOTE — Progress Notes (Signed)
OT Cancellation Note  Patient Details Name: Benjamin Sherman MRN: [id number] DOB: [birth year]   Cancelled Treatment:    Reason Eval/Treat Not Completed: Other (comment). Pt currently eating lunch will try back later as schedule allows.  Reina Fuse [id number] 05/25/2017, 1:45 PM

## 2017-05-25 NOTE — Progress Notes (Signed)
HeartMate 3 Rounding Note POD # 8  Subjective:    47 year old diabetic with nonischemic cardiomyopathy admitted in cardiogenic shock with acute on chronic heart failure, ejection fraction 10%, moderate RV dysfunction, atrial flutter treated with cardioversion to sinus rhythm and mobile thrombus of left ventricular apex.  Patient required inotropic support and balloon pump support prior to completing evaluation for LVAD implantation.  Patient had satisfactory RV function at implantation and required low-dose inotropes.  Pump flow greater than 4 L/min with CVP 10-14.  Sternal closure successful. Preoperative balloon pump removed 6 hours postop.  Patient had significant bleeding from gums at site of previous dental extractions requiring factor replacement, blood transfusion of packed cells, and topical thrombostatic agent on gum line ;[ MRDH].  Now stopped bleeding.  Receiving Peridex rinses.  Patient now in stepdown, off milrinone. Postop fever with moderate leukocytosis now back on vancomycin and Zosyn.  Chest CT scan shows sternotomy and LVAD  site healing.  No significant pleural effusion or pneumonia.  Blood cultures remain negative.  Patient does have open wound in the right maxilla from dental extraction which could be source of fever.  White count and temperature curve improving on IV antibiotics.  Some serosanguineous drainage around the driveline exit site but patient appears to be responding to antibiotics  Patient transfused for hemoglobin 7.7 related to preoperative blood loss from oral bleeding after dental extraction and from VAD implant.  Doubt active bleeding  LVAD INTERROGATION:  HeartMate 3LVAD:  Flow 4.5  liters/min, speed 5300, power 4.0, PI 2.0.  Controller intact  Objective:    Vital Signs:   Temp:  [98 F (36.7 C)-99 F (37.2 C)] 98.7 F (37.1 C) (05/10 1523) Pulse Rate:  [59-103] 94 (05/10 1523) Resp:  [18-29] 23 (05/10 1523) BP: (85-96)/(67-82) 93/80 (05/10  1523) SpO2:  [96 %-98 %] 96 % (05/10 1523) Weight:  [199 lb 4.7 oz (90.4 kg)] 199 lb 4.7 oz (90.4 kg) (05/10 0447) Last BM Date: 05/23/17 Mean arterial Pressure 75-80  Intake/Output:   Intake/Output Summary (Last 24 hours) at 05/25/2017 1642 Last data filed at 05/25/2017 1500 Gross per 24 hour  Intake 1670 ml  Output 1450 ml  Net 220 ml     Physical Exam: General:  Well appearing.  Breathing comfortably spontaneously HEENT: normal currently no bleeding from mouth Neck: supple. JVP normal. Carotids  no bruits. No lymphadenopathy or thryomegaly appreciated. Cor: Mechanical heart sounds with LVAD hum present. Lungs: clear Abdomen: soft, nontender, nondistended. No hepatosplenomegaly. No bruits or masses. Good bowel sounds. Extremities: Warm, no cyanosis, clubbing, rash, edema Neuro: alert & orientedx3, cranial nerves grossly intact. moves all 4 extremities w/o difficulty. Affect pleasant  Telemetry: Sinus rhythm 94  Labs: Basic Metabolic Panel: Recent Labs  Lab 05/19/17 0325 05/20/17 0501 05/21/17 0453 05/22/17 0403 05/23/17 0521 05/24/17 0416 05/25/17 0413  NA 135 135 135 131* 129* 131* 132*  K 3.5 3.9 4.2 4.5 4.1 3.8 3.7  CL 97* 100* 97* 94* 93* 94* 95*  CO2 30 27 27 27 26 27 26   GLUCOSE 87 107* 122* 150* 169* 133* 128*  BUN 25* 25* 22* 19 21* 26* 25*  CREATININE 1.13 1.21 1.10 1.09 1.06 1.20 1.33*  CALCIUM 8.9 8.5* 9.0 8.9 8.6* 8.8* 8.5*  MG 1.7 2.1 1.8 1.8  --  1.9  --   PHOS 3.8  --   --   --   --   --   --     Liver Function Tests: Recent Labs  Lab 05/21/17 0453 05/22/17 0403 05/23/17 0521 05/24/17 0416 05/25/17 0413  AST 27 22 21 21 23   ALT 16* 14* 13* 13* 12*  ALKPHOS 106 130* 141* 131* 152*  BILITOT 1.4* 1.4* 1.2 1.6* 1.7*  PROT 7.6 7.4 7.0 7.1 7.4  ALBUMIN 2.4* 2.3* 2.1* 2.1* 2.0*   No results for input(s): LIPASE, AMYLASE in the last 168 hours. No results for input(s): AMMONIA in the last 168 hours.  CBC: Recent Labs  Lab 05/19/17 0325  05/20/17 0501 05/21/17 0453 05/22/17 0403 05/23/17 0521 05/24/17 0416 05/25/17 0413  WBC 11.7* 9.4 12.3* 16.5* 19.6* 17.0* 14.9*  NEUTROABS 9.1* 6.7 8.9* 12.9*  --   --   --   HGB 8.9* 8.3* 9.6* 9.3* 9.1* 8.7* 7.8*  HCT 27.2* 25.8* 28.7* 28.3* 27.8* 26.2* 24.0*  MCV 85.3 84.6 84.4 84.0 83.7 84.5 83.9  PLT 186 213 315 306 349 380 400    INR: Recent Labs  Lab 05/21/17 0453 05/22/17 0403 05/23/17 0521 05/24/17 0416 05/25/17 0413  INR 1.96 2.04 2.01 2.08 2.56    Other results:  EKG:   Imaging: No results found.   Medications:     Scheduled Medications: . allopurinol  100 mg Oral Daily  . amiodarone  200 mg Oral BID  . aspirin EC  81 mg Oral Daily  . bisacodyl  10 mg Oral Daily   Or  . bisacodyl  10 mg Rectal Daily  . Chlorhexidine Gluconate Cloth  6 each Topical Daily  . clonazePAM  1 mg Oral QHS  . docusate sodium  200 mg Oral Daily  . feeding supplement (PRO-STAT SUGAR FREE 64)  30 mL Oral BID  . ferrous fumarate-b12-vitamic C-folic acid  1 capsule Oral BID  . furosemide  40 mg Oral BID  . guaiFENesin  600 mg Oral BID  . insulin aspart  0-15 Units Subcutaneous TID WC  . insulin aspart  0-5 Units Subcutaneous QHS  . pantoprazole  40 mg Oral Daily  . potassium chloride  40 mEq Oral Daily  . sodium chloride flush  10-40 mL Intracatheter Q12H  . warfarin  2 mg Oral ONCE-1800  . Warfarin - Pharmacist Dosing Inpatient   Does not apply q1800    Infusions: . sodium chloride    . sodium chloride    . lactated ringers Stopped (05/22/17 0926)  . piperacillin-tazobactam (ZOSYN)  IV 3.375 g (05/25/17 1334)  . vancomycin Stopped (05/25/17 1610)    PRN Medications: acetaminophen, hydrALAZINE, levalbuterol, ondansetron (ZOFRAN) IV, oxyCODONE, sodium chloride flush, traMADol   Assessment:  47 year old with nonischemic cardiomyopathy EF 10% admitted with cardiogenic shock and acute on chronic systolic heart failure  HeartMate 3 implanted April 30, preop balloon  pump removed  Large LV thrombus removed intact intraoperatively Postoperative bleeding from gumline at prior dental extraction site controlled with topical hemostatic agent and product administration  Patient with moderate RV dysfunction which required low-dose inotropic support - now off milrinone .   Plan/Discussion:   Patient will need to remain in hospital until fever and white count resolve.  Driveline exit site wound should be without drainage prior to discharge because of patient's social circumstances after leaving hospital   Recommend at least 5 days of IV antibiotics and continue mouth rinse with Peridex.   I reviewed the LVAD parameters from today, and compared the results to the patient's prior recorded data.  No programming changes were made.  The LVAD is functioning within specified parameters.  The patient performs LVAD self-test  daily.  LVAD interrogation was negative for any significant power changes, alarms or PI events/speed drops.  LVAD equipment check completed and is in good working order.  Back-up equipment present.   LVAD education done on emergency procedures and precautions and reviewed exit site care.  Length of Stay: [address]  Kathlee Nations [place] III 05/25/2017, 4:42 PM

## 2017-05-25 NOTE — Progress Notes (Signed)
Occupational Therapy Treatment Patient Details Name: Benjamin Sherman MRN: [id number] DOB: 04-Sep-1970 Today's Date: 05/25/2017    History of present illness 47 yo admitted with cardiogenic shock, s/p RHC and IABP insertion with continued shock 4/29 , teeth extraction 4/26, HM111 4/30, extubated 5/1. PMHx: HF, cardiomyopathy, LV thrombus, hypothyroidism, DM, CKD, traumatic [facility] 11/2016   OT comments  This 47 yo male seen today for OT treatment. Found pt trying to change himself from wall power to battery power due to he needed to go to bathroom. He had not called for A and was not safe with what he was trying to do (see cognitive section). Explained to him that it is good that he is trying to do things for himself but for right now anytime he wants to get up and/or change power sources he needs to call staff for A to S him to make sure all is going like it should--he verbalized understanding.  Follow Up Recommendations  No OT follow up;Supervision/Assistance - 24 hour    Equipment Recommendations  3 in 1 bedside commode       Precautions / Restrictions Precautions Precautions: Sternal Precaution Comments: LVAD       Mobility Bed Mobility               General bed mobility comments: in chair on arrival   Transfers Overall transfer level: Needs assistance Equipment used: None Transfers: Sit to/from Stand Sit to Stand: Min assist         General transfer comment: cues for hand placement not to pull to lean forward; did better when cued him to rock and count to 3 to stand        ADL either performed or assessed with clinical judgement   ADL Overall ADL's : Needs assistance/impaired     Grooming: Min guard;Standing Grooming Details (indicate cue type and reason): wash hands                 Toilet Transfer: Minimal assistance;Ambulation;Comfort height toilet Toilet Transfer Details (indicate cue type and reason): No AD; VCs to not use his arms for sit<>stand    Toileting - Clothing Manipulation Details (indicate cue type and reason): min A sit<>stand with rocking motion and counting to 3, min guard A for clothing and hygiene management             Vision Patient Visual Report: No change from baseline            Cognition                                       General Comments: Upon entering room pt had battry and battery clip in his hand, when asking if he was practicing he said no I'm going to the bathroom. Pt unhooked one line from wall unit and just layed battery in his lap while he then got his other battery and clip and was trying to put them together while the whole time the controller was alarming due to he had already disconnected one cord and had just left it laying in his lap. Had to cue him to reconnect the one he had already disconnected. Then when he went to do the other one he disconnected fine, but tried to reconnect to wall unit not battery. He did make sure the drive controller was around his neck. Had to be cued to put holster vest on  before putting batteries in it.                   Pertinent Vitals/ Pain        A brief period of pain when pt rolled from his left side to his back in bed, but then                                                                              Dissipated per pt.         Frequency  Min 2X/week        Progress Toward Goals  OT Goals(current goals can now be found in the care plan section)  Progress towards OT goals: Progressing toward goals     Plan Discharge plan remains appropriate       AM-PAC PT "6 Clicks" Daily Activity     Outcome Measure   Help from another person eating meals?: None Help from another person taking care of personal grooming?: A Little Help from another person toileting, which includes using toliet, bedpan, or urinal?: A Little Help from another person bathing (including washing, rinsing, drying)?: A Little Help from another person to  put on and taking off regular upper body clothing?: A Lot Help from another person to put on and taking off regular lower body clothing?: A Lot 6 Click Score: 17    End of Session Equipment Utilized During Treatment: Gait belt  OT Visit Diagnosis: Unsteadiness on feet (R26.81);Other symptoms and signs involving cognitive function   Activity Tolerance Patient tolerated treatment well   Patient Left in bed;with call bell/phone within reach;with bed alarm set   Nurse Communication (finding pt trying to disconnect from wall and go on batteries due to he had to go to bathroom but did not call for A)        Time: 2094-7096 OT Time Calculation (min): 32 min  Charges: OT General Charges $OT Visit: 1 Visit OT Treatments $Self Care/Home Management : 23-37 mins  Ignacia Palma, OTR/L [phone] 05/25/2017

## 2017-05-25 NOTE — Progress Notes (Signed)
Physical Therapy Treatment Patient Details Name: Benjamin Sherman MRN: [id number] DOB: 01-Mar-1970 Today's Date: 05/25/2017    History of Present Illness 47 yo admitted with cardiogenic shock, s/p RHC and IABP insertion with continued shock 4/29 , teeth extraction 4/26, HM111 4/30, extubated 5/1. PMHx: HF, cardiomyopathy, LV thrombus, hypothyroidism, DM, CKD, traumatic [facility] 11/2016    PT Comments    Pt very pleasant, in chair on arrival. Pt able to transition one line to battery without assist and on second line trying to connect battery to power line and needed cues for sequence, min assist to place batteries in holster. Pt with continued ability with ambulation and able to walk short distance without RW with decreased balance noted. Pt educated for HEp and encouraged continued mobility with nursing. Pt remaining on battery end of session.   5450 speed 2.8 PI 4.9 flow BP 86/75 (80) HR 97-103    Follow Up Recommendations  Home health PT;Supervision/Assistance - 24 hour     Equipment Recommendations  Rolling walker with 5" wheels;3in1 (PT)    Recommendations for Other Services       Precautions / Restrictions Precautions Precautions: Sternal Precaution Comments: LVAD    Mobility  Bed Mobility               General bed mobility comments: in chair on arrival   Transfers Overall transfer level: Needs assistance   Transfers: Sit to/from Stand Sit to Stand: Supervision         General transfer comment: cues for hand placement not to pull to lean forward  Ambulation/Gait Ambulation/Gait assistance: Min guard Ambulation Distance (Feet): 600 Feet Assistive device: Rolling walker (2 wheeled) Gait Pattern/deviations: Step-through pattern;Decreased stride length   Gait velocity interpretation: <1.8 ft/sec, indicate of risk for recurrent falls General Gait Details: pt with good stability and posture with RW. Walked last 28' without RW with 2 partial LOB with tactile cues  to recover   Stairs             Wheelchair Mobility    Modified Rankin (Stroke Patients Only)       Balance Overall balance assessment: Needs assistance   Sitting balance-Leahy Scale: Good       Standing balance-Leahy Scale: Good                              Cognition Arousal/Alertness: Awake/alert Behavior During Therapy: WFL for tasks assessed/performed Overall Cognitive Status: Impaired/Different from baseline Area of Impairment: Memory                     Memory: Decreased recall of precautions         General Comments: pt with min cue for transitioning with one line to battery. Pt able to recall all precautions appropriately other than moving in tube      Exercises General Exercises - Lower Extremity Long Arc Quad: AROM;10 reps;Seated;Both Hip Flexion/Marching: AROM;10 reps;Seated;Both    General Comments        Pertinent Vitals/Pain Pain Assessment: No/denies pain Pain Location: pocket pain soreness with gait momentarily, no pain at rest    Home Living                      Prior Function            PT Goals (current goals can now be found in the care plan section) Progress towards PT goals: Progressing toward goals  Frequency           PT Plan Current plan remains appropriate;Frequency needs to be updated    Co-evaluation              AM-PAC PT "6 Clicks" Daily Activity  Outcome Measure  Difficulty turning over in bed (including adjusting bedclothes, sheets and blankets)?: A Little Difficulty moving from lying on back to sitting on the side of the bed? : A Little Difficulty sitting down on and standing up from a chair with arms (e.g., wheelchair, bedside commode, etc,.)?: A Little Help needed moving to and from a bed to chair (including a wheelchair)?: A Little Help needed walking in hospital room?: A Little Help needed climbing 3-5 steps with a railing? : A Little 6 Click Score: 18     End of Session   Activity Tolerance: Patient tolerated treatment well Patient left: in chair;with call bell/phone within reach Nurse Communication: Mobility status;Precautions PT Visit Diagnosis: Other abnormalities of gait and mobility (R26.89)     Time: 9169-4503 PT Time Calculation (min) (ACUTE ONLY): 22 min  Charges:  $Gait Training: 8-22 mins                    G Codes:       [name], PT [phone]    Marybell Robards B Fredricka Kohrs 05/25/2017, 8:08 AM

## 2017-05-26 ENCOUNTER — Inpatient Hospital Stay (HOSPITAL_COMMUNITY): Payer: Medicaid Other

## 2017-05-26 LAB — BPAM RBC
Blood Product Expiration Date: 201905242359
ISSUE DATE / TIME: 201905101449
Unit Type and Rh: 6200

## 2017-05-26 LAB — COMPREHENSIVE METABOLIC PANEL
ALT: 10 U/L — ABNORMAL LOW (ref 17–63)
AST: 21 U/L (ref 15–41)
Albumin: 2.1 g/dL — ABNORMAL LOW (ref 3.5–5.0)
Alkaline Phosphatase: 177 U/L — ABNORMAL HIGH (ref 38–126)
Anion gap: 10 (ref 5–15)
BUN: 20 mg/dL (ref 6–20)
CO2: 26 mmol/L (ref 22–32)
Calcium: 8.8 mg/dL — ABNORMAL LOW (ref 8.9–10.3)
Chloride: 96 mmol/L — ABNORMAL LOW (ref 101–111)
Creatinine, Ser: 1.15 mg/dL (ref 0.61–1.24)
GFR calc Af Amer: >60 mL/min (ref >60)
GFR calc non Af Amer: >60 mL/min (ref >60)
Glucose, Bld: 122 mg/dL — ABNORMAL HIGH (ref 65–99)
Potassium: 3.5 mmol/L (ref 3.5–5.1)
Sodium: 132 mmol/L — ABNORMAL LOW (ref 135–145)
Total Bilirubin: 1.8 mg/dL — ABNORMAL HIGH (ref 0.3–1.2)
Total Protein: 7.6 g/dL (ref 6.5–8.1)

## 2017-05-26 LAB — PROTIME-INR
INR: 2.53
Prothrombin Time: 27.1 seconds — ABNORMAL HIGH (ref 11.4–15.2)

## 2017-05-26 LAB — COOXEMETRY PANEL
Carboxyhemoglobin: 1.5 % (ref 0.5–1.5)
Methemoglobin: 1.4 % (ref 0.0–1.5)
O2 Saturation: 58.6 %
Total hemoglobin: 9.1 g/dL — ABNORMAL LOW (ref 12.0–16.0)

## 2017-05-26 LAB — GLUCOSE, CAPILLARY
Glucose-Capillary: 120 mg/dL — ABNORMAL HIGH (ref 65–99)
Glucose-Capillary: 139 mg/dL — ABNORMAL HIGH (ref 65–99)
Glucose-Capillary: 183 mg/dL — ABNORMAL HIGH (ref 65–99)
Glucose-Capillary: 118 mg/dL — ABNORMAL HIGH (ref 65–99)

## 2017-05-26 LAB — TYPE AND SCREEN
ABO/RH(D): A POS
Antibody Screen: NEGATIVE
Unit division: 0

## 2017-05-26 LAB — CBC
HCT: 27.2 % — ABNORMAL LOW (ref 39.0–52.0)
Hemoglobin: 9 g/dL — ABNORMAL LOW (ref 13.0–17.0)
MCH: 27.9 pg (ref 26.0–34.0)
MCHC: 33.1 g/dL (ref 30.0–36.0)
MCV: 84.2 fL (ref 78.0–100.0)
Platelets: 476 10*3/uL — ABNORMAL HIGH (ref 150–400)
RBC: 3.23 MIL/uL — ABNORMAL LOW (ref 4.22–5.81)
RDW: 15.7 % — ABNORMAL HIGH (ref 11.5–15.5)
WBC: 14.3 10*3/uL — ABNORMAL HIGH (ref 4.0–10.5)

## 2017-05-26 LAB — LACTATE DEHYDROGENASE: LDH: 293 U/L — ABNORMAL HIGH (ref 98–192)

## 2017-05-26 MED ORDER — WARFARIN SODIUM 2 MG PO TABS
2.0000 mg | ORAL_TABLET | Freq: Once | ORAL | Status: AC
  Administered 2017-05-26: 2 mg via ORAL
  Filled 2017-05-26: qty 1

## 2017-05-26 NOTE — Plan of Care (Signed)
  Problem: Activity: Goal: Ability to return to baseline activity level will improve Outcome: Progressing   Pt encouraged to remember to use sternal precautions.

## 2017-05-26 NOTE — Progress Notes (Signed)
Patient ID: Star Cheese, male   DOB: [birth year], 47 y.o.   MRN: [id number]    Advanced Heart Failure Rounding Note  Subjective:    Events: -Admitted 04/21/17 with cardiogenic shock and atrial flutter.  -Central line placed. Initial co-ox 39%. -IABP placed 04/25/17-> 04/30/17 with persistent cardiogenic shock.  -DCCV atrial flutter 4/12.   -S/p multiple teeth extractions 05/11/17.  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  -Extubated on 5/1  - driveline trauma- from repositioning in bed. 5/9-  CT Chest 05/23/17 resolution of R pleural effusion. Small left pleural effusion with suggestion of small loculated component over the lateral left upper lobe. Bibasilar linear atelectasis.   Says driveline was pulled when he was repositioned in bed.  Received 1uRBCs yesterday. Feels ok. Had a little bleeding from mouth yesterday. Hgb 7.8->9.0  Denies SOB or orthopnea. Co-ox u to 59%  Ramp echo with speed turned down to 5300. MAP 70-80s  LVAD Interrogation HM 3: Speed: 5300 Flow: 4.2  PI: 3.8 Power: 4.0  Objective:   Weight Range:  Vital Signs:   Temp:  [98.1 F (36.7 C)-99.5 F (37.5 C)] 98.6 F (37 C) (05/11 1158) Pulse Rate:  [81-94] 90 (05/11 1158) Resp:  [22-31] 27 (05/11 1158) BP: (81-106)/(55-85) 106/85 (05/11 1158) SpO2:  [96 %-100 %] 100 % (05/11 1158) Weight:  [89.2 kg (196 lb 10.4 oz)] 89.2 kg (196 lb 10.4 oz) (05/11 0523) Last BM Date: 05/25/17  Weight change: Filed Weights   05/24/17 0509 05/25/17 0447 05/26/17 0523  Weight: 90.6 kg (199 lb 12.8 oz) 90.4 kg (199 lb 4.7 oz) 89.2 kg (196 lb 10.4 oz)   Intake/Output:   Intake/Output Summary (Last 24 hours) at 05/26/2017 1200 Last data filed at 05/26/2017 1100 Gross per 24 hour  Intake 1058.5 ml  Output 1275 ml  Net -216.5 ml    Physical Exam   Physical Exam: General:  NAD. Sitting up in chair HEENT: normal  Edentulous  Neck: supple. JVP 6-7  Carotids 2+ bilat; no bruits. No lymphadenopathy or thryomegaly appreciated. Cor: sternal wound  ok. LVAD hum.  Lungs: Clear. Abdomen: osoft, nontender, non-distended. No hepatosplenomegaly. No bruits or masses. Good bowel sounds. Driveline site clean. Anchor in place.  Extremities: no cyanosis, clubbing, rash. Warm no edema  +PICC Neuro: alert & oriented x 3. No focal deficits. Moves all 4 without problem    Telemetry    NSR 80-90s personally reviewed.   EKG   No new tracings.    Labs    Basic Metabolic Panel: Recent Labs  Lab 05/20/17 0501 05/21/17 0453 05/22/17 0403 05/23/17 0521 05/24/17 0416 05/25/17 0413 05/26/17 0425  NA 135 135 131* 129* 131* 132* 132*  K 3.9 4.2 4.5 4.1 3.8 3.7 3.5  CL 100* 97* 94* 93* 94* 95* 96*  CO2 27 27 27 26 27 26 26   GLUCOSE 107* 122* 150* 169* 133* 128* 122*  BUN 25* 22* 19 21* 26* 25* 20  CREATININE 1.21 1.10 1.09 1.06 1.20 1.33* 1.15  CALCIUM 8.5* 9.0 8.9 8.6* 8.8* 8.5* 8.8*  MG 2.1 1.8 1.8  --  1.9  --   --    Liver Function Tests: Recent Labs  Lab 05/22/17 0403 05/23/17 0521 05/24/17 0416 05/25/17 0413 05/26/17 0425  AST 22 21 21 23 21   ALT 14* 13* 13* 12* 10*  ALKPHOS 130* 141* 131* 152* 177*  BILITOT 1.4* 1.2 1.6* 1.7* 1.8*  PROT 7.4 7.0 7.1 7.4 7.6  ALBUMIN 2.3* 2.1* 2.1* 2.0* 2.1*   No results  for input(s): LIPASE, AMYLASE in the last 168 hours. No results for input(s): AMMONIA in the last 168 hours.  CBC: Recent Labs  Lab 05/20/17 0501 05/21/17 0453 05/22/17 0403 05/23/17 0521 05/24/17 0416 05/25/17 0413 05/26/17 0425  WBC 9.4 12.3* 16.5* 19.6* 17.0* 14.9* 14.3*  NEUTROABS 6.7 8.9* 12.9*  --   --   --   --   HGB 8.3* 9.6* 9.3* 9.1* 8.7* 7.8* 9.0*  HCT 25.8* 28.7* 28.3* 27.8* 26.2* 24.0* 27.2*  MCV 84.6 84.4 84.0 83.7 84.5 83.9 84.2  PLT 213 315 306 349 380 400 476*   Cardiac Enzymes: No results for input(s): CKTOTAL, CKMB, CKMBINDEX, TROPONINI in the last 168 hours. BNP: BNP (last 3 results) Recent Labs    04/21/17 1212 05/16/17 0529 05/22/17 0403  BNP 483.0* 262.3* 221.3*   ProBNP (last  3 results) No results for input(s): PROBNP in the last 8760 hours.  Other results:  Imaging: Dg Chest 2 View  Result Date: 05/26/2017 CLINICAL DATA:  LEFT ventricular assist device EXAM: CHEST - 2 VIEW COMPARISON:  05/20/2017 FINDINGS: RIGHT arm PICC line with tip projecting over SVC. LEFT ventricular assist device at cardiac apex. Prior median sternotomy. Enlargement of cardiac silhouette. Mediastinal contours and pulmonary vascularity normal. Bibasilar atelectasis. No acute infiltrate, pleural effusion or pneumothorax. IMPRESSION: Enlargement of cardiac silhouette post LVAD. Mild bibasilar atelectasis. Electronically Signed   By: Ulyses Southward M.D.   On: 05/26/2017 08:54    Medications:    Scheduled Medications: . allopurinol  100 mg Oral Daily  . amiodarone  200 mg Oral BID  . aspirin EC  81 mg Oral Daily  . bisacodyl  10 mg Oral Daily   Or  . bisacodyl  10 mg Rectal Daily  . chlorhexidine  15 mL Mouth/Throat BID  . clonazePAM  1 mg Oral QHS  . docusate sodium  200 mg Oral Daily  . feeding supplement (PRO-STAT SUGAR FREE 64)  30 mL Oral BID  . ferrous fumarate-b12-vitamic C-folic acid  1 capsule Oral BID  . furosemide  40 mg Oral BID  . guaiFENesin  600 mg Oral BID  . insulin aspart  0-15 Units Subcutaneous TID WC  . insulin aspart  0-5 Units Subcutaneous QHS  . pantoprazole  40 mg Oral Daily  . potassium chloride  40 mEq Oral Daily  . sodium chloride flush  10-40 mL Intracatheter Q12H  . warfarin  2 mg Oral ONCE-1800  . Warfarin - Pharmacist Dosing Inpatient   Does not apply q1800    Infusions: . sodium chloride    . sodium chloride    . lactated ringers Stopped (05/22/17 0926)  . piperacillin-tazobactam (ZOSYN)  IV Stopped (05/26/17 0925)  . vancomycin Stopped (05/26/17 0545)    PRN Medications:    Assessment:   Jayzeon Whoolery is a 47 y.o. 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 eosinophilic cardiomyopathy, LV thrombus,  hypothyroidism, DM2, CKD Stage II-III and traumatic [facility] 11/18 after fall.   Admitted with cardiogenic shock.  Plan/Discussion:    1. S/P HMIII 4/30 under DT - continues to improve POD #10 - Volume status ok  - LDH 293 - Continue ASA 81 daily.  - Warfarin for goal INR 2-2.5 =>INR 2.53 Discussed dosing with PharmD personally. - Driveline was pulled while being repositioned in bed on 05/24/2017. Per VAD coordinator, velour is not exposed. Increased exudate noted. VAD coordinators following On zosyn.  - Speed adjusted to 5300 with ramp echo 5/10  2. Acute on chronic systolic HF -> cardiogenic shock:  Nonischemic cardiomyopathy. ECHO 11/2016 EF 25-30% CMRI EF 22%. Bedside echo in HF clinic EF 20% in 3/19.  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 Did not respond to steroids. Admitted 4/6 with cardiogenic shock and volume overload co-ox 38%. Echo 04/23/17: LVEF 20-25% with large LV clot, RV mild to moderately down.   - co-ox improved to 59% with RBCs - Volume status stable.  - Continue lasix 40 mg po BID. May need to drop back soon  3. Atrial flutter: New onset. S/p DCCV to NSR on 4/12.  - Maintaining NSR  -Continue po amiodarone.  4. AKI CKD Stage II-III: Likely ATN due to shock.   - creatinine 1.15 5. H/o LV Thrombus -   - resected with VAD placement 6. DMII - Per TCTS.  - No change to current plan.   7. Hypothyroidism:  - TFTs OK 04/22/17. TSH elevated at 10.848 4/23. T4 and T3 normal.  - No change to current plan.   8. RLE erythema/cellulitis:  - Started doxy 4/20. Resolved.   9. ID - Afebrile over night.  - Covering with vanc/zosyn. - CT Chest 05/23/17 resolution of R pleural effusion. Small left pleural effusion with suggestion of small loculated component over the lateral left upper lobe. Bibasilar linear atelectasis.  - BCx - NGTD.     10. Anemia-post-op blood loss - improved s/p 1u RBC 5/10  Continue VAD education and ambulation.   [name], MD   05/26/2017 12:00 PM   Advanced Heart Failure Team Pager [phone] (M-F; 7a - 4p)  Please contact [facility] for night-coverage after hours (4p -7a ) and weekends on [email or web address]

## 2017-05-26 NOTE — Progress Notes (Signed)
ANTICOAGULATION CONSULT NOTE - Follow-Up Consult  Pharmacy Consult for Warfarin Indication: LVAD  Allergies  Allergen Reactions  . Bee Venom     UNSPECIFIED REACTION     Patient Measurements: Height: 6' (182.9 cm) Weight: 196 lb 10.4 oz (89.2 kg) IBW/kg (Calculated) : 77.6 Heparin Dosing Weight: n/a  Vital Signs: Temp: 98.1 F (36.7 C) (05/11 0716) Temp Source: Oral (05/11 0716) BP: 101/55 (05/11 0716) Pulse Rate: 90 (05/11 0716)  Labs: Recent Labs    05/24/17 0416 05/25/17 0413 05/26/17 0425  HGB 8.7* 7.8* 9.0*  HCT 26.2* 24.0* 27.2*  PLT 380 400 476*  LABPROT 23.2* 27.3* 27.1*  INR 2.08 2.56 2.53  CREATININE 1.20 1.33* 1.15    Estimated Creatinine Clearance: 86.2 mL/min (by C-G formula based on SCr of 1.15 mg/dL).   Medical History: Past Medical History:  Diagnosis Date  . CHF (congestive heart failure) (HCC)   . Diabetes mellitus without complication (HCC)     Medications:  Scheduled:  . allopurinol  100 mg Oral Daily  . amiodarone  200 mg Oral BID  . aspirin EC  81 mg Oral Daily  . bisacodyl  10 mg Oral Daily   Or  . bisacodyl  10 mg Rectal Daily  . chlorhexidine  15 mL Mouth/Throat BID  . clonazePAM  1 mg Oral QHS  . docusate sodium  200 mg Oral Daily  . feeding supplement (PRO-STAT SUGAR FREE 64)  30 mL Oral BID  . ferrous fumarate-b12-vitamic C-folic acid  1 capsule Oral BID  . furosemide  40 mg Oral BID  . guaiFENesin  600 mg Oral BID  . insulin aspart  0-15 Units Subcutaneous TID WC  . insulin aspart  0-5 Units Subcutaneous QHS  . pantoprazole  40 mg Oral Daily  . potassium chloride  40 mEq Oral Daily  . sodium chloride flush  10-40 mL Intracatheter Q12H  . Warfarin - Pharmacist Dosing Inpatient   Does not apply q1800    Assessment: 47 yo male s/p LVAD placement 4/30 on warfarin per pharmacy. INR slightly stable at at goal today at 2.5, LDH stable, Hgb up to 9 after one unit overnight.   Goal of Therapy:  INR goal 2-2.5 Monitor  platelets by anticoagulation protocol: Yes   Plan:  -Warfarin 2mg  PO x1 again tonight -Daily INR  Sheppard Coil PharmD., BCPS Clinical Pharmacist 05/26/2017 8:34 AM

## 2017-05-27 LAB — COMPREHENSIVE METABOLIC PANEL
ALT: 12 U/L — ABNORMAL LOW (ref 17–63)
AST: 23 U/L (ref 15–41)
Albumin: 2 g/dL — ABNORMAL LOW (ref 3.5–5.0)
Alkaline Phosphatase: 189 U/L — ABNORMAL HIGH (ref 38–126)
Anion gap: 10 (ref 5–15)
BUN: 16 mg/dL (ref 6–20)
CO2: 26 mmol/L (ref 22–32)
Calcium: 8.7 mg/dL — ABNORMAL LOW (ref 8.9–10.3)
Chloride: 96 mmol/L — ABNORMAL LOW (ref 101–111)
Creatinine, Ser: 1.13 mg/dL (ref 0.61–1.24)
GFR calc Af Amer: >60 mL/min (ref >60)
GFR calc non Af Amer: >60 mL/min (ref >60)
Glucose, Bld: 135 mg/dL — ABNORMAL HIGH (ref 65–99)
Potassium: 3.6 mmol/L (ref 3.5–5.1)
Sodium: 132 mmol/L — ABNORMAL LOW (ref 135–145)
Total Bilirubin: 1.2 mg/dL (ref 0.3–1.2)
Total Protein: 7.7 g/dL (ref 6.5–8.1)

## 2017-05-27 LAB — CBC
HCT: 27 % — ABNORMAL LOW (ref 39.0–52.0)
Hemoglobin: 9 g/dL — ABNORMAL LOW (ref 13.0–17.0)
MCH: 27.9 pg (ref 26.0–34.0)
MCHC: 33.3 g/dL (ref 30.0–36.0)
MCV: 83.6 fL (ref 78.0–100.0)
Platelets: 517 10*3/uL — ABNORMAL HIGH (ref 150–400)
RBC: 3.23 MIL/uL — ABNORMAL LOW (ref 4.22–5.81)
RDW: 15.4 % (ref 11.5–15.5)
WBC: 12.8 10*3/uL — ABNORMAL HIGH (ref 4.0–10.5)

## 2017-05-27 LAB — GLUCOSE, CAPILLARY
Glucose-Capillary: 109 mg/dL — ABNORMAL HIGH (ref 65–99)
Glucose-Capillary: 103 mg/dL — ABNORMAL HIGH (ref 65–99)
Glucose-Capillary: 204 mg/dL — ABNORMAL HIGH (ref 65–99)

## 2017-05-27 LAB — LACTATE DEHYDROGENASE: LDH: 318 U/L — ABNORMAL HIGH (ref 98–192)

## 2017-05-27 LAB — COOXEMETRY PANEL
Carboxyhemoglobin: 1.3 % (ref 0.5–1.5)
Methemoglobin: 1.4 % (ref 0.0–1.5)
O2 Saturation: 51.4 %
Total hemoglobin: 9.6 g/dL — ABNORMAL LOW (ref 12.0–16.0)

## 2017-05-27 LAB — CULTURE, BLOOD (ROUTINE X 2)
Culture: NO GROWTH
Culture: NO GROWTH
Special Requests: ADEQUATE
Special Requests: ADEQUATE

## 2017-05-27 LAB — PROTIME-INR
INR: 2.33
Prothrombin Time: 25.4 seconds — ABNORMAL HIGH (ref 11.4–15.2)

## 2017-05-27 MED ORDER — WARFARIN SODIUM 1 MG PO TABS
1.0000 mg | ORAL_TABLET | Freq: Once | ORAL | Status: AC
  Administered 2017-05-27: 1 mg via ORAL
  Filled 2017-05-27: qty 1

## 2017-05-27 MED ORDER — FLUCONAZOLE 100 MG PO TABS
100.0000 mg | ORAL_TABLET | Freq: Every day | ORAL | Status: AC
  Administered 2017-05-27 – 2017-05-30 (×4): 100 mg via ORAL
  Filled 2017-05-27 (×4): qty 1

## 2017-05-27 MED ORDER — PIVOT 1.5 CAL PO LIQD
237.0000 mL | Freq: Three times a day (TID) | ORAL | Status: DC
  Filled 2017-05-27 (×12): qty 1000

## 2017-05-27 MED ORDER — WARFARIN SODIUM 2 MG PO TABS: 2.0000 mg | ORAL_TABLET | Freq: Once | ORAL | Status: DC

## 2017-05-27 NOTE — Progress Notes (Signed)
HeartMate 3 Rounding Note POD # 12  Subjective:    47 year old diabetic with nonischemic cardiomyopathy admitted in cardiogenic shock with acute on chronic heart failure, ejection fraction 10%, moderate RV dysfunction, atrial flutter treated with cardioversion to sinus rhythm and mobile thrombus of left ventricular apex.  Patient required inotropic support and balloon pump support prior to completing evaluation for LVAD implantation.  Patient had satisfactory RV function at implantation and required low-dose inotropes.  Pump flow greater than 4 L/min with CVP 10-14.  Sternal closure successful. Preoperative balloon pump removed 6 hours postop.  Patient had significant bleeding from gums at site of previous dental extractions requiring factor replacement, blood transfusion of packed cells, and topical thrombostatic agent on gum line ;[ MRDH].  Now stopped bleeding.  Receiving Peridex rinses.  Patient now in stepdown, off milrinone. Postop fever with moderate leukocytosis now back on vancomycin and Zosyn.  Chest CT scan shows sternotomy and LVAD  site healing.  No significant pleural effusion or pneumonia.  Blood cultures remain negative.  Patient does have open wound in the right maxilla from dental extraction which is probably the source of fever.  White count and temperature curve improving on IV antibiotics.  Some serosanguineous drainage around the driveline exit site but patient appears to be responding to antibiotics.  White count has reduced to 12,000 however patient had a temperature of 101 last night Will give short course of oral Diflucan and watch INR carefully    LVAD INTERROGATION:  HeartMate 3LVAD:  Flow 4.5  liters/min, speed 5300, power 4.0, PI 2.0.  Controller intact  Objective:    Vital Signs:   Temp:  [98.1 F (36.7 C)-101.2 F (38.4 C)] 99 F (37.2 C) (05/12 1120) Pulse Rate:  [88-97] 91 (05/12 1120) Resp:  [23-31] 28 (05/12 1120) BP: (90-123)/(59-94) 107/91 (05/12  1120) SpO2:  [97 %-100 %] 100 % (05/12 1120) Weight:  [196 lb 3.2 oz (89 kg)] 196 lb 3.2 oz (89 kg) (05/12 0434) Last BM Date: 05/25/17 Mean arterial Pressure 75-80  Intake/Output:   Intake/Output Summary (Last 24 hours) at 05/27/2017 1330 Last data filed at 05/27/2017 1100 Gross per 24 hour  Intake 940 ml  Output 2150 ml  Net -1210 ml     Physical Exam: General:  Well appearing.  Breathing comfortably spontaneously HEENT: normal currently no bleeding from mouth Neck: supple. JVP normal. Carotids  no bruits. No lymphadenopathy or thryomegaly appreciated. Cor: Mechanical heart sounds with LVAD hum present. Lungs: clear Abdomen: soft, nontender, nondistended. No hepatosplenomegaly. No bruits or masses. Good bowel sounds. Extremities: Warm, no cyanosis, clubbing, rash, edema Neuro: alert & orientedx3, cranial nerves grossly intact. moves all 4 extremities w/o difficulty. Affect pleasant  Telemetry: Sinus rhythm 94  Labs: Basic Metabolic Panel: Recent Labs  Lab 05/21/17 0453 05/22/17 0403 05/23/17 0521 05/24/17 0416 05/25/17 0413 05/26/17 0425 05/27/17 0436  NA 135 131* 129* 131* 132* 132* 132*  K 4.2 4.5 4.1 3.8 3.7 3.5 3.6  CL 97* 94* 93* 94* 95* 96* 96*  CO2 27 27 26 27 26 26 26   GLUCOSE 122* 150* 169* 133* 128* 122* 135*  BUN 22* 19 21* 26* 25* 20 16  CREATININE 1.10 1.09 1.06 1.20 1.33* 1.15 1.13  CALCIUM 9.0 8.9 8.6* 8.8* 8.5* 8.8* 8.7*  MG 1.8 1.8  --  1.9  --   --   --     Liver Function Tests: Recent Labs  Lab 05/23/17 5672 05/24/17 0416 05/25/17 0413 05/26/17 0425 05/27/17 0436  AST 21 21 23 21 23   ALT 13* 13* 12* 10* 12*  ALKPHOS 141* 131* 152* 177* 189*  BILITOT 1.2 1.6* 1.7* 1.8* 1.2  PROT 7.0 7.1 7.4 7.6 7.7  ALBUMIN 2.1* 2.1* 2.0* 2.1* 2.0*   No results for input(s): LIPASE, AMYLASE in the last 168 hours. No results for input(s): AMMONIA in the last 168 hours.  CBC: Recent Labs  Lab 05/21/17 0453 05/22/17 0403 05/23/17 0521  05/24/17 0416 05/25/17 0413 05/26/17 0425 05/27/17 0436  WBC 12.3* 16.5* 19.6* 17.0* 14.9* 14.3* 12.8*  NEUTROABS 8.9* 12.9*  --   --   --   --   --   HGB 9.6* 9.3* 9.1* 8.7* 7.8* 9.0* 9.0*  HCT 28.7* 28.3* 27.8* 26.2* 24.0* 27.2* 27.0*  MCV 84.4 84.0 83.7 84.5 83.9 84.2 83.6  PLT 315 306 349 380 400 476* 517*    INR: Recent Labs  Lab 05/23/17 0521 05/24/17 0416 05/25/17 0413 05/26/17 0425 05/27/17 0436  INR 2.01 2.08 2.56 2.53 2.33    Other results:  EKG:   Imaging: Dg Chest 2 View  Result Date: 05/26/2017 CLINICAL DATA:  LEFT ventricular assist device EXAM: CHEST - 2 VIEW COMPARISON:  05/20/2017 FINDINGS: RIGHT arm PICC line with tip projecting over SVC. LEFT ventricular assist device at cardiac apex. Prior median sternotomy. Enlargement of cardiac silhouette. Mediastinal contours and pulmonary vascularity normal. Bibasilar atelectasis. No acute infiltrate, pleural effusion or pneumothorax. IMPRESSION: Enlargement of cardiac silhouette post LVAD. Mild bibasilar atelectasis. Electronically Signed   By: Ulyses Southward M.D.   On: 05/26/2017 08:54     Medications:     Scheduled Medications: . allopurinol  100 mg Oral Daily  . amiodarone  200 mg Oral BID  . aspirin EC  81 mg Oral Daily  . bisacodyl  10 mg Oral Daily   Or  . bisacodyl  10 mg Rectal Daily  . chlorhexidine  15 mL Mouth/Throat BID  . clonazePAM  1 mg Oral QHS  . docusate sodium  200 mg Oral Daily  . feeding supplement (PRO-STAT SUGAR FREE 64)  30 mL Oral BID  . ferrous fumarate-b12-vitamic C-folic acid  1 capsule Oral BID  . fluconazole  100 mg Oral Daily  . furosemide  40 mg Oral BID  . guaiFENesin  600 mg Oral BID  . insulin aspart  0-15 Units Subcutaneous TID WC  . insulin aspart  0-5 Units Subcutaneous QHS  . pantoprazole  40 mg Oral Daily  . potassium chloride  40 mEq Oral Daily  . sodium chloride flush  10-40 mL Intracatheter Q12H  . warfarin  1 mg Oral ONCE-1800  . Warfarin - Pharmacist Dosing  Inpatient   Does not apply q1800    Infusions: . sodium chloride    . sodium chloride    . lactated ringers Stopped (05/22/17 0926)  . piperacillin-tazobactam (ZOSYN)  IV Stopped (05/27/17 0941)  . vancomycin Stopped (05/27/17 0531)    PRN Medications: acetaminophen, hydrALAZINE, levalbuterol, ondansetron (ZOFRAN) IV, oxyCODONE, sodium chloride flush, traMADol   Assessment:  47 year old with nonischemic cardiomyopathy EF 10% admitted with cardiogenic shock and acute on chronic systolic heart failure  HeartMate 3 implanted April 30, preop balloon pump removed  Large LV thrombus removed intact intraoperatively Postoperative bleeding from gumline at prior dental extraction site controlled with topical hemostatic agent and product administration  Patient with moderate RV dysfunction which required low-dose inotropic support - now off milrinone .   Plan/Discussion:   Patient will need to remain in  hospital until fever and white count resolve.,  Finished course of IV antibiotics.  Driveline exit site wound should be without drainage prior to discharge because of patient's social circumstances after leaving hospital   Recommend at least 5 days of IV antibiotics and continue mouth rinse with Peridex.  4 days of oral Diflucan ordered.   I reviewed the LVAD parameters from today, and compared the results to the patient's prior recorded data.  No programming changes were made.  The LVAD is functioning within specified parameters.  The patient performs LVAD self-test daily.  LVAD interrogation was negative for any significant power changes, alarms or PI events/speed drops.  LVAD equipment check completed and is in good working order.  Back-up equipment present.   LVAD education done on emergency procedures and precautions and reviewed exit site care.  Length of Stay: [address]  Kathlee Nations Chambers III 05/27/2017, 1:30 PM

## 2017-05-27 NOTE — Progress Notes (Addendum)
Patient ID: Benjamin Sherman, male   DOB: [birth year], 47 y.o.   MRN: [id number]    Advanced Heart Failure Rounding Note  Subjective:    Events: -Admitted 04/21/17 with cardiogenic shock and atrial flutter.  -Central line placed. Initial co-ox 39%. -IABP placed 04/25/17-> 04/30/17 with persistent cardiogenic shock.  -DCCV atrial flutter 4/12.   -S/p multiple teeth extractions 05/11/17.  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  -Extubated on 5/1  - driveline trauma- from repositioning in bed. 5/9 - 5/10 Received 1uRBCs on 5/10. - 5/10 Ramp echo with speed turned down to 5300.  CT Chest 05/23/17 resolution of R pleural effusion. Small left pleural effusion with suggestion of small loculated component over the lateral left upper lobe. Bibasilar linear atelectasis.   MAP 80-102  Sitting in chair/ Denies SOB or orthopnea. Co-ox 51%  Hgb stable at 9.0. Fever to 101 last night   LVAD Interrogation HM 3: Speed: 5300 Flow: 4.3  PI: 4.1  Power: 4.0  Objective:   Weight Range:  Vital Signs:   Temp:  [98.1 F (36.7 C)-101.2 F (38.4 C)] 99.8 F (37.7 C) (05/12 0800) Pulse Rate:  [88-97] 88 (05/12 0800) Resp:  [23-31] 25 (05/12 0800) BP: (90-123)/(59-94) 123/94 (05/12 0800) SpO2:  [97 %-100 %] 98 % (05/12 0800) Weight:  [89 kg (196 lb 3.2 oz)] 89 kg (196 lb 3.2 oz) (05/12 0434) Last BM Date: 05/25/17  Weight change: Filed Weights   05/25/17 0447 05/26/17 0523 05/27/17 0434  Weight: 90.4 kg (199 lb 4.7 oz) 89.2 kg (196 lb 10.4 oz) 89 kg (196 lb 3.2 oz)   Intake/Output:   Intake/Output Summary (Last 24 hours) at 05/27/2017 1041 Last data filed at 05/27/2017 0900 Gross per 24 hour  Intake 1060 ml  Output 1750 ml  Net -690 ml    Physical Exam   General:  NAD.  HEENT: normal  edentuolous Neck: supple. JVP not elevated.  Carotids 2+ bilat; no bruits. No lymphadenopathy or thryomegaly appreciated. Cor: LVAD hum.  Lungs: Clear. Abdomen: soft, nontender, non-distended. No hepatosplenomegaly. No bruits or  masses. Good bowel sounds. Driveline site clean. Anchor in place.  Extremities: no cyanosis, clubbing, rash. Warm no edema  + PICC Neuro: alert & oriented x 3. No focal deficits. Moves all 4 without problem    Telemetry    NSR 80s Personally reviewed  EKG   No new tracings.    Labs    Basic Metabolic Panel: Recent Labs  Lab 05/21/17 0453 05/22/17 0403 05/23/17 0521 05/24/17 0416 05/25/17 0413 05/26/17 0425 05/27/17 0436  NA 135 131* 129* 131* 132* 132* 132*  K 4.2 4.5 4.1 3.8 3.7 3.5 3.6  CL 97* 94* 93* 94* 95* 96* 96*  CO2 27 27 26 27 26 26 26   GLUCOSE 122* 150* 169* 133* 128* 122* 135*  BUN 22* 19 21* 26* 25* 20 16  CREATININE 1.10 1.09 1.06 1.20 1.33* 1.15 1.13  CALCIUM 9.0 8.9 8.6* 8.8* 8.5* 8.8* 8.7*  MG 1.8 1.8  --  1.9  --   --   --    Liver Function Tests: Recent Labs  Lab 05/23/17 0521 05/24/17 0416 05/25/17 0413 05/26/17 0425 05/27/17 0436  AST 21 21 23 21 23   ALT 13* 13* 12* 10* 12*  ALKPHOS 141* 131* 152* 177* 189*  BILITOT 1.2 1.6* 1.7* 1.8* 1.2  PROT 7.0 7.1 7.4 7.6 7.7  ALBUMIN 2.1* 2.1* 2.0* 2.1* 2.0*   No results for input(s): LIPASE, AMYLASE in the last 168 hours. No results  for input(s): AMMONIA in the last 168 hours.  CBC: Recent Labs  Lab 05/21/17 0453 05/22/17 0403 05/23/17 0521 05/24/17 0416 05/25/17 0413 05/26/17 0425 05/27/17 0436  WBC 12.3* 16.5* 19.6* 17.0* 14.9* 14.3* 12.8*  NEUTROABS 8.9* 12.9*  --   --   --   --   --   HGB 9.6* 9.3* 9.1* 8.7* 7.8* 9.0* 9.0*  HCT 28.7* 28.3* 27.8* 26.2* 24.0* 27.2* 27.0*  MCV 84.4 84.0 83.7 84.5 83.9 84.2 83.6  PLT 315 306 349 380 400 476* 517*   Cardiac Enzymes: No results for input(s): CKTOTAL, CKMB, CKMBINDEX, TROPONINI in the last 168 hours. BNP: BNP (last 3 results) Recent Labs    04/21/17 1212 05/16/17 0529 05/22/17 0403  BNP 483.0* 262.3* 221.3*   ProBNP (last 3 results) No results for input(s): PROBNP in the last 8760 hours.  Other results:  Imaging: Dg Chest 2  View  Result Date: 05/26/2017 CLINICAL DATA:  LEFT ventricular assist device EXAM: CHEST - 2 VIEW COMPARISON:  05/20/2017 FINDINGS: RIGHT arm PICC line with tip projecting over SVC. LEFT ventricular assist device at cardiac apex. Prior median sternotomy. Enlargement of cardiac silhouette. Mediastinal contours and pulmonary vascularity normal. Bibasilar atelectasis. No acute infiltrate, pleural effusion or pneumothorax. IMPRESSION: Enlargement of cardiac silhouette post LVAD. Mild bibasilar atelectasis. Electronically Signed   By: Ulyses Southward M.D.   On: 05/26/2017 08:54    Medications:    Scheduled Medications: . allopurinol  100 mg Oral Daily  . amiodarone  200 mg Oral BID  . aspirin EC  81 mg Oral Daily  . bisacodyl  10 mg Oral Daily   Or  . bisacodyl  10 mg Rectal Daily  . chlorhexidine  15 mL Mouth/Throat BID  . clonazePAM  1 mg Oral QHS  . docusate sodium  200 mg Oral Daily  . feeding supplement (PRO-STAT SUGAR FREE 64)  30 mL Oral BID  . ferrous fumarate-b12-vitamic C-folic acid  1 capsule Oral BID  . furosemide  40 mg Oral BID  . guaiFENesin  600 mg Oral BID  . insulin aspart  0-15 Units Subcutaneous TID WC  . insulin aspart  0-5 Units Subcutaneous QHS  . pantoprazole  40 mg Oral Daily  . potassium chloride  40 mEq Oral Daily  . sodium chloride flush  10-40 mL Intracatheter Q12H  . warfarin  2 mg Oral ONCE-1800  . Warfarin - Pharmacist Dosing Inpatient   Does not apply q1800    Infusions: . sodium chloride    . sodium chloride    . lactated ringers Stopped (05/22/17 0926)  . piperacillin-tazobactam (ZOSYN)  IV Stopped (05/27/17 0941)  . vancomycin Stopped (05/27/17 0531)    PRN Medications:    Assessment:   Benjamin Sherman is a 47 y.o. 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 eosinophilic cardiomyopathy, LV thrombus, hypothyroidism, DM2, CKD Stage II-III and traumatic [facility] 11/18 after fall.   Admitted with cardiogenic  shock.  Plan/Discussion:    1. S/P HMIII 4/30 under DT - continues to improve POD #11 - Volume status ok  - LDH 318 - Continue ASA 81 daily.  - Warfarin for goal INR 2-2.5 =>INR 2.33 Discussed dosing with PharmD personally. - Driveline was pulled while being repositioned in bed on 05/24/2017. Per VAD coordinator, velour is not exposed. Increased exudate noted. VAD coordinators following On zosyn.  - Speed adjusted to 5300 with ramp echo 5/10  2. Acute on chronic systolic HF -> cardiogenic shock:  Nonischemic cardiomyopathy. ECHO 11/2016 EF 25-30% CMRI EF 22%. Bedside echo in HF clinic EF 20% in 3/19.  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 Did not respond to steroids. Admitted 4/6 with cardiogenic shock and volume overload co-ox 38%. Echo 04/23/17: LVEF 20-25% with large LV clot, RV mild to moderately down.   - co-ox marginal at 51% - Volume status stable.  - Continue lasix 40 mg po BID. Weight stable.  May need to drop back soon  3. Atrial flutter: New onset. S/p DCCV to NSR on 4/12.  - Maintaining NSR  -Continue po amiodarone.  4. AKI CKD Stage II-III: Likely ATN due to shock.   - creatinine 1.13 5. H/o LV Thrombus -   - resected with VAD placement 6. DMII - Per TCTS.  - No change to current plan.   7. Hypothyroidism:  - TFTs OK 04/22/17. TSH elevated at 10.848 4/23. T4 and T3 normal.  - No change to current plan.   8. RLE erythema/cellulitis:  - Started doxy 4/20. Resolved.   9. ID - Had recurrent fever last night. - Covering with vanc/zosyn. - CT Chest 05/23/17 resolution of R pleural effusion. Small left pleural effusion with suggestion of small loculated component over the lateral left upper lobe. Bibasilar linear atelectasis.  - BCx - NGTD.     - WBC falling - Per Dr. Donata Clay will give course of diflucan 10. Anemia-post-op blood loss - improved s/p 1u RBC 5/10  Continue VAD education and ambulation.   [name], MD  05/27/2017 10:41 AM   Advanced  Heart Failure Team Pager [phone] (M-F; 7a - 4p)  Please contact [facility] for night-coverage after hours (4p -7a ) and weekends on [email or web address]

## 2017-05-27 NOTE — Progress Notes (Signed)
New blood cultures patient refusing them at this time. He agreed to have them done in the morning. I did explain the importance of them to the patient for proper treatment.

## 2017-05-27 NOTE — Progress Notes (Signed)
Pt. Temperature 102.9. Tylenol Given. MD notified. Will continue to monitor pt.

## 2017-05-27 NOTE — Progress Notes (Signed)
ANTICOAGULATION CONSULT NOTE - Follow-Up Consult  Pharmacy Consult for Warfarin Indication: LVAD  Allergies  Allergen Reactions  . Bee Venom     UNSPECIFIED REACTION     Patient Measurements: Height: 6' (182.9 cm) Weight: 196 lb 3.2 oz (89 kg) IBW/kg (Calculated) : 77.6 Heparin Dosing Weight: n/a  Vital Signs: Temp: 99.8 F (37.7 C) (05/12 0800) Temp Source: Oral (05/12 0800) BP: 123/94 (05/12 0800) Pulse Rate: 88 (05/12 0800)  Labs: Recent Labs    05/25/17 0413 05/26/17 0425 05/27/17 0436  HGB 7.8* 9.0* 9.0*  HCT 24.0* 27.2* 27.0*  PLT 400 476* 517*  LABPROT 27.3* 27.1* 25.4*  INR 2.56 2.53 2.33  CREATININE 1.33* 1.15 1.13    Estimated Creatinine Clearance: 87.7 mL/min (by C-G formula based on SCr of 1.13 mg/dL).   Medical History: Past Medical History:  Diagnosis Date  . CHF (congestive heart failure) (HCC)   . Diabetes mellitus without complication [facility])     Assessment: 47 yo male s/p LVAD placement 4/30 on warfarin per pharmacy.   INR continues to be at goal today at 2.3, LDH stable, Hgb stable at 9, no blood overnight.   Goal of Therapy:  INR goal 2-2.5 Monitor platelets by anticoagulation protocol: Yes   Plan:  -Warfarin 2mg  PO x1 again tonight -Daily INR  Sheppard Coil PharmD., BCPS Clinical Pharmacist 05/27/2017 8:46 AM

## 2017-05-28 ENCOUNTER — Inpatient Hospital Stay (HOSPITAL_COMMUNITY): Payer: Medicaid Other

## 2017-05-28 LAB — GLUCOSE, CAPILLARY
Glucose-Capillary: 134 mg/dL — ABNORMAL HIGH (ref 65–99)
Glucose-Capillary: 117 mg/dL — ABNORMAL HIGH (ref 65–99)
Glucose-Capillary: 111 mg/dL — ABNORMAL HIGH (ref 65–99)
Glucose-Capillary: 121 mg/dL — ABNORMAL HIGH (ref 65–99)
Glucose-Capillary: 145 mg/dL — ABNORMAL HIGH (ref 65–99)

## 2017-05-28 LAB — CBC
HCT: 28.3 % — ABNORMAL LOW (ref 39.0–52.0)
Hemoglobin: 9.2 g/dL — ABNORMAL LOW (ref 13.0–17.0)
MCH: 27.3 pg (ref 26.0–34.0)
MCHC: 32.5 g/dL (ref 30.0–36.0)
MCV: 84 fL (ref 78.0–100.0)
Platelets: 510 10*3/uL — ABNORMAL HIGH (ref 150–400)
RBC: 3.37 MIL/uL — ABNORMAL LOW (ref 4.22–5.81)
RDW: 15.8 % — ABNORMAL HIGH (ref 11.5–15.5)
WBC: 11.7 10*3/uL — ABNORMAL HIGH (ref 4.0–10.5)

## 2017-05-28 LAB — PROTIME-INR
INR: 1.94
Prothrombin Time: 22 seconds — ABNORMAL HIGH (ref 11.4–15.2)

## 2017-05-28 LAB — COMPREHENSIVE METABOLIC PANEL
ALT: 13 U/L — ABNORMAL LOW (ref 17–63)
AST: 25 U/L (ref 15–41)
Albumin: 2.3 g/dL — ABNORMAL LOW (ref 3.5–5.0)
Alkaline Phosphatase: 187 U/L — ABNORMAL HIGH (ref 38–126)
Anion gap: 10 (ref 5–15)
BUN: 17 mg/dL (ref 6–20)
CO2: 26 mmol/L (ref 22–32)
Calcium: 8.8 mg/dL — ABNORMAL LOW (ref 8.9–10.3)
Chloride: 96 mmol/L — ABNORMAL LOW (ref 101–111)
Creatinine, Ser: 1.28 mg/dL — ABNORMAL HIGH (ref 0.61–1.24)
GFR calc Af Amer: >60 mL/min (ref >60)
GFR calc non Af Amer: >60 mL/min (ref >60)
Glucose, Bld: 113 mg/dL — ABNORMAL HIGH (ref 65–99)
Potassium: 3.8 mmol/L (ref 3.5–5.1)
Sodium: 132 mmol/L — ABNORMAL LOW (ref 135–145)
Total Bilirubin: 1.6 mg/dL — ABNORMAL HIGH (ref 0.3–1.2)
Total Protein: 8.2 g/dL — ABNORMAL HIGH (ref 6.5–8.1)

## 2017-05-28 LAB — LACTATE DEHYDROGENASE: LDH: 336 U/L — ABNORMAL HIGH (ref 98–192)

## 2017-05-28 MED ORDER — SODIUM CHLORIDE 0.9 % IV SOLN
1.0000 g | Freq: Three times a day (TID) | INTRAVENOUS | Status: AC
  Administered 2017-05-28 – 2017-06-01 (×14): 1 g via INTRAVENOUS
  Filled 2017-05-28 (×14): qty 1

## 2017-05-28 MED ORDER — WARFARIN SODIUM 2.5 MG PO TABS
2.5000 mg | ORAL_TABLET | Freq: Once | ORAL | Status: AC
  Administered 2017-05-28: 2.5 mg via ORAL
  Filled 2017-05-28: qty 1

## 2017-05-28 MED ORDER — AMIODARONE HCL 200 MG PO TABS
200.0000 mg | ORAL_TABLET | Freq: Every day | ORAL | Status: DC
  Administered 2017-05-28 – 2017-06-04 (×8): 200 mg via ORAL
  Filled 2017-05-28 (×7): qty 1

## 2017-05-28 MED ORDER — WARFARIN SODIUM 2 MG PO TABS: 2.0000 mg | ORAL_TABLET | Freq: Once | ORAL | Status: DC

## 2017-05-28 NOTE — Progress Notes (Signed)
OT Cancellation Note  Patient Details Name: Benjamin Sherman MRN: [id number] DOB: 16-Jun-1970   Cancelled Treatment:    Reason Eval/Treat Not Completed: Patient at procedure or test/ unavailable.  Pt undergoing VAD training with coordinator.  Will reattempt.  Dong Nimmons [place], OTR/L [phone]   Jeani Hawking M 05/28/2017, 11:27 AM

## 2017-05-28 NOTE — Progress Notes (Signed)
Drive line site care: Existing VAD dressing removed and site care performed using sterile technique. Drive line exit site cleaned with Chlora prep applicators x 2, allowed to dry, and gauze dressing with silver strip re-applied. Exit site with no tissue ingrowth, one suture intact, the velour is fully implanted at exit site. Moderate amount of bloody/brown drainage, slighty tender when cleaning over tunneled driveline portion, no foul odor, or rash noted. Anchor intact.  Patient has been independent with management of driveline/controller today.  Continue to reinforce.

## 2017-05-28 NOTE — Progress Notes (Signed)
LVAD Coordinator Education Note:   Completed LVAD education today by reviewing:  Emergency Situations: xDefining HMIII LVAD Emergency xSteps to take during an emergency xHow to identify power/connection problems xEmergency Telephone Contacts xEmergency Transportation Pan  Hands On Training for Patient and Support Person Competency: xMaking and Breaking connections, one at a time xSwitching power sources (MPU to Battery, Battery to [employer]) xChanging batteries, one at a time xPerforming System controller self-test (daily) xTroubleshooting advisories/alarms   Informational Materials have been provided and reviewed in entirety: xD/C Instructions Sheet xList of emergency contacts xGuide to Alerts and Alarms xPatient Handbook x 2 xGuide to Replacing System Controller xHome Log Sheets xLVAD Emergency Contact Sheets    Reinforced that at each clinic visit they will bring medications/list of medications and log book entries of parameters/weights.   Benjamin Sherman and his caregiver Benjamin Sherman confirmed adequate training and answering of all questions at this time. They passed the HeartMate III Discharge Test at 94%, and incorrect answers were reviewed. Practiced paging the pager with Benjamin Sherman and Benjamin Sherman today. They at this time feel ready for D/C home.  Observed Benjamin Sherman change the driveline dressing today.  Existing VAD dressing removed and site care performed using sterile technique. Drive line exit site cleaned with Chlora prep applicators x 2, allowed to dry, and gauze dressing with silver strip re-applied. Exit site with no tissue  ingrowth, one suture intact, the velour is fully implanted at exit site. Small amount of bloody/brown drainage, no foul odor, or rash noted. Anchor intact. Benjamin Sherman checked off on dressing change.      Total Elapsed Time: 2.5 hours  [name] RN, BSN VAD Coordinator 24/7 Pager [phone]

## 2017-05-28 NOTE — Progress Notes (Addendum)
ANTICOAGULATION CONSULT NOTE - Follow-Up Consult  Pharmacy Consult for Warfarin Indication: LVAD  Allergies  Allergen Reactions  . Bee Venom     UNSPECIFIED REACTION     Patient Measurements: Height: 6' (182.9 cm) Weight: 195 lb 9.6 oz (88.7 kg) IBW/kg (Calculated) : 77.6 Heparin Dosing Weight: n/a  Vital Signs: Temp: 98.9 F (37.2 C) (05/13 0700) Temp Source: Oral (05/13 0700) BP: 100/69 (05/13 0400) Pulse Rate: 62 (05/13 0700)  Labs: Recent Labs    05/26/17 0425 05/27/17 0436 05/28/17 0610  HGB 9.0* 9.0* 9.2*  HCT 27.2* 27.0* 28.3*  PLT 476* 517* 510*  LABPROT 27.1* 25.4* 22.0*  INR 2.53 2.33 1.94  CREATININE 1.15 1.13 1.28*    Estimated Creatinine Clearance: 77.5 mL/min (A) (by C-G formula based on SCr of 1.28 mg/dL (H)).   Medical History: Past Medical History:  Diagnosis Date  . CHF (congestive heart failure) (HCC)   . Diabetes mellitus without complication [facility])     Assessment: 47 yo male s/p LVAD placement 4/30 on warfarin per pharmacy.   INR drifting down slightly to below goal at 1.9, LDH trending up in 330s today, Hgb stable in 9s, no overt bleeding overnight. He did show [place] on rounds a clot he coughed up, this improved over the weekend from multiple to just one this am.   Fluconazole added yesterday by surgery x3 days, was cautious with warfarin dose but he will need extra tonight. He is also on chronic amio which was reduced to 200mg /d this am.   Patient continues on vancomycin and zosyn for possible driveline infection. Will discuss with team and surgery the plan for antibiotics going forward. Temp overnight, wbc 11, and renal function has remained stable.   Goal of Therapy:  INR goal 2-2.5 Monitor platelets by anticoagulation protocol: Yes   Plan:  -Warfarin 2.5mg  PO x1 again tonight -Daily INR -Follow up abx plan with team  Sheppard Coil PharmD., BCPS Clinical Pharmacist 05/28/2017 8:35 AM

## 2017-05-28 NOTE — Progress Notes (Signed)
Drive line site care: Existing VAD dressing removed and site care performed using sterile technique. Drive line exit site cleaned with Chlora prep applicators x 2, allowed to dry, and gauze dressing with silver strip re-applied. Exit site with no tissue ingrowth, one suture intact, the velour is fully implanted at exit site. Small amount of bloody/brown drainage, slighty tender when cleaning over tunneled driveline portion, no foul odor, or rash noted. Anchor intact. Pt educated today on the importance of owning his controller and batteries. Pt instructed that anytime he is moving in bed or getting up from the chair to the bed or bed to the chair he must make sure he has his equipment secure. Patient has been independent with these tasks today.  Continue to reinforce.

## 2017-05-28 NOTE — Progress Notes (Addendum)
Patient ID: Benjamin Sherman, male   DOB: 05-04-1970, 47 y.o.   MRN: [id number]    Advanced Heart Failure Rounding Note  Subjective:    Events: -Admitted 04/21/17 with cardiogenic shock and atrial flutter.  -Central line placed. Initial co-ox 39%. -IABP placed 04/25/17-> 04/30/17 with persistent cardiogenic shock.  -DCCV atrial flutter 4/12.   -S/p multiple teeth extractions 05/11/17.  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  -Extubated on 5/1  - driveline trauma- from repositioning in bed. 5/9 - 5/10 Received 1uRBCs on 5/10. - 5/10 Ramp echo with speed turned down to 5300.  CT Chest 05/23/17 resolution of R pleural effusion. Small left pleural effusion with suggestion of small loculated component over the lateral left upper lobe. Bibasilar linear atelectasis.   Yesterday started on diflucan for fever. PICC line was removed. Remains on Vanc and Zosyn. Blood cultures sent this morning.  Febrile again over night 102.9.   Coughing up blood from his mouth for the last 3 mornings. He says clots are smaller today. Feels ok. Ambulating with walker.   Denies SOB.   LVAD Interrogation HM 3: Speed: 5300 Flow: 4.2  PI: 3.7  Power: 4. No PI events. Personally reviewed   Objective:   Weight Range:  Vital Signs:   Temp:  [98.9 F (37.2 C)-102.9 F (39.4 C)] 98.9 F (37.2 C) (05/13 0700) Pulse Rate:  [29-101] 62 (05/13 0700) Resp:  [11-30] 30 (05/13 0700) BP: (89-107)/(66-91) 100/69 (05/13 0400) SpO2:  [95 %-100 %] 100 % (05/13 0700) Weight:  [195 lb 9.6 oz (88.7 kg)] 195 lb 9.6 oz (88.7 kg) (05/13 0600) Last BM Date: 05/25/17  Weight change: Filed Weights   05/26/17 0523 05/27/17 0434 05/28/17 0600  Weight: 196 lb 10.4 oz (89.2 kg) 196 lb 3.2 oz (89 kg) 195 lb 9.6 oz (88.7 kg)   Intake/Output:   Intake/Output Summary (Last 24 hours) at 05/28/2017 0834 Last data filed at 05/28/2017 0400 Gross per 24 hour  Intake 720 ml  Output 1950 ml  Net -1230 ml    Physical Exam   Physical Exam: GENERAL: NAD.  HEENT:  normal anicteric NECK: Supple, JVP  5-6 .  2+ bilaterally, no bruits.  No lymphadenopathy or thyromegaly appreciated.   CARDIAC:  Mechanical heart sounds with LVAD hum present.  LUNGS:  Clear to auscultation bilaterally. No wheeze ABDOMEN:  Soft, round, nontender, positive bowel sounds x4.     LVAD exit site:   Dressing with strike through on the dressing. No erythema or drainage.  Stabilization device present and accurately applied.  Driveline dressing is being changed daily per sterile technique. EXTREMITIES:  Warm and dry, no cyanosis, clubbing, rash or edema . RUE PICC  Neuro: alert & oriented x 3, cranial nerves grossly intact. moves all 4 extremities w/o difficulty. Affect pleasant   Telemetry    NSR 80s personally reviewed.   EKG   No new tracings.    Labs    Basic Metabolic Panel: Recent Labs  Lab 05/22/17 0403  05/24/17 0416 05/25/17 0413 05/26/17 0425 05/27/17 0436 05/28/17 0610  NA 131*   < > 131* 132* 132* 132* 132*  K 4.5   < > 3.8 3.7 3.5 3.6 3.8  CL 94*   < > 94* 95* 96* 96* 96*  CO2 27   < > 27 26 26 26 26   GLUCOSE 150*   < > 133* 128* 122* 135* 113*  BUN 19   < > 26* 25* 20 16 17   CREATININE 1.09   < >  1.20 1.33* 1.15 1.13 1.28*  CALCIUM 8.9   < > 8.8* 8.5* 8.8* 8.7* 8.8*  MG 1.8  --  1.9  --   --   --   --    < > = values in this interval not displayed.   Liver Function Tests: Recent Labs  Lab 05/24/17 0416 05/25/17 0413 05/26/17 0425 05/27/17 0436 05/28/17 0610  AST 21 23 21 23 25   ALT 13* 12* 10* 12* 13*  ALKPHOS 131* 152* 177* 189* 187*  BILITOT 1.6* 1.7* 1.8* 1.2 1.6*  PROT 7.1 7.4 7.6 7.7 8.2*  ALBUMIN 2.1* 2.0* 2.1* 2.0* 2.3*   No results for input(s): LIPASE, AMYLASE in the last 168 hours. No results for input(s): AMMONIA in the last 168 hours.  CBC: Recent Labs  Lab 05/22/17 0403  05/24/17 0416 05/25/17 0413 05/26/17 0425 05/27/17 0436 05/28/17 0610  WBC 16.5*   < > 17.0* 14.9* 14.3* 12.8* 11.7*  NEUTROABS 12.9*  --   --    --   --   --   --   HGB 9.3*   < > 8.7* 7.8* 9.0* 9.0* 9.2*  HCT 28.3*   < > 26.2* 24.0* 27.2* 27.0* 28.3*  MCV 84.0   < > 84.5 83.9 84.2 83.6 84.0  PLT 306   < > 380 400 476* 517* 510*   < > = values in this interval not displayed.   Cardiac Enzymes: No results for input(s): CKTOTAL, CKMB, CKMBINDEX, TROPONINI in the last 168 hours. BNP: BNP (last 3 results) Recent Labs    04/21/17 1212 05/16/17 0529 05/22/17 0403  BNP 483.0* 262.3* 221.3*   ProBNP (last 3 results) No results for input(s): PROBNP in the last 8760 hours.  Other results:  Imaging: No results found.  Medications:    Scheduled Medications: . allopurinol  100 mg Oral Daily  . amiodarone  200 mg Oral BID  . aspirin EC  81 mg Oral Daily  . bisacodyl  10 mg Oral Daily   Or  . bisacodyl  10 mg Rectal Daily  . chlorhexidine  15 mL Mouth/Throat BID  . clonazePAM  1 mg Oral QHS  . docusate sodium  200 mg Oral Daily  . feeding supplement (PIVOT 1.5 CAL)  237 mL Per Tube TID BM  . feeding supplement (PRO-STAT SUGAR FREE 64)  30 mL Oral BID  . ferrous fumarate-b12-vitamic C-folic acid  1 capsule Oral BID  . fluconazole  100 mg Oral Daily  . furosemide  40 mg Oral BID  . guaiFENesin  600 mg Oral BID  . insulin aspart  0-15 Units Subcutaneous TID WC  . insulin aspart  0-5 Units Subcutaneous QHS  . pantoprazole  40 mg Oral Daily  . potassium chloride  40 mEq Oral Daily  . sodium chloride flush  10-40 mL Intracatheter Q12H  . Warfarin - Pharmacist Dosing Inpatient   Does not apply q1800    Infusions: . sodium chloride    . sodium chloride    . lactated ringers Stopped (05/22/17 0926)  . piperacillin-tazobactam (ZOSYN)  IV 3.375 g (05/28/17 0724)  . vancomycin Stopped (05/28/17 0705)    PRN Medications:    Assessment:   Benjamin Sherman is a 47 y.o. 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 eosinophilic cardiomyopathy, LV thrombus, hypothyroidism, DM2, CKD Stage II-III and  traumatic [facility] 11/18 after fall.   Admitted with cardiogenic shock.  Plan/Discussion:    1. S/P HMIII 4/30 under  DT - Volume status ok  - LDH 336 - Continue ASA 81 daily.  - Warfarin for goal INR 2-2.5 =>INR 1.9  - Discussed with pharmacy.  - Driveline was pulled while being repositioned in bed on 05/24/2017. Per VAD coordinator, velour is not exposed. Increased exudate noted. VAD coordinators following On zosyn.  - Speed adjusted to 5300 with ramp echo 5/10  2. Acute on chronic systolic HF -> cardiogenic shock:  Nonischemic cardiomyopathy. ECHO 11/2016 EF 25-30% CMRI EF 22%. Bedside echo in HF clinic EF 20% in 3/19.  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 Did not respond to steroids. Admitted 4/6 with cardiogenic shock and volume overload co-ox 38%. Echo 04/23/17: LVEF 20-25% with large LV clot, RV mild to moderately down.   -PICC out 5/12.  - Volume status stable.  - Continue lasix 40 mg po BID. 3. Atrial flutter: New onset. S/p DCCV to NSR on 4/12.  - Maintaining NSR.  -Cut back amio to 200 mg dialy.  4. AKI CKD Stage II-III: Likely ATN due to shock.   - creatinine 1.28  5. H/o LV Thrombus -   - resected with VAD placement 6. DMII - Per TCTS.  - No change to current plan.   7. Hypothyroidism:  - TFTs OK 04/22/17. TSH elevated at 10.848 4/23. T4 and T3 normal.  - No change to current plan.   8. RLE erythema/cellulitis:  9. ID - Tmax 99.7  - Covering with vanc/zosyn. - CT Chest 05/23/17 resolution of R pleural effusion. Small left pleural effusion with suggestion of small loculated component over the lateral left upper lobe. Bibasilar linear atelectasis.  - Blood CX 5/13 ---> cultures pending.    -WBC trending down  - Per Dr. Donata Clay will give course of diflucan. Day 2/3  10. Anemia-post-op blood loss - improved s/p 1u RBC 5/10 - Hgb 9.2 -third day of hemoptysis.   VAD coordinators planning to continue education with his caregiver at 10:00 today.   Tonye Becket,  NP  05/28/2017 8:34 AM   Advanced Heart Failure Team Pager [phone] (M-F; 7a - 4p)  Please contact [facility] for night-coverage after hours (4p -7a ) and weekends on [email or web address]   Patient seen and examined with Tonye Becket, NP. We discussed all aspects of the encounter. I agree with the assessment and plan as stated above.   Febrile again overnight up to 102 despite vanc/zosyn. PICC line removed. Started on fluconazole. WBC dropping. No clear source of infection. Will d/w PharmD about possible switch to meropenem while awaiting culture data. Volume status ok. VAD interrogated personally. Parameters stable. Still with some mild bleeding from teeth extraction site.   [name], MD  1:41 PM

## 2017-05-28 NOTE — Progress Notes (Signed)
LVAD Coordinator Rounding Note:  Admitted 04/21/17 with cardiogenic shock and atrial flutter.   HM III LVAD implanted on 05/15/17 by Dr. Maren Beach  under Destination Therapy criteria due to limited social support.   Pt sitting up in the chair. Pt will receive 1 unit of blood today.   Vital signs: Temp:  98.6 HR: 90 Auto BP:  88/74 (81) Doppler: 82 O2 Sat:  98% on RA Wt: 195>211>212>208>205>207>200>217(inaccurate wt)>198>199>195 lbs   LVAD interrogation reveals:  Speed: 5300 Flow: 4.1 Power:  3.9w PI:  4.2 Alarms: none Events: none Hematocrit: 28 Fixed speed: 5300 Low speed limit: 5000  Drive line site care: Will change with Gene during education today. Anchor intact. Pt educated today on the importance of owning his controller and batteries. Pt instructed that anytime he is moving in bed or getting up from the chair to the bed or bed to the chair he must make sure he has his equipment secure.   Labs:  LDH trend: 253>317>287>263>244>265>265>278>270>336  INR trend: 1.24>1.68>2.19>2.29>2.12>1.96>2.04>2.01>2.56>1.94  WBC trend: 16.5>16.1>15.5>11.7>12.3>16.5>19.6>14.9>11.7  Anticoagulation Plan: -INR Goal: 2.0 - 2.5  -ASA Dose: 81 mg (started 05/18/17 with therapeutic INR)  Blood Products:   Intra op: 05/15/17 - 1 unit plts - 1 unit FFP  Post op:  - 05/15/17>1 RBC, 2 FFP, DDAVP, Factor VII -05/16/17>1 PC - 05/25/17 > 1 PC  Device: N/A  Arrythmias: increased ectopy, short run Afib 05/15/17 - Amiodarone started   Respiratory: extubated 05/16/17  Nitric Oxide: off 05/16/17  Gtts: Milrinone> stop 05/22/17 Levo>8 mcg/min-off 5/2 Epi>1 mcg/min-off 5/3 Amiodarone>30 mg/hr-off 5/2 Lasix 6 mg/hr-off 5/3  Infection: - BC drawn 05/22/17>NTD - Vanc and Zosyn started 05/22/17 for temp 101.3 - CT scan 5/8/9 to r/o infection  Adverse Events on VAD: -none  VAD Education:  1. Delivered VAD Patient Binder and reviewed contents with patient and sister [name]). 2. Home Logbook  delivered, reviewed with pt how to fill out and ask him to start completing daily. 3. Angel observed dressing change, explained steps. She says she "won't be doing", but just wanted to observe.  4. Spoke with Gene (primary caregiver) this am, supposed to be coming for education at 10am. Gene informed today that education must be complete before the pt can be discharged to home.    Plan/Recommendations:  1. Pt needs to complete reading HM III Handbook and review Discharge Binder.   2. Pt needs to complete Home VAD Logbook daily.  3. Discharge education planned for today at 1000 am. 4. Call VAD pager if any questions re: VAD equipment or drive line site/care.  [name] RN, VAD Coordinator 24/7 VAD pager: [phone]

## 2017-05-28 NOTE — Progress Notes (Signed)
CARDIAC REHAB PHASE I   PRE:  Rate/Rhythm: 97 SR    BP: sitting 74     SaO2: wouldn't register  MODE:  Ambulation: 860 ft   POST:  Rate/Rhythm: 103 ST    BP: sitting 86     SaO2: 100 RA  Pt on batteries on my arrival. Put batteries in pockets of vest however could not figure out how to put vest on appropriately. Stood independently and walked with RW. Steady, no c/o. Return to recliner, left on batteries, planning bath later. Had pt practice IS.  [phone]  Harriet Masson CES, ACSM 05/28/2017 3:12 PM

## 2017-05-29 ENCOUNTER — Inpatient Hospital Stay (HOSPITAL_COMMUNITY): Payer: Medicaid Other

## 2017-05-29 DIAGNOSIS — M272 Inflammatory conditions of jaws: Secondary | ICD-10-CM

## 2017-05-29 DIAGNOSIS — Z98818 Other dental procedure status: Secondary | ICD-10-CM

## 2017-05-29 DIAGNOSIS — Z87891 Personal history of nicotine dependence: Secondary | ICD-10-CM

## 2017-05-29 DIAGNOSIS — Z8249 Family history of ischemic heart disease and other diseases of the circulatory system: Secondary | ICD-10-CM

## 2017-05-29 DIAGNOSIS — R509 Fever, unspecified: Secondary | ICD-10-CM

## 2017-05-29 DIAGNOSIS — F1099 Alcohol use, unspecified with unspecified alcohol-induced disorder: Secondary | ICD-10-CM

## 2017-05-29 DIAGNOSIS — Z91030 Bee allergy status: Secondary | ICD-10-CM

## 2017-05-29 DIAGNOSIS — K143 Hypertrophy of tongue papillae: Secondary | ICD-10-CM

## 2017-05-29 LAB — CBC
HCT: 31.1 % — ABNORMAL LOW (ref 39.0–52.0)
Hemoglobin: 10.1 g/dL — ABNORMAL LOW (ref 13.0–17.0)
MCH: 27.4 pg (ref 26.0–34.0)
MCHC: 32.5 g/dL (ref 30.0–36.0)
MCV: 84.3 fL (ref 78.0–100.0)
Platelets: 596 10*3/uL — ABNORMAL HIGH (ref 150–400)
RBC: 3.69 MIL/uL — ABNORMAL LOW (ref 4.22–5.81)
RDW: 15.7 % — ABNORMAL HIGH (ref 11.5–15.5)
WBC: 8.8 10*3/uL (ref 4.0–10.5)

## 2017-05-29 LAB — COMPREHENSIVE METABOLIC PANEL
ALT: 13 U/L — ABNORMAL LOW (ref 17–63)
AST: 26 U/L (ref 15–41)
Albumin: 2.3 g/dL — ABNORMAL LOW (ref 3.5–5.0)
Alkaline Phosphatase: 201 U/L — ABNORMAL HIGH (ref 38–126)
Anion gap: 10 (ref 5–15)
BUN: 15 mg/dL (ref 6–20)
CO2: 27 mmol/L (ref 22–32)
Calcium: 8.9 mg/dL (ref 8.9–10.3)
Chloride: 95 mmol/L — ABNORMAL LOW (ref 101–111)
Creatinine, Ser: 1.18 mg/dL (ref 0.61–1.24)
GFR calc Af Amer: >60 mL/min (ref >60)
GFR calc non Af Amer: >60 mL/min (ref >60)
Glucose, Bld: 113 mg/dL — ABNORMAL HIGH (ref 65–99)
Potassium: 3.6 mmol/L (ref 3.5–5.1)
Sodium: 132 mmol/L — ABNORMAL LOW (ref 135–145)
Total Bilirubin: 1.3 mg/dL — ABNORMAL HIGH (ref 0.3–1.2)
Total Protein: 8.4 g/dL — ABNORMAL HIGH (ref 6.5–8.1)

## 2017-05-29 LAB — GLUCOSE, CAPILLARY
Glucose-Capillary: 121 mg/dL — ABNORMAL HIGH (ref 65–99)
Glucose-Capillary: 134 mg/dL — ABNORMAL HIGH (ref 65–99)
Glucose-Capillary: 127 mg/dL — ABNORMAL HIGH (ref 65–99)
Glucose-Capillary: 129 mg/dL — ABNORMAL HIGH (ref 65–99)

## 2017-05-29 LAB — HEPARIN LEVEL (UNFRACTIONATED)
Heparin Unfractionated: 0.22 IU/mL — ABNORMAL LOW (ref 0.30–0.70)
Heparin Unfractionated: 0.43 IU/mL (ref 0.30–0.70)

## 2017-05-29 LAB — PROTIME-INR
INR: 1.62
Prothrombin Time: 19.1 seconds — ABNORMAL HIGH (ref 11.4–15.2)

## 2017-05-29 MED ORDER — IOHEXOL 300 MG/ML  SOLN
100.0000 mL | Freq: Once | INTRAMUSCULAR | Status: AC | PRN
  Administered 2017-05-29: 100 mL via INTRAVENOUS

## 2017-05-29 MED ORDER — HEPARIN (PORCINE) IN NACL 100-0.45 UNIT/ML-% IJ SOLN
1450.0000 [IU]/h | INTRAMUSCULAR | Status: DC
  Administered 2017-05-29: 1350 [IU]/h via INTRAVENOUS
  Administered 2017-06-02 (×2): 1450 [IU]/h via INTRAVENOUS
  Filled 2017-05-29 (×7): qty 250

## 2017-05-29 MED ORDER — POTASSIUM CHLORIDE CRYS ER 20 MEQ PO TBCR
40.0000 meq | EXTENDED_RELEASE_TABLET | Freq: Once | ORAL | Status: AC
  Administered 2017-05-29: 40 meq via ORAL
  Filled 2017-05-29: qty 2

## 2017-05-29 MED ORDER — WARFARIN SODIUM 4 MG PO TABS
4.0000 mg | ORAL_TABLET | Freq: Once | ORAL | Status: AC
  Administered 2017-05-29: 4 mg via ORAL
  Filled 2017-05-29: qty 1

## 2017-05-29 NOTE — Progress Notes (Signed)
LVAD Coordinator Rounding Note:  Admitted 04/21/17 with cardiogenic shock and atrial flutter.   HM III LVAD implanted on 05/15/17 by Dr. Maren Beach  under Destination Therapy criteria due to limited social support.   Pt sitting up in the chair. Pt states that he is very depressed and is ready to go home. Pt refused to have his labs drawn this morning.   Vital signs: Temp:  99.1 HR: 96 Auto BP:  87/73 (78) Doppler: 89 O2 Sat:  100% on RA Wt: 195>211>212>208>205>207>200>217(inaccurate wt)>198>199>195>196 lbs   LVAD interrogation reveals:  Speed: 5300 Flow: 4.2 Power:  3.7w PI:  4.0 Alarms: none Events: none Hematocrit: 28 Fixed speed: 5300 Low speed limit: 5000  Drive line site care: Existing VAD dressing removed and site care performed using sterile technique. Drive line exit site cleaned with Chlora prep applicators x 2, allowed to dry, and gauze dressing with silver strip re-applied. Exit site with no tissue ingrowth, one suture intact, the velour is fully implanted at exit site.Small amount of serousangineous drainage,nofoul odor, or rash noted. Anchor intact.      Labs:  LDH trend: 253>317>287>263>244>265>265>278>270>336  INR trend: 1.24>1.68>2.19>2.29>2.12>1.96>2.04>2.01>2.56>1.94  WBC trend: 16.5>16.1>15.5>11.7>12.3>16.5>19.6>14.9>11.7  Anticoagulation Plan: -INR Goal: 2.0 - 2.5  -ASA Dose: 81 mg (started 05/18/17 with therapeutic INR)  Blood Products:   Intra op: 05/15/17 - 1 unit plts - 1 unit FFP  Post op:  - 05/15/17>1 RBC, 2 FFP, DDAVP, Factor VII -05/16/17>1 PC - 05/25/17 > 1 PC  Device: N/A  Arrythmias: increased ectopy, short run Afib 05/15/17 - Amiodarone started   Respiratory: extubated 05/16/17  Nitric Oxide: off 05/16/17  Gtts: Milrinone> stop 05/22/17 Levo>8 mcg/min-off 5/2 Epi>1 mcg/min-off 5/3 Amiodarone>30 mg/hr-off 5/2 Lasix 6 mg/hr-off 5/3  Infection: - BC drawn 05/22/17>NTD - Vanc and Zosyn started 05/22/17 for temp 101.3 - CT scan 5/8/9 to  r/o infection  Adverse Events on VAD: -none  VAD Education:  1. Education completed with Benjamin Sherman and pt on 05/28/17.  Plan/Recommendations:   1. Pt needs to complete Home VAD Logbook daily.  2. Discharge education complete. 3. Call VAD pager if any questions re: VAD equipment or drive line site/care.  [name] RN, VAD Coordinator 24/7 VAD pager: [phone]

## 2017-05-29 NOTE — Progress Notes (Signed)
Physical Therapy Treatment Patient Details Name: Benjamin Sherman MRN: [id number] DOB: 07/24/1970 Today's Date: 05/29/2017    History of Present Illness 47 yo admitted with cardiogenic shock, s/p RHC and IABP insertion with continued shock 4/29 , teeth extraction 4/26, HM111 4/30, extubated 5/1. PMHx: HF, cardiomyopathy, LV thrombus, hypothyroidism, DM, CKD, traumatic [facility] 11/2016    PT Comments    Pt initially hesitant to mobilize as he was comfortable in bed but  With encouragement pt able to participate in therapy, increase gait distance and walk without RW. Pt continues to struggle with power transition needing mod cues for sequence and completion of task. Pt continues to need cues to maintain sternal precautions with mobility as well. Will continue to follow and encouraged mobility with nursing but only transitioning power under supervision of staff.   HR 92-99 when tele would pick up VSS BP 87/73 (78)  Follow Up Recommendations  Home health PT;Supervision/Assistance - 24 hour     Equipment Recommendations  Rolling walker with 5" wheels;3in1 (PT)    Recommendations for Other Services       Precautions / Restrictions Precautions Precautions: Sternal Precaution Comments: LVAD    Mobility  Bed Mobility Overal bed mobility: Needs Assistance Bed Mobility: Sidelying to Sit   Sidelying to sit: Supervision       General bed mobility comments: cues for arm placement to maintain precautions  Transfers Overall transfer level: Needs assistance   Transfers: Sit to/from Stand Sit to Stand: Min guard         General transfer comment: cues for hand placement on thighs and to not push into surface to scoot to EOB  Ambulation/Gait Ambulation/Gait assistance: Min guard Ambulation Distance (Feet): 700 Feet Assistive device: None Gait Pattern/deviations: Step-through pattern;Decreased stride length   Gait velocity interpretation: 1.31 - 2.62 ft/sec, indicative of limited  community ambulator General Gait Details: guarding without RW with 2 periods of pt skimming wall on right and one partial LOB   Stairs             Wheelchair Mobility    Modified Rankin (Stroke Patients Only)       Balance Overall balance assessment: Needs assistance   Sitting balance-Leahy Scale: Good       Standing balance-Leahy Scale: Good                              Cognition Arousal/Alertness: Awake/alert Behavior During Therapy: WFL for tasks assessed/performed Overall Cognitive Status: Impaired/Different from baseline Area of Impairment: Memory                     Memory: Decreased recall of precautions       Problem Solving: Slow processing General Comments: pt unable to recall sternal precautions on arrival, after 3 periods of education could state end of session. With power transition pt needs cues for which line to connect to battery, assist to unlock cap on power source, cues for holster donning and battery placement in holster. Pt also initially trying to disconnect power line without battery in clip or ready for transition. Grossly 35sec to reconnect power on each line with transition      Exercises      General Comments        Pertinent Vitals/Pain Pain Assessment: No/denies pain    Home Living  Prior Function            PT Goals (current goals can now be found in the care plan section) Progress towards PT goals: Progressing toward goals    Frequency    Min 3X/week      PT Plan Current plan remains appropriate;Frequency needs to be updated    Co-evaluation              AM-PAC PT "6 Clicks" Daily Activity  Outcome Measure  Difficulty turning over in bed (including adjusting bedclothes, sheets and blankets)?: A Little Difficulty moving from lying on back to sitting on the side of the bed? : A Little Difficulty sitting down on and standing up from a chair with arms  (e.g., wheelchair, bedside commode, etc,.)?: A Little Help needed moving to and from a bed to chair (including a wheelchair)?: A Little Help needed walking in hospital room?: A Little Help needed climbing 3-5 steps with a railing? : A Little 6 Click Score: 18    End of Session   Activity Tolerance: Patient tolerated treatment well Patient left: in chair;with call bell/phone within reach Nurse Communication: Mobility status;Precautions PT Visit Diagnosis: Other abnormalities of gait and mobility (R26.89)     Time: 4098-1191 PT Time Calculation (min) (ACUTE ONLY): 32 min  Charges:  $Gait Training: 8-22 mins $Therapeutic Activity: 8-22 mins                    G Codes:       Benjamin Meigs, PT [phone]    Marshay Slates B Tanae Petrosky 05/29/2017, 8:48 AM

## 2017-05-29 NOTE — Progress Notes (Signed)
Occupational Therapy Progress Note  Pt is able to perform ADLs with min guard assist, he, however, requires min cues to manage VAD equipment.   Will continue to follow.  Recommend HHOT and 3in1 commode.    05/29/17 1100  OT Visit Information  Last OT Received On 05/29/17  Assistance Needed +1  History of Present Illness 47 yo admitted with cardiogenic shock, s/p RHC and IABP insertion with continued shock 4/29 , teeth extraction 4/26, QI347 4/30, extubated 5/1. PMHx: HF, cardiomyopathy, LV thrombus, hypothyroidism, DM, CKD, traumatic [facility] 11/2016  Precautions  Precautions Sternal  Precaution Comments LVAD - requires min cues to recall management of driveline and batteries.   Reviewed all strernal precaustions. Pt able to verablize  Pain Assessment  Pain Assessment No/denies pain  Cognition  Arousal/Alertness Awake/alert  Behavior During Therapy WFL for tasks assessed/performed  Overall Cognitive Status Impaired/Different from baseline  Area of Impairment Attention;Safety/judgement;Awareness;Problem solving  Current Attention Level Selective  Memory Decreased short-term memory;Decreased recall of precautions  Safety/Judgement Decreased awareness of safety  Awareness Emergent  Problem Solving Slow processing;Difficulty sequencing;Requires verbal cues  General Comments Pt requires cues for management of VAD equipment.  Requires increased time to intiate and complete tasks.  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  Bed Mobility  Overal bed mobility Needs Assistance  Bed Mobility Sit to  Supine  Sit to supine Supervision  Balance  Overall balance assessment Needs assistance  Sitting balance-Leahy Scale Good  Standing balance support No upper extremity supported  Standing balance-Leahy Scale Fair  Restrictions  Weight Bearing Restrictions Yes  Other Position/Activity Restrictions sternal  Transfers  Overall transfer level Needs assistance  Equipment used None  Transfers Sit to/from Stand  Sit to Stand Min guard  General Comments  General comments (skin integrity, edema, etc.) Pt reporting that he "gets cold and the shakes after walking."  OT - End of Session  Equipment Utilized During Treatment Rolling walker  Activity Tolerance Patient tolerated treatment well  Patient left in bed;with call bell/phone within reach  Nurse Communication Mobility status  OT Assessment/Plan  OT Plan Discharge plan remains appropriate  OT Visit Diagnosis Unsteadiness on feet (R26.81);Other symptoms and signs involving cognitive function  OT Frequency (ACUTE ONLY) Min 2X/week  Follow Up Recommendations No OT follow up;Supervision/Assistance - 24 hour  OT Equipment 3 in 1 bedside commode  AM-PAC OT "6 Clicks" Daily Activity Outcome Measure  Help from another person eating meals? 4  Help from another person taking care of personal grooming? 3  Help from another person toileting, which includes using toliet, bedpan, or urinal? 3  Help from another person bathing (including washing, rinsing, drying)? 3  Help from another person to put on and taking off regular upper body clothing? 3  Help from another person to put on and taking off regular lower body clothing? 3  6 Click Score 19  ADL G Code Conversion CK  OT Goal Progression  Progress towards OT goals Progressing toward goals  OT Time Calculation  OT Start Time (ACUTE ONLY) 1102  OT Stop Time (ACUTE ONLY) 1139  OT Time Calculation (min)  37 min  OT General Charges  $OT Visit 1 Visit  OT Treatments  $Self Care/Home Management   8-22 mins  $Therapeutic Activity 8-22 mins  [employer], OTR/L [phone]

## 2017-05-29 NOTE — Consult Note (Signed)
[facility]    Date of Admission:  04/21/2017     Total days of antibiotics 7               Reason for Consult: Fevers in LVAD patient     Referring Provider: Dr. Shirlee Latch  Primary Care Provider: Patient, No Pcp Per   Assessment: Benjamin Sherman is a 47 y.o. male now experiencing significant fevers after recent implantation of HM3 LVAD on 05/15/17. Experienced some leukocytosis < 25k and moderate fevers <101deg within the first week post-op; now resolved leukocytosis and ongoing fevers with rigors to 103 degrees. He has been on broad spectrum antibiotic therapy for a week now with recently added fluconazole and switch from zosyn to meropenem. Exam is in general unremarkable with exception for potential for odontogenic process to the left lower mandible. He has ongoing drainage from this site. Would check CT scan of the jaw/neck to evaluate for abscess. If present likely he would require surgery to clean out if this is the definitive source; would also favor Unasyn in this setting. Alternatively driveline site had more serous drainage than what I was expecting today after it was changed a short time ago. No surrounding cellulitis or erythema. He has also of note had a pretty substantial increase in his platelet counts over the last 10 days - reactive?   Plan: 1. Would check CT scan of neck  2. Would have Dr. Kristin Bruins reevaluate patient   Principal Problem:   Fever and chills Active Problems:   LV (left ventricular) mural thrombus without MI   Subarachnoid hemorrhage (HCC)   Chronic systolic CHF (congestive heart failure) (HCC)   CKD (chronic kidney disease), stage III (HCC)   Diabetes mellitus type 2, uncontrolled, with complications (HCC)   Fluid overload   Cardiogenic shock (HCC)   Acute on chronic systolic and diastolic heart failure, NYHA class 4 (HCC)   PAF (paroxysmal atrial fibrillation) (HCC)   Advance care planning   Goals of care,  counseling/discussion   Palliative care encounter   LVAD (left ventricular assist device) present (HCC)   . allopurinol  100 mg Oral Daily  . amiodarone  200 mg Oral Daily  . aspirin EC  81 mg Oral Daily  . bisacodyl  10 mg Oral Daily   Or  . bisacodyl  10 mg Rectal Daily  . chlorhexidine  15 mL Mouth/Throat BID  . clonazePAM  1 mg Oral QHS  . docusate sodium  200 mg Oral Daily  . feeding supplement (PIVOT 1.5 CAL)  237 mL Per Tube TID BM  . feeding supplement (PRO-STAT SUGAR FREE 64)  30 mL Oral BID  . ferrous fumarate-b12-vitamic C-folic acid  1 capsule Oral BID  . fluconazole  100 mg Oral Daily  . furosemide  40 mg Oral BID  . guaiFENesin  600 mg Oral BID  . insulin aspart  0-15 Units Subcutaneous TID WC  . insulin aspart  0-5 Units Subcutaneous QHS  . pantoprazole  40 mg Oral Daily  . potassium chloride  40 mEq Oral Daily  . potassium chloride  40 mEq Oral Once  . sodium chloride flush  10-40 mL Intracatheter Q12H  . warfarin  4 mg Oral ONCE-1800  . Warfarin - Pharmacist Dosing Inpatient   Does not apply q1800    HPI: Benjamin Sherman is a 47 y.o. male with past medical history detailed below but significant for Heartmate III LVAD placement 05/15/17  for end stage heart failure. Prior to LVAD implant he required IABP for 5 days, cardioversion and multiple teeth extractions on 05/11/17.   We were called to evaluate patient as he has been having significant fevers for the last nearly 5 days now with leukocytosis. He was started on vancomycin / zosyn on 5/7 and transitioned to meropenem yesterday 5/13 for sustained high fevers to 103deg. Fluconazole 100 mg was added 5/12. Blood culture and urine culture data is all unrevealing. CT Chest 05/23/17 with resolution of right pleural effusion and small left pleural effusion with small loculated component over left upper lobe. He is not on oxygen, reports no cough, SOB or dyspnea. No abdominal pain - had his driveline site changed this morning.  Reported serosanguinous drainage that freely drained from site. No pain/tenderness to abdominal wall. Teeth extractions prior to VAD - does not report any pain or swelling bot does have some drainage he notices from somewhere in his mouth where his teeth were pulled. Notes stringy material periodically as well.   In general his only complaint is "shaking chills" and "cannot get warm enough."   Review of Systems: Review of Systems  Constitutional: Positive for chills, diaphoresis and fever.  HENT: Negative for tinnitus.        Drainage from extraction site   Eyes: Negative for blurred vision and photophobia.  Respiratory: Negative for cough and sputum production.   Cardiovascular: Negative for chest pain.  Gastrointestinal: Negative for diarrhea, nausea and vomiting.  Genitourinary: Negative for dysuria.  Skin: Negative for rash.  Neurological: Negative for headaches.    Past Medical History:  Diagnosis Date  . CHF (congestive heart failure) (HCC)   . Diabetes mellitus without complication (HCC)     Social History   Tobacco Use  . Smoking status: Former Smoker    Types: Cigars    Last attempt to quit: 11/16/2016    Years since quitting: 0.5  . Smokeless tobacco: Never Used  . Tobacco comment: 6 cigars per wk  Substance Use Topics  . Alcohol use: Yes    Alcohol/week: 8.4 oz    Types: 12 Cans of beer, 2 Shots of liquor per week    Comment: occ  . Drug use: No    Family History  Problem Relation Age of Onset  . CAD Father    Allergies  Allergen Reactions  . Bee Venom     UNSPECIFIED REACTION     OBJECTIVE: Blood pressure (!) 87/73, pulse 99, temperature 99.1 F (37.3 C), temperature source Oral, resp. rate (!) 26, height 6' (1.829 m), weight 196 lb 9.6 oz (89.2 kg), SpO2 97 %.  Physical Exam  Constitutional: He is oriented to person, place, and time. He appears well-developed and well-nourished.  Seated comfortably in chair under blankets.   HENT:  Mouth/Throat:  Mucous membranes are normal.  Black hairy tongue appearance. No erythematous or purulent thrush present.  Left lower mandible there is an area that is draining and suspicious abscess.   Cardiovascular: Normal rate, regular rhythm and normal heart sounds.  Pulmonary/Chest: Effort normal and breath sounds normal.  Abdominal: Soft. He exhibits no distension. There is no tenderness.  Left Lower Quadrant Exit Site - suture remains. Thin serous/tan drainage noted that is easily expressed with palpation. Soaked to Aquacell strip after change this morning a short time ago.   Lymphadenopathy:    He has no cervical adenopathy.  Neurological: He is alert and oriented to person, place, and time.  Skin: Skin is  warm and dry. No rash noted.  Psychiatric: He has a normal mood and affect. Judgment normal.  In good spirits today and engaged in care discussion.   Vitals reviewed.  Lab Results Lab Results  Component Value Date   WBC 8.8 05/29/2017   HGB 10.1 (L) 05/29/2017   HCT 31.1 (L) 05/29/2017   MCV 84.3 05/29/2017   PLT 596 (H) 05/29/2017    Lab Results  Component Value Date   CREATININE 1.18 05/29/2017   BUN 15 05/29/2017   NA 132 (L) 05/29/2017   K 3.6 05/29/2017   CL 95 (L) 05/29/2017   CO2 27 05/29/2017    Lab Results  Component Value Date   ALT 13 (L) 05/29/2017   AST 26 05/29/2017   ALKPHOS 201 (H) 05/29/2017   BILITOT 1.3 (H) 05/29/2017     Microbiology: Blood cultures negative 5/07, 5/12, 5/14 Urine culture 5/09 > negative  Rexene Alberts, MSN, NP-C [facility] Cell: [phone] Pager: [phone]  05/29/2017 11:24 AM

## 2017-05-29 NOTE — Progress Notes (Addendum)
ANTICOAGULATION CONSULT NOTE - Follow-Up Consult  Pharmacy Consult for Warfarin + Heparin Indication: LVAD  Allergies  Allergen Reactions  . Bee Venom     UNSPECIFIED REACTION     Patient Measurements: Height: 6' (182.9 cm) Weight: 196 lb 9.6 oz (89.2 kg) IBW/kg (Calculated) : 77.6 Heparin Dosing Weight: n/a  Vital Signs: Temp: 99.1 F (37.3 C) (05/14 0801) Temp Source: Oral (05/14 0801) BP: 87/73 (05/14 0801) Pulse Rate: 99 (05/14 0842)  Labs: Recent Labs    05/27/17 0436 05/28/17 0610 05/29/17 0842  HGB 9.0* 9.2* 10.1*  HCT 27.0* 28.3* 31.1*  PLT 517* 510* 596*  LABPROT 25.4* 22.0* 19.1*  INR 2.33 1.94 1.62  CREATININE 1.13 1.28*  --     Estimated Creatinine Clearance: 77.5 mL/min (A) (by C-G formula based on SCr of 1.28 mg/dL (H)).   Medical History: Past Medical History:  Diagnosis Date  . CHF (congestive heart failure) (HCC)   . Diabetes mellitus without complication [facility])     Assessment: 47 yo male s/p LVAD placement 4/30 on warfarin per pharmacy. INR has been erratic this admit, appears patient will likely need close to 3-4mg  daily. Hgb stable, no LDH today with pt refusing labs, INR now subtherapeutic at 1.62 - pharmacy consulted to begin heparin bridge while INR <1.8. Heparin previously therapeutic at 1550 units/hr - will empirically reduce dose slightly given concomitant warfarin.   Goal of Therapy:  INR goal 2-2.5  Heparin Level 0.3-0.5 Monitor platelets by anticoagulation protocol: Yes   Plan:  -Warfarin 4mg  PO x1 tonight -Start heparin 1350 units/hr -Check 6-hr heparin level -Daily INR, heparin level, CBC  Fredonia Highland, PharmD, BCPS PGY-2 Cardiology Pharmacy Resident Pager: [phone] 05/29/2017

## 2017-05-29 NOTE — Progress Notes (Signed)
Pharmacy Antibiotic Note  Francesca Vos is a 47 y.o. male admitted on 04/21/2017 now s/p LVAD 4/30 on antimicrobials for elevated WBC and continued fevers.  Pharmacy has been consulted for vancomycin dosing with meropenem and fluconazole ordered per MD. All cultures negative thus far, WBC has now normalized.   Plan: -Continue vancomycin 750mg  IV q12h -Meropenem 1g IV q8h per MD -Fluconazole 100mg  PO daily per MD -Repeat vancomycin trough before the weekend if continued  Height: 6' (182.9 cm) Weight: 196 lb 9.6 oz (89.2 kg) IBW/kg (Calculated) : 77.6  Temp (24hrs), Avg:101 F (38.3 C), Min:99.1 F (37.3 C), Max:103 F (39.4 C)  Recent Labs  Lab 05/25/17 0413 05/26/17 0425 05/27/17 0436 05/28/17 0610 05/29/17 0842  WBC 14.9* 14.3* 12.8* 11.7* 8.8  CREATININE 1.33* 1.15 1.13 1.28* 1.18  VANCOTROUGH 17  --   --   --   --     Estimated Creatinine Clearance: 84 mL/min (by C-G formula based on SCr of 1.18 mg/dL).    Allergies  Allergen Reactions  . Bee Venom     UNSPECIFIED REACTION     Antimicrobials this admission: Fluconazole 5/12 > (5/15) Merrem 5/13 >> Zosyn 5/9 >>5/13 Vanco 4/30> 5/5; 5/8 >>  Dose adjustments this admission: 5/3 VT = 26 on 1g q12h > reduce 500 q12h 5/10 VT 17 > continue 750 q12  Microbiology results: 5/13 BCx: sent 5/9 UCx: negative 5/7 BCx: negative 4/29 MRSA: negative  Thank you for allowing pharmacy to be a part of this patient's care.  Fredonia Highland, PharmD, BCPS PGY-2 Cardiology Pharmacy Resident Pager: [phone] 05/29/2017

## 2017-05-29 NOTE — Progress Notes (Signed)
CSW attempted to meet with patient at bedside although patient sound asleep. VAD coordinator report patient's caregiver had training yesterday. CSW will continue to follow for supportive needs and post discharge through the VAD clinic. Lasandra Beech, LCSW, CCSW-MCS [phone]

## 2017-05-29 NOTE — Progress Notes (Signed)
1320 Came to see pt to walk. Pt and staff state he has walked twice already. Set up lunch for pt. He wants to rest now. Will continue to follow. Luetta Nutting RN BSN 05/29/2017 1:22 PM

## 2017-05-29 NOTE — Progress Notes (Signed)
ANTICOAGULATION CONSULT NOTE  Pharmacy Consult for Heparin Indication: LVAD  Allergies  Allergen Reactions  . Bee Venom     UNSPECIFIED REACTION     Patient Measurements: Height: 6' (182.9 cm) Weight: 196 lb 9.6 oz (89.2 kg) IBW/kg (Calculated) : 77.6 Heparin Dosing Weight: 77.6kg  Vital Signs: Temp: 99.1 F (37.3 C) (05/14 1946) Temp Source: Oral (05/14 1946) BP: 117/88 (05/14 1946) Pulse Rate: 94 (05/14 1946)  Labs: Recent Labs    05/27/17 0436 05/28/17 0610 05/29/17 0842 05/29/17 1536 05/29/17 2258  HGB 9.0* 9.2* 10.1*  --   --   HCT 27.0* 28.3* 31.1*  --   --   PLT 517* 510* 596*  --   --   LABPROT 25.4* 22.0* 19.1*  --   --   INR 2.33 1.94 1.62  --   --   HEPARINUNFRC  --   --   --  0.22* 0.43  CREATININE 1.13 1.28* 1.18  --   --     Estimated Creatinine Clearance: 84 mL/min (by C-G formula based on SCr of 1.18 mg/dL).  Assessment: 47 yo male s/p LVAD placement, INR subtherapeutic, for heparin  Goal of Therapy:  INR goal 2-2.5  Heparin Level 0.3-0.5units/mL Monitor platelets by anticoagulation protocol: Yes   Plan:  Continue Heparin at current rate  Geannie Risen, PharmD, BCPS  05/29/2017 11:40 PM

## 2017-05-29 NOTE — Progress Notes (Addendum)
Patient ID: Benjamin Sherman, male   DOB: 10/05/1970, 47 y.o.   MRN: [id number]    Advanced Heart Failure Rounding Note  Subjective:    Events: -Admitted 04/21/17 with cardiogenic shock and atrial flutter.  -Central line placed. Initial co-ox 39%. -IABP placed 04/25/17-> 04/30/17 with persistent cardiogenic shock.  -DCCV atrial flutter 4/12.   -S/p multiple teeth extractions 05/11/17.  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  -Extubated on 5/1  - driveline trauma- from repositioning in bed. 5/9 - 5/10 Received 1uRBCs on 5/10. - 5/10 Ramp echo with speed turned down to 5300.  CT Chest 05/23/17 resolution of R pleural effusion. Small left pleural effusion with suggestion of small loculated component over the lateral left upper lobe. Bibasilar linear atelectasis.   Yesterday amiodarone was cut back to 200 mg daily. Meropneum added for fever.   Refused morning labs. Denies SOB/Orthopnea.   LVAD Interrogation HM 3: Speed: 5300 Flow: 4.1  PI: 4.1  Power: 4. No PI events. Personally reviewed   Objective:   Weight Range:  Vital Signs:   Temp:  [99.1 F (37.3 C)-103 F (39.4 C)] 99.1 F (37.3 C) (05/14 0801) Pulse Rate:  [92-110] 93 (05/14 0436) Resp:  [20-34] 26 (05/14 0801) BP: (77-113)/(58-86) 87/73 (05/14 0801) SpO2:  [97 %-100 %] 97 % (05/14 0436) Weight:  [196 lb 9.6 oz (89.2 kg)] 196 lb 9.6 oz (89.2 kg) (05/14 0446) Last BM Date: 05/28/17  Weight change: Filed Weights   05/27/17 0434 05/28/17 0600 05/29/17 0446  Weight: 196 lb 3.2 oz (89 kg) 195 lb 9.6 oz (88.7 kg) 196 lb 9.6 oz (89.2 kg)   Intake/Output:   Intake/Output Summary (Last 24 hours) at 05/29/2017 0812 Last data filed at 05/29/2017 0801 Gross per 24 hour  Intake 960 ml  Output 1225 ml  Net -265 ml    Physical Exam    Physical Exam: GENERAL: No acute distress. Sitting on the side of the bed.  HEENT: normal  NECK: Supple, JVP 5-6 .  2+ bilaterally, no bruits.  No lymphadenopathy or thyromegaly appreciated.   CARDIAC:  Mechanical  heart sounds with LVAD hum present.  LUNGS:  Clear to auscultation bilaterally.  ABDOMEN:  Soft, round, nontender, positive bowel sounds x4.     LVAD exit site:  Dressing dry and intact.  No erythema or drainage.  Stabilization device present and accurately applied.  Driveline dressing is being changed daily per sterile technique. EXTREMITIES:  Warm and dry, no cyanosis, clubbing, rash or edema  NEUROLOGIC:  Alert and oriented x 4.  Gait steady.  No aphasia.  No dysarthria.  Affect pleasant.       Telemetry    NSR 80s personally reviewed.   EKG   No new tracings.    Labs    Basic Metabolic Panel: Recent Labs  Lab 05/24/17 0416 05/25/17 0413 05/26/17 0425 05/27/17 0436 05/28/17 0610  NA 131* 132* 132* 132* 132*  K 3.8 3.7 3.5 3.6 3.8  CL 94* 95* 96* 96* 96*  CO2 27 26 26 26 26   GLUCOSE 133* 128* 122* 135* 113*  BUN 26* 25* 20 16 17   CREATININE 1.20 1.33* 1.15 1.13 1.28*  CALCIUM 8.8* 8.5* 8.8* 8.7* 8.8*  MG 1.9  --   --   --   --    Liver Function Tests: Recent Labs  Lab 05/24/17 0416 05/25/17 0413 05/26/17 0425 05/27/17 0436 05/28/17 0610  AST 21 23 21 23 25   ALT 13* 12* 10* 12* 13*  ALKPHOS 131* 152* 177*  189* 187*  BILITOT 1.6* 1.7* 1.8* 1.2 1.6*  PROT 7.1 7.4 7.6 7.7 8.2*  ALBUMIN 2.1* 2.0* 2.1* 2.0* 2.3*   No results for input(s): LIPASE, AMYLASE in the last 168 hours. No results for input(s): AMMONIA in the last 168 hours.  CBC: Recent Labs  Lab 05/24/17 0416 05/25/17 0413 05/26/17 0425 05/27/17 0436 05/28/17 0610  WBC 17.0* 14.9* 14.3* 12.8* 11.7*  HGB 8.7* 7.8* 9.0* 9.0* 9.2*  HCT 26.2* 24.0* 27.2* 27.0* 28.3*  MCV 84.5 83.9 84.2 83.6 84.0  PLT 380 400 476* 517* 510*   Cardiac Enzymes: No results for input(s): CKTOTAL, CKMB, CKMBINDEX, TROPONINI in the last 168 hours. BNP: BNP (last 3 results) Recent Labs    04/21/17 1212 05/16/17 0529 05/22/17 0403  BNP 483.0* 262.3* 221.3*   ProBNP (last 3 results) No results for input(s):  PROBNP in the last 8760 hours.  Other results:  Imaging: Ct Head Wo Contrast  Result Date: 05/28/2017 CLINICAL DATA:  Fever of unknown origin. EXAM: CT HEAD WITHOUT CONTRAST TECHNIQUE: Contiguous axial images were obtained from the base of the skull through the vertex without intravenous contrast. COMPARISON:  CT scan of December 08, 2016. FINDINGS: Brain: Left frontal encephalomalacia is noted consistent with old infarction. No mass effect or midline shift is noted. Ventricular size is within normal limits. There is no evidence of mass lesion, hemorrhage or acute infarction. Vascular: No hyperdense vessel or unexpected calcification. Skull: Normal. Negative for fracture or focal lesion. Sinuses/Orbits: No acute finding. Other: None. 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 No acute intracranial abnormality seen. Electronically Signed   By: Lupita Raider, M.D.   On: 05/28/2017 21:18    Medications:    Scheduled Medications: . allopurinol  100 mg Oral Daily  . amiodarone  200 mg Oral Daily  . aspirin EC  81 mg Oral Daily  . bisacodyl  10 mg Oral Daily   Or  . bisacodyl  10 mg Rectal Daily  . chlorhexidine  15 mL Mouth/Throat BID  . clonazePAM  1 mg Oral QHS  . docusate sodium  200 mg Oral Daily  . feeding supplement (PIVOT 1.5 CAL)  237 mL Per Tube TID BM  . feeding supplement (PRO-STAT SUGAR FREE 64)  30 mL Oral BID  . ferrous fumarate-b12-vitamic C-folic acid  1 capsule Oral BID  . fluconazole  100 mg Oral Daily  . furosemide  40 mg Oral BID  . guaiFENesin  600 mg Oral BID  . insulin aspart  0-15 Units Subcutaneous TID WC  . insulin aspart  0-5 Units Subcutaneous QHS  . pantoprazole  40 mg Oral Daily  . potassium chloride  40 mEq Oral Daily  . sodium chloride flush  10-40 mL Intracatheter Q12H  . Warfarin - Pharmacist Dosing Inpatient   Does not apply q1800    Infusions: . sodium chloride    . sodium chloride    . lactated ringers Stopped (05/22/17 0926)  . meropenem  (MERREM) IV Stopped (05/29/17 1610)  . vancomycin Stopped (05/29/17 0554)    PRN Medications:    Assessment:   Benjamin Sherman is a 47 y.o. 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 eosinophilic cardiomyopathy, LV thrombus, hypothyroidism, DM2, CKD Stage II-III and traumatic [facility] 11/18 after fall.   Admitted with cardiogenic shock.  Plan/Discussion:    1. 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  - Continue ASA 81 daily.  - Warfarin for goal INR 2-2.5  -INR  1.6. Start heparin drip. Continue coumadin.   - Discussed with pharmacy.  - Driveline was pulled while being repositioned in bed on 05/24/2017. Per VAD coordinator, velour is not exposed. Increased exudate noted. VAD coordinators following On zosyn.  - Speed adjusted to 5300 with ramp echo 5/10  2. Acute on chronic systolic HF -> cardiogenic shock:  Nonischemic cardiomyopathy. ECHO 11/2016 EF 25-30% CMRI EF 22%. Bedside echo in HF clinic EF 20% in 3/19.  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 Did not respond to steroids. Admitted 4/6 with cardiogenic shock and volume overload co-ox 38%. Echo 04/23/17: LVEF 20-25% with large LV clot, RV mild to moderately down.   -PICC out 5/12.  - Volume status stable.  - Continue lasix 40 mg po BID. 3. Atrial flutter: New onset. S/p DCCV to NSR on 4/12.  - Maintaining NSR.  -Continue amio 200 mg daily   4. AKI CKD Stage II-III: Likely ATN due to shock.   -5. H/o LV Thrombus -   - resected with VAD placement 6. DMII - Per TCTS.  - No change to current plan.   7. Hypothyroidism:  - TFTs OK 04/22/17. TSH elevated at 10.848 4/23. T4 and T3 normal.  - No change to current plan.   8. RLE erythema/cellulitis:  9. ID - Covering with vanc, meropenum, and fluconazole. Tmax 101.8  - CT Chest 05/23/17 resolution of R pleural effusion. Small left pleural effusion with suggestion of small loculated component over the lateral left upper lobe. Bibasilar linear  atelectasis.  - Blood CX 5/13 ---> cultures pending.    - Per Dr. Donata Clay will give course of diflucan. Day 3/4 10. Anemia-post-op blood loss - improved s/p 1u RBC 5/10 -Hgb 10.1.    He refused lab work this morning.  Discussed. Stat labs.  INR 1.6--> start heparin.  WBC 8.8  LDH 161>096>045  Consult ID per Dr Shirlee Latch for ongoing feves.   Tonye Becket, NP  05/29/2017 8:12 AM   Advanced Heart Failure Team Pager [phone] (M-F; 7a - 4p)  Please contact [facility] for night-coverage after hours (4p -7a ) and weekends on [email or web address]  Patient seen with NP, agree with the above note.  Patient still having fevers.  MAP in 80s.  Doing well walking, not short of breath.  INR 1.6 today.  LVAD parameters stable.   On exam, normal LVAD sounds.  No JVD.  No edema.   From LVAD standpoint, he is doing well.  However, he is having persistent fevers.  We had ID see him today.  Concern for possible dental infection.  Plan for CT mouth and and dental consult.  He continues on vancomycin, meropenem, Diflucan.  Driveline site ok.    INR 1.6, starting heparin gtt today until INR 1.8.    Volume status stable, continue Lasix 40 mg po bid.   Marca Ancona 05/29/2017

## 2017-05-29 NOTE — Progress Notes (Signed)
Nutrition Follow-up  DOCUMENTATION CODES:   Not applicable  INTERVENTION:   -Continue 30 ml Prostat BID, each supplement provides 100 kcals and 30 grams protein  NUTRITION DIAGNOSIS:   Increased nutrient needs related to catabolic illness(End stage HF) as evidenced by estimated needs.  Onging  GOAL:   Patient will meet greater than or equal to 90% of their needs  Met with PO diet and Prostat supplements  MONITOR:   PO intake, Supplement acceptance, Labs, Weight trends, I & O's  REASON FOR ASSESSMENT:   Consult Assessment of nutrition requirement/status  ASSESSMENT:   47 y/o make PMHx CHF, DM2, SAH, CKD2-3. Patient had been diagnosed with HF (ef 15%) end of last year. [facility]ed to [facility] 4[facility]6 w/ SOB and BLE edema x 2.5 days.  Worked up for Cardiogenic shock in setting of decompensated, worsening heart failure. He is not a transplant candidate and poor candidate for LVAD due to social situation & RV dysfunction. VAD team evaluating. Options are hospice vs high risk VAD. RD consulted for nutrition evaluation for potential LVAD.   Reviewed I/O's: +160 ml x 24 hours and -3.9 L since 05/15/17.   Wt has been stable this week.   Pt sleeping soundly in bed, wrapping in blankets at time of visit. RD did not disturb.   Case discussed with RN, who reports pt has a great appetite. Meal completion 80-100%. Pt is tolerating diet texture without difficulty and is accepting Prostat supplements.   ID consulted for ongoing fevers; considering re-imaging of jaw.   Labs reviewed: Na: 132, CBGS: 121-134 (inpatient orders for glycemic control are 0-15 units insulin aspart TID with meals and 0-5 units insulin aspart q HS).  Diet Order:   Diet Order           DIET SOFT Room service appropriate? Yes; Fluid consistency: Thin  Diet effective now          EDUCATION NEEDS:   Education needs have been addressed  Skin:  Skin Assessment: Skin Integrity Issues: Skin Integrity Issues::  Incisions Incisions: surgical: abdomen, chest  Last BM:  05/28/17  Height:   Ht Readings from Last 1 Encounters:  05/22/17 6' (1.829 m)    Weight:   Wt Readings from Last 1 Encounters:  05/29/17 196 lb 9.6 oz (89.2 kg)    Ideal Body Weight:  80.91 kg  BMI:  Body mass index is 26.66 kg/m.  Estimated Nutritional Needs:   Kcal:  2200-2400  Protein:  115-130 grams  Fluid:  2.2-2.4 L    Janaiah Vetrano A. Jimmye Norman, RD, LDN, CDE Pager: [phone] After hours Pager: [phone]

## 2017-05-29 NOTE — Progress Notes (Signed)
ANTICOAGULATION CONSULT NOTE - Follow-Up Consult  Pharmacy Consult for Heparin Indication: LVAD  Allergies  Allergen Reactions  . Bee Venom     UNSPECIFIED REACTION     Patient Measurements: Height: 6' (182.9 cm) Weight: 196 lb 9.6 oz (89.2 kg) IBW/kg (Calculated) : 77.6 Heparin Dosing Weight: 77.6kg  Vital Signs: Temp: 100 F (37.8 C) (05/14 1600) Temp Source: Oral (05/14 1600) BP: 96/83 (05/14 1208) Pulse Rate: 93 (05/14 1600)  Labs: Recent Labs    05/27/17 0436 05/28/17 0610 05/29/17 0842 05/29/17 1536  HGB 9.0* 9.2* 10.1*  --   HCT 27.0* 28.3* 31.1*  --   PLT 517* 510* 596*  --   LABPROT 25.4* 22.0* 19.1*  --   INR 2.33 1.94 1.62  --   HEPARINUNFRC  --   --   --  0.22*  CREATININE 1.13 1.28* 1.18  --     Estimated Creatinine Clearance: 84 mL/min (by C-G formula based on SCr of 1.18 mg/dL).  Assessment: 47 yo male s/p LVAD placement 4/30 on warfarin per pharmacy. INR has been erratic this admit, appears patient will likely need close to 3-4mg  daily.   INR has fallen below goal and patient was started on heparin earlier today with plans to continue until INR >1.8. Initial heparin level just below goal range at 0.22units/mL.  No bleeding noted.  Goal of Therapy:  INR goal 2-2.5  Heparin Level 0.3-0.5units/mL Monitor platelets by anticoagulation protocol: Yes   Plan:  Increase heparin to 1450 units/hr Check 6-hr heparin level Daily INR, heparin level, CBC  Eduar Kumpf D. Jerita Wimbush, PharmD, BCPS Clinical Pharmacist [phone] 05/29/2017 4:33 PM

## 2017-05-30 LAB — GLUCOSE, CAPILLARY
Glucose-Capillary: 118 mg/dL — ABNORMAL HIGH (ref 65–99)
Glucose-Capillary: 115 mg/dL — ABNORMAL HIGH (ref 65–99)
Glucose-Capillary: 151 mg/dL — ABNORMAL HIGH (ref 65–99)

## 2017-05-30 LAB — COMPREHENSIVE METABOLIC PANEL
ALT: 13 U/L — ABNORMAL LOW (ref 17–63)
AST: 25 U/L (ref 15–41)
Albumin: 2.1 g/dL — ABNORMAL LOW (ref 3.5–5.0)
Alkaline Phosphatase: 175 U/L — ABNORMAL HIGH (ref 38–126)
Anion gap: 11 (ref 5–15)
BUN: 15 mg/dL (ref 6–20)
CO2: 23 mmol/L (ref 22–32)
Calcium: 8.4 mg/dL — ABNORMAL LOW (ref 8.9–10.3)
Chloride: 95 mmol/L — ABNORMAL LOW (ref 101–111)
Creatinine, Ser: 1.34 mg/dL — ABNORMAL HIGH (ref 0.61–1.24)
GFR calc Af Amer: >60 mL/min (ref >60)
GFR calc non Af Amer: >60 mL/min (ref >60)
Glucose, Bld: 127 mg/dL — ABNORMAL HIGH (ref 65–99)
Potassium: 3.9 mmol/L (ref 3.5–5.1)
Sodium: 129 mmol/L — ABNORMAL LOW (ref 135–145)
Total Bilirubin: 1 mg/dL (ref 0.3–1.2)
Total Protein: 7.8 g/dL (ref 6.5–8.1)

## 2017-05-30 LAB — CBC
HCT: 28.9 % — ABNORMAL LOW (ref 39.0–52.0)
Hemoglobin: 9.5 g/dL — ABNORMAL LOW (ref 13.0–17.0)
MCH: 27.7 pg (ref 26.0–34.0)
MCHC: 32.9 g/dL (ref 30.0–36.0)
MCV: 84.3 fL (ref 78.0–100.0)
Platelets: 523 10*3/uL — ABNORMAL HIGH (ref 150–400)
RBC: 3.43 MIL/uL — ABNORMAL LOW (ref 4.22–5.81)
RDW: 15.9 % — ABNORMAL HIGH (ref 11.5–15.5)
WBC: 9.5 10*3/uL (ref 4.0–10.5)

## 2017-05-30 LAB — PROTIME-INR
INR: 1.39
Prothrombin Time: 17 seconds — ABNORMAL HIGH (ref 11.4–15.2)

## 2017-05-30 LAB — LACTATE DEHYDROGENASE: LDH: 317 U/L — ABNORMAL HIGH (ref 98–192)

## 2017-05-30 LAB — HEPARIN LEVEL (UNFRACTIONATED): Heparin Unfractionated: 0.42 IU/mL (ref 0.30–0.70)

## 2017-05-30 MED ORDER — FUROSEMIDE 40 MG PO TABS
40.0000 mg | ORAL_TABLET | Freq: Every day | ORAL | Status: DC
  Administered 2017-05-30 – 2017-06-04 (×6): 40 mg via ORAL
  Filled 2017-05-30 (×5): qty 1
  Filled 2017-05-30: qty 2

## 2017-05-30 MED ORDER — WARFARIN SODIUM 5 MG PO TABS
5.0000 mg | ORAL_TABLET | Freq: Once | ORAL | Status: AC
  Administered 2017-05-30: 5 mg via ORAL
  Filled 2017-05-30: qty 1

## 2017-05-30 MED ORDER — LOSARTAN POTASSIUM 25 MG PO TABS
25.0000 mg | ORAL_TABLET | Freq: Every day | ORAL | Status: DC
  Administered 2017-05-30 – 2017-06-04 (×6): 25 mg via ORAL
  Filled 2017-05-30 (×6): qty 1

## 2017-05-30 NOTE — Progress Notes (Signed)
Patient ID: Benjamin Sherman, male   DOB: 12/21/1970, 47 y.o.   MRN: [id number]    Advanced Heart Failure Rounding Note  Subjective:    Events: -Admitted 04/21/17 with cardiogenic shock and atrial flutter.  -Central line placed. Initial co-ox 39%. -IABP placed 04/25/17-> 04/30/17 with persistent cardiogenic shock.  -DCCV atrial flutter 4/12.   -S/p multiple teeth extractions 05/11/17.  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  -Extubated on 5/1  - driveline trauma- from repositioning in bed. 5/9 - 5/10 Received 1uRBCs on 5/10. - 5/10 Ramp echo with speed turned down to 5300.  CT Chest 05/23/17 resolution of R pleural effusion. Small left pleural effusion with suggestion of small loculated component over the lateral left upper lobe. Bibasilar linear atelectasis.   Remains of meropenem and fluconazole. Tmax 101.8 at 1140p. Cultures remains negative. Says he is going stir crazy. MAP up last night to 100. Received hydralazine. Back down this am to 90.  ID saw yesterday recommended CT jaw -> no evidence active infection by CT  LVAD Interrogation HM 3: Speed: 5300 Flow: 4.3  PI: 4.0  Power: 4.2. No PI events. PICC Removal Note:    Objective:   Weight Range:  Vital Signs:   Temp:  [99.1 F (37.3 C)-101.8 F (38.8 C)] 99.1 F (37.3 C) (05/15 0316) Pulse Rate:  [90-105] 105 (05/15 0316) Resp:  [21-34] 25 (05/15 0316) BP: (87-117)/(73-90) 102/83 (05/15 0316) SpO2:  [97 %-100 %] 99 % (05/15 0316) Weight:  [88.9 kg (195 lb 15.8 oz)] 88.9 kg (195 lb 15.8 oz) (05/15 0536) Last BM Date: 05/28/17  Weight change: Filed Weights   05/28/17 0600 05/29/17 0446 05/30/17 0536  Weight: 88.7 kg (195 lb 9.6 oz) 89.2 kg (196 lb 9.6 oz) 88.9 kg (195 lb 15.8 oz)   Intake/Output:   Intake/Output Summary (Last 24 hours) at 05/30/2017 0615 Last data filed at 05/30/2017 0610 Gross per 24 hour  Intake 886.42 ml  Output 2575 ml  Net -1688.58 ml    Physical Exam   General:  NAD.  HEENT: normal edentulous Neck: supple. JVP not  elevated.  Carotids 2+ bilat; no bruits. No lymphadenopathy or thryomegaly appreciated. Cor: LVAD hum.  Lungs: Clear. Abdomen: soft, nontender, non-distended. No hepatosplenomegaly. No bruits or masses. Good bowel sounds. Driveline site clean. Anchor in place.  Extremities: no cyanosis, clubbing, rash. Warm no edema  Neuro: alert & oriented x 3. No focal deficits. Moves all 4 without problem    Telemetry    NSR 80-90s Personally reviewed   EKG   No new tracings.    Labs    Basic Metabolic Panel: Recent Labs  Lab 05/24/17 0416  05/26/17 0425 05/27/17 0436 05/28/17 0610 05/29/17 0842 05/30/17 0246  NA 131*   < > 132* 132* 132* 132* 129*  K 3.8   < > 3.5 3.6 3.8 3.6 3.9  CL 94*   < > 96* 96* 96* 95* 95*  CO2 27   < > 26 26 26 27 23   GLUCOSE 133*   < > 122* 135* 113* 113* 127*  BUN 26*   < > 20 16 17 15 15   CREATININE 1.20   < > 1.15 1.13 1.28* 1.18 1.34*  CALCIUM 8.8*   < > 8.8* 8.7* 8.8* 8.9 8.4*  MG 1.9  --   --   --   --   --   --    < > = values in this interval not displayed.   Liver Function Tests: Recent Labs  Lab  05/26/17 0425 05/27/17 0436 05/28/17 0610 05/29/17 0842 05/30/17 0246  AST 21 23 25 26 25   ALT 10* 12* 13* 13* 13*  ALKPHOS 177* 189* 187* 201* 175*  BILITOT 1.8* 1.2 1.6* 1.3* 1.0  PROT 7.6 7.7 8.2* 8.4* 7.8  ALBUMIN 2.1* 2.0* 2.3* 2.3* 2.1*   No results for input(s): LIPASE, AMYLASE in the last 168 hours. No results for input(s): AMMONIA in the last 168 hours.  CBC: Recent Labs  Lab 05/26/17 0425 05/27/17 0436 05/28/17 0610 05/29/17 0842 05/30/17 0246  WBC 14.3* 12.8* 11.7* 8.8 9.5  HGB 9.0* 9.0* 9.2* 10.1* 9.5*  HCT 27.2* 27.0* 28.3* 31.1* 28.9*  MCV 84.2 83.6 84.0 84.3 84.3  PLT 476* 517* 510* 596* 523*   Cardiac Enzymes: No results for input(s): CKTOTAL, CKMB, CKMBINDEX, TROPONINI in the last 168 hours. BNP: BNP (last 3 results) Recent Labs    04/21/17 1212 05/16/17 0529 05/22/17 0403  BNP 483.0* 262.3* 221.3*    ProBNP (last 3 results) No results for input(s): PROBNP in the last 8760 hours.  Other results:  Imaging: Ct Head Wo Contrast  Result Date: 05/28/2017 CLINICAL DATA:  Fever of unknown origin. EXAM: CT HEAD WITHOUT CONTRAST TECHNIQUE: Contiguous axial images were obtained from the base of the skull through the vertex without intravenous contrast. COMPARISON:  CT scan of December 08, 2016. FINDINGS: Brain: Left frontal encephalomalacia is noted consistent with old infarction. No mass effect or midline shift is noted. Ventricular size is within normal limits. There is no evidence of mass lesion, hemorrhage or acute infarction. Vascular: No hyperdense vessel or unexpected calcification. Skull: Normal. Negative for fracture or focal lesion. Sinuses/Orbits: No acute finding. Other: None. 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 No acute intracranial abnormality seen. Electronically Signed   By: Lupita Raider, M.D.   On: 05/28/2017 21:18   Ct Soft Tissue Neck W Contrast  Result Date: 05/29/2017 CLINICAL DATA:  Initial evaluation for dental caries, status post recent dental extraction, concern for abscess. EXAM: CT NECK WITH CONTRAST TECHNIQUE: Multidetector CT imaging of the neck was performed using the standard protocol following the bolus administration of intravenous contrast. CONTRAST:  OMNIPAQUE IOHEXOL 300 MG/ML  SOLN COMPARISON:  None. FINDINGS: Pharynx and larynx: Oral cavity within normal limits without mass lesion or loculated fluid collection. Sequelae of multiple recent dental extractions. Patient now edentulous. Single 9 mm hypodensity along the lingual aspect of the right maxilla (series 3, image 33), suspicious for a possible small odontogenic abscess (series 8, image 34). No other significant inflammatory changes about the maxilla or mandible. No other discrete collections identified. Palatine tonsils symmetric and within normal limits. Parapharyngeal fat preserved. Nasopharynx  normal. No retropharyngeal swelling or collection. Epiglottis normal. Vallecula clear. Remainder of the hypopharynx and supraglottic larynx within normal limits. True cords apposed and not well evaluated. Subglottic airway clear. Salivary glands: Salivary glands including the parotid and submandibular glands are within normal limits. Thyroid: Unremarkable. Lymph nodes: No adenopathy within the neck. Vascular: Normal intravascular enhancement seen throughout the neck. Limited intracranial: Unremarkable. Visualized orbits: Visualized globes and orbital soft tissues within normal limits. Mastoids and visualized paranasal sinuses: Scattered mucosal thickening within the bilateral maxillary sinuses and ethmoidal air cells. Superimposed left maxillary sinus retention cyst noted. Visualized paranasal sinuses otherwise clear. Trace opacity right mastoid air cells noted. Middle ear cavities are clear. Skeleton: No acute osseous abnormality. No worrisome lytic or blastic osseous lesions. Moderate cervical spondylolysis at C4-5 through C7-T1. Median sternotomy wires noted. Upper chest: Shotty  subcentimeter lymph nodes noted within the partially visualized upper mediastinum. Moderate layering left pleural effusion partially visualized. Other: None. IMPRESSION: 1. Sequelae of recent dental extraction with 9 mm hypodensity positioned along the lingual aspect of the right maxilla, which could reflect a small/early developing abscess. Correlation with physical exam recommended. 2. No other odontogenic abscess or significant inflammatory changes identified about the maxilla or mandible. 3. Moderate left pleural effusion. Electronically Signed   By: Rise Mu M.D.   On: 05/29/2017 18:44    Medications:    Scheduled Medications: . allopurinol  100 mg Oral Daily  . amiodarone  200 mg Oral Daily  . aspirin EC  81 mg Oral Daily  . bisacodyl  10 mg Oral Daily   Or  . bisacodyl  10 mg Rectal Daily  . chlorhexidine   15 mL Mouth/Throat BID  . clonazePAM  1 mg Oral QHS  . docusate sodium  200 mg Oral Daily  . feeding supplement (PRO-STAT SUGAR FREE 64)  30 mL Oral BID  . ferrous fumarate-b12-vitamic C-folic acid  1 capsule Oral BID  . fluconazole  100 mg Oral Daily  . furosemide  40 mg Oral BID  . guaiFENesin  600 mg Oral BID  . insulin aspart  0-15 Units Subcutaneous TID WC  . insulin aspart  0-5 Units Subcutaneous QHS  . pantoprazole  40 mg Oral Daily  . potassium chloride  40 mEq Oral Daily  . sodium chloride flush  10-40 mL Intracatheter Q12H  . Warfarin - Pharmacist Dosing Inpatient   Does not apply q1800    Infusions: . sodium chloride    . sodium chloride    . heparin 1,450 Units/hr (05/29/17 1900)  . lactated ringers Stopped (05/22/17 0926)  . meropenem (MERREM) IV 1 g (05/30/17 1610)  . vancomycin Stopped (05/30/17 0526)    PRN Medications:    Assessment:   Trevian Hayashida is a 47 y.o. 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 eosinophilic cardiomyopathy, LV thrombus, hypothyroidism, DM2, CKD Stage II-III and traumatic [facility] 11/18 after fall.   Admitted with cardiogenic shock.  Plan/Discussion:    1. 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  - Continue ASA 81 daily.  - Warfarin for goal INR 2-2.5  -INR dropping again - now 1.4 On heparin drip. Continue coumadin.   - Discussed dosing with PharmD personally. Will need to bump dose  - Driveline was pulled while being repositioned in bed on 05/24/2017. Per VAD coordinator, velour is not exposed. Increased exudate noted. VAD coordinators following On zosyn.  - Speed adjusted to 5300 with ramp echo 5/10  2. Acute on chronic systolic HF -> cardiogenic shock:  Nonischemic cardiomyopathy. ECHO 11/2016 EF 25-30% CMRI EF 22%. Bedside echo in HF clinic EF 20% in 3/19.  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 Did not respond to steroids. Admitted 4/6 with cardiogenic shock and volume overload  co-ox 38%. Echo 04/23/17: LVEF 20-25% with large LV clot, RV mild to moderately down.   -PICC out 5/12.  - Volume status stable.  - Weight down yesterday. Creatinine rising. Will back lasix down to 40 daily.  - Start losartan 25 for HTN  3. Atrial flutter: New onset. S/p DCCV to NSR on 4/12.  - Maintaining NSR.  -Continue amio 200 mg daily   4. AKI CKD Stage II-III: Likely ATN due to shock.   - Creatinine up slightly yesterday. Decrease lasix. Await labs this am.  -5.  H/o LV Thrombus -   - resected with VAD placement 6. DMII - Per TCTS.  - No change to current plan.   7. Hypothyroidism:  - TFTs OK 04/22/17. TSH elevated at 10.848 4/23. T4 and T3 normal.  - No change to current plan.   8. RLE erythema/cellulitis:  9. ID - Covering with vanc, meropenum, and fluconazole. Tmax 101.8 again  - CT Chest 05/23/17 resolution of R pleural effusion. Small left pleural effusion with suggestion of small loculated component over the lateral left upper lobe. Bibasilar linear atelectasis.  - Blood CX 5/13 ---> cultures  negative - Per Dr. Donata Clay will give course of diflucan. Day 4/4 - Will complete meropenem 7 day course then stop abx. Encouraged IS and flutter valve  - ID and dental team consulted yestreday. CT jaw without infection per CT 10. Anemia-post-op blood loss - improved s/p 1u RBC 5/10 - back down to 9.5 yesterday. Reassess today      [name], MD  05/30/2017 6:15 AM   Advanced Heart Failure Team Pager [phone] (M-F; 7a - 4p)  Please contact [facility] for night-coverage after hours (4p -7a ) and weekends on [email or web address]

## 2017-05-30 NOTE — Progress Notes (Signed)
INFECTIOUS DISEASE PROGRESS NOTE  ID: Benjamin Sherman is a 47 y.o. male with  Principal Problem:   Fever and chills Active Problems:   LV (left ventricular) mural thrombus without MI   Subarachnoid hemorrhage (HCC)   Chronic systolic CHF (congestive heart failure) (HCC)   CKD (chronic kidney disease), stage III (HCC)   Diabetes mellitus type 2, uncontrolled, with complications (HCC)   Fluid overload   Cardiogenic shock (HCC)   Acute on chronic systolic and diastolic heart failure, NYHA class 4 (HCC)   PAF (paroxysmal atrial fibrillation) (HCC)   Advance care planning   Goals of care, counseling/discussion   Palliative care encounter   LVAD (left ventricular assist device) present (HCC)  Subjective: No complaints, denies pain.  Abtx:  Anti-infectives (From admission, onward)   Start     Dose/Rate Route Frequency Ordered Stop   05/28/17 1600  meropenem (MERREM) 1 g in sodium chloride 0.9 % 100 mL IVPB     1 g 200 mL/hr over 30 Minutes Intravenous Every 8 hours 05/28/17 1457 06/03/17 2359   05/27/17 1115  fluconazole (DIFLUCAN) tablet 100 mg     100 mg Oral Daily 05/27/17 1105 05/30/17 0845   05/23/17 1730  vancomycin (VANCOCIN) IVPB 750 mg/150 ml premix     750 mg 150 mL/hr over 60 Minutes Intravenous Every 12 hours 05/23/17 0643     05/23/17 0000  piperacillin-tazobactam (ZOSYN) IVPB 3.375 g  Status:  Discontinued     3.375 g 12.5 mL/hr over 240 Minutes Intravenous Every 8 hours 05/22/17 2353 05/28/17 1455   05/22/17 2200  ceFAZolin (ANCEF) IVPB 2g/100 mL premix  Status:  Discontinued     2 g 200 mL/hr over 30 Minutes Intravenous Every 8 hours 05/22/17 2058 05/22/17 2347   05/22/17 2100  vancomycin (VANCOCIN) IVPB 1000 mg/200 mL premix  Status:  Discontinued     1,000 mg 200 mL/hr over 60 Minutes Intravenous Every 8 hours 05/22/17 2058 05/23/17 0643   05/19/17 1000  vancomycin (VANCOCIN) 500 mg in sodium chloride 0.9 % 100 mL IVPB  Status:  Discontinued     500 mg 100  mL/hr over 60 Minutes Intravenous Every 12 hours 05/19/17 0813 05/20/17 0803   05/18/17 2200  vancomycin (VANCOCIN) 500 mg in sodium chloride 0.9 % 100 mL IVPB  Status:  Discontinued     500 mg 100 mL/hr over 60 Minutes Intravenous Every 12 hours 05/18/17 1016 05/19/17 0813   05/17/17 1700  cefUROXime (ZINACEF) 1.5 g in sodium chloride 0.9 % 100 mL IVPB     1.5 g 200 mL/hr over 30 Minutes Intravenous Every 12 hours 05/17/17 0923 05/19/17 0717   05/17/17 0930  vancomycin (VANCOCIN) IVPB 1000 mg/200 mL premix  Status:  Discontinued     1,000 mg 200 mL/hr over 60 Minutes Intravenous Every 12 hours 05/17/17 0923 05/18/17 1014   05/16/17 0800  rifampin (RIFADIN) capsule 600 mg     600 mg Oral  Once 05/15/17 1328 05/16/17 0848   05/16/17 0600  fluconazole (DIFLUCAN) IVPB 400 mg     400 mg 100 mL/hr over 120 Minutes Intravenous  Once 05/15/17 1327 05/16/17 0740   05/15/17 2000  vancomycin (VANCOCIN) IVPB 1000 mg/200 mL premix     1,000 mg 200 mL/hr over 60 Minutes Intravenous Every 12 hours 05/15/17 1328 05/16/17 2034   05/15/17 1600  cefUROXime (ZINACEF) 1.5 g in sodium chloride 0.9 % 100 mL IVPB     1.5 g 200 mL/hr over 30 Minutes  Intravenous Every 12 hours 05/15/17 1327 05/17/17 0526   05/15/17 1048  vancomycin (VANCOCIN) powder  Status:  Discontinued       As needed 05/15/17 1048 05/15/17 1320   05/15/17 0400  vancomycin (VANCOCIN) 1,500 mg in sodium chloride 0.9 % 250 mL IVPB  Status:  Discontinued     1,500 mg 125 mL/hr over 120 Minutes Intravenous To Surgery 05/14/17 0828 05/14/17 1118   05/15/17 0400  cefUROXime (ZINACEF) 1.5 g in sodium chloride 0.9 % 100 mL IVPB  Status:  Discontinued     1.5 g 200 mL/hr over 30 Minutes Intravenous To Surgery 05/14/17 0828 05/14/17 1118   05/15/17 0400  cefUROXime (ZINACEF) 750 mg in sodium chloride 0.9 % 100 mL IVPB  Status:  Discontinued     750 mg 200 mL/hr over 30 Minutes Intravenous To Surgery 05/14/17 0828 05/14/17 1118   05/15/17 0400   fluconazole (DIFLUCAN) IVPB 400 mg  Status:  Discontinued     400 mg 100 mL/hr over 120 Minutes Intravenous To Surgery 05/14/17 0828 05/14/17 1118   05/15/17 0400  rifampin (RIFADIN) 600 mg in sodium chloride 0.9 % 100 mL IVPB  Status:  Discontinued     600 mg 200 mL/hr over 30 Minutes Intravenous To Surgery 05/14/17 0828 05/14/17 1118   05/15/17 0400  vancomycin (VANCOCIN) powder 1,000 mg  Status:  Discontinued     1,000 mg Other To Surgery 05/14/17 0828 05/14/17 1118   05/15/17 0400  vancomycin (VANCOCIN) 1,250 mg in sodium chloride 0.9 % 250 mL IVPB     1,250 mg 166.7 mL/hr over 90 Minutes Intravenous To Surgery 05/14/17 1444 05/15/17 0938   05/15/17 0400  cefUROXime (ZINACEF) 1.5 g in sodium chloride 0.9 % 100 mL IVPB     1.5 g 200 mL/hr over 30 Minutes Intravenous To Surgery 05/14/17 1444 05/15/17 1204   05/15/17 0400  cefUROXime (ZINACEF) 750 mg in sodium chloride 0.9 % 100 mL IVPB  Status:  Discontinued     750 mg 200 mL/hr over 30 Minutes Intravenous To Surgery 05/14/17 1444 05/15/17 1323   05/15/17 0400  fluconazole (DIFLUCAN) IVPB 400 mg     400 mg 100 mL/hr over 120 Minutes Intravenous To Surgery 05/14/17 1444 05/15/17 0941   05/15/17 0400  rifampin (RIFADIN) 600 mg in sodium chloride 0.9 % 100 mL IVPB     600 mg 200 mL/hr over 30 Minutes Intravenous To Surgery 05/14/17 1444 05/15/17 0854   05/15/17 0400  vancomycin (VANCOCIN) powder 1,000 mg     1,000 mg Other To Surgery 05/14/17 1444 05/15/17 0926   05/10/17 0000  ceFAZolin (ANCEF) IVPB 2g/100 mL premix     2 g 200 mL/hr over 30 Minutes Intravenous  Once 05/08/17 1246 05/10/17 1017   05/05/17 1000  doxycycline (VIBRA-TABS) tablet 100 mg     100 mg Oral Every 12 hours 05/05/17 0938 05/11/17 2141   04/21/17 1500  cefTRIAXone (ROCEPHIN) 1 g in sodium chloride 0.9 % 100 mL IVPB     1 g 200 mL/hr over 30 Minutes Intravenous  Once 04/21/17 1455 04/21/17 1546   04/21/17 1500  azithromycin (ZITHROMAX) 500 mg in sodium chloride  0.9 % 250 mL IVPB     500 mg 250 mL/hr over 60 Minutes Intravenous  Once 04/21/17 1455 04/21/17 1738      Medications:  Scheduled: . allopurinol  100 mg Oral Daily  . amiodarone  200 mg Oral Daily  . aspirin EC  81 mg Oral Daily  .  bisacodyl  10 mg Oral Daily   Or  . bisacodyl  10 mg Rectal Daily  . chlorhexidine  15 mL Mouth/Throat BID  . clonazePAM  1 mg Oral QHS  . docusate sodium  200 mg Oral Daily  . feeding supplement (PRO-STAT SUGAR FREE 64)  30 mL Oral BID  . ferrous fumarate-b12-vitamic C-folic acid  1 capsule Oral BID  . furosemide  40 mg Oral Daily  . guaiFENesin  600 mg Oral BID  . insulin aspart  0-15 Units Subcutaneous TID WC  . insulin aspart  0-5 Units Subcutaneous QHS  . losartan  25 mg Oral Daily  . pantoprazole  40 mg Oral Daily  . potassium chloride  40 mEq Oral Daily  . sodium chloride flush  10-40 mL Intracatheter Q12H  . warfarin  5 mg Oral ONCE-1800  . Warfarin - Pharmacist Dosing Inpatient   Does not apply q1800    Objective: Vital signs in last 24 hours: Temp:  [99.1 F (37.3 C)-101.8 F (38.8 C)] 99.9 F (37.7 C) (05/15 0744) Pulse Rate:  [90-105] 97 (05/15 0744) Resp:  [21-34] 25 (05/15 0744) BP: (96-117)/(77-90) 104/77 (05/15 0744) SpO2:  [97 %-100 %] 98 % (05/15 0744) Weight:  [88.9 kg (195 lb 15.8 oz)] 88.9 kg (195 lb 15.8 oz) (05/15 0536)   General appearance: alert and no distress Throat: normal findings: no tenderness along R mandible.  Resp: clear to auscultation bilaterally Cardio: regular rate and rhythm GI: normal findings: bowel sounds normal, soft, non-tender and driveline site non-tender  Lab Results Recent Labs    05/29/17 0842 05/30/17 0246  WBC 8.8 9.5  HGB 10.1* 9.5*  HCT 31.1* 28.9*  NA 132* 129*  K 3.6 3.9  CL 95* 95*  CO2 27 23  BUN 15 15  CREATININE 1.18 1.34*   Liver Panel Recent Labs    05/29/17 0842 05/30/17 0246  PROT 8.4* 7.8  ALBUMIN 2.3* 2.1*  AST 26 25  ALT 13* 13*  ALKPHOS 201* 175*    BILITOT 1.3* 1.0   Sedimentation Rate No results for input(s): ESRSEDRATE in the last 72 hours. C-Reactive Protein No results for input(s): CRP in the last 72 hours.  Microbiology: Recent Results (from the past 240 hour(s))  Culture, blood (routine x 2)     Status: None   Collection Time: 05/22/17  9:30 PM  Result Value Ref Range Status   Specimen Description BLOOD LEFT ANTECUBITAL  Final   Special Requests   Final    BOTTLES DRAWN AEROBIC AND ANAEROBIC Blood Culture adequate volume   Culture   Final    NO GROWTH 5 DAYS Performed at [facility], [address], [place], [place] [address]    Report Status 05/27/2017 FINAL  Final  Culture, blood (routine x 2)     Status: None   Collection Time: 05/22/17  9:37 PM  Result Value Ref Range Status   Specimen Description BLOOD LEFT HAND  Final   Special Requests   Final    BOTTLES DRAWN AEROBIC AND ANAEROBIC Blood Culture adequate volume   Culture   Final    NO GROWTH 5 DAYS Performed at [facility], [address], [place], [place] [address]    Report Status 05/27/2017 FINAL  Final  Urine Culture     Status: None   Collection Time: 05/24/17  3:10 PM  Result Value Ref Range Status   Specimen Description URINE, CLEAN CATCH  Final   Special Requests  Normal  Final   Culture   Final    NO GROWTH Performed at [facility], [address], [place], [place] [address]    Report Status 05/25/2017 FINAL  Final  Culture, blood (Routine X 2) w Reflex to ID Panel     Status: None (Preliminary result)   Collection Time: 05/28/17  6:16 AM  Result Value Ref Range Status   Specimen Description BLOOD RIGHT ANTECUBITAL  Final   Special Requests   Final    BOTTLES DRAWN AEROBIC ONLY Blood Culture adequate volume   Culture   Final    NO GROWTH 1 DAY Performed at [facility], [address], [place], [place] [address]    Report Status PENDING  Incomplete  Culture, blood (Routine X 2) w Reflex to ID Panel     Status:  None (Preliminary result)   Collection Time: 05/28/17  6:20 AM  Result Value Ref Range Status   Specimen Description BLOOD RIGHT ANTECUBITAL  Final   Special Requests   Final    BOTTLES DRAWN AEROBIC ONLY Blood Culture adequate volume   Culture   Final    NO GROWTH 1 DAY Performed at [facility], [address] [place], [place] [address]    Report Status PENDING  Incomplete    Studies/Results: Ct Head Wo Contrast  Result Date: 05/28/2017 CLINICAL DATA:  Fever of unknown origin. EXAM: CT HEAD WITHOUT CONTRAST TECHNIQUE: Contiguous axial images were obtained from the base of the skull through the vertex without intravenous contrast. COMPARISON:  CT scan of December 08, 2016. FINDINGS: Brain: Left frontal encephalomalacia is noted consistent with old infarction. No mass effect or midline shift is noted. Ventricular size is within normal limits. There is no evidence of mass lesion, hemorrhage or acute infarction. Vascular: No hyperdense vessel or unexpected calcification. Skull: Normal. Negative for fracture or focal lesion. Sinuses/Orbits: No acute finding. Other: None. 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 No acute intracranial abnormality seen. Electronically Signed   By: Lupita Raider, M.D.   On: 05/28/2017 21:18   Ct Soft Tissue Neck W Contrast  Result Date: 05/29/2017 CLINICAL DATA:  Initial evaluation for dental caries, status post recent dental extraction, concern for abscess. EXAM: CT NECK WITH CONTRAST TECHNIQUE: Multidetector CT imaging of the neck was performed using the standard protocol following the bolus administration of intravenous contrast. CONTRAST:  OMNIPAQUE IOHEXOL 300 MG/ML  SOLN COMPARISON:  None. FINDINGS: Pharynx and larynx: Oral cavity within normal limits without mass lesion or loculated fluid collection. Sequelae of multiple recent dental extractions. Patient now edentulous. Single 9 mm hypodensity along the lingual aspect of the right maxilla (series  3, image 33), suspicious for a possible small odontogenic abscess (series 8, image 34). No other significant inflammatory changes about the maxilla or mandible. No other discrete collections identified. Palatine tonsils symmetric and within normal limits. Parapharyngeal fat preserved. Nasopharynx normal. No retropharyngeal swelling or collection. Epiglottis normal. Vallecula clear. Remainder of the hypopharynx and supraglottic larynx within normal limits. True cords apposed and not well evaluated. Subglottic airway clear. Salivary glands: Salivary glands including the parotid and submandibular glands are within normal limits. Thyroid: Unremarkable. Lymph nodes: No adenopathy within the neck. Vascular: Normal intravascular enhancement seen throughout the neck. Limited intracranial: Unremarkable. Visualized orbits: Visualized globes and orbital soft tissues within normal limits. Mastoids and visualized paranasal sinuses: Scattered mucosal thickening within the bilateral maxillary sinuses and ethmoidal air cells. Superimposed left maxillary sinus retention cyst noted.  Visualized paranasal sinuses otherwise clear. Trace opacity right mastoid air cells noted. Middle ear cavities are clear. Skeleton: No acute osseous abnormality. No worrisome lytic or blastic osseous lesions. Moderate cervical spondylolysis at C4-5 through C7-T1. Median sternotomy wires noted. Upper chest: Shotty subcentimeter lymph nodes noted within the partially visualized upper mediastinum. Moderate layering left pleural effusion partially visualized. Other: None. IMPRESSION: 1. Sequelae of recent dental extraction with 9 mm hypodensity positioned along the lingual aspect of the right maxilla, which could reflect a small/early developing abscess. Correlation with physical exam recommended. 2. No other odontogenic abscess or significant inflammatory changes identified about the maxilla or mandible. 3. Moderate left pleural effusion. Electronically  Signed   By: Rise Mu M.D.   On: 05/29/2017 18:44     Assessment/Plan: Possible R maxillary abscess LVAD (4-30) fever  Total days of antibiotics: 8 (vanco, merrem 2)  His BCx are ngtd.  Continued fever o/n Consider dental re-eval Consider CT chest  No change in anbx for now         Johny Sax MD, FACP Infectious Diseases (pager) [phone] [email or web address] 05/30/2017, 8:58 AM  LOS: 39 days

## 2017-05-30 NOTE — Progress Notes (Signed)
LVAD Coordinator Rounding Note:  Admitted 04/21/17 with cardiogenic shock and atrial flutter.   HM III LVAD implanted on 05/15/17 by Dr. Maren Beach  under Destination Therapy criteria due to limited social support.   Pt sitting up in the bed eating breakfast. Pt is in better spirits this morning.   Vital signs: T max: 101.8 HR: 97 Auto BP:  104/77 (82) Doppler: 92 O2 Sat:  98% on RA Wt: 195>211>212>208>205>207>200>217(inaccurate wt)>198>199>195>196>195lbs   LVAD interrogation reveals:  Speed: 5300 Flow: 4.3 Power:  3.8w PI:  3.4 Alarms: none Events: none Hematocrit: 29 Fixed speed: 5300 Low speed limit: 5000  Drive line site care: Existing VAD dressing removed and site care performed using sterile technique. Drive line exit site cleaned with Chlora prep applicators x 2, allowed to dry, and gauze dressing with silver strip re-applied. Exit site with partial tissue ingrowth, one suture intact, the velour is fully implanted at exit site.Small amount of serousangineous drainage,nofoul odor, or rash noted. Anchor intact.          Labs:  LDH trend: 253>317>287>263>244>265>265>278>270>336>317  INR trend: 1.24>1.68>2.19>2.29>2.12>1.96>2.04>2.01>2.56>1.94>1.39  WBC trend: 16.5>16.1>15.5>11.7>12.3>16.5>19.6>14.9>11.7  Anticoagulation Plan: -INR Goal: 2.0 - 2.5  -ASA Dose: 81 mg (started 05/18/17 with therapeutic INR) - Heparin gtt restarted for low INR at 1450 u/hr  Blood Products:   Intra op: 05/15/17 - 1 unit plts - 1 unit FFP  Post op:  - 05/15/17>1 RBC, 2 FFP, DDAVP, Factor VII -05/16/17>1 PC - 05/25/17 > 1 PC  Device: N/A  Arrythmias: increased ectopy, short run Afib 05/15/17 - Amiodarone started   Respiratory: extubated 05/16/17  Nitric Oxide: off 05/16/17  Gtts: Milrinone> stop 05/22/17 Levo>8 mcg/min-off 5/2 Epi>1 mcg/min-off 5/3 Amiodarone>30 mg/hr-off 5/2 Lasix 6 mg/hr-off 5/3 Heparin restarted 05/29/17 @ 1900 - 1450 u/hr  Infection: - BC drawn  05/22/17>NTD - Vanc and Zosyn started 05/22/17 for temp 101.3 - CT scan 5/8/9 to r/o infection - ID consulted 05/29/17- CT order of neck to r/o abcess from teeth extractions  Adverse Events on VAD: -none  VAD Education:  1. Education completed with Gene and pt on 05/28/17.  Plan/Recommendations:   1. Pt needs to complete Home VAD Logbook daily.  2. Discharge education complete. 3. Call VAD pager if any questions re: VAD equipment or drive line site/care.  [name] RN, VAD Coordinator 24/7 VAD pager: [phone]

## 2017-05-30 NOTE — Progress Notes (Signed)
CSW met with patient at bedside. Patient reports he is feeling better and fever is down. Patient spoke of his sister and reports "she doesn't want to do the dressing change but she is still assisting me". Patient states Gene will be doing the dressing changes and coordinating 24/7 care when he goes home. CSW discussed support and who is patient's "go to" for supportive needs. Patient reports Gene and some other fraternity guys provide support. Patient appears to be in a better place and looking forward to discharge when appropriate. CSW continues to provide supportive needs and will follow patient post hospitalization through the VAD clinic. Raquel Sarna, [place], [place]

## 2017-05-30 NOTE — Plan of Care (Signed)
Continue current care plan 

## 2017-05-30 NOTE — Progress Notes (Signed)
CARDIAC REHAB PHASE I   PRE:  Rate/Rhythm: 102 ST    BP: sitting 86 dopplar    SaO2: wouldn't register  MODE:  Ambulation: 650 ft   POST:  Rate/Rhythm: 72 SR, ? accuracy    BP: sitting 88     SaO2: 96 RA  Pt in bed, RN sts he has been sleeping today since not much sleep last night. Able to don batteries independently. Needed a reminder on how to scoot to EOB. Stood independently and walked with RW. Tolerated well although it did look like his HR was in the 70s and 80s while walking. Back up when he got to room and sat down. VSS, left batteries on.  [phone]   Harriet Masson CES, ACSM 05/30/2017 3:12 PM

## 2017-05-30 NOTE — Progress Notes (Signed)
ANTICOAGULATION CONSULT NOTE - Follow-Up Consult  Pharmacy Consult for Warfarin + Heparin Indication: LVAD  Allergies  Allergen Reactions  . Bee Venom     UNSPECIFIED REACTION     Patient Measurements: Height: 6' (182.9 cm) Weight: 195 lb 15.8 oz (88.9 kg) IBW/kg (Calculated) : 77.6 Heparin Dosing Weight: n/a  Vital Signs: Temp: 99.9 F (37.7 C) (05/15 0744) Temp Source: Oral (05/15 0744) BP: 104/77 (05/15 0744) Pulse Rate: 97 (05/15 0744)  Labs: Recent Labs    05/28/17 0610 05/29/17 0842 05/29/17 1536 05/29/17 2258 05/30/17 0246  HGB 9.2* 10.1*  --   --  9.5*  HCT 28.3* 31.1*  --   --  28.9*  PLT 510* 596*  --   --  523*  LABPROT 22.0* 19.1*  --   --  17.0*  INR 1.94 1.62  --   --  1.39  HEPARINUNFRC  --   --  0.22* 0.43 0.42  CREATININE 1.28* 1.18  --   --  1.34*    Estimated Creatinine Clearance: 74 mL/min (A) (by C-G formula based on SCr of 1.34 mg/dL (H)).   Medical History: Past Medical History:  Diagnosis Date  . CHF (congestive heart failure) (HCC)   . Diabetes mellitus without complication [facility])     Assessment: 47 yo male s/p LVAD placement 4/30 on warfarin per pharmacy. INR has been erratic this admit, appears patient will likely need close to 3-4mg  daily. INR subtherapeutic yesterday so heparin bridge initiated. Hgb stable, LDL down. INR remains subtherapeutic at 1.39, heparin level within goal at 0.42.   Goal of Therapy:  INR goal 2-2.5  Heparin Level 0.3-0.5 Monitor platelets by anticoagulation protocol: Yes   Plan:  -Warfarin 5mg  PO x1 tonight -Continue heparin 1450 units/hr -Daily INR, heparin level, CBC  Fredonia Highland, PharmD, BCPS PGY-2 Cardiology Pharmacy Resident Pager: [phone] 05/30/2017

## 2017-05-30 NOTE — Progress Notes (Signed)
PROGRESS NOTE:  05/30/2017 Benjamin Sherman [id number]  VITALS: BP (!) 88/66 (BP Location: Right Arm)   Pulse 99   Temp (!) 101.2 F (38.4 C) (Oral)   Resp (!) 21   Ht 6' (1.829 m)   Wt 195 lb 15.8 oz (88.9 kg)   SpO2 100%   BMI 26.58 kg/m   LABS:  Lab Results  Component Value Date   WBC 9.5 05/30/2017   HGB 9.5 (L) 05/30/2017   HCT 28.9 (L) 05/30/2017   MCV 84.3 05/30/2017   PLT 523 (H) 05/30/2017   BMET    Component Value Date/Time   NA 129 (L) 05/30/2017 0246   K 3.9 05/30/2017 0246   CL 95 (L) 05/30/2017 0246   CO2 23 05/30/2017 0246   GLUCOSE 127 (H) 05/30/2017 0246   BUN 15 05/30/2017 0246   CREATININE 1.34 (H) 05/30/2017 0246   CALCIUM 8.4 (L) 05/30/2017 0246   GFRNONAA >60 05/30/2017 0246   GFRAA >60 05/30/2017 0246    Lab Results  Component Value Date   INR 1.39 05/30/2017   INR 1.62 05/29/2017   INR 1.94 05/28/2017   No results found for: PTT   Benjamin Sherman is status post extraction of remaining teeth with alveoloplasty and pre-prosthetic surgery as needed in the operating room with general anesthesia.  SUBJECTIVE: The patient is without complaints from dental extraction sites.  EXAM: There is no sign of infection, heme, or ooze. Patient is healing in by generalized primary closure. There are several areas healing in by secondary intention.There is no evidence of abscess formation. A few loose sutures remain. The patient is now edentulous.  ASSESSMENT: Post operative course is consistent with dental procedures performed in the operating room with general anesthesia. Patient is edentulous There is atrophy of the edentulous alveolar ridges   PLAN: 1. Continue saltwater rinses and chlorhexidine rinses as prescribed. 2. Advance diet as tolerated. 3. Follow-up with the dentist of his choice for fabrication of upper lower complete dentures once medically stable from the previous LVAD procedure.   Charlynne Pander, DDS

## 2017-05-31 ENCOUNTER — Inpatient Hospital Stay (HOSPITAL_COMMUNITY): Payer: Medicaid Other

## 2017-05-31 LAB — CBC
HCT: 29.6 % — ABNORMAL LOW (ref 39.0–52.0)
Hemoglobin: 9.6 g/dL — ABNORMAL LOW (ref 13.0–17.0)
MCH: 27.1 pg (ref 26.0–34.0)
MCHC: 32.4 g/dL (ref 30.0–36.0)
MCV: 83.6 fL (ref 78.0–100.0)
Platelets: 532 10*3/uL — ABNORMAL HIGH (ref 150–400)
RBC: 3.54 MIL/uL — ABNORMAL LOW (ref 4.22–5.81)
RDW: 15.8 % — ABNORMAL HIGH (ref 11.5–15.5)
WBC: 8.2 10*3/uL (ref 4.0–10.5)

## 2017-05-31 LAB — GLUCOSE, CAPILLARY
Glucose-Capillary: 158 mg/dL — ABNORMAL HIGH (ref 65–99)
Glucose-Capillary: 97 mg/dL (ref 65–99)
Glucose-Capillary: 133 mg/dL — ABNORMAL HIGH (ref 65–99)
Glucose-Capillary: 102 mg/dL — ABNORMAL HIGH (ref 65–99)
Glucose-Capillary: 112 mg/dL — ABNORMAL HIGH (ref 65–99)

## 2017-05-31 LAB — HEPARIN LEVEL (UNFRACTIONATED): Heparin Unfractionated: 0.42 IU/mL (ref 0.30–0.70)

## 2017-05-31 LAB — PROTIME-INR
INR: 1.44
Prothrombin Time: 17.4 seconds — ABNORMAL HIGH (ref 11.4–15.2)

## 2017-05-31 LAB — BASIC METABOLIC PANEL
Anion gap: 10 (ref 5–15)
BUN: 16 mg/dL (ref 6–20)
CO2: 26 mmol/L (ref 22–32)
Calcium: 8.7 mg/dL — ABNORMAL LOW (ref 8.9–10.3)
Chloride: 94 mmol/L — ABNORMAL LOW (ref 101–111)
Creatinine, Ser: 1.18 mg/dL (ref 0.61–1.24)
GFR calc Af Amer: >60 mL/min (ref >60)
GFR calc non Af Amer: >60 mL/min (ref >60)
Glucose, Bld: 118 mg/dL — ABNORMAL HIGH (ref 65–99)
Potassium: 4.1 mmol/L (ref 3.5–5.1)
Sodium: 130 mmol/L — ABNORMAL LOW (ref 135–145)

## 2017-05-31 LAB — LACTATE DEHYDROGENASE: LDH: 318 U/L — ABNORMAL HIGH (ref 98–192)

## 2017-05-31 MED ORDER — IOPAMIDOL (ISOVUE-300) INJECTION 61%
INTRAVENOUS | Status: AC
  Filled 2017-05-31: qty 30

## 2017-05-31 MED ORDER — WARFARIN SODIUM 7.5 MG PO TABS
7.5000 mg | ORAL_TABLET | Freq: Once | ORAL | Status: AC
  Administered 2017-05-31: 7.5 mg via ORAL
  Filled 2017-05-31: qty 1

## 2017-05-31 MED ORDER — IOPAMIDOL (ISOVUE-300) INJECTION 61%: 30.0000 mL | INTRAVENOUS | Status: AC

## 2017-05-31 MED ORDER — WARFARIN SODIUM 5 MG PO TABS
5.0000 mg | ORAL_TABLET | Freq: Once | ORAL | Status: DC
Start: 2017-05-31 — End: 2017-05-31

## 2017-05-31 NOTE — Progress Notes (Signed)
LVAD Coordinator Rounding Note:  Admitted 04/21/17 with cardiogenic shock and atrial flutter.   HM III LVAD implanted on 05/15/17 by Dr. Maren Beach  under Destination Therapy criteria due to limited social support.   Pt getting up out of bed with PT this am. He is walking more and more each day. Is anxious to go home.  Vital signs: T max: 99.4 HR: 101 Auto BP:  104/86 (94) Doppler: 94 O2 Sat:  96% on RA Wt: 195>211>212>208>205>207>200>217(inaccurate wt)>198>199>195>196>195>198 lbs   LVAD interrogation reveals:  Speed: 5300 Flow: 4.5 Power: 4.0w PI:  3.2 Alarms: none Events: none Hematocrit: 29 Fixed speed: 5300 Low speed limit: 5000  Drive line site care: Existing VAD dressing removed and site care performed using sterile technique. Drive line exit site cleaned with Chlora prep applicators x 2, allowed to dry, and gauze dressing with silver strip re-applied. Exit site with partial tissue ingrowth, one suture intact, the velour is fully implanted at exit site.Small amount of tan drainage,nofoul odor, tenderness, redness, or rash noted. Anchor intact.    Labs:  LDH trend: 253>317>287>263>244>265>265>278>270>336>317>318  INR trend: 1.24>1.68>2.19>2.29>2.12>1.96>2.04>2.01>2.56>1.94>1.39>1.44   Anticoagulation Plan: -INR Goal: 2.0 - 2.5  -ASA Dose: 81 mg (started 05/18/17 with therapeutic INR) - Heparin gtt restarted for low INR at 1450 u/hr  Blood Products:   Intra op: 05/15/17 - 1 unit plts - 1 unit FFP  Post op:  - 05/15/17>1 RBC, 2 FFP, DDAVP, Factor VII -05/16/17>1 PC - 05/25/17 > 1 PC  Device: N/A  Arrythmias: increased ectopy, short run Afib 05/15/17 - Amiodarone started   Respiratory: extubated 05/16/17  Nitric Oxide: off 05/16/17  Gtts: Milrinone> stop 05/22/17 Levo>8 mcg/min-off 5/2 Epi>1 mcg/min-off 5/3 Amiodarone>30 mg/hr-off 5/2 Lasix 6 mg/hr-off 5/3 Heparin restarted 05/29/17 @ 1900 - 1450 u/hr  Infection: - BC drawn 05/22/17>NTD - Vanc and Zosyn started  05/22/17 for temp 101.3 - CT scan 5/8/9 to r/o infection - ID consulted 05/29/17- CT order of neck to r/o abcess from teeth extractions  Adverse Events on VAD: -none  VAD Education:  1. Education completed with Gene and pt on 05/28/17.  Plan/Recommendations:   1. Pt needs to complete Home VAD Logbook daily.  2. Discharge education complete. 3. Daily dressing changes per VAD coordinator, trained caregiver, or Nurse Alla Feeling. 4. Call VAD pager if any questions re: VAD equipment or drive line site/care.  [name] RN, VAD Coordinator 24/7 VAD pager: [phone]

## 2017-05-31 NOTE — Progress Notes (Addendum)
Patient ID: Benjamin Sherman, male   DOB: 16-Oct-1970, 47 y.o.   MRN: [id number]    Advanced Heart Failure Rounding Note  Subjective:    Events: -Admitted 04/21/17 with cardiogenic shock and atrial flutter.  -Central line placed. Initial co-ox 39%. -IABP placed 04/25/17-> 04/30/17 with persistent cardiogenic shock.  -DCCV atrial flutter 4/12.   -S/p multiple teeth extractions 05/11/17.  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  -Extubated on 5/1  - driveline trauma- from repositioning in bed. 5/9 - 5/10 Received 1uRBCs on 5/10. - 5/10 Ramp echo with speed turned down to 5300.  CT Chest 05/23/17 resolution of R pleural effusion. Small left pleural effusion with suggestion of small loculated component over the lateral left upper lobe. Bibasilar linear atelectasis.   Says he is going stir crazy. Completing vancomycin today. Continue meropenum for total of 7 days. Still with low grade fevers. WBC ok. ID recommends repeat CT C/A/P   Yesterday lasix cut back to 40 mg daily and losartan was increased to 25 mg daily.   Wants to go outside. Denies SOB.   LVAD Interrogation HM 3: Speed: 5300 Flow: 4.4  PI: 3.1  Power: 4. No PI events.     Objective:   Weight Range:  Vital Signs:   Temp:  [97.9 F (36.6 C)-102 F (38.9 C)] 99.3 F (37.4 C) (05/16 0747) Pulse Rate:  [94-117] 100 (05/16 0300) Resp:  [21-30] 26 (05/16 0300) BP: (84-104)/(58-91) 104/86 (05/16 0747) SpO2:  [93 %-100 %] 96 % (05/16 0300) Weight:  [198 lb 9.6 oz (90.1 kg)] 198 lb 9.6 oz (90.1 kg) (05/16 0641) Last BM Date: 05/28/17  Weight change: Filed Weights   05/29/17 0446 05/30/17 0536 05/31/17 0641  Weight: 196 lb 9.6 oz (89.2 kg) 195 lb 15.8 oz (88.9 kg) 198 lb 9.6 oz (90.1 kg)   Intake/Output:   Intake/Output Summary (Last 24 hours) at 05/31/2017 0825 Last data filed at 05/31/2017 0534 Gross per 24 hour  Intake 1559.3 ml  Output 1050 ml  Net 509.3 ml    Physical Exam   Physical Exam: GENERAL: Sitting in the chair. NAD HEENT: normal  anicteric edentulous NECK: Supple, JVP 5-6  .  2+ bilaterally, no bruits.  No lymphadenopathy or thyromegaly appreciated.   CARDIAC:  Mechanical heart sounds with LVAD hum present.  LUNGS:  Clear to auscultation bilaterally. On room air. No wheeze ABDOMEN:  Soft, round, nontender, positive bowel sounds x4.     LVAD exit site: .  Dressing dry and intact.  No erythema or drainage.  Stabilization device present and accurately applied.  Driveline dressing is being changed daily per sterile technique. Extremities: no cyanosis, clubbing, rash, edema Neuro: alert & oriented x 3, cranial nerves grossly intact. moves all 4 extremities w/o difficulty. Affect pleasant   Telemetry    NSR 80-90s Personally reviewed    EKG   No new tracings.    Labs    Basic Metabolic Panel: Recent Labs  Lab 05/27/17 0436 05/28/17 0610 05/29/17 0842 05/30/17 0246 05/31/17 0702  NA 132* 132* 132* 129* 130*  K 3.6 3.8 3.6 3.9 4.1  CL 96* 96* 95* 95* 94*  CO2 26 26 27 23 26   GLUCOSE 135* 113* 113* 127* 118*  BUN 16 17 15 15 16   CREATININE 1.13 1.28* 1.18 1.34* 1.18  CALCIUM 8.7* 8.8* 8.9 8.4* 8.7*   Liver Function Tests: Recent Labs  Lab 05/26/17 0425 05/27/17 0436 05/28/17 0610 05/29/17 0842 05/30/17 0246  AST 21 23 25 26 25   ALT 10*  12* 13* 13* 13*  ALKPHOS 177* 189* 187* 201* 175*  BILITOT 1.8* 1.2 1.6* 1.3* 1.0  PROT 7.6 7.7 8.2* 8.4* 7.8  ALBUMIN 2.1* 2.0* 2.3* 2.3* 2.1*   No results for input(s): LIPASE, AMYLASE in the last 168 hours. No results for input(s): AMMONIA in the last 168 hours.  CBC: Recent Labs  Lab 05/27/17 0436 05/28/17 0610 05/29/17 0842 05/30/17 0246 05/31/17 0702  WBC 12.8* 11.7* 8.8 9.5 8.2  HGB 9.0* 9.2* 10.1* 9.5* 9.6*  HCT 27.0* 28.3* 31.1* 28.9* 29.6*  MCV 83.6 84.0 84.3 84.3 83.6  PLT 517* 510* 596* 523* 532*   Cardiac Enzymes: No results for input(s): CKTOTAL, CKMB, CKMBINDEX, TROPONINI in the last 168 hours. BNP: BNP (last 3 results) Recent  Labs    04/21/17 1212 05/16/17 0529 05/22/17 0403  BNP 483.0* 262.3* 221.3*   ProBNP (last 3 results) No results for input(s): PROBNP in the last 8760 hours.  Other results:  Imaging: Ct Soft Tissue Neck W Contrast  Result Date: 05/29/2017 CLINICAL DATA:  Initial evaluation for dental caries, status post recent dental extraction, concern for abscess. EXAM: CT NECK WITH CONTRAST TECHNIQUE: Multidetector CT imaging of the neck was performed using the standard protocol following the bolus administration of intravenous contrast. CONTRAST:  OMNIPAQUE IOHEXOL 300 MG/ML  SOLN COMPARISON:  None. FINDINGS: Pharynx and larynx: Oral cavity within normal limits without mass lesion or loculated fluid collection. Sequelae of multiple recent dental extractions. Patient now edentulous. Single 9 mm hypodensity along the lingual aspect of the right maxilla (series 3, image 33), suspicious for a possible small odontogenic abscess (series 8, image 34). No other significant inflammatory changes about the maxilla or mandible. No other discrete collections identified. Palatine tonsils symmetric and within normal limits. Parapharyngeal fat preserved. Nasopharynx normal. No retropharyngeal swelling or collection. Epiglottis normal. Vallecula clear. Remainder of the hypopharynx and supraglottic larynx within normal limits. True cords apposed and not well evaluated. Subglottic airway clear. Salivary glands: Salivary glands including the parotid and submandibular glands are within normal limits. Thyroid: Unremarkable. Lymph nodes: No adenopathy within the neck. Vascular: Normal intravascular enhancement seen throughout the neck. Limited intracranial: Unremarkable. Visualized orbits: Visualized globes and orbital soft tissues within normal limits. Mastoids and visualized paranasal sinuses: Scattered mucosal thickening within the bilateral maxillary sinuses and ethmoidal air cells. Superimposed left maxillary sinus retention  cyst noted. Visualized paranasal sinuses otherwise clear. Trace opacity right mastoid air cells noted. Middle ear cavities are clear. Skeleton: No acute osseous abnormality. No worrisome lytic or blastic osseous lesions. Moderate cervical spondylolysis at C4-5 through C7-T1. Median sternotomy wires noted. Upper chest: Shotty subcentimeter lymph nodes noted within the partially visualized upper mediastinum. Moderate layering left pleural effusion partially visualized. Other: None. IMPRESSION: 1. Sequelae of recent dental extraction with 9 mm hypodensity positioned along the lingual aspect of the right maxilla, which could reflect a small/early developing abscess. Correlation with physical exam recommended. 2. No other odontogenic abscess or significant inflammatory changes identified about the maxilla or mandible. 3. Moderate left pleural effusion. Electronically Signed   By: Rise Mu M.D.   On: 05/29/2017 18:44    Medications:    Scheduled Medications: . allopurinol  100 mg Oral Daily  . amiodarone  200 mg Oral Daily  . aspirin EC  81 mg Oral Daily  . bisacodyl  10 mg Oral Daily   Or  . bisacodyl  10 mg Rectal Daily  . chlorhexidine  15 mL Mouth/Throat BID  . clonazePAM  1  mg Oral QHS  . docusate sodium  200 mg Oral Daily  . feeding supplement (PRO-STAT SUGAR FREE 64)  30 mL Oral BID  . ferrous fumarate-b12-vitamic C-folic acid  1 capsule Oral BID  . furosemide  40 mg Oral Daily  . guaiFENesin  600 mg Oral BID  . insulin aspart  0-15 Units Subcutaneous TID WC  . insulin aspart  0-5 Units Subcutaneous QHS  . losartan  25 mg Oral Daily  . pantoprazole  40 mg Oral Daily  . potassium chloride  40 mEq Oral Daily  . sodium chloride flush  10-40 mL Intracatheter Q12H  . Warfarin - Pharmacist Dosing Inpatient   Does not apply q1800    Infusions: . sodium chloride    . sodium chloride    . heparin 1,450 Units/hr (05/30/17 1900)  . lactated ringers Stopped (05/22/17 0926)  .  meropenem (MERREM) IV Stopped (05/31/17 0728)  . vancomycin 750 mg (05/31/17 0534)    PRN Medications:    Assessment:   Benjamin Sherman is a 47 y.o. 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 eosinophilic cardiomyopathy, LV thrombus, hypothyroidism, DM2, CKD Stage II-III and traumatic [facility] 11/18 after fall.   Admitted with cardiogenic shock.  Plan/Discussion:    1. 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  - Continue ASA 81 daily.  - Warfarin for goal INR 2-2.5  -INR 1.4. On heparin drip. Continue coumadin.   - Discussed dosing with PharmD personally. Will need to bump dose  - Driveline was pulled while being repositioned in bed on 05/24/2017. Per VAD coordinator, velour is not exposed. Increased exudate noted. VAD coordinators following On zosyn.  - Speed adjusted to 5300 with ramp echo 5/10  2. Acute on chronic systolic HF -> cardiogenic shock:  Nonischemic cardiomyopathy. ECHO 11/2016 EF 25-30% CMRI EF 22%. Bedside echo in HF clinic EF 20% in 3/19.  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 Did not respond to steroids. Admitted 4/6 with cardiogenic shock and volume overload co-ox 38%. Echo 04/23/17: LVEF 20-25% with large LV clot, RV mild to moderately down.   -PICC out 5/12.  - Volume stable. Continue lasix 40 mg daily.  - Continue  losartan 25 mg daily.  3. Atrial flutter: New onset. S/p DCCV to NSR on 4/12.  - Maintaining NSR.  -Continue amio 200 mg daily   4. AKI CKD Stage II-III: Likely ATN due to shock.   - Stable today.   -5. H/o LV Thrombus -   - resected with VAD placement - Continue heparin drip + coumadin.  6. DMII - Per TCTS.  - No change to current plan.   7. Hypothyroidism:  - TFTs OK 04/22/17. TSH elevated at 10.848 4/23. T4 and T3 normal.  - No change to current plan.   8. RLE erythema/cellulitis:  9. ID - Covering with vanc, meropenum, and fluconazole. Tmax 101.8 again  - CT Chest 05/23/17 resolution of R pleural  effusion. Small left pleural effusion with suggestion of small loculated component over the lateral left upper lobe. Bibasilar linear atelectasis.  - Blood CX 5/13 ---> cultures  negative - Completed 4 days diflucan.  - Completed meropenem 7 day . Stop date 5/19  - Day  10/10 Vancomycin.  - ID recommendations appreciated.  -CT jaw without infection per CT 10. Anemia-post-op blood loss - improved s/p 1u RBC 5/10 - Hgb 9.6 . CBC in am.    Hopefully home next week.  Tonye Becket, NP  05/31/2017 8:25 AM   Advanced Heart Failure Team Pager [phone] (M-F; 7a - 4p)  Please contact [facility] for night-coverage after hours (4p -7a ) and weekends on [email or web address]  Patient seen and examined with the above-signed Advanced Practice Provider and/or Housestaff. I personally reviewed laboratory data, imaging studies and relevant notes. I independently examined the patient and formulated the important aspects of the plan. I have edited the note to reflect any of my changes or salient points. I have personally discussed the plan with the patient and/or family.  Overall improving but still with persistent fevers. No localizing source. WBC ok. ID has seen. Will finish Vanc today and meropenem tomorrow. Cultures negative. CT C/AP ordered. Possible drug fever? Continue IS.   Volume status stable. VAD interrogated personally. Parameters stable. INR low. Continue heparin and warfarin. Discussed dosing with PharmD personally.  [name], MD  4:32 PM

## 2017-05-31 NOTE — Progress Notes (Signed)
Physical Therapy Treatment Patient Details Name: Jamy Cleckler MRN: [id number] DOB: 1970-04-07 Today's Date: 05/31/2017    History of Present Illness 47 yo admitted with cardiogenic shock, s/p RHC and IABP insertion with continued shock 4/29 , teeth extraction 4/26, HM111 4/30, extubated 5/1. PMHx: HF, cardiomyopathy, LV thrombus, hypothyroidism, DM, CKD, traumatic [facility] 11/2016    PT Comments    Pt pleasant and sitting EOB on arrival today. Pt reports being somewhat distraught over learning a friend was killed in [facility] yesterday. Pt with improved ability to transition to battery but continues to confuse lines and need cues to attend to lines and connect batteries appropriately. Pt with assist to don holster to maintain precautions as well, remaining on battery end of session. Pt walked to outside door to the feel the sun and reported it made a huge difference for him.  HR 102-115 with 1/4 DOE without pulse ox reading BP 104/86 (94)   Follow Up Recommendations  Home health PT;Supervision/Assistance - 24 hour     Equipment Recommendations  Rolling walker with 5" wheels;3in1 (PT)    Recommendations for Other Services       Precautions / Restrictions Precautions Precautions: Sternal Precaution Comments: LVAD  Restrictions Weight Bearing Restrictions: Yes    Mobility  Bed Mobility               General bed mobility comments: EOB on arrival  Transfers Overall transfer level: Needs assistance   Transfers: Sit to/from Stand Sit to Stand: Min guard         General transfer comment: required 2 trials to stand from low bed, good hand placement and cues for increased anterior translation  Ambulation/Gait Ambulation/Gait assistance: Min guard Ambulation Distance (Feet): 900 Feet Assistive device: None Gait Pattern/deviations: Step-through pattern;Decreased stride length   Gait velocity interpretation: 1.31 - 2.62 ft/sec, indicative of limited community ambulator General  Gait Details: pt veering right throughout gait with cues to step left. Pt with knees shaking midway with report of being cold and declined need to sit   Stairs             Wheelchair Mobility    Modified Rankin (Stroke Patients Only)       Balance Overall balance assessment: Needs assistance   Sitting balance-Leahy Scale: Good       Standing balance-Leahy Scale: Good                              Cognition Arousal/Alertness: Awake/alert Behavior During Therapy: WFL for tasks assessed/performed Overall Cognitive Status: Impaired/Different from baseline Area of Impairment: Problem solving;Memory                     Memory: Decreased short-term memory       Problem Solving: Slow processing General Comments: pt able to recall all precautions today and only required one cue with transition to battery for attention to which lines to connect. Cued to get in habit of having controller side in right hand to only drop left hand with power transition      Exercises      General Comments        Pertinent Vitals/Pain Pain Assessment: No/denies pain    Home Living                      Prior Function            PT Goals (current goals  can now be found in the care plan section) Progress towards PT goals: Progressing toward goals    Frequency    Min 3X/week      PT Plan Current plan remains appropriate    Co-evaluation              AM-PAC PT "6 Clicks" Daily Activity  Outcome Measure  Difficulty turning over in bed (including adjusting bedclothes, sheets and blankets)?: A Little Difficulty moving from lying on back to sitting on the side of the bed? : A Little Difficulty sitting down on and standing up from a chair with arms (e.g., wheelchair, bedside commode, etc,.)?: A Little Help needed moving to and from a bed to chair (including a wheelchair)?: A Little Help needed walking in hospital room?: A Little Help needed  climbing 3-5 steps with a railing? : A Little 6 Click Score: 18    End of Session   Activity Tolerance: Patient tolerated treatment well Patient left: in chair;with call bell/phone within reach Nurse Communication: Mobility status;Precautions       Time: 2549-8264 PT Time Calculation (min) (ACUTE ONLY): 33 min  Charges:  $Gait Training: 8-22 mins $Therapeutic Activity: 8-22 mins                    G Codes:       [name], PT [phone]    Deonna Krummel B Sarina Robleto 05/31/2017, 8:36 AM

## 2017-05-31 NOTE — Progress Notes (Signed)
Nutrition Follow-up  DOCUMENTATION CODES:   Not applicable  INTERVENTION:   -Continue 30 ml Prostat BID, each supplement provides 100 kcals and 15 grams protein -Downgrade diet to dysphagia 3 diet (advanced mechanical soft), for ease of intake  NUTRITION DIAGNOSIS:   Increased nutrient needs related to catabolic illness(End stage HF) as evidenced by estimated needs.  Ongoing  GOAL:   Patient will meet greater than or equal to 90% of their needs  Progressing  MONITOR:   PO intake, Supplement acceptance, Labs, Weight trends, I & O's  REASON FOR ASSESSMENT:   Consult Assessment of nutrition requirement/status  ASSESSMENT:   47 y/o make PMHx CHF, DM2, SAH, CKD2-3. Patient had been diagnosed with HF (ef 15%) end of last year. Presented to [facility] 4/6 w/ SOB and BLE edema x 2.5 days.  Worked up for Cardiogenic shock in setting of decompensated, worsening heart failure. He is not a transplant candidate and poor candidate for LVAD due to social situation & RV dysfunction. VAD team evaluating. Options are hospice vs high risk VAD. RD consulted for nutrition evaluation for potential LVAD.   4/26 Multiple teeth extractions 4/30 HeartMate III placed 5/1 Extubated 5/3- one chest tube removed 5/7- transferred from ICU to SDU  Reviewed I/O's: +209 ml x 24 hours and -9.5 L since admission  Wt remains stable.   Per RN, pt continues with great appetite; meal completion 80-100% and pt continues to take Prostat supplements.   Spoke with pt, who was sitting in recliner chair at time of visit. Pt is in great spirits, smiling and joking with this RD at time of visit. He reports continued good appetite- consuming "almost all" of his food off of his trays. He shares that he has difficult chewing tough meats due to lack of teeth; he is amenable diet downgrade. Also discussed softer textured protein containing foods other than meat, such as eggs, nut butters, puddings, ice cream, cottage cheese, and  yogurt.   Pt reports ambulating well in halls (he reports walking earlier this AM). He is eager to continue to exercise after discharge; pt able to teachback appropriate exercises per staff education. Discussed importance of good meal intake and importance of following a general, healthful diet. Pt with no further questions regarding nutrition, but expresses appreciation for visit.   Labs reviewed: CBGS: 97-158 (inpatient orders for glycemic control are 0-15 units insulin aspart TID with meals and 0-5 units insulin aspart q HS).   NUTRITION-FOCUSED PHYSICAL EXAM:   Most Recent Value  Orbital Region  Mild depletion  Upper Arm Region  No depletion  Thoracic and Lumbar Region  No depletion  Buccal Region  No depletion  Temple Region  Mild depletion  Clavicle Bone Region  No depletion  Clavicle and Acromion Bone Region  No depletion  Scapular Bone Region  No depletion  Dorsal Hand  No depletion  Patellar Region  No depletion  Anterior Thigh Region  No depletion  Posterior Calf Region  No depletion  Edema (RD Assessment)  None  Hair  Reviewed  Eyes  Reviewed  Mouth  Reviewed  Skin  Reviewed  Nails  Reviewed     Diet Order:   Diet Order           DIET DYS 3 Room service appropriate? Yes; Fluid consistency: Thin  Diet effective now          EDUCATION NEEDS:   Education needs have been addressed  Skin:  Skin Assessment: Skin Integrity Issues: Skin Integrity Issues::  Incisions Incisions: surgical: abdomen, chest  Last BM:  05/28/17  Height:   Ht Readings from Last 1 Encounters:  05/22/17 6' (1.829 m)    Weight:   Wt Readings from Last 1 Encounters:  05/31/17 198 lb 9.6 oz (90.1 kg)    Ideal Body Weight:  80.91 kg  BMI:  Body mass index is 26.94 kg/m.  Estimated Nutritional Needs:   Kcal:  2200-2400  Protein:  115-130 grams  Fluid:  2.2-2.4 L    Creston Klas A. Mayford Knife, RD, LDN, CDE Pager: [phone] After hours Pager: [phone]

## 2017-05-31 NOTE — Progress Notes (Addendum)
[facility]  Date of Admission:  04/21/2017   Total days of antibiotics 10        Day 4 Meropenem         Day 10 Vancomycin         Patient ID: Benjamin Sherman is a 47 y.o. 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 allopurinol  100 mg Oral Daily  . amiodarone  200 mg Oral Daily  . aspirin EC  81 mg Oral Daily  . bisacodyl  10 mg Oral Daily   Or  . bisacodyl  10 mg Rectal Daily  . chlorhexidine  15 mL Mouth/Throat BID  . clonazePAM  1 mg Oral QHS  . docusate sodium  200 mg Oral Daily  . feeding supplement (PRO-STAT SUGAR FREE 64)  30 mL Oral BID  . ferrous fumarate-b12-vitamic C-folic acid  1 capsule Oral BID  . furosemide  40 mg Oral Daily  . guaiFENesin  600 mg Oral BID  . insulin aspart  0-15 Units Subcutaneous TID WC  . insulin aspart  0-5 Units Subcutaneous QHS  . iopamidol  30 mL Oral Q1 Hr x 2  . iopamidol      . losartan  25 mg Oral Daily  . pantoprazole  40 mg Oral Daily  . potassium chloride  40 mEq Oral Daily  . sodium chloride flush  10-40 mL Intracatheter Q12H  . warfarin  7.5 mg Oral ONCE-1800  . Warfarin - Pharmacist Dosing Inpatient   Does not apply q1800    SUBJECTIVE: Doing well. No complaints.   Allergies  Allergen Reactions  . Bee Venom     UNSPECIFIED REACTION     OBJECTIVE: Vitals:   05/31/17 0747 05/31/17 0832 05/31/17  1128 05/31/17 1130  BP: 104/86   121/64  Pulse:  (!) 115    Resp:    (!) 30  Temp: 99.3 F (37.4 C)  99.3 F (37.4 C)   TempSrc: Oral  Oral   SpO2:      Weight:      Height:       Body mass index is 26.94 kg/m.  Physical Exam  Constitutional: He is oriented to person, place, and time and well-developed, well-nourished, and in no distress.  Seated comfortably in chair.   HENT:  Mouth/Throat: No oral lesions. Normal dentition. No dental caries.  Eyes: No scleral icterus.  Cardiovascular: Normal rate, regular rhythm and normal heart sounds.  LVAD hum heard with overlying native heart tones.   Pulmonary/Chest: Effort normal and breath sounds normal.  Abdominal: Soft. He exhibits no distension. There is no tenderness.  Dressing clean and dry to LVAD.   Lymphadenopathy:    He has no cervical adenopathy.  Neurological: He is alert and oriented to person, place, and time.  Skin: Skin is warm and dry. No rash  noted.  Psychiatric: Mood and affect normal.    Lab Results Lab Results  Component Value Date   WBC 8.2 05/31/2017   HGB 9.6 (L) 05/31/2017   HCT 29.6 (L) 05/31/2017   MCV 83.6 05/31/2017   PLT 532 (H) 05/31/2017    Lab Results  Component Value Date   CREATININE 1.18 05/31/2017   BUN 16 05/31/2017   NA 130 (L) 05/31/2017   K 4.1 05/31/2017   CL 94 (L) 05/31/2017   CO2 26 05/31/2017    Lab Results  Component Value Date   ALT 13 (L) 05/30/2017   AST 25 05/30/2017   ALKPHOS 175 (H) 05/30/2017   BILITOT 1.0 05/30/2017     Microbiology: Recent Results (from the past 240 hour(s))  Culture, blood (routine x 2)     Status: None   Collection Time: 05/22/17  9:30 PM  Result Value Ref Range Status   Specimen Description BLOOD LEFT ANTECUBITAL  Final   Special Requests   Final    BOTTLES DRAWN AEROBIC AND ANAEROBIC Blood Culture adequate volume   Culture   Final    NO GROWTH 5 DAYS Performed at [facility], [address] [place], [place] [address]     Report Status 05/27/2017 FINAL  Final  Culture, blood (routine x 2)     Status: None   Collection Time: 05/22/17  9:37 PM  Result Value Ref Range Status   Specimen Description BLOOD LEFT HAND  Final   Special Requests   Final    BOTTLES DRAWN AEROBIC AND ANAEROBIC Blood Culture adequate volume   Culture   Final    NO GROWTH 5 DAYS Performed at [facility], [address], [place], [place] [address]    Report Status 05/27/2017 FINAL  Final  Urine Culture     Status: None   Collection Time: 05/24/17  3:10 PM  Result Value Ref Range Status   Specimen Description URINE, CLEAN CATCH  Final   Special Requests Normal  Final   Culture   Final    NO GROWTH Performed at [facility], [address] [place] [address] [place], [place] [address]    Report Status 05/25/2017 FINAL  Final  Culture, blood (Routine X 2) w Reflex to ID Panel     Status: None (Preliminary result)   Collection Time: 05/28/17  6:16 AM  Result Value Ref Range Status   Specimen Description BLOOD RIGHT ANTECUBITAL  Final   Special Requests   Final    BOTTLES DRAWN AEROBIC ONLY Blood Culture adequate volume   Culture   Final    NO GROWTH 3 DAYS Performed at [facility], [address] [place], [place] [address]    Report Status PENDING  Incomplete  Culture, blood (Routine X 2) w Reflex to ID Panel     Status: None (Preliminary result)   Collection Time: 05/28/17  6:20 AM  Result Value Ref Range Status   Specimen Description BLOOD RIGHT ANTECUBITAL  Final   Special Requests   Final    BOTTLES DRAWN AEROBIC ONLY Blood Culture adequate volume   Culture   Final    NO GROWTH 3 DAYS Performed at [facility], [address], [place], [place] [address]    Report Status PENDING  Incomplete  Culture, blood (routine x 2)     Status: None (Preliminary result)   Collection Time: 05/28/17 11:00 PM  Result Value Ref Range Status   Specimen Description  BLOOD BLOOD RIGHT FOREARM  Final   Special Requests   Final     BOTTLES DRAWN AEROBIC ONLY Blood Culture adequate volume   Culture   Final    NO GROWTH 2 DAYS Performed at [facility], [address], [place], [place] [address]    Report Status PENDING  Incomplete  Culture, blood (routine x 2)     Status: None (Preliminary result)   Collection Time: 05/28/17 11:10 PM  Result Value Ref Range Status   Specimen Description BLOOD BLOOD RIGHT FOREARM  Final   Special Requests   Final    BOTTLES DRAWN AEROBIC ONLY Blood Culture adequate volume   Culture   Final    NO GROWTH 2 DAYS Performed at [facility], [address] [place], [place] [address]    Report Status PENDING  Incomplete    ASSESSMENT: Benjamin Sherman is a 47 y.o. male now experiencing significant fevers after recent implantation of HM3 LVAD on 05/15/17. No culture data has been revealing and it is uncertain as to the cause of his ongoing fevers. Would stop Vancomycin and shorten the course of Meropenem to 5 days and observe off therapy. In looking at fever patterns it appears that temperatures were more erratic during course of diflucan/meropenem therapy - not sure there is an association.    PLAN: 1. CT Chest/Abdomen/Pelvis being done today  2. Stop Vancomycin today  3. Stop Meropenem after tomorrow  Rexene Alberts, MSN, NP-C [facility] Pager: [phone]  05/31/2017  11:50 AM

## 2017-05-31 NOTE — Progress Notes (Signed)
CSW met with patient at bedside. Patient feeling down about lengthy hospitalization and states "I am ready to go home". Patient unsure of discharge and states he has another test later today and hopeful for positive results. Patient denies any other concerns at this time. CSW continues to follow for supportive needs. Raquel Sarna, [place], [place]

## 2017-05-31 NOTE — Progress Notes (Addendum)
ANTICOAGULATION CONSULT NOTE - Follow-Up Consult  Pharmacy Consult for Warfarin + Heparin Indication: LVAD  Allergies  Allergen Reactions  . Bee Venom     UNSPECIFIED REACTION     Patient Measurements: Height: 6' (182.9 cm) Weight: 198 lb 9.6 oz (90.1 kg) IBW/kg (Calculated) : 77.6 Heparin Dosing Weight: n/a  Vital Signs: Temp: 99.3 F (37.4 C) (05/16 0747) Temp Source: Oral (05/16 0747) BP: 104/86 (05/16 0747) Pulse Rate: 100 (05/16 0300)  Labs: Recent Labs    05/29/17 0842  05/29/17 2258 05/30/17 0246 05/31/17 0702  HGB 10.1*  --   --  9.5* 9.6*  HCT 31.1*  --   --  28.9* 29.6*  PLT 596*  --   --  523* 532*  LABPROT 19.1*  --   --  17.0* 17.4*  INR 1.62  --   --  1.39 1.44  HEPARINUNFRC  --    < > 0.43 0.42 0.42  CREATININE 1.18  --   --  1.34* 1.18   < > = values in this interval not displayed.    Estimated Creatinine Clearance: 84 mL/min (by C-G formula based on SCr of 1.18 mg/dL).   Medical History: Past Medical History:  Diagnosis Date  . CHF (congestive heart failure) (HCC)   . Diabetes mellitus without complication [facility])     Assessment: 47 yo male s/p LVAD placement 4/30 on warfarin per pharmacy. INR has been erratic this admit, appears patient will likely need close to 3-4mg  daily. INR subtherapeutic 5/14 so heparin bridge initiated. Heparin level stable and at goal, INR remains low but trending up slightly. Hgb, LDH stable.   Goal of Therapy:  INR goal 2-2.5  Heparin Level 0.3-0.5 Monitor platelets by anticoagulation protocol: Yes   Plan:  -Warfarin 7.5mg  PO x1 tonight - will boost again tonight in effort to stop heparin drip -Continue heparin 1450 units/hr -Daily INR, heparin level, CBC  [place] Highland, PharmD, BCPS PGY-2 Cardiology Pharmacy Resident Pager: [phone] 05/31/2017

## 2017-05-31 NOTE — Progress Notes (Signed)
Pt refused lab draw this am.  RN discussed importance of morning lab draws with pt, he stated he understood, but still refused lab draw.

## 2017-06-01 LAB — GLUCOSE, CAPILLARY
Glucose-Capillary: 70 mg/dL (ref 65–99)
Glucose-Capillary: 108 mg/dL — ABNORMAL HIGH (ref 65–99)
Glucose-Capillary: 102 mg/dL — ABNORMAL HIGH (ref 65–99)
Glucose-Capillary: 119 mg/dL — ABNORMAL HIGH (ref 65–99)

## 2017-06-01 LAB — PROTIME-INR
INR: 1.57
Prothrombin Time: 18.7 seconds — ABNORMAL HIGH (ref 11.4–15.2)

## 2017-06-01 LAB — LACTATE DEHYDROGENASE: LDH: 284 U/L — ABNORMAL HIGH (ref 98–192)

## 2017-06-01 LAB — BASIC METABOLIC PANEL
Anion gap: 8 (ref 5–15)
BUN: 17 mg/dL (ref 6–20)
CO2: 26 mmol/L (ref 22–32)
Calcium: 8.8 mg/dL — ABNORMAL LOW (ref 8.9–10.3)
Chloride: 97 mmol/L — ABNORMAL LOW (ref 101–111)
Creatinine, Ser: 1.32 mg/dL — ABNORMAL HIGH (ref 0.61–1.24)
GFR calc Af Amer: >60 mL/min (ref >60)
GFR calc non Af Amer: >60 mL/min (ref >60)
Glucose, Bld: 92 mg/dL (ref 65–99)
Potassium: 4 mmol/L (ref 3.5–5.1)
Sodium: 131 mmol/L — ABNORMAL LOW (ref 135–145)

## 2017-06-01 LAB — CBC
HCT: 27.9 % — ABNORMAL LOW (ref 39.0–52.0)
Hemoglobin: 8.9 g/dL — ABNORMAL LOW (ref 13.0–17.0)
MCH: 26.9 pg (ref 26.0–34.0)
MCHC: 31.9 g/dL (ref 30.0–36.0)
MCV: 84.3 fL (ref 78.0–100.0)
Platelets: 442 10*3/uL — ABNORMAL HIGH (ref 150–400)
RBC: 3.31 MIL/uL — ABNORMAL LOW (ref 4.22–5.81)
RDW: 15.9 % — ABNORMAL HIGH (ref 11.5–15.5)
WBC: 6.6 10*3/uL (ref 4.0–10.5)

## 2017-06-01 LAB — HEPARIN LEVEL (UNFRACTIONATED): Heparin Unfractionated: 0.36 IU/mL (ref 0.30–0.70)

## 2017-06-01 MED ORDER — WARFARIN SODIUM 7.5 MG PO TABS
7.5000 mg | ORAL_TABLET | Freq: Once | ORAL | Status: AC
Start: 2017-06-01 — End: 2017-06-01
  Administered 2017-06-01: 7.5 mg via ORAL
  Filled 2017-06-01: qty 1

## 2017-06-01 NOTE — Progress Notes (Addendum)
Patient ID: Benjamin Sherman, male   DOB: August 23, 1970, 47 y.o.   MRN: [id number]    Advanced Heart Failure Rounding Note  Subjective:    Events: -Admitted 04/21/17 with cardiogenic shock and atrial flutter.  -Central line placed. Initial co-ox 39%. -IABP placed 04/25/17-> 04/30/17 with persistent cardiogenic shock.  -DCCV atrial flutter 4/12.   -S/p multiple teeth extractions 05/11/17.  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  -Extubated on 5/1  - driveline trauma- from repositioning in bed. 5/9 - 5/10 Received 1uRBCs on 5/10. - 5/10 Ramp echo with speed turned down to 5300.  CT Chest 05/23/17 resolution of R pleural effusion. Small left pleural effusion with suggestion of small loculated component over the lateral left upper lobe. Bibasilar linear atelectasis.   CT chest abdomen/chest  05/31/2017  1. Postoperative changes. 2. Cardiomegaly and small pericardial effusion, decreased in size since the previous exam. 3. Small LEFT pleural effusion. 4. Bibasilar atelectasis.  No edema. 5. Hepatic steatosis. 6. Significant stool burden. 7. Normal appendix. 8. Degenerative changes at L5-S1.  Yesterday had temp 101.7.   Denies SOB. Feeling better. Wants go home. Finishes meropenem today. Repeat CT C/A/P no source of infection.    LVAD Interrogation HM 3: Speed: 5300 Flow: 4.2   PI: 3.4   Power: 4. No PI events. Personally reviewed     Objective:   Weight Range:  Vital Signs:   Temp:  [98.7 F (37.1 C)-101.7 F (38.7 C)] 99.3 F (37.4 C) (05/17 0745) Pulse Rate:  [79-88] 80 (05/17 0329) Resp:  [12-30] 22 (05/17 0512) BP: (86-121)/(62-96) 94/63 (05/17 0745) SpO2:  [95 %-100 %] 100 % (05/17 0329) Weight:  [198 lb 4.8 oz (89.9 kg)] 198 lb 4.8 oz (89.9 kg) (05/17 0700) Last BM Date: 05/31/17  Weight change: Filed Weights   05/30/17 0536 05/31/17 0641 06/01/17 0700  Weight: 195 lb 15.8 oz (88.9 kg) 198 lb 9.6 oz (90.1 kg) 198 lb 4.8 oz (89.9 kg)   Intake/Output:   Intake/Output Summary (Last 24 hours)  at 06/01/2017 0911 Last data filed at 06/01/2017 0745 Gross per 24 hour  Intake 874.5 ml  Output 1525 ml  Net -650.5 ml    Physical Exam    Physical Exam: GENERAL: NAD lying in bed.  HEENT: normal anicteric NECK: Supple, JVP 5-6 .  2+ bilaterally, no bruits.  No lymphadenopathy or thyromegaly appreciated.   CARDIAC:  Mechanical heart sounds with LVAD hum present.  LUNGS:  Clear to auscultation bilaterally. No wheeze ABDOMEN:  Soft, round, nontender, positive bowel sounds x4.     LVAD exit site: well-healed and incorporated.  Dressing dry and intact.  No erythema or drainage.  Stabilization device present and accurately applied.  Driveline dressing is being changed daily per sterile technique. Extremities: no cyanosis, clubbing, rash, edema Neuro: alert & oriented x 3, cranial nerves grossly intact. moves all 4 extremities w/o difficulty. Affect pleasant    Telemetry    NSR 80-90s Personally reviewed    EKG   No new tracings.    Labs    Basic Metabolic Panel: Recent Labs  Lab 05/28/17 0610 05/29/17 0842 05/30/17 0246 05/31/17 0702 06/01/17 0728  NA 132* 132* 129* 130* 131*  K 3.8 3.6 3.9 4.1 4.0  CL 96* 95* 95* 94* 97*  CO2 26 27 23 26 26   GLUCOSE 113* 113* 127* 118* 92  BUN 17 15 15 16 17   CREATININE 1.28* 1.18 1.34* 1.18 1.32*  CALCIUM 8.8* 8.9 8.4* 8.7* 8.8*   Liver Function Tests: Recent Labs  Lab 05/26/17 0425 05/27/17 0436 05/28/17 0610 05/29/17 0842 05/30/17 0246  AST 21 23 25 26 25   ALT 10* 12* 13* 13* 13*  ALKPHOS 177* 189* 187* 201* 175*  BILITOT 1.8* 1.2 1.6* 1.3* 1.0  PROT 7.6 7.7 8.2* 8.4* 7.8  ALBUMIN 2.1* 2.0* 2.3* 2.3* 2.1*   No results for input(s): LIPASE, AMYLASE in the last 168 hours. No results for input(s): AMMONIA in the last 168 hours.  CBC: Recent Labs  Lab 05/28/17 0610 05/29/17 0842 05/30/17 0246 05/31/17 0702 06/01/17 0728  WBC 11.7* 8.8 9.5 8.2 6.6  HGB 9.2* 10.1* 9.5* 9.6* 8.9*  HCT 28.3* 31.1* 28.9* 29.6*  27.9*  MCV 84.0 84.3 84.3 83.6 84.3  PLT 510* 596* 523* 532* 442*   Cardiac Enzymes: No results for input(s): CKTOTAL, CKMB, CKMBINDEX, TROPONINI in the last 168 hours. BNP: BNP (last 3 results) Recent Labs    04/21/17 1212 05/16/17 0529 05/22/17 0403  BNP 483.0* 262.3* 221.3*   ProBNP (last 3 results) No results for input(s): PROBNP in the last 8760 hours.  Other results:  Imaging: Ct Abdomen Pelvis Wo Contrast  Result Date: 05/31/2017 CLINICAL DATA:  Fever of unknown origin. EXAM: CT CHEST, ABDOMEN AND PELVIS WITHOUT CONTRAST TECHNIQUE: Multidetector CT imaging of the chest, abdomen and pelvis was performed following the standard protocol without IV contrast. COMPARISON:  Chest x-ray 05/26/2017 FINDINGS: CT CHEST FINDINGS Cardiovascular: LEFT ventricular assist device. The heart is enlarged. There is a small pericardial effusion, smaller compared to the previous exam. Small amount of air is identified within the pericardium, consistent with recent placement of LVAD. There is atherosclerotic calcification of the thoracic aorta not associated with aneurysm. Mediastinum/Nodes: The visualized portion of the thyroid gland has a normal appearance. Small mediastinal and hilar lymph nodes may be reactive. No axillary adenopathy. Lungs/Pleura: Small LEFT pleural effusion is present. There is bibasilar atelectasis. No focal consolidations or suspicious pulmonary nodules. No significant edema. Musculoskeletal: Median sternotomy.  No acute osseous abnormality. CT ABDOMEN PELVIS FINDINGS Hepatobiliary: The liver is diffusely low attenuation consistent with hepatic steatosis. No focal liver lesions are identified. The gallbladder is present. Pancreas: Unremarkable. No pancreatic ductal dilatation or surrounding inflammatory changes. Spleen: Normal in size without focal abnormality. Adrenals/Urinary Tract: Adrenal glands are normal in appearance. No hydronephrosis. No nephrolithiasis or ureteral stones.  Urinary bladder is unremarkable. Stomach/Bowel: The stomach and small bowel loops are normal in appearance. Significant stool burden. No colonic inflammation or abscess. Mesentery are unremarkable. The appendix is well seen and has a normal appearance. Vascular/Lymphatic: No significant vascular findings are present. No enlarged abdominal or pelvic lymph nodes. Reproductive: Prostate is unremarkable. Other: No abdominal wall hernia or abnormality. No abdominopelvic ascites. Musculoskeletal: Significant changes are identified at L5-S1. No suspicious lytic or blastic lesions are identified. IMPRESSION: 1. Postoperative changes. 2. Cardiomegaly and small pericardial effusion, decreased in size since the previous exam. 3. Small LEFT pleural effusion. 4. Bibasilar atelectasis.  No edema. 5. Hepatic steatosis. 6. Significant stool burden. 7. Normal appendix. 8. Degenerative changes at L5-S1. Electronically Signed   By: Norva Pavlov M.D.   On: 05/31/2017 17:34   Ct Chest Without Contrast  Result Date: 05/31/2017 CLINICAL DATA:  Fever of unknown origin. EXAM: CT CHEST, ABDOMEN AND PELVIS WITHOUT CONTRAST TECHNIQUE: Multidetector CT imaging of the chest, abdomen and pelvis was performed following the standard protocol without IV contrast. COMPARISON:  Chest x-ray 05/26/2017 FINDINGS: CT CHEST FINDINGS Cardiovascular: LEFT ventricular assist device. The heart is enlarged. There is a small  pericardial effusion, smaller compared to the previous exam. Small amount of air is identified within the pericardium, consistent with recent placement of LVAD. There is atherosclerotic calcification of the thoracic aorta not associated with aneurysm. Mediastinum/Nodes: The visualized portion of the thyroid gland has a normal appearance. Small mediastinal and hilar lymph nodes may be reactive. No axillary adenopathy. Lungs/Pleura: Small LEFT pleural effusion is present. There is bibasilar atelectasis. No focal consolidations or  suspicious pulmonary nodules. No significant edema. Musculoskeletal: Median sternotomy.  No acute osseous abnormality. CT ABDOMEN PELVIS FINDINGS Hepatobiliary: The liver is diffusely low attenuation consistent with hepatic steatosis. No focal liver lesions are identified. The gallbladder is present. Pancreas: Unremarkable. No pancreatic ductal dilatation or surrounding inflammatory changes. Spleen: Normal in size without focal abnormality. Adrenals/Urinary Tract: Adrenal glands are normal in appearance. No hydronephrosis. No nephrolithiasis or ureteral stones. Urinary bladder is unremarkable. Stomach/Bowel: The stomach and small bowel loops are normal in appearance. Significant stool burden. No colonic inflammation or abscess. Mesentery are unremarkable. The appendix is well seen and has a normal appearance. Vascular/Lymphatic: No significant vascular findings are present. No enlarged abdominal or pelvic lymph nodes. Reproductive: Prostate is unremarkable. Other: No abdominal wall hernia or abnormality. No abdominopelvic ascites. Musculoskeletal: Significant changes are identified at L5-S1. No suspicious lytic or blastic lesions are identified. IMPRESSION: 1. Postoperative changes. 2. Cardiomegaly and small pericardial effusion, decreased in size since the previous exam. 3. Small LEFT pleural effusion. 4. Bibasilar atelectasis.  No edema. 5. Hepatic steatosis. 6. Significant stool burden. 7. Normal appendix. 8. Degenerative changes at L5-S1. Electronically Signed   By: Norva Pavlov M.D.   On: 05/31/2017 17:34    Medications:    Scheduled Medications: . allopurinol  100 mg Oral Daily  . amiodarone  200 mg Oral Daily  . aspirin EC  81 mg Oral Daily  . bisacodyl  10 mg Oral Daily   Or  . bisacodyl  10 mg Rectal Daily  . chlorhexidine  15 mL Mouth/Throat BID  . clonazePAM  1 mg Oral QHS  . docusate sodium  200 mg Oral Daily  . feeding supplement (PRO-STAT SUGAR FREE 64)  30 mL Oral BID  . ferrous  fumarate-b12-vitamic C-folic acid  1 capsule Oral BID  . furosemide  40 mg Oral Daily  . guaiFENesin  600 mg Oral BID  . insulin aspart  0-15 Units Subcutaneous TID WC  . insulin aspart  0-5 Units Subcutaneous QHS  . losartan  25 mg Oral Daily  . pantoprazole  40 mg Oral Daily  . potassium chloride  40 mEq Oral Daily  . sodium chloride flush  10-40 mL Intracatheter Q12H  . Warfarin - Pharmacist Dosing Inpatient   Does not apply q1800    Infusions: . sodium chloride    . sodium chloride    . heparin 1,450 Units/hr (05/31/17 1900)  . lactated ringers Stopped (05/22/17 0926)  . meropenem (MERREM) IV 1 g (06/01/17 0511)    PRN Medications:    Assessment:   Kyros Salzwedel is a 47 y.o. 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 eosinophilic cardiomyopathy, LV thrombus, hypothyroidism, DM2, CKD Stage II-III and traumatic [facility] 11/18 after fall.   Admitted with cardiogenic shock.  Plan/Discussion:    1. 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  - Continue ASA 81 daily.  - Warfarin for goal INR 2-2.5  -INR 1.57 - On heparin. Continue coumadin.  -- Discussed dosing with PharmD personally. Will need  to bump dose  - Driveline was pulled while being repositioned in bed on 05/24/2017. Per VAD coordinator, velour is not exposed. Increased exudate noted. VAD coordinators following On zosyn.  - Speed adjusted to 5300 with ramp echo 5/10  2. Acute on chronic systolic HF -> cardiogenic shock:  Nonischemic cardiomyopathy. ECHO 11/2016 EF 25-30% CMRI EF 22%. Bedside echo in HF clinic EF 20% in 3/19.  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 Did not respond to steroids. Admitted 4/6 with cardiogenic shock and volume overload co-ox 38%. Echo 04/23/17: LVEF 20-25% with large LV clot, RV mild to moderately down.   -PICC out 5/12.  -Continue lasix 40 mg daily.  - Continue  losartan 25 mg daily.  - Renal function  3. Atrial flutter: New onset. S/p DCCV to  NSR on 4/12.  -Maintaining NSR  -Continue amio 200 mg daily   4. AKI CKD Stage II-III: Likely ATN due to shock.   - Stable.   -5. H/o LV Thrombus -   - resected with VAD placement - Continue heparin drip + coumadin. Stop heparin when INR > 1.8  - INR 1.57.  6. DMII - Per TCTS.  - No change to current plan.   7. Hypothyroidism:  - TFTs OK 04/22/17. TSH elevated at 10.848 4/23. T4 and T3 normal.  - No change to current plan.   8. RLE erythema/cellulitis:  9. ID - Covering with vanc, meropenum, and fluconazole. Tmax 101.8 again  - CT Chest 05/23/17 resolution of R pleural effusion. Small left pleural effusion with suggestion of small loculated component over the lateral left upper lobe. Bibasilar linear atelectasis.  - Blood CX 5/13 ---> cultures  negative - Completed 4 days diflucan.  - Completed meropenem 7 day . Stop date 5/19  - Day  10/10 Vancomycin.  - ID recommendations appreciated.  -CT jaw without infection per CT 10. Anemia-post-op blood loss - improved s/p 1u RBC 5/10 - Hgb trending down 9.6>8.9  CBC in am.   Tonye Becket, NP  06/01/2017 9:11 AM   Advanced Heart Failure Team Pager [phone] (M-F; 7a - 4p)  Please contact [facility] for night-coverage after hours (4p -7a ) and weekends on [email or web address]   Patient seen and examined with Tonye Becket, NP. We discussed all aspects of the encounter. I agree with the assessment and plan as stated above.   Continues with fevers to 101+. Bcx negative. CT scans negative. Finishes meropenem today.  ? Drug fever. INR 1.6 remains on heparin. No bleeding. Stop heparin when INR >= 1.8. Discussed dosing with PharmD personally. LDH ok. Hgb trending down slowly. Will follow. VAD interrogated personally. Parameters stable. Continue to ambulate. Possibly home Monday.   [name], MD  9:39 AM

## 2017-06-01 NOTE — Plan of Care (Signed)
Pt. Received all discharge paperwork and prescriptions. All belongings were with pt.on discharge.

## 2017-06-01 NOTE — Progress Notes (Signed)
Physical Therapy Treatment Patient Details Name: Benjamin Sherman MRN: [id number] DOB: 1970/11/28 Today's Date: 06/01/2017    History of Present Illness 47 yo admitted with cardiogenic shock, s/p RHC and IABP insertion with continued shock 4/29 , teeth extraction 4/26, HM111 4/30, extubated 5/1. PMHx: HF, cardiomyopathy, LV thrombus, hypothyroidism, DM, CKD, traumatic [facility] 11/2016    PT Comments    Patient seen for activity progression and education. Patient was able to transition between power sources x2 during session but required increased time and minimal cues to do so. Patient ambulated increased distance (outside to the benches) and tolerated well. Patient was pleasant and interactive throughout session. Current POC remains appropriate.   Follow Up Recommendations  Home health PT;Supervision/Assistance - 24 hour     Equipment Recommendations  Rolling walker with 5" wheels;3in1 (PT)    Recommendations for Other Services       Precautions / Restrictions Precautions Precautions: Sternal Precaution Booklet Issued: Yes (comment) Precaution Comments: LVAD  Restrictions Weight Bearing Restrictions: Yes Other Position/Activity Restrictions: sternal    Mobility  Bed Mobility Overal bed mobility: Needs Assistance Bed Mobility: Sit to Supine   Sidelying to sit: Supervision          Transfers Overall transfer level: Needs assistance Equipment used: None Transfers: Sit to/from Stand Sit to Stand: Min guard         General transfer comment: required 2 trials to stand from low bed, good hand placement and cues for increased anterior translation  Ambulation/Gait Ambulation/Gait assistance: Min guard;Supervision Ambulation Distance (Feet): 1000 Feet Assistive device: None Gait Pattern/deviations: Step-through pattern;Decreased stride length Gait velocity: decreased Gait velocity interpretation: 1.31 - 2.62 ft/sec, indicative of limited community ambulator General Gait  Details: ambulated without RW, some instability noted, multiple blance checks but able to self correct without physical assist   Stairs             Wheelchair Mobility    Modified Rankin (Stroke Patients Only)       Balance Overall balance assessment: Needs assistance Sitting-balance support: No upper extremity supported;Feet supported Sitting balance-Leahy Scale: Good     Standing balance support: No upper extremity supported Standing balance-Leahy Scale: Good                              Cognition Arousal/Alertness: Awake/alert Behavior During Therapy: WFL for tasks assessed/performed Overall Cognitive Status: Impaired/Different from baseline Area of Impairment: Problem solving;Memory                     Memory: Decreased short-term memory       Problem Solving: Slow processing General Comments: patient able to perform transition between power source with minmal cues today but significant time required. Was able to recall all components of black bag      Exercises      General Comments General comments (skin integrity, edema, etc.): ambulated outside      Pertinent Vitals/Pain Pain Assessment: No/denies pain    Home Living                      Prior Function            PT Goals (current goals can now be found in the care plan section) Acute Rehab PT Goals Patient Stated Goal: return to playing golf PT Goal Formulation: With patient/family Time For Goal Achievement: 06/01/17 Potential to Achieve Goals: Good Progress towards PT goals: Progressing toward  goals    Frequency    Min 3X/week      PT Plan Current plan remains appropriate    Co-evaluation              AM-PAC PT "6 Clicks" Daily Activity  Outcome Measure  Difficulty turning over in bed (including adjusting bedclothes, sheets and blankets)?: None Difficulty moving from lying on back to sitting on the side of the bed? : A Little Difficulty  sitting down on and standing up from a chair with arms (e.g., wheelchair, bedside commode, etc,.)?: A Little Help needed moving to and from a bed to chair (including a wheelchair)?: A Little Help needed walking in hospital room?: A Little Help needed climbing 3-5 steps with a railing? : A Little 6 Click Score: 19    End of Session Equipment Utilized During Treatment: Gait belt Activity Tolerance: Patient tolerated treatment well Patient left: in chair;with call bell/phone within reach Nurse Communication: Mobility status;Precautions PT Visit Diagnosis: Other abnormalities of gait and mobility (R26.89)     Time: 1610-9604 PT Time Calculation (min) (ACUTE ONLY): 38 min  Charges:  $Gait Training: 23-37 mins $Therapeutic Activity: 8-22 mins                    G Codes:       Charlotte Crumb, PT DPT  Board Certified Neurologic Specialist [phone]    Fabio Asa 06/01/2017, 11:26 AM

## 2017-06-01 NOTE — Care Management Note (Addendum)
Case Management Note Previous Note Created by Fernande Boyden RN, BSN Unit 4E-Case Manager-- 2H coverage [phone]  Patient Details  Name: Jagr Popp MRN: [id number] Date of Birth: 1970-10-13  Subjective/Objective:  Pt admitted with cardiogenic shock and atrial flutter now on IV milrinone and IV lasix gtt.                 Action/Plan: PTA pt lived at home- CM to follow for transition of care needs.   Expected Discharge Date:  04/24/17               Expected Discharge Plan:  Home w Home Health Services  In-House Referral:     Discharge planning Services  CM Consult  Post Acute Care Choice:    Choice offered to:  Patient  DME Arranged:  3-N-1, Walker rolling DME Agency:  [facility]  HH Arranged:    [facility] Agency:     Status of Service:  In process, will continue to follow  If discussed at Long Length of Stay Meetings, dates discussed:    Discharge Disposition:   Additional Comments: 06/01/2017 Pt is now off IV antibiotics.  Pt states his family/friend will provide recommended 24/7 supervision at discharge.  CM offered choice for [facility] - pt chose [facility].  CM requested orders from attending group.  CM informed [facility] of pending HH orders, equipment ordered and referral accepted by [facility].   05/22/17- 1420- Kristi Webster RN, CM- pt s/p VAD placement on 4/30- CM following for transition of care needs- per PT eval HH recommended- will need HH orders prior to discharge- and DME- 3n1 and RW-  [facility] can provide services for VAD patients.   Cherylann Parr, RN 06/01/2017, 2:49 PM

## 2017-06-01 NOTE — Progress Notes (Signed)
CARDIAC REHAB PHASE I   PRE:  Rate/Rhythm: 94 SR    BP: sitting 80 dopplared    SaO2:   MODE:  Ambulation: ? 600 ft   POST:  Rate/Rhythm: 110 ST    BP: sitting 80     SaO2:   Pt ambulated independently with stand by support. Sways at times but corrects himself. We walked outside to Heart and Vascular sitting area, which made him very happy. Sat and rested and talked for 20 min. Pt needed to rock to stand with mod assist (failed x2) from low bench. To recliner, VSS.  [phone]  Harriet Masson CES, ACSM 06/01/2017 2:39 PM

## 2017-06-01 NOTE — Progress Notes (Signed)
LVAD Coordinator Rounding Note:  Admitted 04/21/17 with cardiogenic shock and atrial flutter.   HM III LVAD implanted on 05/15/17 by Dr. Maren Beach  under Destination Therapy criteria due to limited social support.   Pt up in the chair eating breakfast.  Vital signs: T max: 101.7 HR: 92 Auto BP:  94/63 (72) Doppler: 72 O2 Sat:  100% on RA Wt: 195>211>212>208>205>207>200>217(inaccurate wt)>198>199>195>196>195>198 lbs   LVAD interrogation reveals:  Speed: 5300 Flow: 4.3 Power: 3.9w PI:  3.3 Alarms: none Events: none Hematocrit: 28 Fixed speed: 5300 Low speed limit: 5000  Drive line site care: Existing VAD dressing removed and site care performed using sterile technique. Drive line exit site cleaned with Chlora prep applicators x 2, allowed to dry, and gauze dressing with silver strip re-applied. Exit site with partial tissue ingrowth, one suture intact, the velour is fully implanted at exit site. Moderate amount of serous drainage,nofoul odor, tenderness, redness, or rash noted. Anchor intact. pt appeared to have a pocket of fluid distal to the driveline site. A moderate amount of clear serous fluid was expressed from this area. Tonye Becket, NP in to assess the site.        Labs:  LDH trend: 253>317>287>263>244>265>265>278>270>336>317>318>284  INR trend: 1.24>1.68>2.19>2.29>2.12>1.96>2.04>2.01>2.56>1.94>1.39>1.44>1.57   Anticoagulation Plan: -INR Goal: 2.0 - 2.5  -ASA Dose: 81 mg (started 05/18/17 with therapeutic INR) - Heparin gtt restarted for low INR at 1450 u/hr  Blood Products:   Intra op: 05/15/17 - 1 unit plts - 1 unit FFP  Post op:  - 05/15/17>1 RBC, 2 FFP, DDAVP, Factor VII -05/16/17>1 PC - 05/25/17 > 1 PC  Device: N/A  Arrythmias: increased ectopy, short run Afib 05/15/17 - Amiodarone started   Respiratory: extubated 05/16/17  Nitric Oxide: off 05/16/17  Gtts: Milrinone> stop 05/22/17 Levo>8 mcg/min-off 5/2 Epi>1 mcg/min-off 5/3 Amiodarone>30 mg/hr-off  5/2 Lasix 6 mg/hr-off 5/3 Heparin restarted 05/29/17 @ 1900 - 1450 u/hr  Infection: - BC drawn 05/22/17>NTD - Vanc and Zosyn started 05/22/17 for temp 101.3 - CT scan 5/8/9 to r/o infection - ID consulted 05/29/17- CT order of neck to r/o abcess from teeth extractions  Adverse Events on VAD: -none  VAD Education:  1. Education completed with Gene and pt on 05/28/17.  Plan/Recommendations:   1. Pt needs to complete Home VAD Logbook daily.  2. Discharge education complete. 3. Daily dressing changes per VAD coordinator, trained caregiver, or Nurse Alla Feeling. 4. Call VAD pager if any questions re: VAD equipment or drive line site/care.  [name] RN, VAD Coordinator 24/7 VAD pager: [phone]

## 2017-06-01 NOTE — Progress Notes (Signed)
Pt refused am labs, RN spoke with pt about importance of morning lab draws.  Pt verbalized understanding but still refused labs.

## 2017-06-01 NOTE — Progress Notes (Addendum)
[facility]  Date of Admission:  04/21/2017   Total days of antibiotics 11        Day 5 Meropenem                 Patient ID: Benjamin Sherman is a 47 y.o. 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 allopurinol  100 mg Oral Daily  . amiodarone  200 mg Oral Daily  . aspirin EC  81 mg Oral Daily  . bisacodyl  10 mg Oral Daily   Or  . bisacodyl  10 mg Rectal Daily  . chlorhexidine  15 mL Mouth/Throat BID  . clonazePAM  1 mg Oral QHS  . docusate sodium  200 mg Oral Daily  . feeding supplement (PRO-STAT SUGAR FREE 64)  30 mL Oral BID  . ferrous fumarate-b12-vitamic C-folic acid  1 capsule Oral BID  . furosemide  40 mg Oral Daily  . guaiFENesin  600 mg Oral BID  . insulin aspart  0-15 Units Subcutaneous TID WC  . insulin aspart  0-5 Units Subcutaneous QHS  . losartan  25 mg Oral Daily  . pantoprazole  40 mg Oral Daily  . potassium chloride  40 mEq Oral Daily  . sodium chloride flush  10-40 mL Intracatheter Q12H  . warfarin  7.5 mg Oral ONCE-1800  . Warfarin - Pharmacist Dosing Inpatient   Does not apply q1800    SUBJECTIVE: Doing well. No complaints.   Allergies  Allergen Reactions  . Bee Venom     UNSPECIFIED REACTION     OBJECTIVE: Vitals:   06/01/17 0512 06/01/17 0700 06/01/17 0745 06/01/17 1110  BP: 93/62  94/63 97/78  Pulse:    92  Resp: (!)  22   18  Temp:   99.3 F (37.4 C) 98.8 F (37.1 C)  TempSrc:   Oral Oral  SpO2:    100%  Weight:  198 lb 4.8 oz (89.9 kg)    Height:       Body mass index is 26.89 kg/m.  Physical Exam  Constitutional: He is oriented to person, place, and time and well-developed, well-nourished, and in no distress.  Seated comfortably in chair.   HENT:  Mouth/Throat: No oral lesions. Normal dentition. No dental caries.  Eyes: No scleral icterus.  Cardiovascular: Normal rate, regular rhythm and normal heart sounds.  LVAD hum heard with overlying native heart tones.   Pulmonary/Chest: Effort normal and breath sounds normal.  Abdominal: Soft. He exhibits no distension. There is no tenderness.  Dressing clean and dry to LVAD.   Lymphadenopathy:    He has no cervical adenopathy.  Neurological: He is alert and oriented to person, place, and time.  Skin: Skin is warm and dry. No rash noted.  Psychiatric: Mood and affect normal.    Lab Results Lab Results  Component Value  Date   WBC 6.6 06/01/2017   HGB 8.9 (L) 06/01/2017   HCT 27.9 (L) 06/01/2017   MCV 84.3 06/01/2017   PLT 442 (H) 06/01/2017    Lab Results  Component Value Date   CREATININE 1.32 (H) 06/01/2017   BUN 17 06/01/2017   NA 131 (L) 06/01/2017   K 4.0 06/01/2017   CL 97 (L) 06/01/2017   CO2 26 06/01/2017    Lab Results  Component Value Date   ALT 13 (L) 05/30/2017   AST 25 05/30/2017   ALKPHOS 175 (H) 05/30/2017   BILITOT 1.0 05/30/2017     Microbiology: Blood cultures negative 5/07, 5/12, 5/14 Urine culture 5/09 > negative  Imaging:  CT Chest 05/23/17 resolution of R pleural effusion. Small left pleural effusion with suggestion of small loculated component over the lateral left upper lobe. Bibasilar linear atelectasis.   CT chest abdomen/chest  05/31/2017  1. Postoperative changes. 2. Cardiomegaly and small pericardial effusion, decreased in size since the previous exam. 3. Small LEFT pleural effusion. 4.  Bibasilar atelectasis. No edema. 5. Hepatic steatosis. 6. Significant stool burden. 7. Normal appendix. 8. Degenerative changes at L5-S1.   ASSESSMENT: Curtez Sherman is a 47 y.o. male who began experiencing significant fevers/leukocytosis 6d after implantation of HM3 LVAD on 05/15/17. All culture data has remained negative as well as other work up thus far. Leukocytosis completely resolved. Not exactly clear as to the cause of his ongoing fevers but do suspect at least the later more erratic fevers to have been drug related and not due to infection. They do seem to be calming down with only one temperature 101.7 last PM.   Finishing meropenem today to complete 5 days and will observe fever curve over weekend. He looks great walking around in the hall today and seems ready for discharge home once we are more comfortable his fevers are resolved.   PLAN: 1. Last dose meropenem today  2. Observe off antibiotics over w/e  Dr. Ninetta Lights is available over the weekend for ID questions. Will see again on Monday in follow up.   Rexene Alberts, MSN, NP-C [facility] for Infectious Disease [facility] Pager: [phone]  06/01/2017  11:44 AM

## 2017-06-01 NOTE — Progress Notes (Signed)
ANTICOAGULATION CONSULT NOTE - Follow-Up Consult  Pharmacy Consult for Warfarin + Heparin Indication: LVAD  Allergies  Allergen Reactions  . Bee Venom     UNSPECIFIED REACTION     Patient Measurements: Height: 6' (182.9 cm) Weight: 198 lb 4.8 oz (89.9 kg) IBW/kg (Calculated) : 77.6 Heparin Dosing Weight: n/a  Vital Signs: Temp: 99.3 F (37.4 C) (05/17 0745) Temp Source: Oral (05/17 0745) BP: 94/63 (05/17 0745) Pulse Rate: 80 (05/17 0329)  Labs: Recent Labs    05/30/17 0246 05/31/17 0702 06/01/17 0728  HGB 9.5* 9.6* 8.9*  HCT 28.9* 29.6* 27.9*  PLT 523* 532* 442*  LABPROT 17.0* 17.4* 18.7*  INR 1.39 1.44 1.57  HEPARINUNFRC 0.42 0.42 0.36  CREATININE 1.34* 1.18 1.32*    Estimated Creatinine Clearance: 75.1 mL/min (A) (by C-G formula based on SCr of 1.32 mg/dL (H)).   Medical History: Past Medical History:  Diagnosis Date  . CHF (congestive heart failure) (HCC)   . Diabetes mellitus without complication [facility])     Assessment: 47 yo male s/p LVAD placement 4/30 on warfarin per pharmacy. INR has been erratic this admit, appears patient will likely need close to 3-4mg  daily.   INR subtherapeutic 5/14 so heparin bridge initiated. Heparin level stable and at goal, INR remains low but trending up slightly. Hgb dropped overnight to 8.9 but overall stable, LDH stable.  Goal of Therapy:  INR goal 2-2.5  Heparin Level 0.3-0.5 Monitor platelets by anticoagulation protocol: Yes   Plan:  -Warfarin 7.5mg  PO x1 tonight - will boost again tonight in effort to stop heparin drip -Continue heparin 1450 units/hr -Daily INR, heparin level, CBC  Sheppard Coil PharmD., BCPS Clinical Pharmacist 06/01/2017 9:13 AM

## 2017-06-02 DIAGNOSIS — I5022 Chronic systolic (congestive) heart failure: Secondary | ICD-10-CM

## 2017-06-02 DIAGNOSIS — Z01818 Encounter for other preprocedural examination: Secondary | ICD-10-CM

## 2017-06-02 LAB — BASIC METABOLIC PANEL
Anion gap: 9 (ref 5–15)
BUN: 17 mg/dL (ref 6–20)
CO2: 24 mmol/L (ref 22–32)
Calcium: 8.6 mg/dL — ABNORMAL LOW (ref 8.9–10.3)
Chloride: 100 mmol/L — ABNORMAL LOW (ref 101–111)
Creatinine, Ser: 1.22 mg/dL (ref 0.61–1.24)
GFR calc Af Amer: >60 mL/min (ref >60)
GFR calc non Af Amer: >60 mL/min (ref >60)
Glucose, Bld: 118 mg/dL — ABNORMAL HIGH (ref 65–99)
Potassium: 4.1 mmol/L (ref 3.5–5.1)
Sodium: 133 mmol/L — ABNORMAL LOW (ref 135–145)

## 2017-06-02 LAB — GLUCOSE, CAPILLARY
Glucose-Capillary: 112 mg/dL — ABNORMAL HIGH (ref 65–99)
Glucose-Capillary: 107 mg/dL — ABNORMAL HIGH (ref 65–99)
Glucose-Capillary: 67 mg/dL (ref 65–99)
Glucose-Capillary: 118 mg/dL — ABNORMAL HIGH (ref 65–99)
Glucose-Capillary: 96 mg/dL (ref 65–99)

## 2017-06-02 LAB — PROTIME-INR
INR: 1.77
Prothrombin Time: 20.5 seconds — ABNORMAL HIGH (ref 11.4–15.2)

## 2017-06-02 LAB — CULTURE, BLOOD (ROUTINE X 2)
Culture: NO GROWTH
Culture: NO GROWTH
Special Requests: ADEQUATE
Special Requests: ADEQUATE

## 2017-06-02 LAB — CBC
HCT: 26.1 % — ABNORMAL LOW (ref 39.0–52.0)
Hemoglobin: 8.4 g/dL — ABNORMAL LOW (ref 13.0–17.0)
MCH: 27.1 pg (ref 26.0–34.0)
MCHC: 32.2 g/dL (ref 30.0–36.0)
MCV: 84.2 fL (ref 78.0–100.0)
Platelets: 449 10*3/uL — ABNORMAL HIGH (ref 150–400)
RBC: 3.1 MIL/uL — ABNORMAL LOW (ref 4.22–5.81)
RDW: 15.9 % — ABNORMAL HIGH (ref 11.5–15.5)
WBC: 7.1 10*3/uL (ref 4.0–10.5)

## 2017-06-02 LAB — LACTATE DEHYDROGENASE: LDH: 277 U/L — ABNORMAL HIGH (ref 98–192)

## 2017-06-02 LAB — HEPARIN LEVEL (UNFRACTIONATED): Heparin Unfractionated: 0.37 IU/mL (ref 0.30–0.70)

## 2017-06-02 MED ORDER — WARFARIN SODIUM 5 MG PO TABS
5.0000 mg | ORAL_TABLET | Freq: Once | ORAL | Status: AC
  Administered 2017-06-02: 5 mg via ORAL
  Filled 2017-06-02: qty 1

## 2017-06-02 NOTE — Progress Notes (Signed)
Patient ID: Benjamin Sherman, male   DOB: June 19, 1970, 47 y.o.   MRN: [id number]    Advanced Heart Failure Rounding Note  Subjective:    Events: -Admitted 04/21/17 with cardiogenic shock and atrial flutter.  -Central line placed. Initial co-ox 39%. -IABP placed 04/25/17-> 04/30/17 with persistent cardiogenic shock.  -DCCV atrial flutter 4/12.   -S/p multiple teeth extractions 05/11/17.  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  -Extubated on 5/1  - driveline trauma- from repositioning in bed. 5/9 - 5/10 Received 1uRBCs on 5/10. - 5/10 Ramp echo with speed turned down to 5300.  CT Chest 05/23/17 resolution of R pleural effusion. Small left pleural effusion with suggestion of small loculated component over the lateral left upper lobe. Bibasilar linear atelectasis.   CT chest abdomen/chest  05/31/2017  1. Postoperative changes. 2. Cardiomegaly and small pericardial effusion, decreased in size since the previous exam. 3. Small LEFT pleural effusion. 4. Bibasilar atelectasis.  No edema. 5. Hepatic steatosis. 6. Significant stool burden. 7. Normal appendix. 8. Degenerative changes at L5-S1.  Temperature to 101.3 last night.  Cultures negative.  No dyspnea.  Currently off antibiotics.  INR 1.77 today.   LVAD Interrogation HM 3: Speed: 5300 Flow: 4.3  PI: 3.4 Power: 3.1. Personally reviewed   Objective:   Weight Range:  Vital Signs:   Temp:  [98.7 F (37.1 C)-101 F (38.3 C)] 98.7 F (37.1 C) (05/18 0423) Pulse Rate:  [92-99] 97 (05/18 0423) Resp:  [18-33] 25 (05/18 0423) BP: (70-99)/(48-85) 84/72 (05/18 0423) SpO2:  [97 %-100 %] 100 % (05/18 0423) Weight:  [198 lb 12.8 oz (90.2 kg)] 198 lb 12.8 oz (90.2 kg) (05/18 0423) Last BM Date: 05/31/17  Weight change: Filed Weights   05/31/17 0641 06/01/17 0700 06/02/17 0423  Weight: 198 lb 9.6 oz (90.1 kg) 198 lb 4.8 oz (89.9 kg) 198 lb 12.8 oz (90.2 kg)   Intake/Output:   Intake/Output Summary (Last 24 hours) at 06/02/2017 0720 Last data filed at 06/02/2017  4098 Gross per 24 hour  Intake 1982.43 ml  Output 1225 ml  Net 757.43 ml    Physical Exam    Physical Exam: GENERAL: Well appearing this am. NAD.  HEENT: Normal. NECK: Supple, JVP 7-8 cm. Carotids OK.  CARDIAC:  Mechanical heart sounds with LVAD hum present.  LUNGS:  CTAB, normal effort.  ABDOMEN:  NT, ND, no HSM. No bruits or masses. +BS  LVAD exit site: Well-healed and incorporated. Dressing dry and intact. No erythema or drainage. Stabilization device present and accurately applied. Driveline dressing changed daily per sterile technique. EXTREMITIES:  Warm and dry. No cyanosis, clubbing, rash, or edema.  NEUROLOGIC:  Alert & oriented x 3. Cranial nerves grossly intact. Moves all 4 extremities w/o difficulty. Affect pleasant    Telemetry    NSR 90s Personally reviewed  EKG   No new tracings.    Labs    Basic Metabolic Panel: Recent Labs  Lab 05/29/17 0842 05/30/17 0246 05/31/17 0702 06/01/17 0728 06/02/17 0525  NA 132* 129* 130* 131* 133*  K 3.6 3.9 4.1 4.0 4.1  CL 95* 95* 94* 97* 100*  CO2 27 23 26 26 24   GLUCOSE 113* 127* 118* 92 118*  BUN 15 15 16 17 17   CREATININE 1.18 1.34* 1.18 1.32* 1.22  CALCIUM 8.9 8.4* 8.7* 8.8* 8.6*   Liver Function Tests: Recent Labs  Lab 05/27/17 0436 05/28/17 0610 05/29/17 0842 05/30/17 0246  AST 23 25 26 25   ALT 12* 13* 13* 13*  ALKPHOS 189* 187* 201*  175*  BILITOT 1.2 1.6* 1.3* 1.0  PROT 7.7 8.2* 8.4* 7.8  ALBUMIN 2.0* 2.3* 2.3* 2.1*   No results for input(s): LIPASE, AMYLASE in the last 168 hours. No results for input(s): AMMONIA in the last 168 hours.  CBC: Recent Labs  Lab 05/29/17 0842 05/30/17 0246 05/31/17 0702 06/01/17 0728 06/02/17 0525  WBC 8.8 9.5 8.2 6.6 7.1  HGB 10.1* 9.5* 9.6* 8.9* 8.4*  HCT 31.1* 28.9* 29.6* 27.9* 26.1*  MCV 84.3 84.3 83.6 84.3 84.2  PLT 596* 523* 532* 442* 449*   Cardiac Enzymes: No results for input(s): CKTOTAL, CKMB, CKMBINDEX, TROPONINI in the last 168 hours. BNP: BNP  (last 3 results) Recent Labs    04/21/17 1212 05/16/17 0529 05/22/17 0403  BNP 483.0* 262.3* 221.3*   ProBNP (last 3 results) No results for input(s): PROBNP in the last 8760 hours.  Other results:  Imaging: Ct Abdomen Pelvis Wo Contrast  Result Date: 05/31/2017 CLINICAL DATA:  Fever of unknown origin. EXAM: CT CHEST, ABDOMEN AND PELVIS WITHOUT CONTRAST TECHNIQUE: Multidetector CT imaging of the chest, abdomen and pelvis was performed following the standard protocol without IV contrast. COMPARISON:  Chest x-ray 05/26/2017 FINDINGS: CT CHEST FINDINGS Cardiovascular: LEFT ventricular assist device. The heart is enlarged. There is a small pericardial effusion, smaller compared to the previous exam. Small amount of air is identified within the pericardium, consistent with recent placement of LVAD. There is atherosclerotic calcification of the thoracic aorta not associated with aneurysm. Mediastinum/Nodes: The visualized portion of the thyroid gland has a normal appearance. Small mediastinal and hilar lymph nodes may be reactive. No axillary adenopathy. Lungs/Pleura: Small LEFT pleural effusion is present. There is bibasilar atelectasis. No focal consolidations or suspicious pulmonary nodules. No significant edema. Musculoskeletal: Median sternotomy.  No acute osseous abnormality. CT ABDOMEN PELVIS FINDINGS Hepatobiliary: The liver is diffusely low attenuation consistent with hepatic steatosis. No focal liver lesions are identified. The gallbladder is present. Pancreas: Unremarkable. No pancreatic ductal dilatation or surrounding inflammatory changes. Spleen: Normal in size without focal abnormality. Adrenals/Urinary Tract: Adrenal glands are normal in appearance. No hydronephrosis. No nephrolithiasis or ureteral stones. Urinary bladder is unremarkable. Stomach/Bowel: The stomach and small bowel loops are normal in appearance. Significant stool burden. No colonic inflammation or abscess. Mesentery are  unremarkable. The appendix is well seen and has a normal appearance. Vascular/Lymphatic: No significant vascular findings are present. No enlarged abdominal or pelvic lymph nodes. Reproductive: Prostate is unremarkable. Other: No abdominal wall hernia or abnormality. No abdominopelvic ascites. Musculoskeletal: Significant changes are identified at L5-S1. No suspicious lytic or blastic lesions are identified. IMPRESSION: 1. Postoperative changes. 2. Cardiomegaly and small pericardial effusion, decreased in size since the previous exam. 3. Small LEFT pleural effusion. 4. Bibasilar atelectasis.  No edema. 5. Hepatic steatosis. 6. Significant stool burden. 7. Normal appendix. 8. Degenerative changes at L5-S1. Electronically Signed   By: Norva Pavlov M.D.   On: 05/31/2017 17:34   Ct Chest Without Contrast  Result Date: 05/31/2017 CLINICAL DATA:  Fever of unknown origin. EXAM: CT CHEST, ABDOMEN AND PELVIS WITHOUT CONTRAST TECHNIQUE: Multidetector CT imaging of the chest, abdomen and pelvis was performed following the standard protocol without IV contrast. COMPARISON:  Chest x-ray 05/26/2017 FINDINGS: CT CHEST FINDINGS Cardiovascular: LEFT ventricular assist device. The heart is enlarged. There is a small pericardial effusion, smaller compared to the previous exam. Small amount of air is identified within the pericardium, consistent with recent placement of LVAD. There is atherosclerotic calcification of the thoracic aorta not associated with  aneurysm. Mediastinum/Nodes: The visualized portion of the thyroid gland has a normal appearance. Small mediastinal and hilar lymph nodes may be reactive. No axillary adenopathy. Lungs/Pleura: Small LEFT pleural effusion is present. There is bibasilar atelectasis. No focal consolidations or suspicious pulmonary nodules. No significant edema. Musculoskeletal: Median sternotomy.  No acute osseous abnormality. CT ABDOMEN PELVIS FINDINGS Hepatobiliary: The liver is diffusely low  attenuation consistent with hepatic steatosis. No focal liver lesions are identified. The gallbladder is present. Pancreas: Unremarkable. No pancreatic ductal dilatation or surrounding inflammatory changes. Spleen: Normal in size without focal abnormality. Adrenals/Urinary Tract: Adrenal glands are normal in appearance. No hydronephrosis. No nephrolithiasis or ureteral stones. Urinary bladder is unremarkable. Stomach/Bowel: The stomach and small bowel loops are normal in appearance. Significant stool burden. No colonic inflammation or abscess. Mesentery are unremarkable. The appendix is well seen and has a normal appearance. Vascular/Lymphatic: No significant vascular findings are present. No enlarged abdominal or pelvic lymph nodes. Reproductive: Prostate is unremarkable. Other: No abdominal wall hernia or abnormality. No abdominopelvic ascites. Musculoskeletal: Significant changes are identified at L5-S1. No suspicious lytic or blastic lesions are identified. IMPRESSION: 1. Postoperative changes. 2. Cardiomegaly and small pericardial effusion, decreased in size since the previous exam. 3. Small LEFT pleural effusion. 4. Bibasilar atelectasis.  No edema. 5. Hepatic steatosis. 6. Significant stool burden. 7. Normal appendix. 8. Degenerative changes at L5-S1. Electronically Signed   By: Norva Pavlov M.D.   On: 05/31/2017 17:34    Medications:    Scheduled Medications: . allopurinol  100 mg Oral Daily  . amiodarone  200 mg Oral Daily  . aspirin EC  81 mg Oral Daily  . bisacodyl  10 mg Oral Daily   Or  . bisacodyl  10 mg Rectal Daily  . chlorhexidine  15 mL Mouth/Throat BID  . clonazePAM  1 mg Oral QHS  . docusate sodium  200 mg Oral Daily  . feeding supplement (PRO-STAT SUGAR FREE 64)  30 mL Oral BID  . ferrous fumarate-b12-vitamic C-folic acid  1 capsule Oral BID  . furosemide  40 mg Oral Daily  . guaiFENesin  600 mg Oral BID  . insulin aspart  0-15 Units Subcutaneous TID WC  . insulin aspart   0-5 Units Subcutaneous QHS  . losartan  25 mg Oral Daily  . pantoprazole  40 mg Oral Daily  . potassium chloride  40 mEq Oral Daily  . Warfarin - Pharmacist Dosing Inpatient   Does not apply q1800    Infusions: . sodium chloride    . sodium chloride    . heparin 1,450 Units/hr (06/02/17 0558)  . lactated ringers Stopped (05/22/17 0926)    PRN Medications:    Assessment:   Benjamin Sherman is a 47 y.o. 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 eosinophilic cardiomyopathy, LV thrombus, hypothyroidism, DM2, CKD Stage II-III and traumatic [facility] 11/18 after fall.   Admitted with cardiogenic shock.  Plan/Discussion:    1. 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 Speed adjusted to 5300 with ramp echo 5/10.  MAP 70s today.  - Continue ASA 81 daily.  - Warfarin for goal INR 2-2.5.  INR 1.77 today, will continue heparin gtt until INR > 1.8.  2. Acute on chronic systolic HF -> cardiogenic shock:  Nonischemic cardiomyopathy. ECHO 11/2016 EF 25-30% CMRI EF 22%. Bedside echo in HF clinic EF 20% in 3/19.  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 Did not respond to steroids. Admitted 4/6 with cardiogenic shock  and volume overload co-ox 38%. Echo 04/23/17: LVEF 20-25% with large LV clot, RV mild to moderately down. Renal function stable.  - Continue lasix 40 mg daily.  - Continue  losartan 25 mg daily.  3. Atrial flutter: New onset. S/p DCCV to NSR on 4/12. Maintaining NSR.  -Continue amio 200 mg daily   4. AKI CKD Stage II-III: Likely ATN due to shock.  Resolved.  5. H/o LV Thrombus: resected with VAD placement - Continue heparin drip + coumadin. Stop heparin when INR > 1.8   6. DMII - Per TCTS.  - No change to current plan.   7. Hypothyroidism: TFTs OK 04/22/17. TSH elevated at 10.848 4/23. T4 and T3 normal.  - No change to current plan.   8. RLE erythema/cellulitis:  9. ID: Persistent fevers. Covered with vanc, meropenum, and fluconazole  initially. CT Chest 05/23/17 resolution of R pleural effusion. Small left pleural effusion with suggestion of small loculated component over the lateral left upper lobe. Bibasilar linear atelectasis.  CT jaw without infection per CT.  ID following. Cultures negative, still with persistent fevers (101.3 last night).  Now observing off abx.  - Continue to follow off abx, no source for fever (?drug fever).  10. Anemia-post-op blood loss: Hgb 8.4 today, follow.   Patient feels very good, watch fever curve over weekend, ?home Monday.   Marca Ancona, MD  06/02/2017 7:20 AM   Advanced Heart Failure Team Pager [phone] (M-F; 7a - 4p)  Please contact [facility] for night-coverage after hours (4p -7a ) and weekends on [email or web address]

## 2017-06-02 NOTE — Plan of Care (Signed)
Continue with current care plan

## 2017-06-02 NOTE — Progress Notes (Signed)
ANTICOAGULATION CONSULT NOTE - Follow-Up Consult  Pharmacy Consult for Warfarin + Heparin Indication: LVAD  Allergies  Allergen Reactions  . Bee Venom     UNSPECIFIED REACTION     Patient Measurements: Height: 6' (182.9 cm) Weight: 198 lb 12.8 oz (90.2 kg) IBW/kg (Calculated) : 77.6 Heparin Dosing Weight: n/a  Vital Signs: Temp: 98.1 F (36.7 C) (05/18 0746) Temp Source: Oral (05/18 0746) BP: 72/48 (05/18 0746) Pulse Rate: 90 (05/18 0746)  Labs: Recent Labs    05/31/17 0702 06/01/17 0728 06/02/17 0525 06/02/17 0527  HGB 9.6* 8.9* 8.4*  --   HCT 29.6* 27.9* 26.1*  --   PLT 532* 442* 449*  --   LABPROT 17.4* 18.7* 20.5*  --   INR 1.44 1.57 1.77  --   HEPARINUNFRC 0.42 0.36  --  0.37  CREATININE 1.18 1.32* 1.22  --     Estimated Creatinine Clearance: 81.3 mL/min (by C-G formula based on SCr of 1.22 mg/dL).   Medical History: Past Medical History:  Diagnosis Date  . CHF (congestive heart failure) (HCC)   . Diabetes mellitus without complication [facility])     Assessment: 47 yo male s/p LVAD placement 4/30 on warfarin per pharmacy.  INR subtherapeutic 5/14 so heparin bridge initiated. Heparin level stable and at goal, INR remains low but trending up slightly. Hgb with slow tend down to 8.4, LDH stable. -INR= 1.77 (trend up)  Goal of Therapy:  INR goal 2-2.5  Heparin Level 0.3-0.5 Monitor platelets by anticoagulation protocol: Yes   Plan:  -Warfarin 5mg  PO x1 tonight - -Continue heparin 1450 units/hr -Daily INR, heparin level, CBC  Harland German, PharmD Clinical Pharmacist 06/02/2017 10:55 AM

## 2017-06-02 NOTE — Progress Notes (Signed)
CARDIAC REHAB PHASE I   Went to see pt for walk. Pt declined walk at this time because he was still eating.  Encouraged patient to continue walking with RNs. Educated pt about sternal precautions and its importance.  Discussed having materials for black bag and importance of ensuring system controller is secured before getting up.  Verbalized understanding.  RN made aware.  [phone]  Nikki Dom, RN 06/02/2017 2:23 PM

## 2017-06-03 LAB — BASIC METABOLIC PANEL
Anion gap: 9 (ref 5–15)
BUN: 17 mg/dL (ref 6–20)
CO2: 25 mmol/L (ref 22–32)
Calcium: 8.6 mg/dL — ABNORMAL LOW (ref 8.9–10.3)
Chloride: 98 mmol/L — ABNORMAL LOW (ref 101–111)
Creatinine, Ser: 1.13 mg/dL (ref 0.61–1.24)
GFR calc Af Amer: >60 mL/min (ref >60)
GFR calc non Af Amer: >60 mL/min (ref >60)
Glucose, Bld: 112 mg/dL — ABNORMAL HIGH (ref 65–99)
Potassium: 4.3 mmol/L (ref 3.5–5.1)
Sodium: 132 mmol/L — ABNORMAL LOW (ref 135–145)

## 2017-06-03 LAB — CBC
HCT: 25.7 % — ABNORMAL LOW (ref 39.0–52.0)
Hemoglobin: 8.1 g/dL — ABNORMAL LOW (ref 13.0–17.0)
MCH: 26.6 pg (ref 26.0–34.0)
MCHC: 31.5 g/dL (ref 30.0–36.0)
MCV: 84.3 fL (ref 78.0–100.0)
Platelets: 477 10*3/uL — ABNORMAL HIGH (ref 150–400)
RBC: 3.05 MIL/uL — ABNORMAL LOW (ref 4.22–5.81)
RDW: 15.9 % — ABNORMAL HIGH (ref 11.5–15.5)
WBC: 7.4 10*3/uL (ref 4.0–10.5)

## 2017-06-03 LAB — GLUCOSE, CAPILLARY
Glucose-Capillary: 116 mg/dL — ABNORMAL HIGH (ref 65–99)
Glucose-Capillary: 109 mg/dL — ABNORMAL HIGH (ref 65–99)
Glucose-Capillary: 124 mg/dL — ABNORMAL HIGH (ref 65–99)
Glucose-Capillary: 101 mg/dL — ABNORMAL HIGH (ref 65–99)

## 2017-06-03 LAB — CULTURE, BLOOD (ROUTINE X 2)
Culture: NO GROWTH
Culture: NO GROWTH
Special Requests: ADEQUATE
Special Requests: ADEQUATE

## 2017-06-03 LAB — PROTIME-INR
INR: 1.97
Prothrombin Time: 22.2 seconds — ABNORMAL HIGH (ref 11.4–15.2)

## 2017-06-03 LAB — LACTATE DEHYDROGENASE: LDH: 252 U/L — ABNORMAL HIGH (ref 98–192)

## 2017-06-03 LAB — HEPARIN LEVEL (UNFRACTIONATED): Heparin Unfractionated: 0.27 IU/mL — ABNORMAL LOW (ref 0.30–0.70)

## 2017-06-03 MED ORDER — WARFARIN SODIUM 3 MG PO TABS
6.0000 mg | ORAL_TABLET | Freq: Once | ORAL | Status: AC
  Administered 2017-06-03: 6 mg via ORAL
  Filled 2017-06-03: qty 2

## 2017-06-03 NOTE — Progress Notes (Signed)
Coordinated with 2H charge nurse to  send LVad champion from their unit  to come and do the daily dressing change , awaiting for someone to come.

## 2017-06-03 NOTE — Progress Notes (Signed)
Patient ID: Benjamin Sherman, male   DOB: 03-Aug-1970, 47 y.o.   MRN: [id number]    Advanced Heart Failure Rounding Note  Subjective:    Events: -Admitted 04/21/17 with cardiogenic shock and atrial flutter.  -Central line placed. Initial co-ox 39%. -IABP placed 04/25/17-> 04/30/17 with persistent cardiogenic shock.  -DCCV atrial flutter 4/12.   -S/p multiple teeth extractions 05/11/17.  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  -Extubated on 5/1  - driveline trauma- from repositioning in bed. 5/9 - 5/10 Received 1uRBCs on 5/10. - 5/10 Ramp echo with speed turned down to 5300.  CT Chest 05/23/17 resolution of R pleural effusion. Small left pleural effusion with suggestion of small loculated component over the lateral left upper lobe. Bibasilar linear atelectasis.   CT chest abdomen/chest  05/31/2017  1. Postoperative changes. 2. Cardiomegaly and small pericardial effusion, decreased in size since the previous exam. 3. Small LEFT pleural effusion. 4. Bibasilar atelectasis.  No edema. 5. Hepatic steatosis. 6. Significant stool burden. 7. Normal appendix. 8. Degenerative changes at L5-S1.  Afebrile over the last day.  Cultures negative.  No dyspnea.  Currently off antibiotics.  INR 1.97 today.   LVAD Interrogation HM 3: Speed: 5300 Flow: 4.1  PI: 4.1 Power: 3.8. No PI events.    Objective:   Weight Range:  Vital Signs:   Temp:  [98 F (36.7 C)-99.6 F (37.6 C)] 98.8 F (37.1 C) (05/19 0742) Pulse Rate:  [33-98] 98 (05/19 0742) Resp:  [18-31] 23 (05/19 0742) BP: (81-106)/(33-75) 92/71 (05/19 0432) SpO2:  [96 %-100 %] 98 % (05/19 0742) Weight:  [198 lb 10.2 oz (90.1 kg)] 198 lb 10.2 oz (90.1 kg) (05/19 0432) Last BM Date: 06/02/17  Weight change: Filed Weights   06/01/17 0700 06/02/17 0423 06/03/17 0432  Weight: 198 lb 4.8 oz (89.9 kg) 198 lb 12.8 oz (90.2 kg) 198 lb 10.2 oz (90.1 kg)   Intake/Output:   Intake/Output Summary (Last 24 hours) at 06/03/2017 0853 Last data filed at 06/03/2017 0800 Gross  per 24 hour  Intake 1945.96 ml  Output 2025 ml  Net -79.04 ml    Physical Exam    Physical Exam: GENERAL: Well appearing this am. NAD.  HEENT: Normal. NECK: Supple, JVP 7-8 cm. Carotids OK.  CARDIAC:  Mechanical heart sounds with LVAD hum present.  LUNGS:  CTAB, normal effort.  ABDOMEN:  NT, ND, no HSM. No bruits or masses. +BS  LVAD exit site: Well-healed and incorporated. Dressing dry and intact. No erythema or drainage. Stabilization device present and accurately applied. Driveline dressing changed daily per sterile technique. EXTREMITIES:  Warm and dry. No cyanosis, clubbing, rash, or edema.  NEUROLOGIC:  Alert & oriented x 3. Cranial nerves grossly intact. Moves all 4 extremities w/o difficulty. Affect pleasant    Telemetry    NSR 90s Personally reviewed  EKG   No new tracings.    Labs    Basic Metabolic Panel: Recent Labs  Lab 05/30/17 0246 05/31/17 0702 06/01/17 0728 06/02/17 0525 06/03/17 0701  NA 129* 130* 131* 133* 132*  K 3.9 4.1 4.0 4.1 4.3  CL 95* 94* 97* 100* 98*  CO2 23 26 26 24 25   GLUCOSE 127* 118* 92 118* 112*  BUN 15 16 17 17 17   CREATININE 1.34* 1.18 1.32* 1.22 1.13  CALCIUM 8.4* 8.7* 8.8* 8.6* 8.6*   Liver Function Tests: Recent Labs  Lab 05/28/17 0610 05/29/17 0842 05/30/17 0246  AST 25 26 25   ALT 13* 13* 13*  ALKPHOS 187* 201* 175*  BILITOT  1.6* 1.3* 1.0  PROT 8.2* 8.4* 7.8  ALBUMIN 2.3* 2.3* 2.1*   No results for input(s): LIPASE, AMYLASE in the last 168 hours. No results for input(s): AMMONIA in the last 168 hours.  CBC: Recent Labs  Lab 05/30/17 0246 05/31/17 0702 06/01/17 0728 06/02/17 0525 06/03/17 0701  WBC 9.5 8.2 6.6 7.1 7.4  HGB 9.5* 9.6* 8.9* 8.4* 8.1*  HCT 28.9* 29.6* 27.9* 26.1* 25.7*  MCV 84.3 83.6 84.3 84.2 84.3  PLT 523* 532* 442* 449* 477*   Cardiac Enzymes: No results for input(s): CKTOTAL, CKMB, CKMBINDEX, TROPONINI in the last 168 hours. BNP: BNP (last 3 results) Recent Labs    04/21/17 1212  05/16/17 0529 05/22/17 0403  BNP 483.0* 262.3* 221.3*   ProBNP (last 3 results) No results for input(s): PROBNP in the last 8760 hours.  Other results:  Imaging: No results found.  Medications:    Scheduled Medications: . allopurinol  100 mg Oral Daily  . amiodarone  200 mg Oral Daily  . aspirin EC  81 mg Oral Daily  . bisacodyl  10 mg Oral Daily   Or  . bisacodyl  10 mg Rectal Daily  . chlorhexidine  15 mL Mouth/Throat BID  . clonazePAM  1 mg Oral QHS  . docusate sodium  200 mg Oral Daily  . feeding supplement (PRO-STAT SUGAR FREE 64)  30 mL Oral BID  . ferrous fumarate-b12-vitamic C-folic acid  1 capsule Oral BID  . furosemide  40 mg Oral Daily  . guaiFENesin  600 mg Oral BID  . insulin aspart  0-15 Units Subcutaneous TID WC  . insulin aspart  0-5 Units Subcutaneous QHS  . losartan  25 mg Oral Daily  . pantoprazole  40 mg Oral Daily  . potassium chloride  40 mEq Oral Daily  . warfarin  6 mg Oral ONCE-1800  . Warfarin - Pharmacist Dosing Inpatient   Does not apply q1800    Infusions: . sodium chloride    . sodium chloride    . lactated ringers Stopped (05/22/17 0926)    PRN Medications:    Assessment:   Benjamin Sherman is a 47 y.o. 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 eosinophilic cardiomyopathy, LV thrombus, hypothyroidism, DM2, CKD Stage II-III and traumatic [facility] 11/18 after fall.   Admitted with cardiogenic shock.  Plan/Discussion:    1. 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 Speed adjusted to 5300 with ramp echo 5/10.  MAP 70s-80s today.  - Continue ASA 81 daily.  - Warfarin for goal INR 2-2.5.  INR 1.97 today, can stop heparin gtt.   2. Acute on chronic systolic HF -> cardiogenic shock:  Nonischemic cardiomyopathy. ECHO 11/2016 EF 25-30% CMRI EF 22%. Bedside echo in HF clinic EF 20% in 3/19.  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 Did not respond to steroids. Admitted 4/6 with cardiogenic shock  and volume overload co-ox 38%. Echo 04/23/17: LVEF 20-25% with large LV clot, RV mild to moderately down. Renal function stable.  - Continue lasix 40 mg daily.  - Continue  losartan 25 mg daily.  3. Atrial flutter: New onset. S/p DCCV to NSR on 4/12. Maintaining NSR.  - Continue amio 200 mg daily   4. AKI CKD Stage II-III: Likely ATN due to shock.  Resolved.  5. H/o LV Thrombus: resected with VAD placement - Continue warfarin.    6. DMII - Per TCTS.  - No change to current plan.   7. Hypothyroidism:  TFTs OK 04/22/17. TSH elevated at 10.848 4/23. T4 and T3 normal.  - No change to current plan.   8. RLE erythema/cellulitis:  9. ID: Persistent fevers. Covered with vanc, meropenum, and fluconazole initially. CT Chest 05/23/17 resolution of R pleural effusion. Small left pleural effusion with suggestion of small loculated component over the lateral left upper lobe. Bibasilar linear atelectasis.  CT jaw without infection per CT.  ID following. Cultures negative, has been afebrile for about 24 hrs now.  Now observing off abx.  - Continue to follow off abx, no source for fever (?drug fever).  10. Anemia-post-op blood loss: Hgb 8.1 today, follow.  No overt bleeding, transfuse hgb < 8.   Patient feels very good.  If he remains afebrile and hgb stable tomorrow, suspect he can go home.   Marca Ancona, MD  06/03/2017 8:53 AM   Advanced Heart Failure Team Pager [phone] (M-F; 7a - 4p)  Please contact [facility] for night-coverage after hours (4p -7a ) and weekends on [email or web address]

## 2017-06-03 NOTE — Progress Notes (Signed)
Pt refused blood draw. Pt was educated on why lab work was important but he still is refusing at this time.

## 2017-06-03 NOTE — Progress Notes (Signed)
ANTICOAGULATION CONSULT NOTE - Follow-Up Consult  Pharmacy Consult for Warfarin + Heparin Indication: LVAD  Allergies  Allergen Reactions  . Bee Venom     UNSPECIFIED REACTION     Patient Measurements: Height: 6' (182.9 cm) Weight: 198 lb 10.2 oz (90.1 kg) IBW/kg (Calculated) : 77.6 Heparin Dosing Weight: n/a  Vital Signs: Temp: 98.8 F (37.1 C) (05/19 0742) Temp Source: Oral (05/19 0742) BP: 92/71 (05/19 0432) Pulse Rate: 98 (05/19 0742)  Labs: Recent Labs    06/01/17 0728 06/02/17 0525 06/02/17 0527 06/03/17 0701  HGB 8.9* 8.4*  --  8.1*  HCT 27.9* 26.1*  --  25.7*  PLT 442* 449*  --  477*  LABPROT 18.7* 20.5*  --  22.2*  INR 1.57 1.77  --  1.97  HEPARINUNFRC 0.36  --  0.37 0.27*  CREATININE 1.32* 1.22  --  1.13    Estimated Creatinine Clearance: 87.7 mL/min (by C-G formula based on SCr of 1.13 mg/dL).   Medical History: Past Medical History:  Diagnosis Date  . CHF (congestive heart failure) (HCC)   . Diabetes mellitus without complication [facility])     Assessment: 47 yo male s/p LVAD placement 4/30 on warfarin per pharmacy.  INR subtherapeutic 5/14 so heparin bridge initiated. Heparin level stable and at goal, INR remains low but trending up slightly. Hgb with slow tend down to 8.1, LDH stable. -INR= 1.97 (trend up)  Goal of Therapy:  INR goal 2-2.5  Heparin Level 0.3-0.5 Monitor platelets by anticoagulation protocol: Yes   Plan:  -Warfarin 6 mg PO x1 tonight - -D/c IV Heparin since INR > 1.8. -Daily INR, CBC  Jenetta Downer, [facility] Clinical Pharmacist Pager [phone]  06/03/2017 8:36 AM

## 2017-06-04 ENCOUNTER — Telehealth (HOSPITAL_COMMUNITY): Payer: Self-pay | Admitting: *Deleted

## 2017-06-04 LAB — GLUCOSE, CAPILLARY
Glucose-Capillary: 116 mg/dL — ABNORMAL HIGH (ref 65–99)
Glucose-Capillary: 111 mg/dL — ABNORMAL HIGH (ref 65–99)

## 2017-06-04 LAB — BASIC METABOLIC PANEL
Anion gap: 10 (ref 5–15)
BUN: 15 mg/dL (ref 6–20)
CO2: 25 mmol/L (ref 22–32)
Calcium: 9 mg/dL (ref 8.9–10.3)
Chloride: 98 mmol/L — ABNORMAL LOW (ref 101–111)
Creatinine, Ser: 1.03 mg/dL (ref 0.61–1.24)
GFR calc Af Amer: >60 mL/min (ref >60)
GFR calc non Af Amer: >60 mL/min (ref >60)
Glucose, Bld: 113 mg/dL — ABNORMAL HIGH (ref 65–99)
Potassium: 4.3 mmol/L (ref 3.5–5.1)
Sodium: 133 mmol/L — ABNORMAL LOW (ref 135–145)

## 2017-06-04 LAB — PROTIME-INR
INR: 1.97
Prothrombin Time: 22.2 seconds — ABNORMAL HIGH (ref 11.4–15.2)

## 2017-06-04 LAB — CBC
HCT: 29 % — ABNORMAL LOW (ref 39.0–52.0)
Hemoglobin: 9.2 g/dL — ABNORMAL LOW (ref 13.0–17.0)
MCH: 27.1 pg (ref 26.0–34.0)
MCHC: 31.7 g/dL (ref 30.0–36.0)
MCV: 85.5 fL (ref 78.0–100.0)
Platelets: 459 10*3/uL — ABNORMAL HIGH (ref 150–400)
RBC: 3.39 MIL/uL — ABNORMAL LOW (ref 4.22–5.81)
RDW: 16 % — ABNORMAL HIGH (ref 11.5–15.5)
WBC: 8.3 10*3/uL (ref 4.0–10.5)

## 2017-06-04 LAB — LACTATE DEHYDROGENASE: LDH: 240 U/L — ABNORMAL HIGH (ref 98–192)

## 2017-06-04 MED ORDER — ASPIRIN 81 MG PO TBEC: 81.0000 mg | DELAYED_RELEASE_TABLET | Freq: Every day | ORAL | 6 refills | Status: DC

## 2017-06-04 MED ORDER — WARFARIN SODIUM 6 MG PO TABS: 6.0000 mg | ORAL_TABLET | Freq: Every day | ORAL | 6 refills | Status: DC

## 2017-06-04 MED ORDER — TRAMADOL HCL 50 MG PO TABS: 50.0000 mg | ORAL_TABLET | ORAL | 0 refills | Status: DC | PRN

## 2017-06-04 MED ORDER — DOCUSATE SODIUM 100 MG PO CAPS: 200.0000 mg | ORAL_CAPSULE | Freq: Every day | ORAL | 0 refills | Status: DC

## 2017-06-04 MED ORDER — BISACODYL 5 MG PO TBEC: 10.0000 mg | DELAYED_RELEASE_TABLET | Freq: Every day | ORAL | 0 refills | Status: DC

## 2017-06-04 MED ORDER — WARFARIN SODIUM 3 MG PO TABS
6.0000 mg | ORAL_TABLET | Freq: Once | ORAL | Status: DC
Start: 2017-06-04 — End: 2017-06-04

## 2017-06-04 MED ORDER — WARFARIN SODIUM 3 MG PO TABS
6.0000 mg | ORAL_TABLET | ORAL | Status: AC
  Administered 2017-06-04: 6 mg via ORAL
  Filled 2017-06-04: qty 2

## 2017-06-04 MED ORDER — PANTOPRAZOLE SODIUM 40 MG PO TBEC: 40.0000 mg | DELAYED_RELEASE_TABLET | Freq: Every day | ORAL | 6 refills | Status: DC

## 2017-06-04 MED ORDER — POTASSIUM CHLORIDE CRYS ER 20 MEQ PO TBCR: 40.0000 meq | EXTENDED_RELEASE_TABLET | Freq: Every day | ORAL | 6 refills | Status: DC

## 2017-06-04 MED ORDER — AMIODARONE HCL 200 MG PO TABS: 200.0000 mg | ORAL_TABLET | Freq: Every day | ORAL | 6 refills | Status: DC

## 2017-06-04 MED ORDER — ALLOPURINOL 100 MG PO TABS: 100.0000 mg | ORAL_TABLET | Freq: Every day | ORAL | 0 refills | Status: DC

## 2017-06-04 MED ORDER — FE FUMARATE-B12-VIT C-FA-IFC PO CAPS: 1.0000 | ORAL_CAPSULE | Freq: Two times a day (BID) | ORAL | 6 refills | Status: DC

## 2017-06-04 MED FILL — PANTOPRAZOLE SOD DR 40 MG T: 40 | 30 days supply | Qty: 30 | Fill #0 | Status: TO

## 2017-06-04 MED FILL — ASPIRIN ADULT LOW STRENGTH: 81 | 30 days supply | Qty: 30 | Fill #0

## 2017-06-04 MED FILL — POTASSIUM CL ER 20 MEQ TABL: 20 | 30 days supply | Qty: 60 | Fill #0

## 2017-06-04 MED FILL — WARFARIN SODIUM 6 MG TABLET: 6 | 30 days supply | Qty: 30 | Fill #0

## 2017-06-04 MED FILL — ALLOPURINOL 100 MG TABLET: 100 | 30 days supply | Qty: 30 | Fill #0 | Status: TO

## 2017-06-04 MED FILL — AMIODARONE HCL 200 MG TAB: 200 | 30 days supply | Qty: 30 | Fill #0 | Status: TO

## 2017-06-04 NOTE — Discharge Summary (Addendum)
Advanced Heart Failure Team  Discharge Summary   Patient ID: Benjamin Sherman MRN: [id number], DOB/AGE: 03/19/1970 47 y.o. Admit date: 04/21/2017 D/C date:     06/04/2017   Primary Discharge Diagnoses:  1. A/C systolic HF -> cardiogenic shock -> s/p HMIII 05/15/17 for destination therapy - On ASA 81 mg daily + coumadin. INR goal 2-2.5 - Speed: 5300  Secondary Discharge Diagnoses:  1. Atrial flutter, paroxysmal - S/p DCCV 04/27/17 - Continue amiodarone 200 mg daily 2. 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 History of LV thrombus - Resected with VAD placement 4. DMII 5. Hypothyroidism 6. RLE erythema/cellulitis 7.  Fevers with unknown source. - Treated with vanc, zosyn, meropenum, and fluconazole. 8. Anemia due to post op blood loss  Hospital Course: Benjamin Sherman is a 47 y.o. male with a history of systolic heart failure (diagnosed 10/2016), NICM, EF 20-25%, ?eosinphilic cardiomyopathy, LV thrombus, hypothyroidism, DM2, CKD stage 2-3, and traumatic [facility] 11/18 after fall.   He was admitted on 04/21/17 with cardiogenic shock and atrial flutter. He was started on milrinone and lasix gtt for initial co-ox of 39%. He was started on IV amiodarone and heparin for atrial flutter. Echo on 4/8 showed EF 20-25% with large LV clot so cardioversion was not attempted. Milrinone titrated up + addition of norepinephrine for ongoing cardiogenic shock. IABP and swan placed on 04/25/17. He had persistent cardiogenic shock, so he underwent successful urgent DCCV on 4/12. He maintained NSR and was transitioned to PO amiodarone. He was initially thought to be a poor LVAD candidate with lack of social support and RV failure. IABP was weaned and removed, along with swan, on 4/15.  Milrinone wean attempted several times with persistent drops in co-ox. Repeat echo showed EF 20-25% and RV moderately down. Due to persistent cardiogenic shock, VAD team reevaluated. Extensive VAD workup completed, including palliative care, social  work, and CT surgery. He had multiple teeth extractions on 4/25. After VAD evaluation completed, he was discussed at VAD MRB and was deemed appropriate for VAD implant. Dr Gala Romney discussed with [facility] transplant team and he was deemed appropriate for VAD for destination therapy.   IABP and swan reinserted on 05/14/17 for optimization prior to surgery. He underwent HM III implant for destination therapy on 05/15/17. Extubated on 5/1. All pressors gradually weaned off as he improved. He had driveline trauma from repositioning in bed on 5/9. Velour was not exposed. Started a course of Vanc and Zosyn for fever on 05/22/17.   Postop course complicated by persistent fevers and leukocytosis due to unknown source and and subtherapeutic INR. Blood cultures were negative. CT of chest, abdomen, and jaw all negative for infection.  ID was consulted. Completed antibiotic course with vanc, zosyn, diflucan, and meropenum. He remained afebrile off antibiotics x 48 hours prior to discharge.   He had AKI thought to be ATN related to shock, which has resolved He was also treated for RLE cellulitis with doxycycline.  He had pump off and low flow alarm on 5/18. No disconnection alarm. Data sent to [facility].   RAMP echo completed 5/10 with speed optimization to 5300. VAD discharge education was completed with pt's caregiver, Gene, prior to discharge.   He will be discharged to home with his cousin. [facility] with follow him outpatient. He has close follow up with LVAD clinic as below. We will arrange transport to clinic for him. He will need a fingerstick INR on Friday, 5/24 (through [facility], orders placed). Plan to repeat CBC next  week. Dr Gala Romney has assessed him and he is being discharged in stable condition. He is being provided medications from outpatient pharmacy.   LVAD Interrogation HM III:   Speed: 5350    Flow: 4.4     PI: 3.2     Power: 3.8      Discharge Weight: 196 lbs Discharge Vitals: Blood pressure  93/80, pulse 97, temperature 98.9 F (37.2 C), temperature source Oral, resp. rate (!) 23, height 6' (1.829 m), weight 196 lb 6.9 oz (89.1 kg), SpO2 98 %.  Labs: Lab Results  Component Value Date   WBC 8.3 06/04/2017   HGB 9.2 (L) 06/04/2017   HCT 29.0 (L) 06/04/2017   MCV 85.5 06/04/2017   PLT 459 (H) 06/04/2017    Recent Labs  Lab 05/30/17 0246  06/04/17 0919  NA 129*   < > 133*  K 3.9   < > 4.3  CL 95*   < > 98*  CO2 23   < > 25  BUN 15   < > 15  CREATININE 1.34*   < > 1.03  CALCIUM 8.4*   < > 9.0  PROT 7.8  --   --   BILITOT 1.0  --   --   ALKPHOS 175*  --   --   ALT 13*  --   --   AST 25  --   --   GLUCOSE 127*   < > 113*   < > = values in this interval not displayed.   Lab Results  Component Value Date   CHOL 156 05/04/2017   HDL 64 05/04/2017   LDLCALC 79 05/04/2017   TRIG 67 05/04/2017   BNP (last 3 results) Recent Labs    04/21/17 1212 05/16/17 0529 05/22/17 0403  BNP 483.0* 262.3* 221.3*    ProBNP (last 3 results) No results for input(s): PROBNP in the last 8760 hours.   Diagnostic Studies/Procedures   No results found.  Discharge Medications   Allergies as of 06/04/2017      Reactions   Bee Venom    UNSPECIFIED REACTION       Medication List    STOP taking these medications   digoxin 0.125 MG tablet Commonly known as:  LANOXIN   spironolactone 25 MG tablet Commonly known as:  ALDACTONE     TAKE these medications   allopurinol 100 MG tablet Commonly known as:  ZYLOPRIM Take 1 tablet (100 mg total) by mouth daily. Start taking on:  06/05/2017   amiodarone 200 MG tablet Commonly known as:  PACERONE Take 1 tablet (200 mg total) by mouth daily. Start taking on:  06/05/2017   aspirin 81 MG EC tablet Take 1 tablet (81 mg total) by mouth daily. Start taking on:  06/05/2017   bisacodyl 5 MG EC tablet Commonly known as:  DULCOLAX Take 2 tablets (10 mg total) by mouth daily. Start taking on:  06/05/2017   docusate sodium 100 MG  capsule Commonly known as:  COLACE Take 2 capsules (200 mg total) by mouth daily. Start taking on:  06/05/2017   ferrous fumarate-b12-vitamic C-folic acid capsule Commonly known as:  TRINSICON / FOLTRIN Take 1 capsule by mouth 2 (two) times daily.   furosemide 40 MG tablet Commonly known as:  LASIX Take 1 tablet (40 mg total) by mouth daily.   losartan 25 MG tablet Commonly known as:  COZAAR Take 1 tablet (25 mg total) by mouth at bedtime.   pantoprazole 40 MG tablet Commonly known  as:  PROTONIX Take 1 tablet (40 mg total) by mouth daily. Start taking on:  06/05/2017   potassium chloride SA 20 MEQ tablet Commonly known as:  K-DUR,KLOR-CON Take 2 tablets (40 mEq total) by mouth daily. Start taking on:  06/05/2017   traMADol 50 MG tablet Commonly known as:  ULTRAM Take 1 tablet (50 mg total) by mouth every 4 (four) hours as needed for moderate pain.   warfarin 6 MG tablet Commonly known as:  COUMADIN Take 1 tablet (6 mg total) by mouth daily.            Durable Medical Equipment  (From admission, onward)        Start     Ordered   06/04/17 0949  Heart failure home health orders  (Heart failure home health orders / Face to face)  Once    Comments:  Heart Failure Follow-up Care:  Verify follow-up appointments per Patient Discharge Instructions. Confirm transportation arranged. Reconcile home medications with discharge medication list. Remove discontinued medications from use. Assist patient/caregiver to manage medications using pill box. Reinforce low sodium food selection Assessments: Vital signs and oxygen saturation at each visit. Assess home environment for safety concerns, caregiver support and availability of low-sodium foods. Consult Child psychotherapist, PT/OT, Dietitian, and CNA based on assessments. Perform comprehensive cardiopulmonary assessment. Notify MD for any change in condition or weight gain of 3 pounds in one day or 5 pounds in one week with  symptoms. Daily Weights and Symptom Monitoring: Ensure patient has access to scales. Teach patient/caregiver to weigh daily before breakfast and after voiding using same scale and record.    Teach patient/caregiver to track weight and symptoms and when to notify Provider. Activity: Develop individualized activity plan with patient/caregiver.  Needs PT/INR finger stick on Friday 06/08/17. Page VAD pager [phone] with results.  Question Answer Comment  Heart Failure Follow-up Care [facility] at [phone]   Obtain the following labs Other see comments   Lab frequency Other see comments   Fax lab results to AHF Clinic at [phone]   Diet Low Sodium Heart Healthy   Fluid restrictions: 2000 mL Fluid   Skilled Nurse to notify MD of weight trends weekly for first 2 weeks. May fax or call: AHF Clinic at [phone] (fax) or [phone]   Initiate Heart Failure Clinic Diuretic Protocol to be used by [facility] only ( to be ordered by Heart Failure Team Providers Only) Yes      06/04/17 0950   06/01/17 1447  For home use only DME 3 n 1  Once     06/01/17 1446   06/01/17 1446  For home use only DME Walker rolling  Once    Question:  Patient needs a walker to treat with the following condition  Answer:  Mobility impaired   06/01/17 1446      Disposition   The patient will be discharged in stable condition to home. Discharge Instructions    (HEART FAILURE PATIENTS) Call MD:  Anytime you have any of the following symptoms: 1) 3 pound weight gain in 24 hours or 5 pounds in 1 week 2) shortness of breath, with or without a dry hacking cough 3) swelling in the hands, feet or stomach 4) if you have to sleep on extra pillows at night in order to breathe.   Complete by:  As directed    Amb Referral to Cardiac Rehabilitation   Complete by:  As directed    Diagnosis:  Heart Failure (see criteria below if ordering Phase II)   Heart Failure Type:  Chronic  Systolic Comment - LVAD   Call MD for:  temperature >100.4   Complete by:  As directed    Diet - low sodium heart healthy   Complete by:  As directed    Heart Failure patients record your daily weight using the same scale at the same time of day   Complete by:  As directed    INR  Goal: 2 - 2.5   Complete by:  As directed    Goal:  2 - 2.5   Increase activity slowly   Complete by:  As directed    Page VAD Coordinator at [phone]  Notify for: any VAD alarms, sustained elevations of power >10 watts, sustained drop in Pulse Index <3   Complete by:  As directed    Notify for:   any VAD alarms sustained elevations of power >10 watts sustained drop in Pulse Index <3     STOP any activity that causes chest pain, shortness of breath, dizziness, sweating, or exessive weakness   Complete by:  As directed    Speed Settings:   Complete by:  As directed    Fixed 5300 RPM Low 5000 RPM     Follow-up Information    Call Charlynne Pander, DDS.   Specialty:  Dentistry Why:  For wound re-check once discharged from the anticipated LVAD procedure Contact information: [address] [place] [address] [phone]        [facility] Follow up on 06/12/2017.   Specialty:  Cardiology Why:  VAD clinic at 12:00. Use parking deck under the hospital off of [address].  Parking code: [employer] information: [address] [id number] mc [place] [address] [phone]            Duration of Discharge Encounter: Greater than 35 minutes   Signed, Alford Highland, NP  06/04/2017, 1:14 PM   Patient seen and examined with the above-signed Advanced Practice Provider and/or Housestaff. I personally reviewed laboratory data, imaging studies and relevant notes. I independently examined the patient and formulated the important aspects of the plan. I have edited the note to reflect any of my changes or salient points. I have  personally discussed the plan with the patient and/or family.  Remains frustrated. Eager to go home. Denies SOB, orthopnea or PND. Refused labs this am but then acquiesced. INR 1.97. VAD interrogated personally. Parameters stable. There was a brief pump stop on 06/02/17. This was reviewed with [employer] engineers and confirmed that the pump stop button was pressed on power unit. Discussed with nursing leadership on 2C.   Now afebrile. Cultures negative. OK for d/c home today with close f/u.   [name], MD  1:41 PM

## 2017-06-04 NOTE — Telephone Encounter (Signed)
VAD Coordinator went to patient's room to deliver first month of home medications (excluding pain meds). Pt had left the hospital with friend. Tried reaching pt and caregiver - no answer.  Contacted pt via cell phone - he was "just pulling into [place]". Asked pt why he didn't wait until I delivered meds (as Annice Pih, Child psychotherapist had instructed him to do); replied "I thought you were sending to my home". Pt has had warfarin dose today, he will ask his friend to bring him to HF clinic tomorrow to pick up meds. Asked him to call VAD pager in am with confirmed plan.

## 2017-06-04 NOTE — Progress Notes (Signed)
Pt refused lab draw this AM x2. He want labs to be drawn at 8A. Advised labs to come back later.

## 2017-06-04 NOTE — Progress Notes (Signed)
LVAD Coordinator Rounding Note:  Admitted 04/21/17 with cardiogenic shock and atrial flutter.   HM III LVAD implanted on 05/15/17 by Dr. Darcey Nora  under Destination Therapy criteria due to limited social support.   Pt sitting on side of bed, denies complaints. Asked about transportation if he is discharged today, says "Benjamin Sherman or my sister can pick me up".   Vital signs: T max: 98.6 HR: 90 Auto BP:  91/77 (84) - correlates with doppler MAP  Doppler:  80 O2 Sat:  98 % on RA Wt: 198>199>195>196>195>198>198>198>196 lbs   LVAD interrogation reveals:  Speed: 5300 Flow: 4.1 Power: 3.8w PI:  4.4 Alarms: none Events: none Hematocrit: 26 Fixed speed: 5300 Low speed limit: 5000  Drive line site care: Weekly dressing changes per VAD coordinator, nurse champion, or trained caregiver using daily dressing kit with silver strip. Left abd dressing dry and intact, anchor on and accurately applied.    Labs:  LDH trend: 244>265>265>278>270>336>317>318>284>277>252>240  INR trend: 2.12>1.96>2.04>2.01>2.56>1.94>1.39>1.44>1.57>1.77>1.97>1.97   Anticoagulation Plan: -INR Goal: 2.0 - 2.5  -ASA Dose: 81 mg (started 05/18/17 with therapeutic INR) - Heparin gtt restarted for low INR at 1450 u/hr  Blood Products:   Intra op: 05/15/17 - 1 unit plts - 1 unit FFP  Post op:  - 05/15/17>1 RBC, 2 FFP, DDAVP, Factor VII -05/16/17>1 PC - 05/25/17 > 1 PC  Device: N/A  Arrythmias: increased ectopy, short run Afib 05/15/17 - Amiodarone started   Respiratory: extubated 05/16/17  Nitric Oxide: off 05/16/17  Gtts: Milrinone> stop 05/22/17 Levo>8 mcg/min-off 5/2 Epi>1 mcg/min-off 5/3 Amiodarone>30 mg/hr-off 5/2 Lasix 6 mg/hr-off 5/3 Heparin restarted 05/29/17 @ 1900 - 1450 u/hr  Infection: - BC drawn 05/22/17>NTD - Vanc and Zosyn started 05/22/17 for temp 101.3 - CT scan 5/8/9 to r/o infection - ID consulted 05/29/17- CT order of neck to r/o abcess from teeth extractions  Adverse Events on VAD: -none  VAD  Education:  1. Education completed with Benjamin Sherman and pt on 05/28/17.  Plan/Recommendations:   1. Poss discharge today; will deliver home equipment plus dressing supplies.  2. Discharge education complete. 3. Daily dressing changes per VAD coordinator, trained caregiver, or Nurse Davonna Belling. 4. Call VAD pager if any questions re: VAD equipment or drive line site/care.  Zada Girt RN, VAD Coordinator 24/7 VAD pager: [phone]

## 2017-06-04 NOTE — Progress Notes (Signed)
[phone] Pt walked with PT. He has rolling walker for home use. Gave ex ed for walking and encouraged IS. Encouraged low sodium diet and discussed CRP 2. Referring to [facility] program. Luetta Nutting RN BSN 06/04/2017 11:56 AM

## 2017-06-04 NOTE — Progress Notes (Signed)
Event log sent to [employer] for review. Per Delynn Flavin, Sr. Engineer:   Summary of the Heart Mate log file for your review:  The pump stop captured 06/02/2017 was due to the pump stop button being activated at the system monitor.  There were no other unusual events seen within the log file.   The MCS equipment is operating as intended. Pump parameters trending can be viewed in the graph below.  Dr. Gala Romney updated.  Patient c/o pain at exit site after walking with PT. Pt denies drive line trauma, "just started hurting".  Existing VAD dressing removed and site care performed using sterile technique. Drive line exit site cleaned with Chlora prep applicators x 2, allowed to dry, and gauze dressing with silver strip re-applied. Exit site with partial tissue ingrowth, the velour is fully implanted at exit site. Suture removed, no erythema, does c/o "burning" with cleansing, small amount tan drainage with no foul odor. Hematoma below exit site improved from last Friday, [name], VAD Coordinator in to compare; unable to express any drainage. Dr. Maren Beach updated. Drive line anchor re-applied. Stressed importance of keeping anchor on and DL secured in same, pt verbalized understanding of same.  Chest tube sutures removed as well.       Home discharge equipment delivered to room and includes:   -  Two system controllers  -  One mobile power unit with 20' patient cable  -  One universal Magazine features editor ([employer])  -  Eight fully charged batteries  -  Four battery clips  -  One travel case  -  One Oncologist  Following notification process completed with:  -  [name]  -  [facility]   Discussed frequency and importance of INR checks; emphasized importance of maintaining INR goal to prevent clotting and or bleeding issues with pump.  Patient will have INR managed by [facility] VAD team; current INR goal is 2.0 - 2.25. Stressed importance of never missing coumadin dose.    Pt says either Gene or his sister, [name], will pick him up today.   [name] RN, VAD Coordinator 24/7 VAD Pager: [phone]

## 2017-06-04 NOTE — Progress Notes (Signed)
Physical Therapy Treatment Patient Details Name: Benjamin Sherman MRN: [id number] DOB: January 21, 1970 Today's Date: 06/04/2017    History of Present Illness 47 yo admitted with cardiogenic shock, s/p RHC and IABP insertion with continued shock 4/29 , teeth extraction 4/26, HM111 4/30, extubated 5/1. PMHx: HF, cardiomyopathy, LV thrombus, hypothyroidism, DM, CKD, traumatic [facility] 11/2016    PT Comments    Pt more fluent with switching LVAD from battery/wall unit. Ambulation limited today by onset of L flank pain at drive line. Pt reports he hasn't had this pain since the first day. RN notified. Acute PT to con't to follow.   Follow Up Recommendations  Home health PT;Supervision/Assistance - 24 hour     Equipment Recommendations  Rolling walker with 5" wheels;3in1 (PT)    Recommendations for Other Services       Precautions / Restrictions Precautions Precautions: Sternal Precaution Booklet Issued: Yes (comment) Precaution Comments: LVAD  Restrictions Weight Bearing Restrictions: Yes Other Position/Activity Restrictions: sternal    Mobility  Bed Mobility               General bed mobility comments: pt up in chair upon arrival  Transfers Overall transfer level: Needs assistance Equipment used: None Transfers: Sit to/from Stand Sit to Stand: Min guard         General transfer comment: hands on knees, used LEs to push up, increased time  Ambulation/Gait Ambulation/Gait assistance: Min guard Ambulation Distance (Feet): 200 Feet Assistive device: Rolling walker (2 wheeled) Gait Pattern/deviations: Step-through pattern;Trunk flexed Gait velocity: decreased Gait velocity interpretation: 1.31 - 2.62 ft/sec, indicative of limited community ambulator General Gait Details: initally limited by L flank pain.   Stairs             Wheelchair Mobility    Modified Rankin (Stroke Patients Only)       Balance Overall balance assessment: Needs assistance Sitting-balance  support: No upper extremity supported;Feet supported Sitting balance-Leahy Scale: Good     Standing balance support: Bilateral upper extremity supported Standing balance-Leahy Scale: Fair Standing balance comment: walker for ambulation                            Cognition Arousal/Alertness: Awake/alert Behavior During Therapy: WFL for tasks assessed/performed Overall Cognitive Status: Within Functional Limits for tasks assessed                                 General Comments: pt indep able to transfer line from battery to/from wall without cues      Exercises      General Comments General comments (skin integrity, edema, etc.): spoke with RN regarding L flank pain. pt reports "i haven't had this pain since the first day".       Pertinent Vitals/Pain Pain Assessment: 0-10 Pain Score: 8  Pain Location: L flank pain during amb at drive line site Pain Descriptors / Indicators: Sharp Pain Intervention(s): Limited activity within patient's tolerance    Home Living                      Prior Function            PT Goals (current goals can now be found in the care plan section) Progress towards PT goals: Progressing toward goals    Frequency    Min 3X/week      PT Plan Current plan remains appropriate  Co-evaluation              AM-PAC PT "6 Clicks" Daily Activity  Outcome Measure  Difficulty turning over in bed (including adjusting bedclothes, sheets and blankets)?: None Difficulty moving from lying on back to sitting on the side of the bed? : None Difficulty sitting down on and standing up from a chair with arms (e.g., wheelchair, bedside commode, etc,.)?: A Little Help needed moving to and from a bed to chair (including a wheelchair)?: A Little Help needed walking in hospital room?: A Little Help needed climbing 3-5 steps with a railing? : A Little 6 Click Score: 20    End of Session   Activity Tolerance: Patient  limited by pain Patient left: in chair;with call bell/phone within reach Nurse Communication: Mobility status;Precautions PT Visit Diagnosis: Other abnormalities of gait and mobility (R26.89)     Time: 1030-1055 PT Time Calculation (min) (ACUTE ONLY): 25 min  Charges:  $Gait Training: 8-22 mins $Therapeutic Activity: 8-22 mins                    G Codes:       Lewis Shock, PT, DPT Pager #: [phone] Office #: [phone]    Mirko Tailor M Eulia Hatcher 06/04/2017, 11:36 AM

## 2017-06-04 NOTE — Progress Notes (Addendum)
Patient ID: Benjamin Sherman, male   DOB: 1970/07/02, 47 y.o.   MRN: [id number]    Advanced Heart Failure Rounding Note  Subjective:    Events: -Admitted 04/21/17 with cardiogenic shock and atrial flutter.  -Central line placed. Initial co-ox 39%. -IABP placed 04/25/17-> 04/30/17 with persistent cardiogenic shock.  -DCCV atrial flutter 4/12.   -S/p multiple teeth extractions 05/11/17.  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  -Extubated on 5/1  - driveline trauma- from repositioning in bed. 5/9 - 5/10 Received 1uRBCs on 5/10. - 5/10 Ramp echo with speed turned down to 5300.  CT Chest 05/23/17 resolution of R pleural effusion. Small left pleural effusion with suggestion of small loculated component over the lateral left upper lobe. Bibasilar linear atelectasis.   CT chest abdomen/chest  05/31/2017  1. Postoperative changes. 2. Cardiomegaly and small pericardial effusion, decreased in size since the previous exam. 3. Small LEFT pleural effusion. 4. Bibasilar atelectasis.  No edema. 5. Hepatic steatosis. 6. Significant stool burden. 7. Normal appendix. 8. Degenerative changes at L5-S1.  Remains afebrile. Denies SOB. Denies blood in urine or stool. Walked yesterday with no problems. Frustrated and hopeful to go home today.    LVAD Interrogation HM 3: Speed: 5350 Flow: 4.4  PI: 3.2 Power: 3.8. No PI events. Pump off and low flow alarm on 06/02/17 6:06 am. Pt does not recall any disconnection. VAD coordinator, Kirt Boys, sending data to [facility].   Labs pending today - refused at first, but now agreeable.   Objective:   Weight Range:  Vital Signs:   Temp:  [97.6 F (36.4 C)-98.7 F (37.1 C)] 98.7 F (37.1 C) (05/20 0509) Pulse Rate:  [88-97] 97 (05/20 0509) Resp:  [23-28] 24 (05/20 0509) BP: (77-120)/(59-83) 90/78 (05/20 0509) SpO2:  [97 %-100 %] 100 % (05/20 0509) Weight:  [196 lb 6.9 oz (89.1 kg)] 196 lb 6.9 oz (89.1 kg) (05/20 0625) Last BM Date: 06/02/17  SBP 70-80s  Weight change: Filed Weights   06/02/17 0423 06/03/17 0432 06/04/17 0625  Weight: 198 lb 12.8 oz (90.2 kg) 198 lb 10.2 oz (90.1 kg) 196 lb 6.9 oz (89.1 kg)   Intake/Output:   Intake/Output Summary (Last 24 hours) at 06/04/2017 0752 Last data filed at 06/04/2017 0513 Gross per 24 hour  Intake 614.5 ml  Output 1751 ml  Net -1136.5 ml    Physical Exam   GENERAL: Well appearing this am. NAD.  HEENT: Normal. NECK: Supple, JVP 7-8 cm. Carotids OK.  CARDIAC:  Mechanical heart sounds with LVAD hum present.  LUNGS:  CTAB, normal effort.  ABDOMEN:  NT, ND, no HSM. No bruits or masses. +BS  LVAD exit site: Dressing dry and intact. No erythema. Small amount of serous drainage present on gauze. Stabilization device present and accurately applied. Driveline dressing changed daily per sterile technique. EXTREMITIES:  Warm and dry. No cyanosis, clubbing, rash, or edema.  NEUROLOGIC:  Alert & oriented x 3. Cranial nerves grossly intact. Moves all 4 extremities w/o difficulty. Affect pleasant    Telemetry    NSR 90s. Personally reviewed.   EKG   No new tracings.    Labs    Basic Metabolic Panel: Recent Labs  Lab 05/30/17 0246 05/31/17 0702 06/01/17 0728 06/02/17 0525 06/03/17 0701  NA 129* 130* 131* 133* 132*  K 3.9 4.1 4.0 4.1 4.3  CL 95* 94* 97* 100* 98*  CO2 23 26 26 24 25   GLUCOSE 127* 118* 92 118* 112*  BUN 15 16 17 17 17   CREATININE 1.34* 1.18 1.32*  1.22 1.13  CALCIUM 8.4* 8.7* 8.8* 8.6* 8.6*   Liver Function Tests: Recent Labs  Lab 05/29/17 0842 05/30/17 0246  AST 26 25  ALT 13* 13*  ALKPHOS 201* 175*  BILITOT 1.3* 1.0  PROT 8.4* 7.8  ALBUMIN 2.3* 2.1*   No results for input(s): LIPASE, AMYLASE in the last 168 hours. No results for input(s): AMMONIA in the last 168 hours.  CBC: Recent Labs  Lab 05/30/17 0246 05/31/17 0702 06/01/17 0728 06/02/17 0525 06/03/17 0701  WBC 9.5 8.2 6.6 7.1 7.4  HGB 9.5* 9.6* 8.9* 8.4* 8.1*  HCT 28.9* 29.6* 27.9* 26.1* 25.7*  MCV 84.3 83.6 84.3 84.2 84.3    PLT 523* 532* 442* 449* 477*   Cardiac Enzymes: No results for input(s): CKTOTAL, CKMB, CKMBINDEX, TROPONINI in the last 168 hours. BNP: BNP (last 3 results) Recent Labs    04/21/17 1212 05/16/17 0529 05/22/17 0403  BNP 483.0* 262.3* 221.3*   ProBNP (last 3 results) No results for input(s): PROBNP in the last 8760 hours.  Other results:  Imaging: No results found.  Medications:    Scheduled Medications: . allopurinol  100 mg Oral Daily  . amiodarone  200 mg Oral Daily  . aspirin EC  81 mg Oral Daily  . bisacodyl  10 mg Oral Daily   Or  . bisacodyl  10 mg Rectal Daily  . chlorhexidine  15 mL Mouth/Throat BID  . clonazePAM  1 mg Oral QHS  . docusate sodium  200 mg Oral Daily  . feeding supplement (PRO-STAT SUGAR FREE 64)  30 mL Oral BID  . ferrous fumarate-b12-vitamic C-folic acid  1 capsule Oral BID  . furosemide  40 mg Oral Daily  . guaiFENesin  600 mg Oral BID  . insulin aspart  0-15 Units Subcutaneous TID WC  . insulin aspart  0-5 Units Subcutaneous QHS  . losartan  25 mg Oral Daily  . pantoprazole  40 mg Oral Daily  . potassium chloride  40 mEq Oral Daily  . Warfarin - Pharmacist Dosing Inpatient   Does not apply q1800    Infusions: . sodium chloride    . sodium chloride    . lactated ringers Stopped (05/22/17 0926)    PRN Medications:    Assessment:   Tilak Oakley is a 47 y.o. 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 eosinophilic cardiomyopathy, LV thrombus, hypothyroidism, DM2, CKD Stage II-III and traumatic [facility] 11/18 after fall.   Admitted with cardiogenic shock.  Plan/Discussion:    1. 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 Speed adjusted to 5300 with ramp echo 5/10.  MAP 80s today.  - Pump off and low flow alarm on 5/18. Data sent to [facility]. Pt does not recall a disconnection.  - Continue ASA 81 daily.  - Warfarin for goal INR 2-2.5. INR pending.  2. Acute on chronic systolic HF -> cardiogenic  shock:  Nonischemic cardiomyopathy. ECHO 11/2016 EF 25-30% CMRI EF 22%. Bedside echo in HF clinic EF 20% in 3/19.  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 Did not respond to steroids. Admitted 4/6 with cardiogenic shock and volume overload co-ox 38%. Echo 04/23/17: LVEF 20-25% with large LV clot, RV mild to moderately down. BMET pending.  - Continue lasix 40 mg daily.  - Continue losartan 25 mg daily.  3. Atrial flutter: New onset. S/p DCCV to NSR on 4/12. Maintaining NSR.  - Continue amio 200 mg daily   4. AKI CKD Stage II-III: Likely  ATN due to shock.  Resolved.  5. H/o LV Thrombus: resected with VAD placement - Continue warfarin.  No change.   6. DMII - Per TCTS.  - No change to current plan.   7. Hypothyroidism: TFTs OK 04/22/17. TSH elevated at 10.848 4/23. T4 and T3 normal.  - No change to current plan.   8. RLE erythema/cellulitis: Resolved 9. ID: Persistent fevers. Covered with vanc, meropenum, and fluconazole initially. CT Chest 05/23/17 resolution of R pleural effusion. Small left pleural effusion with suggestion of small loculated component over the lateral left upper lobe. Bibasilar linear atelectasis.  CT jaw without infection per CT.  ID following. Cultures negative. Now observing off abx. Remains afebrile.  - Continue to follow off abx, no source for fever (?drug fever).  10. Anemia-post-op blood loss: No overt bleeding, transfuse hgb < 8. CBC pending this am. Denies blood in urine or stool.   Disposition: Anticipate discharge today if hemoglobin and INR stable. Uses [facility] in [place] for pharmacy. Will arrange follow up in VAD clinic.   I reviewed the LVAD parameters from today, and compared the results to the patient's prior recorded data.  No programming changes were made.  The LVAD is functioning within specified parameters.  The patient performs LVAD self-test daily.  LVAD interrogation was negative for any significant power changes, alarms or PI  events/speed drops.  LVAD equipment check completed and is in good working order.  Back-up equipment present.   LVAD education done on emergency procedures and precautions and reviewed exit site care.  Alford Highland, NP  06/04/2017 7:52 AM   VAD Team --- VAD ISSUES ONLY--- Pager [phone] (7am - 7am)  Advanced Heart Failure Team Pager [phone] (M-F; 7a - 4p)  Please contact [facility] for night-coverage after hours (4p -7a ) and weekends on [email or web address]  Patient seen and examined with the above-signed Advanced Practice Provider and/or Housestaff. I personally reviewed laboratory data, imaging studies and relevant notes. I independently examined the patient and formulated the important aspects of the plan. I have edited the note to reflect any of my changes or salient points. I have personally discussed the plan with the patient and/or family.  Remains frustrated. Eager to go home. Denies SOB, orthopnea or PND. Refused labs this am but then acquiesced. INR 1.97. VAD interrogated personally. Parameters stable. There was a brief pump stop on 06/02/17. This was reviewed with [employer] engineers and confirmed that the pump stop button was pressed on power unit. Discussed with nursing leadership on 2C.   OK for d/c home today with close f/u.   [name], MD  1:41 PM

## 2017-06-04 NOTE — Progress Notes (Signed)
ANTICOAGULATION CONSULT NOTE - Follow-Up Consult  Pharmacy Consult for Warfarin  Indication: LVAD  Allergies  Allergen Reactions  . Bee Venom     UNSPECIFIED REACTION     Patient Measurements: Height: 6' (182.9 cm) Weight: 196 lb 6.9 oz (89.1 kg) IBW/kg (Calculated) : 77.6 Heparin Dosing Weight: n/a  Vital Signs: Temp: 98.6 F (37 C) (05/20 0754) Temp Source: Oral (05/20 0754) BP: 91/77 (05/20 0754) Pulse Rate: 90 (05/20 0754)  Labs: Recent Labs    06/02/17 0525 06/02/17 0527 06/03/17 0701 06/04/17 0919  HGB 8.4*  --  8.1* 9.2*  HCT 26.1*  --  25.7* 29.0*  PLT 449*  --  477* 459*  LABPROT 20.5*  --  22.2* 22.2*  INR 1.77  --  1.97 1.97  HEPARINUNFRC  --  0.37 0.27*  --   CREATININE 1.22  --  1.13 1.03    Estimated Creatinine Clearance: 96.3 mL/min (by C-G formula based on SCr of 1.03 mg/dL).   Medical History: Past Medical History:  Diagnosis Date  . CHF (congestive heart failure) (HCC)   . Diabetes mellitus without complication [facility])     Assessment: 47 yo male s/p LVAD placement 4/30 on warfarin per pharmacy. INr slightly subtherapeutic today at 1.97, dose increased to 6mg  yesterday. LDH stable, pt to be discharged today. Recommend 6mg  daily with close INR follow-up later this week.   Goal of Therapy:  INR goal 2-2.5  Monitor platelets by anticoagulation protocol: Yes   Plan:  -Warfarin 6 mg daily -INR check later this week  Fred[place] Highland, PharmD, BCPS PGY-2 Cardiology Pharmacy Resident Pager: [phone] 06/04/2017

## 2017-06-04 NOTE — Progress Notes (Signed)
    [facility]    Date of Admission:  04/21/2017     Patient has been afebrile since discontinuing abtx. No new sources of infection. Agree with plan to discharge. No need for further abtx.  Call if you would like [place] back on board.  [facility] Cell: [phone] Pager: [phone]  06/04/2017, 9:53 AM

## 2017-06-04 NOTE — Progress Notes (Signed)
PA made aware about the pain on the driveline area while walking with PT. No order given.

## 2017-06-04 NOTE — Progress Notes (Signed)
Discharged home accompanied by friend, stable, discharge instructions and prescription given to pt. Lvad equipments and belongings with pt.

## 2017-06-04 NOTE — Progress Notes (Signed)
CSW met at bedside with patient who was preparing for discharge. Patient with a smile on his face is thrilled to be going home. He spoke of how grateful he is to have received a LVAD. He reports he is feeling well and looking forward to joining the LVAD walking club. Patient appears to be ready for discharge and looking forward to beginning a new life. Patient reports he has some friends picking him up. CSW will follow on an outpatient basis through the VAD clinic.  Raquel Sarna, [place], [place]

## 2017-06-05 NOTE — Care Management (Signed)
Pt discharged late yesterday.  [facility] aware that pt discharged yesterday - HF HH services to began no later than 5/22. Pt also confirmed he received  RW and 3:1 prior to discharge.

## 2017-06-06 ENCOUNTER — Telehealth: Payer: Self-pay | Admitting: Licensed Clinical Social Worker

## 2017-06-06 DIAGNOSIS — I5022 Chronic systolic (congestive) heart failure: Secondary | ICD-10-CM

## 2017-06-06 NOTE — Telephone Encounter (Signed)
CSW contacted [facility] to make arrangements for appointment for VAD clinic next week. Patient informed and [facility] will reach out the day before to confirm pick up time. Lasandra Beech, LCSW, CCSW-MCS [phone]

## 2017-06-07 ENCOUNTER — Ambulatory Visit (HOSPITAL_COMMUNITY)
Admission: RE | Admit: 2017-06-07 | Discharge: 2017-06-07 | Disposition: A | Discharge status: Home / Self Care | Payer: Medicaid Other | Source: Ambulatory Visit | Attending: Cardiology | Admitting: Cardiology

## 2017-06-07 DIAGNOSIS — Z7901 Long term (current) use of anticoagulants: Secondary | ICD-10-CM

## 2017-06-07 DIAGNOSIS — T85848A Pain due to other internal prosthetic devices, implants and grafts, initial encounter: Secondary | ICD-10-CM | POA: Diagnosis not present

## 2017-06-07 DIAGNOSIS — Z452 Encounter for adjustment and management of vascular access device: Secondary | ICD-10-CM | POA: Diagnosis not present

## 2017-06-07 DIAGNOSIS — Z95811 Presence of heart assist device: Secondary | ICD-10-CM

## 2017-06-07 LAB — CULTURE, BLOOD (ROUTINE X 2)
Culture: NO GROWTH
Special Requests: ADEQUATE

## 2017-06-07 MED ORDER — GABAPENTIN 300 MG PO CAPS: 300.0000 mg | ORAL_CAPSULE | Freq: Every day | ORAL | 6 refills | Status: DC

## 2017-06-07 NOTE — Progress Notes (Signed)
Pt paged VAD pager last night stating that he was having issues with his driveline. The pts issue was very unclear. Pt was instructed to report to clinic for evaluation.   Pt states that Gene has been changing his driveline dressing with no problems. Existing VAD dressing removed and site care performed using sterile technique. Drive line exit site cleaned with Chlora prep applicators x 2, allowed to dry, and gauze dressing with silver strip re-applied. Exit site healing and partially incorporated, the velour is fully implanted at exit site. Small amount of serous drainage. No redness, tenderness, foul odor or rash noted. Drive line anchor re-applied. Pt denies fever or chills.       Pt states that he is having pain over left rib cage over placement of pump. Pt states it is a burn type of pain. We will start Gabapentin per Dr. Gala Romney 300 mg daily.  Pt already has f/u scheduled for Tuesday 5/28.  [name] RN, BSN VAD Coordinator 24/7 Pager [phone]

## 2017-06-08 ENCOUNTER — Emergency Department (HOSPITAL_COMMUNITY): Payer: Medicaid Other

## 2017-06-08 ENCOUNTER — Ambulatory Visit (HOSPITAL_COMMUNITY): Payer: Self-pay | Admitting: Pharmacist

## 2017-06-08 ENCOUNTER — Other Ambulatory Visit: Payer: Self-pay

## 2017-06-08 ENCOUNTER — Emergency Department (HOSPITAL_COMMUNITY)
Admission: EM | Admit: 2017-06-08 | Discharge: 2017-06-08 | Disposition: A | Discharge status: Home / Self Care | Payer: Medicaid Other | Attending: Emergency Medicine | Admitting: Emergency Medicine

## 2017-06-08 ENCOUNTER — Telehealth (HOSPITAL_COMMUNITY): Payer: Self-pay | Admitting: *Deleted

## 2017-06-08 DIAGNOSIS — E039 Hypothyroidism, unspecified: Secondary | ICD-10-CM | POA: Insufficient documentation

## 2017-06-08 DIAGNOSIS — Z87891 Personal history of nicotine dependence: Secondary | ICD-10-CM | POA: Insufficient documentation

## 2017-06-08 DIAGNOSIS — R110 Nausea: Secondary | ICD-10-CM

## 2017-06-08 DIAGNOSIS — I5022 Chronic systolic (congestive) heart failure: Secondary | ICD-10-CM | POA: Insufficient documentation

## 2017-06-08 DIAGNOSIS — Z79899 Other long term (current) drug therapy: Secondary | ICD-10-CM | POA: Insufficient documentation

## 2017-06-08 DIAGNOSIS — I130 Hypertensive heart and chronic kidney disease with heart failure and stage 1 through stage 4 chronic kidney disease, or unspecified chronic kidney disease: Secondary | ICD-10-CM | POA: Diagnosis not present

## 2017-06-08 DIAGNOSIS — Z7901 Long term (current) use of anticoagulants: Secondary | ICD-10-CM | POA: Diagnosis not present

## 2017-06-08 DIAGNOSIS — Z7982 Long term (current) use of aspirin: Secondary | ICD-10-CM | POA: Insufficient documentation

## 2017-06-08 DIAGNOSIS — Z95811 Presence of heart assist device: Secondary | ICD-10-CM | POA: Diagnosis not present

## 2017-06-08 DIAGNOSIS — R0789 Other chest pain: Secondary | ICD-10-CM | POA: Diagnosis not present

## 2017-06-08 DIAGNOSIS — E1122 Type 2 diabetes mellitus with diabetic chronic kidney disease: Secondary | ICD-10-CM | POA: Diagnosis not present

## 2017-06-08 DIAGNOSIS — N183 Chronic kidney disease, stage 3 (moderate): Secondary | ICD-10-CM | POA: Diagnosis not present

## 2017-06-08 LAB — COMPREHENSIVE METABOLIC PANEL
ALT: 9 U/L — ABNORMAL LOW (ref 17–63)
AST: 18 U/L (ref 15–41)
Albumin: 2.5 g/dL — ABNORMAL LOW (ref 3.5–5.0)
Alkaline Phosphatase: 123 U/L (ref 38–126)
Anion gap: 12 (ref 5–15)
BUN: 13 mg/dL (ref 6–20)
CO2: 22 mmol/L (ref 22–32)
Calcium: 9.5 mg/dL (ref 8.9–10.3)
Chloride: 103 mmol/L (ref 101–111)
Creatinine, Ser: 1.28 mg/dL — ABNORMAL HIGH (ref 0.61–1.24)
GFR calc Af Amer: >60 mL/min (ref >60)
GFR calc non Af Amer: >60 mL/min (ref >60)
Glucose, Bld: 95 mg/dL (ref 65–99)
Potassium: 4.4 mmol/L (ref 3.5–5.1)
Sodium: 137 mmol/L (ref 135–145)
Total Bilirubin: 1.2 mg/dL (ref 0.3–1.2)
Total Protein: 8.6 g/dL — ABNORMAL HIGH (ref 6.5–8.1)

## 2017-06-08 LAB — CBC
HCT: 30.6 % — ABNORMAL LOW (ref 39.0–52.0)
Hemoglobin: 9.5 g/dL — ABNORMAL LOW (ref 13.0–17.0)
MCH: 26.6 pg (ref 26.0–34.0)
MCHC: 31 g/dL (ref 30.0–36.0)
MCV: 85.7 fL (ref 78.0–100.0)
Platelets: 614 10*3/uL — ABNORMAL HIGH (ref 150–400)
RBC: 3.57 MIL/uL — ABNORMAL LOW (ref 4.22–5.81)
RDW: 16.3 % — ABNORMAL HIGH (ref 11.5–15.5)
WBC: 8.3 10*3/uL (ref 4.0–10.5)

## 2017-06-08 LAB — POCT INR: INR: 2.9 (ref 2.0–3.0)

## 2017-06-08 LAB — LACTATE DEHYDROGENASE: LDH: 261 U/L — ABNORMAL HIGH (ref 98–192)

## 2017-06-08 LAB — PROTIME-INR
INR: 2.6
Prothrombin Time: 27.6 seconds — ABNORMAL HIGH (ref 11.4–15.2)

## 2017-06-08 LAB — LIPASE, BLOOD: Lipase: 51 U/L (ref 11–51)

## 2017-06-08 MED ORDER — ONDANSETRON HCL 4 MG PO TABS: 4.0000 mg | ORAL_TABLET | Freq: Three times a day (TID) | ORAL | 0 refills | Status: DC | PRN

## 2017-06-08 MED ORDER — ONDANSETRON HCL 4 MG/2ML IJ SOLN
4.0000 mg | Freq: Once | INTRAMUSCULAR | Status: AC
  Administered 2017-06-08: 4 mg via INTRAVENOUS
  Filled 2017-06-08: qty 2

## 2017-06-08 MED ORDER — ACETAMINOPHEN 500 MG PO TABS
1000.0000 mg | ORAL_TABLET | Freq: Once | ORAL | Status: AC
  Administered 2017-06-08: 1000 mg via ORAL
  Filled 2017-06-08: qty 2

## 2017-06-08 NOTE — ED Notes (Signed)
VAD Coordinator notified of patient being in department

## 2017-06-08 NOTE — ED Provider Notes (Signed)
[facility] EMERGENCY DEPARTMENT Provider Note   CSN: [id number] Arrival date & time: 06/08/17  1928    History   Chief Complaint Chief Complaint  Patient presents with  . Nausea  . LVAD pt    HPI Benjamin Sherman is a 47 y.o. male.  HPI Patient is a 47 year old male with complex medical history as below, notable for congestive heart failure with a recently placed LVAD on 05/15/2017, who was just discharged from the hospital on Monday, who presents today due to nausea and left sided chest pain.  He reports he was feeling well yesterday.  This afternoon around 4 PM, he began having pain in his left chest near where he reports his driveline is.  He denies any fever or chills.  On his way to the hospital, he became nauseated.  He has not vomited.  He is not short of breath.  He denies any leg pain or swelling.  He completed a course of antibiotics while in the hospital.  He had a complicated hospital course with multiple fevers and leukocytosis.  However, he was discharged after his course of antibiotic's was completed and he had been afebrile for 48 hours.  Past Medical History:  Diagnosis Date  . CHF (congestive heart failure) (HCC)   . Diabetes mellitus without complication [facility])     Patient Active Problem List   Diagnosis Date Noted  . Fever and chills 05/29/2017  . LVAD (left ventricular assist device) present (HCC) 05/15/2017  . Advance care planning   . Goals of care, counseling/discussion   . Palliative care encounter   . PAF (paroxysmal atrial fibrillation) (HCC)   . Fluid overload 04/21/2017  . Cardiogenic shock (HCC)   . Acute on chronic systolic and diastolic heart failure, NYHA class 4 (HCC)   . Subarachnoid hemorrhage (HCC)   . Pneumothorax on right   . Syncope and collapse   . Chronic systolic CHF (congestive heart failure) (HCC)   . Acute thrombus of left ventricle (HCC)   . CKD (chronic kidney disease), stage III (HCC)   . Diabetes mellitus  type 2, uncontrolled, with complications (HCC)   . Traumatic subarachnoid hemorrhage with loss of consciousness of 30 minutes or less (HCC)   . Anticoagulated   . Syncope   . Hypotension   . Hyperkalemia   . Traumatic subarachnoid hematoma with loss of consciousness (HCC) 12/06/2016  . Hypothyroid 11/28/2016  . CKD (chronic kidney disease), stage II 11/28/2016  . DM II (diabetes mellitus, type II), controlled (HCC) 11/28/2016  . LV (left ventricular) mural thrombus without MI 11/28/2016  . CHF (congestive heart failure) (HCC) 11/06/2016    Past Surgical History:  Procedure Laterality Date  . CARDIOVERSION N/A 04/27/2017   Procedure: CARDIOVERSION;  Surgeon: Dolores Patty, MD;  Location: [facility] OR;  Service: Cardiovascular;  Laterality: N/A;  . IABP INSERTION N/A 04/25/2017   Procedure: IABP INSERTION;  Surgeon: Dolores Patty, MD;  Location: [facility] INVASIVE CV LAB;  Service: Cardiovascular;  Laterality: N/A;  . IABP INSERTION N/A 05/14/2017   Procedure: IABP INSERTION;  Surgeon: Dolores Patty, MD;  Location: [facility] INVASIVE CV LAB;  Service: Cardiovascular;  Laterality: N/A;  . INSERTION OF IMPLANTABLE LEFT VENTRICULAR ASSIST DEVICE N/A 05/15/2017   Procedure: INSERTION OF IMPLANTABLE LEFT VENTRICULAR ASSIST DEVICE-HM3;  Surgeon: Kerin Perna, MD;  Location: [facility] OR;  Service: Open Heart Surgery;  Laterality: N/A;  HM3  CIRC ARREST  NITRIC OXIDE  . MULTIPLE EXTRACTIONS WITH ALVEOLOPLASTY N/A  05/10/2017   Procedure: Extraction of 2-6, 8-15, 17, 19-24, 27- 30, and 32 with alveoloplasty, and maxillary and mandibular lateral exotoses reductions.;  Surgeon: Charlynne Pander, DDS;  Location: [facility] OR;  Service: Oral Surgery;  Laterality: N/A;  . RIGHT HEART CATH N/A 04/25/2017   Procedure: RIGHT HEART CATH;  Surgeon: Dolores Patty, MD;  Location: [facility] INVASIVE CV LAB;  Service: Cardiovascular;  Laterality: N/A;  . RIGHT HEART CATH N/A 05/14/2017   Procedure: RIGHT HEART CATH;  Surgeon:  Dolores Patty, MD;  Location: [facility] INVASIVE CV LAB;  Service: Cardiovascular;  Laterality: N/A;  . RIGHT/LEFT HEART CATH AND CORONARY ANGIOGRAPHY N/A 11/10/2016   Procedure: RIGHT/LEFT HEART CATH AND CORONARY ANGIOGRAPHY;  Surgeon: Dolores Patty, MD;  Location: [facility] INVASIVE CV LAB;  Service: Cardiovascular;  Laterality: N/A;  . TEE WITHOUT CARDIOVERSION N/A 05/15/2017   Procedure: TRANSESOPHAGEAL ECHOCARDIOGRAM (TEE);  Surgeon: Donata Clay, Theron Arista, MD;  Location: [facility] OR;  Service: Open Heart Surgery;  Laterality: N/A;        Home Medications    Prior to Admission medications   Medication Sig Start Date End Date Taking? Authorizing Provider  allopurinol (ZYLOPRIM) 100 MG tablet Take 1 tablet (100 mg total) by mouth daily. 06/05/17  Yes Alford Highland, NP  amiodarone (PACERONE) 200 MG tablet Take 1 tablet (200 mg total) by mouth daily. 06/05/17  Yes Alford Highland, NP  aspirin 81 MG EC tablet Take 1 tablet (81 mg total) by mouth daily. 06/05/17  Yes Alford Highland, NP  docusate sodium (COLACE) 100 MG capsule Take 2 capsules (200 mg total) by mouth daily. 06/05/17  Yes Alford Highland, NP  gabapentin (NEURONTIN) 300 MG capsule Take 1 capsule (300 mg total) by mouth daily. 06/07/17  Yes Bensimhon, Bevelyn Buckles, MD  potassium chloride SA (K-DUR,KLOR-CON) 20 MEQ tablet Take 2 tablets (40 mEq total) by mouth daily. 06/05/17  Yes Alford Highland, NP  warfarin (COUMADIN) 6 MG tablet Take 1 tablet (6 mg total) by mouth daily. 06/04/17  Yes Alford Highland, NP  bisacodyl (DULCOLAX) 5 MG EC tablet Take 2 tablets (10 mg total) by mouth daily. Patient not taking: Reported on 06/08/2017 06/05/17   Alford Highland, NP  ferrous fumarate-b12-vitamic C-folic acid (TRINSICON / FOLTRIN) capsule Take 1 capsule by mouth 2 (two) times daily. Patient not taking: Reported on 06/08/2017 06/04/17   Alford Highland, NP  furosemide (LASIX) 40 MG tablet Take 1 tablet (40 mg total) by mouth daily. Patient not taking: Reported on  06/08/2017 03/27/17   Tonye Becket D, NP  losartan (COZAAR) 25 MG tablet Take 1 tablet (25 mg total) by mouth at bedtime. Patient not taking: Reported on 06/08/2017 03/27/17   Tonye Becket D, NP  ondansetron (ZOFRAN) 4 MG tablet Take 1 tablet (4 mg total) by mouth every 8 (eight) hours as needed for nausea or vomiting. 06/08/17   Lennette Bihari, MD  pantoprazole (PROTONIX) 40 MG tablet Take 1 tablet (40 mg total) by mouth daily. Patient not taking: Reported on 06/08/2017 06/05/17   Alford Highland, NP  traMADol (ULTRAM) 50 MG tablet Take 1 tablet (50 mg total) by mouth every 4 (four) hours as needed for moderate pain. Patient not taking: Reported on 06/08/2017 06/04/17   Alford Highland, NP    Family History Family History  Problem Relation Age of Onset  . CAD Father     Social History Social History   Tobacco Use  . Smoking  status: Former Smoker    Types: Cigars    Last attempt to quit: 11/16/2016    Years since quitting: 0.5  . Smokeless tobacco: Never Used  . Tobacco comment: 6 cigars per wk  Substance Use Topics  . Alcohol use: Yes    Alcohol/week: 8.4 oz    Types: 12 Cans of beer, 2 Shots of liquor per week    Comment: occ  . Drug use: No     Allergies   Bee venom   Review of Systems Review of Systems  Constitutional: Negative for chills and fever.  HENT: Negative for ear pain and sore throat.   Eyes: Negative for pain and visual disturbance.  Respiratory: Negative for cough and shortness of breath.   Cardiovascular: Positive for chest pain. Negative for palpitations.  Gastrointestinal: Positive for nausea. Negative for abdominal pain and vomiting.  Genitourinary: Negative for dysuria and hematuria.  Musculoskeletal: Negative for arthralgias and back pain.  Skin: Negative for color change and rash.  Neurological: Negative for seizures and syncope.  All other systems reviewed and are negative.    Physical Exam Updated Vital Signs BP (!) 115/99   Pulse 96   Temp  98.7 F (37.1 C) (Oral)   Resp 16   Ht 6' (1.829 m)   Wt 93.9 kg (207 lb)   SpO2 100%   BMI 28.07 kg/m   Physical Exam  Constitutional: He is oriented to person, place, and time. He appears well-developed and well-nourished.  HENT:  Head: Normocephalic and atraumatic.  Eyes: Conjunctivae are normal.  Neck: Neck supple.  Cardiovascular:  Mechanical hum from LVAD  Pulmonary/Chest: Effort normal and breath sounds normal. No respiratory distress. He exhibits tenderness.  Mild left-sided chest tenderness  Abdominal: Soft. He exhibits no distension. There is no tenderness.  Dressing in place over drive line is clean and dry  Musculoskeletal: He exhibits no edema.  Neurological: He is alert and oriented to person, place, and time.  Skin: Skin is warm and dry. Capillary refill takes less than 2 seconds.  Psychiatric: He has a normal mood and affect.  Nursing note and vitals reviewed.    ED Treatments / Results  Labs (all labs ordered are listed, but only abnormal results are displayed) Labs Reviewed  COMPREHENSIVE METABOLIC PANEL - Abnormal; Notable for the following components:      Result Value   Creatinine, Ser 1.28 (*)    Total Protein 8.6 (*)    Albumin 2.5 (*)    ALT 9 (*)    All other components within normal limits  CBC - Abnormal; Notable for the following components:   RBC 3.57 (*)    Hemoglobin 9.5 (*)    HCT 30.6 (*)    RDW 16.3 (*)    Platelets 614 (*)    All other components within normal limits  PROTIME-INR - Abnormal; Notable for the following components:   Prothrombin Time 27.6 (*)    All other components within normal limits  LACTATE DEHYDROGENASE - Abnormal; Notable for the following components:   LDH 261 (*)    All other components within normal limits  LIPASE, BLOOD  URINALYSIS, ROUTINE W REFLEX MICROSCOPIC    EKG EKG Interpretation  Date/Time:  Friday Jun 08 2017 19:38:13 EDT Ventricular Rate:  101 PR Interval:    QRS Duration: 64 QT  Interval:  350 QTC Calculation: 453 R Axis:   -107 Text Interpretation:  Accelerated Junctional rhythm Right superior axis deviation Anterior infarct , age undetermined Abnormal  ECG No acute changes Interpretation limited secondary to artifact Confirmed by Derwood Kaplan [phone]) on 06/08/2017 8:51:43 PM   Radiology Dg Chest Portable 1 View  Result Date: 06/08/2017 CLINICAL DATA:  47 y/o  M; chest pain. EXAM: PORTABLE CHEST 1 VIEW COMPARISON:  05/26/2017 chest radiograph. FINDINGS: Post median sternotomy with LVAD in situ in similar configuration to prior chest radiograph. Stable cardiomegaly. Blunting of left costal diaphragmatic angle may represent a small effusion or atelectasis. No focal consolidation. Bones are unremarkable. IMPRESSION: Blunted left costal diaphragmatic angle may represent small effusion or atelectasis. Stable cardiomegaly. No focal consolidation. Electronically Signed   By: Mitzi Hansen M.D.   On: 06/08/2017 22:39    Procedures Procedures (including critical care time)  Medications Ordered in ED Medications  ondansetron [facility]) injection 4 mg (4 mg Intravenous Given 06/08/17 2054)  acetaminophen (TYLENOL) tablet 1,000 mg (1,000 mg Oral Given 06/08/17 2226)     Initial Impression / Assessment and Plan / ED Course  I have reviewed the triage vital signs and the nursing notes.  Pertinent labs & imaging results that were available during my care of the patient were reviewed by me and considered in my medical decision making (see chart for details).    Patient is a 47 year old male with history as above who presents with nausea and left-sided chest pain.  He has had a very complicated recent hospital course.  He was discharged on Monday.  He does have a recently placed LVAD.  His skin around the LVAD driveline is well-appearing.  The patient has a normal map here.  He is overall well-appearing.  Labs and imaging were obtained as above.  His INR is therapeutic.   His other labs are within his baseline range.  The LVAD coordinator was contacted.  They have reviewed his labs and imaging here.  They have spoken with cardiology.  They feel patient is stable for discharge at this time.  I will prescribe him Zofran for his nausea.  Return precautions were discussed in detail.  Patient discharged in stable condition.  Final Clinical Impressions(s) / ED Diagnoses   Final diagnoses:  Nausea    ED Discharge Orders        Ordered    ondansetron (ZOFRAN) 4 MG tablet  Every 8 hours PRN     06/08/17 2209       Lennette Bihari, MD 06/08/17 2256    Derwood Kaplan, MD 06/09/17 [phone]

## 2017-06-08 NOTE — ED Triage Notes (Signed)
Patient c/o "not feeling good". States that he is having pain above his drive-line (patient LVAD installed 05/15/17), and nausea. A&O x4.

## 2017-06-08 NOTE — ED Notes (Signed)
Paged LVAD coordinator per RN

## 2017-06-08 NOTE — Telephone Encounter (Signed)
Georgiann Hahn (physical therapist w/[facility]) called to inform patient is doing well and did not need occupational therapy at this time.

## 2017-06-08 NOTE — ED Notes (Signed)
E-Signature not available. Patient verbalizes understanding of d/c instructions and has no further questions at this time.

## 2017-06-08 NOTE — ED Notes (Signed)
ED Provider at bedside. 

## 2017-06-12 ENCOUNTER — Telehealth (HOSPITAL_COMMUNITY): Payer: Self-pay

## 2017-06-12 ENCOUNTER — Encounter (HOSPITAL_COMMUNITY): Payer: Self-pay

## 2017-06-12 ENCOUNTER — Ambulatory Visit (HOSPITAL_COMMUNITY)
Admission: RE | Admit: 2017-06-12 | Discharge: 2017-06-12 | Disposition: A | Discharge status: Home / Self Care | Payer: Medicaid Other | Source: Ambulatory Visit | Attending: Cardiology | Admitting: Cardiology

## 2017-06-12 ENCOUNTER — Ambulatory Visit (HOSPITAL_COMMUNITY): Payer: Self-pay | Admitting: Pharmacist

## 2017-06-12 ENCOUNTER — Other Ambulatory Visit (HOSPITAL_COMMUNITY): Payer: Self-pay | Admitting: *Deleted

## 2017-06-12 VITALS — BP 98/0 | HR 66 | Ht 72.0 in | Wt 195.4 lb

## 2017-06-12 DIAGNOSIS — N183 Chronic kidney disease, stage 3 (moderate): Secondary | ICD-10-CM | POA: Insufficient documentation

## 2017-06-12 DIAGNOSIS — E119 Type 2 diabetes mellitus without complications: Secondary | ICD-10-CM | POA: Diagnosis not present

## 2017-06-12 DIAGNOSIS — I429 Cardiomyopathy, unspecified: Secondary | ICD-10-CM | POA: Diagnosis not present

## 2017-06-12 DIAGNOSIS — Z7901 Long term (current) use of anticoagulants: Secondary | ICD-10-CM | POA: Insufficient documentation

## 2017-06-12 DIAGNOSIS — Z8249 Family history of ischemic heart disease and other diseases of the circulatory system: Secondary | ICD-10-CM | POA: Insufficient documentation

## 2017-06-12 DIAGNOSIS — I5042 Chronic combined systolic (congestive) and diastolic (congestive) heart failure: Secondary | ICD-10-CM | POA: Insufficient documentation

## 2017-06-12 DIAGNOSIS — Z79899 Other long term (current) drug therapy: Secondary | ICD-10-CM | POA: Diagnosis not present

## 2017-06-12 DIAGNOSIS — E1122 Type 2 diabetes mellitus with diabetic chronic kidney disease: Secondary | ICD-10-CM | POA: Insufficient documentation

## 2017-06-12 DIAGNOSIS — Z87891 Personal history of nicotine dependence: Secondary | ICD-10-CM | POA: Insufficient documentation

## 2017-06-12 DIAGNOSIS — Q752 Hypertelorism: Secondary | ICD-10-CM

## 2017-06-12 DIAGNOSIS — Z7982 Long term (current) use of aspirin: Secondary | ICD-10-CM | POA: Diagnosis not present

## 2017-06-12 DIAGNOSIS — I5020 Unspecified systolic (congestive) heart failure: Secondary | ICD-10-CM | POA: Diagnosis not present

## 2017-06-12 DIAGNOSIS — E039 Hypothyroidism, unspecified: Secondary | ICD-10-CM | POA: Diagnosis not present

## 2017-06-12 DIAGNOSIS — I5043 Acute on chronic combined systolic (congestive) and diastolic (congestive) heart failure: Secondary | ICD-10-CM

## 2017-06-12 DIAGNOSIS — I130 Hypertensive heart and chronic kidney disease with heart failure and stage 1 through stage 4 chronic kidney disease, or unspecified chronic kidney disease: Secondary | ICD-10-CM | POA: Diagnosis present

## 2017-06-12 DIAGNOSIS — Z95811 Presence of heart assist device: Secondary | ICD-10-CM

## 2017-06-12 LAB — BASIC METABOLIC PANEL
Anion gap: 9 (ref 5–15)
BUN: 11 mg/dL (ref 6–20)
CO2: 25 mmol/L (ref 22–32)
Calcium: 9.5 mg/dL (ref 8.9–10.3)
Chloride: 102 mmol/L (ref 101–111)
Creatinine, Ser: 1.25 mg/dL — ABNORMAL HIGH (ref 0.61–1.24)
GFR calc Af Amer: >60 mL/min (ref >60)
GFR calc non Af Amer: >60 mL/min (ref >60)
Glucose, Bld: 107 mg/dL — ABNORMAL HIGH (ref 65–99)
Potassium: 4.6 mmol/L (ref 3.5–5.1)
Sodium: 136 mmol/L (ref 135–145)

## 2017-06-12 LAB — CBC
HCT: 32.6 % — ABNORMAL LOW (ref 39.0–52.0)
Hemoglobin: 10.1 g/dL — ABNORMAL LOW (ref 13.0–17.0)
MCH: 26.3 pg (ref 26.0–34.0)
MCHC: 31 g/dL (ref 30.0–36.0)
MCV: 84.9 fL (ref 78.0–100.0)
Platelets: 451 10*3/uL — ABNORMAL HIGH (ref 150–400)
RBC: 3.84 MIL/uL — ABNORMAL LOW (ref 4.22–5.81)
RDW: 16.7 % — ABNORMAL HIGH (ref 11.5–15.5)
WBC: 8.9 10*3/uL (ref 4.0–10.5)

## 2017-06-12 LAB — PROTIME-INR
INR: 2.36
Prothrombin Time: 25.7 seconds — ABNORMAL HIGH (ref 11.4–15.2)

## 2017-06-12 LAB — LACTATE DEHYDROGENASE: LDH: 203 U/L — ABNORMAL HIGH (ref 98–192)

## 2017-06-12 MED ORDER — WARFARIN SODIUM 6 MG PO TABS: ORAL_TABLET | ORAL | 6 refills | Status: DC

## 2017-06-12 MED ORDER — LOSARTAN POTASSIUM 25 MG PO TABS: 50.0000 mg | ORAL_TABLET | Freq: Every day | ORAL | 6 refills | Status: DC

## 2017-06-12 NOTE — Progress Notes (Signed)
LVAD Clinic Note   Primary Cardiologist: Dr. Gala Romney   HPI: Drummond Hankes is a 47 y.o. 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 ECHO showed severely reduced EF. 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 Placed on steroids without benefit.  Admitted 11/18 after fall/syncopal episode resulting in bifrontal SAH. Neurosurgery consulted. As he improved xarelto was restarted.  Discharged with Life Vest.  He was given a 30 day supply of medications.   Admitted 4/19 with cardiogenic shock in setting of AFL. Supported with inotropes and underwent DC-CV but had persistent shock. After numerous discussions about concern for adequate social support underwent placement of HM-3 LVAD on 05/15/17   Postop course complicated by persistent fevers and leukocytosis due to unknown source and and subtherapeutic INR. Blood cultures were negative. CT of chest, abdomen, and jaw all negative for infection.  ID was consulted. Completed antibiotic course with vanc, zosyn, diflucan, and meropenum. He remained afebrile off antibiotics x 48 hours prior to discharge. D/c home 06/04/17   Today he returned for first post-implant VAD Clinic visit. Was seen in ER on Friday for nausea. W/u negative. Says he felt better after large BM. Starting to feel much better. Walking more. Denies orthopnea or PND. No fevers, chills or problems with driveline. No bleeding, melena or neuro symptoms. No VAD alarms. Taking all meds as prescribed.    VAD Indication: Destination Therapy - due to limited social support. Pt discussed with Dr. Edwena Blow at [facility] prior to LVAD implant.  LVAD assessment:  VAD Speed: 5300 RPM Flow: 4.0 Power: 3.9 w PI: 5.7 Alarms: none Events: 5 - 15 PI events daily  Fixed speed: 5300 Low speed limit: 5000 Primary Controller:  Replace back up battery in96months. Back up controller: Replace back up battery in 71months.      Review of systems complete and found to be negative unless listed in HPI.    Past Medical History:  Diagnosis Date  . CHF (congestive heart failure) (HCC)   . Diabetes mellitus without complication (HCC)     Current Outpatient Medications  Medication Sig Dispense Refill  . allopurinol (ZYLOPRIM) 100 MG tablet Take 1 tablet (100 mg total) by mouth daily. 30 tablet 0  . amiodarone (PACERONE) 200 MG tablet Take 1 tablet (200 mg total) by mouth daily. 30 tablet 6  . aspirin 81 MG EC tablet Take 1 tablet (81 mg total) by mouth daily. 30 tablet 6  . bisacodyl (DULCOLAX) 5 MG EC tablet Take 2 tablets (10 mg total) by mouth daily. 30 tablet 0  . docusate sodium (COLACE) 100 MG capsule Take 2 capsules (200 mg total) by mouth daily. 10 capsule 0  . ferrous fumarate-b12-vitamic C-folic acid (TRINSICON / FOLTRIN) capsule Take 1 capsule by mouth 2 (two) times daily. 60 capsule 6  . furosemide (LASIX) 40 MG tablet Take 1 tablet (40 mg total) by mouth daily. 30 tablet 2  . gabapentin (NEURONTIN) 300 MG capsule Take 1 capsule (300 mg total) by mouth daily. 30 capsule 6  . losartan (COZAAR) 25 MG tablet Take 2 tablets (50 mg total) by mouth at bedtime. 60 tablet 6  . pantoprazole (PROTONIX) 40 MG tablet Take 1 tablet (40 mg total) by mouth daily. 30 tablet 6  . potassium chloride SA (K-DUR,KLOR-CON) 20 MEQ tablet Take 2 tablets (40 mEq total) by mouth daily. 60 tablet  6  . warfarin (COUMADIN) 6 MG tablet Take one tablet on Sun, Tues, Thurs, and Sat. Take 1/2 tab on Mon/Wed/Fri 30 tablet 6  . ondansetron (ZOFRAN) 4 MG tablet Take 1 tablet (4 mg total) by mouth every 8 (eight) hours as needed for nausea or vomiting. (Patient not taking: Reported on 06/12/2017) 12 tablet 0  . traMADol (ULTRAM) 50 MG tablet Take 1 tablet (50 mg total) by mouth every 4 (four) hours as needed for moderate pain. (Patient  not taking: Reported on 06/08/2017) 14 tablet 0   No current facility-administered medications for this encounter.   He is not taking any medications.    Allergies  Allergen Reactions  . Bee Venom     UNSPECIFIED REACTION       Social History   Socioeconomic History  . Marital status: Single    Spouse name: Not on file  . Number of children: Not on file  . Years of education: Not on file  . Highest education level: Not on file  Occupational History  . Occupation: Diplomatic Services operational officer Needs  . Financial resource strain: Not on file  . Food insecurity:    Worry: Not on file    Inability: Not on file  . Transportation needs:    Medical: Not on file    Non-medical: Not on file  Tobacco Use  . Smoking status: Former Smoker    Types: Cigars    Last attempt to quit: 11/16/2016    Years since quitting: 0.5  . Smokeless tobacco: Never Used  . Tobacco comment: 6 cigars per wk  Substance and Sexual Activity  . Alcohol use: Yes    Alcohol/week: 8.4 oz    Types: 12 Cans of beer, 2 Shots of liquor per week    Comment: occ  . Drug use: No  . Sexual activity: Not on file  Lifestyle  . Physical activity:    Days per week: Not on file    Minutes per session: Not on file  . Stress: Not on file  Relationships  . Social connections:    Talks on phone: Not on file    Gets together: Not on file    Attends religious service: Not on file    Active member of club or organization: Not on file    Attends meetings of clubs or organizations: Not on file    Relationship status: Not on file  . Intimate partner violence:    Fear of current or ex partner: Not on file    Emotionally abused: Not on file    Physically abused: Not on file    Forced sexual activity: Not on file  Other Topics Concern  . Not on file  Social History Narrative  . Not on file      Family History  Problem Relation Age of Onset  . CAD Father     Vitals:   06/12/17 1159 06/12/17 1201  BP: 112/64 (!) 98/0    Pulse: 66   Weight: 195 lb 6.4 oz (88.6 kg)   Height: 6' (1.829 m)     Wt Readings from Last 3 Encounters:  06/12/17 195 lb 6.4 oz (88.6 kg)  06/08/17 207 lb (93.9 kg)  06/04/17 196 lb 6.9 oz (89.1 kg)     Vital Signs:  Temp:  98.4 Doppler Pressure:98 Automatc BP: 112/64 (99) HR: 66 SPO2: UTO  Weight: 195.4 lb w/o eqt Last weight: 196 lb  PHYSICAL EXAM: General:  NAD.  HEENT: normal x for  endentulous Neck: supple. JVP not elevated.  Carotids 2+ bilat; no bruits. No lymphadenopathy or thryomegaly appreciated. Cor: LVAD hum.  Lungs: Clear. Abdomen: obese soft, nontender, non-distended. No hepatosplenomegaly. No bruits or masses. Good bowel sounds. Driveline site clean. Anchor in place.  Extremities: no cyanosis, clubbing, rash. Warm no edema  Neuro: alert & oriented x 3. No focal deficits. Moves all 4 without problem    ASSESSMENT & PLAN:  1. Chronic Combined Systolic/Diastolic Heart Failure. NICM  - ECHO 4/19 EF 20% CMRI EF 22%  - s/p HM-3 LVAD on 05/15/17 - He is doing very well 1 week out from HM-3 implant - NYHA II - Volume status looks good. Continue lasix 40 daily  2. VAD management - VAD interrogated personally. Parameters stable. - MAPs slightly elevated. Increase losartan to 50 daily - LDH 203 - INR 2.36. Discussed dosing with PharmD personally.  3. AFL - Maintain NSR on amio 200 daily. Wil continue for now   4. HTN - MAPs mildly elevated - Increase losartan to 50 daily  5. Hypothyroidism - Check TFTs next visit.   6. DMII - Needs to establish with community health and wellness as planned.   Total time spent 35 minutes. Over half that time spent discussing above.    [name], MD 06/12/17

## 2017-06-12 NOTE — Progress Notes (Signed)
Patient presents for hospital d/c follow up in VAD Clinic today, alone. He arrived by disability transportation. Reports no problems with VAD equipment or concerns with drive line.   Pt reports he is staying with "frat brother" Gerrianne Scale. Mr. Laural Benes works during the day, but pt does report he has multiple people "stopping by to check on me".   Pt did report to ED Friday night for "not feeling right" along with nausea. He says he has felt better since that visit. He did not pick up prn Zofran that was ordered by ED doc.  He denies any pain at this time; he did pick up Gabapentin and started taking 300 mg in the morning. Advised if he gets sleepy, dizzy with this med, he may take in the evening. Pt verbalized understanding of same.   Pt reports he is getting stronger and walking more each day. He is being taken to local [facility] and walking at least 2 laps daily. He denies any fevers, chills. Gene has been changing VAD dressing daily (except Friday night while pt at ED). He is wearing a concealed weapon shirt and feels much better wearing his peripherals this way.   Vital Signs:  Temp:  98.4 Doppler Pressure: 98 Automatc BP: 112/64 (99) HR: 66 SPO2: UTO  Weight: 195.4 lb w/o eqt Last weight: 196 lb  VAD Indication: Destination Therapy - due to limited social support. Pt discussed with [facility]wena [facility] at [facility] prior to LVAD implant.  LVAD assessment:  VAD Speed: 5300 RPM Flow: 4.0 Power: 3.9 w    PI: 5.7 Alarms: none Events: 5 - 15 PI events daily  Fixed speed: 5300 Low speed limit: 5000 Primary Controller: Replace back up battery in 33 months. Back up controller: Replace back up battery in 33months.  I reviewed the LVAD parameters from today and compared the results to the patient's prior recorded data. LVAD interrogation was NEGATIVE for significant power changes, NEGATIVE for clinical alarms and STABLE for PI events/speed drops. No programming changes were made and pump is  functioning within specified parameters. Pt is performing daily controller and system monitor self tests along with completing weekly and monthly maintenance for LVAD equipment.  LVAD equipment check completed and is in good working order. Back-up equipment present.   Exit Site Care:  VAD dressing and anchor removed and site care performed using sterile technique. Drive line exit site cleaned with Chlora prep applicators x 2, allowed to dry, skin protectant applied and allowed to dry before gauze dressing and Aquacel silver strip re-applied. Exit site with partial tissue ingrowth; The velour is exposed @ 1/2 inch at exit site. Small amount tan drainage with no redness, tenderness, or foul odor noted. Drive line anchor re-applied. Pt denies fever or chills. Driveline dressing is being changed daily per sterile technique by gene. Pt says they have adequate supplies at home.   Significant Events on VAD Support:   Device:  N/A  BP & Labs:  MAP 98 - Doppler is reflecting MAP  Hgb 10.1 - No S/S of bleeding. Specifically denies melena/BRBPR or nosebleeds.  LDH stable at 203 with established baseline of 200 - 290. Denies tea-colored urine. No power elevations noted on interrogation.   Patient Instructions: 1  Increase Losartan to 50 mg (two tablets daily). 2. No change in warfarin dose - keep taking one tablet on Sun, Tues, Thurs, Sat and 1/2 tablet on Mon/Wed/Fri 2. Return to VAD clinic on one week, Wednesday, June 5th at 11:00  [place]  Jeanella Anton, RN VAD Coordinator   Office: [phone] 24/7 Emergency VAD Pager: [phone]

## 2017-06-12 NOTE — Telephone Encounter (Signed)
Patient's HH company calling to report they did initial PT assessment and said "patient did very well" Patient ambulated over 200 ft easily without SOB or assistance with devices. Does not feel patient has any pressing needs at this time from PT perspective.  Ave Filter, RN

## 2017-06-12 NOTE — Progress Notes (Signed)
CSW met with patient in the clinic to confirm transportation arrangements. Patient states he came via medicaid transport from [place] and all worked out fine. CSW encouraged patient to call to make transport arrangements immediately leaving clinic to assure ride is available for the following week appointment. Patient verbalizes and states hopeful to be able to drive soon. He has a car and will be independent once he is granted permission to drive. CSW discussed LVAD Walking [facility] and patient expressed interest. CSW provided support and will continue to follow through the VAD clinic. Jackie Brennan, LCSW, CCSW-MCS [phone]   

## 2017-06-12 NOTE — Patient Instructions (Addendum)
1  Increase Losartan to 50 mg (two tablets daily). 2. No change in warfarin dose - keep taking one tablet on Sun, Tues, Thurs, Sat and 1/2 tablet on Mon/Wed/Fri 2. Return to VAD clinic on one week, Wednesday, June 5th at 11:00

## 2017-06-12 NOTE — Addendum Note (Signed)
Encounter addended by: Marcy Siren, LCSW on: 06/12/2017 4:38 PM  Actions taken: Sign clinical note

## 2017-06-15 DIAGNOSIS — Z736 Limitation of activities due to disability: Secondary | ICD-10-CM

## 2017-06-20 ENCOUNTER — Other Ambulatory Visit (HOSPITAL_COMMUNITY): Payer: Self-pay | Admitting: Unknown Physician Specialty

## 2017-06-20 ENCOUNTER — Ambulatory Visit (HOSPITAL_COMMUNITY)
Admission: RE | Admit: 2017-06-20 | Discharge: 2017-06-20 | Disposition: A | Discharge status: Home / Self Care | Payer: Medicaid Other | Source: Ambulatory Visit | Attending: Internal Medicine | Admitting: Internal Medicine

## 2017-06-20 ENCOUNTER — Ambulatory Visit (HOSPITAL_COMMUNITY): Payer: Self-pay | Admitting: Pharmacist

## 2017-06-20 VITALS — BP 106/64 | HR 85 | Ht 72.0 in | Wt 198.4 lb

## 2017-06-20 DIAGNOSIS — I429 Cardiomyopathy, unspecified: Secondary | ICD-10-CM | POA: Insufficient documentation

## 2017-06-20 DIAGNOSIS — Z7982 Long term (current) use of aspirin: Secondary | ICD-10-CM | POA: Insufficient documentation

## 2017-06-20 DIAGNOSIS — I5042 Chronic combined systolic (congestive) and diastolic (congestive) heart failure: Secondary | ICD-10-CM | POA: Insufficient documentation

## 2017-06-20 DIAGNOSIS — N183 Chronic kidney disease, stage 3 (moderate): Secondary | ICD-10-CM | POA: Insufficient documentation

## 2017-06-20 DIAGNOSIS — Z8249 Family history of ischemic heart disease and other diseases of the circulatory system: Secondary | ICD-10-CM | POA: Insufficient documentation

## 2017-06-20 DIAGNOSIS — Z95811 Presence of heart assist device: Secondary | ICD-10-CM

## 2017-06-20 DIAGNOSIS — Z7901 Long term (current) use of anticoagulants: Secondary | ICD-10-CM | POA: Diagnosis not present

## 2017-06-20 DIAGNOSIS — E1122 Type 2 diabetes mellitus with diabetic chronic kidney disease: Secondary | ICD-10-CM | POA: Diagnosis not present

## 2017-06-20 DIAGNOSIS — Z79899 Other long term (current) drug therapy: Secondary | ICD-10-CM | POA: Insufficient documentation

## 2017-06-20 DIAGNOSIS — Z87891 Personal history of nicotine dependence: Secondary | ICD-10-CM | POA: Insufficient documentation

## 2017-06-20 DIAGNOSIS — I130 Hypertensive heart and chronic kidney disease with heart failure and stage 1 through stage 4 chronic kidney disease, or unspecified chronic kidney disease: Secondary | ICD-10-CM | POA: Diagnosis not present

## 2017-06-20 DIAGNOSIS — E039 Hypothyroidism, unspecified: Secondary | ICD-10-CM | POA: Insufficient documentation

## 2017-06-20 DIAGNOSIS — I5022 Chronic systolic (congestive) heart failure: Secondary | ICD-10-CM

## 2017-06-20 DIAGNOSIS — I480 Paroxysmal atrial fibrillation: Secondary | ICD-10-CM

## 2017-06-20 LAB — BASIC METABOLIC PANEL
Anion gap: 8 (ref 5–15)
BUN: 14 mg/dL (ref 6–20)
CO2: 25 mmol/L (ref 22–32)
Calcium: 9.4 mg/dL (ref 8.9–10.3)
Chloride: 107 mmol/L (ref 101–111)
Creatinine, Ser: 1.15 mg/dL (ref 0.61–1.24)
GFR calc Af Amer: >60 mL/min (ref >60)
GFR calc non Af Amer: >60 mL/min (ref >60)
Glucose, Bld: 145 mg/dL — ABNORMAL HIGH (ref 65–99)
Potassium: 4.3 mmol/L (ref 3.5–5.1)
Sodium: 140 mmol/L (ref 135–145)

## 2017-06-20 LAB — CBC
HCT: 32.7 % — ABNORMAL LOW (ref 39.0–52.0)
Hemoglobin: 10.2 g/dL — ABNORMAL LOW (ref 13.0–17.0)
MCH: 26.6 pg (ref 26.0–34.0)
MCHC: 31.2 g/dL (ref 30.0–36.0)
MCV: 85.2 fL (ref 78.0–100.0)
Platelets: 307 10*3/uL (ref 150–400)
RBC: 3.84 MIL/uL — ABNORMAL LOW (ref 4.22–5.81)
RDW: 17.4 % — ABNORMAL HIGH (ref 11.5–15.5)
WBC: 7.1 10*3/uL (ref 4.0–10.5)

## 2017-06-20 LAB — LACTATE DEHYDROGENASE: LDH: 198 U/L — ABNORMAL HIGH (ref 98–192)

## 2017-06-20 LAB — PROTIME-INR
INR: 1.55
Prothrombin Time: 18.4 seconds — ABNORMAL HIGH (ref 11.4–15.2)

## 2017-06-20 MED ORDER — ENOXAPARIN SODIUM 40 MG/0.4ML ~~LOC~~ SOLN: 40.0000 mg | Freq: Two times a day (BID) | SUBCUTANEOUS | 1 refills | Status: DC

## 2017-06-20 NOTE — Progress Notes (Signed)
LVAD Clinic Note   Primary Cardiologist: Dr. Gala Romney   HPI: Benjamin Sherman is a 47 y.o. 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 ECHO showed severely reduced EF. 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 Placed on steroids without benefit.  Admitted 11/18 after fall/syncopal episode resulting in bifrontal SAH. Neurosurgery consulted.   Admitted 4/19 with cardiogenic shock in setting of AFL. Supported with inotropes and underwent DC-CV but had persistent shock. After numerous discussions about concern for adequate social support underwent placement of HM-3 LVAD on 05/15/17  Postop course complicated by persistent fevers and leukocytosis due to unknown source and and subtherapeutic INR. Blood cultures were negative. CT of chest, abdomen, and jaw all negative for infection.  ID was consulted. Completed antibiotic course with vanc, zosyn, diflucan, and meropenum. He remained afebrile off antibiotics x 48 hours prior to discharge. D/c home 06/04/17   Returns for routine VAD f/u. Says he feels very good. Getting stronger every day. Able to walk and do all ADLs without problem. No CP or SOB. No edema. Denies orthopnea or PND. No fevers, chills or problems with driveline. No bleeding, melena or neuro symptoms. No VAD alarms. Taking all meds as prescribed. Working on getting dentures.    VAD Indication: Destination Therapy - due to limited social support. Pt discussed with Dr. Edwena Blow at [facility] prior to LVAD implant.  LVAD assessment:  VAD Speed: 5300 RPM Flow: 4.0 Power: 4.0 w PI: 4.9 Alarms: none Events: 5 - 15 PI events daily  Fixed speed: 5300 Low speed limit: 5000 Primary Controller: Replace back up battery in10months. Back up controller: Replace back up battery in 32months.     Review of systems  complete and found to be negative unless listed in HPI.    Past Medical History:  Diagnosis Date  . CHF (congestive heart failure) (HCC)   . Diabetes mellitus without complication (HCC)     Current Outpatient Medications  Medication Sig Dispense Refill  . allopurinol (ZYLOPRIM) 100 MG tablet Take 1 tablet (100 mg total) by mouth daily. 30 tablet 0  . amiodarone (PACERONE) 200 MG tablet Take 1 tablet (200 mg total) by mouth daily. 30 tablet 6  . aspirin 81 MG EC tablet Take 1 tablet (81 mg total) by mouth daily. 30 tablet 6  . ferrous fumarate-b12-vitamic C-folic acid (TRINSICON / FOLTRIN) capsule Take 1 capsule by mouth 2 (two) times daily. 60 capsule 6  . furosemide (LASIX) 40 MG tablet Take 1 tablet (40 mg total) by mouth daily. 30 tablet 2  . gabapentin (NEURONTIN) 300 MG capsule Take 1 capsule (300 mg total) by mouth daily. 30 capsule 6  . losartan (COZAAR) 25 MG tablet Take 2 tablets (50 mg total) by mouth at bedtime. (Patient taking differently: Take 25 mg by mouth at bedtime. ) 60 tablet 6  . pantoprazole (PROTONIX) 40 MG tablet Take 1 tablet (40 mg total) by mouth daily. 30 tablet 6  . potassium chloride SA (K-DUR,KLOR-CON) 20 MEQ tablet Take 2 tablets (40 mEq total) by mouth daily. 60 tablet 6  . warfarin (COUMADIN) 6 MG tablet Take one tablet on Sun, Tues, Thurs, and Sat. Take 1/2 tab on Mon/Wed/Fri 30 tablet 6  . bisacodyl (DULCOLAX) 5 MG EC tablet Take 2 tablets (10 mg total) by mouth daily. (Patient not taking: Reported on 06/20/2017) 30 tablet 0  .  docusate sodium (COLACE) 100 MG capsule Take 2 capsules (200 mg total) by mouth daily. (Patient not taking: Reported on 06/20/2017) 10 capsule 0  . enoxaparin (LOVENOX) 40 MG/0.4ML injection Inject 0.4 mLs (40 mg total) into the skin every 12 (twelve) hours for 5 days. 10 Syringe 1  . ondansetron (ZOFRAN) 4 MG tablet Take 1 tablet (4 mg total) by mouth every 8 (eight) hours as needed for nausea or vomiting. (Patient not taking: Reported on  06/12/2017) 12 tablet 0  . traMADol (ULTRAM) 50 MG tablet Take 1 tablet (50 mg total) by mouth every 4 (four) hours as needed for moderate pain. (Patient not taking: Reported on 06/08/2017) 14 tablet 0   No current facility-administered medications for this encounter.   He is not taking any medications.    Allergies  Allergen Reactions  . Bee Venom     UNSPECIFIED REACTION       Social History   Socioeconomic History  . Marital status: Single    Spouse name: Not on file  . Number of children: Not on file  . Years of education: Not on file  . Highest education level: Not on file  Occupational History  . Occupation: Diplomatic Services operational officer Needs  . Financial resource strain: Not on file  . Food insecurity:    Worry: Not on file    Inability: Not on file  . Transportation needs:    Medical: Not on file    Non-medical: Not on file  Tobacco Use  . Smoking status: Former Smoker    Types: Cigars    Last attempt to quit: 11/16/2016    Years since quitting: 0.5  . Smokeless tobacco: Never Used  . Tobacco comment: 6 cigars per wk  Substance and Sexual Activity  . Alcohol use: Yes    Alcohol/week: 8.4 oz    Types: 12 Cans of beer, 2 Shots of liquor per week    Comment: occ  . Drug use: No  . Sexual activity: Not on file  Lifestyle  . Physical activity:    Days per week: Not on file    Minutes per session: Not on file  . Stress: Not on file  Relationships  . Social connections:    Talks on phone: Not on file    Gets together: Not on file    Attends religious service: Not on file    Active member of club or organization: Not on file    Attends meetings of clubs or organizations: Not on file    Relationship status: Not on file  . Intimate partner violence:    Fear of current or ex partner: Not on file    Emotionally abused: Not on file    Physically abused: Not on file    Forced sexual activity: Not on file  Other Topics Concern  . Not on file  Social History Narrative  .  Not on file      Family History  Problem Relation Age of Onset  . CAD Father     Vitals:   06/20/17 1114 06/20/17 1115  BP: (!) 92/0 106/64  Pulse:  85  Weight:  198 lb 6.4 oz (90 kg)  Height:  6' (1.829 m)    Wt Readings from Last 3 Encounters:  06/20/17 198 lb 6.4 oz (90 kg)  06/12/17 195 lb 6.4 oz (88.6 kg)  06/08/17 207 lb (93.9 kg)     Vital Signs:  Doppler Pressure:98 Automatc BP: 106/64 (85) HR: 85 SPO2: UTO  Weight: 198.4 lb w/o eqt Last weight: 195.4 lb  PHYSICAL EXAM: General:  NAD.  HEENT: normal x edentulous  Neck: supple. JVP not elevated.  Carotids 2+ bilat; no bruits. No lymphadenopathy or thryomegaly appreciated. Cor: LVAD hum.  Lungs: Clear. Abdomen: soft, nontender, non-distended. No hepatosplenomegaly. No bruits or masses. Good bowel sounds. Driveline site clean. Anchor in place.  Extremities: no cyanosis, clubbing, rash. Warm no edema  Neuro: alert & oriented x 3. No focal deficits. Moves all 4 without problem   ASSESSMENT & PLAN:  1. Chronic Combined Systolic/Diastolic Heart Failure. NICM  - ECHO 4/19 EF 20% CMRI EF 22%  - s/p HM-3 LVAD on 05/15/17 - He is doing very well - NYHA II - Volume status looks good. Continue lasix 40 daily  2. VAD management - VAD interrogated personally. Parameters stable. - MAPs improved. Continue losartan 50 daily. - LDH 198 - INR 1.55 Discussed dosing with PharmD personally. Will start 1/2 dose lovenox. Increase warfarin  3. AFL - Maintain NSR on amio 200 daily. Will continue for now. Carmelia Roller stop soon.    4. HTN - MAPs improved. Continue losartan 50 daily.  5. Hypothyroidism - Check TFTs next visit.   6. DMII - Needs to establish with community health and wellness as planned.   Total time spent 35 minutes. Over half that time spent discussing above.   [name], MD 06/20/17

## 2017-06-20 NOTE — Progress Notes (Signed)
Patient presents for 1 week follow up in VAD Clinic today. His sister brought him today, she opted to stay in the waiting room. Reports no problems with VAD equipment or concerns with drive line.    Vital Signs:  Doppler Pressure: 98 Automatc BP: 106/64 (85) HR: 85 SPO2: UTO  Weight: 198.4 lb w/o eqt Last weight: 195.4 lb  VAD Indication: Destination Therapy - due to limited social support. Pt discussed with Dr. Edwena Blow at [facility] prior to LVAD implant.  LVAD assessment:  VAD Speed: 5300 RPM Flow: 4.0 Power: 4.0 w    PI: 4.9 Alarms: none Events: 5 - 15 PI events daily  Fixed speed: 5300 Low speed limit: 5000 Primary Controller: Replace back up battery in 32 months. Back up controller: Replace back up battery in 17months.  I reviewed the LVAD parameters from today and compared the results to the patient's prior recorded data. LVAD interrogation was NEGATIVE for significant power changes, NEGATIVE for clinical alarms and STABLE for PI events/speed drops. No programming changes were made and pump is functioning within specified parameters. Pt is performing daily controller and system monitor self tests along with completing weekly and monthly maintenance for LVAD equipment.  LVAD equipment check completed and is in good working order. Back-up equipment present.   Exit Site Care:  VAD dressing and anchor removed and site care performed using sterile technique. Drive line exit site cleaned with Chlora prep applicators x 2, allowed to dry, skin protectant applied and allowed to dry before gauze dressing and Aquacel silver strip re-applied. Exit site with partial tissue ingrowth; The velour is exposed @ 1/2 inch at exit site. Small amount yellow drainage with no redness, tenderness, or foul odor noted. Drive line anchor re-applied. Pt denies fever or chills. Driveline dressing is being changed daily per sterile technique by gene. Pt given a bottle of silver at his visit  today.      Significant Events on VAD Support:   Device:  N/A  BP & Labs:  MAP 85 - Doppler is reflecting MAP  Hgb 10.2 - No S/S of bleeding. Specifically denies melena/BRBPR or nosebleeds.  LDH stable at 198 with established baseline of 200 - 290. Denies tea-colored urine. No power elevations noted on interrogation.   Patient Instructions: 1. Start Lovenox injections 40 mg q12. 2. May advance dressing changes to qod.  3. HH nurse will do fingerstick on Friday and page VAD pager with results.   4. Return to VAD clinic in one week, Thursday, June 13th at 09:00  [name], RN VAD Coordinator   Office: [phone] 24/7 Emergency VAD Pager: [phone]

## 2017-06-22 ENCOUNTER — Ambulatory Visit (HOSPITAL_COMMUNITY): Payer: Self-pay | Admitting: Pharmacist

## 2017-06-22 LAB — POCT INR: INR: 2.1 (ref 2–3)

## 2017-06-27 ENCOUNTER — Other Ambulatory Visit (HOSPITAL_COMMUNITY): Payer: Self-pay | Admitting: *Deleted

## 2017-06-27 DIAGNOSIS — E079 Disorder of thyroid, unspecified: Secondary | ICD-10-CM

## 2017-06-27 DIAGNOSIS — Z7901 Long term (current) use of anticoagulants: Secondary | ICD-10-CM

## 2017-06-27 DIAGNOSIS — I5043 Acute on chronic combined systolic (congestive) and diastolic (congestive) heart failure: Secondary | ICD-10-CM

## 2017-06-27 DIAGNOSIS — Z95811 Presence of heart assist device: Secondary | ICD-10-CM

## 2017-06-28 ENCOUNTER — Ambulatory Visit (HOSPITAL_COMMUNITY): Payer: Self-pay | Admitting: Pharmacist

## 2017-06-28 ENCOUNTER — Ambulatory Visit (HOSPITAL_COMMUNITY)
Admission: RE | Admit: 2017-06-28 | Discharge: 2017-06-28 | Disposition: A | Discharge status: Home / Self Care | Payer: Medicaid Other | Source: Ambulatory Visit | Attending: Cardiology | Admitting: Cardiology

## 2017-06-28 ENCOUNTER — Encounter (HOSPITAL_COMMUNITY): Payer: Self-pay

## 2017-06-28 VITALS — BP 108/0 | HR 87 | Ht 72.0 in | Wt 205.2 lb

## 2017-06-28 DIAGNOSIS — Z7982 Long term (current) use of aspirin: Secondary | ICD-10-CM | POA: Diagnosis not present

## 2017-06-28 DIAGNOSIS — Q752 Hypertelorism: Secondary | ICD-10-CM

## 2017-06-28 DIAGNOSIS — Z7901 Long term (current) use of anticoagulants: Secondary | ICD-10-CM | POA: Insufficient documentation

## 2017-06-28 DIAGNOSIS — Z95811 Presence of heart assist device: Secondary | ICD-10-CM | POA: Insufficient documentation

## 2017-06-28 DIAGNOSIS — Z87891 Personal history of nicotine dependence: Secondary | ICD-10-CM | POA: Diagnosis not present

## 2017-06-28 DIAGNOSIS — I10 Essential (primary) hypertension: Secondary | ICD-10-CM | POA: Diagnosis not present

## 2017-06-28 DIAGNOSIS — N182 Chronic kidney disease, stage 2 (mild): Secondary | ICD-10-CM

## 2017-06-28 DIAGNOSIS — I130 Hypertensive heart and chronic kidney disease with heart failure and stage 1 through stage 4 chronic kidney disease, or unspecified chronic kidney disease: Secondary | ICD-10-CM | POA: Insufficient documentation

## 2017-06-28 DIAGNOSIS — E8779 Other fluid overload: Secondary | ICD-10-CM

## 2017-06-28 DIAGNOSIS — E039 Hypothyroidism, unspecified: Secondary | ICD-10-CM | POA: Insufficient documentation

## 2017-06-28 DIAGNOSIS — I5043 Acute on chronic combined systolic (congestive) and diastolic (congestive) heart failure: Secondary | ICD-10-CM | POA: Diagnosis not present

## 2017-06-28 DIAGNOSIS — Z79899 Other long term (current) drug therapy: Secondary | ICD-10-CM | POA: Insufficient documentation

## 2017-06-28 DIAGNOSIS — N183 Chronic kidney disease, stage 3 (moderate): Secondary | ICD-10-CM | POA: Diagnosis not present

## 2017-06-28 DIAGNOSIS — I5042 Chronic combined systolic (congestive) and diastolic (congestive) heart failure: Secondary | ICD-10-CM | POA: Diagnosis not present

## 2017-06-28 DIAGNOSIS — E118 Type 2 diabetes mellitus with unspecified complications: Secondary | ICD-10-CM | POA: Diagnosis not present

## 2017-06-28 DIAGNOSIS — I5020 Unspecified systolic (congestive) heart failure: Secondary | ICD-10-CM | POA: Diagnosis not present

## 2017-06-28 DIAGNOSIS — I5022 Chronic systolic (congestive) heart failure: Secondary | ICD-10-CM | POA: Diagnosis not present

## 2017-06-28 DIAGNOSIS — E1122 Type 2 diabetes mellitus with diabetic chronic kidney disease: Secondary | ICD-10-CM | POA: Insufficient documentation

## 2017-06-28 DIAGNOSIS — E079 Disorder of thyroid, unspecified: Secondary | ICD-10-CM

## 2017-06-28 DIAGNOSIS — I4892 Unspecified atrial flutter: Secondary | ICD-10-CM | POA: Insufficient documentation

## 2017-06-28 DIAGNOSIS — I428 Other cardiomyopathies: Secondary | ICD-10-CM | POA: Diagnosis not present

## 2017-06-28 DIAGNOSIS — Z8249 Family history of ischemic heart disease and other diseases of the circulatory system: Secondary | ICD-10-CM | POA: Diagnosis not present

## 2017-06-28 LAB — CBC
HCT: 38.4 % — ABNORMAL LOW (ref 39.0–52.0)
Hemoglobin: 11.7 g/dL — ABNORMAL LOW (ref 13.0–17.0)
MCH: 26.8 pg (ref 26.0–34.0)
MCHC: 30.5 g/dL (ref 30.0–36.0)
MCV: 88.1 fL (ref 78.0–100.0)
Platelets: 316 10*3/uL (ref 150–400)
RBC: 4.36 MIL/uL (ref 4.22–5.81)
RDW: 19 % — ABNORMAL HIGH (ref 11.5–15.5)
WBC: 6.3 10*3/uL (ref 4.0–10.5)

## 2017-06-28 LAB — BASIC METABOLIC PANEL
Anion gap: 9 (ref 5–15)
BUN: 14 mg/dL (ref 6–20)
CO2: 27 mmol/L (ref 22–32)
Calcium: 9.5 mg/dL (ref 8.9–10.3)
Chloride: 105 mmol/L (ref 101–111)
Creatinine, Ser: 1.23 mg/dL (ref 0.61–1.24)
GFR calc Af Amer: >60 mL/min (ref >60)
GFR calc non Af Amer: >60 mL/min (ref >60)
Glucose, Bld: 147 mg/dL — ABNORMAL HIGH (ref 65–99)
Potassium: 4.6 mmol/L (ref 3.5–5.1)
Sodium: 141 mmol/L (ref 135–145)

## 2017-06-28 LAB — PROTIME-INR
INR: 3.83
Prothrombin Time: 37.4 seconds — ABNORMAL HIGH (ref 11.4–15.2)

## 2017-06-28 LAB — T4, FREE: Free T4: 1.02 ng/dL (ref 0.82–1.77)

## 2017-06-28 LAB — TSH: TSH: 5.431 u[IU]/mL — ABNORMAL HIGH (ref 0.350–4.500)

## 2017-06-28 LAB — LACTATE DEHYDROGENASE: LDH: 287 U/L — ABNORMAL HIGH (ref 98–192)

## 2017-06-28 MED ORDER — SACUBITRIL-VALSARTAN 24-26 MG PO TABS: 1.0000 | ORAL_TABLET | Freq: Two times a day (BID) | ORAL | 6 refills | Status: DC

## 2017-06-28 MED ORDER — FUROSEMIDE 40 MG PO TABS: 40.0000 mg | ORAL_TABLET | Freq: Every day | ORAL | 2 refills | Status: DC

## 2017-06-28 MED ORDER — FUROSEMIDE 40 MG PO TABS
40.0000 mg | ORAL_TABLET | Freq: Every day | ORAL | 2 refills | Status: DC
Start: 2017-06-28 — End: 2017-07-09

## 2017-06-28 NOTE — Progress Notes (Signed)
LVAD Clinic Note   Primary Cardiologist: Dr. Gala Romney   HPI: Benjamin Sherman is a 47 y.o. 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 ECHO showed severely reduced EF. 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 Placed on steroids without benefit.  Admitted 11/18 after fall/syncopal episode resulting in bifrontal SAH. Neurosurgery consulted.   Admitted 4/19 with cardiogenic shock in setting of AFL. Supported with inotropes and underwent DC-CV but had persistent shock. After numerous discussions about concern for adequate social support underwent placement of HM-3 LVAD on 05/15/17. D/c home 06/04/17  He presents today for routine VAD f/u. Doing well overall. Living with his frat brother, and had his other friend "Gene" helping with his lovenox shots last week. Says he is getting stronger every day. Was able to drive himself to visit today. Goes to [employer] and walks 6 laps around the store on rainy days. Denies bleeding, melena, or neuro symptoms. No VAD alarms. Denies SOB or CP. There is no lasix in his pill box, and he is not sure how long he has been off it.  Up 7 lbs from last visit. Denies orthopnea or PND.   Reports taking Coumadin as prescribed and adherence to anticoagulation based dietary restrictions.  Denies bright red blood per rectum or melena, no dark urine or hematuria.     VAD Indication: Destination Therapy -Due to limited social support. Pt discussed with Dr. Edwena Blow at [facility] prior to LVAD implant.  LVAD assessment:  VAD Speed: 5300 RPM Flow: 3.8 Power: 8.0 w PI: 4.8 Alarms: None  Events: 5-10 PI events daily  Fixed speed: 5300 Low speed limit: 5000 Primary Controller: Replace back up battery 32 months. Back up controller: Replace back up battery in 32 months..  Review of systems complete and  found to be negative unless listed in HPI.    Past Medical History:  Diagnosis Date  . CHF (congestive heart failure) (HCC)   . Diabetes mellitus without complication (HCC)     Current Outpatient Medications  Medication Sig Dispense Refill  . allopurinol (ZYLOPRIM) 100 MG tablet Take 1 tablet (100 mg total) by mouth daily. 30 tablet 0  . amiodarone (PACERONE) 200 MG tablet Take 1 tablet (200 mg total) by mouth daily. 30 tablet 6  . aspirin 81 MG EC tablet Take 1 tablet (81 mg total) by mouth daily. 30 tablet 6  . bisacodyl (DULCOLAX) 5 MG EC tablet Take 2 tablets (10 mg total) by mouth daily. (Patient not taking: Reported on 06/20/2017) 30 tablet 0  . docusate sodium (COLACE) 100 MG capsule Take 2 capsules (200 mg total) by mouth daily. (Patient not taking: Reported on 06/20/2017) 10 capsule 0  . enoxaparin (LOVENOX) 40 MG/0.4ML injection Inject 0.4 mLs (40 mg total) into the skin every 12 (twelve) hours for 5 days. 10 Syringe 1  . ferrous fumarate-b12-vitamic C-folic acid (TRINSICON / FOLTRIN) capsule Take 1 capsule by mouth 2 (two) times daily. 60 capsule 6  . furosemide (LASIX) 40 MG tablet Take 1 tablet (40 mg total) by mouth daily. 30 tablet 2  . gabapentin (NEURONTIN) 300 MG capsule Take 1 capsule (300 mg total) by mouth daily. 30 capsule 6  . losartan (COZAAR) 25 MG tablet Take 2 tablets (50 mg total) by mouth at bedtime. (Patient taking differently: Take 25 mg by mouth at bedtime. ) 60 tablet 6  .  ondansetron (ZOFRAN) 4 MG tablet Take 1 tablet (4 mg total) by mouth every 8 (eight) hours as needed for nausea or vomiting. (Patient not taking: Reported on 06/12/2017) 12 tablet 0  . pantoprazole (PROTONIX) 40 MG tablet Take 1 tablet (40 mg total) by mouth daily. 30 tablet 6  . potassium chloride SA (K-DUR,KLOR-CON) 20 MEQ tablet Take 2 tablets (40 mEq total) by mouth daily. 60 tablet 6  . traMADol (ULTRAM) 50 MG tablet Take 1 tablet (50 mg total) by mouth every 4 (four) hours as needed for  moderate pain. (Patient not taking: Reported on 06/08/2017) 14 tablet 0  . warfarin (COUMADIN) 6 MG tablet Take one tablet on Sun, Tues, Thurs, and Sat. Take 1/2 tab on Mon/Wed/Fri 30 tablet 6   No current facility-administered medications for this encounter.    Allergies  Allergen Reactions  . Bee Venom     UNSPECIFIED REACTION       Social History   Socioeconomic History  . Marital status: Single    Spouse name: Not on file  . Number of children: Not on file  . Years of education: Not on file  . Highest education level: Not on file  Occupational History  . Occupation: Diplomatic Services operational officer Needs  . Financial resource strain: Not on file  . Food insecurity:    Worry: Not on file    Inability: Not on file  . Transportation needs:    Medical: Not on file    Non-medical: Not on file  Tobacco Use  . Smoking status: Former Smoker    Types: Cigars    Last attempt to quit: 11/16/2016    Years since quitting: 0.6  . Smokeless tobacco: Never Used  . Tobacco comment: 6 cigars per wk  Substance and Sexual Activity  . Alcohol use: Yes    Alcohol/week: 8.4 oz    Types: 12 Cans of beer, 2 Shots of liquor per week    Comment: occ  . Drug use: No  . Sexual activity: Not on file  Lifestyle  . Physical activity:    Days per week: Not on file    Minutes per session: Not on file  . Stress: Not on file  Relationships  . Social connections:    Talks on phone: Not on file    Gets together: Not on file    Attends religious service: Not on file    Active member of club or organization: Not on file    Attends meetings of clubs or organizations: Not on file    Relationship status: Not on file  . Intimate partner violence:    Fear of current or ex partner: Not on file    Emotionally abused: Not on file    Physically abused: Not on file    Forced sexual activity: Not on file  Other Topics Concern  . Not on file  Social History Narrative  . Not on file      Family History  Problem  Relation Age of Onset  . CAD Father     Vitals:   06/28/17 0908 06/28/17 0909  BP: 117/86 (!) 108/0  Pulse: 87   SpO2: 98%   Weight: 205 lb 3.2 oz (93.1 kg)   Height: 6' (1.829 m)     Wt Readings from Last 3 Encounters:  06/28/17 205 lb 3.2 oz (93.1 kg)  06/20/17 198 lb 6.4 oz (90 kg)  06/12/17 195 lb 6.4 oz (88.6 kg)     Vital Signs:  Doppler Pressure:108 Automatc BP: 117/86 (100)  HR: 87 SPO2: 98%  Weight: 205 lbs lb w/o eqt Last weight: 198.4 lb  PHYSICAL EXAM: General:  NAD.  HEENT: normal x for edentulous Neck: supple. JVP 7-8.  Carotids 2+ bilat; no bruits. No lymphadenopathy or thryomegaly appreciated. Cor: LVAD hum.  Lungs: Clear. Abdomen: obese soft, nontender, non-distended. No hepatosplenomegaly. No bruits or masses. Good bowel sounds. Driveline site clean. Anchor in place.  Extremities: no cyanosis, clubbing, rash. Warm trace edema  Neuro: alert & oriented x 3. No focal deficits. Moves all 4 without problem    ASSESSMENT & PLAN:  1. Chronic Combined Systolic/Diastolic Heart Failure. NICM  - ECHO 4/19 EF 20% CMRI EF 22%  - s/p HM-3 LVAD on 05/15/17 - NYHA II symptoms now nearly 2 months out.  - Volume status at least mildly elevated on exam  - Stop losartan.  - Start Entresto 24/26 mg BID. BMET pending.  - Change lasix to as needed.  - Reinforced fluid restriction to < 2 L daily, sodium restriction to less than 2000 mg daily, and the importance of daily weights.    2. VAD management - VAD interrogated personally. Parameters stable.  - MAPs improved. Continue losartan 50 daily. - LDH 287. Follow next week. - INR elevated at 3.83. Discussed dosing with Pharm-D personally. Will need to cut back coumadin. Denies bleeding.  3. AFL - Maintaining NSR - Continue amio 200 mg daily for now. Likely stop soon.   4. HTN - MAPs stable on meds as above.   5. Hypothyroidism - TFTs sent today.   6. DMII - Needs to establish with community health and  wellness as planned.   Graciella Freer, PA-C 06/28/17   Patient seen and examined with the above-signed Advanced Practice Provider and/or Housestaff. I personally reviewed laboratory data, imaging studies and relevant notes. I independently examined the patient and formulated the important aspects of the plan. I have edited the note to reflect any of my changes or salient points. I have personally discussed the plan with the patient and/or family.  Overall he is much improved, Becoming more independent. Volume status and MAPs up slightly. Agree with stopping losartan and lasix. Start Entresto 24/26 bid. Take lasix as needed. Follow BP. VAD interrogated personally. Parameters stable. INR high today. Discussed dosing with PharmD personally.  [name], MD  10:47 PM

## 2017-06-28 NOTE — Progress Notes (Signed)
Patient presents for 1 week follow up in VAD Clinic today. He drove himself today. Reorts no problems with VAD equipment or concerns with drive line.   Pt reports he is feeling "better every day". Is increasing activity level daily, with recent rain, is walking 6 laps around [facility] with no limitations. Says he is no longer SOB.  Pt brought pill box, says he and his [facility] nurse have worked together to fill. He says HH will be complete next week. He also brought this medications from home as instructed. While reviewing meds, found patient did not have Lasix. He was supposed to be taking 40 mg daily, doesn't know how long he has been out of Lasix.   Contacted [facility] nurse and reviewed med changes below. Written copy sent with patient for her review as well.   Contacted Leota Sauers, Pharm D and left message re: elevated INR today. I removed coumadin dose from this evening's pillbox.   Vital Signs:  Doppler Pressure: 108 Automatc BP: 117/86 (100) HR: 87 SPO2: 98% on RA  Weight: 205.2 lb w/o eqt Last weight: 198.4 lb  VAD Indication: Destination Therapy - due to limited social support. Pt discussed with Dr. Edwena Blow at [facility] prior to LVAD implant.  LVAD assessment:  VAD Speed: 5300 RPM Flow: 3.0 Power: 8.5 w    PI: 4.0 Alarms: none Events: 5 - 10 PI events daily  Fixed speed: 5300 Low speed limit: 5000 Primary Controller: Replace back up battery in 32 months. Back up controller: Replace back up battery in 22months.  I reviewed the LVAD parameters from today and compared the results to the patient's prior recorded data. LVAD interrogation was NEGATIVE for significant power changes, NEGATIVE for clinical alarms and STABLE for PI events/speed drops. No programming changes were made and pump is functioning within specified parameters. Pt is performing daily controller and system monitor self tests along with completing weekly and monthly maintenance for LVAD equipment.  LVAD  equipment check completed and is in good working order. Back-up equipment present.   Exit Site Care:  VAD dressing and anchor removed and site care performed using sterile technique. Drive line exit site cleaned with Chlora prep applicators x 2, allowed to dry, skin protectant applied and allowed to dry before gauze dressing and Aquacel silver strip re-applied. Exit site with partial tissue ingrowth; The velour is exposed @ 1/2 inch at exit site. Small amount yellow drainage with no redness, tenderness, or foul odor noted. Drive line anchor re-applied. Pt denies fever or chills. Driveline dressing is being changed every other day per sterile technique by gene. Will continue every other day dressing change for now. Pt has adequate dressing supplies at home.   Significant Events on VAD Support:   Device:  N/A  BP & Labs:  MAP 108 - Doppler is reflecting MAP  Hgb  11.7 - No S/S of bleeding. Specifically denies melena/BRBPR or nosebleeds.  LDH stable at 287 with established baseline of 200 - 290. Denies tea-colored urine. No power elevations noted on interrogation.   Patient Instructions: 1. Stop Losartan 2. Start Entresto 24/26 mg twice daily 3. Take Lasix 40 mg as needed. Take one dose today. 4. Hold coumadin today, Misty Stanley will call you with further dosing instructions. 5. Continue every other day dressing changes. 6. Return to VAD clinic in one week.  [name] RN VAD Coordinator   Office: [phone] 24/7 Emergency VAD Pager: [phone]

## 2017-06-28 NOTE — Patient Instructions (Signed)
1. Stop Losartan 2. Start Entresto 24/26 mg twice daily 3. Take Lasix 40 mg as needed. Take one dose today. 4. Hold coumadin today, Misty Stanley will call you with further dosing instructions. 5. Continue every other day dressing changes. 6. Return to VAD clinic in one week.

## 2017-06-29 ENCOUNTER — Other Ambulatory Visit (HOSPITAL_COMMUNITY): Payer: Self-pay | Admitting: Unknown Physician Specialty

## 2017-06-29 LAB — T3, FREE: T3, Free: 2.3 pg/mL (ref 2.0–4.4)

## 2017-06-29 MED ORDER — ALLOPURINOL 100 MG PO TABS: 100.0000 mg | ORAL_TABLET | Freq: Every day | ORAL | 0 refills | Status: DC

## 2017-06-29 MED ORDER — PANTOPRAZOLE SODIUM 40 MG PO TBEC: 40.0000 mg | DELAYED_RELEASE_TABLET | Freq: Every day | ORAL | 6 refills | Status: DC

## 2017-06-29 MED ORDER — ASPIRIN 81 MG PO TBEC: 81.0000 mg | DELAYED_RELEASE_TABLET | Freq: Every day | ORAL | 6 refills | Status: DC

## 2017-06-29 MED ORDER — AMIODARONE HCL 200 MG PO TABS: 200.0000 mg | ORAL_TABLET | Freq: Every day | ORAL | 6 refills | Status: DC

## 2017-06-29 NOTE — Addendum Note (Signed)
Encounter addended by: Lebron Quam, RN on: 06/29/2017 5:30 PM  Actions taken: Pharmacy for encounter modified, Order list changed

## 2017-07-04 ENCOUNTER — Other Ambulatory Visit (HOSPITAL_COMMUNITY): Payer: Self-pay | Admitting: Unknown Physician Specialty

## 2017-07-04 ENCOUNTER — Ambulatory Visit (HOSPITAL_COMMUNITY)
Admission: RE | Admit: 2017-07-04 | Discharge: 2017-07-04 | Disposition: A | Discharge status: Home / Self Care | Payer: Medicaid Other | Source: Ambulatory Visit | Attending: Cardiology | Admitting: Cardiology

## 2017-07-04 ENCOUNTER — Ambulatory Visit (HOSPITAL_COMMUNITY): Payer: Self-pay | Admitting: Pharmacist

## 2017-07-04 VITALS — BP 110/75 | HR 89 | Ht 72.0 in | Wt 200.8 lb

## 2017-07-04 DIAGNOSIS — I130 Hypertensive heart and chronic kidney disease with heart failure and stage 1 through stage 4 chronic kidney disease, or unspecified chronic kidney disease: Secondary | ICD-10-CM | POA: Diagnosis not present

## 2017-07-04 DIAGNOSIS — I5042 Chronic combined systolic (congestive) and diastolic (congestive) heart failure: Secondary | ICD-10-CM | POA: Diagnosis not present

## 2017-07-04 DIAGNOSIS — I429 Cardiomyopathy, unspecified: Secondary | ICD-10-CM | POA: Diagnosis not present

## 2017-07-04 DIAGNOSIS — Z95811 Presence of heart assist device: Secondary | ICD-10-CM | POA: Diagnosis not present

## 2017-07-04 DIAGNOSIS — Z7982 Long term (current) use of aspirin: Secondary | ICD-10-CM | POA: Insufficient documentation

## 2017-07-04 DIAGNOSIS — N183 Chronic kidney disease, stage 3 (moderate): Secondary | ICD-10-CM | POA: Insufficient documentation

## 2017-07-04 DIAGNOSIS — E039 Hypothyroidism, unspecified: Secondary | ICD-10-CM | POA: Insufficient documentation

## 2017-07-04 DIAGNOSIS — I4892 Unspecified atrial flutter: Secondary | ICD-10-CM | POA: Diagnosis not present

## 2017-07-04 DIAGNOSIS — Z7901 Long term (current) use of anticoagulants: Secondary | ICD-10-CM | POA: Diagnosis not present

## 2017-07-04 DIAGNOSIS — Z8249 Family history of ischemic heart disease and other diseases of the circulatory system: Secondary | ICD-10-CM | POA: Insufficient documentation

## 2017-07-04 DIAGNOSIS — E1122 Type 2 diabetes mellitus with diabetic chronic kidney disease: Secondary | ICD-10-CM | POA: Diagnosis not present

## 2017-07-04 DIAGNOSIS — Z87891 Personal history of nicotine dependence: Secondary | ICD-10-CM | POA: Insufficient documentation

## 2017-07-04 DIAGNOSIS — Z7902 Long term (current) use of antithrombotics/antiplatelets: Secondary | ICD-10-CM | POA: Insufficient documentation

## 2017-07-04 DIAGNOSIS — I480 Paroxysmal atrial fibrillation: Secondary | ICD-10-CM | POA: Diagnosis not present

## 2017-07-04 DIAGNOSIS — I5022 Chronic systolic (congestive) heart failure: Secondary | ICD-10-CM

## 2017-07-04 DIAGNOSIS — Z79899 Other long term (current) drug therapy: Secondary | ICD-10-CM | POA: Insufficient documentation

## 2017-07-04 DIAGNOSIS — I10 Essential (primary) hypertension: Secondary | ICD-10-CM

## 2017-07-04 LAB — BASIC METABOLIC PANEL
Anion gap: 10 (ref 5–15)
BUN: 22 mg/dL — ABNORMAL HIGH (ref 6–20)
CO2: 25 mmol/L (ref 22–32)
Calcium: 9.4 mg/dL (ref 8.9–10.3)
Chloride: 104 mmol/L (ref 101–111)
Creatinine, Ser: 1.27 mg/dL — ABNORMAL HIGH (ref 0.61–1.24)
GFR calc Af Amer: >60 mL/min (ref >60)
GFR calc non Af Amer: >60 mL/min (ref >60)
Glucose, Bld: 159 mg/dL — ABNORMAL HIGH (ref 65–99)
Potassium: 4 mmol/L (ref 3.5–5.1)
Sodium: 139 mmol/L (ref 135–145)

## 2017-07-04 LAB — CBC
HCT: 37.6 % — ABNORMAL LOW (ref 39.0–52.0)
Hemoglobin: 11.5 g/dL — ABNORMAL LOW (ref 13.0–17.0)
MCH: 27.1 pg (ref 26.0–34.0)
MCHC: 30.6 g/dL (ref 30.0–36.0)
MCV: 88.7 fL (ref 78.0–100.0)
Platelets: 272 10*3/uL (ref 150–400)
RBC: 4.24 MIL/uL (ref 4.22–5.81)
RDW: 19.5 % — ABNORMAL HIGH (ref 11.5–15.5)
WBC: 5.5 10*3/uL (ref 4.0–10.5)

## 2017-07-04 LAB — PROTIME-INR
INR: 3.84
Prothrombin Time: 37.5 seconds — ABNORMAL HIGH (ref 11.4–15.2)

## 2017-07-04 LAB — LACTATE DEHYDROGENASE: LDH: 225 U/L — ABNORMAL HIGH (ref 98–192)

## 2017-07-04 LAB — POCT INR: INR: 3.8 — AB (ref 2–3)

## 2017-07-04 MED ORDER — LOSARTAN POTASSIUM 25 MG PO TABS: 25.0000 mg | ORAL_TABLET | Freq: Every day | ORAL | 3 refills | Status: DC

## 2017-07-04 NOTE — Progress Notes (Signed)
Patient presents for 1 week follow up in VAD Clinic today. He drove himself today. Reports no problems with VAD equipment or concerns with drive line.   Pt reports he is feeling "better every day". Is increasing activity level daily, with recent rain, is walking 6 laps around [facility] with no limitations. Says he is no longer SOB.  Pt brought pill box, says he and his [facility] nurse have worked together to fill.  He also brought this medications from home as instructed. While reviewing meds, found patient did not have Entresto. He was started on Entresto last week. He paged me Friday evening while at the pharmacy, he was supposed to be getting this filled. Per pharmacy the entresto was not filled because it was requiring a prior auth. They will run the insurance and will contact us if the med requires prior auth. I have given the pt a 30 day supply of Entresto in clinic today.   Contacted [facility] nurse and reviewed med changes below. Written copy sent with patient for her review as well.   Viviann Spare, Pharm D came to bedside and filled pts pillbox for the week.  Vital Signs:  Doppler Pressure: 102 Automatc BP: 110/75 (97) HR: 89 SPO2: 99% on RA  Weight: 200.8 lb w/o eqt Last weight: 205.2 lb  VAD Indication: Destination Therapy - due to limited social support. Pt discussed with Dr. Edwena Blow at [facility] prior to LVAD implant.  LVAD assessment:  VAD Speed: 5300 RPM Flow: 3.6 Power: 4.0 w    PI: 6.5 Alarms: none Events: 10 - 15PI events daily  Fixed speed: 5300 Low speed limit: 5000 Primary Controller: Replace back up battery in 32 months. Back up controller: Replace back up battery in 64months.  I reviewed the LVAD parameters from today and compared the results to the patient's prior recorded data. LVAD interrogation was NEGATIVE for significant power changes, NEGATIVE for clinical alarms and STABLE for PI events/speed drops. No programming changes were made and pump is  functioning within specified parameters. Pt is performing daily controller and system monitor self tests along with completing weekly and monthly maintenance for LVAD equipment.  LVAD equipment check completed and is in good working order. Back-up equipment present.   Exit Site Care:  VAD dressing and anchor removed and site care performed using sterile technique. Drive line exit site cleaned with Chlora prep applicators x 2, allowed to dry, skin protectant applied and allowed to dry before gauze dressing and Aquacel silver strip re-applied. Exit site with partial tissue ingrowth; The velour is exposed @ 1/2 inch at exit site. Small amount clear drainage with no redness, tenderness, or foul odor noted. Drive line anchor re-applied. Pt denies fever or chills. Driveline dressing is being changed every other day per sterile technique by gene. Will continue every other day dressing change for now.        Significant Events on VAD Support:   Device:  N/A  BP & Labs:  MAP 102 - Doppler is reflecting MAP  Hgb  11.5 - No S/S of bleeding. Specifically denies melena/BRBPR or nosebleeds.  LDH stable at 225 with established baseline of 200 - 290. Denies tea-colored urine. No power elevations noted on interrogation.   Dr. Gala Romney reviewed thyroid labs in clinic today. No changes made to medications.  Patient Instructions: 1. Start Entresto 24-26 twice daily.  2. Hold coumadin today, Take 1/2 tablet daily. 3. Continue every other day dressing changes. 4. Return to VAD clinic on Monday at  1000.  [name] RN VAD Coordinator   Office: [phone] 24/7 Emergency VAD Pager: [phone]

## 2017-07-04 NOTE — Patient Instructions (Addendum)
1. Hold Coumadin tonight. Pillbox filled for remainder. 2. Start Entresto 24-26 twice daily. 3. Return to clinic on Monday 6/24 @ 1000am.

## 2017-07-04 NOTE — Progress Notes (Signed)
LVAD Clinic Note   Primary Cardiologist: Dr. Gala Romney   HPI: Benjamin Sherman is a 47 y.o. 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 ECHO showed severely reduced EF. 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 Placed on steroids without benefit.  Admitted 11/18 after fall/syncopal episode resulting in bifrontal SAH. Neurosurgery consulted.   Admitted 4/19 with cardiogenic shock in setting of AFL. Supported with inotropes and underwent DC-CV but had persistent shock. After numerous discussions about concern for adequate social support underwent placement of HM-3 LVAD on 05/15/17. D/c home 06/04/17  He presents today for routine VAD f/u. At last visit we had him stop his lasix and losartan and switched to Entresto 24/26 bid. It appears that he did not pick up the [facility]. He is also confused about his meds and is not sure how often he is taking lasix or if he is taking losartan. Otherwise he says he feels great. Getting stronger every day. Walked 6 laps around [place] without any difficulty. No CP, SOB, edema, orthopnea or PND. No fevers, chills or problems with driveline. No bleeding, melena or neuro symptoms. No VAD alarms.  Reports taking Coumadin as prescribed and adherence to anticoagulation based dietary restrictions.  Denies bright red blood per rectum or melena, no dark urine or hematuria.     VAD Indication: Destination Therapy - due to limited social support. Pt discussed with Dr. Edwena Blow at [facility] prior to LVAD implant.  LVAD assessment:  VAD Speed: 5300 RPM Flow: 3.6 Power: 4.0 w PI: 6.5 Alarms: none Events: 10 - 15PI events daily  Fixed speed: 5300 Low speed limit: 5000 Primary Controller: Replace back up battery in66months. Back up controller: Replace back up battery in 21months.    Review of  systems complete and found to be negative unless listed in HPI.    Past Medical History:  Diagnosis Date  . CHF (congestive heart failure) (HCC)   . Diabetes mellitus without complication (HCC)     Current Outpatient Medications  Medication Sig Dispense Refill  . allopurinol (ZYLOPRIM) 100 MG tablet Take 1 tablet (100 mg total) by mouth daily. 30 tablet 0  . amiodarone (PACERONE) 200 MG tablet Take 1 tablet (200 mg total) by mouth daily. 30 tablet 6  . aspirin 81 MG EC tablet Take 1 tablet (81 mg total) by mouth daily. 30 tablet 6  . ferrous fumarate-b12-vitamic C-folic acid (TRINSICON / FOLTRIN) capsule Take 1 capsule by mouth 2 (two) times daily. 60 capsule 6  . furosemide (LASIX) 40 MG tablet Take 1 tablet (40 mg total) by mouth daily. 30 tablet 2  . gabapentin (NEURONTIN) 300 MG capsule Take 1 capsule (300 mg total) by mouth daily. 30 capsule 6  . omeprazole (PRILOSEC) 40 MG capsule Take 40 mg by mouth daily.    Marland Kitchen warfarin (COUMADIN) 6 MG tablet Take one tablet on Sun, Tues, Thurs, and Sat. Take 1/2 tab on Mon/Wed/Fri 30 tablet 6  . enoxaparin (LOVENOX) 40 MG/0.4ML injection Inject 0.4 mLs (40 mg total) into the skin every 12 (twelve) hours for 5 days. 10 Syringe 1  . losartan (COZAAR) 25 MG tablet Take 1 tablet (25 mg total) by mouth daily. 90 tablet 3  . potassium chloride SA (K-DUR,KLOR-CON) 20 MEQ tablet Take 2 tablets (40 mEq total) by mouth daily. (Patient not taking: Reported on 07/04/2017) 60 tablet 6  .  sacubitril-valsartan (ENTRESTO) 24-26 MG Take 1 tablet by mouth 2 (two) times daily. (Patient not taking: Reported on 07/04/2017) 60 tablet 6   No current facility-administered medications for this encounter.    Allergies  Allergen Reactions  . Bee Venom     UNSPECIFIED REACTION       Social History   Socioeconomic History  . Marital status: Single    Spouse name: Not on file  . Number of children: Not on file  . Years of education: Not on file  . Highest education  level: Not on file  Occupational History  . Occupation: Diplomatic Services operational officer Needs  . Financial resource strain: Not on file  . Food insecurity:    Worry: Not on file    Inability: Not on file  . Transportation needs:    Medical: Not on file    Non-medical: Not on file  Tobacco Use  . Smoking status: Former Smoker    Types: Cigars    Last attempt to quit: 11/16/2016    Years since quitting: 0.6  . Smokeless tobacco: Never Used  . Tobacco comment: 6 cigars per wk  Substance and Sexual Activity  . Alcohol use: Yes    Alcohol/week: 8.4 oz    Types: 12 Cans of beer, 2 Shots of liquor per week    Comment: occ  . Drug use: No  . Sexual activity: Not on file  Lifestyle  . Physical activity:    Days per week: Not on file    Minutes per session: Not on file  . Stress: Not on file  Relationships  . Social connections:    Talks on phone: Not on file    Gets together: Not on file    Attends religious service: Not on file    Active member of club or organization: Not on file    Attends meetings of clubs or organizations: Not on file    Relationship status: Not on file  . Intimate partner violence:    Fear of current or ex partner: Not on file    Emotionally abused: Not on file    Physically abused: Not on file    Forced sexual activity: Not on file  Other Topics Concern  . Not on file  Social History Narrative  . Not on file      Family History  Problem Relation Age of Onset  . CAD Father     Vitals:   07/04/17 0944 07/04/17 0945  BP: (!) 102/0 110/75  Pulse: 89   SpO2: 99%   Weight: 200 lb 12.8 oz (91.1 kg)   Height: 6' (1.829 m)     Wt Readings from Last 3 Encounters:  07/04/17 200 lb 12.8 oz (91.1 kg)  06/28/17 205 lb 3.2 oz (93.1 kg)  06/20/17 198 lb 6.4 oz (90 kg)     Vital Signs:  Doppler Pressure:102 Automatc BP: 110/75 (97) HR: 89 SPO2: 99% on RA  Weight: 200.8 lb w/o eqt Last weight: 205.2 lb  PHYSICAL EXAM: General:  NAD.  HEENT: normal x  for edentulous Neck: supple. JVP not elevated.  Carotids 2+ bilat; no bruits. No lymphadenopathy or thryomegaly appreciated. Cor: LVAD hum.  Lungs: Clear. Abdomen:soft, nontender, non-distended. No hepatosplenomegaly. No bruits or masses. Good bowel sounds. Driveline site clean. Anchor in place.  Extremities: no cyanosis, clubbing, rash. Warm no edema  Neuro: alert & oriented x 3. No focal deficits. Moves all 4 without problem    ASSESSMENT & PLAN:  1. Chronic  Combined Systolic/Diastolic Heart Failure. NICM  - ECHO 4/19 EF 20% CMRI EF 22%  - s/p HM-3 LVAD on 05/15/17 - Continues to progress. Now NYHA I with VAD support. Volume status looks good - Long discussion about his medications today. Viviann Spare PharmD came to bedside and helped explain meds and fill pill box.  - We went back and forth about starting Entresto due to problems with compliance. We finally agreed to try Entresto 24/26 bid - We were very clear to stop losartan and take lasix only AS NEEDED - Reinforced fluid restriction to < 2 L daily, sodium restriction to less than 2000 mg daily, and the importance of daily weights.    2. VAD management - VAD interrogated personally. Parameters stable. - MAPs still up. Switching to [facility] as above - LDH 225.  - INR elevated at 3.84. Discussed dosing with PharmD personally.  3. AFL - Maintaining NSR - Continue amio 200 mg daily for now. Likely stop soon.   4. HTN - MAPs elevated. Switching losartan to Entresto   5. Hypothyroidism - TFTs ok  6. DMII - Needs to establish with community health and wellness as planned.   Total time spent 45 minutes. Over half that time spent discussing above.   [name], MD 07/04/17

## 2017-07-04 NOTE — Progress Notes (Signed)
Opened in error

## 2017-07-06 ENCOUNTER — Telehealth (HOSPITAL_COMMUNITY): Payer: Self-pay | Admitting: *Deleted

## 2017-07-06 NOTE — Telephone Encounter (Signed)
Entresto PA submitted today through [employer].  Conf# [id number] W.  Will wait for approval.  Also per previous OV note patient instructed to stop Losartan.  Medication discontinued off MAR.

## 2017-07-09 ENCOUNTER — Other Ambulatory Visit (HOSPITAL_COMMUNITY): Payer: Self-pay | Admitting: *Deleted

## 2017-07-09 ENCOUNTER — Ambulatory Visit (HOSPITAL_COMMUNITY)
Admission: RE | Admit: 2017-07-09 | Discharge: 2017-07-09 | Disposition: A | Discharge status: Home / Self Care | Payer: Medicaid Other | Source: Ambulatory Visit | Attending: Internal Medicine | Admitting: Internal Medicine

## 2017-07-09 ENCOUNTER — Ambulatory Visit (HOSPITAL_COMMUNITY): Payer: Self-pay | Admitting: Pharmacist

## 2017-07-09 ENCOUNTER — Encounter (HOSPITAL_COMMUNITY): Payer: Self-pay

## 2017-07-09 VITALS — BP 90/0 | HR 80 | Ht 72.0 in | Wt 201.1 lb

## 2017-07-09 DIAGNOSIS — Z7901 Long term (current) use of anticoagulants: Secondary | ICD-10-CM | POA: Insufficient documentation

## 2017-07-09 DIAGNOSIS — Z87891 Personal history of nicotine dependence: Secondary | ICD-10-CM | POA: Insufficient documentation

## 2017-07-09 DIAGNOSIS — Z7982 Long term (current) use of aspirin: Secondary | ICD-10-CM | POA: Diagnosis not present

## 2017-07-09 DIAGNOSIS — E1122 Type 2 diabetes mellitus with diabetic chronic kidney disease: Secondary | ICD-10-CM | POA: Diagnosis not present

## 2017-07-09 DIAGNOSIS — I5043 Acute on chronic combined systolic (congestive) and diastolic (congestive) heart failure: Secondary | ICD-10-CM

## 2017-07-09 DIAGNOSIS — Z8249 Family history of ischemic heart disease and other diseases of the circulatory system: Secondary | ICD-10-CM | POA: Diagnosis not present

## 2017-07-09 DIAGNOSIS — Z79899 Other long term (current) drug therapy: Secondary | ICD-10-CM | POA: Diagnosis not present

## 2017-07-09 DIAGNOSIS — Z95811 Presence of heart assist device: Secondary | ICD-10-CM | POA: Insufficient documentation

## 2017-07-09 DIAGNOSIS — I10 Essential (primary) hypertension: Secondary | ICD-10-CM | POA: Diagnosis not present

## 2017-07-09 DIAGNOSIS — I4892 Unspecified atrial flutter: Secondary | ICD-10-CM | POA: Diagnosis not present

## 2017-07-09 DIAGNOSIS — E039 Hypothyroidism, unspecified: Secondary | ICD-10-CM | POA: Insufficient documentation

## 2017-07-09 DIAGNOSIS — N183 Chronic kidney disease, stage 3 (moderate): Secondary | ICD-10-CM | POA: Diagnosis not present

## 2017-07-09 DIAGNOSIS — I428 Other cardiomyopathies: Secondary | ICD-10-CM | POA: Insufficient documentation

## 2017-07-09 DIAGNOSIS — I5042 Chronic combined systolic (congestive) and diastolic (congestive) heart failure: Secondary | ICD-10-CM | POA: Insufficient documentation

## 2017-07-09 DIAGNOSIS — I130 Hypertensive heart and chronic kidney disease with heart failure and stage 1 through stage 4 chronic kidney disease, or unspecified chronic kidney disease: Secondary | ICD-10-CM | POA: Insufficient documentation

## 2017-07-09 LAB — CBC
HCT: 40.1 % (ref 39.0–52.0)
Hemoglobin: 12.2 g/dL — ABNORMAL LOW (ref 13.0–17.0)
MCH: 26.8 pg (ref 26.0–34.0)
MCHC: 30.4 g/dL (ref 30.0–36.0)
MCV: 88.1 fL (ref 78.0–100.0)
Platelets: 269 10*3/uL (ref 150–400)
RBC: 4.55 MIL/uL (ref 4.22–5.81)
RDW: 19.4 % — ABNORMAL HIGH (ref 11.5–15.5)
WBC: 5.3 10*3/uL (ref 4.0–10.5)

## 2017-07-09 LAB — BASIC METABOLIC PANEL
Anion gap: 8 (ref 5–15)
BUN: 14 mg/dL (ref 6–20)
CO2: 25 mmol/L (ref 22–32)
Calcium: 9.2 mg/dL (ref 8.9–10.3)
Chloride: 106 mmol/L (ref 101–111)
Creatinine, Ser: 1.15 mg/dL (ref 0.61–1.24)
GFR calc Af Amer: >60 mL/min (ref >60)
GFR calc non Af Amer: >60 mL/min (ref >60)
Glucose, Bld: 175 mg/dL — ABNORMAL HIGH (ref 65–99)
Potassium: 4.7 mmol/L (ref 3.5–5.1)
Sodium: 139 mmol/L (ref 135–145)

## 2017-07-09 LAB — PROTIME-INR
INR: 2.73
Prothrombin Time: 28.7 seconds — ABNORMAL HIGH (ref 11.4–15.2)

## 2017-07-09 LAB — LACTATE DEHYDROGENASE: LDH: 285 U/L — ABNORMAL HIGH (ref 98–192)

## 2017-07-09 NOTE — Progress Notes (Signed)
LVAD Clinic Note   Primary Cardiologist: Dr. Gala Romney   HPI: Benjamin Sherman is a 47 y.o. 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 ECHO showed severely reduced EF. 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 Placed on steroids without benefit.  Admitted 11/18 after fall/syncopal episode resulting in bifrontal SAH. Neurosurgery consulted.   Admitted 4/19 with cardiogenic shock in setting of AFL. Supported with inotropes and underwent DC-CV but had persistent shock. After numerous discussions about concern for adequate social support underwent placement of HM-3 LVAD on 05/15/17. D/c home 06/04/17  He presents today for routine VAD f/u. At last visit MAPs were up. We had him stop his lasix and losartan and switched to Entresto 24/26 bid. He had a lot of confusion over his meds but HF pharmD and HHRN helped fill his pillbox. Seems to have meds right now. Says he feels great. More active every day. Says he feels much better than he did prior to VAD implant. Denies orthopnea or PND. No fevers, chills or problems with driveline. No bleeding, melena or neuro symptoms. No VAD alarms. blems with driveline. No bleeding, melena or neuro symptoms. No VAD alarms.  Reports taking Coumadin as prescribed and adherence to anticoagulation based dietary restrictions.  Denies bright red blood per rectum or melena, no dark urine or hematuria.      VAD Indication: Destination Therapy - due to limited social support. Pt discussed with Dr. Edwena Blow at [facility] prior to LVAD implant.  LVAD assessment: Speed: 5300 RPM Flow: 3.8 Power: 3.9 w PI: 5.0 Alarms: none Events: 40 - 50 PI events over last two days  Fixed speed: 5300 Low speed limit: 5000 Primary Controller: Replace back up battery in51months. Back up controller: Replace back up  battery in 85months.    Review of systems complete and found to be negative unless listed in HPI.    Past Medical History:  Diagnosis Date  . CHF (congestive heart failure) (HCC)   . Diabetes mellitus without complication (HCC)     Current Outpatient Medications  Medication Sig Dispense Refill  . amiodarone (PACERONE) 200 MG tablet Take 1 tablet (200 mg total) by mouth daily. 30 tablet 6  . aspirin 81 MG EC tablet Take 1 tablet (81 mg total) by mouth daily. 30 tablet 6  . ferrous fumarate-b12-vitamic C-folic acid (TRINSICON / FOLTRIN) capsule Take 1 capsule by mouth 2 (two) times daily. 60 capsule 6  . gabapentin (NEURONTIN) 300 MG capsule Take 1 capsule (300 mg total) by mouth daily. 30 capsule 6  . omeprazole (PRILOSEC) 40 MG capsule Take 40 mg by mouth daily.    . sacubitril-valsartan (ENTRESTO) 24-26 MG Take 1 tablet by mouth 2 (two) times daily. 60 tablet 6  . warfarin (COUMADIN) 6 MG tablet Take one tablet on Sun, Tues, Thurs, and Sat. Take 1/2 tab on Mon/Wed/Fri (Patient taking differently: 6 mg. Take one half tablet daily) 30 tablet 6  . allopurinol (ZYLOPRIM) 100 MG tablet Take 1 tablet (100 mg total) by mouth daily. (Patient not taking: Reported on 07/09/2017) 30 tablet 0  . enoxaparin (LOVENOX) 40 MG/0.4ML injection Inject 0.4 mLs (40 mg total) into the skin every 12 (twelve) hours for 5 days. 10 Syringe 1  . potassium chloride SA (K-DUR,KLOR-CON) 20 MEQ tablet Take 2 tablets (40 mEq total) by mouth daily. (Patient not taking: Reported on 07/04/2017)  60 tablet 6   No current facility-administered medications for this encounter.    Allergies  Allergen Reactions  . Bee Venom     UNSPECIFIED REACTION       Social History   Socioeconomic History  . Marital status: Single    Spouse name: Not on file  . Number of children: Not on file  . Years of education: Not on file  . Highest education level: Not on file  Occupational History  . Occupation: Diplomatic Services operational officer Needs    . Financial resource strain: Not on file  . Food insecurity:    Worry: Not on file    Inability: Not on file  . Transportation needs:    Medical: Not on file    Non-medical: Not on file  Tobacco Use  . Smoking status: Former Smoker    Types: Cigars    Last attempt to quit: 11/16/2016    Years since quitting: 0.6  . Smokeless tobacco: Never Used  . Tobacco comment: 6 cigars per wk  Substance and Sexual Activity  . Alcohol use: Yes    Alcohol/week: 8.4 oz    Types: 12 Cans of beer, 2 Shots of liquor per week    Comment: occ  . Drug use: No  . Sexual activity: Not on file  Lifestyle  . Physical activity:    Days per week: Not on file    Minutes per session: Not on file  . Stress: Not on file  Relationships  . Social connections:    Talks on phone: Not on file    Gets together: Not on file    Attends religious service: Not on file    Active member of club or organization: Not on file    Attends meetings of clubs or organizations: Not on file    Relationship status: Not on file  . Intimate partner violence:    Fear of current or ex partner: Not on file    Emotionally abused: Not on file    Physically abused: Not on file    Forced sexual activity: Not on file  Other Topics Concern  . Not on file  Social History Narrative  . Not on file      Family History  Problem Relation Age of Onset  . CAD Father     Vitals:   07/09/17 1030 07/09/17 1031  BP: 96/67 (!) 90/0  Pulse: 80   SpO2: 100%   Weight: 201 lb 1.6 oz (91.2 kg)   Height: 6' (1.829 m)     Wt Readings from Last 3 Encounters:  07/09/17 201 lb 1.6 oz (91.2 kg)  07/04/17 200 lb 12.8 oz (91.1 kg)  06/28/17 205 lb 3.2 oz (93.1 kg)     Vital Signs:  Doppler Pressure:90 Automatc BP: 96/67 (80) HR: 80 SPO2: 100% on RA  Weight: 201.1 lb w/o eqt Last weight: 200.8 lb  PHYSICAL EXAM: General:  NAD.  HEENT: normal except for edentulous Neck: supple. JVP not elevated.  Carotids 2+ bilat; no bruits.  No lymphadenopathy or thryomegaly appreciated. Cor: LVAD hum.  Lungs: Clear. Abdomen: soft, nontender, non-distended. No hepatosplenomegaly. No bruits or masses. Good bowel sounds. Driveline site clean. Anchor in place.  Extremities: no cyanosis, clubbing, rash. Warm no edema  Neuro: alert & oriented x 3. No focal deficits. Moves all 4 without problem   ASSESSMENT & PLAN:  1. Chronic Combined Systolic/Diastolic Heart Failure. NICM  - ECHO 4/19 EF 20% CMRI EF 22%  - s/p HM-3  LVAD on 05/15/17 - Doing very well. Now NYHA I with VAD support. Volume status looks good. - Multiple PI events on VAD today but says he didn't drink much over the weekend. No dizziness or presyncope.  - He was instructed to call immediately if developing dizziness or orthostasis.  2. VAD management - VAD interrogated personally. Parameters stable. See discussion above - MAPs much improved. Continue Entesto - LDH 285.  - INR elevated at 2.73. Discussed dosing with PharmD personally at bedside.  3. AFL - Maintaining NSR - Continue amio 200 mg daily for now. Likely stop soon.  4. HTN - MAPs much improved. Continue Entesto  5. Hypothyroidism - TFTs ok  6. DMII - Needs to establish with community health and wellness as planned.   Total time spent 35 minutes. Over half that time spent discussing above.     [name], MD 07/09/17

## 2017-07-09 NOTE — Progress Notes (Signed)
Patient presents for 1 week follow up in VAD Clinic today. He drove himself today. Reports no problems with VAD equipment or concerns with drive line.   Pt reports he is feeling "better every day". Is increasing activity level daily, less walking over weekend, but he was "running around doing errands".  Number of PI events increased this visit. Pt denies any dizzy, lightheaded episodes. He estimates he normally drinks @ 2 liters fluid daily, but "did not drink enough over the weekend".  Pt brought pill box, says his [facility] nurse filled last week. All meds present and accounted for except allopurinol.   Pt reports he has two more visits with [facility] agency, hoping it will be once weekly to assist with pillbox. Leota Sauers, PharmD reviewed INR today and decreased his daily dose of coumadin to 3 mg daily. Pillbox filled for week with correct dose of coumadin.    Vital Signs:  Doppler Pressure: 90 Automatc BP: 96/67 (80) HR: 80 SPO2: 100% on RA  Weight: 201.1 lb w/o eqt Last weight: 200.8 lb  VAD Indication: Destination Therapy - due to limited social support. Pt discussed with Dr. Edwena Blow at [facility] prior to LVAD implant.  LVAD assessment: Speed: 5300 RPM Flow: 3.8 Power: 3.9 w    PI: 5.0 Alarms: none Events: 40 - 50 PI events over last two days  Fixed speed: 5300 Low speed limit: 5000 Primary Controller: Replace back up battery in 32 months. Back up controller: Replace back up battery in 7months.  I reviewed the LVAD parameters from today and compared the results to the patient's prior recorded data. LVAD interrogation was NEGATIVE for significant power changes, NEGATIVE for clinical alarms and STABLE for PI events/speed drops. No programming changes were made and pump is functioning within specified parameters. Pt is performing daily controller and system monitor self tests along with completing weekly and monthly maintenance for LVAD equipment.  LVAD equipment check completed and is  in good working order. Back-up equipment present.   Exit Site Care:  VAD dressing and anchor removed and site care performed using sterile technique. Drive line exit site cleaned with Chlora prep applicators x 2, allowed to dry, skin protectant applied and allowed to dry before gauze dressing and Aquacel silver strip re-applied. Exit site with partial tissue ingrowth; The velour is exposed @ 1/2 inch at exit site. Small amount clear drainage with no redness, tenderness, or foul odor noted. Drive line anchor re-applied. Pt denies fever or chills. Driveline dressing is being changed every other day per sterile technique by gene. Will continue every other day dressing change for now. Supplied pt with 5 daily kits and 5 anchors for home use.   Significant Events on VAD Support:  Device:  N/A  BP & Labs:  Doppler 90 - is reflecting modified systolic BP  Hgb 24.5 - No S/S of bleeding. Specifically denies melena/BRBPR or nosebleeds.  LDH 285 - with established baseline of 200 - 290. Denies tea-colored urine. No power elevations noted on interrogation.   Patient Instructions:  1. Pt needs allopurinol 100 mg daily - does not have bottle in his medications 2. Reduce coumadin to 3 mg daily. 3. Return next Monday for INR and dressing change. 4. Return in 2 weeks for VAD clinic appt.   [name] RN VAD Coordinator   Office: [phone] 24/7 Emergency VAD Pager: [phone]

## 2017-07-09 NOTE — Patient Instructions (Addendum)
1. Pt needs allopurinol 100 mg daily - does not have bottle in his medications 2. Reduce coumadin to 3 mg daily. 3. Return next Monday for INR and dressing change. 4. Return in 2 weeks for VAD clinic appt.

## 2017-07-10 ENCOUNTER — Other Ambulatory Visit (HOSPITAL_COMMUNITY): Payer: Self-pay

## 2017-07-12 ENCOUNTER — Encounter (HOSPITAL_COMMUNITY): Payer: Self-pay

## 2017-07-13 ENCOUNTER — Other Ambulatory Visit (HOSPITAL_COMMUNITY): Payer: Self-pay | Admitting: Unknown Physician Specialty

## 2017-07-13 DIAGNOSIS — Z7901 Long term (current) use of anticoagulants: Secondary | ICD-10-CM

## 2017-07-13 DIAGNOSIS — Z95811 Presence of heart assist device: Secondary | ICD-10-CM

## 2017-07-16 ENCOUNTER — Encounter (HOSPITAL_COMMUNITY): Payer: Self-pay | Admitting: *Deleted

## 2017-07-16 ENCOUNTER — Ambulatory Visit (HOSPITAL_COMMUNITY)
Admission: RE | Admit: 2017-07-16 | Discharge: 2017-07-16 | Disposition: A | Discharge status: Home / Self Care | Payer: Medicaid Other | Source: Ambulatory Visit | Attending: Internal Medicine | Admitting: Internal Medicine

## 2017-07-16 ENCOUNTER — Ambulatory Visit (HOSPITAL_COMMUNITY): Payer: Self-pay | Admitting: Pharmacist

## 2017-07-16 DIAGNOSIS — Z7901 Long term (current) use of anticoagulants: Secondary | ICD-10-CM

## 2017-07-16 DIAGNOSIS — Z95811 Presence of heart assist device: Secondary | ICD-10-CM | POA: Insufficient documentation

## 2017-07-16 LAB — PROTIME-INR
INR: 1.92
Prothrombin Time: 21.8 seconds — ABNORMAL HIGH (ref 11.4–15.2)

## 2017-07-16 NOTE — Telephone Encounter (Signed)
PA denied by [facility] due to patient not currently taking beta-blocker.  Submitted again stating patient has LVAD with moderate RV dysfunction and beta blockers are contraindicated in LVAD patients.  Will wait for approval.

## 2017-07-16 NOTE — Addendum Note (Signed)
Encounter addended by: Lebron Quam, RN on: 07/16/2017 10:58 AM  Actions taken: Sign clinical note

## 2017-07-16 NOTE — Addendum Note (Signed)
Encounter addended by: Lebron Quam, RN on: 07/16/2017 11:40 AM  Actions taken: Sign clinical note

## 2017-07-16 NOTE — Progress Notes (Signed)
Patient presents to clinic today for drive line exit wound care. Existing VAD dressing removed and site care performed using sterile technique. Drive line exit site cleaned with Chlora prep applicators x 2, allowed to dry, and gauze dressing with silver strip re-applied. Exit site healing and partially  incorporated, the velour is more exposed than the last time I changed his dressing, out about 1" see picture below. Moderate amount of clear serous drainage. No redness, tenderness, foul odor or rash noted. Drive line anchor re-applied. Pt denies fever or chills. Pt instructed that he needs to continue every other day dressing changes.        Return to your regularly scheduled appt next week.  [name] RN, BSN VAD Coordinator 24/7 Pager [phone]

## 2017-07-17 NOTE — Telephone Encounter (Signed)
Entresto PA approved through [facility] from 07/16/2017 through 07/11/18.

## 2017-07-23 ENCOUNTER — Ambulatory Visit (HOSPITAL_COMMUNITY): Payer: Self-pay | Admitting: Pharmacist

## 2017-07-23 ENCOUNTER — Other Ambulatory Visit (HOSPITAL_COMMUNITY): Payer: Self-pay | Admitting: *Deleted

## 2017-07-23 ENCOUNTER — Encounter (HOSPITAL_COMMUNITY): Payer: Self-pay

## 2017-07-23 ENCOUNTER — Ambulatory Visit (HOSPITAL_COMMUNITY)
Admission: RE | Admit: 2017-07-23 | Discharge: 2017-07-23 | Disposition: A | Discharge status: Home / Self Care | Payer: Medicaid Other | Source: Ambulatory Visit | Attending: Cardiology | Admitting: Cardiology

## 2017-07-23 VITALS — BP 92/0 | HR 82 | Ht 72.0 in | Wt 203.6 lb

## 2017-07-23 DIAGNOSIS — Z7901 Long term (current) use of anticoagulants: Secondary | ICD-10-CM

## 2017-07-23 DIAGNOSIS — I240 Acute coronary thrombosis not resulting in myocardial infarction: Secondary | ICD-10-CM

## 2017-07-23 DIAGNOSIS — I5020 Unspecified systolic (congestive) heart failure: Secondary | ICD-10-CM

## 2017-07-23 DIAGNOSIS — Z95811 Presence of heart assist device: Secondary | ICD-10-CM | POA: Diagnosis present

## 2017-07-23 DIAGNOSIS — I5043 Acute on chronic combined systolic (congestive) and diastolic (congestive) heart failure: Secondary | ICD-10-CM

## 2017-07-23 DIAGNOSIS — N182 Chronic kidney disease, stage 2 (mild): Secondary | ICD-10-CM

## 2017-07-23 DIAGNOSIS — I513 Intracardiac thrombosis, not elsewhere classified: Secondary | ICD-10-CM | POA: Diagnosis not present

## 2017-07-23 DIAGNOSIS — E118 Type 2 diabetes mellitus with unspecified complications: Secondary | ICD-10-CM

## 2017-07-23 DIAGNOSIS — E039 Hypothyroidism, unspecified: Secondary | ICD-10-CM | POA: Diagnosis not present

## 2017-07-23 DIAGNOSIS — I5022 Chronic systolic (congestive) heart failure: Secondary | ICD-10-CM | POA: Diagnosis not present

## 2017-07-23 LAB — CBC
HCT: 43.9 % (ref 39.0–52.0)
Hemoglobin: 13.7 g/dL (ref 13.0–17.0)
MCH: 27.8 pg (ref 26.0–34.0)
MCHC: 31.2 g/dL (ref 30.0–36.0)
MCV: 89 fL (ref 78.0–100.0)
Platelets: 263 10*3/uL (ref 150–400)
RBC: 4.93 MIL/uL (ref 4.22–5.81)
RDW: 18.9 % — ABNORMAL HIGH (ref 11.5–15.5)
WBC: 8 10*3/uL (ref 4.0–10.5)

## 2017-07-23 LAB — BASIC METABOLIC PANEL
Anion gap: 6 (ref 5–15)
BUN: 11 mg/dL (ref 6–20)
CO2: 28 mmol/L (ref 22–32)
Calcium: 9.3 mg/dL (ref 8.9–10.3)
Chloride: 107 mmol/L (ref 98–111)
Creatinine, Ser: 1.21 mg/dL (ref 0.61–1.24)
GFR calc Af Amer: >60 mL/min (ref >60)
GFR calc non Af Amer: >60 mL/min (ref >60)
Glucose, Bld: 109 mg/dL — ABNORMAL HIGH (ref 70–99)
Potassium: 4.1 mmol/L (ref 3.5–5.1)
Sodium: 141 mmol/L (ref 135–145)

## 2017-07-23 LAB — PROTIME-INR
INR: 2.07
Prothrombin Time: 23.1 seconds — ABNORMAL HIGH (ref 11.4–15.2)

## 2017-07-23 NOTE — Patient Instructions (Addendum)
1. Increase dressing changes to twice weekly. If drainage picks up, go back to every other day.  2. Return in one week for dressing changes and labs.  3. Return to VAD clinic in 3 weeks.

## 2017-07-23 NOTE — Addendum Note (Signed)
Encounter addended by: Levonne Spiller, RN on: 07/23/2017 2:08 PM  Actions taken: Sign clinical note

## 2017-07-23 NOTE — Progress Notes (Signed)
LVAD Clinic Note   Primary Cardiologist: Dr. Gala Romney   HPI: Benjamin Sherman is a 47 y.o. 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 ECHO showed severely reduced EF. 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 Placed on steroids without benefit.  Admitted 11/18 after fall/syncopal episode resulting in bifrontal SAH. Neurosurgery consulted.   Admitted 4/19 with cardiogenic shock in setting of AFL. Supported with inotropes and underwent DC-CV but had persistent shock. After numerous discussions about concern for adequate social support underwent placement of HM-3 LVAD on 05/15/17. D/c home 06/04/17  He presents today for regular follow up. Stable at last visit. He is doing well overall.  Stable off off lasix. He has not needed any. Doing better with his medications with having his pill box filled here. He is becoming more and more active. Playing pool, and going on dates. He denies orthopnea or PND. No fevers, chills, or problems with driveline.  Denies bleeding, melena, or neuro symptoms. No VAD alarms.   Reports taking Coumadin as prescribed and adherence to anticoagulation based dietary restrictions.  Denies bright red blood per rectum or melena, no dark urine or hematuria.     VAD Indication: Destination Therapy - due to limited social support. Pt discussed with Dr. Edwena Blow at [facility] prior to LVAD implant.  LVAD assessment: Speed: 5300  Flow: 4.0 Power: 3.5 w PI: 5.3  Alarms: 0 alarms Events: 20-30 PI events (improved from 40-50)  Fixed speed: 5300 Low speed limit: 5000  Primary Controller: Replace back up battery in76months. Back up controller: Replace back up battery in 17months.  Review of systems complete and found to be negative unless listed in HPI.    Past Medical History:  Diagnosis Date  . CHF  (congestive heart failure) (HCC)   . Diabetes mellitus without complication (HCC)     Current Outpatient Medications  Medication Sig Dispense Refill  . amiodarone (PACERONE) 200 MG tablet Take 1 tablet (200 mg total) by mouth daily. 30 tablet 6  . aspirin 81 MG EC tablet Take 1 tablet (81 mg total) by mouth daily. 30 tablet 6  . ferrous sulfate 325 (65 FE) MG EC tablet Take 325 mg by mouth 2 (two) times daily.    Marland Kitchen gabapentin (NEURONTIN) 300 MG capsule Take 1 capsule (300 mg total) by mouth daily. 30 capsule 6  . omeprazole (PRILOSEC) 40 MG capsule Take 40 mg by mouth daily.    . sacubitril-valsartan (ENTRESTO) 24-26 MG Take 1 tablet by mouth 2 (two) times daily. 60 tablet 6  . warfarin (COUMADIN) 6 MG tablet Take one tablet on Sun, Tues, Thurs, and Sat. Take 1/2 tab on Mon/Wed/Fri (Patient taking differently: 6 mg. Take whole tab on Mondays and  one half tablet daily) 30 tablet 6  . allopurinol (ZYLOPRIM) 100 MG tablet Take 100 mg by mouth daily.     No current facility-administered medications for this encounter.    Allergies  Allergen Reactions  . Bee Venom     UNSPECIFIED REACTION    Social History   Socioeconomic History  . Marital status: Single    Spouse name: Not on file  . Number of children: Not on file  . Years of education: Not on file  . Highest education level: Not on file  Occupational History  . Occupation: Diplomatic Services operational officer Needs  . Financial resource strain:  Not on file  . Food insecurity:    Worry: Not on file    Inability: Not on file  . Transportation needs:    Medical: Not on file    Non-medical: Not on file  Tobacco Use  . Smoking status: Former Smoker    Types: Cigars    Last attempt to quit: 11/16/2016    Years since quitting: 0.6  . Smokeless tobacco: Never Used  . Tobacco comment: 6 cigars per wk  Substance and Sexual Activity  . Alcohol use: Yes    Alcohol/week: 8.4 oz    Types: 12 Cans of beer, 2 Shots of liquor per week    Comment: occ    . Drug use: No  . Sexual activity: Not on file  Lifestyle  . Physical activity:    Days per week: Not on file    Minutes per session: Not on file  . Stress: Not on file  Relationships  . Social connections:    Talks on phone: Not on file    Gets together: Not on file    Attends religious service: Not on file    Active member of club or organization: Not on file    Attends meetings of clubs or organizations: Not on file    Relationship status: Not on file  . Intimate partner violence:    Fear of current or ex partner: Not on file    Emotionally abused: Not on file    Physically abused: Not on file    Forced sexual activity: Not on file  Other Topics Concern  . Not on file  Social History Narrative  . Not on file    Family History  Problem Relation Age of Onset  . CAD Father    Vitals:   07/23/17 0947 07/23/17 0948  BP: 115/90 (!) 92/0  Pulse: 82   SpO2: 100%   Weight: 203 lb 9.6 oz (92.4 kg)   Height: 6' (1.829 m)     Wt Readings from Last 3 Encounters:  07/23/17 203 lb 9.6 oz (92.4 kg)  07/09/17 201 lb 1.6 oz (91.2 kg)  07/04/17 200 lb 12.8 oz (91.1 kg)    Vital Signs:  Doppler Pressure:92 Automatc BP: 115/90 (97) HR: 82 SPO2: 100%  Weight: 203.6 lbs w/o eqt Last weight: 201.1 lbs  PHYSICAL EXAM: General: Well appearing this am. NAD.  HEENT: Normal. Edentulous. Neck: Supple, JVP 7-8 cm. Carotids OK.  Cardiac:  Mechanical heart sounds with LVAD hum present.  Lungs:  CTAB, normal effort.  Abdomen:  NT, ND, no HSM. No bruits or masses. +BS  LVAD exit site: Well-healed and incorporated. Dressing dry and intact. No erythema or drainage. Stabilization device present and accurately applied. Driveline dressing changed daily per sterile technique. Extremities:  Warm and dry. No cyanosis, clubbing, rash, or edema.  Neuro:  Alert & oriented x 3. Cranial nerves grossly intact. Moves all 4 extremities w/o difficulty. Affect pleasant     ASSESSMENT & PLAN:  1.  Chronic Combined Systolic/Diastolic Heart Failure. NICM  - ECHO 4/19 EF 20% CMRI EF 22%  - s/p HM-3 LVAD on 05/15/17 - NYHA II on VAD support. - Volume status stable on exam.  - Continue Entresto 24/26 mg BID - Knows to call immediately if developing dizziness or orthostasis.  2. VAD management - VAD interrogated personally. Parameters stable.   - MAPs low 90s. Will continue current meds for now occasional lightheadedness after taking meds. Will recheck next week   - LDH  at last check. Not drawn today. Check next week with INR - INR 2.07. Discussed dosing with PharmD personally.   3. AFL - Maintaining NSR.  - Continue amio 200 mg daily for now.   4. HTN - MAPs as above.  - Continue Entesto  5. Hypothyroidism - TFTs ok.   6. DMII - Needs to establish with community health and wellness as planned.   Labs, vitals, and VAD parameters stable. Recheck BP at dressing change next week. RTC 3 weeks.  I reviewed the LVAD parameters from today, and compared the results to the patient's prior recorded data.  No programming changes were made.  The LVAD is functioning within specified parameters.  The patient performs LVAD self-test daily.  LVAD interrogation was negative for any significant power changes, alarms or PI events/speed drops.  LVAD equipment check completed and is in good working order.  Back-up equipment present.   LVAD education done on emergency procedures and precautions and reviewed exit site care.   Graciella Freer, PA-C 07/23/17   Greater than 50% of the 30 minute visit was spent in counseling/coordination of care regarding disease state education, salt/fluid restriction, sliding scale diuretics, and medication compliance.

## 2017-07-23 NOTE — Progress Notes (Signed)
Patient presents for 2 week follow up in VAD Clinic today. Reports no problems with VAD equipment or concerns with drive line.   Pt continues to improve, is now walking at [facility] and local mall and continues to increase daily activity.   Number of PI events decreased this visit; he is not taking any diuretics.   VAD coordinator filled his pillbox today. Instructed him to get refills on Prilosec, amiodarone, ferrous sulfate, and attempt to fill Entresto. Pt verbalized agreement to do so. All meds present and accounted for except allopurinol.   Vital Signs:  Doppler Pressure: 92 Automatc BP: 115/90 (97) HR: 82 SPO2: 100% on RA  Weight: 203.6 lb w/o eqt Last weight: 201.1 lb  VAD Indication: Destination Therapy - due to limited social support. Pt discussed with Dr. Edwena Blow at [facility] prior to LVAD implant.  LVAD assessment: Speed: 5300 RPM Flow: 4.0 Power: 3.9 w    PI: 5.3  Alarms: none Events: 20 - 30 PI events over last two days  Fixed speed: 5300 Low speed limit: 5000 Primary Controller: Replace back up battery in 31 months. Back up controller: Replace back up battery in 2months.  I reviewed the LVAD parameters from today and compared the results to the patient's prior recorded data. LVAD interrogation was NEGATIVE for significant power changes, NEGATIVE for clinical alarms and STABLE for PI events/speed drops. No programming changes were made and pump is functioning within specified parameters. Pt is performing daily controller and system monitor self tests along with completing weekly and monthly maintenance for LVAD equipment.  LVAD equipment check completed and is in good working order. Back-up equipment present.   Exit Site Care:  VAD dressing and anchor removed and site care performed using sterile technique. Drive line exit site cleaned with Chlora prep applicators x 2, allowed to dry, skin protectant applied and allowed to dry before gauze dressing and Aquacel  silver strip re-applied. Exit site with partial tissue ingrowth; The velour is exposed @ 1 inch at exit site. Small amount tan drainage with no redness, tenderness, or foul odor noted. Drive line anchor re-applied. Pt denies fever or chills. Driveline dressing is being changed every other day per sterile technique by gene. Will advance to twice weekly with instructions to go back to every other day if drainage increases. Spoke with Gene via telephone with these instructions; he verbalized understanding of same. Supplied pt with 4 daily kits and 4 anchors for home use.     Significant Events on VAD Support:  Device:  N/A  BP & Labs:  Doppler 92 - is reflecting modified systolic BP  Hgb 45.3 - No S/S of bleeding. Specifically denies melena/BRBPR or nosebleeds.  LDH no done - established baseline of 200 - 290. Denies tea-colored urine. No power elevations noted on interrogation.   Patient Instructions:  1. Increase dressing changes to twice weekly. If drainage picks up, go back to every other day.  2. Return in one week for dressing changes and labs.  3. Return to VAD clinic in 3 weeks.   [name] RN VAD Coordinator   Office: [phone] 24/7 Emergency VAD Pager: [phone]

## 2017-07-23 NOTE — Addendum Note (Signed)
Encounter addended by: Marcy Siren, LCSW on: 07/23/2017 12:00 PM  Actions taken: Pend clinical note

## 2017-07-23 NOTE — Progress Notes (Signed)
CSW met with patient in the clinic to discuss further a referral to [facility]. CSW discussed the program and assistance with weekly pill box management. Patient agreeable to referral and signed consent form and order form for services. CSW faxed to [facility]. CSW continues to follow for support and any other needs as they arise. Raquel Sarna, [place], [place]

## 2017-07-27 NOTE — Addendum Note (Signed)
Encounter addended by: Graciella Freer, PA-C on: 07/27/2017 11:09 AM  Actions taken: Charge Capture section accepted

## 2017-07-30 ENCOUNTER — Other Ambulatory Visit (HOSPITAL_COMMUNITY): Payer: Self-pay | Admitting: *Deleted

## 2017-07-30 ENCOUNTER — Other Ambulatory Visit (HOSPITAL_COMMUNITY): Payer: Self-pay | Admitting: Unknown Physician Specialty

## 2017-07-30 ENCOUNTER — Encounter (HOSPITAL_COMMUNITY): Payer: Self-pay

## 2017-07-30 ENCOUNTER — Ambulatory Visit (HOSPITAL_COMMUNITY): Payer: Self-pay | Admitting: Pharmacist

## 2017-07-30 ENCOUNTER — Ambulatory Visit (HOSPITAL_COMMUNITY)
Admission: RE | Admit: 2017-07-30 | Discharge: 2017-07-30 | Disposition: A | Discharge status: Home / Self Care | Payer: Medicaid Other | Source: Ambulatory Visit | Attending: Cardiology | Admitting: Cardiology

## 2017-07-30 DIAGNOSIS — Z95811 Presence of heart assist device: Secondary | ICD-10-CM

## 2017-07-30 DIAGNOSIS — Z7901 Long term (current) use of anticoagulants: Secondary | ICD-10-CM | POA: Diagnosis present

## 2017-07-30 LAB — LACTATE DEHYDROGENASE: LDH: 345 U/L — ABNORMAL HIGH (ref 98–192)

## 2017-07-30 LAB — PROTIME-INR
INR: 2.15
Prothrombin Time: 23.8 seconds — ABNORMAL HIGH (ref 11.4–15.2)

## 2017-07-30 NOTE — Progress Notes (Signed)
Patient presents for 1 week follow up in [place] [facility] today. He drove himself today. Reorts no problems with VAD equipment or concerns with drive line.   Pt here for labs, BP check, and dressing change.  Pt's Paramedic thru Paramedicine is:  Dorthea Cove  [phone]  Pt did no refill Rxs as instructed last visit. He has not been answering phone with Otis Peak (above) has been trying to reach him. Asked him to please answer phone.   Bonnita Nasuti, PharmD in to fill pillbox.   Vital Signs:  Doppler Pressure: 86 Automatc BP: 90/55 (73) HR: 87 SPO2: UTO  Weight: 208.8 b w/o eqt Last weight: 205.2 lb  Drive line care:  Patient presents to clinic today for drive line exit wound care. Existing VAD dressing removed and site care performed using sterile technique. Drive line exit site cleaned with Chlora prep applicators x 2, allowed to dry, and gauze dressing with silver strip re-applied. Exit site healed and incorporated, the velour is fully implanted at exit site. Small amount clear drainage on silver strip only, no redness, tenderness, foul odor or rash noted. Drive line anchor re-applied. Pt denies fever or chills. Gene has been changing twice weekly. Will advance to weekly dressing using gauze dressing. Will re-check next week and see if we can advance to [facility] dressing.   Patient Instructions: 1. Advance to weekly dressing using daily dressing kit. 2. Return to [place] clinic in one week for labs and dressing change.   Zada Girt RN [place] Coordinator   Office: [phone] 24/7 Emergency VAD Pager: [phone]

## 2017-07-30 NOTE — Addendum Note (Signed)
Encounter addended by: Levonne Spiller, RN on: 07/30/2017 11:16 AM  Actions taken: Sign clinical note

## 2017-08-07 ENCOUNTER — Telehealth: Payer: Self-pay | Admitting: Licensed Clinical Social Worker

## 2017-08-07 ENCOUNTER — Telehealth (HOSPITAL_COMMUNITY): Payer: Self-pay | Admitting: *Deleted

## 2017-08-07 ENCOUNTER — Other Ambulatory Visit (HOSPITAL_COMMUNITY): Payer: Self-pay | Admitting: *Deleted

## 2017-08-07 DIAGNOSIS — Z7901 Long term (current) use of anticoagulants: Secondary | ICD-10-CM

## 2017-08-07 DIAGNOSIS — Z95811 Presence of heart assist device: Secondary | ICD-10-CM

## 2017-08-07 NOTE — Telephone Encounter (Signed)
CSW received call from [facility] Paramedic stating she made a home visit today. Patient had missed 3 evening medications and was out of one med. She called in the prescription and will return tomorrow to fill med box with added med. Paramedic will continue to follow for support and medication box fills. CSW informed VAD Coordinator of call and will continue to be available. Lasandra Beech, LCSW, CCSW-MCS [phone]

## 2017-08-07 NOTE — Telephone Encounter (Signed)
Randa Evens from [employer] called and spoke with Annice Pih regarding Mr. Tjarks. He has missed 3 different evenings of medications because he "fell asleep." She stated that a case of beer was found in the corner of the room. She also stated that his Allpurinol was missing. She called this medication in and will pick it up. Per Bufford Buttner plans to visit Mr. Siqueira tomorrow to fill up his pill box.    Molly spoke with Randa Evens, and confirmed he had missed 3 doses of Coumadin. (Tues, Wed, Thurs) of last week. Homero Fellers PharmD aware. Kirt Boys called Mr. Dagoberto Ligas- scheduled INR check appointment in clinic tomorrow.   Alyce Pagan  VAD Coordinator

## 2017-08-08 ENCOUNTER — Ambulatory Visit (HOSPITAL_COMMUNITY): Payer: Self-pay | Admitting: Pharmacist

## 2017-08-08 ENCOUNTER — Ambulatory Visit (HOSPITAL_COMMUNITY)
Admission: RE | Admit: 2017-08-08 | Discharge: 2017-08-08 | Disposition: A | Discharge status: Home / Self Care | Payer: Medicaid Other | Source: Ambulatory Visit | Attending: Internal Medicine | Admitting: Internal Medicine

## 2017-08-08 DIAGNOSIS — Z95811 Presence of heart assist device: Secondary | ICD-10-CM | POA: Insufficient documentation

## 2017-08-08 DIAGNOSIS — Z7901 Long term (current) use of anticoagulants: Secondary | ICD-10-CM | POA: Diagnosis present

## 2017-08-08 LAB — PROTIME-INR
INR: 1.16
Prothrombin Time: 14.7 seconds (ref 11.4–15.2)

## 2017-08-08 NOTE — Progress Notes (Signed)
CSW received a call from Ms. Ferguson at [employer] [phone] stating that they are continuing to work on patient's disability case. She is requesting further information and hopes to have a determination shortly. CSW explained the LVAD and frequency of follow up care needed for life on the LVAD. CSW will follow up with the VAD Coordinators for assistance with needed information. Lasandra Beech, LCSW, CCSW-MCS [phone]

## 2017-08-08 NOTE — Addendum Note (Signed)
Encounter addended by: Marcy Siren, LCSW on: 08/08/2017 12:31 PM  Actions taken: Sign clinical note

## 2017-08-09 ENCOUNTER — Telehealth: Payer: Self-pay | Admitting: Licensed Clinical Social Worker

## 2017-08-09 NOTE — Telephone Encounter (Signed)
CSW received a call from Ms Benjamin Sherman at [employer] requesting more information from patient regarding pending disability application. CSW contacted patient and encouraged him to call ASAP to [employer] @ [phone]. Lasandra Beech, LCSW, CCSW-MCS [phone]

## 2017-08-13 ENCOUNTER — Ambulatory Visit (HOSPITAL_COMMUNITY)
Admission: RE | Admit: 2017-08-13 | Discharge: 2017-08-13 | Disposition: A | Discharge status: Home / Self Care | Payer: Medicaid Other | Source: Ambulatory Visit | Attending: Internal Medicine | Admitting: Internal Medicine

## 2017-08-13 ENCOUNTER — Ambulatory Visit (HOSPITAL_COMMUNITY): Payer: Self-pay | Admitting: Pharmacist

## 2017-08-13 ENCOUNTER — Other Ambulatory Visit (HOSPITAL_COMMUNITY): Payer: Self-pay | Admitting: *Deleted

## 2017-08-13 ENCOUNTER — Encounter (HOSPITAL_COMMUNITY): Payer: Self-pay

## 2017-08-13 VITALS — BP 96/70 | HR 86 | Ht 72.0 in | Wt 211.2 lb

## 2017-08-13 DIAGNOSIS — I5043 Acute on chronic combined systolic (congestive) and diastolic (congestive) heart failure: Secondary | ICD-10-CM

## 2017-08-13 DIAGNOSIS — I5042 Chronic combined systolic (congestive) and diastolic (congestive) heart failure: Secondary | ICD-10-CM | POA: Insufficient documentation

## 2017-08-13 DIAGNOSIS — I10 Essential (primary) hypertension: Secondary | ICD-10-CM

## 2017-08-13 DIAGNOSIS — R7402 Elevation of levels of lactic acid dehydrogenase (LDH): Secondary | ICD-10-CM

## 2017-08-13 DIAGNOSIS — R740 Nonspecific elevation of levels of transaminase and lactic acid dehydrogenase [LDH]: Principal | ICD-10-CM

## 2017-08-13 DIAGNOSIS — Z7901 Long term (current) use of anticoagulants: Secondary | ICD-10-CM | POA: Diagnosis not present

## 2017-08-13 DIAGNOSIS — E118 Type 2 diabetes mellitus with unspecified complications: Secondary | ICD-10-CM | POA: Insufficient documentation

## 2017-08-13 DIAGNOSIS — I240 Acute coronary thrombosis not resulting in myocardial infarction: Secondary | ICD-10-CM

## 2017-08-13 DIAGNOSIS — E039 Hypothyroidism, unspecified: Secondary | ICD-10-CM | POA: Insufficient documentation

## 2017-08-13 DIAGNOSIS — Z8249 Family history of ischemic heart disease and other diseases of the circulatory system: Secondary | ICD-10-CM | POA: Diagnosis not present

## 2017-08-13 DIAGNOSIS — I513 Intracardiac thrombosis, not elsewhere classified: Secondary | ICD-10-CM | POA: Diagnosis not present

## 2017-08-13 DIAGNOSIS — N183 Chronic kidney disease, stage 3 (moderate): Secondary | ICD-10-CM | POA: Insufficient documentation

## 2017-08-13 DIAGNOSIS — I130 Hypertensive heart and chronic kidney disease with heart failure and stage 1 through stage 4 chronic kidney disease, or unspecified chronic kidney disease: Secondary | ICD-10-CM | POA: Insufficient documentation

## 2017-08-13 DIAGNOSIS — I429 Cardiomyopathy, unspecified: Secondary | ICD-10-CM | POA: Insufficient documentation

## 2017-08-13 DIAGNOSIS — Z79899 Other long term (current) drug therapy: Secondary | ICD-10-CM | POA: Insufficient documentation

## 2017-08-13 DIAGNOSIS — Z09 Encounter for follow-up examination after completed treatment for conditions other than malignant neoplasm: Secondary | ICD-10-CM | POA: Insufficient documentation

## 2017-08-13 DIAGNOSIS — I4892 Unspecified atrial flutter: Secondary | ICD-10-CM | POA: Insufficient documentation

## 2017-08-13 DIAGNOSIS — Z7982 Long term (current) use of aspirin: Secondary | ICD-10-CM | POA: Insufficient documentation

## 2017-08-13 DIAGNOSIS — Z95811 Presence of heart assist device: Secondary | ICD-10-CM

## 2017-08-13 LAB — COMPREHENSIVE METABOLIC PANEL
ALT: 13 U/L (ref 0–44)
AST: 44 U/L — ABNORMAL HIGH (ref 15–41)
Albumin: 3.3 g/dL — ABNORMAL LOW (ref 3.5–5.0)
Alkaline Phosphatase: 50 U/L (ref 38–126)
Anion gap: 7 (ref 5–15)
BUN: 26 mg/dL — ABNORMAL HIGH (ref 6–20)
CO2: 25 mmol/L (ref 22–32)
Calcium: 8.8 mg/dL — ABNORMAL LOW (ref 8.9–10.3)
Chloride: 105 mmol/L (ref 98–111)
Creatinine, Ser: 1.25 mg/dL — ABNORMAL HIGH (ref 0.61–1.24)
GFR calc Af Amer: >60 mL/min (ref >60)
GFR calc non Af Amer: >60 mL/min (ref >60)
Glucose, Bld: 188 mg/dL — ABNORMAL HIGH (ref 70–99)
Potassium: 4.7 mmol/L (ref 3.5–5.1)
Sodium: 137 mmol/L (ref 135–145)
Total Bilirubin: 1.2 mg/dL (ref 0.3–1.2)
Total Protein: 6.9 g/dL (ref 6.5–8.1)

## 2017-08-13 LAB — CBC
HCT: 40.4 % (ref 39.0–52.0)
Hemoglobin: 12.5 g/dL — ABNORMAL LOW (ref 13.0–17.0)
MCH: 28.1 pg (ref 26.0–34.0)
MCHC: 30.9 g/dL (ref 30.0–36.0)
MCV: 90.8 fL (ref 78.0–100.0)
Platelets: 236 10*3/uL (ref 150–400)
RBC: 4.45 MIL/uL (ref 4.22–5.81)
RDW: 17.4 % — ABNORMAL HIGH (ref 11.5–15.5)
WBC: 8.1 10*3/uL (ref 4.0–10.5)

## 2017-08-13 LAB — LACTATE DEHYDROGENASE: LDH: 434 U/L — ABNORMAL HIGH (ref 98–192)

## 2017-08-13 LAB — PROTIME-INR
INR: 4.89
Prothrombin Time: 45.3 seconds — ABNORMAL HIGH (ref 11.4–15.2)

## 2017-08-13 NOTE — Progress Notes (Signed)
Patient presents for 2 week follow up in VAD Clinic today. Reports no problems with VAD equipment or concerns with drive line.   Caregiver has continued twice weekly dressing changes using gauze dressing due to continued "small amount of drainage".   Pt has been set up with [facility]. She reported pt missed 3 doses of coumadin because he "fell asleep". Subtherapeutic INR on 08/08/17 of 1.16 required Lovenox coverage. Pt confirms that Gene (his caregiver) has been coming to his house and administering Lovenox. Pt's INR today is 4.89. Leota Sauers, PharmD in to speak with pt.   Vital Signs:  Doppler Pressure: 80 Automatc BP: 96/70 (81) HR: 86 SPO2: 100% on RA  Weight: 203.6 lb w/o eqt Last weight: 203.6 lb  VAD Indication: Destination Therapy - due to limited social support. Pt discussed with Dr. Edwena Blow at [facility] prior to LVAD implant.  LVAD assessment: Speed: 5300 RPM Flow: 4.0 Power: 3.9 w    PI: 3.6 Alarms: none Events: 20 - 30 PI events over last two days  Fixed speed: 5300 Low speed limit: 5000 Primary Controller: Replace back up battery in 31 months. Back up controller: Replace back up battery in 35months.  I reviewed the LVAD parameters from today and compared the results to the patient's prior recorded data. LVAD interrogation was NEGATIVE for significant power changes, NEGATIVE for clinical alarms and STABLE for PI events/speed drops. No programming changes were made and pump is functioning within specified parameters. Pt is performing daily controller and system monitor self tests along with completing weekly and monthly maintenance for LVAD equipment.  LVAD equipment check completed and is in good working order. Back-up equipment present.   Exit Site Care: VAD dressing and anchor removed and site care performed using sterile technique. Drive line exit site cleaned with Chlora prep applicators x 2, allowed to dry, skin protectant applied and  allowed to dry before gauze dressing and Aquacel silver strip re-applied. Exit site with partial tissue ingrowth; The velour is exposed @ 1 inch at exit site. Small amount tan drainage with no redness, tenderness, or foul odor noted. Drive line anchor re-applied. Pt denies fever or chills. Driveline dressing is being changed twice weekly by gene; last changed Thursday.  Will continue twice weekly with gauze dressing and silver strips.  Supplied pt with 6 daily kits and 5 anchors for home use.     Significant Events on VAD Support:  Device:  N/A  BP & Labs:  Doppler 80 - is reflecting MAP  Hgb 12.5 - No S/S of bleeding. Specifically denies melena/BRBPR or nosebleeds.  LDH 434 - above established baseline of 200 - 340. Denies tea-colored urine. No power elevations noted on interrogation.   Patient Instructions:  1. Continue twice weekly dressing changes. 2. Return Friday for INR and repeat LDH. 3. Return next Thursday for INR and dressing change. 4. Return in 1 month for full VAD clinic visit.    [name] RN VAD Coordinator   Office: [phone] 24/7 Emergency VAD Pager: [phone]

## 2017-08-13 NOTE — Progress Notes (Signed)
LVAD Clinic Note   Primary Cardiologist: Dr. Gala Romney   HPI: Benjamin Sherman is a 47 y.o. 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 ECHO showed severely reduced EF. 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 Placed on steroids without benefit.  Admitted 11/18 after fall/syncopal episode resulting in bifrontal SAH. Neurosurgery consulted.   Admitted 4/19 with cardiogenic shock in setting of AFL. Supported with inotropes and underwent DC-CV but had persistent shock. After numerous discussions about concern for adequate social support underwent placement of HM-3 LVAD on 05/15/17. D/c home 06/04/17  He presents today for regular follow up. Feels very good. No CP or SOB. Going to the mall and walking regularly without problem. Missed several doses of coumadin and now on lovenox. Denies orthopnea or PND. No fevers, chills or problems with driveline. No bleeding, melena or neuro symptoms. No VAD alarms.      VAD Indication: Destination Therapy - due to limited social support. Pt discussed with Dr. Edwena Blow at [facility] prior to LVAD implant.  LVAD assessment: Speed: 5300 RPM Flow: 4.0 Power: 3.9 w PI: 3.6 Alarms: none Events: 20 - 30 PI events over last two days  Fixed speed: 5300 Low speed limit: 5000 Primary Controller: Replace back up battery in16months. Back up controller: Replace back up battery in 31months.    Review of systems complete and found to be negative unless listed in HPI.    Past Medical History:  Diagnosis Date  . CHF (congestive heart failure) (HCC)   . Diabetes mellitus without complication (HCC)     Current Outpatient Medications  Medication Sig Dispense Refill  . allopurinol (ZYLOPRIM) 100 MG tablet Take 100 mg by mouth daily.    Marland Kitchen amiodarone (PACERONE) 200 MG tablet Take 1 tablet  (200 mg total) by mouth daily. 30 tablet 6  . aspirin 81 MG EC tablet Take 1 tablet (81 mg total) by mouth daily. 30 tablet 6  . ferrous sulfate 325 (65 FE) MG EC tablet Take 325 mg by mouth 2 (two) times daily.    Marland Kitchen gabapentin (NEURONTIN) 300 MG capsule Take 1 capsule (300 mg total) by mouth daily. 30 capsule 6  . omeprazole (PRILOSEC) 40 MG capsule Take 40 mg by mouth daily.    . sacubitril-valsartan (ENTRESTO) 24-26 MG Take 1 tablet by mouth 2 (two) times daily. 60 tablet 6  . warfarin (COUMADIN) 6 MG tablet Take one tablet on Sun, Tues, Thurs, and Sat. Take 1/2 tab on Mon/Wed/Fri (Patient taking differently: 6 mg. Take whole tab on Mondays and  one half tablet daily) 30 tablet 6   No current facility-administered medications for this encounter.    Allergies  Allergen Reactions  . Bee Venom     UNSPECIFIED REACTION    Social History   Socioeconomic History  . Marital status: Single    Spouse name: Not on file  . Number of children: Not on file  . Years of education: Not on file  . Highest education level: Not on file  Occupational History  . Occupation: Diplomatic Services operational officer Needs  . Financial resource strain: Not on file  . Food insecurity:    Worry: Not on file    Inability: Not on file  . Transportation needs:    Medical: Not on file    Non-medical: Not on file  Tobacco Use  . Smoking status:  Former Smoker    Types: Cigars    Last attempt to quit: 11/16/2016    Years since quitting: 0.7  . Smokeless tobacco: Never Used  . Tobacco comment: 6 cigars per wk  Substance and Sexual Activity  . Alcohol use: Yes    Alcohol/week: 8.4 oz    Types: 12 Cans of beer, 2 Shots of liquor per week    Comment: occ  . Drug use: No  . Sexual activity: Not on file  Lifestyle  . Physical activity:    Days per week: Not on file    Minutes per session: Not on file  . Stress: Not on file  Relationships  . Social connections:    Talks on phone: Not on file    Gets together: Not on file      Attends religious service: Not on file    Active member of club or organization: Not on file    Attends meetings of clubs or organizations: Not on file    Relationship status: Not on file  . Intimate partner violence:    Fear of current or ex partner: Not on file    Emotionally abused: Not on file    Physically abused: Not on file    Forced sexual activity: Not on file  Other Topics Concern  . Not on file  Social History Narrative  . Not on file    Family History  Problem Relation Age of Onset  . CAD Father    Vitals:   08/13/17 1038 08/13/17 1039  BP: (!) 86/0 96/70  Pulse:  86  SpO2:  100%  Weight:  211 lb 3.2 oz (95.8 kg)  Height:  6' (1.829 m)    Wt Readings from Last 3 Encounters:  08/13/17 211 lb 3.2 oz (95.8 kg)  07/23/17 203 lb 9.6 oz (92.4 kg)  07/09/17 201 lb 1.6 oz (91.2 kg)    Vital Signs:  Doppler Pressure:80 Automatc BP: 96/70 (81) HR: 86 SPO2: 100% on RA  Weight: 203.6 lb w/o eqt Last weight: 203.6 lb   PHYSICAL EXAM: General:  NAD.  HEENT: normal edentulous Neck: supple. JVP not elevated.  Carotids 2+ bilat; no bruits. No lymphadenopathy or thryomegaly appreciated. Cor: LVAD hum.  Lungs: Clear. Abdomen: soft, nontender, non-distended. No hepatosplenomegaly. No bruits or masses. Good bowel sounds. Driveline site clean. Anchor in place.  Extremities: no cyanosis, clubbing, rash. Warm no edema  Neuro: alert & oriented x 3. No focal deficits. Moves all 4 without problem    ASSESSMENT & PLAN:  1. Chronic Combined Systolic/Diastolic Heart Failure. NICM  - ECHO 4/19 EF 20% CMRI EF 22%  - s/p HM-3 LVAD on 05/15/17 - Doing very well on VAD support. NYHA I. - Volume status ok  - Continue Entresto 24/26 mg BID  2. VAD management - VAD interrogated personally. Parameters stable. - MAPs 80s. Blood pressure well controlled. Continue current regimen. - LDH up slightly in setting of low INR earlier in the week. No power spikes or dark urine. INR  today 4.89. Will need to follow LDH closely. Discussed dosing with PharmD personally. - Stressed need not to missed coumadin dosses   3. AFL - Maintaining NSR.  - Continue amio 200 mg daily for now.   4. HTN - MAPs well controlled  - Continue Entesto  5. Hypothyroidism - TFTs ok.   6. DMII - Needs to establish with community health and wellness as planned.   Total time spent 35 minutes. Over half that  time spent discussing above. Long discussion about need to be more careful with AC dosing.    I reviewed the LVAD parameters from today, and compared the results to the patient's prior recorded data.  No programming changes were made.  The LVAD is functioning within specified parameters.  The patient performs LVAD self-test daily.  LVAD interrogation was negative for any significant power changes, alarms or PI events/speed drops.  LVAD equipment check completed and is in good working order.  Back-up equipment present.   LVAD education done on emergency procedures and precautions and reviewed exit site care.   [name], MD 08/13/17

## 2017-08-14 ENCOUNTER — Telehealth (HOSPITAL_COMMUNITY): Payer: Self-pay | Admitting: *Deleted

## 2017-08-14 NOTE — Telephone Encounter (Signed)
Opened in error

## 2017-08-17 ENCOUNTER — Ambulatory Visit (HOSPITAL_COMMUNITY): Payer: Self-pay | Admitting: Pharmacist

## 2017-08-17 ENCOUNTER — Ambulatory Visit (HOSPITAL_COMMUNITY)
Admission: RE | Admit: 2017-08-17 | Discharge: 2017-08-17 | Disposition: A | Discharge status: Home / Self Care | Payer: Medicaid Other | Source: Ambulatory Visit | Attending: Cardiology | Admitting: Cardiology

## 2017-08-17 DIAGNOSIS — R740 Nonspecific elevation of levels of transaminase and lactic acid dehydrogenase [LDH]: Secondary | ICD-10-CM | POA: Diagnosis not present

## 2017-08-17 DIAGNOSIS — Z95811 Presence of heart assist device: Secondary | ICD-10-CM | POA: Diagnosis not present

## 2017-08-17 DIAGNOSIS — R7402 Elevation of levels of lactic acid dehydrogenase (LDH): Secondary | ICD-10-CM

## 2017-08-17 DIAGNOSIS — Z7901 Long term (current) use of anticoagulants: Secondary | ICD-10-CM | POA: Diagnosis not present

## 2017-08-17 LAB — LACTATE DEHYDROGENASE: LDH: 300 U/L — ABNORMAL HIGH (ref 98–192)

## 2017-08-17 LAB — PROTIME-INR
INR: 2.56
Prothrombin Time: 27.3 seconds — ABNORMAL HIGH (ref 11.4–15.2)

## 2017-08-20 ENCOUNTER — Other Ambulatory Visit (HOSPITAL_COMMUNITY): Payer: Self-pay | Admitting: Unknown Physician Specialty

## 2017-08-20 DIAGNOSIS — Z7901 Long term (current) use of anticoagulants: Secondary | ICD-10-CM

## 2017-08-20 DIAGNOSIS — Z95811 Presence of heart assist device: Secondary | ICD-10-CM

## 2017-08-23 ENCOUNTER — Ambulatory Visit (HOSPITAL_COMMUNITY): Payer: Self-pay | Admitting: Pharmacist

## 2017-08-23 ENCOUNTER — Ambulatory Visit (HOSPITAL_COMMUNITY)
Admission: RE | Admit: 2017-08-23 | Discharge: 2017-08-23 | Disposition: A | Discharge status: Home / Self Care | Payer: Medicaid Other | Source: Ambulatory Visit | Attending: Internal Medicine | Admitting: Internal Medicine

## 2017-08-23 DIAGNOSIS — Z95811 Presence of heart assist device: Secondary | ICD-10-CM | POA: Insufficient documentation

## 2017-08-23 DIAGNOSIS — Z7901 Long term (current) use of anticoagulants: Secondary | ICD-10-CM | POA: Diagnosis present

## 2017-08-23 LAB — PROTIME-INR
INR: 2.89
Prothrombin Time: 30 seconds — ABNORMAL HIGH (ref 11.4–15.2)

## 2017-08-23 NOTE — Addendum Note (Signed)
Encounter addended by: Lebron Quam, RN on: 08/23/2017 10:31 AM  Actions taken: Sign clinical note

## 2017-08-23 NOTE — Progress Notes (Signed)
Exit Site Care: VAD dressing and anchor removed and site care performed using sterile technique. Drive line exit site cleaned with Chlora prep applicators x 2, allowed to dry, skin protectant applied and allowed to dry before sorbaview and bio patch applied. Exit site with partial tissue ingrowth; The velour is exposed @ 1/4" at exit site. Scant amount tan drainage with no redness, tenderness, or foul odor noted. Drive line anchor re-applied. Pt denies fever or chills. Will advance pt to weekly dressing kits. I will call Gene today to inform him of the change and educate him over the phone. Supplied pt with 4 weekly kits for home use.    Return to clinic at regulary scheduled visit. INR results pending.  [name] RN VAD Coordinator   Office: [phone] 24/7 Emergency VAD Pager: [phone]

## 2017-08-29 ENCOUNTER — Other Ambulatory Visit (HOSPITAL_COMMUNITY): Payer: Self-pay | Admitting: *Deleted

## 2017-08-29 DIAGNOSIS — Z95811 Presence of heart assist device: Secondary | ICD-10-CM

## 2017-08-29 DIAGNOSIS — Z7901 Long term (current) use of anticoagulants: Secondary | ICD-10-CM

## 2017-08-30 ENCOUNTER — Ambulatory Visit (HOSPITAL_COMMUNITY)
Admission: RE | Admit: 2017-08-30 | Discharge: 2017-08-30 | Disposition: A | Discharge status: Home / Self Care | Payer: Medicaid Other | Source: Ambulatory Visit | Attending: Internal Medicine | Admitting: Internal Medicine

## 2017-08-30 ENCOUNTER — Ambulatory Visit (HOSPITAL_COMMUNITY): Payer: Self-pay | Admitting: Pharmacist

## 2017-08-30 DIAGNOSIS — Z7901 Long term (current) use of anticoagulants: Secondary | ICD-10-CM | POA: Insufficient documentation

## 2017-08-30 DIAGNOSIS — Z95811 Presence of heart assist device: Secondary | ICD-10-CM | POA: Diagnosis not present

## 2017-08-30 LAB — PROTIME-INR
INR: 2
Prothrombin Time: 22.5 seconds — ABNORMAL HIGH (ref 11.4–15.2)

## 2017-08-30 NOTE — Progress Notes (Signed)
Patient presents to clinic today for drive line exit wound care. Existing VAD dressing removed and site care performed using sterile technique. Drive line exit site cleaned with Chlora prep applicators x 2, allowed to dry, and Sorbaview dressing with bio patch re-applied.Exit site incorporated with @ 1 inch velour exposed. No drainge, tenderness, redness, or foul odor note. Drive line anchor re-applied. Pt denies fever or chills.   Caregiver, Gene, is performing weekly dressing changes. Instructed pt to continue weekly dressing changes. Will re-assess site at next VAD Clinic visit and determine if he is OK to start showering. Pt verbalized understanding of same.    Pt met Erika, PharmD. She will be calling him with INR results and coumadin dosing instructions later today.  Return to your regularly scheduled appt next week.  Molly Reece RN, BSN VAD Coordinator 24/7 Pager [phone]  

## 2017-08-30 NOTE — Addendum Note (Signed)
Encounter addended by: Levonne Spiller, RN on: 08/30/2017 10:03 AM  Actions taken: Sign clinical note

## 2017-09-04 ENCOUNTER — Encounter (HOSPITAL_COMMUNITY): Payer: Self-pay | Admitting: Unknown Physician Specialty

## 2017-09-10 ENCOUNTER — Ambulatory Visit (HOSPITAL_COMMUNITY): Payer: Self-pay | Admitting: Pharmacist

## 2017-09-10 ENCOUNTER — Encounter (HOSPITAL_COMMUNITY): Payer: Self-pay

## 2017-09-10 ENCOUNTER — Other Ambulatory Visit (HOSPITAL_COMMUNITY): Payer: Self-pay | Admitting: *Deleted

## 2017-09-10 ENCOUNTER — Other Ambulatory Visit (HOSPITAL_COMMUNITY): Payer: Self-pay | Admitting: Unknown Physician Specialty

## 2017-09-10 ENCOUNTER — Ambulatory Visit (HOSPITAL_COMMUNITY)
Admission: RE | Admit: 2017-09-10 | Discharge: 2017-09-10 | Disposition: A | Discharge status: Home / Self Care | Payer: Medicaid Other | Source: Ambulatory Visit | Attending: Cardiology | Admitting: Cardiology

## 2017-09-10 VITALS — BP 106/69 | HR 81 | Ht 72.0 in | Wt 215.0 lb

## 2017-09-10 DIAGNOSIS — I5042 Chronic combined systolic (congestive) and diastolic (congestive) heart failure: Secondary | ICD-10-CM | POA: Insufficient documentation

## 2017-09-10 DIAGNOSIS — E039 Hypothyroidism, unspecified: Secondary | ICD-10-CM | POA: Insufficient documentation

## 2017-09-10 DIAGNOSIS — Z7982 Long term (current) use of aspirin: Secondary | ICD-10-CM | POA: Insufficient documentation

## 2017-09-10 DIAGNOSIS — I10 Essential (primary) hypertension: Secondary | ICD-10-CM | POA: Diagnosis not present

## 2017-09-10 DIAGNOSIS — I429 Cardiomyopathy, unspecified: Secondary | ICD-10-CM | POA: Insufficient documentation

## 2017-09-10 DIAGNOSIS — Z95811 Presence of heart assist device: Secondary | ICD-10-CM | POA: Insufficient documentation

## 2017-09-10 DIAGNOSIS — N183 Chronic kidney disease, stage 3 (moderate): Secondary | ICD-10-CM | POA: Diagnosis not present

## 2017-09-10 DIAGNOSIS — I480 Paroxysmal atrial fibrillation: Secondary | ICD-10-CM

## 2017-09-10 DIAGNOSIS — I130 Hypertensive heart and chronic kidney disease with heart failure and stage 1 through stage 4 chronic kidney disease, or unspecified chronic kidney disease: Secondary | ICD-10-CM | POA: Insufficient documentation

## 2017-09-10 DIAGNOSIS — F17290 Nicotine dependence, other tobacco product, uncomplicated: Secondary | ICD-10-CM | POA: Diagnosis not present

## 2017-09-10 DIAGNOSIS — I4892 Unspecified atrial flutter: Secondary | ICD-10-CM | POA: Insufficient documentation

## 2017-09-10 DIAGNOSIS — Z79899 Other long term (current) drug therapy: Secondary | ICD-10-CM | POA: Insufficient documentation

## 2017-09-10 DIAGNOSIS — Z7901 Long term (current) use of anticoagulants: Secondary | ICD-10-CM

## 2017-09-10 DIAGNOSIS — I5022 Chronic systolic (congestive) heart failure: Secondary | ICD-10-CM

## 2017-09-10 DIAGNOSIS — E1122 Type 2 diabetes mellitus with diabetic chronic kidney disease: Secondary | ICD-10-CM | POA: Insufficient documentation

## 2017-09-10 LAB — CBC
HCT: 43.8 % (ref 39.0–52.0)
Hemoglobin: 13.7 g/dL (ref 13.0–17.0)
MCH: 28.2 pg (ref 26.0–34.0)
MCHC: 31.3 g/dL (ref 30.0–36.0)
MCV: 90.3 fL (ref 78.0–100.0)
Platelets: 231 10*3/uL (ref 150–400)
RBC: 4.85 MIL/uL (ref 4.22–5.81)
RDW: 15.9 % — ABNORMAL HIGH (ref 11.5–15.5)
WBC: 6.3 10*3/uL (ref 4.0–10.5)

## 2017-09-10 LAB — BASIC METABOLIC PANEL
Anion gap: 8 (ref 5–15)
BUN: 23 mg/dL — ABNORMAL HIGH (ref 6–20)
CO2: 26 mmol/L (ref 22–32)
Calcium: 9.8 mg/dL (ref 8.9–10.3)
Chloride: 105 mmol/L (ref 98–111)
Creatinine, Ser: 1.24 mg/dL (ref 0.61–1.24)
GFR calc Af Amer: >60 mL/min (ref >60)
GFR calc non Af Amer: >60 mL/min (ref >60)
Glucose, Bld: 125 mg/dL — ABNORMAL HIGH (ref 70–99)
Potassium: 4.7 mmol/L (ref 3.5–5.1)
Sodium: 139 mmol/L (ref 135–145)

## 2017-09-10 LAB — LACTATE DEHYDROGENASE: LDH: 305 U/L — ABNORMAL HIGH (ref 98–192)

## 2017-09-10 LAB — PROTIME-INR
INR: 2.42
Prothrombin Time: 26.2 seconds — ABNORMAL HIGH (ref 11.4–15.2)

## 2017-09-10 NOTE — Progress Notes (Signed)
LVAD Clinic Note   Primary Cardiologist: Dr. Gala Romney   HPI: Benjamin Sherman is a 47 y.o. 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 ECHO showed severely reduced EF. 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 Placed on steroids without benefit.  Admitted 11/18 after fall/syncopal episode resulting in bifrontal SAH. Neurosurgery consulted.   Admitted 4/19 with cardiogenic shock in setting of AFL. Supported with inotropes and underwent DC-CV but had persistent shock. After numerous discussions about concern for adequate social support underwent placement of HM-3 LVAD on 05/15/17. D/c home 06/04/17  He presents today for regular follow up. Overall continues to do quite well. Remains active. Continues to go to the mall and walking regularly without problem. Denies CP or SOB . No edema. Has had 3 low flow alarms over last few days. Feels his BP is up. No bleeding. No dizziness. No palpitations. Denies orthopnea or PND. No fevers, chills or problems with driveline. No melena or neuro symptoms.Taking all meds as prescribed.   VAD Indication: Destination Therapy - due to limited social support. Pt discussed with Dr. Edwena Blow at [facility] prior to LVAD implant.  LVAD assessment: Speed: 5300 RPM Flow: 3.5 Power: 4.0 w PI: 5.7 Alarms: LF on 8/24, 25, 26 Events: 10-20 pi events daily  Fixed speed: 5300 Low speed limit: 5000 Primary Controller: Replace back up battery in70months. Back up controller: Replace back up battery in 45months.  I reviewed the LVAD parameters from todayand compared the results to the patient's prior recorded data.LVAD interrogation was NEGATIVEfor significant power changes, NEGATIVEfor clinicalalarms and STABLEfor PI events/speed drops. No programming changes were madeand pump is functioning  within specified parameters. Pt is performing daily controller and system monitor self tests along with completing weekly and monthly maintenance for LVAD equipment.  LVAD equipment check completed and is in good working order. Back-up equipment present.    Review of systems complete and found to be negative unless listed in HPI.    Past Medical History:  Diagnosis Date  . CHF (congestive heart failure) (HCC)   . Diabetes mellitus without complication (HCC)     Current Outpatient Medications  Medication Sig Dispense Refill  . amiodarone (PACERONE) 200 MG tablet Take 1 tablet (200 mg total) by mouth daily. 30 tablet 6  . aspirin 81 MG EC tablet Take 1 tablet (81 mg total) by mouth daily. 30 tablet 6  . ferrous sulfate 325 (65 FE) MG EC tablet Take 325 mg by mouth 2 (two) times daily.    Marland Kitchen gabapentin (NEURONTIN) 300 MG capsule Take 1 capsule (300 mg total) by mouth daily. 30 capsule 6  . pantoprazole (PROTONIX) 40 MG tablet Take 40 mg by mouth daily.    . sacubitril-valsartan (ENTRESTO) 24-26 MG Take 1 tablet by mouth 2 (two) times daily. 60 tablet 6  . warfarin (COUMADIN) 6 MG tablet Take 3 mg by mouth daily.    Marland Kitchen allopurinol (ZYLOPRIM) 100 MG tablet Take 100 mg by mouth daily.     No current facility-administered medications for this encounter.    Allergies  Allergen Reactions  . Bee Venom     UNSPECIFIED REACTION    Social History   Socioeconomic History  . Marital status: Single    Spouse name: Not on file  . Number of children: Not on file  . Years of education: Not on file  .  Highest education level: Not on file  Occupational History  . Occupation: Diplomatic Services operational officer Needs  . Financial resource strain: Not on file  . Food insecurity:    Worry: Not on file    Inability: Not on file  . Transportation needs:    Medical: Not on file    Non-medical: Not on file  Tobacco Use  . Smoking status: Former Smoker    Types: Cigars    Last attempt to quit: 11/16/2016     Years since quitting: 0.8  . Smokeless tobacco: Never Used  . Tobacco comment: 6 cigars per wk  Substance and Sexual Activity  . Alcohol use: Yes    Alcohol/week: 14.0 standard drinks    Types: 12 Cans of beer, 2 Shots of liquor per week    Comment: occ  . Drug use: No  . Sexual activity: Not on file  Lifestyle  . Physical activity:    Days per week: Not on file    Minutes per session: Not on file  . Stress: Not on file  Relationships  . Social connections:    Talks on phone: Not on file    Gets together: Not on file    Attends religious service: Not on file    Active member of club or organization: Not on file    Attends meetings of clubs or organizations: Not on file    Relationship status: Not on file  . Intimate partner violence:    Fear of current or ex partner: Not on file    Emotionally abused: Not on file    Physically abused: Not on file    Forced sexual activity: Not on file  Other Topics Concern  . Not on file  Social History Narrative  . Not on file    Family History  Problem Relation Age of Onset  . CAD Father    Vitals:   09/10/17 1010 09/10/17 1011  BP: (!) 98/0 106/69  Pulse:  81  SpO2:  100%  Weight: 97.5 kg (215 lb)   Height: 6' (1.829 m)     Wt Readings from Last 3 Encounters:  09/10/17 97.5 kg (215 lb)  08/13/17 95.8 kg (211 lb 3.2 oz)  07/23/17 92.4 kg (203 lb 9.6 oz)    Vital Signs:  Doppler Pressure:92 Automatc BP: 106/69 (92) HR: 81-NSR SPO2: 100% on RA  Weight: 215 lb w/o eqt Last weight: 211.2 lb  General:  NAD.  HEENT: normal  Neck: supple. JVP flat  Carotids 2+ bilat; no bruits. No lymphadenopathy or thryomegaly appreciated. Cor: LVAD hum.  Lungs: Clear. Abdomen: soft, nontender, non-distended. No hepatosplenomegaly. No bruits or masses. Good bowel sounds. Driveline site clean. Anchor in place.  Extremities: no cyanosis, clubbing, rash. Warm no edema  Neuro: alert & oriented x 3. No focal deficits. Moves all 4 without  problem     ASSESSMENT & PLAN:  1. Chronic Combined Systolic/Diastolic Heart Failure. NICM  - ECHO 4/19 EF 20% CMRI EF 22%  - s/p HM-3 LVAD on 05/15/17 - Doing very well on VAD support. NYHA I.  - Volume status looks a bit down and suspect this is reason for low flows. Hgb ok. Encouraged him to eat more salt in his diet.  - Continue Entresto 24/26 mg BID for now. If low flows persist will need to consider switching to losartan  2. VAD management - VAD interrogated personally. Parameters stable. PI events less frequent. A few low flows as discussed above.  - MAPs  low 90s. Continue current regimen for now  - LDH stable at 305 - INR 2.42 Discussed dosing with PharmD personally.   3. AFL - Checked on tele today. Maintainintg NSR - Continue amio 200 mg daily for now.   4. HTN - MAP slightly elevated. Continue Entresto 24/26. See discussion above  5. Hypothyroidism - TFTs ok.   6. DMII - Needs to establish with community health and wellness as planned.   Total time spent 35 minutes. Over half that time spent discussing above.   I reviewed the LVAD parameters from today, and compared the results to the patient's prior recorded data.  No programming changes were made.  The LVAD is functioning within specified parameters.  The patient performs LVAD self-test daily.  LVAD interrogation was negative for any significant power changes, alarms or PI events/speed drops.  LVAD equipment check completed and is in good working order.  Back-up equipment present.   LVAD education done on emergency procedures and precautions and reviewed exit site care.   [name], MD 09/10/17

## 2017-09-10 NOTE — Progress Notes (Signed)
Patient presents for 1 month follow up in VAD Clinic today. Reports no problems with VAD equipment or concerns with drive line.   Pt states that he has been feeling great and walking around the [employer]. Pt does have a low flow daily for the past 3 days.    Vital Signs:  Doppler Pressure: 92 Automatc BP: 106/69 (92) HR: 81-NSR SPO2: 100% on RA  Weight: 215 lb w/o eqt Last weight: 211.2 lb  VAD Indication: Destination Therapy - due to limited social support. Pt discussed with Dr. Edwena Blow at [facility] prior to LVAD implant.  LVAD assessment: Speed: 5300 RPM Flow: 3.5 Power: 4.0 w    PI: 5.7 Alarms: LF on 8/24, 25, 26 Events: 10-20 pi events daily  Fixed speed: 5300 Low speed limit: 5000 Primary Controller: Replace back up battery in 31 months. Back up controller: Replace back up battery in 51months.  I reviewed the LVAD parameters from today and compared the results to the patient's prior recorded data. LVAD interrogation was NEGATIVE for significant power changes, NEGATIVE for clinical alarms and STABLE for PI events/speed drops. No programming changes were made and pump is functioning within specified parameters. Pt is performing daily controller and system monitor self tests along with completing weekly and monthly maintenance for LVAD equipment.  LVAD equipment check completed and is in good working order. Back-up equipment present.   Exit Site Care: Sorbaview dressing dry and intact. Gene is doing weekly dressing changes. Exit site healed and incorporated, the velour is fully implanted at exit site. No redness, tenderness, drainage, foul odor or rash noted. Drive line anchor secure. Pt denies fever or chills.   Pt instructed on how to shower today and given 2 shower kits for home use.  Here are some tips for washing:  . Avoid getting the driveline exit site dressing wet, and consider planning bathing times around exit site dressing changes. . Use only the shower  bag we gave you to shower with and don't get creative with your equipment to shower. Water and electricity do not mix and will cause your pump to stop.   . Sit on a chair or bathing stool in the bathtub or shower stall, and use a basin of warm water and washcloth or sponge to wash. Or, wash at the sink while standing on a towel, so as not to get the floor or bath rug wet. . To wash your hair, try using a hand-held shower wand or sprayer while standing over the kitchen sink or bathtub. . Be sure that all floor surfaces are dry when walking around after bathing, to avoid slipping. . Do not use powder around the exit site dressing. . Anytime your dressing appears wet CHANGE IT!  Significant Events on VAD Support:  Device:  N/A  BP & Labs:  Doppler 92 - is reflecting MAP  Hgb pending - No S/S of bleeding. Specifically denies melena/BRBPR or nosebleeds.  LDH pending - above established baseline of 200 - 340. Denies tea-colored urine. No power elevations noted on interrogation.   Patient Instructions:  1. Drink more fluid and eat more salt. 2. Call the office if you continue to have low flow alarms. 3. Return to clinic in 1 month.  [name] RN VAD Coordinator   Office: [phone] 24/7 Emergency VAD Pager: [phone]

## 2017-09-10 NOTE — Patient Instructions (Signed)
1. Drink more fluid and eat more salt. 2. Call the office if you continue to have low flow alarms. 3. Return to clinic in 1 month.

## 2017-09-12 ENCOUNTER — Telehealth (HOSPITAL_COMMUNITY): Payer: Self-pay | Admitting: Pharmacist

## 2017-09-12 NOTE — Telephone Encounter (Signed)
Entresto PA approved by [place] Medicaid through 09/05/18.    Tyler Deis. Bonnye Fava, PharmD, BCPS, CPP Clinical Pharmacist Phone: [phone] 09/12/2017 2:44 PM

## 2017-09-13 ENCOUNTER — Emergency Department (HOSPITAL_COMMUNITY)
Admission: EM | Admit: 2017-09-13 | Discharge: 2017-09-13 | Disposition: A | Discharge status: Home / Self Care | Payer: Medicaid Other | Attending: Emergency Medicine | Admitting: Emergency Medicine

## 2017-09-13 ENCOUNTER — Encounter (HOSPITAL_COMMUNITY): Payer: Self-pay | Admitting: Emergency Medicine

## 2017-09-13 ENCOUNTER — Other Ambulatory Visit: Payer: Self-pay

## 2017-09-13 DIAGNOSIS — S20469A Insect bite (nonvenomous) of unspecified back wall of thorax, initial encounter: Secondary | ICD-10-CM | POA: Insufficient documentation

## 2017-09-13 DIAGNOSIS — Z7982 Long term (current) use of aspirin: Secondary | ICD-10-CM | POA: Diagnosis not present

## 2017-09-13 DIAGNOSIS — Z79899 Other long term (current) drug therapy: Secondary | ICD-10-CM | POA: Diagnosis not present

## 2017-09-13 DIAGNOSIS — Z7901 Long term (current) use of anticoagulants: Secondary | ICD-10-CM | POA: Diagnosis not present

## 2017-09-13 DIAGNOSIS — Y999 Unspecified external cause status: Secondary | ICD-10-CM | POA: Diagnosis not present

## 2017-09-13 DIAGNOSIS — Y929 Unspecified place or not applicable: Secondary | ICD-10-CM | POA: Diagnosis not present

## 2017-09-13 DIAGNOSIS — N183 Chronic kidney disease, stage 3 (moderate): Secondary | ICD-10-CM | POA: Insufficient documentation

## 2017-09-13 DIAGNOSIS — I5022 Chronic systolic (congestive) heart failure: Secondary | ICD-10-CM | POA: Diagnosis not present

## 2017-09-13 DIAGNOSIS — Z87891 Personal history of nicotine dependence: Secondary | ICD-10-CM | POA: Insufficient documentation

## 2017-09-13 DIAGNOSIS — E1122 Type 2 diabetes mellitus with diabetic chronic kidney disease: Secondary | ICD-10-CM | POA: Insufficient documentation

## 2017-09-13 DIAGNOSIS — Y939 Activity, unspecified: Secondary | ICD-10-CM | POA: Insufficient documentation

## 2017-09-13 DIAGNOSIS — W57XXXA Bitten or stung by nonvenomous insect and other nonvenomous arthropods, initial encounter: Secondary | ICD-10-CM | POA: Insufficient documentation

## 2017-09-13 DIAGNOSIS — T63461A Toxic effect of venom of wasps, accidental (unintentional), initial encounter: Secondary | ICD-10-CM

## 2017-09-13 MED ORDER — FAMOTIDINE 20 MG PO TABS
20.0000 mg | ORAL_TABLET | Freq: Once | ORAL | Status: AC
  Administered 2017-09-13: 20 mg via ORAL
  Filled 2017-09-13: qty 1

## 2017-09-13 MED ORDER — DIPHENHYDRAMINE HCL 50 MG/ML IJ SOLN
25.0000 mg | Freq: Once | INTRAMUSCULAR | Status: AC
  Administered 2017-09-13: 25 mg via INTRAMUSCULAR
  Filled 2017-09-13: qty 1

## 2017-09-13 MED ORDER — EPINEPHRINE 0.3 MG/0.3ML IJ SOAJ: 0.3000 mg | Freq: Once | INTRAMUSCULAR | 0 refills | Status: AC

## 2017-09-13 MED ORDER — DEXAMETHASONE SODIUM PHOSPHATE 10 MG/ML IJ SOLN
10.0000 mg | Freq: Once | INTRAMUSCULAR | Status: AC
  Administered 2017-09-13: 10 mg via INTRAMUSCULAR
  Filled 2017-09-13: qty 1

## 2017-09-13 NOTE — Discharge Instructions (Addendum)
It does not appear you are having an anaphylactic reaction to this wasp sting and you should not have any further problems from this sting.  However,  you are being prescribed an epi pen so you have one on hand if you are exposed to a bee sting.  I have printed information about the symptoms you should watch for which would require treatment with the epipen (shortness of breath, face, mouth or throat swelling).

## 2017-09-13 NOTE — ED Triage Notes (Addendum)
Pt states stung my wasp to right side mid back 25 min ago and is allergic, states he swells to the site of bite. Pt denies feeling like throat is closing. Pt in nad. Area is swollen and red. LVAD pt. Pt states did not take benadryl and cant find epi pen

## 2017-09-15 NOTE — ED Provider Notes (Signed)
[facility] EMERGENCY DEPARTMENT Provider Note   CSN: [id number] Arrival date & time: 09/13/17  4098     History   Chief Complaint Chief Complaint  Patient presents with  . Insect Bite    HPI Benjamin Sherman is a 47 y.o. 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  He reports localized pain at the site, but denies swelling of his face, neck, mouth or tongue, denies cough, shortness of breath, n/v, abdominal pain or other complaint.  He has had no treatments prior to arrival.  The history is provided by the patient.    Past Medical History:  Diagnosis Date  . CHF (congestive heart failure) (HCC)   . Diabetes mellitus without complication [facility])     Patient Active Problem List   Diagnosis Date Noted  . Fever and chills 05/29/2017  . LVAD (left ventricular assist device) present (HCC) 05/15/2017  . Advance care planning   . Goals of care, counseling/discussion   . Palliative care encounter   . PAF (paroxysmal atrial fibrillation) (HCC)   . Fluid overload 04/21/2017  . Subarachnoid hemorrhage (HCC)   . Pneumothorax on right   . Chronic systolic CHF (congestive heart failure) (HCC)   . CKD (chronic kidney disease), stage III (HCC)   . Diabetes mellitus type 2, uncontrolled, with complications (HCC)   . Traumatic subarachnoid hemorrhage with loss of consciousness of 30 minutes or less (HCC)   . Anticoagulated   . Hyperkalemia   . Traumatic subarachnoid hematoma with loss of consciousness (HCC) 12/06/2016  . Hypothyroid 11/28/2016  . CKD (chronic kidney disease), stage II 11/28/2016  . DM II (diabetes mellitus, type II), controlled (HCC) 11/28/2016  . LV (left ventricular) mural thrombus without MI 11/28/2016  . CHF (congestive heart failure) (HCC) 11/06/2016     Past Surgical History:  Procedure Laterality Date  . CARDIOVERSION N/A 04/27/2017   Procedure: CARDIOVERSION;  Surgeon: Dolores Patty, MD;  Location: [facility] OR;  Service: Cardiovascular;  Laterality: N/A;  . IABP INSERTION N/A 04/25/2017   Procedure: IABP INSERTION;  Surgeon: Dolores Patty, MD;  Location: [facility] INVASIVE CV LAB;  Service: Cardiovascular;  Laterality: N/A;  . IABP INSERTION N/A 05/14/2017   Procedure: IABP INSERTION;  Surgeon: Dolores Patty, MD;  Location: [facility] INVASIVE CV LAB;  Service: Cardiovascular;  Laterality: N/A;  . INSERTION OF IMPLANTABLE LEFT VENTRICULAR ASSIST DEVICE N/A 05/15/2017   Procedure: INSERTION OF IMPLANTABLE LEFT VENTRICULAR ASSIST DEVICE-HM3;  Surgeon: Kerin Perna, MD;  Location: [facility] OR;  Service: Open Heart Surgery;  Laterality: N/A;  HM3  CIRC ARREST  NITRIC OXIDE  . MULTIPLE EXTRACTIONS WITH ALVEOLOPLASTY N/A 05/10/2017   Procedure: Extraction of 2-6, 8-15, 17, 19-24, 27- 30, and 32 with alveoloplasty, and maxillary and mandibular lateral exotoses reductions.;  Surgeon: Charlynne Pander, DDS;  Location: [facility] OR;  Service: Oral Surgery;  Laterality: N/A;  . RIGHT HEART CATH N/A 04/25/2017   Procedure: RIGHT HEART CATH;  Surgeon: Dolores Patty, MD;  Location: [facility] INVASIVE CV LAB;  Service: Cardiovascular;  Laterality: N/A;  . RIGHT HEART CATH N/A 05/14/2017   Procedure: RIGHT HEART CATH;  Surgeon: Dolores Patty, MD;  Location: [facility] INVASIVE CV LAB;  Service: Cardiovascular;  Laterality: N/A;  . RIGHT/LEFT HEART CATH AND CORONARY ANGIOGRAPHY  N/A 11/10/2016   Procedure: RIGHT/LEFT HEART CATH AND CORONARY ANGIOGRAPHY;  Surgeon: Dolores Patty, MD;  Location: [facility] INVASIVE CV LAB;  Service: Cardiovascular;  Laterality: N/A;  . TEE WITHOUT CARDIOVERSION N/A 05/15/2017   Procedure: TRANSESOPHAGEAL ECHOCARDIOGRAM (TEE);  Surgeon: Donata Clay, Theron Arista, MD;  Location: [facility] OR;  Service: Open Heart Surgery;  Laterality: N/A;        Home  Medications    Prior to Admission medications   Medication Sig Start Date End Date Taking? Authorizing Provider  allopurinol (ZYLOPRIM) 100 MG tablet Take 100 mg by mouth daily.   Yes [provider]  amiodarone (PACERONE) 200 MG tablet Take 1 tablet (200 mg total) by mouth daily. 06/29/17  Yes Bensimhon, Bevelyn Buckles, MD  aspirin 81 MG EC tablet Take 1 tablet (81 mg total) by mouth daily. 06/29/17  Yes Bensimhon, Bevelyn Buckles, MD  ferrous sulfate 325 (65 FE) MG EC tablet Take 325 mg by mouth 2 (two) times daily.   Yes [provider]  gabapentin (NEURONTIN) 300 MG capsule Take 1 capsule (300 mg total) by mouth daily. 06/07/17  Yes Bensimhon, Bevelyn Buckles, MD  pantoprazole (PROTONIX) 40 MG tablet Take 40 mg by mouth daily.   Yes [provider]  sacubitril-valsartan (ENTRESTO) 24-26 MG Take 1 tablet by mouth 2 (two) times daily. 06/28/17  Yes Laurey Morale, MD  warfarin (COUMADIN) 6 MG tablet Take 3 mg by mouth daily.   Yes [provider]    Family History Family History  Problem Relation Age of Onset  . CAD Father     Social History Social History   Tobacco Use  . Smoking status: Former Smoker    Types: Cigars    Last attempt to quit: 11/16/2016    Years since quitting: 0.8  . Smokeless tobacco: Never Used  . Tobacco comment: 6 cigars per wk  Substance Use Topics  . Alcohol use: Yes    Alcohol/week: 14.0 standard drinks    Types: 12 Cans of beer, 2 Shots of liquor per week    Comment: occ  . Drug use: No     Allergies   Bee venom   Review of Systems Review of Systems  Constitutional: Negative for fever.  HENT: Negative for congestion, facial swelling, sore throat, trouble swallowing and voice change.   Eyes: Negative.   Respiratory: Negative for cough, chest tightness, shortness of breath, wheezing and stridor.   Cardiovascular: Negative for chest pain.  Gastrointestinal: Negative for abdominal pain, nausea and vomiting.  Genitourinary:  Negative.   Musculoskeletal: Negative for arthralgias, joint swelling and neck pain.  Skin: Positive for wound. Negative for color change and rash.  Neurological: Negative for dizziness, weakness, light-headedness, numbness and headaches.  Psychiatric/Behavioral: Negative.      Physical Exam Updated Vital Signs BP 99/76   Pulse 89   Temp 98.1 F (36.7 C) (Oral)   Resp 18   SpO2 100%   Physical Exam  Constitutional: He appears well-developed and well-nourished.  HENT:  Head: Normocephalic and atraumatic.  Eyes: Conjunctivae are normal.  Neck: Normal range of motion.  Cardiovascular: Normal rate, regular rhythm, normal heart sounds and intact distal pulses.  Pulmonary/Chest: Effort normal and breath sounds normal. He has no wheezes.  Abdominal: Soft. Bowel sounds are normal. There is no tenderness.  Musculoskeletal: Normal range of motion.  Neurological: He is alert.  Skin: Skin is warm and dry.  Small puncture wound mid thoracic back with 2 cm area of surrounding erythema,  no induration or swelling noted.  Psychiatric: He has a normal mood and affect.  Nursing note and vitals reviewed.    ED Treatments / Results  Labs (all labs ordered are listed, but only abnormal results are displayed) Labs Reviewed - No data to display  EKG None  Radiology No results found.  Procedures Procedures (including critical care time)  Medications Ordered in ED Medications  diphenhydrAMINE (BENADRYL) injection 25 mg (25 mg Intramuscular Given 09/13/17 1052)  famotidine (PEPCID) tablet 20 mg (20 mg Oral Given 09/13/17 1052)  dexamethasone (DECADRON) injection 10 mg (10 mg Intramuscular Given 09/13/17 1052)     Initial Impression / Assessment and Plan / ED Course  I have reviewed the triage vital signs and the nursing notes.  Pertinent labs & imaging results that were available during my care of the patient were reviewed by me and considered in my medical decision making (see chart  for details).     Pt was given benadryl, pepcid and decadron while here.  Observed for nearly 3 hours from the time of the sting.  The erythema surrounding the sting site resolved.  He had no sob, wheezing, swelling or other sx suggesting systemic reaction.  He was prescribed an epi pen since he stated he did not have one, instructions given for indication for use.  Prn f/u anticipated, strict return precautions discussed.    Final Clinical Impressions(s) / ED Diagnoses   Final diagnoses:  Wasp sting, accidental or unintentional, initial encounter    ED Discharge Orders         Ordered    EPINEPHrine 0.3 mg/0.3 mL IJ SOAJ injection   Once     09/13/17 1209           Burgess Amor, Cordelia Poche 09/15/17 1729    Bethann Berkshire, MD 09/16/17 [phone]

## 2017-09-21 ENCOUNTER — Other Ambulatory Visit (HOSPITAL_COMMUNITY): Payer: Self-pay | Admitting: Unknown Physician Specialty

## 2017-09-21 DIAGNOSIS — Z95811 Presence of heart assist device: Secondary | ICD-10-CM

## 2017-09-21 DIAGNOSIS — Z7901 Long term (current) use of anticoagulants: Secondary | ICD-10-CM

## 2017-09-24 ENCOUNTER — Ambulatory Visit (HOSPITAL_COMMUNITY)
Admission: RE | Admit: 2017-09-24 | Discharge: 2017-09-24 | Disposition: A | Discharge status: Home / Self Care | Payer: Medicaid Other | Source: Ambulatory Visit | Attending: Cardiology | Admitting: Cardiology

## 2017-09-24 ENCOUNTER — Ambulatory Visit (HOSPITAL_COMMUNITY): Payer: Self-pay | Admitting: Pharmacist

## 2017-09-24 DIAGNOSIS — Z7901 Long term (current) use of anticoagulants: Secondary | ICD-10-CM | POA: Diagnosis present

## 2017-09-24 DIAGNOSIS — Z95811 Presence of heart assist device: Secondary | ICD-10-CM | POA: Diagnosis present

## 2017-09-24 LAB — PROTIME-INR
INR: 1.61
Prothrombin Time: 19 seconds — ABNORMAL HIGH (ref 11.4–15.2)

## 2017-09-24 LAB — LACTATE DEHYDROGENASE: LDH: 235 U/L — ABNORMAL HIGH (ref 98–192)

## 2017-09-28 ENCOUNTER — Other Ambulatory Visit (HOSPITAL_COMMUNITY): Payer: Self-pay | Admitting: Unknown Physician Specialty

## 2017-09-28 DIAGNOSIS — Z7901 Long term (current) use of anticoagulants: Secondary | ICD-10-CM

## 2017-09-28 DIAGNOSIS — Z95811 Presence of heart assist device: Secondary | ICD-10-CM

## 2017-10-02 ENCOUNTER — Ambulatory Visit (HOSPITAL_COMMUNITY): Payer: Self-pay | Admitting: Pharmacist

## 2017-10-02 ENCOUNTER — Ambulatory Visit (HOSPITAL_COMMUNITY)
Admission: RE | Admit: 2017-10-02 | Discharge: 2017-10-02 | Disposition: A | Discharge status: Home / Self Care | Payer: Medicaid Other | Source: Ambulatory Visit | Attending: Internal Medicine | Admitting: Internal Medicine

## 2017-10-02 DIAGNOSIS — Z7901 Long term (current) use of anticoagulants: Secondary | ICD-10-CM | POA: Diagnosis not present

## 2017-10-02 DIAGNOSIS — Z95811 Presence of heart assist device: Secondary | ICD-10-CM | POA: Diagnosis present

## 2017-10-02 LAB — PROTIME-INR
INR: 2.1
Prothrombin Time: 23.3 seconds — ABNORMAL HIGH (ref 11.4–15.2)

## 2017-10-02 LAB — LACTATE DEHYDROGENASE: LDH: 224 U/L — ABNORMAL HIGH (ref 98–192)

## 2017-10-05 ENCOUNTER — Other Ambulatory Visit (HOSPITAL_COMMUNITY): Payer: Self-pay | Admitting: *Deleted

## 2017-10-05 DIAGNOSIS — Z7901 Long term (current) use of anticoagulants: Secondary | ICD-10-CM

## 2017-10-05 DIAGNOSIS — Z95811 Presence of heart assist device: Secondary | ICD-10-CM

## 2017-10-11 ENCOUNTER — Ambulatory Visit (HOSPITAL_COMMUNITY)
Admission: RE | Admit: 2017-10-11 | Discharge: 2017-10-11 | Disposition: A | Discharge status: Home / Self Care | Payer: Medicaid Other | Source: Ambulatory Visit | Attending: Cardiology | Admitting: Cardiology

## 2017-10-11 ENCOUNTER — Ambulatory Visit (HOSPITAL_COMMUNITY): Payer: Self-pay | Admitting: Pharmacist

## 2017-10-11 ENCOUNTER — Encounter (HOSPITAL_COMMUNITY): Payer: Self-pay

## 2017-10-11 VITALS — BP 111/89 | HR 93 | Ht 72.0 in | Wt 222.8 lb

## 2017-10-11 DIAGNOSIS — Z4502 Encounter for adjustment and management of automatic implantable cardiac defibrillator: Secondary | ICD-10-CM | POA: Diagnosis present

## 2017-10-11 DIAGNOSIS — Z7901 Long term (current) use of anticoagulants: Secondary | ICD-10-CM | POA: Insufficient documentation

## 2017-10-11 DIAGNOSIS — R1084 Generalized abdominal pain: Secondary | ICD-10-CM

## 2017-10-11 DIAGNOSIS — Z95811 Presence of heart assist device: Secondary | ICD-10-CM | POA: Insufficient documentation

## 2017-10-11 LAB — BASIC METABOLIC PANEL
Anion gap: 9 (ref 5–15)
BUN: 23 mg/dL — ABNORMAL HIGH (ref 6–20)
CO2: 25 mmol/L (ref 22–32)
Calcium: 9.2 mg/dL (ref 8.9–10.3)
Chloride: 106 mmol/L (ref 98–111)
Creatinine, Ser: 1.48 mg/dL — ABNORMAL HIGH (ref 0.61–1.24)
GFR calc Af Amer: >60 mL/min (ref >60)
GFR calc non Af Amer: 54 mL/min — ABNORMAL LOW (ref >60)
Glucose, Bld: 121 mg/dL — ABNORMAL HIGH (ref 70–99)
Potassium: 4.5 mmol/L (ref 3.5–5.1)
Sodium: 140 mmol/L (ref 135–145)

## 2017-10-11 LAB — CBC
HCT: 46.7 % (ref 39.0–52.0)
Hemoglobin: 14.8 g/dL (ref 13.0–17.0)
MCH: 29.1 pg (ref 26.0–34.0)
MCHC: 31.7 g/dL (ref 30.0–36.0)
MCV: 91.9 fL (ref 78.0–100.0)
Platelets: 240 10*3/uL (ref 150–400)
RBC: 5.08 MIL/uL (ref 4.22–5.81)
RDW: 16.5 % — ABNORMAL HIGH (ref 11.5–15.5)
WBC: 6.4 10*3/uL (ref 4.0–10.5)

## 2017-10-11 LAB — PROTIME-INR
INR: 1.61
Prothrombin Time: 19 seconds — ABNORMAL HIGH (ref 11.4–15.2)

## 2017-10-11 LAB — LACTATE DEHYDROGENASE: LDH: 231 U/L — ABNORMAL HIGH (ref 98–192)

## 2017-10-11 MED ORDER — CEPHALEXIN 500 MG PO CAPS: 500.0000 mg | ORAL_CAPSULE | Freq: Three times a day (TID) | ORAL | 0 refills | Status: AC

## 2017-10-11 NOTE — Progress Notes (Signed)
Patient presents for 1 month follow up in VAD Clinic today. Reports no problems with VAD equipment or concerns with drive line.   Pt states that he has been feeling great and walking around the [employer].   Vital Signs:  Doppler Pressure: 86 Automatc BP: 111/89 (97) HR: 93-NSR SPO2: 99% on RA  Weight: 222.8 lb w/o eqt Last weight: 215 lb  VAD Indication: Destination Therapy - due to limited social support. Pt discussed with Dr. Edwena Blow at [facility] prior to LVAD implant.  LVAD assessment: Speed: 5300 RPM Flow: 3.7 Power: 3.9 w    PI: 5.2 Alarms: none Events: 20-30 pi events daily  Fixed speed: 5300 Low speed limit: 5000 Primary Controller: Replace back up battery in 29 months. Back up controller: Replace back up battery in 32months.  I reviewed the LVAD parameters from today and compared the results to the patient's prior recorded data. LVAD interrogation was NEGATIVE for significant power changes, NEGATIVE for clinical alarms and STABLE for PI events/speed drops. No programming changes were made and pump is functioning within specified parameters. Pt is performing daily controller and system monitor self tests along with completing weekly and monthly maintenance for LVAD equipment.  LVAD equipment check completed and is in good working order. Back-up equipment present.   Exit Site Care: Sorbaview dressing w/yellowish drainage visible. Gene is doing weekly dressing changes. Existing VAD dressing removed and site care performed using sterile technique. Drive line exit site cleaned with Chlora prep applicators x 2, allowed to dry, and gauze dressing with silver strip re-applied. Exit site healed and partially incorporated, the velour is fully implanted at exit site. Moderate amount of yellow drainage with tenderness in and around drive line. Tonye Becket, NP in to assess drive line. Culture sent. No redness, foul odor or rash noted. Drive line anchor re-applied. Pt denies fever  or chills. Pt instructed to go back to daily dressing changes with silver strip.   Significant Events on VAD Support:  Device:  N/A  BP & Labs:  Doppler 92 - is reflecting MAP  Hgb 14.8 - No S/S of bleeding. Specifically denies melena/BRBPR or nosebleeds.  LDH 231 - above established baseline of 200 - 340. Denies tea-colored urine. No power elevations noted on interrogation.   Patient Instructions:  1. Resume daily dressing changes with silver strip. 2. NO Showering, sink bath only. 3. Coumadin increased to 6 mg on Thursday and 3 mg all other days. 4. Return to clinic for nurse visit and labs in 1 week.   [name] RN VAD Coordinator   Office: [phone] 24/7 Emergency VAD Pager: [phone]

## 2017-10-11 NOTE — Patient Instructions (Signed)
1. Resume daily dressing changes with silver strip. 2. NO Showering, sink bath only. 3. Coumadin increased to 6 mg on Thursday and 3 mg all other days. 4. Return to clinic for nurse visit and labs in 1 week.

## 2017-10-12 ENCOUNTER — Other Ambulatory Visit (HOSPITAL_COMMUNITY): Payer: Self-pay | Admitting: Unknown Physician Specialty

## 2017-10-12 DIAGNOSIS — Z95811 Presence of heart assist device: Secondary | ICD-10-CM

## 2017-10-12 DIAGNOSIS — Z7901 Long term (current) use of anticoagulants: Secondary | ICD-10-CM

## 2017-10-13 LAB — AEROBIC CULTURE W GRAM STAIN (SUPERFICIAL SPECIMEN)

## 2017-10-13 LAB — AEROBIC CULTURE  (SUPERFICIAL SPECIMEN)

## 2017-10-15 ENCOUNTER — Telehealth (HOSPITAL_COMMUNITY): Payer: Self-pay | Admitting: Unknown Physician Specialty

## 2017-10-15 NOTE — Telephone Encounter (Signed)
Pt had + drive line culture, called pt to ensure he started the Keflex. Pt states he has been taking three times a day as instructed. Pt states that the drainage is a lot less and the Gene is doing daily dressing changes. Pt instructed to continue daily dressing changes until we see him on Friday. We will f/u with the pt on Friday.   [name] RN, BSN VAD Coordinator 24/7 Pager [phone]

## 2017-10-19 ENCOUNTER — Ambulatory Visit (HOSPITAL_COMMUNITY): Payer: Self-pay | Admitting: Pharmacist

## 2017-10-19 ENCOUNTER — Ambulatory Visit (HOSPITAL_COMMUNITY)
Admission: RE | Admit: 2017-10-19 | Discharge: 2017-10-19 | Disposition: A | Discharge status: Home / Self Care | Payer: Medicaid Other | Source: Ambulatory Visit | Attending: Cardiology | Admitting: Cardiology

## 2017-10-19 ENCOUNTER — Other Ambulatory Visit (HOSPITAL_COMMUNITY): Payer: Self-pay | Admitting: *Deleted

## 2017-10-19 DIAGNOSIS — Z7901 Long term (current) use of anticoagulants: Secondary | ICD-10-CM

## 2017-10-19 DIAGNOSIS — Z95811 Presence of heart assist device: Secondary | ICD-10-CM

## 2017-10-19 DIAGNOSIS — Y838 Other surgical procedures as the cause of abnormal reaction of the patient, or of later complication, without mention of misadventure at the time of the procedure: Secondary | ICD-10-CM | POA: Insufficient documentation

## 2017-10-19 DIAGNOSIS — T827XXA Infection and inflammatory reaction due to other cardiac and vascular devices, implants and grafts, initial encounter: Secondary | ICD-10-CM

## 2017-10-19 LAB — PROTIME-INR
INR: 1.25
Prothrombin Time: 15.6 seconds — ABNORMAL HIGH (ref 11.4–15.2)

## 2017-10-19 LAB — LACTATE DEHYDROGENASE: LDH: 263 U/L — ABNORMAL HIGH (ref 98–192)

## 2017-10-19 NOTE — Addendum Note (Signed)
Encounter addended by: Bernita Raisin, RN on: 10/19/2017 11:42 AM  Actions taken: Sign clinical note

## 2017-10-19 NOTE — Progress Notes (Signed)
Patient presents to clinic today for drive line exit wound care. Existing VAD dressing removed and site care performed using sterile technique. Drive line exit site cleaned with Chlora prep applicators x 2, allowed to dry, and guaze dressing with silver strip re-applied. Exit site healed and incorporated, the velour is fully implanted at exit site. No redness, tenderness, drainage, foul odor or rash noted. Drive line anchor re-applied. Pt denies fever or chills.   Will advance to every other day dressing changes. Will see in clinic in 1 week for wound check.   Alyce Pagan RN VAD Coordinator  Office: [phone]  24/7 Pager: [phone]

## 2017-10-22 ENCOUNTER — Other Ambulatory Visit (HOSPITAL_COMMUNITY): Payer: Self-pay | Admitting: *Deleted

## 2017-10-22 ENCOUNTER — Inpatient Hospital Stay (HOSPITAL_COMMUNITY): Admission: RE | Admit: 2017-10-22 | Payer: Self-pay | Source: Ambulatory Visit

## 2017-10-22 DIAGNOSIS — Z7901 Long term (current) use of anticoagulants: Secondary | ICD-10-CM

## 2017-10-22 DIAGNOSIS — Z95811 Presence of heart assist device: Secondary | ICD-10-CM

## 2017-10-25 ENCOUNTER — Ambulatory Visit (HOSPITAL_COMMUNITY)
Admission: RE | Admit: 2017-10-25 | Discharge: 2017-10-25 | Disposition: A | Discharge status: Home / Self Care | Payer: Medicaid Other | Source: Ambulatory Visit | Attending: Internal Medicine | Admitting: Internal Medicine

## 2017-10-25 ENCOUNTER — Ambulatory Visit (HOSPITAL_COMMUNITY): Payer: Self-pay | Admitting: Pharmacist

## 2017-10-25 DIAGNOSIS — Z95811 Presence of heart assist device: Secondary | ICD-10-CM | POA: Diagnosis present

## 2017-10-25 DIAGNOSIS — Z7901 Long term (current) use of anticoagulants: Secondary | ICD-10-CM

## 2017-10-25 LAB — PROTIME-INR
INR: 2.28
Prothrombin Time: 24.9 seconds — ABNORMAL HIGH (ref 11.4–15.2)

## 2017-10-25 LAB — LACTATE DEHYDROGENASE: LDH: 276 U/L — ABNORMAL HIGH (ref 98–192)

## 2017-10-25 NOTE — Progress Notes (Signed)
Patient presents to clinic today for drive line exit wound care. Existing VAD dressing removed and site care performed using sterile technique. Drive line exit site cleaned with Chlora prep applicators x 2, allowed to dry, and Sorbaview dressing with bio patch re-applied.Exit site incorporated with @ 1 inch velour exposed. Moderate amount of brown drainage with no tenderness, redness, or foul odor noted. Pt did miss dressing change yesterday, Gene was out of town. He has been having dressing changes every other day. Drive line anchor changed. Pt denies fever or chills. Pt says he has adequate dressing supplies at home.       No showers at this time until exit site has no drainage and we advance to weekly dressing changes with Sorbaview dressing. Pt verbalizes agreement to same.   Pt will return in 1 - 2 weeks as needed for INR testing and we will change dressing and check site that day. Pt understands if drainage increases or he has any symptoms of infection, he is to call VAD pager.  [name] RN, BSN VAD Coordinator 24/7 Pager [phone]

## 2017-10-25 NOTE — Addendum Note (Signed)
Encounter addended by: Levonne Spiller, RN on: 10/25/2017 2:24 PM  Actions taken: Sign clinical note

## 2017-10-26 ENCOUNTER — Other Ambulatory Visit (HOSPITAL_COMMUNITY): Payer: Self-pay | Admitting: *Deleted

## 2017-10-26 DIAGNOSIS — Z7901 Long term (current) use of anticoagulants: Secondary | ICD-10-CM

## 2017-10-26 DIAGNOSIS — Z95811 Presence of heart assist device: Secondary | ICD-10-CM

## 2017-10-30 ENCOUNTER — Other Ambulatory Visit (HOSPITAL_COMMUNITY): Payer: Self-pay

## 2017-11-01 ENCOUNTER — Ambulatory Visit (HOSPITAL_COMMUNITY): Payer: Self-pay | Admitting: Pharmacist

## 2017-11-01 ENCOUNTER — Ambulatory Visit (HOSPITAL_COMMUNITY)
Admission: RE | Admit: 2017-11-01 | Discharge: 2017-11-01 | Disposition: A | Discharge status: Home / Self Care | Payer: Medicaid Other | Source: Ambulatory Visit | Attending: Cardiology | Admitting: Cardiology

## 2017-11-01 DIAGNOSIS — Z95811 Presence of heart assist device: Secondary | ICD-10-CM | POA: Insufficient documentation

## 2017-11-01 DIAGNOSIS — Z7901 Long term (current) use of anticoagulants: Secondary | ICD-10-CM | POA: Diagnosis present

## 2017-11-01 LAB — PROTIME-INR
INR: 2.94
Prothrombin Time: 30.2 seconds — ABNORMAL HIGH (ref 11.4–15.2)

## 2017-11-01 NOTE — Progress Notes (Signed)
Patient presents to clinic today for drive line exit wound care. Existing VAD dressing removed and site care performed using sterile technique. Drive line exit site cleaned with Chlora prep applicators x 2, allowed to dry, and guaze dressing with silver strip re-applied. Exit site healed and incorporated, the velour is fully implanted at exit site. Minimal amount of yellow/brown drainage noted on gauze. No redness, tenderness, foul odor or rash noted. Drive line anchor re-applied. Pt denies fever or chills.    Will advance to twice a week dressing changes. Pt understands if drainage increases or he has any symptoms of infection, he is to call VAD pager. Will check drive line when he comes back for INR check per pharmacy.   Alyce Pagan RN VAD Coordinator  Office: [phone]  24/7 Pager: [phone]

## 2017-11-19 ENCOUNTER — Other Ambulatory Visit (HOSPITAL_COMMUNITY): Payer: Self-pay | Admitting: *Deleted

## 2017-11-19 DIAGNOSIS — Z95811 Presence of heart assist device: Secondary | ICD-10-CM

## 2017-11-19 DIAGNOSIS — Z7901 Long term (current) use of anticoagulants: Secondary | ICD-10-CM

## 2017-11-22 ENCOUNTER — Ambulatory Visit (HOSPITAL_COMMUNITY)
Admission: RE | Admit: 2017-11-22 | Discharge: 2017-11-22 | Disposition: A | Discharge status: Home / Self Care | Payer: Medicaid Other | Source: Ambulatory Visit | Attending: Internal Medicine | Admitting: Internal Medicine

## 2017-11-22 ENCOUNTER — Encounter (HOSPITAL_COMMUNITY): Payer: Self-pay

## 2017-11-22 ENCOUNTER — Ambulatory Visit (HOSPITAL_COMMUNITY): Payer: Self-pay | Admitting: Pharmacist

## 2017-11-22 VITALS — BP 90/0 | HR 89 | Wt 237.8 lb

## 2017-11-22 DIAGNOSIS — Z79899 Other long term (current) drug therapy: Secondary | ICD-10-CM | POA: Diagnosis not present

## 2017-11-22 DIAGNOSIS — I5022 Chronic systolic (congestive) heart failure: Secondary | ICD-10-CM

## 2017-11-22 DIAGNOSIS — R740 Nonspecific elevation of levels of transaminase and lactic acid dehydrogenase [LDH]: Secondary | ICD-10-CM

## 2017-11-22 DIAGNOSIS — I5042 Chronic combined systolic (congestive) and diastolic (congestive) heart failure: Secondary | ICD-10-CM | POA: Insufficient documentation

## 2017-11-22 DIAGNOSIS — E039 Hypothyroidism, unspecified: Secondary | ICD-10-CM | POA: Diagnosis not present

## 2017-11-22 DIAGNOSIS — Z4509 Encounter for adjustment and management of other cardiac device: Secondary | ICD-10-CM | POA: Diagnosis not present

## 2017-11-22 DIAGNOSIS — I4892 Unspecified atrial flutter: Secondary | ICD-10-CM | POA: Insufficient documentation

## 2017-11-22 DIAGNOSIS — I428 Other cardiomyopathies: Secondary | ICD-10-CM | POA: Diagnosis not present

## 2017-11-22 DIAGNOSIS — Z7982 Long term (current) use of aspirin: Secondary | ICD-10-CM | POA: Diagnosis not present

## 2017-11-22 DIAGNOSIS — Z87891 Personal history of nicotine dependence: Secondary | ICD-10-CM | POA: Insufficient documentation

## 2017-11-22 DIAGNOSIS — E1122 Type 2 diabetes mellitus with diabetic chronic kidney disease: Secondary | ICD-10-CM | POA: Diagnosis not present

## 2017-11-22 DIAGNOSIS — R7402 Elevation of levels of lactic acid dehydrogenase (LDH): Secondary | ICD-10-CM

## 2017-11-22 DIAGNOSIS — Z8249 Family history of ischemic heart disease and other diseases of the circulatory system: Secondary | ICD-10-CM | POA: Insufficient documentation

## 2017-11-22 DIAGNOSIS — I10 Essential (primary) hypertension: Secondary | ICD-10-CM | POA: Diagnosis not present

## 2017-11-22 DIAGNOSIS — N183 Chronic kidney disease, stage 3 (moderate): Secondary | ICD-10-CM | POA: Diagnosis not present

## 2017-11-22 DIAGNOSIS — I130 Hypertensive heart and chronic kidney disease with heart failure and stage 1 through stage 4 chronic kidney disease, or unspecified chronic kidney disease: Secondary | ICD-10-CM | POA: Diagnosis not present

## 2017-11-22 DIAGNOSIS — I480 Paroxysmal atrial fibrillation: Secondary | ICD-10-CM

## 2017-11-22 DIAGNOSIS — Z7901 Long term (current) use of anticoagulants: Secondary | ICD-10-CM | POA: Diagnosis not present

## 2017-11-22 DIAGNOSIS — Z95811 Presence of heart assist device: Secondary | ICD-10-CM

## 2017-11-22 LAB — LACTATE DEHYDROGENASE: LDH: 457 U/L — ABNORMAL HIGH (ref 98–192)

## 2017-11-22 LAB — COMPREHENSIVE METABOLIC PANEL
ALT: 22 U/L (ref 0–44)
AST: 48 U/L — ABNORMAL HIGH (ref 15–41)
Albumin: 3.5 g/dL (ref 3.5–5.0)
Alkaline Phosphatase: 40 U/L (ref 38–126)
Anion gap: 9 (ref 5–15)
BUN: 21 mg/dL — ABNORMAL HIGH (ref 6–20)
CO2: 25 mmol/L (ref 22–32)
Calcium: 9.4 mg/dL (ref 8.9–10.3)
Chloride: 102 mmol/L (ref 98–111)
Creatinine, Ser: 1.47 mg/dL — ABNORMAL HIGH (ref 0.61–1.24)
GFR calc Af Amer: >60 mL/min (ref >60)
GFR calc non Af Amer: 54 mL/min — ABNORMAL LOW (ref >60)
Glucose, Bld: 194 mg/dL — ABNORMAL HIGH (ref 70–99)
Potassium: 5 mmol/L (ref 3.5–5.1)
Sodium: 136 mmol/L (ref 135–145)
Total Bilirubin: 1.5 mg/dL — ABNORMAL HIGH (ref 0.3–1.2)
Total Protein: 7.6 g/dL (ref 6.5–8.1)

## 2017-11-22 LAB — CBC
HCT: 46.3 % (ref 39.0–52.0)
Hemoglobin: 14.2 g/dL (ref 13.0–17.0)
MCH: 28.3 pg (ref 26.0–34.0)
MCHC: 30.7 g/dL (ref 30.0–36.0)
MCV: 92.4 fL (ref 80.0–100.0)
Platelets: 305 10*3/uL (ref 150–400)
RBC: 5.01 MIL/uL (ref 4.22–5.81)
RDW: 15.5 % (ref 11.5–15.5)
WBC: 7.4 10*3/uL (ref 4.0–10.5)
nRBC: 0 % (ref 0.0–0.2)

## 2017-11-22 LAB — PROTIME-INR
INR: 2.19
Prothrombin Time: 24.1 seconds — ABNORMAL HIGH (ref 11.4–15.2)

## 2017-11-22 LAB — PREALBUMIN: Prealbumin: 24 mg/dL (ref 18–38)

## 2017-11-22 NOTE — Progress Notes (Signed)
LVAD Clinic Note   Primary Cardiologist: Dr. Gala Romney   HPI: Benjamin Sherman is a 47 y.o. 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 ECHO showed severely reduced EF. 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 Placed on steroids without benefit.  Admitted 11/18 after fall/syncopal episode resulting in bifrontal SAH. Neurosurgery consulted.   Admitted 4/19 with cardiogenic shock in setting of AFL. Supported with inotropes and underwent DC-CV but had persistent shock. After numerous discussions about concern for adequate social support underwent placement of HM-3 LVAD on 05/15/17. D/c home 06/04/17  He presents today for regular follow up. Overall he is feeling well. Remains active. Walking regularly without difficulty. Denies CP or SOB. Denies edema. Not taking any lasix.  Had 8 low flow alarms 11/7 between 9-10 pm. He felt poorly during that time, said he hadn't taken his medications and feels like it was running high.  His weight is up 15 lbs since last visit 10/11/17. Says he is eating well. He denies CP or SOB. Denies orthopnea or PND. No fevers or chills. Still having some drainage from his driveline, last RN visit started on Keflex for drainage. Denies melena, BRBPR or neuro symptoms. Taking all medications as prescribed.   VAD Indication: Destination Therapy - due to limited social support. Pt discussed with Dr. Edwena Blow at [facility] prior to LVAD implant.  LVAD assessment: Speed: 5300 RPM Flow: 3.3 Power: 4.0 w PI: 6.7 Alarms: 8 LF on 11/22/17 between 2100-2200 pm.  Events: 30-40 on PI events. Likely elevated BP.   Fixed speed: 5300 Low speed limit: 5000 Primary Controller: Replace back up battery in80months. Back up controller: Replace back up battery in 30months.  Reports taking Coumadin as prescribed and  adherence to anticoagulation based dietary restrictions.  Denies bright red blood per rectum or melena, no dark urine or hematuria.     LVAD equipment check completed and is in good working order. Back-up equipment present.   Review of systems complete and found to be negative unless listed in HPI.    Past Medical History:  Diagnosis Date  . CHF (congestive heart failure) (HCC)   . Diabetes mellitus without complication (HCC)     Current Outpatient Medications  Medication Sig Dispense Refill  . amiodarone (PACERONE) 200 MG tablet Take 1 tablet (200 mg total) by mouth daily. 30 tablet 6  . aspirin 81 MG EC tablet Take 1 tablet (81 mg total) by mouth daily. 30 tablet 6  . ferrous sulfate 325 (65 FE) MG EC tablet Take 325 mg by mouth 2 (two) times daily.    Marland Kitchen gabapentin (NEURONTIN) 300 MG capsule Take 1 capsule (300 mg total) by mouth daily. 30 capsule 6  . pantoprazole (PROTONIX) 40 MG tablet Take 40 mg by mouth daily.    . sacubitril-valsartan (ENTRESTO) 24-26 MG Take 1 tablet by mouth 2 (two) times daily. 60 tablet 6  . warfarin (COUMADIN) 6 MG tablet Take 1/2 tablet (3 mg) daily except 1 tablet (6 mg) on Monday and Friday    . allopurinol (ZYLOPRIM) 100 MG tablet Take 100 mg by mouth daily.    Marland Kitchen enoxaparin (LOVENOX) 40 MG/0.4ML injection Inject 40 mg into the skin every 12 (twelve) hours.     No current facility-administered medications for this encounter.    Allergies  Allergen Reactions  . Bee Venom  UNSPECIFIED REACTION    Social History   Socioeconomic History  . Marital status: Single    Spouse name: Not on file  . Number of children: Not on file  . Years of education: Not on file  . Highest education level: Not on file  Occupational History  . Occupation: Diplomatic Services operational officer Needs  . Financial resource strain: Not on file  . Food insecurity:    Worry: Not on file    Inability: Not on file  . Transportation needs:    Medical: Not on file    Non-medical: Not on  file  Tobacco Use  . Smoking status: Former Smoker    Types: Cigars    Last attempt to quit: 11/16/2016    Years since quitting: 1.0  . Smokeless tobacco: Never Used  . Tobacco comment: 6 cigars per wk  Substance and Sexual Activity  . Alcohol use: Yes    Alcohol/week: 14.0 standard drinks    Types: 12 Cans of beer, 2 Shots of liquor per week    Comment: occ  . Drug use: No  . Sexual activity: Not on file  Lifestyle  . Physical activity:    Days per week: Not on file    Minutes per session: Not on file  . Stress: Not on file  Relationships  . Social connections:    Talks on phone: Not on file    Gets together: Not on file    Attends religious service: Not on file    Active member of club or organization: Not on file    Attends meetings of clubs or organizations: Not on file    Relationship status: Not on file  . Intimate partner violence:    Fear of current or ex partner: Not on file    Emotionally abused: Not on file    Physically abused: Not on file    Forced sexual activity: Not on file  Other Topics Concern  . Not on file  Social History Narrative  . Not on file    Family History  Problem Relation Age of Onset  . CAD Father    Vitals:   11/22/17 1140 11/22/17 1141  BP: 115/79 (!) 90/0  Pulse: 89   SpO2: 100%   Weight: 107.9 kg (237 lb 12.8 oz)     Wt Readings from Last 3 Encounters:  10/11/17 101.1 kg (222 lb 12.8 oz)  09/10/17 97.5 kg (215 lb)  08/13/17 95.8 kg (211 lb 3.2 oz)    Vital Signs:  Doppler Pressure:90 Automatc BP: 115/79 (95) HR: 89 - NSR SPO2: 100%  Weight: 237 lbs Last weight: 222 lbs  General:  NAD.  HEENT: normal  Neck: supple. JVP not elevated.  Carotids 2+ bilat; no bruits. No lymphadenopathy or thryomegaly appreciated. Cor: LVAD hum.  Lungs: Clear. Abdomen: obese soft, nontender, non-distended. No hepatosplenomegaly. No bruits or masses. Good bowel sounds. Driveline site clean. Anchor in place.  Extremities: no cyanosis,  clubbing, rash. Warm no edema  Neuro: alert & oriented x 3. No focal deficits. Moves all 4 without problem    ASSESSMENT & PLAN:  1. Chronic Combined Systolic/Diastolic Heart Failure. NICM  - ECHO 4/19 EF 20% CMRI EF 22%  - s/p HM-3 LVAD on 05/15/17 - Doing very well on VAD support. NYHA I.  - Volume status stable. Encouraged him to stay hydrated, but watch calorie intake with weight gain.  - Continue Entresto 24/26 mg BID for now. If low flows persist will need to consider  switching to losartan  2. VAD management - VAD interrogated personally. Parameters stable.   - Had more PI events and low flow 11/6. Suspect dry - MAPs 90s. Continue current regimen for now  - LDH 276 -> 457. Will need recheck early next week.  - INR 2.19. Discussed dosing with PharmD personally.    3. AFL - Regular on exam.  - Continue amio 200 mg daily for now.   4. HTN - MAPs as above.   5. Hypothyroidism - TFTs OK.  6. DMII - Needs eds to establish with community health and wellness as planned.   I reviewed the LVAD parameters from today, and compared the results to the patient's prior recorded data.  No programming changes were made.  The LVAD is functioning within specified parameters.  The patient performs LVAD self-test daily.  LVAD interrogation was negative for any significant power changes, alarms or PI events/speed drops.  LVAD equipment check completed and is in good working order.  Back-up equipment present.   LVAD education done on emergency procedures and precautions and reviewed exit site care.   Graciella Freer, PA-C 11/22/17   Patient seen and examined with the above-signed Advanced Practice Provider and/or Housestaff. I personally reviewed laboratory data, imaging studies and relevant notes. I independently examined the patient and formulated the important aspects of the plan. I have edited the note to reflect any of my changes or salient points. I have personally discussed the plan  with the patient and/or family.  Overall stable NYHA II. Weight up but volume status looks good. Was having some low flow alarms but have now resolved. Suspect due to volume depletion. Encouraged him to liberalize salt intake. VAD parameters otherwise stable. Maintaining NSR - will continue amio. INR ok. LDH up slightly but no power spikes or dark urine. Will repeat on Monday. If LDH > 500 admit for bival. Discussed warfarin dosing with PharmD personally.  [name], MD  5:47 PM

## 2017-11-22 NOTE — Progress Notes (Signed)
Patient presents for 1 month follow up in VAD Clinic today. Reports no problems with VAD equipment or concerns with drive line.   Pt states that he has been feeling great. Pt says that he is filling his on pillbox based on the instructions on his pill bottles. He brought his medications to clinic today as well as his pillbox.   Vital Signs:  Doppler Pressure: 90 Automatc BP: 115/79 (95) HR: 89-NSR SPO2: 100% on RA  Weight: 237.8 lb w/o eqt Last weight: 222.8 lb  VAD Indication: Destination Therapy - due to limited social support. Pt discussed with Dr. Edwena Blow at [facility] prior to LVAD implant.  LVAD assessment: Speed: 5300 RPM Flow: 3.3 Power: 4.0 w    PI: 6.7 Alarms: pt had LF x 8 last night-pt states the alarm cleared after he took his evening meds Events: 30-40 pi events daily  Fixed speed: 5300 Low speed limit: 5000 Primary Controller: Replace back up battery in 29 months. Back up controller: Replace back up battery in 6months.  I reviewed the LVAD parameters from today and compared the results to the patient's prior recorded data. LVAD interrogation was NEGATIVE for significant power changes, NEGATIVE for clinical alarms and STABLE for PI events/speed drops. No programming changes were made and pump is functioning within specified parameters. Pt is performing daily controller and system monitor self tests along with completing weekly and monthly maintenance for LVAD equipment.  LVAD equipment check completed and is in good working order. Back-up equipment present.   Exit Site Care: Sorbaview dressing w/yellowish drainage visible. Gene is doing twice a week dressing changes. Existing VAD dressing removed and site care performed using sterile technique. Drive line exit site cleaned with Chlora prep applicators x 2, allowed to dry, and gauze dressing with silver strip re-applied. Exit site healed and partially incorporated, the velour is fully implanted at exit site. Small  amount of yellow drainage with tenderness in and around drive line. No redness, foul odor or rash noted. Drive line anchor re-applied. Pt denies fever or chills. Pt instructed to continue twice weekly dressing changes.   Significant Events on VAD Support:  Device:  N/A  BP & Labs:  Doppler 90 - is reflecting MAP  Hgb 14.2 - No S/S of bleeding. Specifically denies melena/BRBPR or nosebleeds.  LDH 457 - above established baseline of 200 - 340. Denies tea-colored urine. No power elevations noted on interrogation. We will recheck LDH/INR on Monday.   Patient Instructions:  1. Continue every other day dressing changes with silver strip. 2. NO Showering, sink bath only. 3. Return to clinic for labs and intermacs completion on Monday. 4. Return for a full visit in 1 month.    [name] RN VAD Coordinator   Office: [phone] 24/7 Emergency VAD Pager: [phone]

## 2017-11-23 ENCOUNTER — Other Ambulatory Visit (HOSPITAL_COMMUNITY): Payer: Self-pay | Admitting: Unknown Physician Specialty

## 2017-11-23 DIAGNOSIS — Z7901 Long term (current) use of anticoagulants: Secondary | ICD-10-CM

## 2017-11-23 DIAGNOSIS — Z95811 Presence of heart assist device: Secondary | ICD-10-CM

## 2017-11-26 ENCOUNTER — Ambulatory Visit (HOSPITAL_COMMUNITY): Payer: Self-pay | Admitting: Pharmacist

## 2017-11-26 ENCOUNTER — Ambulatory Visit (HOSPITAL_COMMUNITY)
Admission: RE | Admit: 2017-11-26 | Discharge: 2017-11-26 | Disposition: A | Discharge status: Home / Self Care | Payer: Medicaid Other | Source: Ambulatory Visit | Attending: Cardiology | Admitting: Cardiology

## 2017-11-26 DIAGNOSIS — Z95811 Presence of heart assist device: Secondary | ICD-10-CM

## 2017-11-26 DIAGNOSIS — Z7901 Long term (current) use of anticoagulants: Secondary | ICD-10-CM | POA: Diagnosis not present

## 2017-11-26 LAB — PROTIME-INR
INR: 2.26
Prothrombin Time: 24.7 seconds — ABNORMAL HIGH (ref 11.4–15.2)

## 2017-11-26 LAB — LACTATE DEHYDROGENASE: LDH: 315 U/L — ABNORMAL HIGH (ref 98–192)

## 2017-11-26 NOTE — Progress Notes (Signed)
6 month Intermacs follow up completed including:  Quality of Life, KCCQ-12, and Neurocognitive trail making.   Pt completed 1400 feet during 6 minute walk.  Provided patient with 6 anchors per his request today.   PharmD will contact patient with INR results and coumadin dosing.   [name] RN, VAD Coordinator 24/7 VAD Pager: [phone]

## 2017-11-26 NOTE — Addendum Note (Signed)
Encounter addended by: Levonne Spiller, RN on: 11/26/2017 2:13 PM  Actions taken: Sign clinical note

## 2017-11-30 ENCOUNTER — Other Ambulatory Visit (HOSPITAL_COMMUNITY): Payer: Self-pay | Admitting: Unknown Physician Specialty

## 2017-11-30 DIAGNOSIS — Z95811 Presence of heart assist device: Secondary | ICD-10-CM

## 2017-11-30 DIAGNOSIS — Z7901 Long term (current) use of anticoagulants: Secondary | ICD-10-CM

## 2017-12-06 ENCOUNTER — Ambulatory Visit (HOSPITAL_COMMUNITY): Payer: Self-pay | Admitting: Pharmacist

## 2017-12-06 ENCOUNTER — Ambulatory Visit (HOSPITAL_COMMUNITY)
Admission: RE | Admit: 2017-12-06 | Discharge: 2017-12-06 | Disposition: A | Discharge status: Home / Self Care | Payer: Medicaid Other | Source: Ambulatory Visit | Attending: Internal Medicine | Admitting: Internal Medicine

## 2017-12-06 DIAGNOSIS — Z7901 Long term (current) use of anticoagulants: Secondary | ICD-10-CM | POA: Diagnosis present

## 2017-12-06 DIAGNOSIS — Z95811 Presence of heart assist device: Secondary | ICD-10-CM | POA: Insufficient documentation

## 2017-12-06 LAB — PROTIME-INR
INR: 2.22
Prothrombin Time: 24.3 seconds — ABNORMAL HIGH (ref 11.4–15.2)

## 2017-12-24 ENCOUNTER — Other Ambulatory Visit (HOSPITAL_COMMUNITY): Payer: Self-pay | Admitting: *Deleted

## 2017-12-24 DIAGNOSIS — Z95811 Presence of heart assist device: Secondary | ICD-10-CM

## 2017-12-24 DIAGNOSIS — Z7901 Long term (current) use of anticoagulants: Secondary | ICD-10-CM

## 2017-12-24 DIAGNOSIS — I5022 Chronic systolic (congestive) heart failure: Secondary | ICD-10-CM

## 2017-12-25 ENCOUNTER — Ambulatory Visit (HOSPITAL_COMMUNITY): Payer: Self-pay | Admitting: Pharmacist

## 2017-12-25 ENCOUNTER — Encounter (HOSPITAL_COMMUNITY): Payer: Self-pay | Admitting: Unknown Physician Specialty

## 2017-12-25 ENCOUNTER — Ambulatory Visit (HOSPITAL_COMMUNITY)
Admission: RE | Admit: 2017-12-25 | Discharge: 2017-12-25 | Disposition: A | Discharge status: Home / Self Care | Payer: Self-pay | Source: Ambulatory Visit | Attending: Cardiology | Admitting: Cardiology

## 2017-12-25 VITALS — BP 118/0 | HR 89 | Ht 72.0 in | Wt 243.8 lb

## 2017-12-25 DIAGNOSIS — Z91030 Bee allergy status: Secondary | ICD-10-CM | POA: Insufficient documentation

## 2017-12-25 DIAGNOSIS — I4892 Unspecified atrial flutter: Secondary | ICD-10-CM | POA: Insufficient documentation

## 2017-12-25 DIAGNOSIS — Z87891 Personal history of nicotine dependence: Secondary | ICD-10-CM | POA: Insufficient documentation

## 2017-12-25 DIAGNOSIS — Z95811 Presence of heart assist device: Secondary | ICD-10-CM

## 2017-12-25 DIAGNOSIS — Z79899 Other long term (current) drug therapy: Secondary | ICD-10-CM | POA: Insufficient documentation

## 2017-12-25 DIAGNOSIS — E039 Hypothyroidism, unspecified: Secondary | ICD-10-CM | POA: Insufficient documentation

## 2017-12-25 DIAGNOSIS — Z8249 Family history of ischemic heart disease and other diseases of the circulatory system: Secondary | ICD-10-CM | POA: Insufficient documentation

## 2017-12-25 DIAGNOSIS — I130 Hypertensive heart and chronic kidney disease with heart failure and stage 1 through stage 4 chronic kidney disease, or unspecified chronic kidney disease: Secondary | ICD-10-CM | POA: Insufficient documentation

## 2017-12-25 DIAGNOSIS — N183 Chronic kidney disease, stage 3 (moderate): Secondary | ICD-10-CM | POA: Insufficient documentation

## 2017-12-25 DIAGNOSIS — I428 Other cardiomyopathies: Secondary | ICD-10-CM | POA: Insufficient documentation

## 2017-12-25 DIAGNOSIS — E1122 Type 2 diabetes mellitus with diabetic chronic kidney disease: Secondary | ICD-10-CM | POA: Insufficient documentation

## 2017-12-25 DIAGNOSIS — I5022 Chronic systolic (congestive) heart failure: Secondary | ICD-10-CM

## 2017-12-25 DIAGNOSIS — Z7901 Long term (current) use of anticoagulants: Secondary | ICD-10-CM | POA: Insufficient documentation

## 2017-12-25 DIAGNOSIS — I5042 Chronic combined systolic (congestive) and diastolic (congestive) heart failure: Secondary | ICD-10-CM | POA: Insufficient documentation

## 2017-12-25 DIAGNOSIS — I10 Essential (primary) hypertension: Secondary | ICD-10-CM

## 2017-12-25 DIAGNOSIS — I480 Paroxysmal atrial fibrillation: Secondary | ICD-10-CM

## 2017-12-25 DIAGNOSIS — Z7982 Long term (current) use of aspirin: Secondary | ICD-10-CM | POA: Insufficient documentation

## 2017-12-25 LAB — CBC
HCT: 46.6 % (ref 39.0–52.0)
Hemoglobin: 14.1 g/dL (ref 13.0–17.0)
MCH: 28.3 pg (ref 26.0–34.0)
MCHC: 30.3 g/dL (ref 30.0–36.0)
MCV: 93.6 fL (ref 80.0–100.0)
Platelets: 270 10*3/uL (ref 150–400)
RBC: 4.98 MIL/uL (ref 4.22–5.81)
RDW: 14.8 % (ref 11.5–15.5)
WBC: 7.1 10*3/uL (ref 4.0–10.5)
nRBC: 0 % (ref 0.0–0.2)

## 2017-12-25 LAB — BASIC METABOLIC PANEL
Anion gap: 14 (ref 5–15)
BUN: 23 mg/dL — ABNORMAL HIGH (ref 6–20)
CO2: 22 mmol/L (ref 22–32)
Calcium: 9.4 mg/dL (ref 8.9–10.3)
Chloride: 100 mmol/L (ref 98–111)
Creatinine, Ser: 1.64 mg/dL — ABNORMAL HIGH (ref 0.61–1.24)
GFR calc Af Amer: 56 mL/min — ABNORMAL LOW (ref >60)
GFR calc non Af Amer: 48 mL/min — ABNORMAL LOW (ref >60)
Glucose, Bld: 219 mg/dL — ABNORMAL HIGH (ref 70–99)
Potassium: 4.8 mmol/L (ref 3.5–5.1)
Sodium: 136 mmol/L (ref 135–145)

## 2017-12-25 LAB — PROTIME-INR
INR: 2.33
Prothrombin Time: 25.2 seconds — ABNORMAL HIGH (ref 11.4–15.2)

## 2017-12-25 LAB — LACTATE DEHYDROGENASE: LDH: 337 U/L — ABNORMAL HIGH (ref 98–192)

## 2017-12-25 MED ORDER — AMLODIPINE BESYLATE 5 MG PO TABS: 5.0000 mg | ORAL_TABLET | Freq: Every day | ORAL | 3 refills | Status: DC

## 2017-12-25 NOTE — Patient Instructions (Signed)
1. Start Norvasc 5 mg daily. 2. Continue twice a week dressing changes. 3. Return to clinic in 1 month.

## 2017-12-25 NOTE — Progress Notes (Signed)
CSW met with patient in the clinic. Patient reports his medicaid apparently was terminated and he is unsure what happened. CSW attempted to contact medicaid office with no success. CSW confirmed inactive medicaid in Epic. CSW encouraged patient to go to [place] [facility] and inquire about his medicaid. Patient later returned call to CSW stating he was told he will need to complete a new application. Patient states he is able to do the application and will call CSW if further needs arise. CSW available as needed. Jackie Brennan, LCSW, CCSW-MCS [phone]  

## 2017-12-25 NOTE — Progress Notes (Signed)
LVAD Clinic Note   Primary Cardiologist: Dr. Gala Romney   HPI: Benjamin Sherman is a 47 y.o. 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 ECHO showed severely reduced EF. 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 Placed on steroids without benefit.  Admitted 11/18 after fall/syncopal episode resulting in bifrontal SAH. Neurosurgery consulted.   Admitted 4/19 with cardiogenic shock in setting of AFL. Supported with inotropes and underwent DC-CV but had persistent shock. After numerous discussions about concern for adequate social support underwent placement of HM-3 LVAD on 05/15/17. D/c home 06/04/17  He presents today for VAD follow up. Last visit LDH was up to 457, but came down to 315 on recheck. He had also had some low flow alarms, thought to be secondary to volume depletion. We instructed him to drink more fluids. Today he is feeling well. No further low flows. He is staying active. Wants to start lifting weights soon. Denies SOB, orthopnea, or edema. No fever or chills. No dark stools or hematuria. No fever or chills. Weight up 5 more lbs from last visit. Taking all medications as prescribed.   VAD Indication: Destination Therapy - due to limited social support. Pt discussed with Dr. Edwena Blow at [facility] prior to LVAD implant.  LVAD assessment: Speed: 5300 RPM Flow: 4.0 Power: 4.0 PI: 6.4 Alarms: None. No more low flows Events: 30-40 PI events daily. Likely elevated BP.   Fixed speed: 5300 Low speed limit: 5000 Primary Controller: Replace back up battery in15months. Back up controller: Replace back up battery in 49months.  LVAD equipment check completed and is in good working order. Back-up equipment present.   Review of systems complete and found to be negative unless listed in HPI.   Past Medical  History:  Diagnosis Date  . CHF (congestive heart failure) (HCC)   . Diabetes mellitus without complication (HCC)     Current Outpatient Medications  Medication Sig Dispense Refill  . amiodarone (PACERONE) 200 MG tablet Take 1 tablet (200 mg total) by mouth daily. 30 tablet 6  . aspirin 81 MG EC tablet Take 1 tablet (81 mg total) by mouth daily. 30 tablet 6  . ferrous sulfate 325 (65 FE) MG EC tablet Take 325 mg by mouth 2 (two) times daily.    Marland Kitchen gabapentin (NEURONTIN) 300 MG capsule Take 1 capsule (300 mg total) by mouth daily. 30 capsule 6  . pantoprazole (PROTONIX) 40 MG tablet Take 40 mg by mouth daily.    . sacubitril-valsartan (ENTRESTO) 24-26 MG Take 1 tablet by mouth 2 (two) times daily. 60 tablet 6  . warfarin (COUMADIN) 6 MG tablet Take 1/2 tablet (3 mg) daily except 1 tablet (6 mg) on Monday and Friday    . allopurinol (ZYLOPRIM) 100 MG tablet Take 100 mg by mouth daily.    Marland Kitchen amLODipine (NORVASC) 5 MG tablet Take 1 tablet (5 mg total) by mouth daily. 180 tablet 3  . enoxaparin (LOVENOX) 40 MG/0.4ML injection Inject 40 mg into the skin every 12 (twelve) hours.     No current facility-administered medications for this encounter.    Allergies  Allergen Reactions  . Bee Venom     UNSPECIFIED REACTION    Social History   Socioeconomic History  . Marital status: Single    Spouse name: Not on file  . Number of children: Not on file  . Years  of education: Not on file  . Highest education level: Not on file  Occupational History  . Occupation: Diplomatic Services operational officer Needs  . Financial resource strain: Not on file  . Food insecurity:    Worry: Not on file    Inability: Not on file  . Transportation needs:    Medical: Not on file    Non-medical: Not on file  Tobacco Use  . Smoking status: Former Smoker    Types: Cigars    Last attempt to quit: 11/16/2016    Years since quitting: 1.1  . Smokeless tobacco: Never Used  . Tobacco comment: 6 cigars per wk  Substance and Sexual  Activity  . Alcohol use: Yes    Alcohol/week: 14.0 standard drinks    Types: 12 Cans of beer, 2 Shots of liquor per week    Comment: occ  . Drug use: No  . Sexual activity: Not on file  Lifestyle  . Physical activity:    Days per week: Not on file    Minutes per session: Not on file  . Stress: Not on file  Relationships  . Social connections:    Talks on phone: Not on file    Gets together: Not on file    Attends religious service: Not on file    Active member of club or organization: Not on file    Attends meetings of clubs or organizations: Not on file    Relationship status: Not on file  . Intimate partner violence:    Fear of current or ex partner: Not on file    Emotionally abused: Not on file    Physically abused: Not on file    Forced sexual activity: Not on file  Other Topics Concern  . Not on file  Social History Narrative  . Not on file    Family History  Problem Relation Age of Onset  . CAD Father    Vitals:   12/25/17 0959 12/25/17 1000  BP: 110/88 (!) 118/0  Pulse: 89   SpO2: 98%   Weight: 110.6 kg (243 lb 12.8 oz)   Height: 6' (1.829 m)     Wt Readings from Last 3 Encounters:  12/25/17 110.6 kg (243 lb 12.8 oz)  11/22/17 107.9 kg (237 lb 12.8 oz)  10/11/17 101.1 kg (222 lb 12.8 oz)    Vital Signs:  Doppler Pressure: 136 Automatc BP: 130/91 (108) HR: 89 NSR SPO2: 98% RA  Weight: 244 lbs Last weight: 237 lbs  General:  NAD.  HEENT: Normal. Edentulous Neck: Supple, JVP not elevated. Carotids OK.  Cardiac:  Mechanical heart sounds with LVAD hum present.  Lungs:  CTAB, normal effort.  Abdomen:  NT, ND, no HSM. No bruits or masses. +BS  LVAD exit site: Dressing dry and intact. No erythema or drainage. Stabilization device present and accurately applied.  Extremities:  Warm and dry. No cyanosis, clubbing, rash, or edema.  Neuro:  Alert & oriented x 3. Cranial nerves grossly intact. Moves all 4 extremities w/o difficulty. Affect pleasant      ASSESSMENT & PLAN:  1. Chronic Combined Systolic/Diastolic Heart Failure. NICM  - ECHO 4/19 EF 20% CMRI EF 22%  - s/p HM-3 LVAD on 05/15/17 - Doing very well on VAD support. NYHA I.  - Volume status stable.  - Continue Entresto 24/26 mg BID for now. If low flows persist will need to consider switching to losartan. Creatinine up a little bit 1.47 > 1.64. May need to stop Entresto if keeps  trending up. Continue to follow.   2. VAD management - VAD interrogated personally. Parameters stable.   - No further low flow alarms.  - MAPs 90-100s. See below. Adding amlodipine.  - LDH 276 -> 457 -> 315 on recheck. LDH today 337. - INR 2.33. Discussed dosing with PharmD personally.  - Continue ASA 81 mg daily + coumadin. INR goal 2-2.5.  3. AFL - Regular on exam.  - Continue amio 200 mg daily for now. AST mildly elevated at 48, ALT normal 11/2017. TSH mildly elevated 06/2017 at 5.4, but T3 and T4 normal. Recheck TFTs next visit.   4. HTN - MAPs 90-100s today.  - Add amlodipine 5 mg daily. Doubt he will tolerate higher doses of Entesto due to diuretic effect.   5. Hypothyroidism - TSH mildly elevated 06/2017 at 5.4, but T3 and T4 normal. Recheck TFTs next visit.   6. DMII - Needs to establish with community health and wellness as planned. No change.  I reviewed the LVAD parameters from today, and compared the results to the patient's prior recorded data.  No programming changes were made.  The LVAD is functioning within specified parameters.  The patient performs LVAD self-test daily.  LVAD interrogation was negative for any significant power changes, alarms or PI events/speed drops.  LVAD equipment check completed and is in good working order.  Back-up equipment present.   LVAD education done on emergency procedures and precautions and reviewed exit site care.   Alford Highland, NP 12/25/17   Patient seen and examined with the above-signed Advanced Practice Provider and/or Housestaff. I  personally reviewed laboratory data, imaging studies and relevant notes. I independently examined the patient and formulated the important aspects of the plan. I have edited the note to reflect any of my changes or salient points. I have personally discussed the plan with the patient and/or family.  Overall doing well. Low flow alarms improved with increasing volume intake. VAD interrogated personally. Parameters stable. MAPs remain elevated. Agree with amlodipine. INR 2.33. Discussed dosing with PharmD personally. Remains in NSR. Continue low dose amio. Follow renal function.   [name], MD  4:11 PM

## 2017-12-25 NOTE — Progress Notes (Signed)
Patient presents for 1 month follow up in VAD Clinic today. Reports no problems with VAD equipment or concerns with drive line.   Pt states that he has not been walking as he was previously walking daily at the mall in [place]. The pt has gained 6 lbs. Brett Canales says he feels great.  Vital Signs:  Doppler Pressure: 118 Automatc BP: 110/88 (94) HR: 89-NSR SPO2: 98% on RA  Weight: 243.8 lb w/o eqt Last weight: 237.8 lb  VAD Indication: Destination Therapy - due to limited social support. Pt discussed with Dr. Edwena Blow at [facility] prior to LVAD implant.  LVAD assessment: Speed: 5300 RPM Flow: 4.0 Power: 4.0 w    PI: 6.4 Alarms: no more low flows Events: 30-40 pi events daily  Fixed speed: 5300 Low speed limit: 5000 Primary Controller: Replace back up battery in 26 months. Back up controller: Replace back up battery in 7months.  I reviewed the LVAD parameters from today and compared the results to the patient's prior recorded data. LVAD interrogation was NEGATIVE for significant power changes, NEGATIVE for clinical alarms and STABLE for PI events/speed drops. No programming changes were made and pump is functioning within specified parameters. Pt is performing daily controller and system monitor self tests along with completing weekly and monthly maintenance for LVAD equipment.  LVAD equipment check completed and is in good working order. Back-up equipment present.   Exit Site Care: Sorbaview dressing w/yellowish drainage visible. Gene is doing twice a week dressing changes. Existing VAD dressing removed and site care performed using sterile technique. Drive line exit site cleaned with Chlora prep applicators x 2, allowed to dry, and gauze dressing with silver strip re-applied. Exit site healed and incorporated, the velour is fully implanted at exit site. Moderate amount of yellow drainage with no tenderness in and around drive line. No redness, foul odor or rash noted. Drive line  anchor re-applied. Pt denies fever or chills. Pt instructed to continue twice weekly dressing changes.       Significant Events on VAD Support:  Device:  N/A  BP & Labs:  Doppler 118 - is reflecting Modified systolic  Hgb 14.1 - No S/S of bleeding. Specifically denies melena/BRBPR or nosebleeds.  LDH 337 - above established baseline of 200 - 340. Denies tea-colored urine. No power elevations noted on interrogation. We will recheck LDH/INR on Monday.   Patient Instructions:  1. Start Norvasc 5 mg daily. 2. Continue twice a week dressing changes. 3. Return to clinic in 1 month.    [name] RN VAD Coordinator   Office: [phone] 24/7 Emergency VAD Pager: [phone]

## 2017-12-26 ENCOUNTER — Telehealth (HOSPITAL_COMMUNITY): Payer: Self-pay | Admitting: Licensed Clinical Social Worker

## 2017-12-26 NOTE — Telephone Encounter (Signed)
CSW received return call form patient today stating that he was told that his medicaid was terminated because he is over income. Patient provided CSW with name and contact number of DSS worker. CSW contacted and left message for return call. CSW will discuss possibility of deductible medicaid option. Lasandra Beech, LCSW, CCSW-MCS [phone]

## 2018-01-04 ENCOUNTER — Other Ambulatory Visit (HOSPITAL_COMMUNITY): Payer: Self-pay | Admitting: *Deleted

## 2018-01-04 DIAGNOSIS — Z7901 Long term (current) use of anticoagulants: Secondary | ICD-10-CM

## 2018-01-04 DIAGNOSIS — Z95811 Presence of heart assist device: Secondary | ICD-10-CM

## 2018-01-07 ENCOUNTER — Other Ambulatory Visit (HOSPITAL_COMMUNITY): Payer: Self-pay

## 2018-01-21 ENCOUNTER — Other Ambulatory Visit (HOSPITAL_COMMUNITY): Payer: Self-pay | Admitting: *Deleted

## 2018-01-21 ENCOUNTER — Telehealth (HOSPITAL_COMMUNITY): Payer: Self-pay

## 2018-01-21 DIAGNOSIS — Z95811 Presence of heart assist device: Secondary | ICD-10-CM

## 2018-01-21 DIAGNOSIS — I5022 Chronic systolic (congestive) heart failure: Secondary | ICD-10-CM

## 2018-01-21 DIAGNOSIS — I10 Essential (primary) hypertension: Secondary | ICD-10-CM

## 2018-01-21 DIAGNOSIS — E8779 Other fluid overload: Secondary | ICD-10-CM

## 2018-01-21 MED ORDER — GABAPENTIN 300 MG PO CAPS: 300.0000 mg | ORAL_CAPSULE | Freq: Every day | ORAL | 0 refills | Status: DC

## 2018-01-21 MED ORDER — SACUBITRIL-VALSARTAN 24-26 MG PO TABS: 1.0000 | ORAL_TABLET | Freq: Two times a day (BID) | ORAL | 6 refills | Status: DC

## 2018-01-21 MED FILL — GABAPENTIN 300 MG CAPSULE: 300 | 90 days supply | Qty: 90 | Fill #0

## 2018-01-21 NOTE — Telephone Encounter (Signed)
Printed of script for [employer]

## 2018-01-22 ENCOUNTER — Encounter (HOSPITAL_COMMUNITY): Payer: Self-pay | Admitting: Licensed Clinical Social Worker

## 2018-01-22 NOTE — Progress Notes (Signed)
CSW met with patient in the clinic to assist with prescription assistance and insurance options. Patient reports his Medicaid was terminated last month as he was told he is now over the income limit due to recent disability income from Social security. CSW assisted with obtaining Gabapentin from the outpatient pharmacy and applied for the [facility] Patient Assistance Program for ongoing help with [facility]. CSW and patient contacted the Healthcare Marketplace to obtain an appointment to explore options and costs through the Marketplace. He was told that the [facility] has not opened their schedules for the new year yet but he would receive a call with an appointment by the end of the week. Patient appears to understand follow up needed and will return call to CSW if needed. CSW followed up with VAD Coordinators to update on status of meds and insurance. CSW will assist as needed going further. Raquel Sarna, [place], [place]

## 2018-01-24 ENCOUNTER — Telehealth (HOSPITAL_COMMUNITY): Payer: Self-pay | Admitting: Licensed Clinical Social Worker

## 2018-01-24 ENCOUNTER — Other Ambulatory Visit (HOSPITAL_COMMUNITY): Payer: Self-pay | Admitting: Unknown Physician Specialty

## 2018-01-24 DIAGNOSIS — Z7901 Long term (current) use of anticoagulants: Secondary | ICD-10-CM

## 2018-01-24 DIAGNOSIS — Z95811 Presence of heart assist device: Secondary | ICD-10-CM

## 2018-01-24 NOTE — Telephone Encounter (Signed)
Completed application for Entresto assistance sent to [employer] for review- fax confirmation received  Burna Sis, LCSW Clinical Social Worker [facility] [phone]

## 2018-01-25 ENCOUNTER — Ambulatory Visit (HOSPITAL_COMMUNITY): Payer: Self-pay | Admitting: Pharmacist

## 2018-01-25 ENCOUNTER — Ambulatory Visit (HOSPITAL_COMMUNITY)
Admission: RE | Admit: 2018-01-25 | Discharge: 2018-01-25 | Disposition: A | Discharge status: Home / Self Care | Payer: Medicaid Other | Source: Ambulatory Visit | Attending: Cardiology | Admitting: Cardiology

## 2018-01-25 ENCOUNTER — Other Ambulatory Visit (HOSPITAL_COMMUNITY): Payer: Self-pay | Admitting: *Deleted

## 2018-01-25 ENCOUNTER — Encounter (HOSPITAL_COMMUNITY): Payer: Self-pay

## 2018-01-25 VITALS — BP 103/61 | HR 104 | Wt 247.8 lb

## 2018-01-25 DIAGNOSIS — Z95811 Presence of heart assist device: Secondary | ICD-10-CM

## 2018-01-25 DIAGNOSIS — I5022 Chronic systolic (congestive) heart failure: Secondary | ICD-10-CM

## 2018-01-25 DIAGNOSIS — I480 Paroxysmal atrial fibrillation: Secondary | ICD-10-CM

## 2018-01-25 DIAGNOSIS — T827XXA Infection and inflammatory reaction due to other cardiac and vascular devices, implants and grafts, initial encounter: Secondary | ICD-10-CM

## 2018-01-25 DIAGNOSIS — Z7901 Long term (current) use of anticoagulants: Secondary | ICD-10-CM

## 2018-01-25 LAB — BASIC METABOLIC PANEL
Anion gap: 6 (ref 5–15)
BUN: 23 mg/dL — ABNORMAL HIGH (ref 6–20)
CO2: 26 mmol/L (ref 22–32)
Calcium: 8.9 mg/dL (ref 8.9–10.3)
Chloride: 105 mmol/L (ref 98–111)
Creatinine, Ser: 1.65 mg/dL — ABNORMAL HIGH (ref 0.61–1.24)
GFR calc Af Amer: 56 mL/min — ABNORMAL LOW (ref >60)
GFR calc non Af Amer: 48 mL/min — ABNORMAL LOW (ref >60)
Glucose, Bld: 197 mg/dL — ABNORMAL HIGH (ref 70–99)
Potassium: 4.6 mmol/L (ref 3.5–5.1)
Sodium: 137 mmol/L (ref 135–145)

## 2018-01-25 LAB — CBC
HCT: 45.5 % (ref 39.0–52.0)
Hemoglobin: 14.2 g/dL (ref 13.0–17.0)
MCH: 29.3 pg (ref 26.0–34.0)
MCHC: 31.2 g/dL (ref 30.0–36.0)
MCV: 93.8 fL (ref 80.0–100.0)
Platelets: 214 10*3/uL (ref 150–400)
RBC: 4.85 MIL/uL (ref 4.22–5.81)
RDW: 14.3 % (ref 11.5–15.5)
WBC: 5.6 10*3/uL (ref 4.0–10.5)
nRBC: 0 % (ref 0.0–0.2)

## 2018-01-25 LAB — LACTATE DEHYDROGENASE: LDH: 367 U/L — ABNORMAL HIGH (ref 98–192)

## 2018-01-25 LAB — HEPATIC FUNCTION PANEL
ALT: 24 U/L (ref 0–44)
AST: 40 U/L (ref 15–41)
Albumin: 3.3 g/dL — ABNORMAL LOW (ref 3.5–5.0)
Alkaline Phosphatase: 35 U/L — ABNORMAL LOW (ref 38–126)
Bilirubin, Direct: 0.4 mg/dL — ABNORMAL HIGH (ref 0.0–0.2)
Indirect Bilirubin: 1 mg/dL — ABNORMAL HIGH (ref 0.3–0.9)
Total Bilirubin: 1.4 mg/dL — ABNORMAL HIGH (ref 0.3–1.2)
Total Protein: 7.1 g/dL (ref 6.5–8.1)

## 2018-01-25 LAB — PROTIME-INR
INR: 2.17
Prothrombin Time: 23.9 seconds — ABNORMAL HIGH (ref 11.4–15.2)

## 2018-01-25 MED ORDER — CEPHALEXIN 250 MG PO CAPS: 250.0000 mg | ORAL_CAPSULE | Freq: Three times a day (TID) | ORAL | 0 refills | Status: AC

## 2018-01-25 MED FILL — CEPHALEXIN 250 MG CAPSULE: 250 | 7 days supply | Qty: 21 | Fill #0

## 2018-01-25 NOTE — Progress Notes (Signed)
LVAD Clinic Note   Primary Cardiologist: Dr. Gala Romney   HPI: Benjamin Sherman is a 48 y.o. 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 ECHO showed severely reduced EF. 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 Placed on steroids without benefit.  Admitted 11/18 after fall/syncopal episode resulting in bifrontal SAH. Neurosurgery consulted.   Admitted 4/19 with cardiogenic shock in setting of AFL. Supported with inotropes and underwent DC-CV but had persistent shock. After numerous discussions about concern for adequate social support underwent placement of HM-3 LVAD on 05/15/17. D/c home 06/04/17  Today he returns for VAD follow up. Overall feeling fine. Denies SOB/PND/Orthopnea. Appetite ok. No fever or chills. No bleeding issues. Dressing is being changed twice a week.  Denies dropping controller. Walking 30 minutes 4 days a week at the mall. Taking all medications.  VAD Indication: Destination Therapy - due to limited social support. Pt discussed with Dr. Edwena Blow at [facility] prior to LVAD implant.  LVAD assessment: Speed: 5300 RPM Flow: 4.1 Power: 4.0 PI: 6.1  Alarms: None. No more low flows Events: 40-50 PI events. Suspect he is dry.    Fixed speed: 5300 Low speed limit: 5000 Primary Controller: Replace back up battery in24months. Back up controller: Replace back up battery in 49months.  LVAD equipment check completed and is in good working order. Back-up equipment present.   Review of systems complete and found to be negative unless listed in HPI.   Past Medical History:  Diagnosis Date  . CHF (congestive heart failure) (HCC)   . Diabetes mellitus without complication (HCC)     Current Outpatient Medications  Medication Sig Dispense Refill  . allopurinol (ZYLOPRIM) 100 MG tablet Take 100 mg by  mouth daily.    Marland Kitchen amiodarone (PACERONE) 200 MG tablet Take 1 tablet (200 mg total) by mouth daily. 30 tablet 6  . amLODipine (NORVASC) 5 MG tablet Take 1 tablet (5 mg total) by mouth daily. 180 tablet 3  . aspirin 81 MG EC tablet Take 1 tablet (81 mg total) by mouth daily. 30 tablet 6  . ferrous sulfate 325 (65 FE) MG EC tablet Take 325 mg by mouth 2 (two) times daily.    Marland Kitchen gabapentin (NEURONTIN) 300 MG capsule Take 1 capsule (300 mg total) by mouth daily. 90 capsule 0  . pantoprazole (PROTONIX) 40 MG tablet Take 40 mg by mouth daily.    . sacubitril-valsartan (ENTRESTO) 24-26 MG Take 1 tablet by mouth 2 (two) times daily. 60 tablet 6  . warfarin (COUMADIN) 6 MG tablet Take 1/2 tablet (3 mg) daily except 1 tablet (6 mg) on Monday and Friday    . cephALEXin (KEFLEX) 250 MG capsule Take 1 capsule (250 mg total) by mouth 3 (three) times daily for 7 days. 21 capsule 0  . enoxaparin (LOVENOX) 40 MG/0.4ML injection Inject 40 mg into the skin every 12 (twelve) hours.     No current facility-administered medications for this encounter.    Allergies  Allergen Reactions  . Bee Venom     UNSPECIFIED REACTION    Social History   Socioeconomic History  . Marital status: Single    Spouse name: Not on file  . Number of children: Not on file  . Years of education: Not on file  . Highest education level: Not on file  Occupational History  . Occupation: Diplomatic Services operational officer  Needs  . Financial resource strain: Not on file  . Food insecurity:    Worry: Not on file    Inability: Not on file  . Transportation needs:    Medical: Not on file    Non-medical: Not on file  Tobacco Use  . Smoking status: Former Smoker    Types: Cigars    Last attempt to quit: 11/16/2016    Years since quitting: 1.1  . Smokeless tobacco: Never Used  . Tobacco comment: 6 cigars per wk  Substance and Sexual Activity  . Alcohol use: Yes    Alcohol/week: 14.0 standard drinks    Types: 12 Cans of beer, 2 Shots of liquor per  week    Comment: occ  . Drug use: No  . Sexual activity: Not on file  Lifestyle  . Physical activity:    Days per week: Not on file    Minutes per session: Not on file  . Stress: Not on file  Relationships  . Social connections:    Talks on phone: Not on file    Gets together: Not on file    Attends religious service: Not on file    Active member of club or organization: Not on file    Attends meetings of clubs or organizations: Not on file    Relationship status: Not on file  . Intimate partner violence:    Fear of current or ex partner: Not on file    Emotionally abused: Not on file    Physically abused: Not on file    Forced sexual activity: Not on file  Other Topics Concern  . Not on file  Social History Narrative  . Not on file    Family History  Problem Relation Age of Onset  . CAD Father    Vitals:   01/25/18 1023 01/25/18 1024  BP: (!) 104/0 103/61  Pulse: (!) 104   SpO2: 98%   Weight: 112.4 kg (247 lb 12.8 oz)     Wt Readings from Last 3 Encounters:  01/25/18 112.4 kg (247 lb 12.8 oz)  12/25/17 110.6 kg (243 lb 12.8 oz)  11/22/17 107.9 kg (237 lb 12.8 oz)    Vital Signs:  Doppler Pressure: 104 Automatc BP: 103/61 (79)  HR: 82 SPO2: 98%  Weight: 247 lbs Last weight: 244 lbs  Physical Exam: GENERAL: Well appearing, male who presents to clinic today in no acute distress. HEENT: normal  NECK: Supple, JVP flat  .  2+ bilaterally, no bruits.  No lymphadenopathy or thyromegaly appreciated.   CARDIAC:  Mechanical heart sounds with LVAD hum present.  LUNGS:  Clear to auscultation bilaterally.  ABDOMEN:  Soft, round, nontender, positive bowel sounds x4.     LVAD exit site:   Dressing dry and intact.  No erythema. +++ drainage. Nontender.  Stabilization device present and accurately applied.  Driveline dressing is being changed daily per sterile technique. EXTREMITIES:  Warm and dry, no cyanosis, clubbing, rash or edema  NEUROLOGIC:  Alert and oriented x  4.  Gait steady.  No aphasia.  No dysarthria.  Affect pleasant.       ASSESSMENT & PLAN:  1. Chronic Combined Systolic/Diastolic Heart Failure. NICM  - ECHO 4/19 EF 20% CMRI EF 22%  - s/p HM-3 LVAD on 05/15/17 -NYHA I. Volume status stable. Needs to drink more fluids.  - Continue Entresto 24/26 mg BID for now.  -Check BMET if creatinine high will need to stop entresto and start losartan.   2. VAD  management Driveline with serous exudate expressed. CX exudate, check CBC, and start keflex 250 mg three times a day for 7 days.  - VAD interrogated personally. 40-50 PI events needs to drink extra fluid.  - No further low flow alarms.  -Check LDH and INR today. - Continue ASA 81 mg daily + coumadin. INR goal 2-2.5.  3. AFL - Regular on exam.  - Continue amio 200 mg daily for now. AST mildly elevated at 48, ALT normal 11/2017. TSH mildly elevated 06/2017 at 5.4, but T3 and T4 normal. Recheck TFTs next visit.   4. HTN -Map stable today  - Continue amlodipine 5 mg daily and entresto.    5. Hypothyroidism - TSH mildly elevated 06/2017 at 5.4, but T3 and T4 normal.    6. DMII - Needs to establish with community health and wellness as planned.  I reviewed the LVAD parameters from today, and compared the results to the patient's prior recorded data.  No programming changes were made.  The LVAD is functioning within specified parameters.  The patient performs LVAD self-test daily.  LVAD interrogation was negative for any significant power changes, alarms or PI events/speed drops.  LVAD equipment check completed and is in good working order.  Back-up equipment present.   LVAD education done on emergency procedures and precautions and reviewed exit site care.   Tonye Becket, NP 01/25/18   Patient seen and examined with Tonye Becket, NP. We discussed all aspects of the encounter. I agree with the assessment and plan as stated above.   Overall doing well NYHA I. Volume status stable. Had recent driveline  trauma and now with drainage. Will treat with Keflex. Stressed need to anchor device better and we showed him 2 anchor method. VAD interrogated personally. Still with frequent PI events. LDH and hgb stable. Encouraged him to keep up with his fluid intake. INR 2.17. Discussed warfarin dosing with PharmD personally.  [name], MD  11:38 PM

## 2018-01-25 NOTE — Patient Instructions (Addendum)
1. Start Keflex 250mg  three times a day for 7 days.  2. Daily drive line dressing changes with gauze dressing and silver strip.  3. Continue current Coumadin regime: 6 mg Mon & Friday, 3 mg all other days.  4. Return to VAD clinic in 1 week.

## 2018-01-25 NOTE — Progress Notes (Signed)
Patient presents for 1 month follow up in VAD Clinic today. Reports no problems with VAD equipment or concerns with drive line.   Pt states that he has been walking daily at the mall in [place]. The pt has gained 4 lbs. Brett Canales says he feels great, but is discouraged by his weight gain. He says he has been eating a lot.   PI elevated on interrogation. Discussed need to drink 2L of fluid daily.    States that he dropped his controller once when he was getting out of bed, but otherwise denies trauma to site.   Vital Signs:  Doppler Pressure: 104 Automatc BP: 103/61 (79) HR: 82 SPO2: 98% on RA  Weight: 247.8 lb w/o eqt Last weight: 243.8 lb  VAD Indication: Destination Therapy - due to limited social support. Pt discussed with Dr. Edwena Blow at [facility] prior to LVAD implant.  LVAD assessment: Speed: 5300 RPM Flow: 4.1 Power: 4.0 w    PI: 5.7 Alarms: none Events: 40-50 PI events daily   Fixed speed: 5300 Low speed limit: 5000 Primary Controller: Replace back up battery in 25 months. Back up controller: Replace back up battery in 54months.  I reviewed the LVAD parameters from today and compared the results to the patient's prior recorded data. LVAD interrogation was NEGATIVE for significant power changes, NEGATIVE for clinical alarms and STABLE for PI events/speed drops. No programming changes were made and pump is functioning within specified parameters. Pt is performing daily controller and system monitor self tests along with completing weekly and monthly maintenance for LVAD equipment.  LVAD equipment check completed and is in good working order. Back-up equipment present.   Exit Site Care: Gene is doing twice a week dressing changes. Existing VAD dressing removed and site care performed using sterile technique. Drive line exit site cleaned with Chlora prep applicators x 2, allowed to dry, and gauze dressing with silver strip re-applied. Exit site healed and incorporated, the  velour is fully implanted at exit site. Moderate amount of yellow/brown drainage on gauze with no tenderness in and around drive line. Able to express a moderate amount of clear-yellow drainage from exit site; wound culture sent. No redness, foul odor or rash noted. Drive line anchor re-applied. Pt denies fever or chills. Pt instructed to change dressing daily. Provided with 10 daily kits and 10 anchors.        Significant Events on VAD Support:  Device:  N/A  BP & Labs:  Doppler 104 - is reflecting Modified systolic  Hgb 14.2  - No S/S of bleeding. Specifically denies melena/BRBPR or nosebleeds.  LDH pending - above established baseline of 200 - 340. Denies tea-colored urine. No power elevations noted on interrogation. We will recheck LDH/INR on Monday.   Patient Instructions:  1. Start Keflex 250mg  TID x 7 days. 2. Daily dressing changes.  3. Return to clinic in 1 week.   Alyce Pagan RN VAD Coordinator  Office: [phone]  24/7 Pager: [phone]

## 2018-01-27 LAB — AEROBIC CULTURE  (SUPERFICIAL SPECIMEN)
Culture: NO GROWTH
Gram Stain: NONE SEEN

## 2018-01-30 ENCOUNTER — Telehealth: Payer: Self-pay

## 2018-01-30 ENCOUNTER — Telehealth (HOSPITAL_COMMUNITY): Payer: Self-pay | Admitting: Licensed Clinical Social Worker

## 2018-01-30 ENCOUNTER — Other Ambulatory Visit (HOSPITAL_COMMUNITY): Payer: Self-pay | Admitting: *Deleted

## 2018-01-30 DIAGNOSIS — Z7901 Long term (current) use of anticoagulants: Secondary | ICD-10-CM

## 2018-01-30 DIAGNOSIS — I5043 Acute on chronic combined systolic (congestive) and diastolic (congestive) heart failure: Secondary | ICD-10-CM

## 2018-01-30 DIAGNOSIS — Z95811 Presence of heart assist device: Secondary | ICD-10-CM

## 2018-01-30 NOTE — Telephone Encounter (Signed)
CSW attempted to call patient to follow up on PCP appointment. CSW will see patient in clinic tomorrow. Lasandra Beech, LCSW, CCSW-MCS [phone]

## 2018-01-30 NOTE — Telephone Encounter (Signed)
Request received from Lasandra Beech, LCSW for appointment for patient to establish care at [facility]. Notified her that an appointment has been scheduled for 02/19/2018 @ 1030

## 2018-01-31 ENCOUNTER — Telehealth (HOSPITAL_COMMUNITY): Payer: Self-pay | Admitting: *Deleted

## 2018-01-31 ENCOUNTER — Ambulatory Visit (HOSPITAL_COMMUNITY)
Admission: RE | Admit: 2018-01-31 | Discharge: 2018-01-31 | Disposition: A | Discharge status: Home / Self Care | Payer: Medicaid Other | Source: Ambulatory Visit | Attending: Internal Medicine | Admitting: Internal Medicine

## 2018-01-31 ENCOUNTER — Ambulatory Visit (HOSPITAL_COMMUNITY): Payer: Self-pay | Admitting: Pharmacist

## 2018-01-31 VITALS — BP 121/63 | HR 97 | Ht 72.0 in | Wt 247.8 lb

## 2018-01-31 DIAGNOSIS — Z8249 Family history of ischemic heart disease and other diseases of the circulatory system: Secondary | ICD-10-CM | POA: Insufficient documentation

## 2018-01-31 DIAGNOSIS — E1122 Type 2 diabetes mellitus with diabetic chronic kidney disease: Secondary | ICD-10-CM | POA: Insufficient documentation

## 2018-01-31 DIAGNOSIS — T827XXA Infection and inflammatory reaction due to other cardiac and vascular devices, implants and grafts, initial encounter: Secondary | ICD-10-CM

## 2018-01-31 DIAGNOSIS — Z91030 Bee allergy status: Secondary | ICD-10-CM | POA: Insufficient documentation

## 2018-01-31 DIAGNOSIS — Z95811 Presence of heart assist device: Secondary | ICD-10-CM

## 2018-01-31 DIAGNOSIS — E8779 Other fluid overload: Secondary | ICD-10-CM

## 2018-01-31 DIAGNOSIS — I5042 Chronic combined systolic (congestive) and diastolic (congestive) heart failure: Secondary | ICD-10-CM | POA: Insufficient documentation

## 2018-01-31 DIAGNOSIS — I130 Hypertensive heart and chronic kidney disease with heart failure and stage 1 through stage 4 chronic kidney disease, or unspecified chronic kidney disease: Secondary | ICD-10-CM | POA: Insufficient documentation

## 2018-01-31 DIAGNOSIS — I10 Essential (primary) hypertension: Secondary | ICD-10-CM

## 2018-01-31 DIAGNOSIS — Z7901 Long term (current) use of anticoagulants: Secondary | ICD-10-CM

## 2018-01-31 DIAGNOSIS — Z7982 Long term (current) use of aspirin: Secondary | ICD-10-CM | POA: Insufficient documentation

## 2018-01-31 DIAGNOSIS — I5022 Chronic systolic (congestive) heart failure: Secondary | ICD-10-CM

## 2018-01-31 DIAGNOSIS — Z87891 Personal history of nicotine dependence: Secondary | ICD-10-CM | POA: Insufficient documentation

## 2018-01-31 DIAGNOSIS — E039 Hypothyroidism, unspecified: Secondary | ICD-10-CM | POA: Insufficient documentation

## 2018-01-31 DIAGNOSIS — Z79899 Other long term (current) drug therapy: Secondary | ICD-10-CM | POA: Insufficient documentation

## 2018-01-31 DIAGNOSIS — N183 Chronic kidney disease, stage 3 (moderate): Secondary | ICD-10-CM | POA: Insufficient documentation

## 2018-01-31 DIAGNOSIS — I5043 Acute on chronic combined systolic (congestive) and diastolic (congestive) heart failure: Secondary | ICD-10-CM

## 2018-01-31 DIAGNOSIS — I428 Other cardiomyopathies: Secondary | ICD-10-CM | POA: Insufficient documentation

## 2018-01-31 LAB — COMPREHENSIVE METABOLIC PANEL
ALT: 24 U/L (ref 0–44)
AST: 39 U/L (ref 15–41)
Albumin: 3.4 g/dL — ABNORMAL LOW (ref 3.5–5.0)
Alkaline Phosphatase: 36 U/L — ABNORMAL LOW (ref 38–126)
Anion gap: 9 (ref 5–15)
BUN: 17 mg/dL (ref 6–20)
CO2: 23 mmol/L (ref 22–32)
Calcium: 9.5 mg/dL (ref 8.9–10.3)
Chloride: 107 mmol/L (ref 98–111)
Creatinine, Ser: 1.59 mg/dL — ABNORMAL HIGH (ref 0.61–1.24)
GFR calc Af Amer: 58 mL/min — ABNORMAL LOW (ref >60)
GFR calc non Af Amer: 50 mL/min — ABNORMAL LOW (ref >60)
Glucose, Bld: 160 mg/dL — ABNORMAL HIGH (ref 70–99)
Potassium: 4.9 mmol/L (ref 3.5–5.1)
Sodium: 139 mmol/L (ref 135–145)
Total Bilirubin: 1.4 mg/dL — ABNORMAL HIGH (ref 0.3–1.2)
Total Protein: 7.3 g/dL (ref 6.5–8.1)

## 2018-01-31 LAB — PROTIME-INR
INR: 2.62
Prothrombin Time: 27.6 seconds — ABNORMAL HIGH (ref 11.4–15.2)

## 2018-01-31 LAB — CBC
HCT: 44.2 % (ref 39.0–52.0)
Hemoglobin: 14.2 g/dL (ref 13.0–17.0)
MCH: 29.6 pg (ref 26.0–34.0)
MCHC: 32.1 g/dL (ref 30.0–36.0)
MCV: 92.1 fL (ref 80.0–100.0)
Platelets: 223 10*3/uL (ref 150–400)
RBC: 4.8 MIL/uL (ref 4.22–5.81)
RDW: 14.2 % (ref 11.5–15.5)
WBC: 9.1 10*3/uL (ref 4.0–10.5)
nRBC: 0 % (ref 0.0–0.2)

## 2018-01-31 LAB — LACTATE DEHYDROGENASE: LDH: 377 U/L — ABNORMAL HIGH (ref 98–192)

## 2018-01-31 MED ORDER — SACUBITRIL-VALSARTAN 49-51 MG PO TABS: 1.0000 | ORAL_TABLET | Freq: Two times a day (BID) | ORAL | 60 refills | Status: DC

## 2018-01-31 NOTE — Progress Notes (Signed)
CSW met with patient in the clinic to discuss scheduled PCP appointment. Patient verbalizes understanding of appointment and where to go to establish care with [facility] and wellness. Appointment is for 02/19/2018 @ 10:30am. Raquel Sarna, [place], [place]

## 2018-01-31 NOTE — Progress Notes (Signed)
Patient presents for 1 week follow up in VAD Clinic today. Reports no problems with VAD equipment or concerns with drive line.   MAP elevated today, pt says he took his meds this am. He reports he is filling his own pillbox and "forgot to bring meds or med sheet" to clinic visit today. Reminded him to always bring med sheet and meds to each visit. He verbalized agreement to same.   Pt states that he has been walking daily for 30 mins at the mall in [place]; wt today stable from last visit.    PI elevated on interrogation along with elevated MAP. Pt says he has increased his fluid intake to 2 liters/daily and has added salt back in his diet.   States since DL trauma and beginning of Keflex, Gene has been changing his DL dressing @ every two days. Gene has been unable to change daily due to his busy work schedule. Pt says he will complete Keflex Saturday, 02/02/18. Dressing changed today - see below.  Vital Signs:       Standing: Doppler Pressure: 118 Automatc BP: 121/63 (106)    110/94 (101) HR: 97       93 SPO2: 100% on RA     100%  Weight: 247.8 lb w/o eqt  Last weight:247.8 lb  VAD Indication: Destination Therapy - due to limited social support. [facility] prior to LVAD implant.  LVAD assessment:      Speed: 5300 RPM     Speed:  5300 PRM Flow: 4.0      Flow:  3.3 Power: 4.1 w        Power:  4.1 PI: 6.9       PI:  7.2 Alarms: none Events: 56 today; > 60 on 01/30/18 Hct: 20      Hct:  45   Fixed speed: 5300 Low speed limit: 5000  Primary Controller: Replace back up battery in 25 months. Back up controller: Replace back up battery in 26months.  I reviewed the LVAD parameters from today and compared the results to the patient's prior recorded data. LVAD interrogation was NEGATIVE for significant power changes, NEGATIVE for clinical alarms and STABLE for PI events/speed drops. No programming changes were made and pump is functioning within specified  parameters. Pt is performing daily controller and system monitor self tests along with completing weekly and monthly maintenance for LVAD equipment.  LVAD equipment check completed and is in good working order. Back-up equipment present.   Exit Site Care: Gene is doing every other day dressing changes. Existing VAD dressing removed and site care performed using sterile technique. Drive line exit site cleaned with Chlora prep applicators x 2, allowed to dry, and gauze dressing with silver strip re-applied. Exit site healed and incorporated, the velour is fully implanted at exit site. Small amount of yellow/brown drainage on gauze with no tenderness in and around drive line. No redness, tenderness, foul odor or rash noted. Drive line anchor re-applied. Pt denies fever or chills. Pt instructed to change dressing every other day. Provided with silver strips; pt says he has adequate supplies at home.    Device:  N/A  BP & Labs:  Doppler 118 - is reflecting Modified systolic  Hgb 14.2 - No S/S of bleeding. Specifically denies melena/BRBPR or nosebleeds.  LDH 377 - and is within established baseline of 200 - 400. Denies tea-colored urine. No power elevations noted on interrogation.   Patient Instructions:  1. Will call  you when labs are back with medication plan per Dr. Gala Romney.  2. Advance dressing changes to every other day. 3. Leota Sauers, PharmD will contact you with INR results and coumadin dosing. 4. Return to VAD clinic in one week.  [name] RN VAD Coordinator  Office: [phone]  24/7 Pager: [phone]

## 2018-01-31 NOTE — Telephone Encounter (Signed)
Called pt per Dr. Gala Romney. Based on creat today of 1.59, pt instructed to increase Entresto to 46/51 mg twice daily. Rx sent to local pharmacy, pt will pick up new Rx and start new dose. Pt verbalized understanding of same.   [name] RN, VAD Coordinator [phone]

## 2018-01-31 NOTE — Progress Notes (Signed)
LVAD Clinic Note   Primary Cardiologist: Dr. Gala RomneyBensimhon   HPI: Benjamin HorsemanSteven Sherman is a 48 y.o. 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 ECHO showed severely reduced EF. 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 Placed on steroids without benefit.  Admitted 11/18 after fall/syncopal episode resulting in bifrontal SAH. Neurosurgery consulted.   Admitted 4/19 with cardiogenic shock in setting of AFL. Supported with inotropes and underwent DC-CV but had persistent shock. After numerous discussions about concern for adequate social support underwent placement of HM-3 LVAD on 05/15/17. D/c home 06/04/17  Today he returns for VAD follow up. At last visit started on Keflex for driveline drainage after dropping controller. Drainage has resolved. No f/c. Feeling good. Remains active with walking. Drinking more fluids. Not taking lasix. Denies orthopnea or PND. No fevers, chills or problems with driveline. No bleeding, melena or neuro symptoms. No VAD alarms. Taking all meds as prescribed.     VAD Indication: Destination Therapy - due to limited social support. [facility] prior to LVAD implant.  LVAD assessment:                                                    Speed: 5300 RPM                                                      Speed:  5300 PRM Flow: 4.0                                                                     Flow:  3.3 Power: 4.1 w                                                   Power:  4.1 PI: 6.9                                                                          PI:  7.2 Alarms: none Events: 56 today; > 60 on 01/30/18 Hct: 20  Hct:  45   Fixed speed: 5300 Low speed limit: 5000  Primary Controller: Replace  back up battery in25 months. Back up controller: Replace back up battery in 14months.   Review of systems complete and found to be negative unless listed in HPI.   Past Medical History:  Diagnosis Date  . CHF (congestive heart failure) (HCC)   . Diabetes mellitus without complication (HCC)     Current Outpatient Medications  Medication Sig Dispense Refill  . allopurinol (ZYLOPRIM) 100 MG tablet Take 100 mg by mouth daily.    Marland Kitchen amiodarone (PACERONE) 200 MG tablet Take 1 tablet (200 mg total) by mouth daily. 30 tablet 6  . amLODipine (NORVASC) 5 MG tablet Take 1 tablet (5 mg total) by mouth daily. 180 tablet 3  . aspirin 81 MG EC tablet Take 1 tablet (81 mg total) by mouth daily. 30 tablet 6  . cephALEXin (KEFLEX) 250 MG capsule Take 1 capsule (250 mg total) by mouth 3 (three) times daily for 7 days. 21 capsule 0  . ferrous sulfate 325 (65 FE) MG EC tablet Take 325 mg by mouth 2 (two) times daily.    Marland Kitchen gabapentin (NEURONTIN) 300 MG capsule Take 1 capsule (300 mg total) by mouth daily. 90 capsule 0  . pantoprazole (PROTONIX) 40 MG tablet Take 40 mg by mouth daily.    Marland Kitchen warfarin (COUMADIN) 6 MG tablet Take 1/2 tablet (3 mg) daily except 1 tablet (6 mg) on Monday and Friday    . enoxaparin (LOVENOX) 40 MG/0.4ML injection Inject 40 mg into the skin every 12 (twelve) hours.    . sacubitril-valsartan (ENTRESTO) 49-51 MG Take 1 tablet by mouth 2 (two) times daily. 60 tablet 60   No current facility-administered medications for this encounter.    Allergies  Allergen Reactions  . Bee Venom     UNSPECIFIED REACTION    Social History   Socioeconomic History  . Marital status: Single    Spouse name: Not on file  . Number of children: Not on file  . Years of education: Not on file  . Highest education level: Not on file  Occupational History  . Occupation: Diplomatic Services operational officer Needs  . Financial resource strain: Not on file  . Food insecurity:    Worry: Not on file    Inability: Not  on file  . Transportation needs:    Medical: Not on file    Non-medical: Not on file  Tobacco Use  . Smoking status: Former Smoker    Types: Cigars    Last attempt to quit: 11/16/2016    Years since quitting: 1.2  . Smokeless tobacco: Never Used  . Tobacco comment: 6 cigars per wk  Substance and Sexual Activity  . Alcohol use: Yes    Alcohol/week: 14.0 standard drinks    Types: 12 Cans of beer, 2 Shots of liquor per week    Comment: occ  . Drug use: No  . Sexual activity: Not on file  Lifestyle  . Physical activity:    Days per week: Not on file    Minutes per session: Not on file  . Stress: Not on file  Relationships  . Social connections:    Talks on phone: Not on file    Gets together: Not on file    Attends religious service: Not on file    Active member of club or organization: Not on file    Attends meetings of clubs or organizations: Not on file  Relationship status: Not on file  . Intimate partner violence:    Fear of current or ex partner: Not on file    Emotionally abused: Not on file    Physically abused: Not on file    Forced sexual activity: Not on file  Other Topics Concern  . Not on file  Social History Narrative  . Not on file    Family History  Problem Relation Age of Onset  . CAD Father    Vitals:   01/31/18 0917 01/31/18 0918  BP: (!) 118/0 121/63  Pulse:  97  SpO2:  100%  Weight:  112.4 kg (247 lb 12.8 oz)  Height:  6' (1.829 m)    Wt Readings from Last 3 Encounters:  01/31/18 112.4 kg (247 lb 12.8 oz)  01/25/18 112.4 kg (247 lb 12.8 oz)  12/25/17 110.6 kg (243 lb 12.8 oz)    Vital Signs:  Doppler Pressure: 104 Automatc BP: 103/61 (79)  HR: 82 SPO2: 98%  Weight: 247 lbs Last weight: 244 lbs  Physical Exam: General:  NAD.  HEENT: normal  Neck: supple. JVP not elevated.  Carotids 2+ bilat; no bruits. No lymphadenopathy or thryomegaly appreciated. Cor: LVAD hum.  Lungs: Clear. Abdomen: obese soft, nontender, non-distended.  No hepatosplenomegaly. No bruits or masses. Good bowel sounds. Driveline site clean. Anchor in place.  Extremities: no cyanosis, clubbing, rash. Warm no edema  Neuro: alert & oriented x 3. No focal deficits. Moves all 4 without problem    ASSESSMENT & PLAN:  1. Chronic Combined Systolic/Diastolic Heart Failure. NICM  - ECHO 4/19 EF 20% CMRI EF 22%  - s/p HM-3 LVAD on 05/15/17 - Doing very well with VAD support  NYHA I. Volume status stable but still having frequent PI events - BP high - Increase Entresto to 49/51 mg BID  - Creatinine stable at 1.59  2. VAD management Driveline with serous exudate expressed. CX exudate, check CBC, and start keflex 250 mg three times a day for 7 days.  - VAD interrogated personally. Still with very frequent PI events unsure if it is a volume issue or due to high afterload. (or both) - Will increase Entresto. Continue to push fluids.  - If PI events continue can consider stopping entresto and switching back to losartan - LDH 377. INR 2.6 Discussed dosing with PharmD personally. - Continue ASA 81 mg daily + coumadin. INR goal 2-2.5. - Driveline infection resolved with Keflex. Reinforced need to avoid driveline trauma  3. AFL - Regular on exam.  - Continue amio 200 mg daily for now.   4. HTN -See above  5. Hypothyroidism - TSH mildly elevated 06/2017 at 5.4, but T3 and T4 normal.    6. DMII - Per PCP  7. CKD 3 - stable creatinine at 1.59  Total time spent 35 minutes. Over half that time spent discussing above.    [name], MD 01/31/18

## 2018-02-01 NOTE — Telephone Encounter (Signed)
Opened in error

## 2018-02-05 ENCOUNTER — Telehealth (HOSPITAL_COMMUNITY): Payer: Self-pay | Admitting: Licensed Clinical Social Worker

## 2018-02-05 NOTE — Telephone Encounter (Signed)
CSW received notification from [employer] that pt should go through alternate funding source for assistance but they would provide 3 month supply to patient while they worked on alternate assistance.  CSW informed pt and provided him with number for [employer] and for [facility] Medicaid assistance line   CSW will continue to follow and assist as needed   Burna Sis, LCSW Clinical Social Worker [facility] [phone]

## 2018-02-06 ENCOUNTER — Other Ambulatory Visit (HOSPITAL_COMMUNITY): Payer: Self-pay | Admitting: *Deleted

## 2018-02-06 DIAGNOSIS — Z95811 Presence of heart assist device: Secondary | ICD-10-CM

## 2018-02-06 DIAGNOSIS — I10 Essential (primary) hypertension: Secondary | ICD-10-CM

## 2018-02-06 DIAGNOSIS — Z7901 Long term (current) use of anticoagulants: Secondary | ICD-10-CM

## 2018-02-07 ENCOUNTER — Ambulatory Visit (HOSPITAL_COMMUNITY): Payer: Self-pay | Admitting: Pharmacist

## 2018-02-07 ENCOUNTER — Ambulatory Visit (HOSPITAL_COMMUNITY)
Admission: RE | Admit: 2018-02-07 | Discharge: 2018-02-07 | Disposition: A | Discharge status: Home / Self Care | Payer: Medicaid Other | Source: Ambulatory Visit | Attending: Cardiology | Admitting: Cardiology

## 2018-02-07 ENCOUNTER — Encounter (HOSPITAL_COMMUNITY): Payer: Self-pay

## 2018-02-07 VITALS — BP 114/71 | HR 85 | Wt 245.2 lb

## 2018-02-07 DIAGNOSIS — Z95811 Presence of heart assist device: Secondary | ICD-10-CM | POA: Insufficient documentation

## 2018-02-07 DIAGNOSIS — Z7901 Long term (current) use of anticoagulants: Secondary | ICD-10-CM | POA: Insufficient documentation

## 2018-02-07 DIAGNOSIS — I5022 Chronic systolic (congestive) heart failure: Secondary | ICD-10-CM

## 2018-02-07 DIAGNOSIS — I480 Paroxysmal atrial fibrillation: Secondary | ICD-10-CM

## 2018-02-07 DIAGNOSIS — I10 Essential (primary) hypertension: Secondary | ICD-10-CM

## 2018-02-07 LAB — CBC
HCT: 46.5 % (ref 39.0–52.0)
Hemoglobin: 14.7 g/dL (ref 13.0–17.0)
MCH: 29.5 pg (ref 26.0–34.0)
MCHC: 31.6 g/dL (ref 30.0–36.0)
MCV: 93.4 fL (ref 80.0–100.0)
Platelets: 225 10*3/uL (ref 150–400)
RBC: 4.98 MIL/uL (ref 4.22–5.81)
RDW: 14.5 % (ref 11.5–15.5)
WBC: 6.4 10*3/uL (ref 4.0–10.5)
nRBC: 0 % (ref 0.0–0.2)

## 2018-02-07 LAB — LACTATE DEHYDROGENASE: LDH: 315 U/L — ABNORMAL HIGH (ref 98–192)

## 2018-02-07 LAB — BASIC METABOLIC PANEL
Anion gap: 8 (ref 5–15)
BUN: 16 mg/dL (ref 6–20)
CO2: 27 mmol/L (ref 22–32)
Calcium: 9.2 mg/dL (ref 8.9–10.3)
Chloride: 103 mmol/L (ref 98–111)
Creatinine, Ser: 1.62 mg/dL — ABNORMAL HIGH (ref 0.61–1.24)
GFR calc Af Amer: 57 mL/min — ABNORMAL LOW (ref >60)
GFR calc non Af Amer: 49 mL/min — ABNORMAL LOW (ref >60)
Glucose, Bld: 139 mg/dL — ABNORMAL HIGH (ref 70–99)
Potassium: 4.7 mmol/L (ref 3.5–5.1)
Sodium: 138 mmol/L (ref 135–145)

## 2018-02-07 LAB — PROTIME-INR
INR: 2.34
Prothrombin Time: 25.3 seconds — ABNORMAL HIGH (ref 11.4–15.2)

## 2018-02-07 NOTE — Progress Notes (Signed)
Patient reports he has not heard yet from [facility] in [place]. He also reports that he got a denial from [employer] for his entresto medication. CSW unable to obtain needed info during visit regarding status of [employer] referral for insurance options so will follow up with patient next week. Patient agreeable and will return call to CSW if further information received at home. CSW continues to follow. Lasandra Beech, LCSW, CCSW-MCS [phone]

## 2018-02-07 NOTE — Patient Instructions (Addendum)
1. Continue Coumadin 6mg  (I tablet) Monday & Friday, 3 mg (1/2 tablet) all other days. 2. Continue every other day dressing changes.  3. Return to clinic in 1 week.

## 2018-02-07 NOTE — Progress Notes (Signed)
Patient presents for 1 week follow up in VAD Clinic today. Reports no problems with VAD equipment or concerns with drive line.   Pt states that he has been walking 4 days a week for 30-45 mins at the mall in [place]; wt today down 2 lbs from previous visit.   PI elevated on interrogation. MAP improved from last visit. States that he has increased his fluid intake to 2L/day, except yesterday.   Denies further drive line trauma. Gene has been changing his DL dressing every 2-3 days. Last change was on Monday. Gene has been unable to change daily due to his busy work schedule. Instructed him to let VAD coordinators know if Gene is unable to change dressings so we can assist if needed. He verbalized understanding.  Dressing changed today - see below.   Vital Signs:        Doppler Pressure: 120 Automatc BP: 114/71 (96)    HR: 85      SPO2: 96% on RA       Weight: 245.2 lb w/o eqt  Last weight:247.8 lb  VAD Indication: Destination Therapy - due to limited social support. Pt discussed with Dr. Edwena Blow at [facility] prior to LVAD implant.  LVAD assessment:      Speed: 5300 RPM      Flow: 4.1       Power: 4.1 w        PI: 6.5        Alarms: none Events: 25-40 daily Hct: 20       Fixed speed: 5300 Low speed limit: 5000  Primary Controller: Replace back up battery in 25 months. Back up controller: Replace back up battery in 22months.  I reviewed the LVAD parameters from today and compared the results to the patient's prior recorded data. LVAD interrogation was NEGATIVE for significant power changes, NEGATIVE for clinical alarms and STABLE for PI events/speed drops. No programming changes were made and pump is functioning within specified parameters. Pt is performing daily controller and system monitor self tests along with completing weekly and monthly maintenance for LVAD equipment.  LVAD equipment check completed and is in good working order. Back-up equipment present.   Exit Site  Care: Last dressing change per Gene was done on Monday. Existing VAD dressing removed and site care performed using sterile technique. Drive line exit site cleaned with Chlora prep applicators x 2, allowed to dry, and gauze dressing with silver strip re-applied. Exit site healed and incorporated, the velour is fully implanted at exit site. Small amount of yellow/brown drainage on gauze with no tenderness in and around drive line. No redness, tenderness, foul odor or rash noted. Drive line anchor re-applied. Pt denies fever or chills. Pt instructed to change dressing every other day. Patient says he has adequate supplies at home.          Device:  N/A  BP & Labs:  Doppler 120 - is reflecting Modified systolic  Hgb 14.7 - No S/S of bleeding. Specifically denies melena/BRBPR or nosebleeds.  LDH 315- and is within established baseline of 200 - 400. Denies tea-colored urine. No power elevations noted on interrogation.   Patient Instructions:  1. No medication changes. Will increase Entresto next week if MAP remains elevated.  2. Continue Coumadin 6mg  Mon/Fri, 3mg  all other days 2. Return to clinic in 1 week.    Alyce Pagan RN VAD Coordinator  Office: [phone]  24/7 Pager: [phone]

## 2018-02-11 NOTE — Progress Notes (Signed)
LVAD Clinic Note   Primary Cardiologist: Dr. Gala Romney   HPI: Benjamin Sherman is a 48 y.o. 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 ECHO showed severely reduced EF. 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 Placed on steroids without benefit.  Admitted 11/18 after fall/syncopal episode resulting in bifrontal SAH. Neurosurgery consulted.   Admitted 4/19 with cardiogenic shock in setting of AFL. Supported with inotropes and underwent DC-CV but had persistent shock. After numerous discussions about concern for adequate social support underwent placement of HM-3 LVAD on 05/15/17. D/c home 06/04/17  Today he returns for VAD follow up. Continues to do very well. Going to mall in [place] and walking 30-45 mins 3-4x/week. No SOB or CP. At last visit increased Entresto to 49/51 bid for HTN,   Denies orthopnea or PND. No fevers, chills or problems with driveline. No bleeding, melena or neuro symptoms. No VAD alarms. Taking all meds as prescribed.    VAD Indication: Destination Therapy - due to limited social support. Pt discussed with Dr. Edwena Blow at [facility] prior to LVAD implant.  LVAD assessment:                                                    Speed: 5300 RPM                                                       Flow: 4.1                                                                      Power: 4.1 w                                        PI: 6.5                                                                           Alarms: none Events: 25-40 daily Hct: 20                                                              Fixed speed: 5300 Low speed limit: 5000  Primary Controller: Replace back up battery in25 months. Back up controller: Replace back up battery in  39monKatriGrStarr County Memorial HospitWDigestive Disease Center Of Central [place] LLOswaldo DOrLivia SNexus Specialty Ho161*096*0TonDarl PikVa Black Hills Healthcare System - Fort Meadees1Judeth CorChi Health ImmanuAnselmo PiLadona RGeoffery SanChales Abra16Shana ChutSutter Maternity And Surgery Center Of SaReWilliam Jennings Bryan Dorn Va Medical CenCorRenard HaUJFredderick PhenPara MaEarlene PlElectSurgery Center Of Key West LLC SnGAurCherrie GauzHill Crest Behavioral Health ServicesVivien Roda ShuttersRDorothyannReJames H. Quillen Va MedicaKennedy Kreiger InstituteConsuello BosHemphill County HospiCarolina Surgery Center LLC Dba The SSurgical Licensed Ward Partners LLP Dba Underwood Surgery CenteM7aSpecialty Hospital At MonmouthCory <MEASUREMEBjorn Loser629Regional WeSRenae GlossTeral0Shirlee LBon Secours Depaul Medical Cente55ratcLowell Parkland Health Center-Bonne TerreAlycia [id number] PasCharolSampsMoraSacred Oak Medical CenteSt Vincent'S Medical CenterLaSurgePenneloClarBrayton Caves Al Joni ReiningLadona MartonJuaDClenEinar PheasaBapCape Cod HospReed PanFleUnited ReTSheryle Ambulatory Surgical Pavilion At Robert Wood Johnson LLCaiSilverio Lay(5Trey Paula0GArlyss RePermian ReGladstoSwazilaRedgDaphineGeronOlegario MessierAndRedgeJPearl River CounMaLenarProvidence SurgeryDepartment Of ClaiboRedge GaiNewman NDespina Hiddenc1Poole Endoscopy CeLyla Alinda MoneyuciaWJ19Avera Saint Lukes HospitFairfield Surgery Center LLRoCharles George Va Medical CentermeoFair PLeta JunglEdmond -Amg Specialty HospitalingRHyaciNeuro Behavioral HospitalthSFelicitHerndon SurgeTeEncompass Health Rehabilitation Of City VieLondon Laser And OutpatieRobynn Pane SHyacinthLGOrlie PollHerbert SetVerNG29aGer82956ynn RSerafiDelila ChalesMilus B4Johns Hopkins Bayview Medical CentCentral TKKaBrita RoPine Grove Ambulatory Surgica89lSherian MaroonRaybBethlehem CustomSouth County Surgical CenteCicero DuckaUpmcMercy MeGeMyrtiMerrilee Terrilee CrArPBaraga County Memorial Hos87montKatriGrHima San Pablo CupWSoma Surgery CenteOswaldo DOrLivia SLifec161*096*0TonDarl PikSurgicare Of Central Florida Ltdes1Judeth CorAcuity Specialty Hospital Of New JersAnselmo PiLadona RGeoffery St ChalRedgeFayette County MemMaRehabilitation Institute Of MichiCorRenard HaUJFredderick PhenPara MaEarlene PlElectCorpus Christi Endoscopy Center LLP SnGAurCherrie GauzLouisville Surgery CenterVivien Roda ShuttersRDorothyannOhioheHutchinson Area HeaAtrium Health UniversityCoAllied SerPaso Del Norte Surgery CenteM5aEast Ohio Regional HospitalCory <MEASUREMEBjorn Loser629Lifecare Specialty HoSZ61CarVibra Hospital Of Springfield, LLCPhoenixvilDolRenae GlossTeral0Shirlee LFairview Southdale Hospita70latcLowell John Muir Medical Center-Concord CampusAlycia [id number] Regional HeaNicanoPriMaManson PasCharolSampsMora BSt Lukes Surgical Center InGrant Reg Hlth PenneloClarBrayton Caves Al Joni ReiningLadona MartDCleCampbell Clinic Surgery CenterReed PanFleDTSherylePoplar Bluff Regional Medical Center - SouthHSilverio Layi2Trey Paula9GArlyss ReRichard L. RoudebGladstoSwazilaRedgDaphineGeronOlegario MessierAndRedgeJNew Orleans La Uptown West Bank EndoscMaLenaNorthwest Specialty HClaiboRedge GaiNewman NDespina Hiddenc1Indiana University Health Blackford Lyla LAlinda Moneycia WJ19Richland Memorial HospitMemorial Medical Center - AshlanRoSeneca Healthcare DistrictmeoSt Cloud Center FLeta JunglOwensboro Health Muhlenberg Community HospitalingRHyacinthClement [facility]. ZFelicity Pellegr1AdveNNea Baptist Memorial HealtLondon MarinRobynn PaneenHyacinthLGOrlie PollHerbert SetVerNG29aGer82956ynn RSerafiDelila ChalesMilus B4Four Winds Hospital WestchestMedinasummit KKaBrita RoNorthridge Outpatient Surgery Center In22cSherian MaroonRaCustomNorNorth Platte Surgery Center LLCicero Duck Baptist Medical CGeMyrtiMerrilee Terrilee CrArPClermont Ambulatory Surgical CenteKatrinka BlWillis-Knighton South & Cente9629Tonye16124Ladona RidgelReDoneChales Abra16Shana ChutBrigham City Community HosLadona RidgelAbbSerb7829Spectru454UJFredderick PhenPara MaEarlene PlElecta SnGracielAurther LoZORoda ShuttersWDorothyann SPatt0Shirlee LatcLowell Guita11762-040-78EffiNicanor AlcoCharolSampsMora Bellmano16Al CorLadona RidJuanetta Gosli09811JuUniversity Of Colorado Health At MeReed PanFleInstitute Of OrSheryle HaiSilverio Lay(254GaPaulGladstoSwazilaRedgDaphine Deutsc1Lenar1476Newman Nic13054WJ19Bloomington Surgery CentCrown Point Surgery CenteRomeoRHyacinth MeeFelicity Pellegr16116Hyacinth Meek161NG299Geralynn RSerafiDelila Chales Abra409JusWySurgery Center Of Chesapeake LLGeralynn RSerafinChales57montKatriGrKern Medical Surgery Center LWSurgery Center At Liberty Hospital LLOswaldo DOrLivia SP161*096*0TonDarl PikJennings Senior Care Hospitales1Judeth CorJohns Hopkins Surgery Centers Series Dba Knoll North Surgery CentAnselmo PiLadona RGeoffery Lhz Ltd Dba SChalesRedgeAdvanced Outpatient Surgery OAleSj East Campus LLC Asc Dba Denver Surgery CenCorRenard HaUJFredderick PhenPara MaEarlene PlElectSimi Surgery Center Inc SnGAurCherrie GauzHiLLCrest Hospital HenryettaVivien Roda ShuttersRDorothyannSt Lucys OutpatienFranklin Surgical CeLakeview Behavioral Health SystemConsuello BProvFair Park Surgery CenteM60aGifford Medical CenterCory <MEASUREMEBjorn Loser629Cadence Renae GlossTeral0Shirlee LLifebright Community Hospital Of Earl70yatcLowell Kindred Hospital LimaAlycia [phone] PasCharolSampsMora Willow Creek Behavioral HealtSan Antonio Gastroenterology Endoscopy Center NorthLaLecom HPenneloClarBrayton Caves Al Joni ReiningLadona MartonJuanetta Advanced Surgery CeDClenEinar PheasaChildren'S InsSanford Health Detroit Lakes Same Day SurgeryReed PTSheryleOsf Saint Luke Medical CenterHSilverio Layi3Trey Paula6GArlyss ReKGladstoSwazilaRedgDaphineGeronOlegario MessierAndRedgeJNorth Texas StaMaLenarFresno Va Medical Center (Va Central CaliforniClaiboRedge GaiNewman NDespina Hiddenc1Uhs Binghamton General LylaAlinda MoneyLuciWJ19Northeast Regional Medical CentWomack Army Medical CenteRoUrology Surgery Center LPmeoTristar GrLeta JunglAthens Surgery Center LtdingRHyacinthOAssociated Surgical Center LLChsnFelicity Pellegr1MarylaShriners HoJaTerChoctaWashington Outpatient Surgery Center LLLondon SumRobynn Panet HyacinthLGOrlie PollHerbert SetVerNG29aGer82956ynn RSerafiDelila ChalesMilus B4Rogers City Rehabilitation HospitEastland MediKKaBrita RoHealthsouth Rehabilitation Hospital Of Forth Wort41hSherian MaroonRaybPCustoAnson General HospitaCicero DuckaMerit GeMyrtiMerrilee Terrilee CrArPHosp Pavia De Hato ReKatrinka BlMc9629Tonye16124Ladona RidgelReDoneChales Abra16Shana ChutTouchette Regional HosPatt096811579-607-85Effie SCharolSampsMora Bellmano16Al Corpu09811JuUniversiReed PanFleMccurtSheryle Hai(705GaPauliCigna OutpGladstoSwazilaRedgDaphine Deutsc1Lenar1476Newman Nic13WJ19Childrens Recovery Center Of Northern CalifornBailey Square Ambulatory Surgical Center LtRomeoOur Lady Of Fatima Hospital Lolly MustacheMount Sinai Rehabilitation HospRHyacinth MeeFelicity Pellegr16116Hyacinth Meek161NG299Geralynn RSerafiDelila Chales Abra409Korea16BunkCustomer1614098GeMyrtis SeTerrilee CrArdyth HarpsPitternageruderderick PhenPara MaEarlene PlElecta SnGracielAurther LoZMidwe5409Me AG>ed data.LVAD interrogation was NEGATIVEfor significant power changes, NEGATIVEfor clinicalalarms and STABLEfor PI events/speed drops. No programming changes were madeand pump is functioning within specified parameters. Pt is performing daily controller and system monitor self tests along with completing weekly and monthly maintenance for LVAD equipment.  LVAD equipment check completed and is in good working order. Back-up equipment present.    Review of systems complete and found to be negative unless listed in HPI.   Past Medical History:  Diagnosis Date  . CHF (congestive heart failure) (HCC)   . Diabetes mellitus without complication (HCC)     Current Outpatient Medications  Medication Sig Dispense Refill  . allopurinol (ZYLOPRIM) 100 MG tablet Take 100 mg by mouth daily.    . amiodarone (PACERONE) 200 MG tablet Take 1 tablet (200 mg total) by mouth daily. 30 tablet 6  . amLODipine (NORVASC) 5 MG tablet Take 1 tablet (5 mg total) by mouth daily. 180 tablet 3  . aspirin 81 MG EC tablet Take 1 tablet (81 mg total) by mouth daily. 30 tablet 6  . ferrous sulfate 325 (65 FE) MG EC tablet Take 325 mg by mouth 2 (two) times daily.    . gabapentin (NEURONTIN) 300 MG capsule Take 1 capsule (300 mg total) by mouth daily. 90 capsule 0  . pantoprazole (PROTONIX) 40 MG tablet Take 40 mg by mouth daily.    . sacubitril-valsartan (ENTRESTO) 49-51 MG Take 1 tablet by mouth 2 (two) times daily. 60 tablet 60  . warfarin (COUMADIN) 6 MG tablet Take 1/2 tablet (3 mg) daily except 1 tablet (6 mg) on Monday and Friday    . enoxaparin (LOVENOX) 40 MG/0.4ML injection Inject 40 mg into the skin every 12 (twelve) hours.     No current facility-administered medications for this encounter.    Allergies  Allergen Reactions  . Bee Venom     UNSPECIFIED REACTION    Social History   Socioeconomic History  . Marital status: Single    Spouse  name: Not on file  . Number of children: Not on file  . Years of education: Not on file  . Highest education level: Not on file  Occupational History  . Occupation: security  Social Needs  . Financial resource strain: Not on file  . Food insecurity:    Worry: Not on file    Inability: Not on file  . Transportation needs:    Medical: Not on file    Non-medical: Not on file  Tobacco Use  . Smoking status: Former Smoker    Types: Cigars    Last attempt to quit: 11/16/2016    Years since quitting: 1.2  . Smokeless tobacco: Never Used  . Tobacco comment: 6 cigars per wk  Substance and Sexual Activity  . Alcohol use: Yes    Alcohol/week: 14.0 standard drinks    Types: 12 Cans of beer, 2 Shots of liquor per week    Comment: occ  . Drug use: No  . Sexual activity: Not on file  Lifestyle  . Physical activity:    Days per week: Not on file    Minutes per session: Not on file  . Stress: Not on file  Relationships  . Social connections:    Talks on phone: Not on file    Gets together: Not on file    Attends religious service: Not on file    Active  member of club or organization: Not on file    Attends meetings of clubs or organizations: Not on file    Relationship status: Not on file  . Intimate partner violence:    Fear of current or ex partner: Not on file    Emotionally abused: Not on file    Physically abused: Not on file    Forced sexual activity: Not on file  Other Topics Concern  . Not on file  Social History Narrative  . Not on file    Family History  Problem Relation Age of Onset  . CAD Father    Vitals:   02/07/18 1402 02/07/18 1403  BP: (!) 120/0 114/71  Pulse: 85   SpO2: 96%   Weight: 111.2 kg (245 lb 3.2 oz)     Wt Readings from Last 3 Encounters:  02/07/18 111.2 kg (245 lb 3.2 oz)  01/31/18 112.4 kg (247 lb 12.8 oz)  01/25/18 112.4 kg (247 lb 12.8 oz)     Vital Signs:                                                                 Doppler  Pressure:120 Automatc BP: 114/71 (96)                               HR: 85                                                  SPO2: 96% on RA                                                        Weight: 245.2 lb w/o eqt  Last weight:247.8 lb   Physical Exam: General:  NAD.  HEENT: normal  Neck: supple. JVP not elevated.  Carotids 2+ bilat; no bruits. No lymphadenopathy or thryomegaly appreciated. Cor: LVAD hum.  Lungs: Clear. Abdomen: obese soft, nontender, non-distended. No hepatosplenomegaly. No bruits or masses. Good bowel sounds. Driveline site clean. Anchor in place.  Extremities: no cyanosis, clubbing, rash. Warm no edema  Neuro: alert & oriented x 3. No focal deficits. Moves all 4 without problem    ASSESSMENT & PLAN:  1. Chronic Combined Systolic/Diastolic Heart Failure. NICM  - ECHO 4/19 EF 20% CMRI EF 22%  - s/p HM-3 LVAD on 05/15/17 - Doing very well. NYHA I with VAD support.  - BP improved with increase of Entresto. PI events decreased. BP still a bit on the high side but will wait another week before titrating again to make sure renal function stable and volume status ok. - Continue Entresto to 49/51 mg BID  - Creatinine stable at 1.59 -> 1.62  2. VAD management Driveline with serous exudate expressed. CX exudate, check CBC, and start keflex 250 mg three times a day for 7 days.  - VAD interrogated personally. Parameters stable. PI events decreased a bit with  better control of HTN - Continue to encourage po intake   - LDH 315. INR 2.34 Discussed dosing with PharmD personally. - Continue ASA 81 mg daily + coumadin. INR goal 2-2.5. - Driveline infection resolved with recent Keflex course. He is more careful with securing driveline now.   3. AFL - Maintaining NSR - Continue amio 200 mg daily for now.   4. HTN -See above  5. Hypothyroidism - TSH mildly elevated 06/2017 at 5.4, but T3 and T4 normal.    6. DMII - Per PCP  7. CKD 3 - stable creatinine at  1.62  Total time spent 35 minutes. Over half that time spent discussing above.    [name], MD 02/11/18

## 2018-02-11 NOTE — Addendum Note (Signed)
Encounter addended by: Dolores Patty, MD on: 02/11/2018 11:39 PM  Actions taken: Clinical Note Signed, Visit diagnoses modified, LOS modified, Charge Capture section accepted

## 2018-02-14 ENCOUNTER — Other Ambulatory Visit (HOSPITAL_COMMUNITY): Payer: Self-pay | Admitting: Unknown Physician Specialty

## 2018-02-14 DIAGNOSIS — Z7901 Long term (current) use of anticoagulants: Secondary | ICD-10-CM

## 2018-02-14 DIAGNOSIS — Z95811 Presence of heart assist device: Secondary | ICD-10-CM

## 2018-02-14 NOTE — Addendum Note (Signed)
Addended by: [name] B on: 02/14/2018 04:25 PM   Modules accepted: Orders

## 2018-02-15 ENCOUNTER — Ambulatory Visit (HOSPITAL_COMMUNITY): Payer: Self-pay | Admitting: Pharmacist

## 2018-02-15 ENCOUNTER — Ambulatory Visit (HOSPITAL_COMMUNITY)
Admission: RE | Admit: 2018-02-15 | Discharge: 2018-02-15 | Disposition: A | Discharge status: Home / Self Care | Payer: Medicaid Other | Source: Ambulatory Visit | Attending: Cardiology | Admitting: Cardiology

## 2018-02-15 DIAGNOSIS — Z7901 Long term (current) use of anticoagulants: Secondary | ICD-10-CM | POA: Insufficient documentation

## 2018-02-15 DIAGNOSIS — Z95811 Presence of heart assist device: Secondary | ICD-10-CM

## 2018-02-15 LAB — BASIC METABOLIC PANEL
Anion gap: 9 (ref 5–15)
BUN: 22 mg/dL — ABNORMAL HIGH (ref 6–20)
CO2: 25 mmol/L (ref 22–32)
Calcium: 9.3 mg/dL (ref 8.9–10.3)
Chloride: 106 mmol/L (ref 98–111)
Creatinine, Ser: 1.57 mg/dL — ABNORMAL HIGH (ref 0.61–1.24)
GFR calc Af Amer: 59 mL/min — ABNORMAL LOW (ref >60)
GFR calc non Af Amer: 51 mL/min — ABNORMAL LOW (ref >60)
Glucose, Bld: 160 mg/dL — ABNORMAL HIGH (ref 70–99)
Potassium: 4.8 mmol/L (ref 3.5–5.1)
Sodium: 140 mmol/L (ref 135–145)

## 2018-02-15 LAB — PROTIME-INR
INR: 2.43
Prothrombin Time: 26 seconds — ABNORMAL HIGH (ref 11.4–15.2)

## 2018-02-15 NOTE — Progress Notes (Signed)
Patient presents for dressing change and BP check  in VAD Clinic today. Reports no problems with VAD equipment or concerns with drive line.   Denies further drive line trauma. Gene has been changing his DL dressing every 2-3 days. Last change was on Tuesday.   Pt states that he hasn't taken his Entresto for 2 days because he ran out. Pt was instructed to pick it up at the pharmacy. Molly sent him a script at his last visit. Pt verbalized understanding of instructions and said he would pick it up.   Vital Signs:        Doppler Pressure: 120 Automatc BP: 131/95 (110)    HR: 85      SPO2: 96% on RA       Weight: 245.2 lb w/o eqt  Last weight:247.8 lb  VAD Indication: Destination Therapy - due to limited social support. Pt discussed with Dr. Edwena Blow at [facility] prior to LVAD implant.    Exit Site Care: Last dressing change per Gene was done on Tuesday. Existing VAD dressing removed and site care performed using sterile technique. Drive line exit site cleaned with Chlora prep applicators x 2, allowed to dry, and gauze dressing with silver strip re-applied. Exit site healed and incorporated, the velour is fully implanted at exit site. Small amount of clear. yellow drainage on gauze with no tenderness in and around drive line. No redness, tenderness, foul odor or rash noted. Drive line anchor re-applied. Pt denies fever or chills. Pt instructed to change dressing every other day. Patient says he has adequate supplies at home.      Device:  N/A  BP & Labs:  Doppler 120 - is reflecting Modified systolic  Hgb 14.7 - No S/S of bleeding. Specifically denies melena/BRBPR or nosebleeds.  LDH 315- and is within established baseline of 200 - 400. Denies tea-colored urine. No power elevations noted on interrogation.   Patient Instructions:  1. No medication changes due to pt not taking his Entresto. Pt called back to the clinic stating he doesn't have insurance for the [facility]. Belenda Cruise will take a  2 week supply of Entresto 49-51 to the patient this evening.  2. Return to clinic in 1 week.    [name] RN VAD Coordinator  Office: [phone]  24/7 Pager: [phone]

## 2018-02-15 NOTE — Addendum Note (Signed)
Encounter addended by: Lebron Quam, RN on: 02/15/2018 2:31 PM  Actions taken: Clinical Note Signed

## 2018-02-19 ENCOUNTER — Ambulatory Visit: Payer: Self-pay | Attending: Family Medicine | Admitting: Family Medicine

## 2018-02-19 ENCOUNTER — Telehealth (HOSPITAL_COMMUNITY): Payer: Self-pay | Admitting: Licensed Clinical Social Worker

## 2018-02-19 ENCOUNTER — Encounter: Payer: Self-pay | Admitting: Family Medicine

## 2018-02-19 VITALS — BP 116/88 | Temp 98.6°F | Ht 72.0 in | Wt 248.0 lb

## 2018-02-19 DIAGNOSIS — Z6833 Body mass index (BMI) 33.0-33.9, adult: Secondary | ICD-10-CM

## 2018-02-19 DIAGNOSIS — E1165 Type 2 diabetes mellitus with hyperglycemia: Secondary | ICD-10-CM

## 2018-02-19 DIAGNOSIS — Z95811 Presence of heart assist device: Secondary | ICD-10-CM

## 2018-02-19 DIAGNOSIS — R7989 Other specified abnormal findings of blood chemistry: Secondary | ICD-10-CM

## 2018-02-19 DIAGNOSIS — N183 Chronic kidney disease, stage 3 unspecified: Secondary | ICD-10-CM

## 2018-02-19 DIAGNOSIS — E1169 Type 2 diabetes mellitus with other specified complication: Secondary | ICD-10-CM

## 2018-02-19 DIAGNOSIS — I480 Paroxysmal atrial fibrillation: Secondary | ICD-10-CM

## 2018-02-19 DIAGNOSIS — I5022 Chronic systolic (congestive) heart failure: Secondary | ICD-10-CM

## 2018-02-19 LAB — POCT GLYCOSYLATED HEMOGLOBIN (HGB A1C): HbA1c, POC (controlled diabetic range): 7.4 % — AB (ref 0.0–7.0)

## 2018-02-19 LAB — GLUCOSE, POCT (MANUAL RESULT ENTRY): POC Glucose: 114 mg/dl — AB (ref 70–99)

## 2018-02-19 MED ORDER — TRUEPLUS LANCETS 28G MISC: 1.0000 | Freq: Every day | 12 refills | Status: AC

## 2018-02-19 MED ORDER — TRUE METRIX METER DEVI: 1.0000 | Freq: Every day | 0 refills | Status: AC

## 2018-02-19 MED ORDER — GLIPIZIDE 5 MG PO TABS: 2.5000 mg | ORAL_TABLET | Freq: Two times a day (BID) | ORAL | 3 refills | Status: DC

## 2018-02-19 MED ORDER — GLUCOSE BLOOD VI STRP: ORAL_STRIP | 12 refills | Status: AC

## 2018-02-19 MED FILL — glipiZIDE 5 MG TABS: 5 | 30 days supply | Qty: 30 | Fill #0

## 2018-02-19 MED FILL — !TRUE METRIX BLOOD GLUCOSE: 1 days supply | Qty: 1 | Fill #0

## 2018-02-19 MED FILL — TRUEplus LANCETS 28G MISC: 30 days supply | Qty: 30 | Fill #0

## 2018-02-19 MED FILL — TRUE METRIX TEST STRIP: 50 days supply | Qty: 50 | Fill #0

## 2018-02-19 NOTE — Telephone Encounter (Signed)
CSW attempted to reach patient and medicaid office to obtain a confirmation of termination of medicaid benefits for his Entresto  Application. Unable to leave message for patient as voicemail not set up. CSW will continue to call and follow up on Friday if unable to contact. Lasandra Beech, LCSW, CCSW-MCS [phone]

## 2018-02-19 NOTE — Progress Notes (Signed)
Subjective:  Patient ID: Benjamin Sherman, male    DOB: 15-Jul-1970  Age: 48 y.o. MRN: [id number]  CC: Diabetes   HPI  Benjamin Sherman is a 48 with a past medical history of systolic heart failure, paroxysmal AFIB, hypothyroidism, Diabetes Mellitus II, CKD Stage III who presents to clinic today to establish care and for diabetes follow up.     1. Type 2 Diabetes Mellulitis : (Previous HgbA1c 7.7) Patient diagnosed with Type 2 Diabetes Mellitlis 2019. Hgba1c today was 7.4 previously 7.7 no treatment initiated. Denies skins infections wounds, visual changes.  2. CHF: Weight (Last Wt. 245.15 lbs less than 1 day ago) Todays Wt: 248 lbs  Last Recent hospitalizations: Admitted 4/19 with cardiogenic shock. Underwent placement of HM-3 LVAD on 05/15/17.  Last seen Dr. Gala Romney 02/07/2018. Continues to do very well. He goes to mall in [place] and walking 30-45 mins 3-4x/week. No SOB or CP. At his last visit with Dr. Threasa Beards was increased to 49/51 bid for better BP control.  Complaint with medications. He is a non-smoker.  Takes Entresto (sacubitril-valsartan) 49/51 mg.   Denies wt increase, nocturnal dyspnea, sob, edema or activity intolerance.  3. Paroxysmal AFIB: On Amiodarone 200 mg, Coumadin 6 mg (INR monitored by the Coumadin clinic for the last INR of 2.43) denies bruising or bleeding.  4. Hypertension: Complaint with taking medication. Not complaint with dietary approaches to stop hypertension. Denies, chest pain, speech difficulty, or pedel edema.  5. Hypothyroidism: TSH Last checked at [facility]. TSH mildly elevated at 5.4, normal T3 and T4. (subclinical hypothyroidism). Patient denies cold intolerance, wright gain, slowed thinking etc. 6. Healthmaintance : Flu vaccine (refuses) Pneumo vaccine (refuses)  Tetanus (would like)       Past Medical History:  Diagnosis Date  . CHF (congestive heart failure) (HCC)   . Diabetes mellitus without complication [facility])    Past Surgical  History:  Procedure Laterality Date  . CARDIOVERSION N/A 04/27/2017   Procedure: CARDIOVERSION;  Surgeon: Dolores Patty, MD;  Location: [facility] OR;  Service: Cardiovascular;  Laterality: N/A;  . IABP INSERTION N/A 04/25/2017   Procedure: IABP INSERTION;  Surgeon: Dolores Patty, MD;  Location: [facility] INV[facility] CV LAB;  Service: Cardiovascular;  Laterality: N/A;  . IABP INSERTION N/A 05/14/2017   Procedure: IABP INSERTION;  Surgeon: Dolores Patty, MD;  Location: [facility];  Service: Cardiovascular;  Laterality: N/A;  . INSERTION OF IMPLANTABLE LEFT VENTRICULAR ASSIST DEVICE N/A 05/15/2017   Procedure: INSERTION OF IMPLANTABLE LEFT VENTRICULAR ASSIST DEVICE-HM3;  Surgeon: Kerin Perna, MD;  Location: [facility] OR;  Service: Open Heart Surgery;  Laterality: N/A;  HM3  CIRC ARREST  NITRIC OXIDE  . MULTIPLE EXTRACTIONS WITH ALVEOLOPLASTY N/A 05/10/2017   Procedure: Extraction of 2-6, 8-15, 17, 19-24, 27- 30, and 32 with alveoloplasty, and maxillary and mandibular lateral exotoses reductions.;  Surgeon: Charlynne Pander, DDS;  Location: [facility] OR;  Service: Oral Surgery;  Laterality: N/A;  . RIGHT HEART CATH N/A 04/25/2017   Procedure: RIGHT HEART CATH;  Surgeon: Dolores Patty, MD;  [facility]: [facility];  Service: Cardiovascular;  Laterality: N/A;  . RIGHT HEART CATH N/A 05/14/2017   Procedure: RIGHT HEART CATH;  Surgeon: Dolores Patty, MD;  [facility]: [facility];  Service: Cardiovascular;  Laterality: N/A;  . RIGHT/LEFT HEART CATH AND CORONARY ANGIOGRAPHY N/A 11/10/2016   Procedure: RIGHT/LEFT HEART CATH AND CORONARY ANGIOGRAPHY;  Surgeon: Dolores Patty, MD;  [facility]: [facility];  Service: Cardiovascular;  Laterality: N/A;  . TEE WITHOUT CARDIOVERSION N/A 05/15/2017   Procedure: TRANSESOPHAGEAL ECHOCARDIOGRAM (TEE);  Surgeon: Donata Clay, Theron Arista, MD;  Location: [facility] OR;  Service: Open Heart Surgery;  Laterality: N/A;    Allergies  Allergen Reactions  .  Bee Venom     UNSPECIFIED REACTION      Outpatient Medications Prior to Visit  Medication Sig Dispense Refill  . allopurinol (ZYLOPRIM) 100 MG tablet Take 100 mg by mouth daily.    Marland Kitchen amiodarone (PACERONE) 200 MG tablet Take 1 tablet (200 mg total) by mouth daily. 30 tablet 6  . amLODipine (NORVASC) 5 MG tablet Take 1 tablet (5 mg total) by mouth daily. 180 tablet 3  . aspirin 81 MG EC tablet Take 1 tablet (81 mg total) by mouth daily. 30 tablet 6  . enoxaparin (LOVENOX) 40 MG/0.4ML injection Inject 40 mg into the skin every 12 (twelve) hours.    . ferrous sulfate 325 (65 FE) MG EC tablet Take 325 mg by mouth 2 (two) times daily.    Marland Kitchen gabapentin (NEURONTIN) 300 MG capsule Take 1 capsule (300 mg total) by mouth daily. 90 capsule 0  . pantoprazole (PROTONIX) 40 MG tablet Take 40 mg by mouth daily.    . sacubitril-valsartan (ENTRESTO) 49-51 MG Take 1 tablet by mouth 2 (two) times daily. 60 tablet 60  . warfarin (COUMADIN) 6 MG tablet Take 1/2 tablet (3 mg) daily except 1 tablet (6 mg) on Monday and Friday     No facility-administered medications prior to visit.    Past Surgical History:  Procedure Laterality Date  . CARDIOVERSION N/A 04/27/2017   Procedure: CARDIOVERSION;  Surgeon: Dolores Patty, MD;  Location: [facility] OR;  Service: Cardiovascular;  Laterality: N/A;  . IABP INSERTION N/A 04/25/2017   Procedure: IABP INSERTION;  Surgeon: Dolores Patty, MD;  [facility];  Service: Cardiovascular;  Laterality: N/A;  . IABP INSERTION N/A 05/14/2017   Procedure: IABP INSERTION;  Surgeon: Dolores Patty, MD;  [facility];  Service: Cardiovascular;  Laterality: N/A;  . INSERTION OF IMPLANTABLE LEFT VENTRICULAR ASSIST DEVICE N/A 05/15/2017   Procedure: INSERTION OF IMPLANTABLE LEFT VENTRICULAR ASSIST DEVICE-HM3;  Surgeon: Kerin Perna, MD;  Location: [facility] OR;  Service: Open Heart Surgery;  Laterality: N/A;  HM3  CIRC ARREST  NITRIC OXIDE  . MULTIPLE  EXTRACTIONS WITH ALVEOLOPLASTY N/A 05/10/2017   Procedure: Extraction of 2-6, 8-15, 17, 19-24, 27- 30, and 32 with alveoloplasty, and maxillary and mandibular lateral exotoses reductions.;  Surgeon: Charlynne Pander, DDS;  Location: [facility] OR;  Service: Oral Surgery;  Laterality: N/A;  . RIGHT HEART CATH N/A 04/25/2017   Procedure: RIGHT HEART CATH;  Surgeon: Dolores Patty, MD;  [facility];  Service: Cardiovascular;  Laterality: N/A;  . RIGHT HEART CATH N/A 05/14/2017   Procedure: RIGHT HEART CATH;  Surgeon: Dolores Patty, MD;  [facility];  Service: Cardiovascular;  Laterality: N/A;  . RIGHT/LEFT HEART CATH AND CORONARY ANGIOGRAPHY N/A 11/10/2016   Procedure: RIGHT/LEFT HEART CATH AND CORONARY ANGIOGRAPHY;  Surgeon: Dolores Patty, MD;  [facility]: [facility];  Service: Cardiovascular;  Laterality: N/A;  . TEE WITHOUT CARDIOVERSION N/A 05/15/2017   Procedure: TRANSESOPHAGEAL ECHOCARDIOGRAM (TEE);  Surgeon: Donata Clay, Theron Arista, MD;  Location: [facility] OR;  Service: Open Heart Surgery;  Laterality: N/A;    ROS Review of Systems  Constitutional: Negative for activity change, chills, fatigue and unexpected  weight change.  Eyes: Negative for photophobia and visual disturbance.  Respiratory: Negative for cough, shortness of breath and wheezing.   Cardiovascular: Negative for chest pain, palpitations and leg swelling.  Gastrointestinal: Negative for abdominal distention.  Endocrine: Negative for cold intolerance, heat intolerance, polydipsia, polyphagia and polyuria.  Musculoskeletal: Negative for arthralgias.  Skin: Negative for color change, rash and wound.  Neurological: Negative for facial asymmetry, speech difficulty and numbness.  Psychiatric/Behavioral: Negative for agitation.    Objective:  BP 116/88   Temp 98.6 F (37 C)   Ht 6' (1.829 m)   Wt 248 lb (112.5 kg)   BMI 33.63 kg/m   BP/Weight 02/19/2018 02/07/2018 01/31/2018  Systolic BP 116  161114 121  Diastolic BP 88 71 63  Wt. (Lbs) 248 245.2 247.8  BMI 33.63 33.26 33.61       Physical Exam Vitals signs reviewed.  Constitutional:      General: He is not in acute distress.    Appearance: Normal appearance. He is not ill-appearing.  Eyes:     Extraocular Movements: Extraocular movements intact.     Pupils: Pupils are equal, round, and reactive to light.  Cardiovascular:     Pulses: Normal pulses.     Comments: LVAD Hum Pulmonary:     Effort: Pulmonary effort is normal.     Breath sounds: Normal breath sounds. No wheezing or rhonchi.  Abdominal:     General: Bowel sounds are normal.     Palpations: There is no mass.     Tenderness: There is no abdominal tenderness.     Hernia: No hernia is present.  Musculoskeletal:        General: No swelling.     Right lower leg: No edema.     Left lower leg: No edema.  Skin:    General: Skin is warm and dry.     Capillary Refill: Capillary refill takes 2 to 3 seconds.     Findings: No lesion or rash.  Neurological:     General: No focal deficit present.     Mental Status: He is alert and oriented to person, place, and time. Mental status is at baseline.  Psychiatric:        Mood and Affect: Mood normal.        Behavior: Behavior normal.        Thought Content: Thought content normal.        Judgment: Judgment normal.      Assessment & Plan:   1. Type 2 diabetes mellitus with other specified complication, without long-term current use of insulin (HCC) A1c of 7.4; goal of less than 7 Initiate low-dose glipizide; discussed hypoglycemic side effects and he knows to notify the clinic if this occurs We will schedule for clinical pharmacist for teaching - POCT glucose (114) - POCT glycosylated hemoglobin (Hb A1C  7.4 02/19/2018) - Eye examination Referral/ Podiatrist Referral  - glipiZIDE (GLUCOTROL) 5 MG tablet; Take 0.5 tablets (2.5 mg total) by mouth 2 (two) times daily before a meal.  Dispense: 30 tablet; Refill: 3 -  Microalbumin/Creatinine Ratio, Urine - glucose blood (TRUE METRIX BLOOD GLUCOSE TEST) test strip; Use daily before breakfast  Dispense: 30 each; Refill: 12 - Blood Glucose Monitoring Suppl (TRUE METRIX METER) DEVI; 1 each by Does not apply route daily with breakfast.  Dispense: 1 Device; Refill: 0 - TRUEPLUS LANCETS 28G MISC; 1 each by Does not apply route daily.  Dispense: 30 each; Refill: 12  2. Abnormal thyroid blood test Asymptomatic -  Patient is currently taking amiodarone; will check TSH.  - TSH - T4, free - T3, Free  3. CKD (chronic kidney disease), stage III (HCC) - Obtain CMP14 + GFR   4. LVAD (left ventricular assist device) present [facility](HCC) - Last seen Dr.Bensimhon 02/07/2018 for LVAD follow-up.  - Patient is stable at this time, is followed by cardiology.  5. PAF (paroxysmal atrial fibrillation) (HCC) Controlled NSR Currently Continue amiodarone, aspirin, coumadin Plan for pulmonary function test for baseline lung function given he is currently on amiodarone.  He will need the [facility] financial discount to obtain this test given he has no medical coverage and has been educated on the process of application.  6. Chronic systolic CHF (congestive heart failure) (HCC) Euvolemic Last ECHO 4/19 EF 20% s/p LVAD on 05/15/17 Low Sodium Diet Reinforced Continue current medication regimen of Entresto (ARB and neprilysin inhibitor)   Meds ordered this encounter  Medications  . glipiZIDE (GLUCOTROL) 5 MG tablet    Sig: Take 0.5 tablets (2.5 mg total) by mouth 2 (two) times daily before a meal.    Dispense:  30 tablet    Refill:  3  . glucose blood (TRUE METRIX BLOOD GLUCOSE TEST) test strip    Sig: Use daily before breakfast    Dispense:  30 each    Refill:  12  . Blood Glucose Monitoring Suppl (TRUE METRIX METER) DEVI    Sig: 1 each by Does not apply route daily with breakfast.    Dispense:  1 Device    Refill:  0  . TRUEPLUS LANCETS 28G MISC    Sig: 1 each by Does not apply  route daily.    Dispense:  30 each    Refill:  12    Follow-up: Return in about 3 months (around 05/20/2018) for Follow-up of chronic medical conditions.   Hoy RegisterEnobong Keller Mikels MD

## 2018-02-20 ENCOUNTER — Encounter: Payer: Self-pay | Admitting: Family Medicine

## 2018-02-20 ENCOUNTER — Encounter: Payer: Self-pay | Admitting: Pharmacist

## 2018-02-20 ENCOUNTER — Ambulatory Visit: Payer: Self-pay | Attending: Family Medicine | Admitting: Pharmacist

## 2018-02-20 DIAGNOSIS — N183 Chronic kidney disease, stage 3 (moderate): Secondary | ICD-10-CM | POA: Insufficient documentation

## 2018-02-20 DIAGNOSIS — Z7901 Long term (current) use of anticoagulants: Secondary | ICD-10-CM | POA: Insufficient documentation

## 2018-02-20 DIAGNOSIS — Z95811 Presence of heart assist device: Secondary | ICD-10-CM | POA: Insufficient documentation

## 2018-02-20 DIAGNOSIS — I5022 Chronic systolic (congestive) heart failure: Secondary | ICD-10-CM | POA: Insufficient documentation

## 2018-02-20 DIAGNOSIS — E1122 Type 2 diabetes mellitus with diabetic chronic kidney disease: Secondary | ICD-10-CM | POA: Insufficient documentation

## 2018-02-20 DIAGNOSIS — Z794 Long term (current) use of insulin: Secondary | ICD-10-CM | POA: Insufficient documentation

## 2018-02-20 DIAGNOSIS — I480 Paroxysmal atrial fibrillation: Secondary | ICD-10-CM | POA: Insufficient documentation

## 2018-02-20 DIAGNOSIS — R946 Abnormal results of thyroid function studies: Secondary | ICD-10-CM | POA: Insufficient documentation

## 2018-02-20 DIAGNOSIS — Z79899 Other long term (current) drug therapy: Secondary | ICD-10-CM | POA: Insufficient documentation

## 2018-02-20 DIAGNOSIS — I130 Hypertensive heart and chronic kidney disease with heart failure and stage 1 through stage 4 chronic kidney disease, or unspecified chronic kidney disease: Secondary | ICD-10-CM | POA: Insufficient documentation

## 2018-02-20 DIAGNOSIS — Z91030 Bee allergy status: Secondary | ICD-10-CM | POA: Insufficient documentation

## 2018-02-20 DIAGNOSIS — Z7982 Long term (current) use of aspirin: Secondary | ICD-10-CM | POA: Insufficient documentation

## 2018-02-20 LAB — T3, FREE: T3, Free: 2.2 pg/mL (ref 2.0–4.4)

## 2018-02-20 LAB — T4, FREE: Free T4: 1.24 ng/dL (ref 0.82–1.77)

## 2018-02-20 LAB — TSH: TSH: 2.9 u[IU]/mL (ref 0.450–4.500)

## 2018-02-20 NOTE — Progress Notes (Signed)
Patient was educated on the use of the True Metrix blood glucose meter. Reviewed necessary supplies and operation of the meter. Also reviewed goal blood glucose levels. Patient was able to demonstrate use. All questions and concerns were addressed.   

## 2018-02-21 ENCOUNTER — Other Ambulatory Visit (HOSPITAL_COMMUNITY): Payer: Self-pay | Admitting: *Deleted

## 2018-02-21 DIAGNOSIS — Z7901 Long term (current) use of anticoagulants: Secondary | ICD-10-CM

## 2018-02-21 DIAGNOSIS — Z95811 Presence of heart assist device: Secondary | ICD-10-CM

## 2018-02-21 DIAGNOSIS — I5023 Acute on chronic systolic (congestive) heart failure: Secondary | ICD-10-CM

## 2018-02-22 ENCOUNTER — Telehealth: Payer: Self-pay

## 2018-02-22 ENCOUNTER — Ambulatory Visit (HOSPITAL_COMMUNITY)
Admission: RE | Admit: 2018-02-22 | Discharge: 2018-02-22 | Disposition: A | Discharge status: Home / Self Care | Payer: Self-pay | Source: Ambulatory Visit | Attending: Internal Medicine | Admitting: Internal Medicine

## 2018-02-22 ENCOUNTER — Encounter (HOSPITAL_COMMUNITY): Payer: Self-pay

## 2018-02-22 ENCOUNTER — Ambulatory Visit (HOSPITAL_COMMUNITY): Payer: Self-pay | Admitting: Pharmacist

## 2018-02-22 VITALS — BP 96/0 | HR 88 | Ht 72.0 in | Wt 247.2 lb

## 2018-02-22 DIAGNOSIS — Z8249 Family history of ischemic heart disease and other diseases of the circulatory system: Secondary | ICD-10-CM | POA: Insufficient documentation

## 2018-02-22 DIAGNOSIS — I130 Hypertensive heart and chronic kidney disease with heart failure and stage 1 through stage 4 chronic kidney disease, or unspecified chronic kidney disease: Secondary | ICD-10-CM | POA: Insufficient documentation

## 2018-02-22 DIAGNOSIS — I5042 Chronic combined systolic (congestive) and diastolic (congestive) heart failure: Secondary | ICD-10-CM | POA: Insufficient documentation

## 2018-02-22 DIAGNOSIS — Z79899 Other long term (current) drug therapy: Secondary | ICD-10-CM | POA: Insufficient documentation

## 2018-02-22 DIAGNOSIS — E039 Hypothyroidism, unspecified: Secondary | ICD-10-CM | POA: Insufficient documentation

## 2018-02-22 DIAGNOSIS — I5023 Acute on chronic systolic (congestive) heart failure: Secondary | ICD-10-CM

## 2018-02-22 DIAGNOSIS — I428 Other cardiomyopathies: Secondary | ICD-10-CM | POA: Insufficient documentation

## 2018-02-22 DIAGNOSIS — Z7901 Long term (current) use of anticoagulants: Secondary | ICD-10-CM

## 2018-02-22 DIAGNOSIS — I10 Essential (primary) hypertension: Secondary | ICD-10-CM

## 2018-02-22 DIAGNOSIS — N183 Chronic kidney disease, stage 3 (moderate): Secondary | ICD-10-CM | POA: Insufficient documentation

## 2018-02-22 DIAGNOSIS — I480 Paroxysmal atrial fibrillation: Secondary | ICD-10-CM

## 2018-02-22 DIAGNOSIS — Z91030 Bee allergy status: Secondary | ICD-10-CM | POA: Insufficient documentation

## 2018-02-22 DIAGNOSIS — Z7982 Long term (current) use of aspirin: Secondary | ICD-10-CM | POA: Insufficient documentation

## 2018-02-22 DIAGNOSIS — Z95811 Presence of heart assist device: Secondary | ICD-10-CM

## 2018-02-22 DIAGNOSIS — Z87891 Personal history of nicotine dependence: Secondary | ICD-10-CM | POA: Insufficient documentation

## 2018-02-22 DIAGNOSIS — E1122 Type 2 diabetes mellitus with diabetic chronic kidney disease: Secondary | ICD-10-CM | POA: Insufficient documentation

## 2018-02-22 LAB — CBC
HCT: 46.6 % (ref 39.0–52.0)
Hemoglobin: 14.6 g/dL (ref 13.0–17.0)
MCH: 29.5 pg (ref 26.0–34.0)
MCHC: 31.3 g/dL (ref 30.0–36.0)
MCV: 94.1 fL (ref 80.0–100.0)
Platelets: 235 10*3/uL (ref 150–400)
RBC: 4.95 MIL/uL (ref 4.22–5.81)
RDW: 14.6 % (ref 11.5–15.5)
WBC: 6.3 10*3/uL (ref 4.0–10.5)
nRBC: 0 % (ref 0.0–0.2)

## 2018-02-22 LAB — BASIC METABOLIC PANEL
Anion gap: 9 (ref 5–15)
BUN: 19 mg/dL (ref 6–20)
CO2: 24 mmol/L (ref 22–32)
Calcium: 8.9 mg/dL (ref 8.9–10.3)
Chloride: 104 mmol/L (ref 98–111)
Creatinine, Ser: 1.76 mg/dL — ABNORMAL HIGH (ref 0.61–1.24)
GFR calc Af Amer: 51 mL/min — ABNORMAL LOW (ref >60)
GFR calc non Af Amer: 44 mL/min — ABNORMAL LOW (ref >60)
Glucose, Bld: 210 mg/dL — ABNORMAL HIGH (ref 70–99)
Potassium: 5.1 mmol/L (ref 3.5–5.1)
Sodium: 137 mmol/L (ref 135–145)

## 2018-02-22 LAB — LACTATE DEHYDROGENASE: LDH: 344 U/L — ABNORMAL HIGH (ref 98–192)

## 2018-02-22 LAB — PROTIME-INR
INR: 2.27
Prothrombin Time: 24.8 seconds — ABNORMAL HIGH (ref 11.4–15.2)

## 2018-02-22 NOTE — Progress Notes (Signed)
Patient presents for 1 week follow up in V[facility] Clinic today. Reports no problems with VAD equipment or concerns with drive line.   MAP better today, pt says he took his meds this am. He reports he is filling his own pillbox. Pt does not have Norvasc in his pillbox and says he has not been taking this.  Pt states that he has been walking daily for 30 mins at the mall in [place]; wt today stable from last visit.     Vital Signs:        Doppler Pressure: 96 Automatc BP: 109/81 (91)     HR: 88        SPO2: 98% on RA       Weight: 247.2 lb w/o eqt  Last weight:245.2 lb  VAD Indication: Destination Therapy - due to limited social support. Pt discussed with Dr. Edwena Blow at [facility] prior to LVAD implant.  LVAD assessment:      Speed: 5300 RPM      Flow: 4.1       Power: 4.0 w         PI: 5.8        Alarms: none Events: 40-60 daily Hct: 20-tried to increase this today but flow dropped to low 3s        Fixed speed: 5300 Low speed limit: 5000  Primary Controller: Replace back up battery in 24 months. Back up controller: Replace back up battery in 47months.  I reviewed the LVAD parameters from today and compared the results to the patient's prior recorded data. LVAD interrogation was NEGATIVE for significant power changes, NEGATIVE for clinical alarms and STABLE for PI events/speed drops. No programming changes were made and pump is functioning within specified parameters. Pt is performing daily controller and system monitor self tests along with completing weekly and monthly maintenance for LVAD equipment.  LVAD equipment check completed and is in good working order. Back-up equipment present.   Exit Site Care: Gene is doing every other day dressing changes. Existing VAD dressing removed and site care performed using sterile technique. Drive line exit site cleaned with Chlora prep applicators x 2, allowed to dry, and gauze dressing with silver strip re-applied. Exit site healed and  incorporated, the velour is fully implanted at exit site. Small amount of yellow/brown dry drainage on gauze with no tenderness in and around drive line. No redness, tenderness, foul odor or rash noted. Drive line anchor re-applied. Pt denies fever or chills. Pt instructed to change dressing twice a week.     Device:  N/A  BP & Labs:  Doppler 96 - is reflecting MAP  Hgb 14.6 - No S/S of bleeding. Specifically denies melena/BRBPR or nosebleeds.  LDH 344 - and is within established baseline of 200 - 400. Denies tea-colored urine. No power elevations noted on interrogation.   Patient Instructions:  1. No change in medications. 2. Dressing change and Repeat BMET, INR in 2 weeks. 3. Return to clinic in 2 months.   [name] RN VAD Coordinator  Office: [phone]  24/7 Pager: [phone]

## 2018-02-22 NOTE — Telephone Encounter (Signed)
-----   Message from Hoy Register, MD sent at 02/20/2018 12:57 PM EST ----- Labs are normal

## 2018-02-22 NOTE — Telephone Encounter (Signed)
Patient was called and patient has a voicemail that has not been set up yet.

## 2018-02-22 NOTE — Progress Notes (Signed)
CSW met with pt to assist with [employer] assistance program- pt had been denied due to belief he would be eligible for medicaid but pt recently dropped from medicaid because he made too much money.    CSW first called [employer] to request medication be delivered to his home (pt had been given 3 month approval while he looked into medicaid option)- prescription request processed and anticipate delivery of medication on 02/26/18- pt will only get 1 month supply so will need to call for refill when he has about 7-10 supply remaining- pt informed and provided with number for [employer].  CSW then called pt case worker, Melissa Joyce ([phone]) to request notice of medicaid termination as pt does not think he has one at home.  Melissa faxed notice to CSW explaining that pt is not eligible for full medicaid but only for Family Planning Medicaid which would not cover his medications- notice faxed to [employer] for review  CSW will continue to follow through clinic and assist as needed  Jenna H. Uris, LCSW Clinical Social Worker [facility] [phone]   

## 2018-02-23 NOTE — Progress Notes (Signed)
LVAD Clinic Note   Primary Cardiologist: Dr. Gala Romney   HPI: Benjamin Sherman is a 48 y.o. 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 ECHO showed severely reduced EF. 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 Placed on steroids without benefit.  Admitted 11/18 after fall/syncopal episode resulting in bifrontal SAH. Neurosurgery consulted.   Admitted 4/19 with cardiogenic shock in setting of AFL. Supported with inotropes and underwent DC-CV but had persistent shock. After numerous discussions about concern for adequate social support underwent placement of HM-3 LVAD on 05/15/17. D/c home 06/04/17  Today he returns for VAD follow up. Doing very well. Recently we switched him to [facility] for uncontrolled HTN. MAPs improving. He is walking 30-45 minutes every day at the [employer]. Denies SOB. Denies orthopnea or PND. No fevers, chills or problems with driveline. No bleeding, melena or neuro symptoms. No VAD alarms. Taking all meds as prescribed.    VAD Indication: Destination Therapy - due to limited social support. Pt discussed with Dr. Edwena Blow at [facility] prior to LVAD implant.  LVAD assessment:                                                    Speed: 5300 RPM                                                       Flow: 4.1                                                                      Power: 4.0 w                                                    PI: 5.8                                                                           Alarms: none Events: 40-60 daily Hct: 20-tried to increase this today but flow dropped to low 3s                                                               Fixed speed: 5300 Low speed  limit: 5000  Primary Controller: Replace back up battery in24 months. Back up controller:  Replace back up battery in 32months.   I reviewed the LVAD parameters from todayand compared the results to the patient's prior recorded data.LVAD interrogation was NEGATIVEfor significant power changes, NEGATIVEfor clinicalalarms and STABLEfor PI events/speed drops. No programming changes were madeand pump is functioning within specified parameters. Pt is performing daily controller and system monitor self tests along with completing weekly and monthly maintenance for LVAD equipment.  LVAD equipment check completed and is in good working order. Back-up equipment present.    Review of systems complete and found to be negative unless listed in HPI.   Past Medical History:  Diagnosis Date  . CHF (congestive heart failure) (HCC)   . Diabetes mellitus without complication (HCC)     Current Outpatient Medications  Medication Sig Dispense Refill  . amiodarone (PACERONE) 200 MG tablet Take 1 tablet (200 mg total) by mouth daily. 30 tablet 6  . aspirin 81 MG EC tablet Take 1 tablet (81 mg total) by mouth daily. 30 tablet 6  . Blood Glucose Monitoring Suppl (TRUE METRIX METER) DEVI 1 each by Does not apply route daily with breakfast. 1 Device 0  . ferrous sulfate 325 (65 FE) MG EC tablet Take 325 mg by mouth 2 (two) times daily.    Marland Kitchen gabapentin (NEURONTIN) 300 MG capsule Take 1 capsule (300 mg total) by mouth daily. 90 capsule 0  . glipiZIDE (GLUCOTROL) 5 MG tablet Take 0.5 tablets (2.5 mg total) by mouth 2 (two) times daily before a meal. 30 tablet 3  . glucose blood (TRUE METRIX BLOOD GLUCOSE TEST) test strip Use daily before breakfast 30 each 12  . pantoprazole (PROTONIX) 40 MG tablet Take 40 mg by mouth daily.    . sacubitril-valsartan (ENTRESTO) 49-51 MG Take 1 tablet by mouth 2 (two) times daily. 60 tablet 60  . TRUEPLUS LANCETS 28G MISC 1 each by Does not apply route daily. 30 each 12  . warfarin (COUMADIN) 6 MG tablet Take 1/2 tablet (3 mg) daily except 1 tablet (6 mg) on  Monday and Friday    . allopurinol (ZYLOPRIM) 100 MG tablet Take 100 mg by mouth daily.    Marland Kitchen amLODipine (NORVASC) 5 MG tablet Take 1 tablet (5 mg total) by mouth daily. (Patient not taking: Reported on 02/22/2018) 180 tablet 3  . enoxaparin (LOVENOX) 40 MG/0.4ML injection Inject 40 mg into the skin every 12 (twelve) hours.     No current facility-administered medications for this encounter.    Allergies  Allergen Reactions  . Bee Venom     UNSPECIFIED REACTION    Social History   Socioeconomic History  . Marital status: Single    Spouse name: Not on file  . Number of children: Not on file  . Years of education: Not on file  . Highest education level: Not on file  Occupational History  . Occupation: Diplomatic Services operational officer Needs  . Financial resource strain: Not on file  . Food insecurity:    Worry: Not on file    Inability: Not on file  . Transportation needs:    Medical: Not on file    Non-medical: Not on file  Tobacco Use  . Smoking status: Former Smoker    Types: Cigars    Last attempt to quit: 11/16/2016    Years since quitting: 1.2  . Smokeless tobacco: Never Used  . Tobacco comment: 6 cigars per wk  Substance and Sexual Activity  .  Alcohol use: Yes    Alcohol/week: 14.0 standard drinks    Types: 12 Cans of beer, 2 Shots of liquor per week    Comment: occ  . Drug use: No  . Sexual activity: Not on file  Lifestyle  . Physical activity:    Days per week: Not on file    Minutes per session: Not on file  . Stress: Not on file  Relationships  . Social connections:    Talks on phone: Not on file    Gets together: Not on file    Attends religious service: Not on file    Active member of club or organization: Not on file    Attends meetings of clubs or organizations: Not on file    Relationship status: Not on file  . Intimate partner violence:    Fear of current or ex partner: Not on file    Emotionally abused: Not on file    Physically abused: Not on file    Forced  sexual activity: Not on file  Other Topics Concern  . Not on file  Social History Narrative  . Not on file    Family History  Problem Relation Age of Onset  . CAD Father    Vitals:   02/22/18 1030 02/22/18 1031  BP: 109/81 (!) 96/0  Pulse: 88   SpO2: 98%   Weight: 112.1 kg (247 lb 3.2 oz)   Height: 6' (1.829 m)     Wt Readings from Last 3 Encounters:  02/22/18 112.1 kg (247 lb 3.2 oz)  02/19/18 112.5 kg (248 lb)  02/07/18 111.2 kg (245 lb 3.2 oz)      Vital Signs:                                                                 Doppler Pressure:96 Automatc BP: 109/81 (91)                                           HR: 88                                                                          SPO2: 98% on RA                                                        Weight: 247.2 lb w/o eqt  Last weight:245.2 lb   Physical Exam: General:  NAD.  HEENT: normal x endentulous Neck: supple. JVP not elevated.  Carotids 2+ bilat; no bruits. No lymphadenopathy or thryomegaly appreciated. Cor: LVAD hum.  Lungs: Clear. Abdomen: obese soft, nontender, non-distended. No hepatosplenomegaly. No bruits or masses. Good bowel sounds. Driveline site clean. Anchor in place.  Extremities: no cyanosis,  clubbing, rash. Warm no edema  Neuro: alert & oriented x 3. No focal deficits. Moves all 4 without problem   ASSESSMENT & PLAN:  1. Chronic Combined Systolic/Diastolic Heart Failure. NICM  - ECHO 4/19 EF 20% CMRI EF 22%  - s/p HM-3 LVAD on 05/15/17 - Doing very well. NYHA I - BP much improved with Entresto 49/51 bid. Still with frequent PI events on VAD though.  - Volume status looks good off lasix - Creatinine up slightly 1.59 -> 1.62 -> 1.76  Will need to follow closely  2. VAD management Driveline with serous exudate expressed. CX exudate, check CBC, and start keflex 250 mg three times a day for 7 days.  - VAD interrogated personally. Parameters stable. Still with about 40 PI  events daily despite better BP control - LDH 315 - > 344  INR 2.27 Discussed dosing with PharmD personally. - Continue ASA 81 mg daily + coumadin. INR goal 2-2.5. - Driveline infection resolved with recent Keflex course. He is more careful with securing driveline now.   3. AFL - Maintaining NSR - Continue amio 200 mg daily for now.   4. HTN -MAPs much better on Entresto  5. Hypothyroidism - TSH mildly elevated 06/2017 at 5.4, but T3 and T4 normal.    6. DMII - Per PCP  7. CKD 3 -  Creatinine up slightly will follow  Total time spent 35 minutes. Over half that time spent discussing above.    [name], MD 02/23/18

## 2018-03-01 ENCOUNTER — Other Ambulatory Visit (HOSPITAL_COMMUNITY): Payer: Self-pay | Admitting: *Deleted

## 2018-03-01 DIAGNOSIS — Z95811 Presence of heart assist device: Secondary | ICD-10-CM

## 2018-03-01 DIAGNOSIS — Z7901 Long term (current) use of anticoagulants: Secondary | ICD-10-CM

## 2018-03-07 ENCOUNTER — Other Ambulatory Visit (HOSPITAL_COMMUNITY): Payer: Self-pay

## 2018-03-07 ENCOUNTER — Telehealth (HOSPITAL_COMMUNITY): Payer: Self-pay | Admitting: Licensed Clinical Social Worker

## 2018-03-07 NOTE — Telephone Encounter (Signed)
CSW received confirmation that patient's Entresto Patient Assistance application was approved through 02/05/2018. Message left for patient.Lasandra Beech, LCSW, CCSW-MCS [phone]

## 2018-03-08 ENCOUNTER — Other Ambulatory Visit (HOSPITAL_COMMUNITY): Payer: Self-pay

## 2018-03-11 ENCOUNTER — Ambulatory Visit (HOSPITAL_COMMUNITY): Payer: Self-pay | Admitting: Pharmacist

## 2018-03-11 ENCOUNTER — Ambulatory Visit (HOSPITAL_COMMUNITY)
Admission: RE | Admit: 2018-03-11 | Discharge: 2018-03-11 | Disposition: A | Discharge status: Home / Self Care | Payer: Medicaid Other | Source: Ambulatory Visit | Attending: Cardiology | Admitting: Cardiology

## 2018-03-11 DIAGNOSIS — Z95811 Presence of heart assist device: Secondary | ICD-10-CM | POA: Insufficient documentation

## 2018-03-11 DIAGNOSIS — Z7901 Long term (current) use of anticoagulants: Secondary | ICD-10-CM

## 2018-03-11 LAB — PROTIME-INR
INR: 2.8
Prothrombin Time: 28.8 seconds — ABNORMAL HIGH (ref 11.4–15.2)

## 2018-03-11 LAB — BASIC METABOLIC PANEL
Anion gap: 5 (ref 5–15)
BUN: 13 mg/dL (ref 6–20)
CO2: 27 mmol/L (ref 22–32)
Calcium: 9 mg/dL (ref 8.9–10.3)
Chloride: 106 mmol/L (ref 98–111)
Creatinine, Ser: 1.71 mg/dL — ABNORMAL HIGH (ref 0.61–1.24)
GFR calc Af Amer: 53 mL/min — ABNORMAL LOW (ref >60)
GFR calc non Af Amer: 46 mL/min — ABNORMAL LOW (ref >60)
Glucose, Bld: 104 mg/dL — ABNORMAL HIGH (ref 70–99)
Potassium: 5.7 mmol/L — ABNORMAL HIGH (ref 3.5–5.1)
Sodium: 138 mmol/L (ref 135–145)

## 2018-03-18 ENCOUNTER — Other Ambulatory Visit: Payer: Self-pay | Admitting: Internal Medicine

## 2018-03-22 ENCOUNTER — Other Ambulatory Visit (HOSPITAL_COMMUNITY): Payer: Self-pay | Admitting: Unknown Physician Specialty

## 2018-03-22 DIAGNOSIS — Z7901 Long term (current) use of anticoagulants: Secondary | ICD-10-CM

## 2018-03-22 DIAGNOSIS — Z95811 Presence of heart assist device: Secondary | ICD-10-CM

## 2018-03-25 ENCOUNTER — Ambulatory Visit (HOSPITAL_COMMUNITY): Payer: Self-pay | Admitting: Pharmacist

## 2018-03-25 ENCOUNTER — Ambulatory Visit (HOSPITAL_COMMUNITY)
Admission: RE | Admit: 2018-03-25 | Discharge: 2018-03-25 | Disposition: A | Discharge status: Home / Self Care | Payer: Medicaid Other | Source: Ambulatory Visit | Attending: Internal Medicine | Admitting: Internal Medicine

## 2018-03-25 DIAGNOSIS — Z7901 Long term (current) use of anticoagulants: Secondary | ICD-10-CM

## 2018-03-25 DIAGNOSIS — Z95811 Presence of heart assist device: Secondary | ICD-10-CM

## 2018-03-25 LAB — BASIC METABOLIC PANEL
Anion gap: 7 (ref 5–15)
BUN: 22 mg/dL — ABNORMAL HIGH (ref 6–20)
CO2: 25 mmol/L (ref 22–32)
Calcium: 9.1 mg/dL (ref 8.9–10.3)
Chloride: 107 mmol/L (ref 98–111)
Creatinine, Ser: 1.74 mg/dL — ABNORMAL HIGH (ref 0.61–1.24)
GFR calc Af Amer: 52 mL/min — ABNORMAL LOW (ref >60)
GFR calc non Af Amer: 45 mL/min — ABNORMAL LOW (ref >60)
Glucose, Bld: 77 mg/dL (ref 70–99)
Potassium: 4.9 mmol/L (ref 3.5–5.1)
Sodium: 139 mmol/L (ref 135–145)

## 2018-03-25 LAB — PROTIME-INR
INR: 2.1 — ABNORMAL HIGH (ref 0.8–1.2)
Prothrombin Time: 22.9 seconds — ABNORMAL HIGH (ref 11.4–15.2)

## 2018-03-27 MED FILL — ASPIRIN ADULT LOW STRENGTH: 81 | 30 days supply | Qty: 30 | Fill #1 | Status: TO

## 2018-03-27 MED FILL — glipiZIDE 5 MG TABS: 5 | 30 days supply | Qty: 30 | Fill #1

## 2018-04-02 ENCOUNTER — Telehealth (HOSPITAL_COMMUNITY): Payer: Self-pay | Admitting: Licensed Clinical Social Worker

## 2018-04-02 NOTE — Telephone Encounter (Signed)
Message left for return call to review needs for CoVid 19 public health concerns and emergency VAD pager number. Jackie Viviano Bir, LCSW, CCSW-MCS [phone] 

## 2018-04-05 ENCOUNTER — Other Ambulatory Visit (HOSPITAL_COMMUNITY): Payer: Self-pay | Admitting: *Deleted

## 2018-04-05 DIAGNOSIS — Z95811 Presence of heart assist device: Secondary | ICD-10-CM

## 2018-04-05 DIAGNOSIS — Z7901 Long term (current) use of anticoagulants: Secondary | ICD-10-CM

## 2018-04-08 ENCOUNTER — Other Ambulatory Visit: Payer: Self-pay

## 2018-04-08 ENCOUNTER — Ambulatory Visit (HOSPITAL_COMMUNITY): Payer: Self-pay | Admitting: Pharmacist

## 2018-04-08 ENCOUNTER — Ambulatory Visit (HOSPITAL_COMMUNITY)
Admission: RE | Admit: 2018-04-08 | Discharge: 2018-04-08 | Disposition: A | Discharge status: Home / Self Care | Payer: Medicaid Other | Source: Ambulatory Visit | Attending: Cardiology | Admitting: Cardiology

## 2018-04-08 DIAGNOSIS — Z7901 Long term (current) use of anticoagulants: Secondary | ICD-10-CM

## 2018-04-08 DIAGNOSIS — Z95811 Presence of heart assist device: Secondary | ICD-10-CM

## 2018-04-08 LAB — PROTIME-INR
INR: 2.2 — ABNORMAL HIGH (ref 0.8–1.2)
Prothrombin Time: 24.2 seconds — ABNORMAL HIGH (ref 11.4–15.2)

## 2018-04-19 ENCOUNTER — Telehealth (HOSPITAL_COMMUNITY): Payer: Self-pay | Admitting: Licensed Clinical Social Worker

## 2018-04-19 ENCOUNTER — Other Ambulatory Visit (HOSPITAL_COMMUNITY): Payer: Self-pay | Admitting: *Deleted

## 2018-04-19 DIAGNOSIS — I5022 Chronic systolic (congestive) heart failure: Secondary | ICD-10-CM

## 2018-04-19 DIAGNOSIS — Z7901 Long term (current) use of anticoagulants: Secondary | ICD-10-CM

## 2018-04-19 DIAGNOSIS — Z95811 Presence of heart assist device: Secondary | ICD-10-CM

## 2018-04-19 NOTE — Telephone Encounter (Signed)
Message left for return call to review needs for Covid 19 public health concerns and emergency VAD pager number. [name], LCSW, CCSW-MCS [phone]  

## 2018-04-22 ENCOUNTER — Telehealth: Payer: Self-pay | Admitting: *Deleted

## 2018-04-22 NOTE — Telephone Encounter (Signed)
Spoke with Benjamin Sherman regarding his upcoming appointment 04/23/2018. He states that he has been "feeling great." Denies fever, SOB, edema, dizziness, falls, bleeding, dark urine, and nausea. Gene is changing drive line dressing twice a week (Sunday/Thursday.) Small amount of drainage yesterday, but is unsure of color. Denies odor. Has adequate silver strips at home. Will provide with 10 daily kits when he comes for lab work.    VAD #s: Speed: 5300 Flow: 4.1 Power: 4.0 PI: 5.5 Denies alarms or issues with VAD equipment or drive line.   Instructed patient to call if he needs to be seen sooner, or needs dressing supplies. Will reschedule patient's full visit appointment to 05/22/2018 at 09:00. Instructed patient to come for lab work on 04/23/2018 at 10:00. Patient verbalized understanding of all the above.  Alyce Pagan RN VAD Coordinator  Office: [phone]  24/7 Pager: [phone]

## 2018-04-23 ENCOUNTER — Other Ambulatory Visit: Payer: Self-pay | Admitting: *Deleted

## 2018-04-23 ENCOUNTER — Other Ambulatory Visit: Payer: Self-pay | Admitting: Internal Medicine

## 2018-04-23 ENCOUNTER — Ambulatory Visit (HOSPITAL_COMMUNITY)
Admission: RE | Admit: 2018-04-23 | Discharge: 2018-04-23 | Disposition: A | Discharge status: Home / Self Care | Payer: Medicaid Other | Source: Ambulatory Visit | Attending: Internal Medicine | Admitting: Internal Medicine

## 2018-04-23 ENCOUNTER — Other Ambulatory Visit: Payer: Self-pay

## 2018-04-23 ENCOUNTER — Ambulatory Visit (HOSPITAL_COMMUNITY): Payer: Self-pay | Admitting: Pharmacist

## 2018-04-23 DIAGNOSIS — I5022 Chronic systolic (congestive) heart failure: Secondary | ICD-10-CM

## 2018-04-23 DIAGNOSIS — Z7901 Long term (current) use of anticoagulants: Secondary | ICD-10-CM

## 2018-04-23 DIAGNOSIS — Z95811 Presence of heart assist device: Secondary | ICD-10-CM

## 2018-04-23 LAB — LACTATE DEHYDROGENASE: LDH: 374 U/L — ABNORMAL HIGH (ref 98–192)

## 2018-04-23 LAB — CBC
HCT: 46.7 % (ref 39.0–52.0)
Hemoglobin: 15 g/dL (ref 13.0–17.0)
MCH: 29.7 pg (ref 26.0–34.0)
MCHC: 32.1 g/dL (ref 30.0–36.0)
MCV: 92.5 fL (ref 80.0–100.0)
Platelets: 256 10*3/uL (ref 150–400)
RBC: 5.05 MIL/uL (ref 4.22–5.81)
RDW: 14.6 % (ref 11.5–15.5)
WBC: 6.6 10*3/uL (ref 4.0–10.5)
nRBC: 0 % (ref 0.0–0.2)

## 2018-04-23 LAB — BASIC METABOLIC PANEL
Anion gap: 7 (ref 5–15)
BUN: 12 mg/dL (ref 6–20)
CO2: 27 mmol/L (ref 22–32)
Calcium: 8.9 mg/dL (ref 8.9–10.3)
Chloride: 105 mmol/L (ref 98–111)
Creatinine, Ser: 1.5 mg/dL — ABNORMAL HIGH (ref 0.61–1.24)
GFR calc Af Amer: >60 mL/min (ref >60)
GFR calc non Af Amer: 54 mL/min — ABNORMAL LOW (ref >60)
Glucose, Bld: 102 mg/dL — ABNORMAL HIGH (ref 70–99)
Potassium: 5 mmol/L (ref 3.5–5.1)
Sodium: 139 mmol/L (ref 135–145)

## 2018-04-23 LAB — PROTIME-INR
INR: 1.8 — ABNORMAL HIGH (ref 0.8–1.2)
Prothrombin Time: 20.5 seconds — ABNORMAL HIGH (ref 11.4–15.2)

## 2018-04-23 MED FILL — ASPIRIN ADULT LOW STRENGTH: 81 | 30 days supply | Qty: 30 | Fill #0

## 2018-04-23 MED FILL — glipiZIDE 5 MG TABS: 5 | 30 days supply | Qty: 30 | Fill #2

## 2018-04-23 MED FILL — GABAPENTIN 300 MG CAPSULE: 300 | 90 days supply | Qty: 90 | Fill #0

## 2018-05-02 ENCOUNTER — Telehealth (HOSPITAL_COMMUNITY): Payer: Self-pay | Admitting: *Deleted

## 2018-05-02 ENCOUNTER — Other Ambulatory Visit (HOSPITAL_COMMUNITY): Payer: Self-pay | Admitting: *Deleted

## 2018-05-02 DIAGNOSIS — Z95811 Presence of heart assist device: Secondary | ICD-10-CM

## 2018-05-02 DIAGNOSIS — Z7901 Long term (current) use of anticoagulants: Secondary | ICD-10-CM

## 2018-05-02 NOTE — Telephone Encounter (Signed)
NA

## 2018-05-07 ENCOUNTER — Ambulatory Visit (HOSPITAL_COMMUNITY): Payer: Self-pay | Admitting: Pharmacist

## 2018-05-07 ENCOUNTER — Ambulatory Visit (HOSPITAL_COMMUNITY)
Admission: RE | Admit: 2018-05-07 | Discharge: 2018-05-07 | Disposition: A | Discharge status: Home / Self Care | Payer: Medicaid Other | Source: Ambulatory Visit | Attending: Internal Medicine | Admitting: Internal Medicine

## 2018-05-07 ENCOUNTER — Other Ambulatory Visit: Payer: Self-pay

## 2018-05-07 DIAGNOSIS — Z95811 Presence of heart assist device: Secondary | ICD-10-CM | POA: Insufficient documentation

## 2018-05-07 DIAGNOSIS — Z7901 Long term (current) use of anticoagulants: Secondary | ICD-10-CM | POA: Insufficient documentation

## 2018-05-07 LAB — LACTATE DEHYDROGENASE: LDH: 301 U/L — ABNORMAL HIGH (ref 98–192)

## 2018-05-07 LAB — PROTIME-INR
INR: 2 — ABNORMAL HIGH (ref 0.8–1.2)
Prothrombin Time: 22.6 seconds — ABNORMAL HIGH (ref 11.4–15.2)

## 2018-05-21 ENCOUNTER — Other Ambulatory Visit (HOSPITAL_COMMUNITY): Payer: Self-pay | Admitting: *Deleted

## 2018-05-21 ENCOUNTER — Encounter (HOSPITAL_COMMUNITY): Payer: Self-pay

## 2018-05-21 ENCOUNTER — Telehealth (HOSPITAL_COMMUNITY): Payer: Self-pay | Admitting: *Deleted

## 2018-05-21 DIAGNOSIS — Z95811 Presence of heart assist device: Secondary | ICD-10-CM

## 2018-05-21 DIAGNOSIS — Z7901 Long term (current) use of anticoagulants: Secondary | ICD-10-CM

## 2018-05-21 NOTE — Telephone Encounter (Signed)
error 

## 2018-05-22 ENCOUNTER — Encounter (HOSPITAL_COMMUNITY): Payer: Self-pay

## 2018-05-22 ENCOUNTER — Other Ambulatory Visit: Payer: Self-pay

## 2018-05-22 ENCOUNTER — Other Ambulatory Visit (HOSPITAL_COMMUNITY): Payer: Self-pay | Admitting: *Deleted

## 2018-05-22 ENCOUNTER — Ambulatory Visit (HOSPITAL_COMMUNITY)
Admission: RE | Admit: 2018-05-22 | Discharge: 2018-05-22 | Disposition: A | Discharge status: Home / Self Care | Payer: Self-pay | Source: Ambulatory Visit | Attending: Internal Medicine | Admitting: Internal Medicine

## 2018-05-22 VITALS — Wt 246.0 lb

## 2018-05-22 DIAGNOSIS — I5022 Chronic systolic (congestive) heart failure: Secondary | ICD-10-CM

## 2018-05-22 DIAGNOSIS — Z95811 Presence of heart assist device: Secondary | ICD-10-CM

## 2018-05-22 DIAGNOSIS — Z7901 Long term (current) use of anticoagulants: Secondary | ICD-10-CM

## 2018-05-22 DIAGNOSIS — N182 Chronic kidney disease, stage 2 (mild): Secondary | ICD-10-CM

## 2018-05-22 MED ORDER — PANTOPRAZOLE SODIUM 40 MG PO TBEC: 40.0000 mg | DELAYED_RELEASE_TABLET | Freq: Every day | ORAL | 5 refills | Status: DC

## 2018-05-22 NOTE — Progress Notes (Signed)
Heart Failure TeleHealth Note  Due to national recommendations of social distancing due to COVID 19, Audio/video telehealth visit is felt to be most appropriate for this patient at this time.  See MyChart message from today for patient consent regarding [facility]health [facility].  Date:  05/22/2018   ID:  Benjamin Sherman, DOB July 22, 1970, MRN [id number]  Location: Home  Provider location: [place] [facility] Type of Visit: Established patient   PCP:  Patient, No Pcp Per  Cardiologist:  No primary care provider on file. Primary HF: Dr Gala RomneyBensimhon   Chief Complaint: Heart Failure   History of Present Illness: Benjamin Sherman is a 48 y.o. male with a history of systolic heart failure due to NICM diagnosed in 10/2016, LV thrombus, atrial flutter, hypothyroidism and CKD Stage II-III s/p HM-3 LVAD on 05/15/17. Admitted 10/18 with ADHF. ECHO showed severely reduced EF. 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 Placed on steroids without benefit.  Admitted 11/18 after fall/syncopal episode resulting in bifrontal SAH. Neurosurgery consulted.   Admitted 4/19 with cardiogenic shock in setting of AFL. Supported with inotropes and underwent DC-CV but had persistent shock. After numerous discussions about concern for adequate social support underwent placement of HM-3 LVAD on 05/15/17.D/c home 06/04/17  He presents via audio/video conferencing for a telehealth visit today.  Overall feeling fine. Denies SOB/PND/Orthopnea. Appetite ok. No fever or chills. Weight at home 246-249 pounds. Able 45 minutes. Pounds. He had not dripped his controller. On Sunday Gene noticed a  small amount of drainage around driveline.  Taking all medications.    he denies symptoms worrisome for COVID 19.   VAD Indication: Destination Therapy - due to limited social support. Pt [facility] with [facility] prior to LVAD implant.  LVAD Interrogation HM 3  Speed: 5400Flow: 3.6 PI:  9.4Power: 4.2   Past Medical History:  Diagnosis Date  . CHF (congestive heart failure) (HCC)   . Diabetes mellitus without complication [facility](HCC)    Past Surgical History:  Procedure Laterality Date  . CARDIOVERSION N/A 04/27/2017   Procedure: CARDIOVERSION;  Surgeon: Dolores PattyBensimhon, Daniel R, MD;  [facility];  Service: Cardiovascular;  Laterality: N/A;  . IABP INSERTION N/A 04/25/2017   Procedure: IABP INSERTION;  Surgeon: Dolores PattyBensimhon, Daniel R, MD;  Location: [facility] INVASIVE CV LAB;  Service: Cardiovascular;  Laterality: N/A;  . IABP INSERTION N/A 05/14/2017   Procedure: IABP INSERTION;  Surgeon: Dolores PattyBensimhon, Daniel R, MD;  Location: [facility] INVASIVE CV LAB;  Service: Cardiovascular;  Laterality: N/A;  . INSERTION OF IMPLANTABLE LEFT VENTRICULAR ASSIST DEVICE N/A 05/15/2017   Procedure: INSERTION OF IMPLANTABLE LEFT VENTRICULAR ASSIST DEVICE-HM3;  Surgeon: Kerin PernaVan Trigt, Peter, MD;  [facility];  Service: Open Heart Surgery;  Laterality: N/A;  HM3  CIRC ARREST  NITRIC OXIDE  . MULTIPLE EXTRACTIONS WITH ALVEOLOPLASTY N/A 05/10/2017   Procedure: Extraction of 2-6, 8-15, 17, 19-24, 27- 30, and 32 with alveoloplasty, and maxillary and mandibular lateral exotoses reductions.;  Surgeon: Charlynne PanderKulinski, Ronald F, DDS;  Location: [facility] OR;  Service: Oral Surgery;  Laterality: N/A;  . RIGHT HEART CATH N/A 04/25/2017   Procedure: RIGHT HEART CATH;  Surgeon: Dolores PattyBensimhon, Daniel R, MD;  Location: [facility] INVASIVE CV LAB;  Service: Cardiovascular;  Laterality: N/A;  . RIGHT HEART CATH N/A 05/14/2017   Procedure: RIGHT HEART CATH;  Surgeon: Dolores PattyBensimhon, Daniel R, MD;  Location: [facility] INVASIVE CV LAB;  Service: Cardiovascular;  Laterality: N/A;  . RIGHT/LEFT HEART CATH AND CORONARY ANGIOGRAPHY N/A  11/10/2016   Procedure: RIGHT/LEFT HEART CATH AND CORONARY ANGIOGRAPHY;  Surgeon: Dolores Patty, MD;  Location: [facility] INVASIVE CV LAB;  Service: Cardiovascular;  Laterality: N/A;  . TEE WITHOUT CARDIOVERSION N/A 05/15/2017   Procedure:  TRANSESOPHAGEAL ECHOCARDIOGRAM (TEE);  Surgeon: Donata Clay, Theron Arista, MD;  Location: [facility] OR;  Service: Open Heart Surgery;  Laterality: N/A;     Current Outpatient Medications  Medication Sig Dispense Refill  . allopurinol (ZYLOPRIM) 100 MG tablet Take 100 mg by mouth daily.    Marland Kitchen amiodarone (PACERONE) 200 MG tablet Take 1 tablet (200 mg total) by mouth daily. 30 tablet 6  . amLODipine (NORVASC) 5 MG tablet Take 1 tablet (5 mg total) by mouth daily. (Patient not taking: Reported on 02/22/2018) 180 tablet 3  . aspirin 81 MG EC tablet Take 1 tablet (81 mg total) by mouth daily. 30 tablet 6  . Blood Glucose Monitoring Suppl (TRUE METRIX METER) DEVI 1 each by Does not apply route daily with breakfast. 1 Device 0  . enoxaparin (LOVENOX) 40 MG/0.4ML injection Inject 40 mg into the skin every 12 (twelve) hours.    . ferrous sulfate 325 (65 FE) MG EC tablet Take 325 mg by mouth 2 (two) times daily.    Marland Kitchen gabapentin (NEURONTIN) 300 MG capsule TAKE 1 CAPSULE (300 MG TOTAL) BY MOUTH DAILY. 90 capsule 0  . glipiZIDE (GLUCOTROL) 5 MG tablet Take 0.5 tablets (2.5 mg total) by mouth 2 (two) times daily before a meal. 30 tablet 3  . glucose blood (TRUE METRIX BLOOD GLUCOSE TEST) test strip Use daily before breakfast 30 each 12  . pantoprazole (PROTONIX) 40 MG tablet TAKE 1 TABLET BY MOUTH DAILY. 30 tablet 0  . sacubitril-valsartan (ENTRESTO) 49-51 MG Take 1 tablet by mouth 2 (two) times daily. 60 tablet 60  . TRUEPLUS LANCETS 28G MISC 1 each by Does not apply route daily. 30 each 12  . warfarin (COUMADIN) 6 MG tablet Take 1/2 tablet (3 mg) daily except 1 tablet (6 mg) on Monday and Friday     No current facility-administered medications for this encounter.     Allergies:   Bee venom   Social History:  The patient  reports that he quit smoking about 18 months ago. His smoking use included cigars. He has never used smokeless tobacco. He reports current alcohol use of about 14.0 standard drinks of alcohol per week. He  reports that he does not use drugs.   Family History:  The patient's family history includes CAD in his father.   ROS:  Please see the history of present illness.   All other systems are personally reviewed and negative.   Exam:  Video Health Call; Exam is visual. General:  Speaks in full sentences. No resp difficulty. Lungs: Normal respiratory effort with conversation.  Abdomen: Non-distended per patient report. No drainage noted on the outside of dressing.  Extremities: Pt denies edema. Neuro: Alert & oriented x 3.   Recent Labs: 05/24/2017: Magnesium 1.9 01/31/2018: ALT 24 02/19/2018: TSH 2.900 04/23/2018: BUN 12; Creatinine, Ser 1.50; Hemoglobin 15.0; Platelets 256; Potassium 5.0; Sodium 139  Personally reviewed   Wt Readings from Last 3 Encounters:  05/22/18 111.6 kg (246 lb)  02/22/18 112.1 kg (247 lb 3.2 oz)  02/19/18 112.5 kg (248 lb)      ASSESSMENT AND PLAN:  1. Chronic Combined Systolic/Diastolic Heart Failure. NICM  - ECHO 4/19 EF 20% CMRI EF 22%  - s/p HM-3 LVAD on 05/15/17 -NYHA I. Doing great. Continue current  regimen.   2. VAD management - VAD interrogated with him. l - LDH stable on 05/07/18 at 301.   -Set up for INR check  - Continue ASA 81 mg daily + coumadin. I have asked him follow up tomorrow for dressing change. He had small amount drainage reported on Sunday. We need to put on keflex again but will wait and see how it looks tomorrow.  -INR goal 2-2.5.  3. AFL - Continue amio 200 mg daily for now.   4. HTN -Continue current regimen.   5. Hypothyroidism - TSH mildly elevated 06/2017 at 5.4, but T3 and T4 normal.    6. DMII  - Per PCP  7. CKD 3 Renal function stable from 04/23/18   COVID screen The patient does not have any symptoms that suggest any further testing/ screening at this time.  Social distancing reinforced today.  Patient Risk: After full review of this patients clinical status, I feel that they are at moderate risk for cardiac  decompensation at this time.  Relevant cardiac medications were reviewed at length with the patient today. The patient does not have concerns regarding their medications at this time.   The following changes were made today:  Refill Protonix.  Recommended follow-up:  Follow up tomorrow for INR check and dressing change. Follow up for full visit in 2 months   Today, I have spent 25 minutes with the patient with telehealth technology discussing the above issues .    Waneta Martins, NP  05/22/2018 9:13 AM  [facility] [address] [facility] [place] [address] [phone] (office) [phone] (fax)

## 2018-05-23 ENCOUNTER — Encounter (HOSPITAL_COMMUNITY): Payer: Self-pay | Admitting: *Deleted

## 2018-05-23 ENCOUNTER — Ambulatory Visit (HOSPITAL_COMMUNITY)
Admission: RE | Admit: 2018-05-23 | Discharge: 2018-05-23 | Disposition: A | Discharge status: Home / Self Care | Payer: Medicaid Other | Source: Ambulatory Visit | Attending: Internal Medicine | Admitting: Internal Medicine

## 2018-05-23 ENCOUNTER — Other Ambulatory Visit: Payer: Self-pay

## 2018-05-23 ENCOUNTER — Ambulatory Visit (HOSPITAL_COMMUNITY): Payer: Self-pay | Admitting: Pharmacist

## 2018-05-23 ENCOUNTER — Encounter (HOSPITAL_COMMUNITY): Payer: Self-pay

## 2018-05-23 VITALS — BP 111/84 | HR 82 | Wt 255.0 lb

## 2018-05-23 DIAGNOSIS — Z4801 Encounter for change or removal of surgical wound dressing: Secondary | ICD-10-CM | POA: Insufficient documentation

## 2018-05-23 DIAGNOSIS — I10 Essential (primary) hypertension: Secondary | ICD-10-CM

## 2018-05-23 DIAGNOSIS — Z95811 Presence of heart assist device: Secondary | ICD-10-CM

## 2018-05-23 DIAGNOSIS — Z7901 Long term (current) use of anticoagulants: Secondary | ICD-10-CM | POA: Insufficient documentation

## 2018-05-23 DIAGNOSIS — I5022 Chronic systolic (congestive) heart failure: Secondary | ICD-10-CM

## 2018-05-23 LAB — BASIC METABOLIC PANEL
Anion gap: 10 (ref 5–15)
BUN: 22 mg/dL — ABNORMAL HIGH (ref 6–20)
CO2: 24 mmol/L (ref 22–32)
Calcium: 9.3 mg/dL (ref 8.9–10.3)
Chloride: 106 mmol/L (ref 98–111)
Creatinine, Ser: 1.67 mg/dL — ABNORMAL HIGH (ref 0.61–1.24)
GFR calc Af Amer: 55 mL/min — ABNORMAL LOW (ref >60)
GFR calc non Af Amer: 47 mL/min — ABNORMAL LOW (ref >60)
Glucose, Bld: 113 mg/dL — ABNORMAL HIGH (ref 70–99)
Potassium: 4.9 mmol/L (ref 3.5–5.1)
Sodium: 140 mmol/L (ref 135–145)

## 2018-05-23 LAB — PROTIME-INR
INR: 2.5 — ABNORMAL HIGH (ref 0.8–1.2)
Prothrombin Time: 26.5 seconds — ABNORMAL HIGH (ref 11.4–15.2)

## 2018-05-23 LAB — LACTATE DEHYDROGENASE: LDH: 263 U/L — ABNORMAL HIGH (ref 98–192)

## 2018-05-23 LAB — CBC
HCT: 47.7 % (ref 39.0–52.0)
Hemoglobin: 15.2 g/dL (ref 13.0–17.0)
MCH: 29.7 pg (ref 26.0–34.0)
MCHC: 31.9 g/dL (ref 30.0–36.0)
MCV: 93.2 fL (ref 80.0–100.0)
Platelets: 224 10*3/uL (ref 150–400)
RBC: 5.12 MIL/uL (ref 4.22–5.81)
RDW: 15.3 % (ref 11.5–15.5)
WBC: 7.5 10*3/uL (ref 4.0–10.5)
nRBC: 0 % (ref 0.0–0.2)

## 2018-05-23 MED ORDER — PANTOPRAZOLE SODIUM 40 MG PO TBEC: 40.0000 mg | DELAYED_RELEASE_TABLET | Freq: Every day | ORAL | 5 refills | Status: DC

## 2018-05-23 MED ORDER — SACUBITRIL-VALSARTAN 49-51 MG PO TABS: 1.0000 | ORAL_TABLET | Freq: Two times a day (BID) | ORAL | 4 refills | Status: DC

## 2018-05-23 MED ORDER — SACUBITRIL-VALSARTAN 97-103 MG PO TABS: 1.0000 | ORAL_TABLET | Freq: Two times a day (BID) | ORAL | 4 refills | Status: DC

## 2018-05-23 NOTE — Patient Instructions (Addendum)
1. Increase Entresto dose to 49-51mg  twice a day.   You currently have 24-36mg  tablets. You may take 2 24-36mg  tablets in the morning and 2 24-36mg  tablets in the evening while you are waiting on your updated prescription to arrive.   Once you have received 49-51mg  tablets only take 1 tablet in the morning, and 1 tablet in the evening.   2. Misty Stanley PharmD will be in touch regarding Coumadin dosing. 3. Return to clinic in 1 week for BP check

## 2018-05-23 NOTE — Addendum Note (Signed)
Encounter addended by: Bernita Raisin, RN on: 05/23/2018 12:15 PM  Actions taken: Clinical Note Signed

## 2018-05-23 NOTE — Progress Notes (Addendum)
Patient presents for BP check and dressing change in VAD Clinic today. Reports no problems with VAD equipment or concerns with drive line.   MAP slightly elevated today. 30-70 PI events daily. States he is drinking 2L per day. He has taken his morning medications. Patient states he has been taking Entresto 49-51mg  twice a day. Dr. Gala Romney aware. Will increase Entresto.   Dose increase called in to [facility] Patient Assistance [phone]. Pharmacist there states they have been shipping patient 24-26mg  tablets. Confirmed with Brett Canales that he is taking 1 tablet twice a day. He said yes, but is now unsure of what dose the tablets are. Patient to look at pill bottle once home and call VAD coordinator to confirm he is taking 24-26mg  tablets. Verbal order given to pharmacist for dose increase Entresto 49-51mg  BID. Dr. Gala Romney made aware of dosing.   Pt states that he has been walking outside daily for 30 mins. Weight up 7lbs from last visit.    Vital Signs:        Doppler Pressure: 118 Automatc BP: 111/84 (95)     HR: 82       SPO2: 99% on RA       Weight: 255.0 lb w/o eqt  Last weight: 247.2 lb  VAD Indication: Destination Therapy - due to limited social support. Pt discussed with Dr. Edwena Blow at [facility] prior to LVAD implant.  LVAD assessment:      Speed: 5300 RPM      Flow: 3.8       Power: 4.0 w         PI: 7.8        Alarms: none Events: 30-70 daily Hct: 20-tried to increase this today but flow dropped to low 3s        Fixed speed: 5300 Low speed limit: 5000  Primary Controller: Replace back up battery in 24 months. Back up controller: Replace back up battery in 24months.  I reviewed the LVAD parameters from today and compared the results to the patient's prior recorded data. LVAD interrogation was NEGATIVE for significant power changes, NEGATIVE for clinical alarms and STABLE for PI events/speed drops. No programming changes were made and pump is functioning within specified  parameters. Pt is performing daily controller and system monitor self tests along with completing weekly and monthly maintenance for LVAD equipment.  LVAD equipment check completed and is in good working order. Back-up equipment present.   Exit Site Care: Gene is doing twice a week dressing changes. Last changed on Sunday. Existing VAD dressing removed and site care performed using sterile technique. Drive line exit site cleaned with Chlora prep applicators x 2, allowed to dry, and gauze dressing with silver strip re-applied. Exit site healed and incorporated, the velour is fully implanted at exit site. Small amount of yellow/brown dry drainage on gauze with no tenderness in and around drive line. No redness, tenderness, foul odor or rash noted. Drive line anchor re-applied. Pt denies fever or chills. Pt instructed to continue changing dressing twice a week. Patient states he has adequate dressing supplies at home.           Device:  N/A  BP & Labs:  Doppler 118 - is reflecting modified systolic  Hgb 15.2 - No S/S of bleeding. Specifically denies melena/BRBPR or nosebleeds.  LDH 263 - and is within established baseline of 200 - 400. Denies tea-colored urine. No power elevations noted on interrogation.    1 year Intermacs follow up completed including:  Quality  of Life, KCCQ-12, and Neurocognitive trail making.   Pt completed 1400 feet during 6 minute walk.   Patient Instructions:  1. Increase Entresto to 49-51mg  BID.  2. Coumadin dosing per Misty Stanley PharmD.  3. Return to VAD clinic in 1 week for INR & BP check.  4. Return to clinic in 2 months for full VAD visit with annual maintenance.      Alyce Pagan RN VAD Coordinator  Office: [phone]  24/7 Pager: [phone]

## 2018-05-23 NOTE — Addendum Note (Signed)
Encounter addended by: Bernita Raisin, RN on: 05/23/2018 12:21 PM  Actions taken: Clinical Note Signed

## 2018-05-23 NOTE — Addendum Note (Signed)
Encounter addended by: Bernita Raisin, RN on: 05/23/2018 11:24 AM  Actions taken: Vitals modified, Home Medications modified, Medication List reviewed, Medication taking status modified, Pharmacy for encounter modified, Order Reconciliation Section accessed, Visit diagnoses modified, Order list changed, Diagnosis association updated, Pend clinical note, Clinical Note Signed

## 2018-05-24 ENCOUNTER — Other Ambulatory Visit (HOSPITAL_COMMUNITY): Payer: Self-pay | Admitting: *Deleted

## 2018-05-24 DIAGNOSIS — Z95811 Presence of heart assist device: Secondary | ICD-10-CM

## 2018-05-24 DIAGNOSIS — Z7901 Long term (current) use of anticoagulants: Secondary | ICD-10-CM

## 2018-05-24 MED FILL — ?GLIPIZIDE 5MG TABLET: 5 | 30 days supply | Qty: 30 | Fill #3

## 2018-05-30 ENCOUNTER — Ambulatory Visit (HOSPITAL_COMMUNITY): Payer: Self-pay | Admitting: Pharmacist

## 2018-05-30 ENCOUNTER — Other Ambulatory Visit (HOSPITAL_COMMUNITY): Payer: Self-pay | Admitting: *Deleted

## 2018-05-30 ENCOUNTER — Other Ambulatory Visit: Payer: Self-pay

## 2018-05-30 ENCOUNTER — Encounter (HOSPITAL_COMMUNITY): Payer: Self-pay

## 2018-05-30 ENCOUNTER — Ambulatory Visit (HOSPITAL_COMMUNITY)
Admission: RE | Admit: 2018-05-30 | Discharge: 2018-05-30 | Disposition: A | Discharge status: Home / Self Care | Payer: Medicaid Other | Source: Ambulatory Visit | Attending: Cardiology | Admitting: Cardiology

## 2018-05-30 VITALS — BP 106/74 | HR 80 | Ht 72.0 in | Wt 257.2 lb

## 2018-05-30 DIAGNOSIS — Z95811 Presence of heart assist device: Secondary | ICD-10-CM

## 2018-05-30 DIAGNOSIS — N183 Chronic kidney disease, stage 3 unspecified: Secondary | ICD-10-CM

## 2018-05-30 DIAGNOSIS — Z7901 Long term (current) use of anticoagulants: Secondary | ICD-10-CM

## 2018-05-30 DIAGNOSIS — I5022 Chronic systolic (congestive) heart failure: Secondary | ICD-10-CM

## 2018-05-30 LAB — BASIC METABOLIC PANEL
Anion gap: 8 (ref 5–15)
BUN: 15 mg/dL (ref 6–20)
CO2: 26 mmol/L (ref 22–32)
Calcium: 9.3 mg/dL (ref 8.9–10.3)
Chloride: 104 mmol/L (ref 98–111)
Creatinine, Ser: 1.69 mg/dL — ABNORMAL HIGH (ref 0.61–1.24)
GFR calc Af Amer: 54 mL/min — ABNORMAL LOW (ref >60)
GFR calc non Af Amer: 47 mL/min — ABNORMAL LOW (ref >60)
Glucose, Bld: 160 mg/dL — ABNORMAL HIGH (ref 70–99)
Potassium: 4.9 mmol/L (ref 3.5–5.1)
Sodium: 138 mmol/L (ref 135–145)

## 2018-05-30 LAB — PROTIME-INR
INR: 2.4 — ABNORMAL HIGH (ref 0.8–1.2)
Prothrombin Time: 25.6 seconds — ABNORMAL HIGH (ref 11.4–15.2)

## 2018-05-30 MED ORDER — ALLOPURINOL 100 MG PO TABS: 100.0000 mg | ORAL_TABLET | Freq: Every day | ORAL | 3 refills | Status: DC

## 2018-05-30 MED ORDER — WARFARIN SODIUM 6 MG PO TABS: ORAL_TABLET | ORAL | 3 refills | Status: DC

## 2018-05-30 MED ORDER — AMLODIPINE BESYLATE 5 MG PO TABS: 5.0000 mg | ORAL_TABLET | Freq: Every day | ORAL | 3 refills | Status: DC

## 2018-05-30 MED ORDER — AMIODARONE HCL 200 MG PO TABS: 200.0000 mg | ORAL_TABLET | Freq: Every day | ORAL | 3 refills | Status: DC

## 2018-05-30 NOTE — Patient Instructions (Signed)
1. Compare your meds at home with your Medication list on your After Visit Summary today. Replace any missing medications and take as directed. 2. Return to VAD clinic in 2 weeks for BP check. Remember to bring your meds to every clinic visit. 3. Advance to weekly dressing changes using gauze dressing with silver strip. If drainage occurs, go back to twice weekly dressing changes.

## 2018-05-30 NOTE — Progress Notes (Signed)
Patient presents for BP check and dressing change in VAD Clinic today. Reports no problems with VAD equipment or concerns with drive line.   Pt confirms he increased his Entresto dose as instructed last clinic visit; he has been taking 2 of the 24/26 mg tabs twice daily. Reports his new 49/51 mg tabs just arrived.   BP remains elevated, but patient very unsure of what meds he is taking at home. He doesn't think he is taking amlodipine, allopurinol, or amiodarone. After visit summary printed with instructions for patient to compare what he is currently taking and pick up any missing meds and take as directed. Refills sent on the above medications. Pt is to return to VAD clinic in two weeks for BP check; emphasized importance of bringing meds to each clinic visit.   Vital Signs:  Temp: 98.3       Doppler Pressure: 110 Automatc BP: 106/74 (91)     HR: 80      SPO2: UTO       Weight: 257.2 lb w/o eqt  Last weight: 255 lb  VAD Indication: Destination Therapy - due to limited social support. Pt discussed with Dr. Edwena Blow at [facility] prior to LVAD implant.  LVAD assessment:      Speed: 5300 RPM      Flow: 3.9       Power: 4.1w         PI: 6.6       Alarms: none Events: >80 yesterday Hct: 20        LVAD equipment check completed and is in good working order. Back-up equipment present.   Exit Site Care: Benjamin Sherman is doing twice a week dressing changes. Last changed on Sunday. Existing VAD dressing removed and site care performed using sterile technique. Drive line exit site cleaned with Chlora prep applicators x 2, allowed to dry, and gauze dressing with silver strip re-applied. Exit site healed and incorporated, the velour is fully implanted at exit site. Very small amount serous drainage on silver strip with no tenderness in and around drive line. No redness, tenderness, foul odor or rash noted. Drive line anchor re-applied. Pt denies fever or chills. Pt instructed to advance to weekly dressing  changes using gauze dressing with silver strip. Will re-assess next visit to see if we can advance to weekly dressing ktis. Patient provided with 5 anchors for home use.   Patient Instructions:  1. Compare your meds at home with your Medication list on your After Visit Summary today. Replace any missing medications and take as directed. 2. Return to VAD clinic in 2 weeks for BP check. Remember to bring your meds to every clinic visit. 3. Advance to weekly dressing changes using gauze dressing with silver strip. If drainage occurs, go back to twice weekly dressing changes.    Benjamin Diener RN VAD Coordinator  Office: [phone]  24/7 Pager: [phone]

## 2018-05-30 NOTE — Addendum Note (Signed)
Encounter addended by: Levonne Spiller, RN on: 05/30/2018 1:11 PM  Actions taken: Vitals modified, Home Medications modified, Medication taking status modified, Visit diagnoses modified, Pharmacy for encounter modified, Diagnosis association updated, Order list changed, Order Reconciliation Section accessed, Medication List reviewed, Clinical Note Signed

## 2018-05-30 NOTE — Addendum Note (Signed)
Encounter addended by: Levonne Spiller, RN on: 05/30/2018 11:53 AM  Actions taken: Order list changed, Home Medications modified, Medication List reviewed, Medication taking status modified, Order Reconciliation Section accessed

## 2018-06-07 ENCOUNTER — Other Ambulatory Visit (HOSPITAL_COMMUNITY): Payer: Self-pay | Admitting: *Deleted

## 2018-06-07 DIAGNOSIS — Z7901 Long term (current) use of anticoagulants: Secondary | ICD-10-CM

## 2018-06-07 DIAGNOSIS — Z95811 Presence of heart assist device: Secondary | ICD-10-CM

## 2018-06-12 ENCOUNTER — Telehealth (HOSPITAL_COMMUNITY): Payer: Self-pay

## 2018-06-12 NOTE — Telephone Encounter (Signed)
COVID-19 pre-appointment screening questions:   Do you have a history of COVID-19 or a positive test result in the past 7-10 days? no  To the best of your knowledge, have you been in close contact with anyone with a confirmed diagnosis of COVID 19? no  Have you had any one or more of the following: Fever, chills, cough, shortness of breath (out of the normal for you) or any flu-like symptoms? no  Are you experiencing any of the following symptoms that is new or out of usual for you:  . Ear, nose or throat discomfort . Sore throat . Headache . Muscle Pain . Diarrhea . Loss of taste or smell   Reviewed all the following with patient: . Use of hand sanitizer when entering the building . Everyone is required to wear a mask in the building, if you do not have a mask we are happy to provide you with one when you arrive . NO Visitor guidelines   If patient answers YES to any of questions they must change to a virtual visit and place note in comments about symptoms  

## 2018-06-13 ENCOUNTER — Other Ambulatory Visit: Payer: Self-pay

## 2018-06-13 ENCOUNTER — Ambulatory Visit (HOSPITAL_COMMUNITY)
Admission: RE | Admit: 2018-06-13 | Discharge: 2018-06-13 | Disposition: A | Discharge status: Home / Self Care | Payer: Medicaid Other | Source: Ambulatory Visit | Attending: Internal Medicine | Admitting: Internal Medicine

## 2018-06-13 ENCOUNTER — Ambulatory Visit (HOSPITAL_COMMUNITY): Payer: Self-pay | Admitting: Pharmacist

## 2018-06-13 DIAGNOSIS — Z95811 Presence of heart assist device: Secondary | ICD-10-CM

## 2018-06-13 DIAGNOSIS — I5022 Chronic systolic (congestive) heart failure: Secondary | ICD-10-CM

## 2018-06-13 DIAGNOSIS — Z7901 Long term (current) use of anticoagulants: Secondary | ICD-10-CM | POA: Insufficient documentation

## 2018-06-13 DIAGNOSIS — N183 Chronic kidney disease, stage 3 unspecified: Secondary | ICD-10-CM

## 2018-06-13 LAB — PROTIME-INR
INR: 1.6 — ABNORMAL HIGH (ref 0.8–1.2)
Prothrombin Time: 18.8 seconds — ABNORMAL HIGH (ref 11.4–15.2)

## 2018-06-13 MED ORDER — AMLODIPINE BESYLATE 5 MG PO TABS: 5.0000 mg | ORAL_TABLET | Freq: Every day | ORAL | 3 refills | Status: DC

## 2018-06-13 NOTE — Progress Notes (Signed)
Patient presents for BP check and dressing change in VAD Clinic today. Reports no problems with VAD equipment or concerns with drive line.   Pt confirms he increased his Entresto dose as instructed last clinic visit on the 49-51. He confirms that he is taking amiodarone but is not taking amlodipine or allopurinol.   Vital Signs:       Doppler Pressure: 102 Automatc BP: 119/69 (106)     HR: 80      SPO2: UTO       Weight: 257.2 lb w/o eqt  Last weight: 255 lb  VAD Indication: Destination Therapy - due to limited social support. Pt discussed with Dr. Mosetta Pigeon at [facility] prior to LVAD implant.  Exit Site Care: Benjamin Sherman is doing twice a week dressing changes. Last changed on Sunday. Existing VAD dressing removed and site care performed using sterile technique. Drive line exit site cleaned with Chlora prep applicators x 2, allowed to dry, and gauze dressing with silver strip re-applied. Exit site healed and incorporated, the velour is fully implanted at exit site. Scant amount of dried serous drainage on silver strip with no tenderness in and around drive line. No redness, tenderness, foul odor or rash noted. Drive line anchor re-applied. Pt denies fever or chills. Pt instructed to advance to weekly dressing changes using the weekly kit. Patient provided with 7 weekly kits for home use.   Patient Instructions:  1. Restart Amlodipine 5 mg daily 2. Return to [place] clinic in 2 weeks for BP check. Remember to bring your meds to every clinic visit. 3. Advance to weekly dressing changes using weekly kit. If drainage occurs, go back to gauze dressing kits.  Tanda Rockers RN [place] Coordinator  Office: [phone]  24/7 Pager: [phone]

## 2018-06-13 NOTE — Addendum Note (Signed)
Encounter addended by: Lebron Quam, RN on: 06/13/2018 1:29 PM  Actions taken: Home Medications modified, Medication List reviewed, Medication taking status modified, Order Reconciliation Section accessed, Pharmacy for encounter modified, Order list changed, Diagnosis association updated, Clinical Note Signed

## 2018-06-14 ENCOUNTER — Other Ambulatory Visit (HOSPITAL_COMMUNITY): Payer: Self-pay | Admitting: *Deleted

## 2018-06-14 DIAGNOSIS — Z7901 Long term (current) use of anticoagulants: Secondary | ICD-10-CM

## 2018-06-14 DIAGNOSIS — Z95811 Presence of heart assist device: Secondary | ICD-10-CM

## 2018-06-20 ENCOUNTER — Other Ambulatory Visit (HOSPITAL_COMMUNITY): Payer: Medicaid Other

## 2018-07-01 ENCOUNTER — Inpatient Hospital Stay (HOSPITAL_COMMUNITY): Admission: RE | Admit: 2018-07-01 | Payer: Medicaid Other | Source: Ambulatory Visit

## 2018-07-03 ENCOUNTER — Ambulatory Visit (HOSPITAL_COMMUNITY): Payer: Self-pay | Admitting: *Deleted

## 2018-07-03 ENCOUNTER — Ambulatory Visit (HOSPITAL_COMMUNITY)
Admission: RE | Admit: 2018-07-03 | Discharge: 2018-07-03 | Disposition: A | Discharge status: Home / Self Care | Payer: Medicaid Other | Source: Ambulatory Visit | Attending: Internal Medicine | Admitting: Internal Medicine

## 2018-07-03 ENCOUNTER — Other Ambulatory Visit: Payer: Self-pay

## 2018-07-03 DIAGNOSIS — Z95811 Presence of heart assist device: Secondary | ICD-10-CM | POA: Insufficient documentation

## 2018-07-03 DIAGNOSIS — Z7901 Long term (current) use of anticoagulants: Secondary | ICD-10-CM | POA: Insufficient documentation

## 2018-07-03 LAB — PROTIME-INR
INR: 2.6 — ABNORMAL HIGH (ref 0.8–1.2)
Prothrombin Time: 27.2 seconds — ABNORMAL HIGH (ref 11.4–15.2)

## 2018-07-05 ENCOUNTER — Other Ambulatory Visit: Payer: Self-pay | Admitting: Family Medicine

## 2018-07-05 DIAGNOSIS — E1169 Type 2 diabetes mellitus with other specified complication: Secondary | ICD-10-CM

## 2018-07-05 MED FILL — glipiZIDE 5 MG TABS: 5 | 30 days supply | Qty: 15 | Fill #0

## 2018-07-18 ENCOUNTER — Telehealth (HOSPITAL_COMMUNITY): Payer: Self-pay | Admitting: *Deleted

## 2018-07-18 NOTE — Telephone Encounter (Signed)
COVID screening completed. ° °-[facility] °

## 2018-07-22 ENCOUNTER — Other Ambulatory Visit (HOSPITAL_COMMUNITY): Payer: Self-pay | Admitting: *Deleted

## 2018-07-22 DIAGNOSIS — Z7901 Long term (current) use of anticoagulants: Secondary | ICD-10-CM

## 2018-07-22 DIAGNOSIS — Z95811 Presence of heart assist device: Secondary | ICD-10-CM

## 2018-07-22 DIAGNOSIS — I5043 Acute on chronic combined systolic (congestive) and diastolic (congestive) heart failure: Secondary | ICD-10-CM

## 2018-07-22 MED FILL — glipiZIDE 5 MG TABS: 5 | 30 days supply | Qty: 15 | Fill #1

## 2018-07-23 ENCOUNTER — Other Ambulatory Visit: Payer: Self-pay

## 2018-07-23 ENCOUNTER — Encounter (HOSPITAL_COMMUNITY): Payer: Self-pay

## 2018-07-23 ENCOUNTER — Other Ambulatory Visit (HOSPITAL_COMMUNITY): Payer: Self-pay | Admitting: *Deleted

## 2018-07-23 ENCOUNTER — Ambulatory Visit (HOSPITAL_COMMUNITY): Payer: Self-pay | Admitting: Pharmacist

## 2018-07-23 ENCOUNTER — Ambulatory Visit (HOSPITAL_COMMUNITY)
Admission: RE | Admit: 2018-07-23 | Discharge: 2018-07-23 | Disposition: A | Discharge status: Home / Self Care | Payer: Self-pay | Source: Ambulatory Visit | Attending: Cardiology | Admitting: Cardiology

## 2018-07-23 VITALS — BP 101/77 | HR 78 | Temp 98.7°F | Ht 72.0 in | Wt 252.4 lb

## 2018-07-23 DIAGNOSIS — I428 Other cardiomyopathies: Secondary | ICD-10-CM | POA: Insufficient documentation

## 2018-07-23 DIAGNOSIS — E1122 Type 2 diabetes mellitus with diabetic chronic kidney disease: Secondary | ICD-10-CM | POA: Insufficient documentation

## 2018-07-23 DIAGNOSIS — Z7982 Long term (current) use of aspirin: Secondary | ICD-10-CM | POA: Insufficient documentation

## 2018-07-23 DIAGNOSIS — N183 Chronic kidney disease, stage 3 unspecified: Secondary | ICD-10-CM

## 2018-07-23 DIAGNOSIS — I5043 Acute on chronic combined systolic (congestive) and diastolic (congestive) heart failure: Secondary | ICD-10-CM

## 2018-07-23 DIAGNOSIS — Z95811 Presence of heart assist device: Secondary | ICD-10-CM

## 2018-07-23 DIAGNOSIS — Z7901 Long term (current) use of anticoagulants: Secondary | ICD-10-CM | POA: Insufficient documentation

## 2018-07-23 DIAGNOSIS — Z8249 Family history of ischemic heart disease and other diseases of the circulatory system: Secondary | ICD-10-CM | POA: Insufficient documentation

## 2018-07-23 DIAGNOSIS — Z91030 Bee allergy status: Secondary | ICD-10-CM | POA: Insufficient documentation

## 2018-07-23 DIAGNOSIS — I480 Paroxysmal atrial fibrillation: Secondary | ICD-10-CM

## 2018-07-23 DIAGNOSIS — Z79899 Other long term (current) drug therapy: Secondary | ICD-10-CM | POA: Insufficient documentation

## 2018-07-23 DIAGNOSIS — E039 Hypothyroidism, unspecified: Secondary | ICD-10-CM | POA: Insufficient documentation

## 2018-07-23 DIAGNOSIS — I5042 Chronic combined systolic (congestive) and diastolic (congestive) heart failure: Secondary | ICD-10-CM | POA: Insufficient documentation

## 2018-07-23 DIAGNOSIS — I10 Essential (primary) hypertension: Secondary | ICD-10-CM

## 2018-07-23 DIAGNOSIS — I4892 Unspecified atrial flutter: Secondary | ICD-10-CM | POA: Insufficient documentation

## 2018-07-23 DIAGNOSIS — I5020 Unspecified systolic (congestive) heart failure: Secondary | ICD-10-CM

## 2018-07-23 DIAGNOSIS — Z87891 Personal history of nicotine dependence: Secondary | ICD-10-CM | POA: Insufficient documentation

## 2018-07-23 DIAGNOSIS — I130 Hypertensive heart and chronic kidney disease with heart failure and stage 1 through stage 4 chronic kidney disease, or unspecified chronic kidney disease: Secondary | ICD-10-CM | POA: Insufficient documentation

## 2018-07-23 DIAGNOSIS — I5022 Chronic systolic (congestive) heart failure: Secondary | ICD-10-CM

## 2018-07-23 LAB — BASIC METABOLIC PANEL
Anion gap: 10 (ref 5–15)
BUN: 20 mg/dL (ref 6–20)
CO2: 23 mmol/L (ref 22–32)
Calcium: 9.3 mg/dL (ref 8.9–10.3)
Chloride: 105 mmol/L (ref 98–111)
Creatinine, Ser: 1.99 mg/dL — ABNORMAL HIGH (ref 0.61–1.24)
GFR calc Af Amer: 44 mL/min — ABNORMAL LOW (ref >60)
GFR calc non Af Amer: 38 mL/min — ABNORMAL LOW (ref >60)
Glucose, Bld: 141 mg/dL — ABNORMAL HIGH (ref 70–99)
Potassium: 4.7 mmol/L (ref 3.5–5.1)
Sodium: 138 mmol/L (ref 135–145)

## 2018-07-23 LAB — PROTIME-INR
INR: 2.2 — ABNORMAL HIGH (ref 0.8–1.2)
Prothrombin Time: 23.7 seconds — ABNORMAL HIGH (ref 11.4–15.2)

## 2018-07-23 LAB — CBC
HCT: 45.3 % (ref 39.0–52.0)
Hemoglobin: 14.7 g/dL (ref 13.0–17.0)
MCH: 30 pg (ref 26.0–34.0)
MCHC: 32.5 g/dL (ref 30.0–36.0)
MCV: 92.4 fL (ref 80.0–100.0)
Platelets: 225 10*3/uL (ref 150–400)
RBC: 4.9 MIL/uL (ref 4.22–5.81)
RDW: 15.5 % (ref 11.5–15.5)
WBC: 7.1 10*3/uL (ref 4.0–10.5)
nRBC: 0 % (ref 0.0–0.2)

## 2018-07-23 LAB — LACTATE DEHYDROGENASE: LDH: 312 U/L — ABNORMAL HIGH (ref 98–192)

## 2018-07-23 MED ORDER — AMIODARONE HCL 100 MG PO TABS: 100.0000 mg | ORAL_TABLET | Freq: Every day | ORAL | 3 refills | Status: DC

## 2018-07-23 MED ORDER — ASPIRIN 81 MG PO TBEC: 81.0000 mg | DELAYED_RELEASE_TABLET | Freq: Every day | ORAL | 3 refills | Status: DC

## 2018-07-23 NOTE — Progress Notes (Addendum)
Patient presents for 2 mo  follow up in [facility] today. Reports no problems with VAD equipment or concerns with drive line.   Pt says he has no limitations to daily activities. He is observing Covid precautions.   Caregiver is performing weekly dressing changes on Thursdays, but patient reports "some drainage". Pt has started playing golf and admits he is "sweating more" and admits the [place] dressing and bio patch do get wet. See below.   Pt is not taking allopurinol, but denies any gout symptoms. Pt is not taking ASA 81 mg daily, but says he "needs to pick up new Rx" - 90 day Rx sent for ASA 81 mg daily. Pt is taking Amiodarone 100 mg daily (prescribed 200 mg daily) - per Dr. Haroldine Laws will drop to 100 mg daily  Vital Signs:        Doppler Pressure: 90 Automatc BP: 101/77 (86)    HR: 78       SPO2: 98% on RA       Weight: 252.4 lb w/o eqt  Last weight:257.2 lb  VAD Indication: Destination Therapy - due to limited social support. Pt discussed with Dr. Mosetta Pigeon at [facility] prior to LVAD implant.  LVAD assessment:      Speed: 5300 RPM      Flow: 4.4       Power: 4.0 w         PI: 4.6       Alarms: several low voltage advisories Events: >80 daily Hct: 20       Fixed speed: 5300 Low speed limit: 5000  Primary Controller: Replace back up battery in 19 months. Back up controller: Replace back up battery in 63months.  I reviewed the LVAD parameters from today and compared the results to the patient's prior recorded data. LVAD interrogation was NEGATIVE for significant power changes, NEGATIVE for clinical alarms and STABLE for PI events/speed drops. No programming changes were made and pump is functioning within specified parameters. Pt is performing daily controller and system monitor self tests along with completing weekly and monthly maintenance for LVAD equipment.  LVAD equipment check completed and is in good working order. Back-up equipment present.   Charged back up  battery in back up controller.   Annual maintenance completed on patient's home equipment per [employer] today, 07/23/18.  Exit Site Care: Gene is doing weekly dressing change on Thursdays. Existing VAD dressing removed and site care performed using sterile technique. Sorbaview dressing and bio patch wet. Drive line exit site cleaned with Chlora prep applicators x 2, allowed to dry, and gauze dressing applied. Exit site healed and incorporated, the velour is fully implanted at exit site. Small amount of dark bloody drainage noted with no tenderness in and around drive line. No redness, tenderness, foul odor or rash noted. Drive line anchor re-applied. Pt denies fever or chills. Pt instructed to switch to daily dressing kits and change as needed if it becomes wet with sweat. Pt verbalized understanding of same. Provided pt with 6 daily and 6 weekly kits along with 5 anchors.   Device:  N/A  BP & Labs:  Doppler 90- is reflecting MAP  Hgb 14.7 - No S/S of bleeding. Specifically denies melena/BRBPR or nosebleeds.  LDH 312 - and is within established baseline of 200 - 400. Denies tea-colored urine. No power elevations noted on interrogation.   Patient Instructions:  1. No change in medications. 2. Return to clinic in 2 months.   Zada Girt RN VAD Coordinator  Office: [phone]  24/7 Pager: [phone]

## 2018-07-25 NOTE — Progress Notes (Signed)
LVAD Clinic Note   Primary Cardiologist: Dr. Haroldine Laws   HPI: Benjamin Sherman is a 48 y.o. 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 ECHO showed severely reduced EF. 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 Placed on steroids without benefit.  Admitted 11/18 after fall/syncopal episode resulting in bifrontal SAH. Neurosurgery consulted.   Admitted 4/19 with cardiogenic shock in setting of AFL. Supported with inotropes and underwent DC-CV but had persistent shock. After numerous discussions about concern for adequate social support underwent placement of HM-3 LVAD on 05/15/17. D/c home 06/04/17  Today he returns for VAD follow up. Continues to do very well. Says he can do anything he wants and is remaining active but is also being very careful with COVID restrictions. Denies exertional SOB or other HF s/s. Denies orthopnea or PND. No fevers, chills or problems with driveline. No bleeding, melena or neuro symptoms. No VAD alarms. Taking all meds as prescribed x he ran out of allopurinol. Gene doing dressing changes.    VAD Indication:  Destination Therapy - due to limited social support. Pt discussed with Dr. Mosetta Pigeon at [facility] prior to LVAD implant.  LVAD assessment:  Speed: 5300 RPM  Flow: 4.4  Power: 4.0 w  PI: 4.6  Alarms: several low voltage advisories  Events: >80 daily  Hct: 20   Fixed speed: 5300  Low speed limit: 5000  Primary Controller: Replace back up battery in 19 months.  Back up controller: Replace back up battery in 20 months.   I reviewed the LVAD parameters from today and compared the results to the patient's prior recorded data. LVAD interrogation was NEGATIVE for significant power changes, NEGATIVE for clinical alarms and STABLE for PI events/speed drops. No programming changes were made and pump is  functioning within specified parameters. Pt is performing daily controller and system monitor self tests along with completing weekly and monthly maintenance for LVAD equipment.  LVAD equipment check completed and is in good working order. Back-up equipment present.   Charged back up battery in back up controller.    Review of systems complete and found to be negative unless listed in HPI.   Past Medical History:  Diagnosis Date  . CHF (congestive heart failure) ([place])   . Diabetes mellitus without complication ([place])     Current Outpatient Medications  Medication Sig Dispense Refill  . amiodarone (PACERONE) 100 MG tablet Take 1 tablet (100 mg total) by mouth daily. 90 tablet 3  . amLODipine (NORVASC) 5 MG tablet Take 1 tablet (5 mg total) by mouth daily. 90 tablet 3  . Blood Glucose Monitoring Suppl (TRUE METRIX METER) DEVI 1 each by Does not apply route daily with breakfast. 1 Device 0  . ferrous sulfate 325 (65 FE) MG EC tablet Take 325 mg by mouth 2 (two) times daily.    Marland Kitchen glipiZIDE (GLUCOTROL) 5 MG tablet Take 0.5 tablets (2.5 mg total) by mouth 2 (two) times daily before a meal. MUST MAKE APPT FOR FURTHER REFILLS 30 tablet 1  . glucose blood (TRUE METRIX BLOOD GLUCOSE TEST) test strip Use daily before breakfast 30 each 12  . pantoprazole (PROTONIX) 40 MG tablet Take 1 tablet (40 mg total) by mouth daily. 30 tablet 5  . sacubitril-valsartan (ENTRESTO) 49-51 MG Take 1 tablet by mouth 2 (two) times daily. 60 tablet 4  . TRUEPLUS LANCETS 28G MISC  1 each by Does not apply route daily. 30 each 12  . warfarin (COUMADIN) 6 MG tablet Take 1/2 tablet (3 mg) daily except 1 tablet (6 mg) on Monday and Friday 90 tablet 3  . allopurinol (ZYLOPRIM) 100 MG tablet Take 1 tablet (100 mg total) by mouth daily. (Patient not taking: Reported on 06/13/2018) 90 tablet 3  . aspirin 81 MG EC tablet Take 1 tablet (81 mg total) by mouth daily. 90 tablet 3   No current facility-administered medications for this  encounter.    Allergies  Allergen Reactions  . Bee Venom     UNSPECIFIED REACTION    Social History   Socioeconomic History  . Marital status: Single    Spouse name: Not on file  . Number of children: Not on file  . Years of education: Not on file  . Highest education level: Not on file  Occupational History  . Occupation: Diplomatic Services operational officersecurity  Social Needs  . Financial resource strain: Not on file  . Food insecurity    Worry: Not on file    Inability: Not on file  . Transportation needs    Medical: Not on file    Non-medical: Not on file  Tobacco Use  . Smoking status: Former Smoker    Types: Cigars    Quit date: 11/16/2016    Years since quitting: 1.6  . Smokeless tobacco: Never Used  . Tobacco comment: 6 cigars per wk  Substance and Sexual Activity  . Alcohol use: Yes    Alcohol/week: 14.0 standard drinks    Types: 12 Cans of beer, 2 Shots of liquor per week    Comment: occ  . Drug use: No  . Sexual activity: Not on file  Lifestyle  . Physical activity    Days per week: Not on file    Minutes per session: Not on file  . Stress: Not on file  Relationships  . Social Musicianconnections    Talks on phone: Not on file    Gets together: Not on file    Attends religious service: Not on file    Active member of club or organization: Not on file    Attends meetings of clubs or organizations: Not on file    Relationship status: Not on file  . Intimate partner violence    Fear of current or ex partner: Not on file    Emotionally abused: Not on file    Physically abused: Not on file    Forced sexual activity: Not on file  Other Topics Concern  . Not on file  Social History Narrative  . Not on file    Family History  Problem Relation Age of Onset  . CAD Father    Vitals:   07/23/18 1113 07/23/18 1114  BP: (!) 90/0 101/77  Pulse:  78  Temp:  98.7 F (37.1 C)  SpO2:  98%  Weight:  114.5 kg (252 lb 6.4 oz)  Height:  6' (1.829 m)    Wt Readings from Last 3 Encounters:   07/23/18 114.5 kg (252 lb 6.4 oz)  05/30/18 116.7 kg (257 lb 3.2 oz)  05/23/18 115.7 kg (255 lb)      Vital Signs:   Doppler Pressure: 90  Automatc BP: 101/77 (86)  HR: 78  SPO2: 98% on RA  Weight: 252.4 lb w/o eqt  Last weight:257.2 lb   Physical Exam: General:  NAD.  HEENT: normal x for edentulous Neck: supple. JVP not elevated.  Carotids 2+ bilat; no  bruits. No lymphadenopathy or thryomegaly appreciated. Cor: LVAD hum.  Lungs: Clear. Abdomen: soft, nontender, non-distended. No hepatosplenomegaly. No bruits or masses. Good bowel sounds. Driveline site clean. Anchor in place.  Extremities: no cyanosis, clubbing, rash. Warm no edema  Neuro: alert & oriented x 3. No focal deficits. Moves all 4 without problem   ASSESSMENT & PLAN:  1. Chronic Combined Systolic/Diastolic Heart Failure. NICM  - ECHO 4/19 EF 20% CMRI EF 22%  - s/p HM-3 LVAD on 05/15/17 - Doing very well. NYHA I on VAD support - Volume status looks good off lasix.   2. VAD management Driveline with serous exudate expressed. CX exudate, check CBC, and start keflex 250 mg three times a day for 7 days.  - VAD interrogated personally. Parameters stable. Still with about 40 PI events daily despite better BP control - LDH 312 INR 2.2 Discussed dosing with PharmD personally. - Continue ASA 81 mg daily + coumadin. INR goal 2-2.5. - Driveline site looks ok. He is sweating a lot with activity and we told him that when he sweats should change dressing daily.  3. AFL - Maintaining NSR - Drop amio to 100 mg daily.   4. HTN -Blood pressure well controlled. Continue current regimen.  5. Hypothyroidism - TSH mildly elevated 06/2017 at 5.4, but T3 and T4 normal - Continue to follow on amio.    6. DMII - Per PCP  7. CKD 3 -  Creatinine continues to trend up now 1.69 -> 1.99 - suspect volume depletion.  - recheck 3-4 weeks  Total time spent 35 minutes. Over half that time spent discussing above.    [name], MD 07/25/18

## 2018-07-25 NOTE — Addendum Note (Signed)
Encounter addended by: Jolaine Artist, MD on: 07/25/2018 9:27 PM  Actions taken: Level of Service modified, Visit diagnoses modified, Charge Capture section accepted, Clinical Note Signed

## 2018-07-26 ENCOUNTER — Other Ambulatory Visit: Payer: Self-pay | Admitting: Internal Medicine

## 2018-07-26 DIAGNOSIS — Z95811 Presence of heart assist device: Secondary | ICD-10-CM

## 2018-07-31 ENCOUNTER — Other Ambulatory Visit (HOSPITAL_COMMUNITY): Payer: Self-pay | Admitting: *Deleted

## 2018-07-31 DIAGNOSIS — Z95811 Presence of heart assist device: Secondary | ICD-10-CM

## 2018-07-31 DIAGNOSIS — Z7901 Long term (current) use of anticoagulants: Secondary | ICD-10-CM

## 2018-08-06 ENCOUNTER — Ambulatory Visit (HOSPITAL_COMMUNITY): Payer: Self-pay | Admitting: Pharmacist

## 2018-08-06 ENCOUNTER — Other Ambulatory Visit (HOSPITAL_COMMUNITY): Payer: Medicaid Other

## 2018-08-06 ENCOUNTER — Other Ambulatory Visit: Payer: Self-pay

## 2018-08-06 ENCOUNTER — Ambulatory Visit (HOSPITAL_COMMUNITY)
Admission: RE | Admit: 2018-08-06 | Discharge: 2018-08-06 | Disposition: A | Discharge status: Home / Self Care | Payer: Medicaid Other | Source: Ambulatory Visit | Attending: Cardiology | Admitting: Cardiology

## 2018-08-06 DIAGNOSIS — Z95811 Presence of heart assist device: Secondary | ICD-10-CM

## 2018-08-06 DIAGNOSIS — Z7901 Long term (current) use of anticoagulants: Secondary | ICD-10-CM | POA: Insufficient documentation

## 2018-08-06 LAB — BASIC METABOLIC PANEL
Anion gap: 11 (ref 5–15)
BUN: 20 mg/dL (ref 6–20)
CO2: 24 mmol/L (ref 22–32)
Calcium: 8.7 mg/dL — ABNORMAL LOW (ref 8.9–10.3)
Chloride: 103 mmol/L (ref 98–111)
Creatinine, Ser: 1.94 mg/dL — ABNORMAL HIGH (ref 0.61–1.24)
GFR calc Af Amer: 45 mL/min — ABNORMAL LOW (ref >60)
GFR calc non Af Amer: 39 mL/min — ABNORMAL LOW (ref >60)
Glucose, Bld: 202 mg/dL — ABNORMAL HIGH (ref 70–99)
Potassium: 5 mmol/L (ref 3.5–5.1)
Sodium: 138 mmol/L (ref 135–145)

## 2018-08-06 LAB — PROTIME-INR
INR: 1.9 — ABNORMAL HIGH (ref 0.8–1.2)
Prothrombin Time: 21.5 seconds — ABNORMAL HIGH (ref 11.4–15.2)

## 2018-08-09 MED FILL — glipiZIDE 5 MG TABS: 5 | 30 days supply | Qty: 15 | Fill #2

## 2018-08-16 ENCOUNTER — Other Ambulatory Visit (HOSPITAL_COMMUNITY): Payer: Self-pay | Admitting: *Deleted

## 2018-08-16 DIAGNOSIS — Z95811 Presence of heart assist device: Secondary | ICD-10-CM

## 2018-08-16 DIAGNOSIS — Z7901 Long term (current) use of anticoagulants: Secondary | ICD-10-CM

## 2018-08-20 ENCOUNTER — Other Ambulatory Visit: Payer: Self-pay

## 2018-08-20 ENCOUNTER — Ambulatory Visit (HOSPITAL_COMMUNITY): Payer: Self-pay | Admitting: Pharmacist

## 2018-08-20 ENCOUNTER — Ambulatory Visit (HOSPITAL_COMMUNITY)
Admission: RE | Admit: 2018-08-20 | Discharge: 2018-08-20 | Disposition: A | Discharge status: Home / Self Care | Payer: Medicaid Other | Source: Ambulatory Visit | Attending: Cardiology | Admitting: Cardiology

## 2018-08-20 DIAGNOSIS — Z7901 Long term (current) use of anticoagulants: Secondary | ICD-10-CM

## 2018-08-20 DIAGNOSIS — Z95811 Presence of heart assist device: Secondary | ICD-10-CM | POA: Insufficient documentation

## 2018-08-20 LAB — PROTIME-INR
INR: 1.8 — ABNORMAL HIGH (ref 0.8–1.2)
Prothrombin Time: 20.3 seconds — ABNORMAL HIGH (ref 11.4–15.2)

## 2018-08-30 ENCOUNTER — Other Ambulatory Visit (HOSPITAL_COMMUNITY): Payer: Self-pay | Admitting: *Deleted

## 2018-08-30 DIAGNOSIS — Z95811 Presence of heart assist device: Secondary | ICD-10-CM

## 2018-08-30 DIAGNOSIS — Z7901 Long term (current) use of anticoagulants: Secondary | ICD-10-CM

## 2018-09-03 ENCOUNTER — Ambulatory Visit (HOSPITAL_COMMUNITY): Payer: Self-pay | Admitting: Pharmacist

## 2018-09-03 ENCOUNTER — Telehealth: Payer: Self-pay | Admitting: General Practice

## 2018-09-03 ENCOUNTER — Ambulatory Visit (HOSPITAL_COMMUNITY)
Admission: RE | Admit: 2018-09-03 | Discharge: 2018-09-03 | Disposition: A | Discharge status: Home / Self Care | Payer: Medicaid Other | Source: Ambulatory Visit | Attending: Internal Medicine | Admitting: Internal Medicine

## 2018-09-03 ENCOUNTER — Other Ambulatory Visit: Payer: Self-pay

## 2018-09-03 DIAGNOSIS — Z7901 Long term (current) use of anticoagulants: Secondary | ICD-10-CM | POA: Insufficient documentation

## 2018-09-03 DIAGNOSIS — E1169 Type 2 diabetes mellitus with other specified complication: Secondary | ICD-10-CM

## 2018-09-03 DIAGNOSIS — Z5181 Encounter for therapeutic drug level monitoring: Secondary | ICD-10-CM | POA: Insufficient documentation

## 2018-09-03 DIAGNOSIS — Z95811 Presence of heart assist device: Secondary | ICD-10-CM | POA: Insufficient documentation

## 2018-09-03 LAB — PROTIME-INR
INR: 2.6 — ABNORMAL HIGH (ref 0.8–1.2)
Prothrombin Time: 27 seconds — ABNORMAL HIGH (ref 11.4–15.2)

## 2018-09-03 MED ORDER — GLIPIZIDE 5 MG PO TABS: 2.5000 mg | ORAL_TABLET | Freq: Two times a day (BID) | ORAL | 1 refills | Status: DC

## 2018-09-03 NOTE — Progress Notes (Signed)
LVAD INR 

## 2018-09-03 NOTE — Telephone Encounter (Signed)
1) Medication(s) Requested (by name): glipiZIDE (GLUCOTROL) 5 MG tablet [[id number]]   2) Pharmacy of Choice: [facility]   3) Special Requests: Pt has appt on 09/15  Approved medications will be sent to the pharmacy, we will reach out if there is an issue.  Requests made after 3pm may not be addressed until the following business day!  If a patient is unsure of the name of the medication(s) please note and ask patient to call back when they are able to provide all info, do not send to responsible party until all information is available!

## 2018-09-03 NOTE — Telephone Encounter (Signed)
Patient is out of medication and has an appointment on 10/01/2018 with you. Does patient need to wait until appointment for refills.

## 2018-09-03 NOTE — Telephone Encounter (Signed)
Refilled

## 2018-09-04 NOTE — Telephone Encounter (Signed)
Patient was called and there was no voicemail set up to leave a message. 

## 2018-09-11 ENCOUNTER — Other Ambulatory Visit (HOSPITAL_COMMUNITY): Payer: Self-pay | Admitting: *Deleted

## 2018-09-11 DIAGNOSIS — Z95811 Presence of heart assist device: Secondary | ICD-10-CM

## 2018-09-11 DIAGNOSIS — Z7901 Long term (current) use of anticoagulants: Secondary | ICD-10-CM

## 2018-09-16 ENCOUNTER — Other Ambulatory Visit: Payer: Self-pay | Admitting: Family Medicine

## 2018-09-16 DIAGNOSIS — E1169 Type 2 diabetes mellitus with other specified complication: Secondary | ICD-10-CM

## 2018-09-17 ENCOUNTER — Other Ambulatory Visit (HOSPITAL_COMMUNITY): Payer: Self-pay | Admitting: *Deleted

## 2018-09-17 ENCOUNTER — Encounter (HOSPITAL_COMMUNITY): Payer: Self-pay

## 2018-09-17 ENCOUNTER — Ambulatory Visit (HOSPITAL_COMMUNITY)
Admission: RE | Admit: 2018-09-17 | Discharge: 2018-09-17 | Disposition: A | Discharge status: Home / Self Care | Payer: Self-pay | Source: Ambulatory Visit | Attending: Cardiology | Admitting: Cardiology

## 2018-09-17 ENCOUNTER — Ambulatory Visit (HOSPITAL_COMMUNITY): Payer: Self-pay | Admitting: Pharmacist

## 2018-09-17 ENCOUNTER — Other Ambulatory Visit: Payer: Self-pay

## 2018-09-17 VITALS — BP 100/67 | HR 78 | Temp 98.3°F | Wt 253.0 lb

## 2018-09-17 DIAGNOSIS — Z95811 Presence of heart assist device: Secondary | ICD-10-CM

## 2018-09-17 DIAGNOSIS — E1122 Type 2 diabetes mellitus with diabetic chronic kidney disease: Secondary | ICD-10-CM | POA: Insufficient documentation

## 2018-09-17 DIAGNOSIS — I10 Essential (primary) hypertension: Secondary | ICD-10-CM

## 2018-09-17 DIAGNOSIS — Z91030 Bee allergy status: Secondary | ICD-10-CM | POA: Insufficient documentation

## 2018-09-17 DIAGNOSIS — I4892 Unspecified atrial flutter: Secondary | ICD-10-CM | POA: Insufficient documentation

## 2018-09-17 DIAGNOSIS — N183 Chronic kidney disease, stage 3 unspecified: Secondary | ICD-10-CM

## 2018-09-17 DIAGNOSIS — Z8249 Family history of ischemic heart disease and other diseases of the circulatory system: Secondary | ICD-10-CM | POA: Insufficient documentation

## 2018-09-17 DIAGNOSIS — Z7901 Long term (current) use of anticoagulants: Secondary | ICD-10-CM | POA: Insufficient documentation

## 2018-09-17 DIAGNOSIS — I428 Other cardiomyopathies: Secondary | ICD-10-CM | POA: Insufficient documentation

## 2018-09-17 DIAGNOSIS — Z87891 Personal history of nicotine dependence: Secondary | ICD-10-CM | POA: Insufficient documentation

## 2018-09-17 DIAGNOSIS — Z7982 Long term (current) use of aspirin: Secondary | ICD-10-CM | POA: Insufficient documentation

## 2018-09-17 DIAGNOSIS — I5022 Chronic systolic (congestive) heart failure: Secondary | ICD-10-CM

## 2018-09-17 DIAGNOSIS — Z7984 Long term (current) use of oral hypoglycemic drugs: Secondary | ICD-10-CM | POA: Insufficient documentation

## 2018-09-17 DIAGNOSIS — E039 Hypothyroidism, unspecified: Secondary | ICD-10-CM | POA: Insufficient documentation

## 2018-09-17 DIAGNOSIS — Z79899 Other long term (current) drug therapy: Secondary | ICD-10-CM | POA: Insufficient documentation

## 2018-09-17 DIAGNOSIS — I130 Hypertensive heart and chronic kidney disease with heart failure and stage 1 through stage 4 chronic kidney disease, or unspecified chronic kidney disease: Secondary | ICD-10-CM | POA: Insufficient documentation

## 2018-09-17 DIAGNOSIS — I5042 Chronic combined systolic (congestive) and diastolic (congestive) heart failure: Secondary | ICD-10-CM | POA: Insufficient documentation

## 2018-09-17 LAB — BASIC METABOLIC PANEL
Anion gap: 11 (ref 5–15)
BUN: 26 mg/dL — ABNORMAL HIGH (ref 6–20)
CO2: 24 mmol/L (ref 22–32)
Calcium: 9.1 mg/dL (ref 8.9–10.3)
Chloride: 102 mmol/L (ref 98–111)
Creatinine, Ser: 1.76 mg/dL — ABNORMAL HIGH (ref 0.61–1.24)
GFR calc Af Amer: 51 mL/min — ABNORMAL LOW (ref >60)
GFR calc non Af Amer: 44 mL/min — ABNORMAL LOW (ref >60)
Glucose, Bld: 110 mg/dL — ABNORMAL HIGH (ref 70–99)
Potassium: 4.5 mmol/L (ref 3.5–5.1)
Sodium: 137 mmol/L (ref 135–145)

## 2018-09-17 LAB — LACTATE DEHYDROGENASE: LDH: 327 U/L — ABNORMAL HIGH (ref 98–192)

## 2018-09-17 LAB — CBC
HCT: 46.2 % (ref 39.0–52.0)
Hemoglobin: 14.8 g/dL (ref 13.0–17.0)
MCH: 30 pg (ref 26.0–34.0)
MCHC: 32 g/dL (ref 30.0–36.0)
MCV: 93.7 fL (ref 80.0–100.0)
Platelets: 241 10*3/uL (ref 150–400)
RBC: 4.93 MIL/uL (ref 4.22–5.81)
RDW: 14.6 % (ref 11.5–15.5)
WBC: 7.2 10*3/uL (ref 4.0–10.5)
nRBC: 0 % (ref 0.0–0.2)

## 2018-09-17 LAB — PROTIME-INR
INR: 2.2 — ABNORMAL HIGH (ref 0.8–1.2)
Prothrombin Time: 23.9 seconds — ABNORMAL HIGH (ref 11.4–15.2)

## 2018-09-17 MED FILL — glipiZIDE 5 MG TABS: 5 | 30 days supply | Qty: 30 | Fill #0

## 2018-09-17 NOTE — Progress Notes (Signed)
LVAD INR 

## 2018-09-17 NOTE — Progress Notes (Signed)
Patient presents for 2 month follow up in [facility] today. Reports no problems with VAD equipment or concerns with drive line.   Pt says he has no limitations to daily activities. He has been golfing a few days a week. He is observing Covid precautions.   Pt is not taking allopurinol, but denies any gout symptoms.   Vital Signs:        Doppler Pressure: 84 Automatc BP: 100/67 (81)    HR: 78       SPO2: 98% on RA       Weight: 253.0 lb w/o eqt  Last weight:252.4 lb  VAD Indication: Destination Therapy - due to limited social support. Pt discussed with Dr. Mosetta Pigeon at [facility] prior to LVAD implant.  LVAD assessment:      Speed: 5300 RPM      Flow: 4.3       Power: 3.9 w         PI: 5.6       Alarms: few low voltage Events: 40-60 daily Hct: 20       Fixed speed: 5300 Low speed limit: 5000  Primary Controller: Replace back up battery in 17 months. Back up controller: Replace back up battery in 18 months.  I reviewed the LVAD parameters from today and compared the results to the patient's prior recorded data. LVAD interrogation was NEGATIVE for significant power changes, NEGATIVE for clinical alarms and STABLE for PI events/speed drops. No programming changes were made and pump is functioning within specified parameters. Pt is performing daily controller and system monitor self tests along with completing weekly and monthly maintenance for LVAD equipment.  LVAD equipment check completed and is in good working order. Back-up equipment present.   Charged back up battery in back up controller.   Annual maintenance completed on patient's home equipment per [employer] today, 07/23/18.  Exit Site Care: Gene is doing weekly dressing change on Thursdays. Existing VAD dressing removed and site care performed using sterile technique. Sorbaview dressing and bio patch wet. Drive line exit site cleaned with Chlora prep applicators x 2, allowed to dry, and gauze dressing applied. Exit site healed  and incorporated, the velour is fully implanted at exit site. No drainage, redness, tenderness, foul odor or rash noted. Drive line anchor re-applied. Pt denies fever or chills. Pt instructed to switch to daily dressing kits and change as needed if it becomes wet with sweat. He states he has been going through 3 anchors a week due to sweat.  Pt verbalized understanding of same. Provided pt with 8 daily kits along with 20 anchors.   Device:  N/A  BP & Labs:  Doppler 84 - is reflecting MAP  Hgb 14.8 - No S/S of bleeding. Specifically denies melena/BRBPR or nosebleeds.  LDH 327 - and is within established baseline of 200 - 400. Denies tea-colored urine. No power elevations noted on interrogation.   Patient Instructions:  1. No change in medications. 2. Return to clinic in 2 months.   Emerson Monte RN [place] Coordinator  Office: [phone]  24/7 Pager: [phone]

## 2018-09-17 NOTE — Patient Instructions (Signed)
1. No medication changes today 2. Lauren PharmD will be in touch regarding Coumadin dosing 3. Return to [place] clinic in 2 months for follow up

## 2018-09-23 NOTE — Progress Notes (Signed)
LVAD Clinic Note   Primary Cardiologist: Dr. Gala RomneyBensimhon   HPI: Benjamin HorsemanSteven Sherman is a 48 y.o. 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 ECHO showed severely reduced EF. 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 Placed on steroids without benefit.  Admitted 11/18 after fall/syncopal episode resulting in bifrontal SAH. Neurosurgery consulted.   Admitted 4/19 with cardiogenic shock in setting of AFL. Supported with inotropes and underwent DC-CV but had persistent shock. After numerous discussions about concern for adequate social support underwent placement of HM-3 LVAD on 05/15/17. D/c home 06/04/17  Today he returns for VAD follow up. Continues to do very well. Says he can do anything he wants and is remaining active but is also being very careful with COVID restrictions. Playing golf several times per week without difficulty. Tries to drink as much as he can to stay hydrated. Changes dressing daily when he sweats a lot. Denies orthopnea or PND. No fevers, chills or problems with driveline. No bleeding, melena or neuro symptoms. No VAD alarms. Taking all meds as prescribed x allopurinol.    VAD Indication:  Destination Therapy - due to limited social support. [facility] LVAD [facility].  LVAD assessment:  Speed: 5300 RPM  Flow: 4.3  Power: 3.9 w  PI: 5.6  Alarms: few low voltage  Events: 40-60 daily  Hct: 20   Fixed speed: 5300  Low speed limit: 5000  Primary Controller: Replace back up battery in 17 months.  Back up controller: Replace back up battery in 18 months.   I reviewed the LVAD parameters from today and compared the results to the patient's prior recorded data. LVAD interrogation was NEGATIVE for significant power changes, NEGATIVE for clinical alarms and STABLE for PI  events/speed drops. No programming changes were made and pump is functioning within specified parameters. Pt is performing daily controller and system monitor self tests along with completing weekly and monthly maintenance for LVAD equipment.  LVAD equipment check completed and is in good working order. Back-up equipment present.    Charged back up battery in back up controller.    Review of systems complete and found to be negative unless listed in HPI.   Past Medical History:  Diagnosis Date  . CHF (congestive heart failure) (HCC)   . Diabetes mellitus without complication (HCC)     Current Outpatient Medications  Medication Sig Dispense Refill  . amiodarone (PACERONE) 100 MG tablet Take 1 tablet (100 mg total) by mouth daily. 90 tablet 3  . amLODipine (NORVASC) 5 MG tablet Take 1 tablet (5 mg total) by mouth daily. 90 tablet 3  . aspirin 81 MG EC tablet Take 1 tablet (81 mg total) by mouth daily. 90 tablet 3  . Blood Glucose Monitoring Suppl (TRUE METRIX METER) DEVI 1 each by Does not apply route daily with breakfast. 1 Device 0  . ferrous sulfate 325 (65 FE) MG EC tablet Take 325 mg by mouth 2 (two) times daily.    Marland Kitchen. glipiZIDE (GLUCOTROL) 5 MG tablet Take 0.5 tablets (2.5 mg total) by mouth 2 (two) times daily before a meal. Must keep upcoming appointment. 30 tablet 0  . glucose blood (TRUE METRIX BLOOD GLUCOSE TEST) test strip Use daily before breakfast 30 each 12  . pantoprazole (PROTONIX) 40 MG tablet Take 1 tablet (40 mg total) by  mouth daily. 30 tablet 5  . sacubitril-valsartan (ENTRESTO) 49-51 MG Take 1 tablet by mouth 2 (two) times daily. 60 tablet 4  . TRUEPLUS LANCETS 28G MISC 1 each by Does not apply route daily. 30 each 12  . warfarin (COUMADIN) 6 MG tablet Take 1/2 tablet (3 mg) daily except 1 tablet (6 mg) on Monday and Friday 90 tablet 3  . allopurinol (ZYLOPRIM) 100 MG tablet Take 1 tablet (100 mg total) by mouth daily. (Patient not taking: Reported on 06/13/2018) 90  tablet 3   No current facility-administered medications for this encounter.    Allergies  Allergen Reactions  . Bee Venom     UNSPECIFIED REACTION    Social History   Socioeconomic History  . Marital status: Single    Spouse name: Not on file  . Number of children: Not on file  . Years of education: Not on file  . Highest education level: Not on file  Occupational History  . Occupation: Diplomatic Services operational officer Needs  . Financial resource strain: Not on file  . Food insecurity    Worry: Not on file    Inability: Not on file  . Transportation needs    Medical: Not on file    Non-medical: Not on file  Tobacco Use  . Smoking status: Former Smoker    Types: Cigars    Quit date: 11/16/2016    Years since quitting: 1.8  . Smokeless tobacco: Never Used  . Tobacco comment: 6 cigars per wk  Substance and Sexual Activity  . Alcohol use: Yes    Alcohol/week: 14.0 standard drinks    Types: 12 Cans of beer, 2 Shots of liquor per week    Comment: occ  . Drug use: No  . Sexual activity: Not on file  Lifestyle  . Physical activity    Days per week: Not on file    Minutes per session: Not on file  . Stress: Not on file  Relationships  . Social Musician on phone: Not on file    Gets together: Not on file    Attends religious service: Not on file    Active member of club or organization: Not on file    Attends meetings of clubs or organizations: Not on file    Relationship status: Not on file  . Intimate partner violence    Fear of current or ex partner: Not on file    Emotionally abused: Not on file    Physically abused: Not on file    Forced sexual activity: Not on file  Other Topics Concern  . Not on file  Social History Narrative  . Not on file    Family History  Problem Relation Age of Onset  . CAD Father    Vitals:   09/17/18 1034 09/17/18 1035  BP: (!) 84/0 100/67  Pulse: 78   Temp: 98.3 F (36.8 C)   SpO2: 98%   Weight: 114.8 kg (253 lb)     Wt  Readings from Last 3 Encounters:  09/17/18 114.8 kg (253 lb)  07/23/18 114.5 kg (252 lb 6.4 oz)  05/30/18 116.7 kg (257 lb 3.2 oz)       Vital Signs:  Doppler Pressure: 84  Automatc BP: 100/67 (81)  HR: 78  SPO2: 98% on RA  Weight: 253.0 lb w/o eqt  Last weight:252.4 lb    Physical Exam: General:  NAD.  HEENT: normal  Neck: supple. JVP not elevated.  Carotids 2+ bilat;  no bruits. No lymphadenopathy or thryomegaly appreciated. Cor: LVAD hum.  Lungs: Clear. Abdomen: soft, nontender, non-distended. No hepatosplenomegaly. No bruits or masses. Good bowel sounds. Driveline site clean. Anchor in place.  Extremities: no cyanosis, clubbing, rash. Warm no edema  Neuro: alert & oriented x 3. No focal deficits. Moves all 4 without problem   ASSESSMENT & PLAN:  1. Chronic Combined Systolic/Diastolic Heart Failure. NICM  - ECHO 4/19 EF 20% CMRI EF 22%  - s/p HM-3 LVAD on 05/15/17 - Doing very well. Remains NYHA I on VAD support. Playing golf several times per week without problem - Volume status looks good off lasix. Reminded to keep up with fluid intake especially in the ehat   2. VAD management Driveline with serous exudate expressed. CX exudate, check CBC, and start keflex 250 mg three times a day for 7 days.  - VAD interrogated personally. Parameters stable. - Still with about 40-50 PI events daily despite better BP control. Continue to remind him to keep up with fluid intake - LDH 327 INR 2.2 Discussed dosing with PharmD personally. - Continue ASA 81 mg daily + coumadin. INR goal 2-2.5. - Driveline site looks ok. He is sweating a lot with activity and we reminded him again that when he sweats should change dressing daily.  3. AFL - Maintaining NSR - Drop amio to 100 mg daily.   4. HTN -Blood pressure well controlled. Continue current regimen.  5. Hypothyroidism - TSH mildly elevated 06/2017 at 5.4, but T3 and T4 normal - Continue to follow on amio.    6. DMII - Per PCP   7. CKD 3 - Creatinine now back down 1.69 -> 1.99 -> 1.76 - Continue fluid intake  Total time spent 35 minutes. Over half that time spent discussing above.     Glori Bickers, MD 09/23/18

## 2018-09-27 ENCOUNTER — Other Ambulatory Visit (HOSPITAL_COMMUNITY): Payer: Self-pay | Admitting: Unknown Physician Specialty

## 2018-09-27 DIAGNOSIS — Z95811 Presence of heart assist device: Secondary | ICD-10-CM

## 2018-09-27 DIAGNOSIS — Z7901 Long term (current) use of anticoagulants: Secondary | ICD-10-CM

## 2018-10-01 ENCOUNTER — Ambulatory Visit (HOSPITAL_COMMUNITY): Payer: Self-pay | Admitting: Pharmacist

## 2018-10-01 ENCOUNTER — Ambulatory Visit (HOSPITAL_COMMUNITY)
Admission: RE | Admit: 2018-10-01 | Discharge: 2018-10-01 | Disposition: A | Discharge status: Home / Self Care | Payer: Medicaid Other | Source: Ambulatory Visit | Attending: Cardiology | Admitting: Cardiology

## 2018-10-01 ENCOUNTER — Ambulatory Visit: Payer: Medicaid Other | Admitting: Family Medicine

## 2018-10-01 ENCOUNTER — Other Ambulatory Visit: Payer: Self-pay

## 2018-10-01 DIAGNOSIS — Z95811 Presence of heart assist device: Secondary | ICD-10-CM

## 2018-10-01 DIAGNOSIS — Z7901 Long term (current) use of anticoagulants: Secondary | ICD-10-CM | POA: Insufficient documentation

## 2018-10-01 LAB — PROTIME-INR
INR: 2.2 — ABNORMAL HIGH (ref 0.8–1.2)
Prothrombin Time: 24.5 seconds — ABNORMAL HIGH (ref 11.4–15.2)

## 2018-10-01 NOTE — Progress Notes (Signed)
LVAD INR 

## 2018-10-03 ENCOUNTER — Other Ambulatory Visit: Payer: Self-pay

## 2018-10-03 ENCOUNTER — Ambulatory Visit: Payer: Self-pay | Attending: Family Medicine | Admitting: Physician Assistant

## 2018-10-03 VITALS — BP 102/68 | HR 88 | Temp 98.4°F | Ht 72.0 in | Wt 256.0 lb

## 2018-10-03 DIAGNOSIS — I5020 Unspecified systolic (congestive) heart failure: Secondary | ICD-10-CM

## 2018-10-03 DIAGNOSIS — N183 Chronic kidney disease, stage 3 unspecified: Secondary | ICD-10-CM

## 2018-10-03 DIAGNOSIS — E1169 Type 2 diabetes mellitus with other specified complication: Secondary | ICD-10-CM

## 2018-10-03 DIAGNOSIS — Z95811 Presence of heart assist device: Secondary | ICD-10-CM

## 2018-10-03 DIAGNOSIS — I5022 Chronic systolic (congestive) heart failure: Secondary | ICD-10-CM

## 2018-10-03 DIAGNOSIS — Z7901 Long term (current) use of anticoagulants: Secondary | ICD-10-CM

## 2018-10-03 LAB — POCT GLYCOSYLATED HEMOGLOBIN (HGB A1C): Hemoglobin A1C: 6 % — AB (ref 4.0–5.6)

## 2018-10-03 LAB — GLUCOSE, POCT (MANUAL RESULT ENTRY): POC Glucose: 105 mg/dl — AB (ref 70–99)

## 2018-10-03 MED ORDER — AMLODIPINE BESYLATE 5 MG PO TABS: 5.0000 mg | ORAL_TABLET | Freq: Every day | ORAL | 3 refills | Status: DC

## 2018-10-03 MED ORDER — GLIPIZIDE 5 MG PO TABS: 2.5000 mg | ORAL_TABLET | Freq: Two times a day (BID) | ORAL | 0 refills | Status: DC

## 2018-10-03 MED ORDER — FERROUS SULFATE 325 (65 FE) MG PO TBEC: 325.0000 mg | DELAYED_RELEASE_TABLET | Freq: Two times a day (BID) | ORAL | 1 refills | Status: DC

## 2018-10-03 MED ORDER — ASPIRIN 81 MG PO TBEC: 81.0000 mg | DELAYED_RELEASE_TABLET | Freq: Every day | ORAL | 3 refills | Status: AC

## 2018-10-03 MED ORDER — PANTOPRAZOLE SODIUM 40 MG PO TBEC: 40.0000 mg | DELAYED_RELEASE_TABLET | Freq: Every day | ORAL | 5 refills | Status: DC

## 2018-10-03 MED ORDER — ENTRESTO 49-51 MG PO TABS: 1.0000 | ORAL_TABLET | Freq: Two times a day (BID) | ORAL | 4 refills | Status: DC

## 2018-10-03 MED ORDER — AMIODARONE HCL 100 MG PO TABS: 100.0000 mg | ORAL_TABLET | Freq: Every day | ORAL | 3 refills | Status: DC

## 2018-10-03 MED FILL — ?PANTOPRAZOLE SODI DR 40MGT: 40 | 30 days supply | Qty: 30 | Fill #0

## 2018-10-03 MED FILL — FERROUS SULFATE 325 MG TAB: 325 (65 FE) | 30 days supply | Qty: 60 | Fill #0

## 2018-10-03 MED FILL — ?AMLODIPINE BESYLATE 5MG TA: 5 | 30 days supply | Qty: 30 | Fill #0

## 2018-10-03 NOTE — Progress Notes (Signed)
Patient ID: Benjamin HorsemanSteven Raisch, male   DOB: 08/11/1970, 48 y.o.   MRN: [id number]   Benjamin HorsemanSteven Odriscoll, is a 48 y.o. male  [id number]  [id number]  DOB - 08/11/1970  Subjective:  Chief Complaint and HPI: Benjamin Sherman is a 48 y.o. male here today to establish care due to having no PCP and complex medical history.  Today he has no complaints.  He has frequent bloodwork but no recent A1C.  Has implanted LVAD.  Denies CP/HA/SOB.  Feels great.  Active but careful due to Covid risk.  Performs all ADL and considers himself to have good energy.  Patient Active Problem List   Diagnosis Date Noted  . Fever and chills 05/29/2017  . LVAD (left ventricular assist device) present (HCC) 05/15/2017  . Advance care planning   . Goals of care, counseling/discussion   . Palliative care encounter   . PAF (paroxysmal atrial fibrillation) (HCC)   . Fluid overload 04/21/2017  . Subarachnoid hemorrhage (HCC)   . Pneumothorax on right   . Chronic systolic CHF (congestive heart failure) (HCC)   . CKD (chronic kidney disease), stage III (HCC)   . Diabetes mellitus type 2, uncontrolled, with complications (HCC)   . Traumatic subarachnoid hemorrhage with loss of consciousness of 30 minutes or less (HCC)   . Anticoagulated   . Hyperkalemia   . Traumatic subarachnoid hematoma with loss of consciousness (HCC) 12/06/2016  . Hypothyroid 11/28/2016  . CKD (chronic kidney disease), stage II 11/28/2016  . DM II (diabetes mellitus, type II), controlled (HCC) 11/28/2016  . LV (left ventricular) mural thrombus without MI 11/28/2016  . CHF (congestive heart failure) (HCC) 11/06/2016     ROS:   Constitutional:  No f/c, No night sweats, No unexplained weight loss.  Reviewed BP in Epic and although a little on the low side, this is typical for him and he is asymptomatic EENT:  No vision changes, No blurry vision, No hearing changes. No mouth, throat, or ear problems.  Respiratory: No cough, No SOB Cardiac: No CP, no  palpitations GI:  No abd pain, No N/V/D. GU: No Urinary s/sx Musculoskeletal: No joint pain Neuro: No headache, no dizziness, no motor weakness.  Skin: No rash Endocrine:  No polydipsia. No polyuria.  Psych: Denies SI/HI  No problems updated.  ALLERGIES: Allergies  Allergen Reactions  . Bee Venom     UNSPECIFIED REACTION     PAST MEDICAL HISTORY: Past Medical History:  Diagnosis Date  . CHF (congestive heart failure) (HCC)   . Diabetes mellitus without complication (HCC)     MEDICATIONS AT HOME: Prior to Admission medications   Medication Sig Start Date End Date Taking? Authorizing Provider  amiodarone (PACERONE) 100 MG tablet Take 1 tablet (100 mg total) by mouth daily. 10/03/18  Yes Georgian CoMcClung, Angela M, PA-C  amLODipine (NORVASC) 5 MG tablet Take 1 tablet (5 mg total) by mouth daily. 10/03/18  Yes Anders SimmondsMcClung, Angela M, PA-C  aspirin 81 MG EC tablet Take 1 tablet (81 mg total) by mouth daily. 10/03/18  Yes Anders SimmondsMcClung, Angela M, PA-C  ferrous sulfate 325 (65 FE) MG EC tablet Take 1 tablet (325 mg total) by mouth 2 (two) times daily. 10/03/18  Yes McClung, Angela M, PA-C  glipiZIDE (GLUCOTROL) 5 MG tablet Take 0.5 tablets (2.5 mg total) by mouth 2 (two) times daily before a meal. Must keep upcoming appointment. 10/03/18  Yes Georgian CoMcClung, Angela M, PA-C  pantoprazole (PROTONIX) 40 MG tablet Take 1 tablet (40 mg total) by mouth daily. 10/03/18  Yes Argentina Donovan, PA-C  sacubitril-valsartan (ENTRESTO) 49-51 MG Take 1 tablet by mouth 2 (two) times daily. 10/03/18  Yes [name] M, PA-C  warfarin (COUMADIN) 6 MG tablet Take 1/2 tablet (3 mg) daily except 1 tablet (6 mg) on Monday and Friday 05/30/18  Yes Bensimhon, Shaune Pascal, MD  Blood Glucose Monitoring Suppl (TRUE METRIX METER) DEVI 1 each by Does not apply route daily with breakfast. 02/19/18   Charlott Rakes, MD  glucose blood (TRUE METRIX BLOOD GLUCOSE TEST) test strip Use daily before breakfast 02/19/18   Charlott Rakes, MD  TRUEPLUS LANCETS  28G MISC 1 each by Does not apply route daily. 02/19/18   Charlott Rakes, MD  gabapentin (NEURONTIN) 300 MG capsule TAKE 1 CAPSULE (300 MG TOTAL) BY MOUTH DAILY. 04/23/18 07/23/18  Bensimhon, Shaune Pascal, MD     Objective:  EXAM:   Vitals:   10/03/18 0916 10/03/18 0928  BP: (!) 83/58 102/68  Pulse: 88   Temp: 98.4 F (36.9 C)   TempSrc: Oral   SpO2: 97%   Weight: 256 lb (116.1 kg)   Height: 6' (1.829 m)     General appearance : A&OX3. NAD. Non-toxic-appearing HEENT: Atraumatic and Normocephalic.  PERRLA. EOM intact.  Neck: supple, no JVD. No cervical lymphadenopathy. No thyromegaly Chest/Lungs:  Breathing-non-labored, Good air entry bilaterally, breath sounds normal without rales, rhonchi, or wheezing  CVS:  audible LVAD hum Extremities: Bilateral Lower Ext shows no edema, both legs are warm to touch with = pulse throughout Neurology:  CN II-XII grossly intact, Non focal.   Psych:  TP linear. J/I WNL. Normal speech. Appropriate eye contact and affect.  Skin:  No Rash  Data Review Lab Results  Component Value Date   HGBA1C 6.0 (A) 10/03/2018   HGBA1C 7.4 (A) 02/19/2018   HGBA1C 7.7 (H) 05/08/2017     Assessment & Plan   1. Type 2 diabetes mellitus with other specified complication, without long-term current use of insulin (HCC) Improving control-continue current regimen and work on diabetic diet - Glucose (CBG) - POCT glycosylated hemoglobin (Hb A1C) - glipiZIDE (GLUCOTROL) 5 MG tablet; Take 0.5 tablets (2.5 mg total) by mouth 2 (two) times daily before a meal. Must keep upcoming appointment.  Dispense: 30 tablet; Refill: 0  2. On warfarin therapy - amiodarone (PACERONE) 100 MG tablet; Take 1 tablet (100 mg total) by mouth daily.  Dispense: 90 tablet; Refill: 3 - amLODipine (NORVASC) 5 MG tablet; Take 1 tablet (5 mg total) by mouth daily.  Dispense: 90 tablet; Refill: 3 - aspirin 81 MG EC tablet; Take 1 tablet (81 mg total) by mouth daily.  Dispense: 90 tablet; Refill: 3   3. Chronic systolic CHF (congestive heart failure) (HCC) Stable continue f/up cardiology - amiodarone (PACERONE) 100 MG tablet; Take 1 tablet (100 mg total) by mouth daily.  Dispense: 90 tablet; Refill: 3 - amLODipine (NORVASC) 5 MG tablet; Take 1 tablet (5 mg total) by mouth daily.  Dispense: 90 tablet; Refill: 3  4. CKD (chronic kidney disease), stage III (HCC) - amiodarone (PACERONE) 100 MG tablet; Take 1 tablet (100 mg total) by mouth daily.  Dispense: 90 tablet; Refill: 3 - amLODipine (NORVASC) 5 MG tablet; Take 1 tablet (5 mg total) by mouth daily.  Dispense: 90 tablet; Refill: 3  5. Presence of left ventricular assist device (LVAD) (HCC) - amiodarone (PACERONE) 100 MG tablet; Take 1 tablet (100 mg total) by mouth daily.  Dispense: 90 tablet; Refill: 3 - aspirin 81 MG EC tablet;  Take 1 tablet (81 mg total) by mouth daily.  Dispense: 90 tablet; Refill: 3 - pantoprazole (PROTONIX) 40 MG tablet; Take 1 tablet (40 mg total) by mouth daily.  Dispense: 30 tablet; Refill: 5  6. Systolic congestive heart failure, unspecified HF chronicity (HCC) - amiodarone (PACERONE) 100 MG tablet; Take 1 tablet (100 mg total) by mouth daily.  Dispense: 90 tablet; Refill: 3 - aspirin 81 MG EC tablet; Take 1 tablet (81 mg total) by mouth daily.  Dispense: 90 tablet; Refill: 3  7. LVAD (left ventricular assist device) present (HCC) - sacubitril-valsartan (ENTRESTO) 49-51 MG; Take 1 tablet by mouth 2 (two) times daily.  Dispense: 60 tablet; Refill: 4 +recent labs.  No labs needed today other than glucose and A1C Patient have been counseled extensively about nutrition and exercise  Return in about 3 months (around 01/02/2019) for assign PCP.  The patient was given clear instructions to go to ER or return to medical center if symptoms don't improve, worsen or new problems develop. The patient verbalized understanding. The patient was told to call to get lab results if they haven't heard anything in the next week.      Georgian Co, PA-C [facility] [place], [place] [phone]   10/03/2018, 12:26 PM

## 2018-10-10 ENCOUNTER — Other Ambulatory Visit (HOSPITAL_COMMUNITY): Payer: Self-pay | Admitting: *Deleted

## 2018-10-10 DIAGNOSIS — Z7901 Long term (current) use of anticoagulants: Secondary | ICD-10-CM

## 2018-10-10 DIAGNOSIS — Z95811 Presence of heart assist device: Secondary | ICD-10-CM

## 2018-10-15 ENCOUNTER — Other Ambulatory Visit (HOSPITAL_COMMUNITY): Payer: Medicaid Other

## 2018-10-18 ENCOUNTER — Other Ambulatory Visit (HOSPITAL_COMMUNITY): Payer: Medicaid Other

## 2018-10-18 MED FILL — ?GLIPIZIDE 5MG TABLET: 5 | 30 days supply | Qty: 30 | Fill #0

## 2018-10-22 ENCOUNTER — Other Ambulatory Visit: Payer: Self-pay

## 2018-10-22 ENCOUNTER — Ambulatory Visit (HOSPITAL_COMMUNITY): Payer: Self-pay | Admitting: Pharmacist

## 2018-10-22 ENCOUNTER — Ambulatory Visit (HOSPITAL_COMMUNITY)
Admission: RE | Admit: 2018-10-22 | Discharge: 2018-10-22 | Disposition: A | Discharge status: Home / Self Care | Payer: Medicaid Other | Source: Ambulatory Visit | Attending: Internal Medicine | Admitting: Internal Medicine

## 2018-10-22 DIAGNOSIS — Z7901 Long term (current) use of anticoagulants: Secondary | ICD-10-CM | POA: Insufficient documentation

## 2018-10-22 DIAGNOSIS — Z95811 Presence of heart assist device: Secondary | ICD-10-CM | POA: Insufficient documentation

## 2018-10-22 LAB — PROTIME-INR
INR: 2.4 — ABNORMAL HIGH (ref 0.8–1.2)
Prothrombin Time: 26 seconds — ABNORMAL HIGH (ref 11.4–15.2)

## 2018-10-22 NOTE — Progress Notes (Signed)
Patient presents to clinic today for drive line exit wound care. Existing VAD dressing removed and site care performed using sterile technique. Drive line exit site cleaned with Chlora prep applicators x 2, allowed to dry, and gauze dressing with bio patch re-applied. Exit site healed and incorporated, the velour is fully implanted at exit site. Small amount of brown/yellow drainge. No redness, tenderness,  foul odor or rash noted. Drive line anchor re-applied. Pt denies fever or chills.   Return in 1 week for another dressing change per standard of care. INR to be repeated in 1-2 weeks pending results per anticoagulation protocol.   Pt is wanting to do his own dressing changes. Pt was informed today that this is not an option because pt is high risk for infection. The current dressing has been on for 2 weeks as the pts caregiver is busy with school and has been unable to do dressing changes weekly. Pt has agreed to come to clinic once a week for dressing changes.   Tanda Rockers RN, BSN VAD Coordinator 24/7 Pager [phone]

## 2018-10-22 NOTE — Progress Notes (Signed)
LVAD INR 

## 2018-10-22 NOTE — Addendum Note (Signed)
Encounter addended by: Christinia Gully, RN on: 10/22/2018 11:58 AM  Actions taken: Clinical Note Signed

## 2018-10-25 ENCOUNTER — Other Ambulatory Visit (HOSPITAL_COMMUNITY): Payer: Self-pay | Admitting: *Deleted

## 2018-10-25 DIAGNOSIS — Z7901 Long term (current) use of anticoagulants: Secondary | ICD-10-CM

## 2018-10-25 DIAGNOSIS — Z95811 Presence of heart assist device: Secondary | ICD-10-CM

## 2018-10-31 ENCOUNTER — Ambulatory Visit (HOSPITAL_COMMUNITY)
Admission: RE | Admit: 2018-10-31 | Discharge: 2018-10-31 | Disposition: A | Discharge status: Home / Self Care | Payer: Medicaid Other | Source: Ambulatory Visit | Attending: Cardiology | Admitting: Cardiology

## 2018-10-31 ENCOUNTER — Ambulatory Visit (HOSPITAL_COMMUNITY): Payer: Self-pay | Admitting: Pharmacist

## 2018-10-31 ENCOUNTER — Other Ambulatory Visit: Payer: Self-pay

## 2018-10-31 DIAGNOSIS — Z95811 Presence of heart assist device: Secondary | ICD-10-CM

## 2018-10-31 DIAGNOSIS — Z7901 Long term (current) use of anticoagulants: Secondary | ICD-10-CM | POA: Insufficient documentation

## 2018-10-31 LAB — PROTIME-INR
INR: 2.4 — ABNORMAL HIGH (ref 0.8–1.2)
Prothrombin Time: 26 seconds — ABNORMAL HIGH (ref 11.4–15.2)

## 2018-10-31 NOTE — Progress Notes (Signed)
Patient presents to clinic today for drive line exit wound care. Existing VAD dressing removed and site care performed using sterile technique. Drive line exit site cleaned with Chlora prep applicators x 2, allowed to dry, and gauze dressing with silver strip re-applied. Exit site healed and incorporated, the velour is fully implanted at exit site. Small amount of bloody drainge. No redness, tenderness,  foul odor or rash noted. Drive line anchor re-applied. Pt denies fever or chills. Pt was provided with abdominal binder and 4 daily dressing kits.  Return in 1 week for another dressing change per standard of care. INR to be repeated in 1-2 weeks pending results per anticoagulation protocol.    Tanda Rockers RN, BSN VAD Coordinator 24/7 Pager [phone]

## 2018-10-31 NOTE — Addendum Note (Signed)
Encounter addended by: Christinia Gully, RN on: 10/31/2018 10:59 AM  Actions taken: Clinical Note Signed

## 2018-10-31 NOTE — Progress Notes (Signed)
LVAD INR 

## 2018-11-01 ENCOUNTER — Other Ambulatory Visit (HOSPITAL_COMMUNITY): Payer: Self-pay | Admitting: Unknown Physician Specialty

## 2018-11-01 DIAGNOSIS — Z95811 Presence of heart assist device: Secondary | ICD-10-CM

## 2018-11-01 DIAGNOSIS — Z7901 Long term (current) use of anticoagulants: Secondary | ICD-10-CM

## 2018-11-01 NOTE — Addendum Note (Signed)
Addended by: Tanda Rockers B on: 11/01/2018 01:59 PM   Modules accepted: Orders

## 2018-11-07 ENCOUNTER — Other Ambulatory Visit (HOSPITAL_COMMUNITY): Payer: Medicaid Other

## 2018-11-14 ENCOUNTER — Other Ambulatory Visit (HOSPITAL_COMMUNITY): Payer: Medicaid Other

## 2018-11-18 ENCOUNTER — Other Ambulatory Visit: Payer: Self-pay | Admitting: Family Medicine

## 2018-11-18 ENCOUNTER — Other Ambulatory Visit (HOSPITAL_COMMUNITY): Payer: Self-pay | Admitting: Unknown Physician Specialty

## 2018-11-18 DIAGNOSIS — Z95811 Presence of heart assist device: Secondary | ICD-10-CM

## 2018-11-18 DIAGNOSIS — Z7901 Long term (current) use of anticoagulants: Secondary | ICD-10-CM

## 2018-11-18 DIAGNOSIS — E1169 Type 2 diabetes mellitus with other specified complication: Secondary | ICD-10-CM

## 2018-11-19 ENCOUNTER — Ambulatory Visit (HOSPITAL_COMMUNITY)
Admission: RE | Admit: 2018-11-19 | Discharge: 2018-11-19 | Disposition: A | Discharge status: Home / Self Care | Payer: Self-pay | Source: Ambulatory Visit | Attending: Cardiology | Admitting: Cardiology

## 2018-11-19 ENCOUNTER — Ambulatory Visit (HOSPITAL_COMMUNITY): Payer: Self-pay | Admitting: Pharmacist

## 2018-11-19 ENCOUNTER — Encounter (HOSPITAL_COMMUNITY): Payer: Self-pay

## 2018-11-19 ENCOUNTER — Encounter (HOSPITAL_COMMUNITY): Payer: Medicaid Other

## 2018-11-19 ENCOUNTER — Other Ambulatory Visit: Payer: Self-pay

## 2018-11-19 VITALS — BP 105/74 | HR 79 | Wt 262.2 lb

## 2018-11-19 DIAGNOSIS — N1831 Chronic kidney disease, stage 3a: Secondary | ICD-10-CM

## 2018-11-19 DIAGNOSIS — I130 Hypertensive heart and chronic kidney disease with heart failure and stage 1 through stage 4 chronic kidney disease, or unspecified chronic kidney disease: Secondary | ICD-10-CM | POA: Insufficient documentation

## 2018-11-19 DIAGNOSIS — Z7984 Long term (current) use of oral hypoglycemic drugs: Secondary | ICD-10-CM | POA: Insufficient documentation

## 2018-11-19 DIAGNOSIS — I10 Essential (primary) hypertension: Secondary | ICD-10-CM

## 2018-11-19 DIAGNOSIS — I480 Paroxysmal atrial fibrillation: Secondary | ICD-10-CM

## 2018-11-19 DIAGNOSIS — E1122 Type 2 diabetes mellitus with diabetic chronic kidney disease: Secondary | ICD-10-CM | POA: Insufficient documentation

## 2018-11-19 DIAGNOSIS — Z87891 Personal history of nicotine dependence: Secondary | ICD-10-CM | POA: Insufficient documentation

## 2018-11-19 DIAGNOSIS — E039 Hypothyroidism, unspecified: Secondary | ICD-10-CM | POA: Insufficient documentation

## 2018-11-19 DIAGNOSIS — Z8249 Family history of ischemic heart disease and other diseases of the circulatory system: Secondary | ICD-10-CM | POA: Insufficient documentation

## 2018-11-19 DIAGNOSIS — Z7982 Long term (current) use of aspirin: Secondary | ICD-10-CM | POA: Insufficient documentation

## 2018-11-19 DIAGNOSIS — I5042 Chronic combined systolic (congestive) and diastolic (congestive) heart failure: Secondary | ICD-10-CM | POA: Insufficient documentation

## 2018-11-19 DIAGNOSIS — N183 Chronic kidney disease, stage 3 unspecified: Secondary | ICD-10-CM | POA: Insufficient documentation

## 2018-11-19 DIAGNOSIS — Z7901 Long term (current) use of anticoagulants: Secondary | ICD-10-CM

## 2018-11-19 DIAGNOSIS — Z79899 Other long term (current) drug therapy: Secondary | ICD-10-CM | POA: Insufficient documentation

## 2018-11-19 DIAGNOSIS — Z95811 Presence of heart assist device: Secondary | ICD-10-CM

## 2018-11-19 DIAGNOSIS — I428 Other cardiomyopathies: Secondary | ICD-10-CM | POA: Insufficient documentation

## 2018-11-19 DIAGNOSIS — R946 Abnormal results of thyroid function studies: Secondary | ICD-10-CM | POA: Insufficient documentation

## 2018-11-19 DIAGNOSIS — I5022 Chronic systolic (congestive) heart failure: Secondary | ICD-10-CM

## 2018-11-19 LAB — COMPREHENSIVE METABOLIC PANEL
ALT: 24 U/L (ref 0–44)
AST: 37 U/L (ref 15–41)
Albumin: 3.5 g/dL (ref 3.5–5.0)
Alkaline Phosphatase: 38 U/L (ref 38–126)
Anion gap: 13 (ref 5–15)
BUN: 19 mg/dL (ref 6–20)
CO2: 23 mmol/L (ref 22–32)
Calcium: 9.2 mg/dL (ref 8.9–10.3)
Chloride: 101 mmol/L (ref 98–111)
Creatinine, Ser: 1.8 mg/dL — ABNORMAL HIGH (ref 0.61–1.24)
GFR calc Af Amer: 50 mL/min — ABNORMAL LOW (ref >60)
GFR calc non Af Amer: 43 mL/min — ABNORMAL LOW (ref >60)
Glucose, Bld: 145 mg/dL — ABNORMAL HIGH (ref 70–99)
Potassium: 5 mmol/L (ref 3.5–5.1)
Sodium: 137 mmol/L (ref 135–145)
Total Bilirubin: 1 mg/dL (ref 0.3–1.2)
Total Protein: 7.6 g/dL (ref 6.5–8.1)

## 2018-11-19 LAB — CBC
HCT: 48.2 % (ref 39.0–52.0)
Hemoglobin: 15.1 g/dL (ref 13.0–17.0)
MCH: 29.7 pg (ref 26.0–34.0)
MCHC: 31.3 g/dL (ref 30.0–36.0)
MCV: 94.7 fL (ref 80.0–100.0)
Platelets: 225 10*3/uL (ref 150–400)
RBC: 5.09 MIL/uL (ref 4.22–5.81)
RDW: 14.9 % (ref 11.5–15.5)
WBC: 6.1 10*3/uL (ref 4.0–10.5)
nRBC: 0 % (ref 0.0–0.2)

## 2018-11-19 LAB — PROTIME-INR
INR: 1.7 — ABNORMAL HIGH (ref 0.8–1.2)
Prothrombin Time: 19.8 seconds — ABNORMAL HIGH (ref 11.4–15.2)

## 2018-11-19 LAB — IRON AND TIBC
Iron: 90 ug/dL (ref 45–182)
Saturation Ratios: 31 % (ref 17.9–39.5)
TIBC: 287 ug/dL (ref 250–450)
UIBC: 197 ug/dL

## 2018-11-19 LAB — LACTATE DEHYDROGENASE: LDH: 347 U/L — ABNORMAL HIGH (ref 98–192)

## 2018-11-19 LAB — PREALBUMIN: Prealbumin: 26.9 mg/dL (ref 18–38)

## 2018-11-19 LAB — FOLATE: Folate: 11.9 ng/mL (ref >5.9)

## 2018-11-19 LAB — FERRITIN: Ferritin: 286 ng/mL (ref 24–336)

## 2018-11-19 LAB — VITAMIN B12: Vitamin B-12: 318 pg/mL (ref 180–914)

## 2018-11-19 MED FILL — ?GLIPIZIDE 5MG TABLET: 5 | 30 days supply | Qty: 30 | Fill #0

## 2018-11-19 MED FILL — PANTOPRAZOLE SOD DR 40 MG T: 40 | 30 days supply | Qty: 30 | Fill #1

## 2018-11-19 NOTE — Progress Notes (Signed)
LVAD INR 

## 2018-11-19 NOTE — Progress Notes (Signed)
Patient presents for 2 month follow up in [facility] today. Reports no problems with VAD equipment or concerns with drive line.   Pt says he has no limitations to daily activities. He has been golfing a few days a week. He has also been going to the mall and walking for 40 minutes 4-5 times per week. Denies shortness of breath with activity. He is observing Covid precautions.   Reports that he has been taking ASA every other day because he has run out. Instructed him to pick up refill today and resume taking daily for prevention of pump thrombus. He verbalized understanding.   Pt's weight is up 10lbs today. He reports he has not felt that he has fluid on board, but that he "has been eating a lot of junk food and salt."  Discussed importance of eating a healthier diet.   Vital Signs:        Doppler Pressure: 120 Automatc BP: 105/74 (87)    HR: 79      SPO2: 99% on RA       Weight: 262.2 lb w/ eqt  Last weight: 253.0 lb  VAD Indication: Destination Therapy - due to limited social support. Pt discussed with Dr. Mosetta Pigeon at [facility] prior to LVAD implant.  LVAD assessment:      Speed: 5300 RPM      Flow: 3.9       Power: 3.9 w   Power spikes up to 9.0-9.1 on 11/2 and 11/3. Flow, speed, PI WNL during those episodes. Power ranges 3.9-7.0 most days.       PI: 6.5       Alarms: none  Events: 40-60 daily Hct: 20       Fixed speed: 5300 Low speed limit: 5000  Primary Controller: Replace back up battery in 15 months. Back up controller: Replace back up battery in 16 months.  I reviewed the LVAD parameters from today and compared the results to the patient's prior recorded data. LVAD interrogation was STABLE for significant power changes, NEGATIVE for clinical alarms and STABLE for PI events/speed drops. No programming changes were made and pump is functioning within specified parameters. Pt is performing daily controller and system monitor self tests along with completing weekly and monthly  maintenance for LVAD equipment.  LVAD equipment check completed and is in good working order. Back-up equipment present.   Annual maintenance completed on patient's home equipment per [employer] today, 07/23/18.  Exit Site Care: Benjamin Sherman is doing weekly dressing change on Thursdays. Existing VAD dressing removed and site care performed using sterile technique. Sorbaview dressing and bio patch wet. Drive line exit site cleaned with Chlora prep applicators x 2, allowed to dry, and gauze dressing applied. Exit site healed and incorporated, the velour is fully implanted at exit site. Small amount of thin yellow drainage noted on gauze. No redness, tenderness, foul odor or rash noted. Drive line anchor re-applied. Pt denies fever or chills. Pt instructed to switch to daily dressing kits and change as needed if it becomes wet with sweat. He states he has been going through 3 anchors a week due to sweat.  Pt verbalized understanding of same. Provided pt with 16 anchors, otherwise he has adequate dressing supplies at home.   Device:  N/A  BP & Labs:  Doppler 120 - is reflecting modified systolic  Hgb 41.3 - No S/S of bleeding. Specifically denies melena/BRBPR or nosebleeds.  LDH 347 - and is within established baseline of 200 - 400. Denies tea-colored  urine. No power elevations noted on interrogation other than above noted incidents.   1.5 yr [facility]acs follow up completed including:  Quality of Life, KCCQ-12, and Neurocognitive trail making.   Pt completed 1425  feet during 6 minute walk.  Back up controller:  11V backup battery charged during this visit.  Patient Instructions:  1. No medication changes today 2. Coumadin dosing per Karle Plumber PharmD- take 6mg  tonight then continue current regimen of 6mg  (1 tablet) Monday and Friday and 3 mg (1/2 tablet) all other days.  3. Return to VAD clinic in 1 week for dressing change and INR 4. Return to VAD clinic in 2 months for full VAD clinic  appointment  Sunday RN VAD Coordinator  Office: [phone]  24/7 Pager: [phone]

## 2018-11-19 NOTE — Patient Instructions (Addendum)
1. No medication changes today 2. Coumadin dosing per Audry Riles PharmD- take 6mg  tonight then continue current regimen of 6mg  (1 tablet) Monday and Friday and 3 mg (1/2 tablet) all other days.  3. Return to [place] [facility] in 1 week for dressing change and INR 4. Return to [place] clinic in 2 months for full VAD clinic appointment

## 2018-11-20 NOTE — Addendum Note (Signed)
Encounter addended by: Jolaine Artist, MD on: 11/20/2018 11:23 AM  Actions taken: Clinical Note Signed, Level of Service modified, Visit diagnoses modified, Charge Capture section accepted

## 2018-11-20 NOTE — Progress Notes (Signed)
LVAD Clinic Note   Primary Cardiologist: Dr. Gala Romney   HPI: Benjamin Sherman is a 48 y.o. 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 ECHO showed severely reduced EF. 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 Placed on steroids without benefit.  Admitted 11/18 after fall/syncopal episode resulting in bifrontal SAH. Neurosurgery consulted.   Admitted 4/19 with cardiogenic shock in setting of AFL. Supported with inotropes and underwent DC-CV but had persistent shock. After numerous discussions about concern for adequate social support underwent placement of HM-3 LVAD on 05/15/17. D/c home 06/04/17  Today he returns for VAD follow up. Continues to do very well. Playing golf regularly. Going to mall and walking for 40 mins 4-5x /week. No SOB. No edema orthopnea or PND.  Weight is up 10lbs today. He reports he has not felt that he has fluid on board, but that he "has been eating a lot of junk food and salt." Denies orthopnea or PND. No fevers, chills or problems with driveline. No bleeding, melena or neuro symptoms. No VAD alarms. Taking all meds as prescribed.   VAD Indication:  Destination Therapy - due to limited social support. Pt discussed with Dr. Edwena Blow at [facility] prior to LVAD implant.   LVAD assessment:  Speed: 5300 RPM  Flow: 3.9  Power: 3.9 w Power spikes up to 9.0-9.1 on 11/2 and 11/3. Flow, speed, PI WNL during those episodes. Power ranges 3.9-7.0 most days.  PI: 6.5  Alarms: none  Events: 40-60 daily  Hct: 20   Fixed speed: 5300  Low speed limit: 5000  Primary Controller: Replace back up battery in 15 months.  Back up controller: Replace back up battery in 16 months.   Review of systems complete and found to be negative unless listed in HPI.   Past Medical History:  Diagnosis Date  . CHF (congestive heart  failure) (HCC)   . Diabetes mellitus without complication (HCC)     Current Outpatient Medications  Medication Sig Dispense Refill  . amiodarone (PACERONE) 100 MG tablet Take 1 tablet (100 mg total) by mouth daily. 90 tablet 3  . amLODipine (NORVASC) 5 MG tablet Take 1 tablet (5 mg total) by mouth daily. 90 tablet 3  . aspirin 81 MG EC tablet Take 1 tablet (81 mg total) by mouth daily. 90 tablet 3  . Blood Glucose Monitoring Suppl (TRUE METRIX METER) DEVI 1 each by Does not apply route daily with breakfast. 1 Device 0  . ferrous sulfate 325 (65 FE) MG EC tablet Take 1 tablet (325 mg total) by mouth 2 (two) times daily. 180 tablet 1  . glucose blood (TRUE METRIX BLOOD GLUCOSE TEST) test strip Use daily before breakfast 30 each 12  . pantoprazole (PROTONIX) 40 MG tablet Take 1 tablet (40 mg total) by mouth daily. 30 tablet 5  . sacubitril-valsartan (ENTRESTO) 49-51 MG Take 1 tablet by mouth 2 (two) times daily. 60 tablet 4  . TRUEPLUS LANCETS 28G MISC 1 each by Does not apply route daily. 30 each 12  . warfarin (COUMADIN) 6 MG tablet Take 1/2 tablet (3 mg) daily except 1 tablet (6 mg) on Monday and Friday 90 tablet 3  . glipiZIDE (GLUCOTROL) 5 MG tablet TAKE 0.5 TABLETS (2.5 MG TOTAL) BY MOUTH 2 (TWO) TIMES DAILY BEFORE A MEAL. MUST MAKE APPT FOR FURTHER REFILLS 30 tablet 1   No  current facility-administered medications for this encounter.    Allergies  Allergen Reactions  . Bee Venom     UNSPECIFIED REACTION    Social History   Socioeconomic History  . Marital status: Single    Spouse name: Not on file  . Number of children: Not on file  . Years of education: Not on file  . Highest education level: Not on file  Occupational History  . Occupation: Air cabin crew Needs  . Financial resource strain: Not on file  . Food insecurity    Worry: Not on file    Inability: Not on file  . Transportation needs    Medical: Not on file    Non-medical: Not on file  Tobacco Use  . Smoking  status: Former Smoker    Types: Cigars    Quit date: 11/16/2016    Years since quitting: 2.0  . Smokeless tobacco: Never Used  . Tobacco comment: 6 cigars per wk  Substance and Sexual Activity  . Alcohol use: Yes    Alcohol/week: 14.0 standard drinks    Types: 12 Cans of beer, 2 Shots of liquor per week    Comment: occ  . Drug use: No  . Sexual activity: Not on file  Lifestyle  . Physical activity    Days per week: Not on file    Minutes per session: Not on file  . Stress: Not on file  Relationships  . Social Herbalist on phone: Not on file    Gets together: Not on file    Attends religious service: Not on file    Active member of club or organization: Not on file    Attends meetings of clubs or organizations: Not on file    Relationship status: Not on file  . Intimate partner violence    Fear of current or ex partner: Not on file    Emotionally abused: Not on file    Physically abused: Not on file    Forced sexual activity: Not on file  Other Topics Concern  . Not on file  Social History Narrative  . Not on file    Family History  Problem Relation Age of Onset  . CAD Father    Vitals:   11/19/18 1011 11/19/18 1012  BP: (!) 120/0 105/74  Pulse: 79   SpO2: 99%   Weight: 118.9 kg (262 lb 3.2 oz)     Wt Readings from Last 3 Encounters:  11/19/18 118.9 kg (262 lb 3.2 oz)  10/03/18 116.1 kg (256 lb)  09/17/18 114.8 kg (253 lb)      Vital Signs:  Doppler Pressure: 120  Automatc BP: 105/74 (87)  HR: 79  SPO2: 99% on RA  Weight: 262.2 lb w/ eqt  Last weight: 253.0 lb     Physical Exam: General:  NAD.  HEENT: normal edentulous  Neck: supple. JVP not elevated.  Carotids 2+ bilat; no bruits. No lymphadenopathy or thryomegaly appreciated. Cor: LVAD hum.  Lungs: Clear. Abdomen: obese soft, nontender, non-distended. No hepatosplenomegaly. No bruits or masses. Good bowel sounds. Driveline site clean. Anchor in place.  Extremities: no cyanosis,  clubbing, rash. Warm no edema  Neuro: alert & oriented x 3. No focal deficits. Moves all 4 without problem    ASSESSMENT & PLAN:  1. Chronic Combined Systolic/Diastolic Heart Failure. NICM  - ECHO 4/19 EF 20% CMRI EF 22%  - s/p HM-3 LVAD on 05/15/17 - Doing very well. Remains NYHA I on VAD support -  Volume status looks good off lasix despite weight gain.    2. VAD management Driveline with serous exudate expressed. CX exudate, check CBC, and start keflex 250 mg three times a day for 7 days.  - VAD interrogated personally. Parameters stable. - Still with 40-60 PI events daily despite better BP control. Continue to remind him to keep up with fluid intake. Currently no clinical significance - LDH 347 - Continue ASA 81 mg daily + coumadin. -  INR 1.7 INR goal 2-2.5. Discussed dosing with PharmD personally.  3. AFL - Maintaining NSR - Continue amio 100 mg daily.   4. HTN - Blood pressure well controlled. Continue current regimen.  5. Hypothyroidism - TSH mildly elevated 06/2017 at 5.4, but T3 and T4 normal - Continue to follow on amio.    6. DMII - Per PCP  7. CKD 3 - Creatinine stable 1.8 - Continue fluid intake  Total time spent 35 minutes. Over half that time spent discussing above.    [name], MD 11/20/18

## 2018-11-21 ENCOUNTER — Other Ambulatory Visit (HOSPITAL_COMMUNITY): Payer: Self-pay | Admitting: *Deleted

## 2018-11-21 DIAGNOSIS — Z95811 Presence of heart assist device: Secondary | ICD-10-CM

## 2018-11-21 DIAGNOSIS — Z7901 Long term (current) use of anticoagulants: Secondary | ICD-10-CM

## 2018-11-27 ENCOUNTER — Other Ambulatory Visit: Payer: Self-pay

## 2018-11-27 ENCOUNTER — Ambulatory Visit (HOSPITAL_COMMUNITY): Payer: Self-pay | Admitting: Pharmacist

## 2018-11-27 ENCOUNTER — Ambulatory Visit (HOSPITAL_COMMUNITY)
Admission: RE | Admit: 2018-11-27 | Discharge: 2018-11-27 | Disposition: A | Discharge status: Home / Self Care | Payer: Medicaid Other | Source: Ambulatory Visit | Attending: Cardiology | Admitting: Cardiology

## 2018-11-27 DIAGNOSIS — Z95811 Presence of heart assist device: Secondary | ICD-10-CM | POA: Insufficient documentation

## 2018-11-27 DIAGNOSIS — Z7901 Long term (current) use of anticoagulants: Secondary | ICD-10-CM | POA: Insufficient documentation

## 2018-11-27 DIAGNOSIS — Z4801 Encounter for change or removal of surgical wound dressing: Secondary | ICD-10-CM | POA: Insufficient documentation

## 2018-11-27 LAB — PROTIME-INR
INR: 2.4 — ABNORMAL HIGH (ref 0.8–1.2)
Prothrombin Time: 25.5 seconds — ABNORMAL HIGH (ref 11.4–15.2)

## 2018-11-27 NOTE — Progress Notes (Signed)
Patient presents to clinic today for drive line exit wound care. Existing VAD dressing removed and site care performed using sterile technique. Drive line exit site cleaned with Chlora prep applicators x 2, allowed to dry, and gauze dressing with silver strip re-applied. Exit site healed and incorporated, the velour is fully implanted at exit site. Scant amount of brown drainage No redness, tenderness,  foul odor or rash noted. Drive line anchor re-applied. Pt denies fever or chills. Pt states he has adequate dressings at home.  Return in 1 week for another dressing change per standard of care. INR to be repeated in 1-2 weeks pending results per anticoagulation protocol.    Tanda Rockers RN, BSN VAD Coordinator 24/7 Pager [phone]

## 2018-11-27 NOTE — Progress Notes (Signed)
LVAD INR 

## 2018-11-27 NOTE — Addendum Note (Signed)
Encounter addended by: Christinia Gully, RN on: 11/27/2018 12:27 PM  Actions taken: Clinical Note Signed

## 2018-11-29 ENCOUNTER — Other Ambulatory Visit (HOSPITAL_COMMUNITY): Payer: Self-pay | Admitting: *Deleted

## 2018-11-29 DIAGNOSIS — Z95811 Presence of heart assist device: Secondary | ICD-10-CM

## 2018-11-29 DIAGNOSIS — Z7901 Long term (current) use of anticoagulants: Secondary | ICD-10-CM

## 2018-12-04 ENCOUNTER — Other Ambulatory Visit (HOSPITAL_COMMUNITY): Payer: Medicaid Other

## 2018-12-06 ENCOUNTER — Other Ambulatory Visit (HOSPITAL_COMMUNITY): Payer: Self-pay | Admitting: *Deleted

## 2018-12-06 DIAGNOSIS — Z95811 Presence of heart assist device: Secondary | ICD-10-CM

## 2018-12-06 DIAGNOSIS — Z7901 Long term (current) use of anticoagulants: Secondary | ICD-10-CM

## 2018-12-10 ENCOUNTER — Ambulatory Visit (HOSPITAL_COMMUNITY)
Admission: RE | Admit: 2018-12-10 | Discharge: 2018-12-10 | Disposition: A | Discharge status: Home / Self Care | Payer: Medicaid Other | Source: Ambulatory Visit | Attending: Internal Medicine | Admitting: Internal Medicine

## 2018-12-10 ENCOUNTER — Ambulatory Visit (HOSPITAL_COMMUNITY): Payer: Self-pay | Admitting: Pharmacist

## 2018-12-10 ENCOUNTER — Other Ambulatory Visit: Payer: Self-pay

## 2018-12-10 DIAGNOSIS — Z95811 Presence of heart assist device: Secondary | ICD-10-CM | POA: Insufficient documentation

## 2018-12-10 DIAGNOSIS — Z7901 Long term (current) use of anticoagulants: Secondary | ICD-10-CM

## 2018-12-10 LAB — PROTIME-INR
INR: 2.6 — ABNORMAL HIGH (ref 0.8–1.2)
Prothrombin Time: 28.1 seconds — ABNORMAL HIGH (ref 11.4–15.2)

## 2018-12-10 NOTE — Addendum Note (Signed)
Encounter addended by: Christinia Gully, RN on: 12/10/2018 11:32 AM  Actions taken: Clinical Note Signed

## 2018-12-10 NOTE — Progress Notes (Signed)
Patient presents to clinic today for drive line exit wound care. Existing VAD dressing removed and site care performed using sterile technique. Drive line exit site cleaned with Chlora prep applicators x 2, allowed to dry, and Sorbaview with bio-patch re-applied. Exit site healed and incorporated, the velour is fully implanted at exit site. Scant amount of brown drainage No redness, tenderness,  foul odor or rash noted. Drive line anchor re-applied. Pt denies fever or chills. Pt states he has adequate dressings at home.  Dr  Return in 1 week for another dressing change per standard of care. INR to be repeated in 1-2 weeks pending results per anticoagulation protocol.    Tanda Rockers RN, BSN VAD Coordinator 24/7 Pager [phone]

## 2018-12-10 NOTE — Progress Notes (Signed)
LVAD INR 

## 2018-12-11 ENCOUNTER — Other Ambulatory Visit (HOSPITAL_COMMUNITY): Payer: Self-pay | Admitting: *Deleted

## 2018-12-11 DIAGNOSIS — Z95811 Presence of heart assist device: Secondary | ICD-10-CM

## 2018-12-11 DIAGNOSIS — Z7901 Long term (current) use of anticoagulants: Secondary | ICD-10-CM

## 2018-12-17 ENCOUNTER — Ambulatory Visit (HOSPITAL_COMMUNITY): Payer: Self-pay | Admitting: Pharmacist

## 2018-12-17 ENCOUNTER — Ambulatory Visit (HOSPITAL_COMMUNITY)
Admission: RE | Admit: 2018-12-17 | Discharge: 2018-12-17 | Disposition: A | Discharge status: Home / Self Care | Payer: Medicaid Other | Source: Ambulatory Visit | Attending: Cardiology | Admitting: Cardiology

## 2018-12-17 ENCOUNTER — Other Ambulatory Visit: Payer: Self-pay

## 2018-12-17 DIAGNOSIS — Z452 Encounter for adjustment and management of vascular access device: Secondary | ICD-10-CM | POA: Insufficient documentation

## 2018-12-17 DIAGNOSIS — Z7901 Long term (current) use of anticoagulants: Secondary | ICD-10-CM | POA: Insufficient documentation

## 2018-12-17 DIAGNOSIS — Z95811 Presence of heart assist device: Secondary | ICD-10-CM | POA: Insufficient documentation

## 2018-12-17 LAB — PROTIME-INR
INR: 2.1 — ABNORMAL HIGH (ref 0.8–1.2)
Prothrombin Time: 23.5 seconds — ABNORMAL HIGH (ref 11.4–15.2)

## 2018-12-17 NOTE — Progress Notes (Signed)
Patient presents to clinic today for drive line exit wound care. Existing VAD dressing removed and site care performed using sterile technique. Drive line exit site cleaned with Chlora prep applicators x 2, allowed to dry, and Sorbaview with bio-patch re-applied. Exit site healed and incorporated, the velour is fully implanted at exit site. No drainage, redness, tenderness,  foul odor or rash noted. Drive line anchor re-applied. Pt denies fever or chills.Pt provided with 4 weekly kits with instructions to bring one to each clinic visit for dressing changes.   Return in 1 week for another dressing change per standard of care. INR to be repeated in 1-2 weeks pending results per anticoagulation protocol.   Zada Girt RN VAD Coordinator 24/7 Pager [phone]

## 2018-12-17 NOTE — Progress Notes (Signed)
LVAD INR 

## 2018-12-17 NOTE — Addendum Note (Signed)
Encounter addended by: Lezlie Octave, RN on: 12/17/2018 11:04 AM  Actions taken: Clinical Note Signed

## 2018-12-20 ENCOUNTER — Other Ambulatory Visit (HOSPITAL_COMMUNITY): Payer: Self-pay | Admitting: Unknown Physician Specialty

## 2018-12-20 DIAGNOSIS — Z95811 Presence of heart assist device: Secondary | ICD-10-CM

## 2018-12-20 DIAGNOSIS — Z7901 Long term (current) use of anticoagulants: Secondary | ICD-10-CM

## 2018-12-23 MED FILL — ?GLIPIZIDE 5MG TABLET: 5 | 30 days supply | Qty: 30 | Fill #1

## 2018-12-23 MED FILL — PANTOPRAZOLE SOD DR 40 MG T: 40 | 30 days supply | Qty: 30 | Fill #2

## 2018-12-24 ENCOUNTER — Ambulatory Visit (HOSPITAL_COMMUNITY)
Admission: RE | Admit: 2018-12-24 | Discharge: 2018-12-24 | Disposition: A | Discharge status: Home / Self Care | Payer: Medicaid Other | Source: Ambulatory Visit | Attending: Internal Medicine | Admitting: Internal Medicine

## 2018-12-24 ENCOUNTER — Other Ambulatory Visit: Payer: Self-pay

## 2018-12-24 ENCOUNTER — Ambulatory Visit (HOSPITAL_COMMUNITY): Payer: Self-pay | Admitting: Pharmacist

## 2018-12-24 DIAGNOSIS — Z7901 Long term (current) use of anticoagulants: Secondary | ICD-10-CM | POA: Insufficient documentation

## 2018-12-24 DIAGNOSIS — Z95811 Presence of heart assist device: Secondary | ICD-10-CM | POA: Insufficient documentation

## 2018-12-24 LAB — PROTIME-INR
INR: 2.2 — ABNORMAL HIGH (ref 0.8–1.2)
Prothrombin Time: 24.7 seconds — ABNORMAL HIGH (ref 11.4–15.2)

## 2018-12-24 NOTE — Progress Notes (Signed)
Patient presents to clinic today for drive line exit wound care. Existing VAD dressing removed and site care performed using sterile technique. Drive line exit site cleaned with Chlora prep applicators x 2, allowed to dry, and Sorbaview with bio-patch re-applied. Exit site healed and incorporated, the velour is fully implanted at exit site. No drainage, redness, tenderness,  foul odor or rash noted. Drive line anchor re-applied. Pt denies fever or chills. Pt has adequate dressing supplies.   Return in 1 week for another dressing change per standard of care. INR to be repeated in 1-2 weeks pending results per anticoagulation protocol.   Kaylean Tupou RN VAD Coordinator  Office: [phone]  24/7 Pager: [phone]   

## 2018-12-24 NOTE — Progress Notes (Signed)
LVAD INR 

## 2018-12-24 NOTE — Addendum Note (Signed)
Encounter addended by: Mertha Baars, RN on: 12/24/2018 10:33 AM  Actions taken: Clinical Note Signed

## 2018-12-27 ENCOUNTER — Other Ambulatory Visit (HOSPITAL_COMMUNITY): Payer: Self-pay | Admitting: *Deleted

## 2018-12-27 ENCOUNTER — Telehealth (HOSPITAL_COMMUNITY): Payer: Self-pay | Admitting: Pharmacy Technician

## 2018-12-27 DIAGNOSIS — Z95811 Presence of heart assist device: Secondary | ICD-10-CM

## 2018-12-27 DIAGNOSIS — Z7901 Long term (current) use of anticoagulants: Secondary | ICD-10-CM

## 2018-12-27 NOTE — Telephone Encounter (Signed)
It's time to re-enroll patient to receive medication assistance for Entresto from [employer]. Tried to call patient and was unsuccessful in reaching him or a voicemail. Will get him to sign paperwork at [place] visit.  Will follow up.  Charlann Boxer, CPhT

## 2018-12-31 ENCOUNTER — Other Ambulatory Visit (HOSPITAL_COMMUNITY): Payer: Medicaid Other

## 2019-01-03 ENCOUNTER — Other Ambulatory Visit (HOSPITAL_COMMUNITY): Payer: Self-pay | Admitting: Unknown Physician Specialty

## 2019-01-03 ENCOUNTER — Other Ambulatory Visit: Payer: Self-pay

## 2019-01-03 ENCOUNTER — Ambulatory Visit (HOSPITAL_COMMUNITY)
Admission: RE | Admit: 2019-01-03 | Discharge: 2019-01-03 | Disposition: A | Discharge status: Home / Self Care | Payer: Medicaid Other | Source: Ambulatory Visit | Attending: Cardiology | Admitting: Cardiology

## 2019-01-03 ENCOUNTER — Telehealth (HOSPITAL_COMMUNITY): Payer: Self-pay | Admitting: Pharmacist

## 2019-01-03 ENCOUNTER — Ambulatory Visit (HOSPITAL_COMMUNITY): Payer: Self-pay | Admitting: Pharmacist

## 2019-01-03 DIAGNOSIS — Z5181 Encounter for therapeutic drug level monitoring: Secondary | ICD-10-CM | POA: Insufficient documentation

## 2019-01-03 DIAGNOSIS — Z95811 Presence of heart assist device: Secondary | ICD-10-CM

## 2019-01-03 DIAGNOSIS — Z7901 Long term (current) use of anticoagulants: Secondary | ICD-10-CM | POA: Insufficient documentation

## 2019-01-03 LAB — PROTIME-INR
INR: 2.3 — ABNORMAL HIGH (ref 0.8–1.2)
Prothrombin Time: 25.5 seconds — ABNORMAL HIGH (ref 11.4–15.2)

## 2019-01-03 NOTE — Progress Notes (Signed)
LVAD INR 

## 2019-01-03 NOTE — Telephone Encounter (Signed)
Sent in [employer] application to [employer] for [employer].   [phone] Fax: [phone]  Application pending, will continue to follow.  Audry Riles, PharmD, BCPS, BCCP, CPP Heart Failure Clinic Pharmacist [phone]

## 2019-01-03 NOTE — Progress Notes (Signed)
Patient presents to clinic today for drive line exit wound care. Existing VAD dressing removed and site care performed using sterile technique. Drive line exit site cleaned with Chlora prep applicators x 2, allowed to dry, and Sorbaview with bio-patch re-applied. Exit site healed and incorporated, the velour is fully implanted at exit site. No drainage, redness, tenderness,  foul odor or rash noted. Drive line anchor re-applied. Pt denies fever or chills. Pt has adequate dressing supplies.   Return in 1 week for another dressing change per standard of care. INR to be repeated in 1-2 weeks pending results per anticoagulation protocol.   Lanisha Stepanian RN VAD Coordinator  Office: [phone]  24/7 Pager: [phone]   

## 2019-01-03 NOTE — Addendum Note (Signed)
Encounter addended by: Christinia Gully, RN on: 01/03/2019 10:05 AM  Actions taken: Clinical Note Signed

## 2019-01-08 ENCOUNTER — Other Ambulatory Visit (HOSPITAL_COMMUNITY): Payer: Medicaid Other

## 2019-01-15 ENCOUNTER — Other Ambulatory Visit: Payer: Self-pay | Admitting: Family Medicine

## 2019-01-15 DIAGNOSIS — E1169 Type 2 diabetes mellitus with other specified complication: Secondary | ICD-10-CM

## 2019-01-16 MED FILL — ?GLIPIZIDE 5MG TABLET: 5 | 30 days supply | Qty: 30 | Fill #0

## 2019-01-20 ENCOUNTER — Other Ambulatory Visit (HOSPITAL_COMMUNITY): Payer: Self-pay | Admitting: *Deleted

## 2019-01-20 DIAGNOSIS — Z7901 Long term (current) use of anticoagulants: Secondary | ICD-10-CM

## 2019-01-20 DIAGNOSIS — Z95811 Presence of heart assist device: Secondary | ICD-10-CM

## 2019-01-20 DIAGNOSIS — I5022 Chronic systolic (congestive) heart failure: Secondary | ICD-10-CM

## 2019-01-20 MED FILL — PANTOPRAZOLE SOD DR 40 MG T: 40 | 30 days supply | Qty: 30 | Fill #3

## 2019-01-21 ENCOUNTER — Ambulatory Visit (HOSPITAL_COMMUNITY): Payer: Self-pay | Admitting: Pharmacist

## 2019-01-21 ENCOUNTER — Other Ambulatory Visit: Payer: Self-pay

## 2019-01-21 ENCOUNTER — Ambulatory Visit (HOSPITAL_COMMUNITY)
Admission: RE | Admit: 2019-01-21 | Discharge: 2019-01-21 | Disposition: A | Discharge status: Home / Self Care | Payer: Self-pay | Source: Ambulatory Visit | Attending: Cardiology | Admitting: Cardiology

## 2019-01-21 VITALS — BP 112/0 | HR 80 | Ht 72.0 in | Wt 236.4 lb

## 2019-01-21 DIAGNOSIS — I5022 Chronic systolic (congestive) heart failure: Secondary | ICD-10-CM | POA: Insufficient documentation

## 2019-01-21 DIAGNOSIS — N1831 Chronic kidney disease, stage 3a: Secondary | ICD-10-CM

## 2019-01-21 DIAGNOSIS — R7402 Elevation of levels of lactic acid dehydrogenase (LDH): Secondary | ICD-10-CM

## 2019-01-21 DIAGNOSIS — I10 Essential (primary) hypertension: Secondary | ICD-10-CM

## 2019-01-21 DIAGNOSIS — Z95811 Presence of heart assist device: Secondary | ICD-10-CM | POA: Insufficient documentation

## 2019-01-21 DIAGNOSIS — I480 Paroxysmal atrial fibrillation: Secondary | ICD-10-CM

## 2019-01-21 DIAGNOSIS — Z7901 Long term (current) use of anticoagulants: Secondary | ICD-10-CM | POA: Insufficient documentation

## 2019-01-21 LAB — CBC
HCT: 46.9 % (ref 39.0–52.0)
Hemoglobin: 15.1 g/dL (ref 13.0–17.0)
MCH: 29.9 pg (ref 26.0–34.0)
MCHC: 32.2 g/dL (ref 30.0–36.0)
MCV: 92.9 fL (ref 80.0–100.0)
Platelets: 216 10*3/uL (ref 150–400)
RBC: 5.05 MIL/uL (ref 4.22–5.81)
RDW: 15.2 % (ref 11.5–15.5)
WBC: 6.5 10*3/uL (ref 4.0–10.5)
nRBC: 0 % (ref 0.0–0.2)

## 2019-01-21 LAB — BASIC METABOLIC PANEL
Anion gap: 11 (ref 5–15)
BUN: 24 mg/dL — ABNORMAL HIGH (ref 6–20)
CO2: 26 mmol/L (ref 22–32)
Calcium: 9.4 mg/dL (ref 8.9–10.3)
Chloride: 102 mmol/L (ref 98–111)
Creatinine, Ser: 1.62 mg/dL — ABNORMAL HIGH (ref 0.61–1.24)
GFR calc Af Amer: 57 mL/min — ABNORMAL LOW (ref >60)
GFR calc non Af Amer: 49 mL/min — ABNORMAL LOW (ref >60)
Glucose, Bld: 204 mg/dL — ABNORMAL HIGH (ref 70–99)
Potassium: 5.3 mmol/L — ABNORMAL HIGH (ref 3.5–5.1)
Sodium: 139 mmol/L (ref 135–145)

## 2019-01-21 LAB — LACTATE DEHYDROGENASE: LDH: 491 U/L — ABNORMAL HIGH (ref 98–192)

## 2019-01-21 LAB — PROTIME-INR
INR: 1.7 — ABNORMAL HIGH (ref 0.8–1.2)
Prothrombin Time: 20.1 seconds — ABNORMAL HIGH (ref 11.4–15.2)

## 2019-01-21 NOTE — Progress Notes (Signed)
Patient presents for 2 month follow up in VAD Clinic today. Reports no problems with VAD equipment or concerns with drive line.   Pt says he has no limitations to daily activities. He has been golfing a few days a week. He has also been going to the mall and walking for 40 minutes 4-5 times per week. Denies shortness of breath with activity. He is observing Covid precautions.   Reports that he has not been taking ASA because he has run out. Instructed him to pick up refill today and resume taking daily for prevention of pump thrombus. He verbalized understanding. He also is out of Norvasc and Amiodarone, he assures me that he will pick these up and restart them. Since he has not been taking Amiodarone we will stop for now.   Vital Signs:        Doppler Pressure: 112 Automatc BP: 122/87 (104)    HR: 80      SPO2: 100% on RA       Weight: 263.4 lb w/ eqt  Last weight: 262.2 lb  VAD Indication: Destination Therapy - due to limited social support. Pt discussed with Dr. Edwena Blow at [facility] prior to LVAD implant.  LVAD assessment:      Speed: 5300 RPM      Flow: 3.7       Power: 4.1 w       PI: 8.4       Alarms: none  Events: 20-40 daily Hct: 20       Fixed speed: 5300 Low speed limit: 5000  Primary Controller: Replace back up battery in 13 months. Back up controller: Replace back up battery in 14 months.  I reviewed the LVAD parameters from today and compared the results to the patient's prior recorded data. LVAD interrogation was STABLE for significant power changes, NEGATIVE for clinical alarms and STABLE for PI events/speed drops. No programming changes were made and pump is functioning within specified parameters. Pt is performing daily controller and system monitor self tests along with completing weekly and monthly maintenance for LVAD equipment.  LVAD equipment check completed and is in good working order. Back-up equipment present.   Annual maintenance completed on  patient's home equipment per [employer] today, 07/23/18.  Exit Site Care: Gene is doing weekly dressing change on Thursdays. Existing VAD dressing removed and site care performed using sterile technique. Drive line exit site cleaned with Chlora prep applicators x 2, allowed to dry, and gauze dressing applied. Exit site healed and incorporated, the velour is fully implanted at exit site. Scatnt amount of thin yellow drainage noted on gauze. No redness, tenderness, foul odor or rash noted. Drive line anchor re-applied. Pt denies fever or chills.  pt  has adequate dressing supplies at home.   Device:  N/A  BP & Labs:  Doppler 112 - is reflecting modified systolic  Hgb 15.1 - No S/S of bleeding. Specifically denies melena/BRBPR or nosebleeds.  LDH 491 - and is within established baseline of 200 - 400. Denies tea-colored urine. No power elevations noted on interrogation other than above noted incidents. Will add a LDH to his labs for next week.  Patient Instructions:  1. You must start back on your Aspirin 81 mg, Norvasc 5mg  ASAP.  2. Stop Amiodarone. 3. Coumadin dosing per PharmD- take 6mg  tonight then continue current regimen of 6mg  (1 tablet) Monday and Friday and 3 mg (1/2 tablet) all other days.  4. Return to VAD clinic in 1 week for  dressing change and INR/LDH and BP check 5. Return to [place] clinic in 2 months for full VAD clinic appointment  Tanda Rockers RN [place] Coordinator  Office: [phone]  24/7 Pager: [phone]

## 2019-01-21 NOTE — Patient Instructions (Signed)
1. You must start back on your Aspirin 81 mg, Norvasc 5mg  ASAP.  2. Stop Amiodarone. 3. Coumadin dosing per PharmD- take 6mg  tonight then continue current regimen of 6mg  (1 tablet) Monday and Friday and 3 mg (1/2 tablet) all other days.  4. Return to VAD clinic in 1 week for dressing change and INR and BP check 5. Return to VAD clinic in 2 months for full VAD clinic appointmen

## 2019-01-21 NOTE — Progress Notes (Signed)
LVAD INR 

## 2019-01-23 ENCOUNTER — Other Ambulatory Visit (HOSPITAL_COMMUNITY): Payer: Self-pay | Admitting: *Deleted

## 2019-01-23 DIAGNOSIS — Z7901 Long term (current) use of anticoagulants: Secondary | ICD-10-CM

## 2019-01-23 DIAGNOSIS — Z95811 Presence of heart assist device: Secondary | ICD-10-CM

## 2019-01-25 NOTE — Progress Notes (Signed)
LVAD Clinic Note   Primary Cardiologist: Dr. Gala Romney   HPI: Benjamin Sherman is a 49 y.o. 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 ECHO showed severely reduced EF. 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 Placed on steroids without benefit.  Admitted 11/18 after fall/syncopal episode resulting in bifrontal SAH. Neurosurgery consulted.   Admitted 4/19 with cardiogenic shock in setting of AFL. Supported with inotropes and underwent DC-CV but had persistent shock. After numerous discussions about concern for adequate social support underwent placement of HM-3 LVAD on 05/15/17. D/c home 06/04/17  Today he returns for VAD follow up. Overall doing well. Remains active with walking and playing golf as weather permits. Denies orthopnea or PND. No fevers, chills or problems with driveline. No bleeding, melena or neuro symptoms. No VAD alarms. Says he ran out of ASA, amlodipine and amio so has not been taking.   VAD Indication:  Destination Therapy - due to limited social support. Pt discussed with Dr. Edwena Blow at [facility] prior to LVAD implant.   LVAD assessment:  Speed: 5300 RPM  Flow: 3.7  Power: 4.1 w  PI: 8.4  Alarms: none  Events: 20-40 daily  Hct: 20   Fixed speed: 5300  Low speed limit: 5000  Primary Controller: Replace back up battery in 13 months.  Back up controller: Replace back up battery in 14 months.  I reviewed the LVAD parameters from today and compared the results to the patient's prior recorded data. LVAD interrogation was STABLE for significant power changes, NEGATIVE for clinical alarms and STABLE for PI events/speed drops. No programming changes were made and pump is functioning within specified parameters. Pt is performing daily controller and system monitor self tests along with completing weekly and  monthly maintenance for LVAD equipment.  LVAD equipment check completed and is in good working order. Back-up equipment present.  Annual maintenance completed on patient's home equipment per [employer] today, 07/23/18.    Review of systems complete and found to be negative unless listed in HPI.   Past Medical History:  Diagnosis Date  . CHF (congestive heart failure) (HCC)   . Diabetes mellitus without complication (HCC)     Current Outpatient Medications  Medication Sig Dispense Refill  . Blood Glucose Monitoring Suppl (TRUE METRIX METER) DEVI 1 each by Does not apply route daily with breakfast. 1 Device 0  . ferrous sulfate 325 (65 FE) MG EC tablet Take 1 tablet (325 mg total) by mouth 2 (two) times daily. 180 tablet 1  . glipiZIDE (GLUCOTROL) 5 MG tablet TAKE 0.5 TABLETS (2.5 MG TOTAL) BY MOUTH 2 (TWO) TIMES DAILY BEFORE A MEAL. MUST MAKE APPT FOR FURTHER REFILLS 30 tablet 0  . glucose blood (TRUE METRIX BLOOD GLUCOSE TEST) test strip Use daily before breakfast 30 each 12  . pantoprazole (PROTONIX) 40 MG tablet Take 1 tablet (40 mg total) by mouth daily. 30 tablet 5  . sacubitril-valsartan (ENTRESTO) 49-51 MG Take 1 tablet by mouth 2 (two) times daily. 60 tablet 4  . TRUEPLUS LANCETS 28G MISC 1 each by Does not apply route daily. 30 each 12  . warfarin (COUMADIN) 6 MG tablet Take 1/2 tablet (3 mg) daily except 1 tablet (6 mg) on Monday and Friday 90 tablet 3  . amLODipine (NORVASC) 5 MG tablet Take 1 tablet (5 mg total) by mouth daily. (Patient not taking: Reported  on 01/21/2019) 90 tablet 3  . aspirin 81 MG EC tablet Take 1 tablet (81 mg total) by mouth daily. (Patient not taking: Reported on 01/21/2019) 90 tablet 3   No current facility-administered medications for this encounter.   Allergies  Allergen Reactions  . Bee Venom     UNSPECIFIED REACTION    Social History   Socioeconomic History  . Marital status: Single    Spouse name: Not on file  . Number of children: Not on file  .  Years of education: Not on file  . Highest education level: Not on file  Occupational History  . Occupation: security  Tobacco Use  . Smoking status: Former Smoker    Types: Cigars    Quit date: 11/16/2016    Years since quitting: 2.1  . Smokeless tobacco: Never Used  . Tobacco comment: 6 cigars per wk  Substance and Sexual Activity  . Alcohol use: Yes    Alcohol/week: 14.0 standard drinks    Types: 12 Cans of beer, 2 Shots of liquor per week    Comment: occ  . Drug use: No  . Sexual activity: Not on file  Other Topics Concern  . Not on file  Social History Narrative  . Not on file   Social Determinants of Health   Financial Resource Strain:   . Difficulty of Paying Living Expenses: Not on file  Food Insecurity:   . Worried About Programme researcher, broadcasting/film/video in the Last Year: Not on file  . Ran Out of Food in the Last Year: Not on file  Transportation Needs:   . Lack of Transportation (Medical): Not on file  . Lack of Transportation (Non-Medical): Not on file  Physical Activity:   . Days of Exercise per Week: Not on file  . Minutes of Exercise per Session: Not on file  Stress:   . Feeling of Stress : Not on file  Social Connections:   . Frequency of Communication with Friends and Family: Not on file  . Frequency of Social Gatherings with Friends and Family: Not on file  . Attends Religious Services: Not on file  . Active Member of Clubs or Organizations: Not on file  . Attends Banker Meetings: Not on file  . Marital Status: Not on file  Intimate Partner Violence:   . Fear of Current or Ex-Partner: Not on file  . Emotionally Abused: Not on file  . Physically Abused: Not on file  . Sexually Abused: Not on file    Family History  Problem Relation Age of Onset  . CAD Father    Vitals:   01/21/19 1025 01/21/19 1027  BP: 122/87 (!) 112/0  Pulse: 80   SpO2: 100%   Weight: 107.2 kg (236 lb 6.4 oz)   Height: 6' (1.829 m)     Wt Readings from Last 3  Encounters:  01/21/19 107.2 kg (236 lb 6.4 oz)  11/19/18 118.9 kg (262 lb 3.2 oz)  10/03/18 116.1 kg (256 lb)     Vital Signs:  Doppler Pressure: 112  Automatc BP: 122/87 (104)  HR: 80  SPO2: 100% on RA  Weight: 263.4 lb w/ eqt  Last weight: 262.2 lb   Physical Exam: General:  NAD.  HEENT: normal x edentulous Neck: supple. JVP not elevated.  Carotids 2+ bilat; no bruits. No lymphadenopathy or thryomegaly appreciated. Cor: LVAD hum.  Lungs: Clear. Abdomen: obese soft, nontender, non-distended. No hepatosplenomegaly. No bruits or masses. Good bowel sounds. Driveline site clean. Anchor  in place.  Extremities: no cyanosis, clubbing, rash. Warm no edema  Neuro: alert & oriented x 3. No focal deficits. Moves all 4 without problem    ASSESSMENT & PLAN:  1. Chronic Combined Systolic/Diastolic Heart Failure. NICM  - ECHO 4/19 EF 20% CMRI EF 22%  - s/p HM-3 LVAD on 05/15/17 - Continues to do well. NYHA I on VAD support - Volume status ok. Does not require diuretic.   2. VAD management Driveline with serous exudate expressed. CX exudate, check CBC, and start keflex 250 mg three times a day for 7 days.  - VAD interrogated personally. Parameters stable. - Still with frequent PI events but no low flows. Continue to remind him to keep up with fluid intake. Currently no clinical significance - LDH 347 -> 491 (no power spikes or dark urine). Has been off ASA - Resume ASA 81 mg daily + coumadin. -  INR 1.7 INR goal 2-2.5.Discussed dosing with PharmD personally. - Repeat BP check and labs (with LDH in 1 week) If LDH > 500 will need admit for bival  3. AFL - Maintaining NSR - Has been off amio. Will not restart for now  4. HTN - Blood pressure up. Restart amlodipine 5. Not using Entresto due likellihood of making PI event worse with diuretic effect - BP check next week  5. Hypothyroidism - TSH mildly elevated 06/2017 at 5.4, but T3 and T4 normal - Continue to follow on amio.    6.  DMII - Per PCP  7. CKD 3 - Creatinine stable 1.6-1.8 - Continue fluid intake  Total time spent 35 minutes. Over half that time spent discussing above.   Glori Bickers, MD 01/25/19

## 2019-01-28 ENCOUNTER — Ambulatory Visit (HOSPITAL_COMMUNITY): Payer: Self-pay | Admitting: Pharmacist

## 2019-01-28 ENCOUNTER — Ambulatory Visit (HOSPITAL_COMMUNITY)
Admission: RE | Admit: 2019-01-28 | Discharge: 2019-01-28 | Disposition: A | Discharge status: Home / Self Care | Payer: Medicaid Other | Source: Ambulatory Visit | Attending: Cardiology | Admitting: Cardiology

## 2019-01-28 ENCOUNTER — Other Ambulatory Visit: Payer: Self-pay

## 2019-01-28 DIAGNOSIS — Z95811 Presence of heart assist device: Secondary | ICD-10-CM

## 2019-01-28 DIAGNOSIS — Z7901 Long term (current) use of anticoagulants: Secondary | ICD-10-CM | POA: Insufficient documentation

## 2019-01-28 LAB — LACTATE DEHYDROGENASE: LDH: 399 U/L — ABNORMAL HIGH (ref 98–192)

## 2019-01-28 LAB — PROTIME-INR
INR: 2.3 — ABNORMAL HIGH (ref 0.8–1.2)
Prothrombin Time: 25.1 seconds — ABNORMAL HIGH (ref 11.4–15.2)

## 2019-01-28 NOTE — Progress Notes (Signed)
LVAD INR 

## 2019-01-28 NOTE — Addendum Note (Signed)
Encounter addended by: Levonne Spiller, RN on: 01/28/2019 1:41 PM  Actions taken: Clinical Note Signed

## 2019-01-28 NOTE — Progress Notes (Signed)
Patient presents to clinic today for drive line exit wound care. Existing VAD dressing removed and site care performed using sterile technique. Drive line exit site cleaned with Chlora prep applicators x 2, allowed to dry, and Sorbaview with bio-patch re-applied. Exit site healed and incorporated, the velour is fully implanted at exit site. No drainage, redness, tenderness,  foul odor or rash noted. Drive line anchor re-applied. Pt denies fever or chills. Pt has adequate dressing supplies.   Return in 1 week for another dressing change per standard of care. INR to be repeated in 1-2 weeks pending results per anticoagulation protocol.   [name] RN VAD Coordinator  Office: [phone]  24/7 Pager: [phone]

## 2019-02-04 ENCOUNTER — Other Ambulatory Visit (HOSPITAL_COMMUNITY): Payer: Medicaid Other

## 2019-02-10 ENCOUNTER — Other Ambulatory Visit: Payer: Self-pay | Admitting: *Deleted

## 2019-02-10 DIAGNOSIS — Z7901 Long term (current) use of anticoagulants: Secondary | ICD-10-CM

## 2019-02-10 DIAGNOSIS — Z95811 Presence of heart assist device: Secondary | ICD-10-CM

## 2019-02-10 NOTE — Telephone Encounter (Signed)
Advanced Heart Failure Patient Advocate Encounter   Patient was approved to receive Entresto from [employer]  Patient ID: [id number] Effective dates: 02/07/19 through 02/07/2020  Unable to leave VM for patient.  Karle Plumber, PharmD, BCPS, BCCP, CPP Heart Failure Clinic Pharmacist [phone]

## 2019-02-13 ENCOUNTER — Other Ambulatory Visit (HOSPITAL_COMMUNITY): Payer: Self-pay | Admitting: *Deleted

## 2019-02-13 DIAGNOSIS — Z7901 Long term (current) use of anticoagulants: Secondary | ICD-10-CM

## 2019-02-13 DIAGNOSIS — Z95811 Presence of heart assist device: Secondary | ICD-10-CM

## 2019-02-18 ENCOUNTER — Ambulatory Visit (HOSPITAL_COMMUNITY): Payer: Self-pay | Admitting: Pharmacist

## 2019-02-18 ENCOUNTER — Other Ambulatory Visit: Payer: Self-pay | Admitting: Physician Assistant

## 2019-02-18 ENCOUNTER — Other Ambulatory Visit: Payer: Self-pay

## 2019-02-18 ENCOUNTER — Ambulatory Visit (HOSPITAL_COMMUNITY)
Admission: RE | Admit: 2019-02-18 | Discharge: 2019-02-18 | Disposition: A | Discharge status: Home / Self Care | Payer: Medicaid Other | Source: Ambulatory Visit | Attending: Cardiology | Admitting: Cardiology

## 2019-02-18 DIAGNOSIS — E1169 Type 2 diabetes mellitus with other specified complication: Secondary | ICD-10-CM

## 2019-02-18 DIAGNOSIS — Z95811 Presence of heart assist device: Secondary | ICD-10-CM | POA: Insufficient documentation

## 2019-02-18 DIAGNOSIS — Z7901 Long term (current) use of anticoagulants: Secondary | ICD-10-CM | POA: Insufficient documentation

## 2019-02-18 LAB — PROTIME-INR
INR: 2.1 — ABNORMAL HIGH (ref 0.8–1.2)
Prothrombin Time: 23.6 seconds — ABNORMAL HIGH (ref 11.4–15.2)

## 2019-02-18 MED FILL — PANTOPRAZOLE SOD DR 40 MG T: 40 | 30 days supply | Qty: 30 | Fill #4

## 2019-02-18 NOTE — Progress Notes (Signed)
LVAD INR 

## 2019-02-18 NOTE — Progress Notes (Signed)
Patient presents to clinic today for drive line exit wound care. Existing VAD dressing removed and site care performed using sterile technique. Drive line exit site cleaned with Chlora prep applicators x 2, allowed to dry, rinsed with sterile saline, and Sorbaview with bio-patch re-applied. Exit site healed and incorporated, the velour is fully implanted at exit site. No drainage, redness, tenderness,  foul odor or rash noted. Drive line anchor re-applied. Pt denies fever or chills. Pt provided with 4 weekly dressing kits and asked to bring one to each clinic visit.   Return in 1 week for another dressing change per standard of care. INR to be repeated in 1-2 weeks pending results per anticoagulation protocol.   [name] RN VAD Coordinator  Office: [phone]  24/7 Pager: [phone]

## 2019-02-18 NOTE — Addendum Note (Signed)
Encounter addended by: Levonne Spiller, RN on: 02/18/2019 10:32 AM  Actions taken: Clinical Note Signed

## 2019-02-21 ENCOUNTER — Other Ambulatory Visit (HOSPITAL_COMMUNITY): Payer: Self-pay | Admitting: Unknown Physician Specialty

## 2019-02-21 ENCOUNTER — Other Ambulatory Visit: Payer: Self-pay | Admitting: Physician Assistant

## 2019-02-21 DIAGNOSIS — Z95811 Presence of heart assist device: Secondary | ICD-10-CM

## 2019-02-21 DIAGNOSIS — E1169 Type 2 diabetes mellitus with other specified complication: Secondary | ICD-10-CM

## 2019-02-21 DIAGNOSIS — Z7901 Long term (current) use of anticoagulants: Secondary | ICD-10-CM

## 2019-02-21 MED ORDER — GLIPIZIDE 5 MG PO TABS: ORAL_TABLET | ORAL | 0 refills | Status: DC

## 2019-02-21 MED FILL — ?GLIPIZIDE 5MG TABLET: 5 | 30 days supply | Qty: 30 | Fill #0

## 2019-02-21 NOTE — Telephone Encounter (Signed)
Advanced Heart Failure Patient Advocate Encounter   Patient was approved to receive Entresto from [employer]   Effective dates: 02/07/19 through 01/16/20

## 2019-02-25 ENCOUNTER — Ambulatory Visit (HOSPITAL_COMMUNITY)
Admission: RE | Admit: 2019-02-25 | Discharge: 2019-02-25 | Disposition: A | Discharge status: Home / Self Care | Payer: Medicaid Other | Source: Ambulatory Visit | Attending: Cardiology | Admitting: Cardiology

## 2019-02-25 ENCOUNTER — Ambulatory Visit (HOSPITAL_COMMUNITY): Payer: Self-pay | Admitting: Pharmacist

## 2019-02-25 ENCOUNTER — Other Ambulatory Visit: Payer: Self-pay

## 2019-02-25 DIAGNOSIS — Z95811 Presence of heart assist device: Secondary | ICD-10-CM

## 2019-02-25 DIAGNOSIS — Z7901 Long term (current) use of anticoagulants: Secondary | ICD-10-CM | POA: Insufficient documentation

## 2019-02-25 LAB — PROTIME-INR
INR: 2 — ABNORMAL HIGH (ref 0.8–1.2)
Prothrombin Time: 22.9 seconds — ABNORMAL HIGH (ref 11.4–15.2)

## 2019-02-25 NOTE — Addendum Note (Signed)
Encounter addended by: Bernita Raisin, RN on: 02/25/2019 10:17 AM  Actions taken: Clinical Note Signed

## 2019-02-25 NOTE — Progress Notes (Signed)
Patient presents to clinic today for drive line exit wound care. Existing VAD dressing removed and site care performed using sterile technique. Drive line exit site cleaned with Chlora prep applicators x 2, allowed to dry, and Sorbaview with bio-patch re-applied. Exit site healed and incorporated, the velour is fully implanted at exit site. No drainage, redness, tenderness,  foul odor or rash noted. Drive line anchor re-applied. Pt denies fever or chills. Pt has adequate dressing supplies.   Return in 1 week for another dressing change per standard of care. INR to be repeated in 1-2 weeks pending results per anticoagulation protocol.   Alyce Pagan RN VAD Coordinator  Office: [phone]  24/7 Pager: [phone]

## 2019-02-25 NOTE — Progress Notes (Signed)
LVAD INR 

## 2019-02-26 ENCOUNTER — Ambulatory Visit: Payer: Self-pay | Attending: Family Medicine | Admitting: Physician Assistant

## 2019-02-26 VITALS — BP 125/90 | HR 87 | Temp 98.1°F | Resp 16 | Ht 73.0 in | Wt 266.0 lb

## 2019-02-26 DIAGNOSIS — E875 Hyperkalemia: Secondary | ICD-10-CM

## 2019-02-26 DIAGNOSIS — Z95811 Presence of heart assist device: Secondary | ICD-10-CM

## 2019-02-26 DIAGNOSIS — E1169 Type 2 diabetes mellitus with other specified complication: Secondary | ICD-10-CM

## 2019-02-26 DIAGNOSIS — N183 Chronic kidney disease, stage 3 unspecified: Secondary | ICD-10-CM

## 2019-02-26 DIAGNOSIS — Z125 Encounter for screening for malignant neoplasm of prostate: Secondary | ICD-10-CM

## 2019-02-26 DIAGNOSIS — Z7901 Long term (current) use of anticoagulants: Secondary | ICD-10-CM

## 2019-02-26 DIAGNOSIS — I5022 Chronic systolic (congestive) heart failure: Secondary | ICD-10-CM

## 2019-02-26 LAB — POCT GLYCOSYLATED HEMOGLOBIN (HGB A1C): Hemoglobin A1C: 6.8 % — AB (ref 4.0–5.6)

## 2019-02-26 LAB — GLUCOSE, POCT (MANUAL RESULT ENTRY): POC Glucose: 181 mg/dl — AB (ref 70–99)

## 2019-02-26 MED ORDER — FERROUS SULFATE 325 (65 FE) MG PO TBEC: 325.0000 mg | DELAYED_RELEASE_TABLET | Freq: Two times a day (BID) | ORAL | 1 refills | Status: DC

## 2019-02-26 MED ORDER — PANTOPRAZOLE SODIUM 40 MG PO TBEC: 40.0000 mg | DELAYED_RELEASE_TABLET | Freq: Every day | ORAL | 5 refills | Status: DC

## 2019-02-26 MED ORDER — ENTRESTO 49-51 MG PO TABS: 1.0000 | ORAL_TABLET | Freq: Two times a day (BID) | ORAL | 4 refills | Status: DC

## 2019-02-26 MED ORDER — GLIPIZIDE 5 MG PO TABS: 5.0000 mg | ORAL_TABLET | Freq: Two times a day (BID) | ORAL | 4 refills | Status: DC

## 2019-02-26 MED ORDER — AMLODIPINE BESYLATE 5 MG PO TABS: 5.0000 mg | ORAL_TABLET | Freq: Every day | ORAL | 3 refills | Status: DC

## 2019-02-26 NOTE — Progress Notes (Signed)
Benjamin Sherman, is a 49 y.o. male  IHK:[id number]  VFI:[id number]  DOB - [birth year]  Subjective:  Chief Complaint and HPI: Benjamin Sherman is a 49 y.o. male here today for diabetes and blood work check.  He does not check his blood sugars regularly.  He is compliant with his meds.  He denies hypoglycemia.  No CP/SOB/HA.  No dizziness.  Goes to coumadin clinic(just went yesterday)   ROS:   Constitutional:  No f/c, No night sweats, No unexplained weight loss. EENT:  No vision changes, No blurry vision, No hearing changes. No mouth, throat, or ear problems.  Respiratory: No cough, No SOB Cardiac: No CP, no palpitations GI:  No abd pain, No N/V/D. GU: No Urinary s/sx Musculoskeletal: No joint pain Neuro: No headache, no dizziness, no motor weakness.  Skin: No rash Endocrine:  No polydipsia. No polyuria.  Psych: Denies SI/HI  No problems updated.  ALLERGIES: Allergies  Allergen Reactions  . Bee Venom     UNSPECIFIED REACTION     PAST MEDICAL HISTORY: Past Medical History:  Diagnosis Date  . CHF (congestive heart failure) (HCC)   . Diabetes mellitus without complication (HCC)     MEDICATIONS AT HOME: Prior to Admission medications   Medication Sig Start Date End Date Taking? Authorizing Provider  amLODipine (NORVASC) 5 MG tablet Take 1 tablet (5 mg total) by mouth daily. 02/26/19  Yes Anders Simmonds, PA-C  aspirin 81 MG EC tablet Take 1 tablet (81 mg total) by mouth daily. 10/03/18  Yes Kimara Bencomo M, PA-C  Blood Glucose Monitoring Suppl (TRUE METRIX METER) DEVI 1 each by Does not apply route daily with breakfast. 02/19/18  Yes Newlin, Enobong, MD  glipiZIDE (GLUCOTROL) 5 MG tablet Take 1 tablet (5 mg total) by mouth 2 (two) times daily before a meal. 02/26/19  Yes Kalum Minner M, PA-C  glucose blood (TRUE METRIX BLOOD GLUCOSE TEST) test strip Use daily before breakfast 02/19/18  Yes Newlin, Enobong, MD  pantoprazole (PROTONIX) 40 MG tablet Take 1 tablet (40 mg total) by  mouth daily. 02/26/19  Yes Neomia Herbel M, PA-C  sacubitril-valsartan (ENTRESTO) 49-51 MG Take 1 tablet by mouth 2 (two) times daily. 02/26/19  Yes Tyrik Stetzer, Marzella Schlein, PA-C  TRUEPLUS LANCETS 28G MISC 1 each by Does not apply route daily. 02/19/18  Yes Hoy Register, MD  warfarin (COUMADIN) 6 MG tablet Take 1/2 tablet (3 mg) daily except 1 tablet (6 mg) on Monday and Friday 05/30/18  Yes Bensimhon, Bevelyn Buckles, MD  ferrous sulfate 325 (65 FE) MG EC tablet Take 1 tablet (325 mg total) by mouth 2 (two) times daily. 02/26/19   Anders Simmonds, PA-C  gabapentin (NEURONTIN) 300 MG capsule TAKE 1 CAPSULE (300 MG TOTAL) BY MOUTH DAILY. 04/23/18 07/23/18  Bensimhon, Bevelyn Buckles, MD     Objective:  EXAM:   Vitals:   02/26/19 1015  BP: 125/90  Pulse: 87  Resp: 16  Temp: 98.1 F (36.7 C)  SpO2: 97%  Weight: 266 lb (120.7 kg)  Height: 6\' 1"  (1.854 m)    General appearance : A&OX3. NAD. Non-toxic-appearing HEENT: Atraumatic and Normocephalic.  PERRLA. EOM intact.   Chest/Lungs:  Breathing-non-labored, Good air entry bilaterally, breath sounds normal without rales, rhonchi, or wheezing  CVS:  LVAD in place and audible Extremities: Bilateral Lower Ext shows no edema, both legs are warm to touch with = pulse throughout Neurology:  CN II-XII grossly intact, Non focal.   Psych:  TP linear. J/I WNL. Normal  speech. Appropriate eye contact and affect.  Skin:  No Rash  Data Review Lab Results  Component Value Date   HGBA1C 6.8 (A) 02/26/2019   HGBA1C 6.0 (A) 10/03/2018   HGBA1C 7.4 (A) 02/19/2018     Assessment & Plan   1. Type 2 diabetes mellitus with other specified complication, without long-term current use of insulin (HCC) Uncontrolled and increase dose - Glucose (CBG) - HgB A1c - glipiZIDE (GLUCOTROL) 5 MG tablet; Take 1 tablet (5 mg total) by mouth 2 (two) times daily before a meal.  Dispense: 60 tablet; Refill: 4  2. 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  Dispense: 30 tablet; Refill: 5  3. LVAD (left ventricular assist device) present (HCC) - sacubitril-valsartan (ENTRESTO) 49-51 MG; Take 1 tablet by mouth 2 (two) times daily.  Dispense: 60 tablet; Refill: 4  4. Chronic systolic CHF (congestive heart failure) (HCC) - amLODipine (NORVASC) 5 MG tablet; Take 1 tablet (5 mg total) by mouth daily.  Dispense: 90 tablet; Refill: 3  5. On warfarin therapy - amLODipine (NORVASC) 5 MG tablet; Take 1 tablet (5 mg total) by mouth daily.  Dispense: 90 tablet; Refill: 3  6. CKD (chronic kidney disease), stage III - amLODipine (NORVASC) 5 MG tablet; Take 1 tablet (5 mg total) by mouth daily.  Dispense: 90 tablet; Refill: 3  7. Screening for prostate cancer - PSA  8. Hyperkalemia - Basic metabolic panel   Patient have been counseled extensively about nutrition and exercise  Return in about 3 months (around 05/26/2019) for PCP.  The patient was given clear instructions to go to ER or return to medical center if symptoms don't improve, worsen or new problems develop. The patient verbalized understanding. The patient was told to call to get lab results if they haven't heard anything in the next week.     [name], PA-C [facility] and [place], [place]   02/26/2019, 10:50 AMPatient ID: Benjamin Sherman, male   DOB: 12/31/1970, 49 y.o.   MRN: [id number]

## 2019-02-26 NOTE — Progress Notes (Signed)
DM f /u  Needs meds refills CBG 181 non fasting A1C 6.8

## 2019-02-27 ENCOUNTER — Other Ambulatory Visit: Payer: Self-pay | Admitting: Physician Assistant

## 2019-02-27 DIAGNOSIS — N1831 Chronic kidney disease, stage 3a: Secondary | ICD-10-CM

## 2019-02-27 LAB — BASIC METABOLIC PANEL
BUN/Creatinine Ratio: 10 (ref 9–20)
BUN: 21 mg/dL (ref 6–24)
CO2: 23 mmol/L (ref 20–29)
Calcium: 9.8 mg/dL (ref 8.7–10.2)
Chloride: 104 mmol/L (ref 96–106)
Creatinine, Ser: 2.01 mg/dL — ABNORMAL HIGH (ref 0.76–1.27)
GFR calc Af Amer: 43 mL/min/{1.73_m2} — ABNORMAL LOW (ref >59)
GFR calc non Af Amer: 38 mL/min/{1.73_m2} — ABNORMAL LOW (ref >59)
Glucose: 121 mg/dL — ABNORMAL HIGH (ref 65–99)
Potassium: 4.6 mmol/L (ref 3.5–5.2)
Sodium: 142 mmol/L (ref 134–144)

## 2019-02-27 LAB — PSA: Prostate Specific Ag, Serum: 0.6 ng/mL (ref 0.0–4.0)

## 2019-02-28 ENCOUNTER — Other Ambulatory Visit (HOSPITAL_COMMUNITY): Payer: Self-pay | Admitting: Unknown Physician Specialty

## 2019-02-28 DIAGNOSIS — Z95811 Presence of heart assist device: Secondary | ICD-10-CM

## 2019-02-28 DIAGNOSIS — Z7901 Long term (current) use of anticoagulants: Secondary | ICD-10-CM

## 2019-03-04 ENCOUNTER — Other Ambulatory Visit: Payer: Self-pay

## 2019-03-04 ENCOUNTER — Ambulatory Visit (HOSPITAL_COMMUNITY): Payer: Self-pay | Admitting: Pharmacist

## 2019-03-04 ENCOUNTER — Ambulatory Visit (HOSPITAL_COMMUNITY)
Admission: RE | Admit: 2019-03-04 | Discharge: 2019-03-04 | Disposition: A | Discharge status: Home / Self Care | Payer: Medicaid Other | Source: Ambulatory Visit | Attending: Cardiology | Admitting: Cardiology

## 2019-03-04 DIAGNOSIS — Z4801 Encounter for change or removal of surgical wound dressing: Secondary | ICD-10-CM | POA: Insufficient documentation

## 2019-03-04 DIAGNOSIS — Z7901 Long term (current) use of anticoagulants: Secondary | ICD-10-CM

## 2019-03-04 DIAGNOSIS — Z95811 Presence of heart assist device: Secondary | ICD-10-CM

## 2019-03-04 LAB — PROTIME-INR
INR: 2.2 — ABNORMAL HIGH (ref 0.8–1.2)
Prothrombin Time: 24.7 seconds — ABNORMAL HIGH (ref 11.4–15.2)

## 2019-03-04 NOTE — Progress Notes (Signed)
Patient presents to clinic today for drive line exit wound care. Existing VAD dressing removed and site care performed using sterile technique. Drive line exit site cleaned with Chlora prep applicators x 2, allowed to dry, and Sorbaview with bio-patch re-applied. Exit site healed and incorporated, the velour is fully implanted at exit site. No drainage, redness, tenderness,  foul odor or rash noted. Drive line anchor re-applied. Pt denies fever or chills. Pt has adequate dressing supplies.   Return in 1 week for another dressing change per standard of care. INR to be repeated in 1-2 weeks pending results per anticoagulation protocol.   [name] RN VAD Coordinator  Office: [phone]  24/7 Pager: [phone]

## 2019-03-04 NOTE — Progress Notes (Signed)
LVAD INR 

## 2019-03-11 ENCOUNTER — Other Ambulatory Visit (HOSPITAL_COMMUNITY): Payer: Self-pay | Admitting: *Deleted

## 2019-03-11 DIAGNOSIS — Z95811 Presence of heart assist device: Secondary | ICD-10-CM

## 2019-03-11 DIAGNOSIS — Z7901 Long term (current) use of anticoagulants: Secondary | ICD-10-CM

## 2019-03-12 ENCOUNTER — Other Ambulatory Visit (HOSPITAL_COMMUNITY): Payer: Medicaid Other

## 2019-03-14 ENCOUNTER — Telehealth (HOSPITAL_COMMUNITY): Payer: Self-pay | Admitting: Licensed Clinical Social Worker

## 2019-03-14 ENCOUNTER — Other Ambulatory Visit (HOSPITAL_COMMUNITY): Payer: Self-pay | Admitting: Unknown Physician Specialty

## 2019-03-14 DIAGNOSIS — Z95811 Presence of heart assist device: Secondary | ICD-10-CM

## 2019-03-14 NOTE — Telephone Encounter (Signed)
Pt reached out to VAD team to report that he had not received his Entresto from [employer]- reports he got a message from them that it was being shipped but was delayed by the weather.  CSW called [employer] with patient on the line- they do not see that a shipment has been sent so they placed order after confirming shipment with patient- anticipate medication arriving on Tuesday next week (3/2)  CSW will continue to follow and assist as needed  Burna Sis, LCSW Clinical Social Worker Advanced Heart Failure Clinic Desk#: [phone] Cell#: [phone]

## 2019-03-19 ENCOUNTER — Other Ambulatory Visit: Payer: Self-pay

## 2019-03-19 ENCOUNTER — Ambulatory Visit (HOSPITAL_COMMUNITY): Payer: Self-pay | Admitting: Pharmacist

## 2019-03-19 ENCOUNTER — Other Ambulatory Visit (HOSPITAL_COMMUNITY): Payer: Self-pay | Admitting: *Deleted

## 2019-03-19 ENCOUNTER — Ambulatory Visit (HOSPITAL_COMMUNITY)
Admission: RE | Admit: 2019-03-19 | Discharge: 2019-03-19 | Disposition: A | Discharge status: Home / Self Care | Payer: Medicaid Other | Source: Ambulatory Visit | Attending: Cardiology | Admitting: Cardiology

## 2019-03-19 DIAGNOSIS — Z7901 Long term (current) use of anticoagulants: Secondary | ICD-10-CM

## 2019-03-19 DIAGNOSIS — Z95811 Presence of heart assist device: Secondary | ICD-10-CM

## 2019-03-19 DIAGNOSIS — Z5181 Encounter for therapeutic drug level monitoring: Secondary | ICD-10-CM | POA: Insufficient documentation

## 2019-03-19 LAB — PROTIME-INR
INR: 1.7 — ABNORMAL HIGH (ref 0.8–1.2)
Prothrombin Time: 19.8 seconds — ABNORMAL HIGH (ref 11.4–15.2)

## 2019-03-19 NOTE — Progress Notes (Signed)
Patient presents to clinic today for drive line exit wound care. Existing VAD dressing removed and site care performed using sterile technique. Drive line exit site cleaned with Chlora prep applicators x 2, allowed to dry, and Sorbaview with bio-patch re-applied. Exit site healed and incorporated, the velour is fully implanted at exit site. No drainage, redness, tenderness,  foul odor or rash noted. Drive line anchor re-applied. Pt denies fever or chills. Pt provided with 4 dressing kits for home use. Instructed to bring kit with him to clinic for weekly dressing change if needed.   Return in 1 week for another dressing change per standard of care. INR to be repeated in 1-2 weeks pending results per anticoagulation protocol.   Emerson Monte RN [place] Coordinator  Office: [phone]  24/7 Pager: [phone]

## 2019-03-19 NOTE — Progress Notes (Signed)
LVAD INR 

## 2019-03-19 NOTE — Addendum Note (Signed)
Encounter addended by: Bernita Raisin, RN on: 03/19/2019 9:11 AM  Actions taken: Clinical Note Signed

## 2019-03-20 ENCOUNTER — Other Ambulatory Visit (HOSPITAL_COMMUNITY): Payer: Self-pay | Admitting: Unknown Physician Specialty

## 2019-03-20 DIAGNOSIS — Z95811 Presence of heart assist device: Secondary | ICD-10-CM

## 2019-03-20 DIAGNOSIS — Z7901 Long term (current) use of anticoagulants: Secondary | ICD-10-CM

## 2019-03-24 ENCOUNTER — Other Ambulatory Visit: Payer: Self-pay

## 2019-03-24 ENCOUNTER — Ambulatory Visit (HOSPITAL_COMMUNITY)
Admission: RE | Admit: 2019-03-24 | Discharge: 2019-03-24 | Disposition: A | Discharge status: Home / Self Care | Payer: Medicaid Other | Source: Ambulatory Visit | Attending: Cardiology | Admitting: Cardiology

## 2019-03-24 ENCOUNTER — Ambulatory Visit (HOSPITAL_COMMUNITY): Payer: Self-pay | Admitting: Pharmacist

## 2019-03-24 VITALS — BP 112/0 | HR 88 | Ht 72.0 in | Wt 269.8 lb

## 2019-03-24 DIAGNOSIS — I10 Essential (primary) hypertension: Secondary | ICD-10-CM

## 2019-03-24 DIAGNOSIS — N183 Chronic kidney disease, stage 3 unspecified: Secondary | ICD-10-CM | POA: Insufficient documentation

## 2019-03-24 DIAGNOSIS — Z79899 Other long term (current) drug therapy: Secondary | ICD-10-CM | POA: Insufficient documentation

## 2019-03-24 DIAGNOSIS — Z8249 Family history of ischemic heart disease and other diseases of the circulatory system: Secondary | ICD-10-CM | POA: Insufficient documentation

## 2019-03-24 DIAGNOSIS — E039 Hypothyroidism, unspecified: Secondary | ICD-10-CM | POA: Insufficient documentation

## 2019-03-24 DIAGNOSIS — E1122 Type 2 diabetes mellitus with diabetic chronic kidney disease: Secondary | ICD-10-CM | POA: Insufficient documentation

## 2019-03-24 DIAGNOSIS — Z95811 Presence of heart assist device: Secondary | ICD-10-CM | POA: Insufficient documentation

## 2019-03-24 DIAGNOSIS — I5042 Chronic combined systolic (congestive) and diastolic (congestive) heart failure: Secondary | ICD-10-CM | POA: Insufficient documentation

## 2019-03-24 DIAGNOSIS — Z7901 Long term (current) use of anticoagulants: Secondary | ICD-10-CM

## 2019-03-24 DIAGNOSIS — Z7982 Long term (current) use of aspirin: Secondary | ICD-10-CM | POA: Insufficient documentation

## 2019-03-24 DIAGNOSIS — Z7984 Long term (current) use of oral hypoglycemic drugs: Secondary | ICD-10-CM | POA: Insufficient documentation

## 2019-03-24 DIAGNOSIS — Z87891 Personal history of nicotine dependence: Secondary | ICD-10-CM | POA: Insufficient documentation

## 2019-03-24 DIAGNOSIS — I130 Hypertensive heart and chronic kidney disease with heart failure and stage 1 through stage 4 chronic kidney disease, or unspecified chronic kidney disease: Secondary | ICD-10-CM | POA: Insufficient documentation

## 2019-03-24 DIAGNOSIS — I428 Other cardiomyopathies: Secondary | ICD-10-CM | POA: Insufficient documentation

## 2019-03-24 DIAGNOSIS — I5022 Chronic systolic (congestive) heart failure: Secondary | ICD-10-CM

## 2019-03-24 LAB — CBC
HCT: 45.1 % (ref 39.0–52.0)
Hemoglobin: 15.3 g/dL (ref 13.0–17.0)
MCH: 30.9 pg (ref 26.0–34.0)
MCHC: 33.9 g/dL (ref 30.0–36.0)
MCV: 91.1 fL (ref 80.0–100.0)
Platelets: 259 10*3/uL (ref 150–400)
RBC: 4.95 MIL/uL (ref 4.22–5.81)
RDW: 15.1 % (ref 11.5–15.5)
WBC: 7.1 10*3/uL (ref 4.0–10.5)
nRBC: 0 % (ref 0.0–0.2)

## 2019-03-24 LAB — PROTIME-INR
INR: 2.5 — ABNORMAL HIGH (ref 0.8–1.2)
Prothrombin Time: 26.6 seconds — ABNORMAL HIGH (ref 11.4–15.2)

## 2019-03-24 MED ORDER — AMLODIPINE BESYLATE 5 MG PO TABS: 10.0000 mg | ORAL_TABLET | Freq: Every day | ORAL | 3 refills | Status: DC

## 2019-03-24 NOTE — Progress Notes (Signed)
LVAD Clinic Note   Primary Cardiologist: Dr. Gala Romney   HPI: Benjamin Sherman is a 49 y.o. 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 ECHO showed severely reduced EF. 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 Placed on steroids without benefit.  Admitted 11/18 after fall/syncopal episode resulting in bifrontal SAH. Neurosurgery consulted.   Admitted 4/19 with cardiogenic shock in setting of AFL. Supported with inotropes and underwent DC-CV but had persistent shock. After numerous discussions about concern for adequate social support underwent placement of HM-3 LVAD on 05/15/17. D/c home 06/04/17  Patient presents for 2 month follow up in VAD Clinic today. Feeling very well. Playing golf regularly and walking 40 minutes at the mall 44-5x per week. Denies SOB. No edema. Reports his controller has been beeping regularly while on batteries. Denies orthopnea or PND. No fevers, chills or problems with driveline. No bleeding, melena or neuro symptoms. No VAD alarms. Taking all meds as prescribed.     VAD Indication:  Destination Therapy - due to limited social support. Pt discussed with Dr. Edwena Blow at [facility] prior to LVAD implant.  LVAD assessment:  Speed: 5300 RPM  Flow: 3.7  Power: 4.0 w  PI: 8.1  Alarms: none  Events: 70+ for yesterday; 30 for today  Hct: 20   Fixed speed: 5300  Low speed limit: 5000  Primary Controller: Replace back up battery in 13 months. Replaced controller today w/ [id number]. Replace back battery in 32 months  Back up controller: Replace back up battery in 14 months.    Review of systems complete and found to be negative unless listed in HPI.   Past Medical History:  Diagnosis Date  . CHF (congestive heart failure) (HCC)   . Diabetes mellitus without complication (HCC)     Current  Outpatient Medications  Medication Sig Dispense Refill  . amLODipine (NORVASC) 5 MG tablet Take 2 tablets (10 mg total) by mouth daily. 90 tablet 3  . aspirin 81 MG EC tablet Take 1 tablet (81 mg total) by mouth daily. 90 tablet 3  . Blood Glucose Monitoring Suppl (TRUE METRIX METER) DEVI 1 each by Does not apply route daily with breakfast. 1 Device 0  . ferrous sulfate 325 (65 FE) MG EC tablet Take 1 tablet (325 mg total) by mouth 2 (two) times daily. 180 tablet 1  . glipiZIDE (GLUCOTROL) 5 MG tablet Take 1 tablet (5 mg total) by mouth 2 (two) times daily before a meal. 60 tablet 4  . glucose blood (TRUE METRIX BLOOD GLUCOSE TEST) test strip Use daily before breakfast 30 each 12  . pantoprazole (PROTONIX) 40 MG tablet Take 1 tablet (40 mg total) by mouth daily. 30 tablet 5  . sacubitril-valsartan (ENTRESTO) 49-51 MG Take 1 tablet by mouth 2 (two) times daily. 60 tablet 4  . TRUEPLUS LANCETS 28G MISC 1 each by Does not apply route daily. 30 each 12  . warfarin (COUMADIN) 6 MG tablet Take 1/2 tablet (3 mg) daily except 1 tablet (6 mg) on Monday and Friday 90 tablet 3   No current facility-administered medications for this encounter.   Allergies  Allergen Reactions  . Bee Venom     UNSPECIFIED REACTION    Social History   Socioeconomic History  . Marital status: Single    Spouse name: Not on file  . Number of children: Not on  file  . Years of education: Not on file  . Highest education level: Not on file  Occupational History  . Occupation: security  Tobacco Use  . Smoking status: Former Smoker    Types: Cigars    Quit date: 11/16/2016    Years since quitting: 2.3  . Smokeless tobacco: Never Used  . Tobacco comment: 6 cigars per wk  Substance and Sexual Activity  . Alcohol use: Yes    Alcohol/week: 4.0 standard drinks    Types: 2 Shots of liquor, 2 Cans of beer per week    Comment: ocasional  . Drug use: No  . Sexual activity: Not Currently  Other Topics Concern  . Not on  file  Social History Narrative  . Not on file   Social Determinants of Health   Financial Resource Strain:   . Difficulty of Paying Living Expenses: Not on file  Food Insecurity:   . Worried About Charity fundraiser in the Last Year: Not on file  . Ran Out of Food in the Last Year: Not on file  Transportation Needs:   . Lack of Transportation (Medical): Not on file  . Lack of Transportation (Non-Medical): Not on file  Physical Activity:   . Days of Exercise per Week: Not on file  . Minutes of Exercise per Session: Not on file  Stress:   . Feeling of Stress : Not on file  Social Connections:   . Frequency of Communication with Friends and Family: Not on file  . Frequency of Social Gatherings with Friends and Family: Not on file  . Attends Religious Services: Not on file  . Active Member of Clubs or Organizations: Not on file  . Attends Archivist Meetings: Not on file  . Marital Status: Not on file  Intimate Partner Violence:   . Fear of Current or Ex-Partner: Not on file  . Emotionally Abused: Not on file  . Physically Abused: Not on file  . Sexually Abused: Not on file    Family History  Problem Relation Age of Onset  . CAD Father    Vitals:   03/24/19 0957 03/24/19 0958  BP: 114/89 (!) 112/0  Pulse: 88   SpO2: 98%   Weight: 122.4 kg (269 lb 12.8 oz)   Height: 6' (1.829 m)     Wt Readings from Last 3 Encounters:  03/24/19 122.4 kg (269 lb 12.8 oz)  02/26/19 120.7 kg (266 lb)  01/21/19 107.2 kg (236 lb 6.4 oz)     Vital Signs:  Doppler Pressure: 112  Automatc BP: 114/89 (99)  HR: 88  SPO2: 98% on RA  Weight: 269.8 lb w/ eqt  Last weight: 263.4 lb   Physical Exam: General:  NAD.  HEENT: normal  Neck: supple. JVP not elevated.  Carotids 2+ bilat; no bruits. No lymphadenopathy or thryomegaly appreciated. Cor: LVAD hum.  Lungs: Clear. Abdomen: obese soft, nontender, non-distended. No hepatosplenomegaly. No bruits or masses. Good bowel sounds.  Driveline site clean. Anchor in place.  Extremities: no cyanosis, clubbing, rash. Warm no edema  Neuro: alert & oriented x 3. No focal deficits. Moves all 4 without problem    ASSESSMENT & PLAN:  1. Chronic Combined Systolic/Diastolic Heart Failure. NICM  - ECHO 4/19 EF 20% CMRI EF 22%  - s/p HM-3 LVAD on 05/15/17 - Continues to do well on VAD support. NYHA I - Volume status ok. Does not require diuretic.   2. VAD management Driveline with serous exudate expressed. CX exudate,  check CBC, and start keflex 250 mg three times a day for 7 days.  - VAD interrogated personally. Parameters stable. - Still with frequent PI events but no low flows. Continue to remind him to keep up with fluid intake. Currently no clinical significance - LDH 347 -> 491 (no power spikes or dark urine). Has been off ASA - Remains on ASA 81 mg daily + coumadin. -  INR 1.7 INR goal 2.0-2.5. Personally reviewed - Controller and clips changed today in clinic due to beeping. No improvement. Called and d/w [employer] and says this is frequent problem. We have downloaded log files and sent to them. Wattage on line > 14 suggests adequate functioning of cable.   3. AFL - Maintaining NSR - Has been off amio.  4. HTN - Blood pressure remains up. Increase amlodipine to 10. Continue Entresto at [phone]. Will not increase now given frequent PI events and risk of increasing with increased volume removal  5. Hypothyroidism - improved off amio - recheck TFTs at next visit.    6. DMII - Per PCP  7. CKD 3 - Creatinine stable 1.6-1.8 - Continue fluid intake  Total time spent 35 minutes. Over half that time spent discussing above.   [name], MD 03/24/19

## 2019-03-24 NOTE — Progress Notes (Signed)
LVAD INR 

## 2019-03-24 NOTE — Progress Notes (Signed)
Patient presents for 2 month follow up in VAD Clinic today. Reports no problems with VAD equipment or concerns with drive line.   Pt says he has no limitations to daily activities. He went golfing Friday and Saturday. He has also been going to the mall and walking for 40 minutes 4-5 times per week. Denies shortness of breath with activity. He is observing Covid precautions.   Pt states that he restarted his amlopidine and ASA and has stopped his amiodarone as instructed last visit. MAP is still slightly high today.   Pts controller is "chirping" while on batteries. VAD coordinator heard the "beep" as well. Clips changed. Intermittent beeps continue. Controller changed. The beeping continued while the pt was in clinic with the new controller and batteries. There is no evidence of this beeping on interrogation of VAD. I spoke with Nadine Counts and Annabelle Harman at [place] who recommend sending log files. This was completed with no apparent issues, pt was instructed to not change the controller due to intermittent beeping. The issue is basically unresolved, [employer] states they are receiving at least 1 call a day about intermittent beeping from [facility] controllers.  Vital Signs:        Doppler Pressure: 112 Automatc BP: 114/89 (99)    HR: 88      SPO2: 98% on RA       Weight: 269.8 lb w/ eqt  Last weight: 263.4 lb  VAD Indication: Destination Therapy - due to limited social support. Pt discussed with Dr. Edwena Blow at [facility] prior to LVAD implant.  LVAD assessment:      Speed: 5300 RPM      Flow: 3.7       Power: 4.0 w       PI: 8.1       Alarms: none  Events: 70+ for yesterday; 30 for today Hct: 20       Fixed speed: 5300 Low speed limit: 5000  Primary Controller: Replace back up battery in 13 months. Replaced controller today w/ [id number]. Replace back battery in 32 months Back up controller: Replace back up battery in 14 months.  I reviewed the LVAD parameters from today and compared the results to the  patient's prior recorded data. LVAD interrogation was STABLE for significant power changes, NEGATIVE for clinical alarms and STABLE for PI events/speed drops. No programming changes were made and pump is functioning within specified parameters. Pt is performing daily controller and system monitor self tests along with completing weekly and monthly maintenance for LVAD equipment.  LVAD equipment check completed and is in good working order. Back-up equipment present.   Annual maintenance completed on patient's home equipment per [employer] today, 07/23/18.  Exit Site Care: Gene is doing weekly dressing change on Thursdays. Existing VAD dressing removed and site care performed using sterile technique. Drive line exit site cleaned with Chlora prep applicators x 2, allowed to dry, and gauze dressing applied. Exit site healed and incorporated, the velour is fully implanted at exit site. Scatnt amount of thin yellow drainage noted on gauze. No redness, tenderness, foul odor or rash noted. Drive line anchor re-applied. Pt denies fever or chills.  pt  has adequate dressing supplies at home.   Device:  N/A  BP & Labs:  Doppler 112 - is reflecting modified systolic  Hgb 15.1 - No S/S of bleeding. Specifically denies melena/BRBPR or nosebleeds.  LDH hemolyzed - and is within established baseline of 200 - 400. Denies tea-colored urine. No power elevations noted on interrogation other  than above noted incidents. Will add a LDH to his labs for next week.  Patient Instructions:  1. Increase Norvasc to 10 mg daily 2. Return to [place] clinic in 1 week for dressing change and INR/LDH and BMET 3. Return to [place] clinic in 2 months for full VAD clinic appointment  Tanda Rockers RN [place] Coordinator  Office: [phone]  24/7 Pager: [phone]

## 2019-03-24 NOTE — Patient Instructions (Signed)
1. Increase Amlodipine to 10 mg daily (2 tablets) 2. Return to clinic next week for dressing change. 3. Return to clinic in 2 months to see Dr. Jesusita Oka

## 2019-03-26 ENCOUNTER — Other Ambulatory Visit (HOSPITAL_COMMUNITY): Payer: Medicaid Other

## 2019-03-27 ENCOUNTER — Other Ambulatory Visit (HOSPITAL_COMMUNITY): Payer: Self-pay | Admitting: *Deleted

## 2019-03-27 DIAGNOSIS — Z7901 Long term (current) use of anticoagulants: Secondary | ICD-10-CM

## 2019-03-27 DIAGNOSIS — Z95811 Presence of heart assist device: Secondary | ICD-10-CM

## 2019-04-02 ENCOUNTER — Ambulatory Visit (HOSPITAL_COMMUNITY)
Admission: RE | Admit: 2019-04-02 | Discharge: 2019-04-02 | Disposition: A | Discharge status: Home / Self Care | Payer: Medicaid Other | Source: Ambulatory Visit | Attending: Internal Medicine | Admitting: Internal Medicine

## 2019-04-02 ENCOUNTER — Other Ambulatory Visit: Payer: Self-pay

## 2019-04-02 ENCOUNTER — Ambulatory Visit (HOSPITAL_COMMUNITY): Payer: Self-pay | Admitting: Pharmacist

## 2019-04-02 DIAGNOSIS — Z7901 Long term (current) use of anticoagulants: Secondary | ICD-10-CM | POA: Insufficient documentation

## 2019-04-02 DIAGNOSIS — Z95811 Presence of heart assist device: Secondary | ICD-10-CM | POA: Insufficient documentation

## 2019-04-02 LAB — BASIC METABOLIC PANEL
Anion gap: 11 (ref 5–15)
BUN: 20 mg/dL (ref 6–20)
CO2: 24 mmol/L (ref 22–32)
Calcium: 9.2 mg/dL (ref 8.9–10.3)
Chloride: 100 mmol/L (ref 98–111)
Creatinine, Ser: 1.79 mg/dL — ABNORMAL HIGH (ref 0.61–1.24)
GFR calc Af Amer: 50 mL/min — ABNORMAL LOW (ref >60)
GFR calc non Af Amer: 43 mL/min — ABNORMAL LOW (ref >60)
Glucose, Bld: 165 mg/dL — ABNORMAL HIGH (ref 70–99)
Potassium: 5.6 mmol/L — ABNORMAL HIGH (ref 3.5–5.1)
Sodium: 135 mmol/L (ref 135–145)

## 2019-04-02 LAB — TSH: TSH: 4.523 u[IU]/mL — ABNORMAL HIGH (ref 0.350–4.500)

## 2019-04-02 LAB — PROTIME-INR
INR: 2.1 — ABNORMAL HIGH (ref 0.8–1.2)
Prothrombin Time: 23 seconds — ABNORMAL HIGH (ref 11.4–15.2)

## 2019-04-02 LAB — LACTATE DEHYDROGENASE: LDH: 418 U/L — ABNORMAL HIGH (ref 98–192)

## 2019-04-02 NOTE — Progress Notes (Signed)
LVAD INR 

## 2019-04-02 NOTE — Progress Notes (Addendum)
Patient presents to clinic today for drive line exit wound care. Existing VAD dressing removed and site care performed using sterile technique. Drive line exit site cleaned with Chlora prep applicators x 2, allowed to dry, and Sorbaview with bio-patch re-applied. Exit site healed and incorporated, the velour is fully implanted at exit site. No drainage, redness, tenderness,  foul odor or rash noted. Drive line anchor re-applied. Pt denies fever or chills. Pt provided with 4 dressing kits for home use. Instructed to bring kit with him to clinic for weekly dressing change if needed.   Pt had labs drawn today. K+ 5.6 LDH is 418 from 399 no evidence of hemolysis. Pt informed not to use any salt substitutes such as Mrs. Dash. Pt agrees. We will recheck BMET and LDH w/INR next week per Dr. Haroldine Laws. TSH 4.523 T4 pending.  Return in 1 week for another dressing change per standard of care. INR to be repeated in 1-2 weeks pending results per anticoagulation protocol.   Tanda Rockers RN [place] Coordinator  Office: [phone]  24/7 Pager: [phone]

## 2019-04-02 NOTE — Addendum Note (Signed)
Encounter addended by: Lebron Quam, RN on: 04/02/2019 1:07 PM  Actions taken: Clinical Note Signed

## 2019-04-02 NOTE — Addendum Note (Signed)
Encounter addended by: Lebron Quam, RN on: 04/02/2019 9:40 AM  Actions taken: Clinical Note Signed

## 2019-04-03 ENCOUNTER — Other Ambulatory Visit (HOSPITAL_COMMUNITY): Payer: Self-pay | Admitting: *Deleted

## 2019-04-03 DIAGNOSIS — Z7901 Long term (current) use of anticoagulants: Secondary | ICD-10-CM

## 2019-04-03 DIAGNOSIS — Z95811 Presence of heart assist device: Secondary | ICD-10-CM

## 2019-04-03 LAB — T4: T4, Total: 6.4 ug/dL (ref 4.5–12.0)

## 2019-04-09 ENCOUNTER — Other Ambulatory Visit (HOSPITAL_COMMUNITY): Payer: Medicaid Other

## 2019-04-16 ENCOUNTER — Other Ambulatory Visit: Payer: Self-pay

## 2019-04-16 ENCOUNTER — Ambulatory Visit (HOSPITAL_COMMUNITY): Payer: Self-pay | Admitting: Pharmacist

## 2019-04-16 ENCOUNTER — Ambulatory Visit (HOSPITAL_COMMUNITY)
Admission: RE | Admit: 2019-04-16 | Discharge: 2019-04-16 | Disposition: A | Discharge status: Home / Self Care | Payer: Medicaid Other | Source: Ambulatory Visit | Attending: Internal Medicine | Admitting: Internal Medicine

## 2019-04-16 DIAGNOSIS — Z7901 Long term (current) use of anticoagulants: Secondary | ICD-10-CM

## 2019-04-16 DIAGNOSIS — Z95811 Presence of heart assist device: Secondary | ICD-10-CM

## 2019-04-16 LAB — BASIC METABOLIC PANEL
Anion gap: 11 (ref 5–15)
BUN: 18 mg/dL (ref 6–20)
CO2: 24 mmol/L (ref 22–32)
Calcium: 9.1 mg/dL (ref 8.9–10.3)
Chloride: 102 mmol/L (ref 98–111)
Creatinine, Ser: 1.65 mg/dL — ABNORMAL HIGH (ref 0.61–1.24)
GFR calc Af Amer: 55 mL/min — ABNORMAL LOW (ref >60)
GFR calc non Af Amer: 48 mL/min — ABNORMAL LOW (ref >60)
Glucose, Bld: 122 mg/dL — ABNORMAL HIGH (ref 70–99)
Potassium: 4.2 mmol/L (ref 3.5–5.1)
Sodium: 137 mmol/L (ref 135–145)

## 2019-04-16 LAB — PROTIME-INR
INR: 1.8 — ABNORMAL HIGH (ref 0.8–1.2)
Prothrombin Time: 20.9 seconds — ABNORMAL HIGH (ref 11.4–15.2)

## 2019-04-16 LAB — LACTATE DEHYDROGENASE: LDH: 228 U/L — ABNORMAL HIGH (ref 98–192)

## 2019-04-16 NOTE — Progress Notes (Signed)
LVAD INR 

## 2019-04-16 NOTE — Progress Notes (Signed)
Patient presents to clinic today for drive line exit wound care. Existing VAD dressing removed and site care performed using sterile technique. Drive line exit site cleaned with Chlora prep applicators x 2, allowed to dry, and Sorbaview with bio-patch re-applied. Exit site healed and incorporated, the velour is fully implanted at exit site. No drainage, redness, tenderness,  foul odor or rash noted. Drive line anchor re-applied. Pt denies fever or chills.   Return in 1 week for another dressing change per standard of care. INR to be repeated in 1-2 weeks pending results per anticoagulation protocol.   [name] RN VAD Coordinator  Office: [phone]  24/7 Pager: [phone]

## 2019-04-16 NOTE — Addendum Note (Signed)
Encounter addended by: Levonne Spiller, RN on: 04/16/2019 9:25 AM  Actions taken: Clinical Note Signed

## 2019-04-17 ENCOUNTER — Other Ambulatory Visit (HOSPITAL_COMMUNITY): Payer: Self-pay | Admitting: *Deleted

## 2019-04-17 DIAGNOSIS — Z95811 Presence of heart assist device: Secondary | ICD-10-CM

## 2019-04-17 DIAGNOSIS — Z7901 Long term (current) use of anticoagulants: Secondary | ICD-10-CM

## 2019-04-23 ENCOUNTER — Other Ambulatory Visit (HOSPITAL_COMMUNITY): Payer: Medicaid Other

## 2019-04-25 ENCOUNTER — Other Ambulatory Visit (HOSPITAL_COMMUNITY): Payer: Self-pay | Admitting: *Deleted

## 2019-04-25 DIAGNOSIS — Z95811 Presence of heart assist device: Secondary | ICD-10-CM

## 2019-04-25 DIAGNOSIS — Z7901 Long term (current) use of anticoagulants: Secondary | ICD-10-CM

## 2019-04-30 ENCOUNTER — Ambulatory Visit (HOSPITAL_COMMUNITY)
Admission: RE | Admit: 2019-04-30 | Discharge: 2019-04-30 | Disposition: A | Discharge status: Home / Self Care | Payer: Medicare Other | Source: Ambulatory Visit | Attending: Cardiology | Admitting: Cardiology

## 2019-04-30 ENCOUNTER — Other Ambulatory Visit: Payer: Self-pay

## 2019-04-30 ENCOUNTER — Ambulatory Visit (HOSPITAL_COMMUNITY): Payer: Self-pay | Admitting: Pharmacist

## 2019-04-30 DIAGNOSIS — Z7901 Long term (current) use of anticoagulants: Secondary | ICD-10-CM | POA: Diagnosis not present

## 2019-04-30 DIAGNOSIS — Z4801 Encounter for change or removal of surgical wound dressing: Secondary | ICD-10-CM | POA: Insufficient documentation

## 2019-04-30 DIAGNOSIS — Z95811 Presence of heart assist device: Secondary | ICD-10-CM | POA: Diagnosis not present

## 2019-04-30 LAB — PROTIME-INR
INR: 2 — ABNORMAL HIGH (ref 0.8–1.2)
Prothrombin Time: 22.9 seconds — ABNORMAL HIGH (ref 11.4–15.2)

## 2019-04-30 NOTE — Progress Notes (Signed)
Patient presents to clinic today for drive line exit wound care. Existing VAD dressing removed and site care performed using sterile technique. Drive line exit site cleaned with Chlora prep applicators x 2, allowed to dry, and Sorbaview with bio-patch re-applied. Exit site healed and incorporated, the velour is fully implanted at exit site. No drainage, redness, tenderness,  foul odor or rash noted. Drive line anchor re-applied. Pt denies fever or chills.   Return in 1 week for another dressing change per standard of care. INR to be repeated in 1-2 weeks pending results per anticoagulation protocol.   Alyce Pagan RN VAD Coordinator  Office: [phone]  24/7 Pager: [phone]

## 2019-04-30 NOTE — Addendum Note (Signed)
Encounter addended by: Bernita Raisin, RN on: 04/30/2019 10:12 AM  Actions taken: Clinical Note Signed

## 2019-04-30 NOTE — Progress Notes (Signed)
LVAD INR 

## 2019-05-07 ENCOUNTER — Other Ambulatory Visit (HOSPITAL_COMMUNITY): Payer: Medicare Other

## 2019-05-19 ENCOUNTER — Other Ambulatory Visit (HOSPITAL_COMMUNITY): Payer: Self-pay | Admitting: *Deleted

## 2019-05-19 DIAGNOSIS — Z7901 Long term (current) use of anticoagulants: Secondary | ICD-10-CM

## 2019-05-19 DIAGNOSIS — Z95811 Presence of heart assist device: Secondary | ICD-10-CM

## 2019-05-20 ENCOUNTER — Ambulatory Visit (HOSPITAL_COMMUNITY): Payer: Self-pay | Admitting: Pharmacist

## 2019-05-20 ENCOUNTER — Other Ambulatory Visit: Payer: Self-pay

## 2019-05-20 ENCOUNTER — Ambulatory Visit (HOSPITAL_COMMUNITY)
Admission: RE | Admit: 2019-05-20 | Discharge: 2019-05-20 | Disposition: A | Discharge status: Home / Self Care | Payer: Medicare Other | Source: Ambulatory Visit | Attending: Internal Medicine | Admitting: Internal Medicine

## 2019-05-20 DIAGNOSIS — Z7901 Long term (current) use of anticoagulants: Secondary | ICD-10-CM | POA: Insufficient documentation

## 2019-05-20 DIAGNOSIS — Z95811 Presence of heart assist device: Secondary | ICD-10-CM | POA: Diagnosis present

## 2019-05-20 LAB — PROTIME-INR
INR: 1.6 — ABNORMAL HIGH (ref 0.8–1.2)
Prothrombin Time: 18.9 seconds — ABNORMAL HIGH (ref 11.4–15.2)

## 2019-05-20 NOTE — Progress Notes (Signed)
Patient presents to clinic today for drive line exit wound care. Existing VAD dressing removed and site care performed using sterile technique. Drive line exit site cleaned with Chlora prep applicators x 2, allowed to dry, and Sorbaview with bio-patch re-applied. Exit site healed and partially incorporated, the velour is fully implanted at exit site. No drainage, redness, tenderness,  foul odor or rash noted. Drive line anchor re-applied. Pt denies fever or chills.   Return in 1 week for a full visit w/DR. Bensimhon.  [name] RN VAD Coordinator  Office: [phone]  24/7 Pager: [phone]

## 2019-05-20 NOTE — Progress Notes (Signed)
LVAD INR 

## 2019-05-20 NOTE — Addendum Note (Signed)
Encounter addended by: Lebron Quam, RN on: 05/20/2019 11:00 AM  Actions taken: Clinical Note Signed

## 2019-05-23 ENCOUNTER — Other Ambulatory Visit (HOSPITAL_COMMUNITY): Payer: Self-pay | Admitting: Unknown Physician Specialty

## 2019-05-23 DIAGNOSIS — Z7901 Long term (current) use of anticoagulants: Secondary | ICD-10-CM

## 2019-05-23 DIAGNOSIS — Z95811 Presence of heart assist device: Secondary | ICD-10-CM

## 2019-05-23 NOTE — Addendum Note (Signed)
Addended by: [name] B on: 05/23/2019 12:28 PM   Modules accepted: Orders

## 2019-05-26 ENCOUNTER — Ambulatory Visit: Payer: Medicaid Other | Admitting: Internal Medicine

## 2019-05-26 ENCOUNTER — Other Ambulatory Visit: Payer: Self-pay

## 2019-05-26 ENCOUNTER — Ambulatory Visit (HOSPITAL_COMMUNITY)
Admission: RE | Admit: 2019-05-26 | Discharge: 2019-05-26 | Disposition: A | Discharge status: Home / Self Care | Payer: Medicare Other | Source: Ambulatory Visit | Attending: Internal Medicine | Admitting: Internal Medicine

## 2019-05-26 ENCOUNTER — Ambulatory Visit (HOSPITAL_COMMUNITY): Payer: Self-pay | Admitting: Pharmacist

## 2019-05-26 VITALS — BP 96/0 | HR 86 | Wt 266.8 lb

## 2019-05-26 DIAGNOSIS — I10 Essential (primary) hypertension: Secondary | ICD-10-CM

## 2019-05-26 DIAGNOSIS — N1831 Chronic kidney disease, stage 3a: Secondary | ICD-10-CM | POA: Diagnosis not present

## 2019-05-26 DIAGNOSIS — Z95811 Presence of heart assist device: Secondary | ICD-10-CM | POA: Diagnosis present

## 2019-05-26 DIAGNOSIS — I5022 Chronic systolic (congestive) heart failure: Secondary | ICD-10-CM

## 2019-05-26 DIAGNOSIS — Z7901 Long term (current) use of anticoagulants: Secondary | ICD-10-CM | POA: Insufficient documentation

## 2019-05-26 DIAGNOSIS — E079 Disorder of thyroid, unspecified: Secondary | ICD-10-CM

## 2019-05-26 LAB — COMPREHENSIVE METABOLIC PANEL
ALT: 20 U/L (ref 0–44)
AST: 34 U/L (ref 15–41)
Albumin: 3.4 g/dL — ABNORMAL LOW (ref 3.5–5.0)
Alkaline Phosphatase: 43 U/L (ref 38–126)
Anion gap: 11 (ref 5–15)
BUN: 19 mg/dL (ref 6–20)
CO2: 24 mmol/L (ref 22–32)
Calcium: 9.1 mg/dL (ref 8.9–10.3)
Chloride: 104 mmol/L (ref 98–111)
Creatinine, Ser: 1.79 mg/dL — ABNORMAL HIGH (ref 0.61–1.24)
GFR calc Af Amer: 50 mL/min — ABNORMAL LOW (ref >60)
GFR calc non Af Amer: 43 mL/min — ABNORMAL LOW (ref >60)
Glucose, Bld: 155 mg/dL — ABNORMAL HIGH (ref 70–99)
Potassium: 4.6 mmol/L (ref 3.5–5.1)
Sodium: 139 mmol/L (ref 135–145)
Total Bilirubin: 0.6 mg/dL (ref 0.3–1.2)
Total Protein: 7.4 g/dL (ref 6.5–8.1)

## 2019-05-26 LAB — LACTATE DEHYDROGENASE: LDH: 256 U/L — ABNORMAL HIGH (ref 98–192)

## 2019-05-26 LAB — CBC
HCT: 47.1 % (ref 39.0–52.0)
Hemoglobin: 15 g/dL (ref 13.0–17.0)
MCH: 29.5 pg (ref 26.0–34.0)
MCHC: 31.8 g/dL (ref 30.0–36.0)
MCV: 92.7 fL (ref 80.0–100.0)
Platelets: 238 10*3/uL (ref 150–400)
RBC: 5.08 MIL/uL (ref 4.22–5.81)
RDW: 14.9 % (ref 11.5–15.5)
WBC: 7.1 10*3/uL (ref 4.0–10.5)
nRBC: 0 % (ref 0.0–0.2)

## 2019-05-26 LAB — PROTIME-INR
INR: 1.8 — ABNORMAL HIGH (ref 0.8–1.2)
Prothrombin Time: 20.2 seconds — ABNORMAL HIGH (ref 11.4–15.2)

## 2019-05-26 LAB — PREALBUMIN: Prealbumin: 25.6 mg/dL (ref 18–38)

## 2019-05-26 NOTE — Patient Instructions (Signed)
1. No change in medications 2. Return to clinic next week for a dressing change and in 2 months for a full visit.

## 2019-05-26 NOTE — Progress Notes (Signed)
LVAD INR 

## 2019-05-26 NOTE — Progress Notes (Signed)
Patient presents for 2 month follow up in VAD Clinic today. Reports no problems with VAD equipment or concerns with drive line.   Pt says he has no limitations to daily activities. He has been going golfing frequently throughout the week. He has also been going to the mall and walking for 40 minutes 4-5 times per week. Denies shortness of breath with activity.   Vital Signs:        Doppler Pressure: 96 Automatc BP: 101/84 (84)    HR: 86      SPO2: 98% on RA       Weight: 266.8 lb w/ eqt  Last weight: 269.8 lb  VAD Indication: Destination Therapy - due to limited social support. Pt discussed with Dr. Edwena Blow at [facility] prior to LVAD implant.  LVAD assessment:      Speed: 5300 RPM      Flow: 4.3       Power: 4.1 w       PI: 5.8       Alarms: none  Events: 10-20 Hct: 20       Fixed speed: 5300 Low speed limit: 5000  Primary Controller: Replace back up battery in 32 months.  Back up controller: Replace back up battery in 13 months.  I reviewed the LVAD parameters from today and compared the results to the patient's prior recorded data. LVAD interrogation was STABLE for significant power changes, NEGATIVE for clinical alarms and STABLE for PI events/speed drops. No programming changes were made and pump is functioning within specified parameters. Pt is performing daily controller and system monitor self tests along with completing weekly and monthly maintenance for LVAD equipment.  LVAD equipment check completed and is in good working order. Back-up equipment present.   Annual maintenance completed on patient's home equipment per [employer] today, 05/26/19.  Exit Site Care: Gene is doing weekly dressing change on Thursdays. Existing VAD dressing removed and site care performed using sterile technique. Drive line exit site cleaned with Chlora prep applicators x 2, allowed to dry, and biopatch w/ Sorbaview dressing applied. Exit site healed and incorporated, the velour is fully implanted  at exit site.  No drainage, redness, tenderness, foul odor or rash noted. Drive line anchor re-applied. Pt denies fever or chills.  pt  has adequate dressing supplies at home.   Device:  N/A  BP & Labs:  Doppler 96 - is reflecting modified systolic  Hgb 15 - No S/S of bleeding. Specifically denies melena/BRBPR or nosebleeds.  LDH 256 - and is within established baseline of 200 - 400. Denies tea-colored urine. No power elevations noted on interrogation other than above noted incidents.   Thyroid panel pending  Annual maintenance completed per [employer] on patient's home power module and universal Magazine features editor.    Backup system controller 11 volt battery charged during visit.   2 year Intermacs follow up completed including:  Quality of Life, KCCQ-12, and Neurocognitive trail making.   Pt completed 1400 feet during 6 minute walk.   Patient Instructions:  1. No change in medications 2. Return to clinic next week for a dressing change and in 2 months for a full visit.   [name] RN VAD Coordinator  Office: [phone]  24/7 Pager: [phone]

## 2019-05-27 LAB — THYROID PANEL WITH TSH
Free Thyroxine Index: 1.8 (ref 1.2–4.9)
T3 Uptake Ratio: 32 % (ref 24–39)
T4, Total: 5.5 ug/dL (ref 4.5–12.0)
TSH: 2.77 u[IU]/mL (ref 0.450–4.500)

## 2019-05-27 NOTE — Progress Notes (Signed)
LVAD Clinic Note   Primary Cardiologist: Dr. Gala Romney   HPI: Benjamin Sherman is a 49 y.o. 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 ECHO showed severely reduced EF. 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 Placed on steroids without benefit.  Admitted 11/18 after fall/syncopal episode resulting in bifrontal SAH. Neurosurgery consulted.   Admitted 4/19 with cardiogenic shock in setting of AFL. Supported with inotropes and underwent DC-CV but had persistent shock. After numerous discussions about concern for adequate social support underwent placement of HM-3 LVAD on 05/15/17. D/c home 06/04/17  Here for regular f/u. Doing very well. Playing golf and walking regularly. Denies SOB or edema. Denies orthopnea or PND. No fevers, chills or problems with driveline. No bleeding, melena or neuro symptoms. No VAD alarms. Taking all meds as prescribed.     VAD Indication:  Destination Therapy - due to limited social support. Pt discussed with Dr. Edwena Blow at [facility] prior to LVAD implant.  LVAD assessment:  Speed: 5300 RPM  Flow: 4.3  Power: 4.1 w  PI: 5.8  Alarms: none  Events: 10-20  Hct: 20   Fixed speed: 5300  Low speed limit: 5000  Primary Controller: Replace back up battery in 32 months.  Back up controller: Replace back up battery in 13 months  Review of systems complete and found to be negative unless listed in HPI.   Past Medical History:  Diagnosis Date  . CHF (congestive heart failure) (HCC)   . Diabetes mellitus without complication (HCC)     Current Outpatient Medications  Medication Sig Dispense Refill  . amLODipine (NORVASC) 5 MG tablet Take 2 tablets (10 mg total) by mouth daily. 90 tablet 3  . aspirin 81 MG EC tablet Take 1 tablet (81 mg total) by mouth daily. 90 tablet 3  . Blood Glucose Monitoring  Suppl (TRUE METRIX METER) DEVI 1 each by Does not apply route daily with breakfast. 1 Device 0  . ferrous sulfate 325 (65 FE) MG EC tablet Take 1 tablet (325 mg total) by mouth 2 (two) times daily. 180 tablet 1  . glipiZIDE (GLUCOTROL) 5 MG tablet Take 1 tablet (5 mg total) by mouth 2 (two) times daily before a meal. 60 tablet 4  . glucose blood (TRUE METRIX BLOOD GLUCOSE TEST) test strip Use daily before breakfast 30 each 12  . pantoprazole (PROTONIX) 40 MG tablet Take 1 tablet (40 mg total) by mouth daily. 30 tablet 5  . sacubitril-valsartan (ENTRESTO) 49-51 MG Take 1 tablet by mouth 2 (two) times daily. 60 tablet 4  . TRUEPLUS LANCETS 28G MISC 1 each by Does not apply route daily. 30 each 12  . warfarin (COUMADIN) 6 MG tablet Take 1/2 tablet (3 mg) daily except 1 tablet (6 mg) on Monday and Friday 90 tablet 3   No current facility-administered medications for this encounter.   Allergies  Allergen Reactions  . Bee Venom     UNSPECIFIED REACTION    Social History   Socioeconomic History  . Marital status: Single    Spouse name: Not on file  . Number of children: Not on file  . Years of education: Not on file  . Highest education level: Not on file  Occupational History  . Occupation: security  Tobacco Use  . Smoking status: Former Smoker    Types: Cigars    Quit date:  11/16/2016    Years since quitting: 2.5  . Smokeless tobacco: Never Used  . Tobacco comment: 6 cigars per wk  Substance and Sexual Activity  . Alcohol use: Yes    Alcohol/week: 4.0 standard drinks    Types: 2 Shots of liquor, 2 Cans of beer per week    Comment: ocasional  . Drug use: No  . Sexual activity: Not Currently  Other Topics Concern  . Not on file  Social History Narrative  . Not on file   Social Determinants of Health   Financial Resource Strain:   . Difficulty of Paying Living Expenses:   Food Insecurity:   . Worried About Charity fundraiser in the Last Year:   . Arboriculturist in the Last  Year:   Transportation Needs:   . Film/video editor (Medical):   Marland Kitchen Lack of Transportation (Non-Medical):   Physical Activity:   . Days of Exercise per Week:   . Minutes of Exercise per Session:   Stress:   . Feeling of Stress :   Social Connections:   . Frequency of Communication with Friends and Family:   . Frequency of Social Gatherings with Friends and Family:   . Attends Religious Services:   . Active Member of Clubs or Organizations:   . Attends Archivist Meetings:   Marland Kitchen Marital Status:   Intimate Partner Violence:   . Fear of Current or Ex-Partner:   . Emotionally Abused:   Marland Kitchen Physically Abused:   . Sexually Abused:     Family History  Problem Relation Age of Onset  . CAD Father    Vitals:   05/26/19 1203 05/26/19 1204  BP: 101/72 (!) 96/0  Pulse: 86   SpO2: 98%   Weight: 121 kg (266 lb 12.8 oz)     Wt Readings from Last 3 Encounters:  05/26/19 121 kg (266 lb 12.8 oz)  03/24/19 122.4 kg (269 lb 12.8 oz)  02/26/19 120.7 kg (266 lb)     Vital Signs:  Doppler Pressure: 96  Automatc BP: 101/84 (84)  HR: 86  SPO2: 98% on RA  Weight: 266.8 lb w/ eqt  Last weight: 269.8 lb   Physical Exam: General:  NAD.  HEENT: normal  Neck: supple. JVP not elevated.  Carotids 2+ bilat; no bruits. No lymphadenopathy or thryomegaly appreciated. Cor: LVAD hum.  Lungs: Clear. Abdomen: obese soft, nontender, non-distended. No hepatosplenomegaly. No bruits or masses. Good bowel sounds. Driveline site clean. Anchor in place.  Extremities: no cyanosis, clubbing, rash. Warm no edema  Neuro: alert & oriented x 3. No focal deficits. Moves all 4 without problem    ASSESSMENT & PLAN:  1. Chronic Combined Systolic/Diastolic Heart Failure. NICM  - ECHO 4/19 EF 20% CMRI EF 22%  - s/p HM-3 LVAD on 05/15/17 - Now 2 years out. Continues to do very well on VAD support NYHA I - Volume status ok. Does not require lasix  2. VAD management.  - VAD interrogated personally.  Parameters stable. - Has frequent PI events at times but no low flows. Continue to remind him to keep up with fluid intake. Currently no clinical significance. No change - LDH 256 - Remains on ASA 81 mg daily + coumadin. -  INR 1.8 INR goal 2.0-2.5 Discussed dosing with PharmD personally. - Controller and clips changed at last visit. No further beeping  3. AFL - Maintaining NSR - Has been off amio. - Continue coumadin  4. HTN - MAPs  improved after inncreasing amlodipine to 10 at last visit. - Continue Entresto at [id number]. Will not increase now given frequent PI events and risk of increasing with increased volume removal  5. Hypothyroidism - improved off amio - TFTs ok.    6. DMII - Per PCP  7. CKD 3 - Creatinine baseline 1.6-1.8 - Stable at 1.79 today - Continue fluid intake  Total time spent 35 minutes. Over half that time spent discussing above.   [name], MD 05/27/19

## 2019-05-30 ENCOUNTER — Other Ambulatory Visit (HOSPITAL_COMMUNITY): Payer: Self-pay | Admitting: Unknown Physician Specialty

## 2019-05-30 DIAGNOSIS — Z95811 Presence of heart assist device: Secondary | ICD-10-CM

## 2019-05-30 DIAGNOSIS — Z7901 Long term (current) use of anticoagulants: Secondary | ICD-10-CM

## 2019-06-03 ENCOUNTER — Other Ambulatory Visit (HOSPITAL_COMMUNITY): Payer: Medicare Other

## 2019-06-24 ENCOUNTER — Ambulatory Visit (HOSPITAL_COMMUNITY)
Admission: RE | Admit: 2019-06-24 | Discharge: 2019-06-24 | Disposition: A | Discharge status: Home / Self Care | Payer: Medicare Other | Source: Ambulatory Visit | Attending: Cardiology | Admitting: Cardiology

## 2019-06-24 ENCOUNTER — Other Ambulatory Visit (HOSPITAL_COMMUNITY): Payer: Self-pay | Admitting: *Deleted

## 2019-06-24 ENCOUNTER — Ambulatory Visit (HOSPITAL_COMMUNITY): Payer: Self-pay | Admitting: Pharmacist

## 2019-06-24 ENCOUNTER — Other Ambulatory Visit: Payer: Self-pay

## 2019-06-24 DIAGNOSIS — Z7901 Long term (current) use of anticoagulants: Secondary | ICD-10-CM

## 2019-06-24 DIAGNOSIS — Z95811 Presence of heart assist device: Secondary | ICD-10-CM | POA: Diagnosis present

## 2019-06-24 LAB — PROTIME-INR
INR: 1.7 — ABNORMAL HIGH (ref 0.8–1.2)
Prothrombin Time: 19.2 seconds — ABNORMAL HIGH (ref 11.4–15.2)

## 2019-06-24 NOTE — Progress Notes (Signed)
LVAD INR 

## 2019-06-24 NOTE — Progress Notes (Signed)
Patient presents to clinic today for drive line exit wound care. Existing VAD dressing removed and site care performed using sterile technique. Drive line exit site cleaned with Chlora prep applicators x 2, allowed to dry, and Sorbaview with bio-patch re-applied. Exit site with partial incorporation; small amount dark old bloody drainage noted on bio patch. No redness, tenderness,  foul odor or rash noted. Pt does admit to "some" drive line trauma while playing golf. Advised him of importance of weekly dressing changes and keeping DL secure. He says he will switch either to concealed weapon shirt or wear abdominal binder in future.   Emphasized patient needs to keep weekly appointments here for DL care. Explained we may need to switch to gauze dressings if he is outdoors in the heat to reduce moisture under Sorbaview dressing. Pt agreed to keep future appointments. Drive line anchor re-applied. Pt denies fever or chills. Pt provided with 4 weekly dressing kits and 5 extra anchors. Asked pt to bring dressing kit each week for his dressing changes. Pt verbalized understanding of same.   Return in 1 week for INR and dressing change. Warfarin dosing per Audry Riles, PharmD.  Zada Girt RN [place] Coordinator  Office: [phone]  24/7 Pager: [phone]

## 2019-06-27 ENCOUNTER — Other Ambulatory Visit (HOSPITAL_COMMUNITY): Payer: Self-pay | Admitting: *Deleted

## 2019-06-27 DIAGNOSIS — Z95811 Presence of heart assist device: Secondary | ICD-10-CM

## 2019-07-01 ENCOUNTER — Other Ambulatory Visit (HOSPITAL_COMMUNITY): Payer: Medicare Other

## 2019-07-03 ENCOUNTER — Other Ambulatory Visit (HOSPITAL_COMMUNITY): Payer: Medicare Other

## 2019-07-14 ENCOUNTER — Ambulatory Visit (HOSPITAL_COMMUNITY)
Admission: RE | Admit: 2019-07-14 | Discharge: 2019-07-14 | Disposition: A | Discharge status: Home / Self Care | Payer: Medicare Other | Source: Ambulatory Visit | Attending: Internal Medicine | Admitting: Internal Medicine

## 2019-07-14 ENCOUNTER — Ambulatory Visit (HOSPITAL_COMMUNITY): Payer: Self-pay | Admitting: Pharmacist

## 2019-07-14 ENCOUNTER — Other Ambulatory Visit: Payer: Self-pay

## 2019-07-14 DIAGNOSIS — Z7901 Long term (current) use of anticoagulants: Secondary | ICD-10-CM | POA: Insufficient documentation

## 2019-07-14 DIAGNOSIS — Z95811 Presence of heart assist device: Secondary | ICD-10-CM | POA: Diagnosis present

## 2019-07-14 LAB — PROTIME-INR
INR: 1.4 — ABNORMAL HIGH (ref 0.8–1.2)
Prothrombin Time: 16.8 seconds — ABNORMAL HIGH (ref 11.4–15.2)

## 2019-07-14 LAB — IRON AND TIBC
Iron: 76 ug/dL (ref 45–182)
Saturation Ratios: 26 % (ref 17.9–39.5)
TIBC: 295 ug/dL (ref 250–450)
UIBC: 219 ug/dL

## 2019-07-14 LAB — FERRITIN: Ferritin: 302 ng/mL (ref 24–336)

## 2019-07-14 LAB — FOLATE: Folate: 16.6 ng/mL (ref >5.9)

## 2019-07-14 LAB — VITAMIN B12: Vitamin B-12: 411 pg/mL (ref 180–914)

## 2019-07-14 MED ORDER — ENOXAPARIN SODIUM 40 MG/0.4ML ~~LOC~~ SOLN
40.0000 mg | Freq: Two times a day (BID) | SUBCUTANEOUS | 0 refills | Status: DC
Start: 2019-07-14 — End: 2022-01-06

## 2019-07-14 NOTE — Progress Notes (Signed)
LVAD INR 

## 2019-07-14 NOTE — Addendum Note (Signed)
Encounter addended by: Lebron Quam, RN on: 07/14/2019 9:47 AM  Actions taken: Clinical Note Signed

## 2019-07-14 NOTE — Progress Notes (Signed)
Patient presents to clinic today for drive line exit wound care. Existing VAD dressing removed and site care performed using sterile technique. Drive line exit site cleaned with Chlora prep applicators x 2, allowed to dry, and Sorbaview with bio-patch re-applied. Exit site with partial incorporation; scant amount dark old bloody drainage noted on bio patch. No redness, tenderness,  foul odor or rash noted. Advised him of importance of weekly dressing changes and keeping DL secure. He says he will switch either to concealed weapon shirt or wear abdominal binder in future.   Emphasized patient needs to keep weekly appointments here for DL care. Explained we may need to switch to gauze dressings if he is outdoors in the heat to reduce moisture under Sorbaview dressing. Pt agreed to keep future appointments. Drive line anchor re-applied. Pt denies fever or chills. Pt provided with  5 extra anchors. Asked pt to bring dressing kit each week for his dressing changes. Pt verbalized understanding of same.   Return in 1 week for INR and dressing change. Warfarin dosing per Audry Riles, PharmD.  Tanda Rockers RN [place] Coordinator  Office: [phone]  24/7 Pager: [phone]

## 2019-07-18 ENCOUNTER — Other Ambulatory Visit (HOSPITAL_COMMUNITY): Payer: Self-pay | Admitting: *Deleted

## 2019-07-18 DIAGNOSIS — Z7901 Long term (current) use of anticoagulants: Secondary | ICD-10-CM

## 2019-07-18 DIAGNOSIS — Z95811 Presence of heart assist device: Secondary | ICD-10-CM

## 2019-07-22 ENCOUNTER — Other Ambulatory Visit (HOSPITAL_COMMUNITY): Payer: Medicare Other

## 2019-07-25 ENCOUNTER — Other Ambulatory Visit: Payer: Self-pay | Admitting: Physician Assistant

## 2019-07-25 DIAGNOSIS — E1169 Type 2 diabetes mellitus with other specified complication: Secondary | ICD-10-CM

## 2019-07-28 ENCOUNTER — Encounter (HOSPITAL_COMMUNITY): Payer: Medicare Other

## 2019-07-28 ENCOUNTER — Other Ambulatory Visit (HOSPITAL_COMMUNITY): Payer: Self-pay | Admitting: *Deleted

## 2019-07-28 DIAGNOSIS — Z95811 Presence of heart assist device: Secondary | ICD-10-CM

## 2019-07-28 DIAGNOSIS — Z7901 Long term (current) use of anticoagulants: Secondary | ICD-10-CM

## 2019-07-31 ENCOUNTER — Other Ambulatory Visit: Payer: Self-pay

## 2019-07-31 ENCOUNTER — Ambulatory Visit (HOSPITAL_COMMUNITY)
Admission: RE | Admit: 2019-07-31 | Discharge: 2019-07-31 | Disposition: A | Discharge status: Home / Self Care | Payer: Medicare Other | Source: Ambulatory Visit | Attending: Cardiology | Admitting: Cardiology

## 2019-07-31 ENCOUNTER — Ambulatory Visit (HOSPITAL_COMMUNITY): Payer: Self-pay | Admitting: Pharmacist

## 2019-07-31 DIAGNOSIS — Z95811 Presence of heart assist device: Secondary | ICD-10-CM | POA: Diagnosis present

## 2019-07-31 DIAGNOSIS — Z7901 Long term (current) use of anticoagulants: Secondary | ICD-10-CM | POA: Diagnosis not present

## 2019-07-31 LAB — PROTIME-INR
INR: 3.5 — ABNORMAL HIGH (ref 0.8–1.2)
Prothrombin Time: 33.7 seconds — ABNORMAL HIGH (ref 11.4–15.2)

## 2019-07-31 NOTE — Progress Notes (Signed)
Patient presents to clinic today for drive line exit wound care. Existing VAD dressing removed and site care performed using sterile technique. Drive line exit site cleaned with Chlora prep applicators x 2, allowed to dry, and Sorbaview with bio-patch re-applied. Exit site with partial incorporation; No redness, tenderness, drainage, foul odor or rash noted. Advised him of importance of weekly dressing changes and keeping DL secure.   Emphasized patient needs to keep weekly appointments here for DL care. Explained we may need to switch to gauze dressings if he is outdoors in the heat to reduce moisture under Sorbaview dressing. Pt agreed to keep future appointments. Drive line anchor re-applied. Pt denies fever or chills. Pt provided with 3 extra weekly kits and ask to bring dressing kit each week for his dressing changes. Pt verbalized understanding of same.   Return next week for full VAD clinic appointment. Warfarin dosing per Audry Riles, PharmD.  Zada Girt RN [place] Coordinator  Office: [phone]  24/7 Pager: [phone]

## 2019-07-31 NOTE — Progress Notes (Signed)
LVAD INR 

## 2019-07-31 NOTE — Addendum Note (Signed)
Encounter addended by: Levonne Spiller, RN on: 07/31/2019 11:03 AM  Actions taken: Clinical Note Signed

## 2019-08-04 ENCOUNTER — Other Ambulatory Visit (HOSPITAL_COMMUNITY): Payer: Self-pay | Admitting: Unknown Physician Specialty

## 2019-08-04 ENCOUNTER — Other Ambulatory Visit: Payer: Self-pay | Admitting: Internal Medicine

## 2019-08-04 DIAGNOSIS — Z7901 Long term (current) use of anticoagulants: Secondary | ICD-10-CM

## 2019-08-04 DIAGNOSIS — Z95811 Presence of heart assist device: Secondary | ICD-10-CM

## 2019-08-04 NOTE — Addendum Note (Signed)
Addended by: [name] B on: 08/04/2019 01:17 PM   Modules accepted: Orders

## 2019-08-05 ENCOUNTER — Encounter (HOSPITAL_COMMUNITY): Payer: Medicare Other

## 2019-08-13 ENCOUNTER — Encounter (HOSPITAL_COMMUNITY): Payer: Medicare Other

## 2019-08-20 ENCOUNTER — Other Ambulatory Visit: Payer: Self-pay

## 2019-08-20 ENCOUNTER — Ambulatory Visit (HOSPITAL_COMMUNITY): Payer: Self-pay | Admitting: Pharmacist

## 2019-08-20 ENCOUNTER — Ambulatory Visit (HOSPITAL_COMMUNITY)
Admission: RE | Admit: 2019-08-20 | Discharge: 2019-08-20 | Disposition: A | Discharge status: Home / Self Care | Payer: Medicare Other | Source: Ambulatory Visit | Attending: Cardiology | Admitting: Cardiology

## 2019-08-20 DIAGNOSIS — Z95811 Presence of heart assist device: Secondary | ICD-10-CM | POA: Insufficient documentation

## 2019-08-20 DIAGNOSIS — Z7901 Long term (current) use of anticoagulants: Secondary | ICD-10-CM | POA: Insufficient documentation

## 2019-08-20 LAB — BASIC METABOLIC PANEL
Anion gap: 10 (ref 5–15)
BUN: 21 mg/dL — ABNORMAL HIGH (ref 6–20)
CO2: 24 mmol/L (ref 22–32)
Calcium: 9.1 mg/dL (ref 8.9–10.3)
Chloride: 101 mmol/L (ref 98–111)
Creatinine, Ser: 1.51 mg/dL — ABNORMAL HIGH (ref 0.61–1.24)
GFR calc Af Amer: >60 mL/min (ref >60)
GFR calc non Af Amer: 53 mL/min — ABNORMAL LOW (ref >60)
Glucose, Bld: 196 mg/dL — ABNORMAL HIGH (ref 70–99)
Potassium: 4.9 mmol/L (ref 3.5–5.1)
Sodium: 135 mmol/L (ref 135–145)

## 2019-08-20 LAB — CBC
HCT: 43.6 % (ref 39.0–52.0)
Hemoglobin: 14.3 g/dL (ref 13.0–17.0)
MCH: 29.9 pg (ref 26.0–34.0)
MCHC: 32.8 g/dL (ref 30.0–36.0)
MCV: 91 fL (ref 80.0–100.0)
Platelets: 261 10*3/uL (ref 150–400)
RBC: 4.79 MIL/uL (ref 4.22–5.81)
RDW: 14.9 % (ref 11.5–15.5)
WBC: 7.4 10*3/uL (ref 4.0–10.5)
nRBC: 0 % (ref 0.0–0.2)

## 2019-08-20 LAB — PROTIME-INR
INR: 1.5 — ABNORMAL HIGH (ref 0.8–1.2)
Prothrombin Time: 17.5 seconds — ABNORMAL HIGH (ref 11.4–15.2)

## 2019-08-20 LAB — LACTATE DEHYDROGENASE: LDH: 478 U/L — ABNORMAL HIGH (ref 98–192)

## 2019-08-20 NOTE — Progress Notes (Signed)
Patient presents to clinic today for drive line exit wound care. Existing VAD dressing removed and site care performed using sterile technique. Drive line exit site cleaned with Chlora prep applicators x 2, allowed to dry, and Sorbaview with bio-patch re-applied. Exit site with partial incorporation; No redness, tenderness, drainage, foul odor or rash noted. Advised him of importance of weekly dressing changes and keeping DL secure.  Pt provided with 3 extra weekly kits and ask to bring dressing kit each week for his dressing changes. Pt verbalized understanding of same.   Return 09/01/19 for full VAD clinic appointment. Warfarin dosing per Audry Riles, PharmD.  Zada Girt RN [place] Coordinator  Office: [phone]  24/7 Pager: [phone]

## 2019-08-20 NOTE — Progress Notes (Signed)
LVAD INR 

## 2019-08-28 ENCOUNTER — Other Ambulatory Visit (HOSPITAL_COMMUNITY): Payer: Self-pay | Admitting: *Deleted

## 2019-08-28 DIAGNOSIS — Z7901 Long term (current) use of anticoagulants: Secondary | ICD-10-CM

## 2019-08-28 DIAGNOSIS — I5022 Chronic systolic (congestive) heart failure: Secondary | ICD-10-CM

## 2019-08-28 DIAGNOSIS — Z95811 Presence of heart assist device: Secondary | ICD-10-CM

## 2019-09-01 ENCOUNTER — Telehealth (HOSPITAL_COMMUNITY): Payer: Self-pay | Admitting: *Deleted

## 2019-09-01 ENCOUNTER — Ambulatory Visit (HOSPITAL_COMMUNITY)
Admission: RE | Admit: 2019-09-01 | Discharge: 2019-09-01 | Disposition: A | Discharge status: Home / Self Care | Payer: Medicare Other | Source: Ambulatory Visit | Attending: Internal Medicine | Admitting: Internal Medicine

## 2019-09-01 ENCOUNTER — Other Ambulatory Visit: Payer: Self-pay

## 2019-09-01 ENCOUNTER — Encounter (HOSPITAL_COMMUNITY): Payer: Self-pay

## 2019-09-01 ENCOUNTER — Ambulatory Visit (HOSPITAL_COMMUNITY): Payer: Self-pay | Admitting: Pharmacist

## 2019-09-01 VITALS — BP 107/71 | HR 81 | Wt 262.4 lb

## 2019-09-01 DIAGNOSIS — I130 Hypertensive heart and chronic kidney disease with heart failure and stage 1 through stage 4 chronic kidney disease, or unspecified chronic kidney disease: Secondary | ICD-10-CM | POA: Diagnosis not present

## 2019-09-01 DIAGNOSIS — Z95811 Presence of heart assist device: Secondary | ICD-10-CM | POA: Insufficient documentation

## 2019-09-01 DIAGNOSIS — N1831 Chronic kidney disease, stage 3a: Secondary | ICD-10-CM | POA: Diagnosis not present

## 2019-09-01 DIAGNOSIS — N183 Chronic kidney disease, stage 3 unspecified: Secondary | ICD-10-CM | POA: Diagnosis not present

## 2019-09-01 DIAGNOSIS — Z79899 Other long term (current) drug therapy: Secondary | ICD-10-CM | POA: Insufficient documentation

## 2019-09-01 DIAGNOSIS — E1122 Type 2 diabetes mellitus with diabetic chronic kidney disease: Secondary | ICD-10-CM | POA: Diagnosis not present

## 2019-09-01 DIAGNOSIS — I428 Other cardiomyopathies: Secondary | ICD-10-CM | POA: Diagnosis not present

## 2019-09-01 DIAGNOSIS — I5042 Chronic combined systolic (congestive) and diastolic (congestive) heart failure: Secondary | ICD-10-CM | POA: Insufficient documentation

## 2019-09-01 DIAGNOSIS — I5022 Chronic systolic (congestive) heart failure: Secondary | ICD-10-CM

## 2019-09-01 DIAGNOSIS — Z7901 Long term (current) use of anticoagulants: Secondary | ICD-10-CM | POA: Diagnosis not present

## 2019-09-01 DIAGNOSIS — Z87891 Personal history of nicotine dependence: Secondary | ICD-10-CM | POA: Insufficient documentation

## 2019-09-01 DIAGNOSIS — Z7982 Long term (current) use of aspirin: Secondary | ICD-10-CM | POA: Insufficient documentation

## 2019-09-01 DIAGNOSIS — E039 Hypothyroidism, unspecified: Secondary | ICD-10-CM | POA: Insufficient documentation

## 2019-09-01 DIAGNOSIS — Z8249 Family history of ischemic heart disease and other diseases of the circulatory system: Secondary | ICD-10-CM | POA: Insufficient documentation

## 2019-09-01 DIAGNOSIS — Z7984 Long term (current) use of oral hypoglycemic drugs: Secondary | ICD-10-CM | POA: Insufficient documentation

## 2019-09-01 LAB — BASIC METABOLIC PANEL
Anion gap: 10 (ref 5–15)
BUN: 23 mg/dL — ABNORMAL HIGH (ref 6–20)
CO2: 22 mmol/L (ref 22–32)
Calcium: 9.3 mg/dL (ref 8.9–10.3)
Chloride: 105 mmol/L (ref 98–111)
Creatinine, Ser: 1.67 mg/dL — ABNORMAL HIGH (ref 0.61–1.24)
GFR calc Af Amer: 54 mL/min — ABNORMAL LOW (ref >60)
GFR calc non Af Amer: 47 mL/min — ABNORMAL LOW (ref >60)
Glucose, Bld: 156 mg/dL — ABNORMAL HIGH (ref 70–99)
Potassium: 4.8 mmol/L (ref 3.5–5.1)
Sodium: 137 mmol/L (ref 135–145)

## 2019-09-01 LAB — CBC
HCT: 46.2 % (ref 39.0–52.0)
Hemoglobin: 14.8 g/dL (ref 13.0–17.0)
MCH: 28.5 pg (ref 26.0–34.0)
MCHC: 32 g/dL (ref 30.0–36.0)
MCV: 89 fL (ref 80.0–100.0)
Platelets: 243 10*3/uL (ref 150–400)
RBC: 5.19 MIL/uL (ref 4.22–5.81)
RDW: 14.9 % (ref 11.5–15.5)
WBC: 7.9 10*3/uL (ref 4.0–10.5)
nRBC: 0 % (ref 0.0–0.2)

## 2019-09-01 LAB — PROTIME-INR
INR: 1.7 — ABNORMAL HIGH (ref 0.8–1.2)
Prothrombin Time: 19.1 seconds — ABNORMAL HIGH (ref 11.4–15.2)

## 2019-09-01 LAB — LACTATE DEHYDROGENASE: LDH: 367 U/L — ABNORMAL HIGH (ref 98–192)

## 2019-09-01 MED ORDER — WARFARIN SODIUM 6 MG PO TABS: ORAL_TABLET | ORAL | 5 refills | Status: DC

## 2019-09-01 NOTE — Progress Notes (Signed)
Patient presents for 2 month follow up in VAD Clinic today alone. Reports no problems with VAD equipment or concerns with drive line.   Pt says he has no limitations to daily activities. He has been going golfing frequently throughout the week. He has also been going to the mall and walking for 45 minutes 2-5 times per week. Denies shortness of breath with activity.   Pt reports frequent "connect power" alarms that started this morning upon going on batteries. VAD interrogation shows multiple "power cable disconnect" alarms. 1 LOW VOLTAGE advisory noted this morning. Drive line and pt cable wires are severely tangled. Educated on importance of keeping wires untangled to prevent fracture of internal wires. He verbalized understanding. Demonstrated proper untangling technique. Once lines untangled, placed back on batteries, and had him standup and bend over, move around, etc. Unable to reproduce "connect power" alarm. Instructed patient to notify VAD coordinators if alarm re-occurs. He verbalized understanding.     Vital Signs:        Doppler Pressure: 92 Automatc BP: 107/71 (84)    HR: 81      SPO2: 99% on RA       Weight: 262.4 lb w/ eqt  Last weight: 266.8 lb  VAD Indication: Destination Therapy - due to limited social support. Pt discussed with Dr. Edwena Blow at [facility] prior to LVAD implant.  LVAD assessment:      Speed: 5300 RPM      Flow: 4.3       Power: 4.1 w       PI: 4.9       Alarms: none  Events: 10-20 Hct: 20       Fixed speed: 5300 Low speed limit: 5000  Primary Controller: Replace back up battery in 29 months.  Back up controller: Replace back up battery in 13 months.  I reviewed the LVAD parameters from today and compared the results to the patient's prior recorded data. LVAD interrogation was STABLE for significant power changes, NEGATIVE for clinical alarms and STABLE for PI events/speed drops. No programming changes were made and pump is functioning within  specified parameters. Pt is performing daily controller and system monitor self tests along with completing weekly and monthly maintenance for LVAD equipment.  LVAD equipment check completed and is in good working order. Back-up equipment present.   Annual maintenance completed on patient's home equipment per [employer] today, 05/26/19.  Exit Site Care: Gene is doing weekly dressing change on Thursdays. Existing VAD dressing removed and site care performed using sterile technique. Drive line exit site cleaned with Chlora prep applicators x 2, allowed to dry, and biopatch w/ Sorbaview dressing applied. Exit site healed and incorporated, the velour is fully implanted at exit site.  No drainage, redness, tenderness, foul odor or rash noted. Drive line anchor re-applied. Pt denies fever or chills. Pt provided with 10 anchors today; otherwise has adequate dressing supplies at home.   Device:  N/A  BP & Labs:  Doppler 92 - is reflecting modified systolic  Hgb 14.8- No S/S of bleeding. Specifically denies melena/BRBPR or nosebleeds.  LDH 367 - and is within established baseline of 200 - 400. Denies tea-colored urine. No power elevations noted on interrogation other than above noted incidents.    Patient Instructions:  1. No medication changes 2. Coumadin dosing per Leotis Shames PharmD 3. Return to VAD clinic in 1 week for INR and dressing change 4. Return to VAD clinic in 2 months for full follow up visit  Alyce Pagan  RN VAD Coordinator  Office: [phone]  24/7 Pager: [phone]

## 2019-09-01 NOTE — Telephone Encounter (Signed)
Pt called VAD clinic after completing clinic visit earlier this am. Reports his controller is intermittently beeping "replace power". This has occurred in the past and he has had battery clip change out per [name], VAD Coordinator. Asked patient to view side of controller; he reports he has the v1.6 model which has history of this advisory alarm. Asked pt to return to clinic for controller change out, he reports he is almost to [place] and is unable to do so today. Advised him brief advisory alarms are of no concern for pump stoppage, but if advisories/ alarms should occur more frequently he should call VAD pager immediately or report to ED. Pt says he plans on waiting until appointment in VAD clinic next week for elective controller change out.  [name] RN, VAD Coordinator [phone]

## 2019-09-01 NOTE — Addendum Note (Signed)
Encounter addended by: Bernita Raisin, RN on: 09/01/2019 2:34 PM  Actions taken: Clinical Note Signed

## 2019-09-01 NOTE — Progress Notes (Addendum)
LVAD Clinic Note   Primary Cardiologist: Dr. Gala Romney   HPI: Benjamin Sherman is a 49 y.o. 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 ECHO showed severely reduced EF. 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 Placed on steroids without benefit.  Admitted 11/18 after fall/syncopal episode resulting in bifrontal SAH. Neurosurgery consulted.   Admitted 4/19 with cardiogenic shock in setting of AFL. Supported with inotropes and underwent DC-CV but had persistent shock. After numerous discussions about concern for adequate social support underwent placement of HM-3 LVAD on 05/15/17. D/c home 06/04/17  Here for regular f/u. Continues to do very well. Playing golf and walking regularly without problem. Denies orthopnea or PND. No fevers, chills. DL site looks of but DL cords severely tangled. No bleeding, melena or neuro symptoms. No VAD alarms. Taking all meds as prescribed.     VAD Indication:  Destination Therapy - due to limited social support. Pt discussed with Dr. Edwena Blow at [facility] prior to LVAD implant.  LVAD assessment:  Speed: 5300 RPM  Flow: 4.3  Power: 4.1 w  PI: 5.8  Alarms: none  Events: 10-20  Hct: 20   Fixed speed: 5300  Low speed limit: 5000  Primary Controller: Replace back up battery in 32 months.  Back up controller: Replace back up battery in 13 months  Review of systems complete and found to be negative unless listed in HPI.   Past Medical History:  Diagnosis Date  . CHF (congestive heart failure) (HCC)   . Diabetes mellitus without complication (HCC)     Current Outpatient Medications  Medication Sig Dispense Refill  . amLODipine (NORVASC) 5 MG tablet Take 2 tablets (10 mg total) by mouth daily. 90 tablet 3  . aspirin 81 MG EC tablet Take 1 tablet (81 mg total) by mouth daily. 90 tablet 3  .  Blood Glucose Monitoring Suppl (TRUE METRIX METER) DEVI 1 each by Does not apply route daily with breakfast. 1 Device 0  . ferrous sulfate 325 (65 FE) MG EC tablet Take 1 tablet (325 mg total) by mouth 2 (two) times daily. 180 tablet 1  . glipiZIDE (GLUCOTROL) 5 MG tablet TAKE 1 TABLET BY MOUTH TWO TIMES DAILY. 60 tablet 1  . glucose blood (TRUE METRIX BLOOD GLUCOSE TEST) test strip Use daily before breakfast 30 each 12  . pantoprazole (PROTONIX) 40 MG tablet Take 1 tablet (40 mg total) by mouth daily. 30 tablet 5  . sacubitril-valsartan (ENTRESTO) 49-51 MG Take 1 tablet by mouth 2 (two) times daily. 60 tablet 4  . TRUEPLUS LANCETS 28G MISC 1 each by Does not apply route daily. 30 each 12  . warfarin (COUMADIN) 6 MG tablet TAKE 1 tablet (6mg ) Monday and Friday. Take 1/2 tablet (3mg ) all other days; or as directed by HF clinic 86 tablet 5  . enoxaparin (LOVENOX) 40 MG/0.4ML injection Inject 0.4 mLs (40 mg total) into the skin every 12 (twelve) hours. (Patient not taking: Reported on 09/01/2019) 4 mL 0   No current facility-administered medications for this encounter.   Allergies  Allergen Reactions  . Bee Venom     UNSPECIFIED REACTION    Social History   Socioeconomic History  . Marital status: Single    Spouse name: Not on file  . Number of children: Not on file  . Years of education: Not on file  .  Highest education level: Not on file  Occupational History  . Occupation: security  Tobacco Use  . Smoking status: Former Smoker    Types: Cigars    Quit date: 11/16/2016    Years since quitting: 2.7  . Smokeless tobacco: Never Used  . Tobacco comment: 6 cigars per wk  Vaping Use  . Vaping Use: Never used  Substance and Sexual Activity  . Alcohol use: Yes    Alcohol/week: 4.0 standard drinks    Types: 2 Shots of liquor, 2 Cans of beer per week    Comment: ocasional  . Drug use: No  . Sexual activity: Not Currently  Other Topics Concern  . Not on file  Social History Narrative    . Not on file   Social Determinants of Health   Financial Resource Strain:   . Difficulty of Paying Living Expenses:   Food Insecurity:   . Worried About Programme researcher, broadcasting/film/video in the Last Year:   . Barista in the Last Year:   Transportation Needs:   . Freight forwarder (Medical):   Marland Kitchen Lack of Transportation (Non-Medical):   Physical Activity:   . Days of Exercise per Week:   . Minutes of Exercise per Session:   Stress:   . Feeling of Stress :   Social Connections:   . Frequency of Communication with Friends and Family:   . Frequency of Social Gatherings with Friends and Family:   . Attends Religious Services:   . Active Member of Clubs or Organizations:   . Attends Banker Meetings:   Marland Kitchen Marital Status:   Intimate Partner Violence:   . Fear of Current or Ex-Partner:   . Emotionally Abused:   Marland Kitchen Physically Abused:   . Sexually Abused:     Family History  Problem Relation Age of Onset  . CAD Father    Vitals:   09/01/19 1012 09/01/19 1013  BP: (!) 92/0 107/71  Pulse: 81   SpO2: 99%   Weight: 119 kg (262 lb 6.4 oz)     Wt Readings from Last 3 Encounters:  09/01/19 119 kg (262 lb 6.4 oz)  05/26/19 121 kg (266 lb 12.8 oz)  03/24/19 122.4 kg (269 lb 12.8 oz)     Vital Signs:  Doppler Pressure: 96  Automatc BP: 101/84 (84)  HR: 86  SPO2: 98% on RA  Weight: 266.8 lb w/ eqt  Last weight: 269.8 lb   Physical Exam: General:  NAD.  HEENT: normal  Neck: supple. JVP not elevated.  Carotids 2+ bilat; no bruits. No lymphadenopathy or thryomegaly appreciated. Cor: LVAD hum.  Lungs: Clear. Abdomen: obese soft, nontender, non-distended. No hepatosplenomegaly. No bruits or masses. Good bowel sounds. Driveline site clean. Anchor in place. DL cors severely tangled.  Extremities: no cyanosis, clubbing, rash. Warm no edema  Neuro: alert & oriented x 3. No focal deficits. Moves all 4 without problem    ASSESSMENT & PLAN:  1. Chronic Combined  Systolic/Diastolic Heart Failure. NICM  - ECHO 4/19 EF 20% CMRI EF 22%  - s/p HM-3 LVAD on 05/15/17 - Now 2.5 years out. Continues to do very well on VAD support. NYHA I - Volume status ok. Does not require lasix  2. VAD management.  - VAD interrogated personally. Parameters stable.. - Has frequent PI events at times but no low flows. Reminded him to keep up with fluid intake - LDH 256 - Remains on ASA 81 mg daily + coumadin. -  INR 1.7 INR goal 2.0-2.5 Discussed dosing with PharmD personally. - DL cords tangled. We unraveled and discussed need for for better DL care,  3. AFL - Maintaining NSR - Has been off amio. - Continue coumadin - No change  4. HTN - MAPs improved after inncreasing amlodipine to 10  - Continue Entresto at 49/51.  - Blood pressure well controlled. Continue current regimen.  5. Hypothyroidism - improved off amio - TFTs ok.    6. DMII - Per PCP  7. CKD 3 - Creatinine baseline 1.6-1.8 - Stable at 1.67 today - Continue fluid intake  Total time spent 35 minutes. Over half that time spent discussing above.   [name], MD 09/01/19

## 2019-09-01 NOTE — Addendum Note (Signed)
Encounter addended by: Bernita Raisin, RN on: 09/01/2019 10:41 AM  Actions taken: Vitals modified, Order Reconciliation Section accessed, Home Medications modified, Medication List reviewed, Medication taking status modified, Visit diagnoses modified, Pharmacy for encounter modified, Order list changed, Diagnosis association updated, Clinical Note Signed

## 2019-09-01 NOTE — Patient Instructions (Signed)
1. No medication changes 2. Coumadin dosing per Leotis Shames PharmD 3. Return to VAD clinic in 1 week for INR and dressing change 4. Return to VAD clinic in 2 months for full follow up visit

## 2019-09-01 NOTE — Addendum Note (Signed)
Encounter addended by: Dolores Patty, MD on: 09/01/2019 3:01 PM  Actions taken: Clinical Note Signed

## 2019-09-01 NOTE — Addendum Note (Signed)
Encounter addended by: Dolores Patty, MD on: 09/01/2019 11:29 PM  Actions taken: Clinical Note Signed, Level of Service modified, Visit diagnoses modified, Charge Capture section accepted

## 2019-09-01 NOTE — Progress Notes (Signed)
LVAD INR 

## 2019-09-03 ENCOUNTER — Other Ambulatory Visit (HOSPITAL_COMMUNITY): Payer: Self-pay | Admitting: *Deleted

## 2019-09-03 DIAGNOSIS — Z95811 Presence of heart assist device: Secondary | ICD-10-CM

## 2019-09-03 DIAGNOSIS — Z7901 Long term (current) use of anticoagulants: Secondary | ICD-10-CM

## 2019-09-09 ENCOUNTER — Other Ambulatory Visit (HOSPITAL_COMMUNITY): Payer: Medicare Other

## 2019-09-19 ENCOUNTER — Other Ambulatory Visit (HOSPITAL_COMMUNITY): Payer: Medicare Other

## 2019-09-23 ENCOUNTER — Other Ambulatory Visit: Payer: Self-pay

## 2019-09-23 ENCOUNTER — Other Ambulatory Visit (HOSPITAL_COMMUNITY): Payer: Self-pay | Admitting: Unknown Physician Specialty

## 2019-09-23 ENCOUNTER — Ambulatory Visit (HOSPITAL_COMMUNITY)
Admission: RE | Admit: 2019-09-23 | Discharge: 2019-09-23 | Disposition: A | Discharge status: Home / Self Care | Payer: Medicare Other | Source: Ambulatory Visit | Attending: Internal Medicine | Admitting: Internal Medicine

## 2019-09-23 ENCOUNTER — Ambulatory Visit (HOSPITAL_COMMUNITY): Payer: Self-pay | Admitting: Pharmacist

## 2019-09-23 DIAGNOSIS — Z95811 Presence of heart assist device: Secondary | ICD-10-CM

## 2019-09-23 DIAGNOSIS — Z7901 Long term (current) use of anticoagulants: Secondary | ICD-10-CM | POA: Insufficient documentation

## 2019-09-23 LAB — PROTIME-INR
INR: 1.7 — ABNORMAL HIGH (ref 0.8–1.2)
Prothrombin Time: 19.1 seconds — ABNORMAL HIGH (ref 11.4–15.2)

## 2019-09-23 NOTE — Progress Notes (Signed)
LVAD INR 

## 2019-09-23 NOTE — Progress Notes (Signed)
Patient presents to clinic today for drive line exit wound care. Existing VAD dressing removed and site care performed using sterile technique. Drive line exit site cleaned with Chlora prep applicators x 2, allowed to dry, and Sorbaview with bio-patch re-applied. Exit site with partial incorporation; No redness, tenderness, drainage, foul odor or rash noted. Advised him of importance of weekly dressing changes and keeping DL secure.  Pt provided with 4 extra weekly kits and ask to bring dressing kit each week for his dressing changes. Pt verbalized understanding of same.   Return in 1 week for INR and dressing change per protocol.   Emerson Monte RN [place] Coordinator  Office: [phone]  24/7 Pager: [phone]

## 2019-09-24 ENCOUNTER — Other Ambulatory Visit (HOSPITAL_COMMUNITY): Payer: Self-pay | Admitting: *Deleted

## 2019-09-24 DIAGNOSIS — Z7901 Long term (current) use of anticoagulants: Secondary | ICD-10-CM

## 2019-09-24 DIAGNOSIS — Z95811 Presence of heart assist device: Secondary | ICD-10-CM

## 2019-09-25 ENCOUNTER — Other Ambulatory Visit: Payer: Self-pay | Admitting: Physician Assistant

## 2019-09-25 DIAGNOSIS — Z95811 Presence of heart assist device: Secondary | ICD-10-CM

## 2019-09-25 DIAGNOSIS — E1169 Type 2 diabetes mellitus with other specified complication: Secondary | ICD-10-CM

## 2019-09-25 NOTE — Telephone Encounter (Signed)
Requested medication (s) are due for refill today: no  Requested medication (s) are on the active medication list: yes  Future visit scheduled: no  Notes to clinic: overdue for follow up appointment    Requested Prescriptions  Pending Prescriptions Disp Refills   glipiZIDE (GLUCOTROL) 5 MG tablet [Pharmacy Med Name: GLIPIZIDE 5MG  TABLETS] 60 tablet 0    Sig: TAKE 1 TABLET BY MOUTH TWO TIMES DAILY.      Endocrinology:  Diabetes - Sulfonylureas Failed - 09/25/2019  2:26 PM      Failed - HBA1C is between 0 and 7.9 and within 180 days    Hemoglobin A1C  Date Value Ref Range Status  02/26/2019 6.8 (A) 4.0 - 5.6 % Final   HbA1c, POC (controlled diabetic range)  Date Value Ref Range Status  02/19/2018 7.4 (A) 0.0 - 7.0 % Final          Failed - Valid encounter within last 6 months    Recent Outpatient Visits           7 months ago Type 2 diabetes mellitus with other specified complication, without long-term current use of insulin [facility])   [place] [facility] [place], [place], [place]   11 months ago Type 2 diabetes mellitus with other specified complication, without long-term current use of insulin [facility])   [place] [facility] And Wellness [place], [place], [place]   1 year ago Medication management   [facility] [place], KOOMBERKINE L, RPH-CPP   1 year ago Type 2 diabetes mellitus with other specified complication, without long-term current use of insulin (HCC)   [facility] [place], [place], [place]                pantoprazole (PROTONIX) 40 MG tablet [Pharmacy Med Name: PANTOPRAZOLE DR 40 MG TABLET] 30 tablet 0    Sig: TAKE 1 TABLET BY MOUTH DAILY.      Gastroenterology: Proton Pump Inhibitors Passed - 09/25/2019  2:26 PM      Passed - Valid encounter within last 12 months    Recent Outpatient Visits           7 months ago Type 2 diabetes mellitus with other specified complication, without  long-term current use of insulin [facility])   [facility] [place] [facility] And Wellness [place], [place], [place]   11 months ago Type 2 diabetes mellitus with other specified complication, without long-term current use of insulin [facility])    [facility] And Wellness [place], [place], [place]   1 year ago Medication management   [facility], RPH-CPP   1 year ago Type 2 diabetes mellitus with other specified complication, without long-term current use of insulin [facility])    [facility], [place]

## 2019-09-30 ENCOUNTER — Other Ambulatory Visit: Payer: Self-pay

## 2019-09-30 ENCOUNTER — Ambulatory Visit (HOSPITAL_COMMUNITY): Payer: Self-pay | Admitting: Pharmacist

## 2019-09-30 ENCOUNTER — Ambulatory Visit (HOSPITAL_COMMUNITY)
Admission: RE | Admit: 2019-09-30 | Discharge: 2019-09-30 | Disposition: A | Discharge status: Home / Self Care | Payer: Medicare Other | Source: Ambulatory Visit | Attending: Cardiology | Admitting: Cardiology

## 2019-09-30 DIAGNOSIS — Z7901 Long term (current) use of anticoagulants: Secondary | ICD-10-CM | POA: Diagnosis present

## 2019-09-30 DIAGNOSIS — Z95811 Presence of heart assist device: Secondary | ICD-10-CM | POA: Diagnosis present

## 2019-09-30 LAB — PROTIME-INR
INR: 2.1 — ABNORMAL HIGH (ref 0.8–1.2)
Prothrombin Time: 22.7 seconds — ABNORMAL HIGH (ref 11.4–15.2)

## 2019-09-30 NOTE — Progress Notes (Signed)
Patient presents to clinic today for drive line exit wound care. Existing VAD dressing removed and site care performed using sterile technique. Drive line exit site cleaned with Chlora prep applicators x 2, allowed to dry, and Sorbaview with bio-patch re-applied. Exit site with partial incorporation; No redness, tenderness, drainage, foul odor or rash noted. Advised him of importance of weekly dressing changes and keeping DL secure.  Pt provided with 4 extra weekly kits and ask to bring dressing kit each week for his dressing changes. Pt verbalized understanding of same.   Return in 1 week for INR and dressing change per protocol.   Tanda Rockers RN [place] Coordinator  Office: [phone]  24/7 Pager: [phone]

## 2019-10-02 ENCOUNTER — Other Ambulatory Visit (HOSPITAL_COMMUNITY): Payer: Self-pay | Admitting: *Deleted

## 2019-10-02 DIAGNOSIS — Z95811 Presence of heart assist device: Secondary | ICD-10-CM

## 2019-10-02 DIAGNOSIS — Z7901 Long term (current) use of anticoagulants: Secondary | ICD-10-CM

## 2019-10-07 ENCOUNTER — Other Ambulatory Visit: Payer: Self-pay

## 2019-10-07 ENCOUNTER — Ambulatory Visit (HOSPITAL_COMMUNITY): Payer: Self-pay | Admitting: Pharmacist

## 2019-10-07 ENCOUNTER — Ambulatory Visit (HOSPITAL_COMMUNITY)
Admission: RE | Admit: 2019-10-07 | Discharge: 2019-10-07 | Disposition: A | Discharge status: Home / Self Care | Payer: Medicare Other | Source: Ambulatory Visit | Attending: Cardiology | Admitting: Cardiology

## 2019-10-07 DIAGNOSIS — Z7901 Long term (current) use of anticoagulants: Secondary | ICD-10-CM | POA: Diagnosis not present

## 2019-10-07 DIAGNOSIS — Z95811 Presence of heart assist device: Secondary | ICD-10-CM | POA: Diagnosis not present

## 2019-10-07 LAB — PROTIME-INR
INR: 1.7 — ABNORMAL HIGH (ref 0.8–1.2)
Prothrombin Time: 18.9 seconds — ABNORMAL HIGH (ref 11.4–15.2)

## 2019-10-07 NOTE — Progress Notes (Signed)
Patient presents to clinic today for drive line exit wound care. Existing VAD dressing removed and site care performed using sterile technique. Drive line exit site cleaned with Chlora prep applicators x 2, allowed to dry, and Sorbaview with bio-patch re-applied. Exit site with partial incorporation; No redness, tenderness, drainage, foul odor or rash noted. Advised him of importance of weekly dressing changes and keeping DL secure.  Pt provided with 5 extra anchors for home use, otherwise has adequate dressing supplies at home.   Return in 1 week for INR and dressing change per protocol.   Alyce Pagan RN VAD Coordinator  Office: [phone]  24/7 Pager: [phone]

## 2019-10-07 NOTE — Progress Notes (Signed)
LVAD INR 

## 2019-10-08 ENCOUNTER — Other Ambulatory Visit (HOSPITAL_COMMUNITY): Payer: Self-pay | Admitting: *Deleted

## 2019-10-08 DIAGNOSIS — Z7901 Long term (current) use of anticoagulants: Secondary | ICD-10-CM

## 2019-10-08 DIAGNOSIS — Z95811 Presence of heart assist device: Secondary | ICD-10-CM

## 2019-10-14 ENCOUNTER — Other Ambulatory Visit (HOSPITAL_COMMUNITY): Payer: Medicare Other

## 2019-10-24 ENCOUNTER — Other Ambulatory Visit: Payer: Self-pay | Admitting: Family Medicine

## 2019-10-24 DIAGNOSIS — Z95811 Presence of heart assist device: Secondary | ICD-10-CM

## 2019-10-24 NOTE — Telephone Encounter (Signed)
Refill request for pantoprazole last refilled 09/26/19; pharmacy note states visit needed for refills; no upcoming visits noted; attempted to contact pt; message states voicemail has not been set up; unable to leave message.

## 2019-11-04 ENCOUNTER — Encounter (HOSPITAL_COMMUNITY): Payer: Medicare Other

## 2019-11-11 ENCOUNTER — Other Ambulatory Visit (HOSPITAL_COMMUNITY): Payer: Self-pay | Admitting: *Deleted

## 2019-11-11 DIAGNOSIS — Z7901 Long term (current) use of anticoagulants: Secondary | ICD-10-CM

## 2019-11-11 DIAGNOSIS — Z95811 Presence of heart assist device: Secondary | ICD-10-CM

## 2019-11-13 ENCOUNTER — Encounter (HOSPITAL_COMMUNITY): Payer: Medicare Other

## 2019-11-19 ENCOUNTER — Encounter (HOSPITAL_COMMUNITY): Payer: Medicare Other

## 2019-11-24 ENCOUNTER — Ambulatory Visit (HOSPITAL_COMMUNITY): Payer: Self-pay | Admitting: Pharmacist

## 2019-11-24 ENCOUNTER — Other Ambulatory Visit: Payer: Self-pay

## 2019-11-24 ENCOUNTER — Ambulatory Visit (HOSPITAL_COMMUNITY)
Admission: RE | Admit: 2019-11-24 | Discharge: 2019-11-24 | Disposition: A | Discharge status: Home / Self Care | Payer: Medicare Other | Source: Ambulatory Visit | Attending: Internal Medicine | Admitting: Internal Medicine

## 2019-11-24 ENCOUNTER — Encounter (HOSPITAL_COMMUNITY): Payer: Self-pay

## 2019-11-24 ENCOUNTER — Other Ambulatory Visit (HOSPITAL_COMMUNITY): Payer: Self-pay | Admitting: *Deleted

## 2019-11-24 VITALS — BP 108/90 | HR 87 | Wt 264.8 lb

## 2019-11-24 DIAGNOSIS — Z7901 Long term (current) use of anticoagulants: Secondary | ICD-10-CM | POA: Diagnosis present

## 2019-11-24 DIAGNOSIS — I5022 Chronic systolic (congestive) heart failure: Secondary | ICD-10-CM | POA: Diagnosis not present

## 2019-11-24 DIAGNOSIS — Z95811 Presence of heart assist device: Secondary | ICD-10-CM

## 2019-11-24 DIAGNOSIS — I480 Paroxysmal atrial fibrillation: Secondary | ICD-10-CM

## 2019-11-24 DIAGNOSIS — N1831 Chronic kidney disease, stage 3a: Secondary | ICD-10-CM

## 2019-11-24 DIAGNOSIS — I10 Essential (primary) hypertension: Secondary | ICD-10-CM | POA: Diagnosis not present

## 2019-11-24 LAB — COMPREHENSIVE METABOLIC PANEL
ALT: 27 U/L (ref 0–44)
AST: 34 U/L (ref 15–41)
Albumin: 3.7 g/dL (ref 3.5–5.0)
Alkaline Phosphatase: 40 U/L (ref 38–126)
Anion gap: 11 (ref 5–15)
BUN: 23 mg/dL — ABNORMAL HIGH (ref 6–20)
CO2: 24 mmol/L (ref 22–32)
Calcium: 9.7 mg/dL (ref 8.9–10.3)
Chloride: 103 mmol/L (ref 98–111)
Creatinine, Ser: 1.52 mg/dL — ABNORMAL HIGH (ref 0.61–1.24)
GFR, Estimated: 55 mL/min — ABNORMAL LOW (ref >60)
Glucose, Bld: 122 mg/dL — ABNORMAL HIGH (ref 70–99)
Potassium: 4.7 mmol/L (ref 3.5–5.1)
Sodium: 138 mmol/L (ref 135–145)
Total Bilirubin: 0.8 mg/dL (ref 0.3–1.2)
Total Protein: 8.2 g/dL — ABNORMAL HIGH (ref 6.5–8.1)

## 2019-11-24 LAB — CBC
HCT: 49.5 % (ref 39.0–52.0)
Hemoglobin: 15.6 g/dL (ref 13.0–17.0)
MCH: 28.6 pg (ref 26.0–34.0)
MCHC: 31.5 g/dL (ref 30.0–36.0)
MCV: 90.8 fL (ref 80.0–100.0)
Platelets: 288 10*3/uL (ref 150–400)
RBC: 5.45 MIL/uL (ref 4.22–5.81)
RDW: 16.1 % — ABNORMAL HIGH (ref 11.5–15.5)
WBC: 9 10*3/uL (ref 4.0–10.5)
nRBC: 0 % (ref 0.0–0.2)

## 2019-11-24 LAB — PROTIME-INR
INR: 1.6 — ABNORMAL HIGH (ref 0.8–1.2)
Prothrombin Time: 18.3 seconds — ABNORMAL HIGH (ref 11.4–15.2)

## 2019-11-24 LAB — LACTATE DEHYDROGENASE: LDH: 299 U/L — ABNORMAL HIGH (ref 98–192)

## 2019-11-24 LAB — PREALBUMIN: Prealbumin: 41.3 mg/dL — ABNORMAL HIGH (ref 18–38)

## 2019-11-24 NOTE — Progress Notes (Signed)
LVAD INR 

## 2019-11-24 NOTE — Progress Notes (Signed)
LVAD Clinic Note   Primary Cardiologist: Dr. Gala Romney   HPI: Benjamin Sherman is a 49 y.o. 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 ECHO showed severely reduced EF. 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 Placed on steroids without benefit.  Admitted 11/18 after fall/syncopal episode resulting in bifrontal SAH. Neurosurgery consulted.   Admitted 4/19 with cardiogenic shock in setting of AFL. Supported with inotropes and underwent DC-CV but had persistent shock. After numerous discussions about concern for adequate social support underwent placement of HM-3 LVAD on 05/15/17. D/c home 06/04/17  Here for regular f/u. Continues to do very well. Remains active playing golf and other activities without a problem. Has gained a few pounds and says he is eating way more than he should. Denies orthopnea or PND. No fevers, chills or problems with driveline. No bleeding, melena or neuro symptoms. No VAD alarms. Taking all meds as prescribed.     VAD Indication: Destination Therapy - due to limited social support. Pt discussed with Dr. Edwena Blow at [facility] prior to LVAD implant.  LVAD assessment:                                                    Speed: 5300 RPM                                                       Flow: 4.1                                                                      Power: 4.2 w                            PI: 6.7                                                   Alarms: none  Events: 60+ daily  Hct: 20                                                              Fixed speed: 5300 Low speed limit: 5000  Primary Controller: Replace back up battery in29 months.  Back up controller: Expiring. Replaced with [id number] Manufacture: 06/23/19 Exp: 05/22/21 Replace back up battery in 31 months.  Past Medical  History:  Diagnosis Date  . CHF (congestive heart failure) (HCC)   . Diabetes mellitus without complication (HCC)  Current Outpatient Medications  Medication Sig Dispense Refill  . amLODipine (NORVASC) 5 MG tablet Take 2 tablets (10 mg total) by mouth daily. 90 tablet 3  . aspirin 81 MG EC tablet Take 1 tablet (81 mg total) by mouth daily. 90 tablet 3  . ferrous sulfate 325 (65 FE) MG EC tablet Take 1 tablet (325 mg total) by mouth 2 (two) times daily. 180 tablet 1  . glipiZIDE (GLUCOTROL) 5 MG tablet Take 1 tablet (5 mg total) by mouth 2 (two) times daily. Must have office visit for refills 60 tablet 0  . glucose blood (TRUE METRIX BLOOD GLUCOSE TEST) test strip Use daily before breakfast 30 each 12  . pantoprazole (PROTONIX) 40 MG tablet TAKE 1 TABLET BY MOUTH DAILY. 30 tablet 0  . sacubitril-valsartan (ENTRESTO) 49-51 MG Take 1 tablet by mouth 2 (two) times daily. 60 tablet 4  . TRUEPLUS LANCETS 28G MISC 1 each by Does not apply route daily. 30 each 12  . warfarin (COUMADIN) 6 MG tablet TAKE 1 tablet (6mg ) Monday and Friday. Take 1/2 tablet (3mg ) all other days; or as directed by HF clinic 86 tablet 5  . Blood Glucose Monitoring Suppl (TRUE METRIX METER) DEVI 1 each by Does not apply route daily with breakfast. (Patient not taking: Reported on 11/24/2019) 1 Device 0  . enoxaparin (LOVENOX) 40 MG/0.4ML injection Inject 0.4 mLs (40 mg total) into the skin every 12 (twelve) hours. (Patient not taking: Reported on 09/01/2019) 4 mL 0   No current facility-administered medications for this encounter.   Allergies  Allergen Reactions  . Bee Venom     UNSPECIFIED REACTION    Social History   Socioeconomic History  . Marital status: Single    Spouse name: Not on file  . Number of children: Not on file  . Years of education: Not on file  . Highest education level: Not on file  Occupational History  . Occupation: security  Tobacco Use  . Smoking status: Former Smoker    Types: Cigars     Quit date: 11/16/2016    Years since quitting: 3.0  . Smokeless tobacco: Never Used  . Tobacco comment: 6 cigars per wk  Vaping Use  . Vaping Use: Never used  Substance and Sexual Activity  . Alcohol use: Yes    Alcohol/week: 4.0 standard drinks    Types: 2 Shots of liquor, 2 Cans of beer per week    Comment: ocasional  . Drug use: No  . Sexual activity: Not Currently  Other Topics Concern  . Not on file  Social History Narrative  . Not on file   Social Determinants of Health   Financial Resource Strain:   . Difficulty of Paying Living Expenses: Not on file  Food Insecurity:   . Worried About 09/03/2019 in the Last Year: Not on file  . Ran Out of Food in the Last Year: Not on file  Transportation Needs:   . Lack of Transportation (Medical): Not on file  . Lack of Transportation (Non-Medical): Not on file  Physical Activity:   . Days of Exercise per Week: Not on file  . Minutes of Exercise per Session: Not on file  Stress:   . Feeling of Stress : Not on file  Social Connections:   . Frequency of Communication with Friends and Family: Not on file  . Frequency of Social Gatherings with Friends and Family: Not on file  . Attends Religious Services: Not on file  .  Active Member of Clubs or Organizations: Not on file  . Attends Banker Meetings: Not on file  . Marital Status: Not on file  Intimate Partner Violence:   . Fear of Current or Ex-Partner: Not on file  . Emotionally Abused: Not on file  . Physically Abused: Not on file  . Sexually Abused: Not on file    Family History  Problem Relation Age of Onset  . CAD Father    Vitals:   11/24/19 1105 11/24/19 1106  BP: (!) 100/0 108/90  Pulse: 87   Weight: 120.1 kg (264 lb 12.8 oz)     Wt Readings from Last 3 Encounters:  11/24/19 120.1 kg (264 lb 12.8 oz)  09/01/19 119 kg (262 lb 6.4 oz)  05/26/19 121 kg (266 lb 12.8 oz)     Vital Signs:                                                                  Doppler Pressure:100 Automatc BP: 108/90 (97)                               HR: 87                                                  SPO2: 100% on RA                                                      Weight: 264.8 lb w/ eqt  Last weight: 262.4 lb   Physical Exam: General:  NAD.  HEENT: normal  Neck: supple. JVP not elevated.  Carotids 2+ bilat; no bruits. No lymphadenopathy or thryomegaly appreciated. Cor: LVAD hum.  Lungs: Clear. Abdomen: obese soft, nontender, non-distended. No hepatosplenomegaly. No bruits or masses. Good bowel sounds. Driveline site clean. Anchor in place.  Extremities: no cyanosis, clubbing, rash. Warm no edema  Neuro: alert & oriented x 3. No focal deficits. Moves all 4 without problem    ASSESSMENT & PLAN:  1. Chronic Combined Systolic/Diastolic Heart Failure. NICM  - ECHO 4/19 EF 20% CMRI EF 22%  - s/p HM-3 LVAD on 05/15/17 - Now 2.5 years out. NYHA I. Continues to do very well with VAD support. > 1500 feet today - Volume status looks good. No lasix.   2. VAD management.  - VAD interrogated personally. Parameters stable.. - Has frequent PI events at times but no low flows. Reminded him to keep up with fluid intake. No change - LDH 299 - Remains on ASA 81 mg daily + coumadin. -  INR 1.6  INR goal 2.0-2.5 Discussed dosing with PharmD personally.  3. AFL - Maintaining NSR - Has been off amio. - Continue coumadin - No change  4. HTN - MAPs improved after inncreasing amlodipine to 10 but are trending back up - Continue Entresto at 49/51. Can increase to 97/103 as needed -  Will follow  5. Hypothyroidism - improved off amio - TFTs ok.    6. DMII - Per PCP - Doubt he will tolerate SGLT2i with volume issues   7. CKD 3 - Creatinine baseline 1.6-1.8 - Stable at 1.52 today - Continue fluid intake  Total time spent 35 minutes. Over half that time spent discussing above.   [name], MD 11/24/19

## 2019-11-24 NOTE — Progress Notes (Signed)
Patient presents for 2 month follow up with 2.5 yr Intermacs in VAD Clinic today alone. Reports no problems with VAD equipment or concerns with drive line.   Pt says he has no limitations to daily activities. He has been going golfing frequently throughout the week. He has been walking around outside his home. He is no longer walking at the mall due to "a mold issue."  Denies shortness of breath with activity. States he is feeling "pretty good."   Wt is up a few pounds today. He reports he has been "eating a lot more than I should." BP elevated- did not take his medications this morning prior to coming to clinic.   Vital Signs:        Doppler Pressure: 100 Automatc BP: 108/90 (97)    HR: 87      SPO2: 100% on RA       Weight: 264.8 lb w/ eqt  Last weight: 262.4 lb  VAD Indication: Destination Therapy - due to limited social support. Pt discussed with [facility]. Edwena [facility] at [facility] prior to LVAD implant.  LVAD assessment:      Speed: 5300 RPM      Flow: 4.1       Power: 4.2 w       PI: 6.7      Alarms: none  Events: 60+ daily  Hct: 20       Fixed speed: 5300 Low speed limit: 5000  Primary Controller: Replace back up battery in 29 months.  Back up controller: Expiring. Replaced with [id number] Manufacture: 06/23/19 Exp: 05/22/21 Replace back up battery in 31 months.  I reviewed the LVAD parameters from today and compared the results to the patient's prior recorded data. LVAD interrogation was STABLE for significant power changes, NEGATIVE for clinical alarms and STABLE for PI events/speed drops. No programming changes were made and pump is functioning within specified parameters. Pt is performing daily controller and system monitor self tests along with completing weekly and monthly maintenance for LVAD equipment.  LVAD equipment check completed and is in good working order. Back-up equipment present.   Annual maintenance completed on patient's home equipment per [employer] today, 05/26/19.  Exit  Site Care: Gene is doing weekly dressing change weekly. Last dressing change was 2 weeks ago. Reports trauma to exit site while golfing a few weeks ago. He was not wearing his abdominal binder at that time. Existing VAD dressing removed and site care performed using sterile technique. Drive line exit site cleaned with Chlora prep applicators x 2, allowed to dry, and biopatch w/ Sorbaview dressing applied. Exit site healed and partially incorporated, the velour is fully implanted at exit site. Large scab at exit site; came off with cleansing. No drainage, redness, tenderness, foul odor or rash noted. Drive line anchor re-applied. Pt denies fever or chills. Pt provided with 10 anchors today; otherwise has adequate dressing supplies at home.   Device:  N/A  BP & Labs:  Doppler 100 - is reflecting modified systolic  Hgb 15.6 - No S/S of bleeding. Specifically denies melena/BRBPR or nosebleeds.  LDH 299 - and is within established baseline of 200 - 400. Denies tea-colored urine. No power elevations noted on interrogation other than above noted incidents.   2.5 year Intermacs follow up completed including:  Quality of Life, KCCQ-12, and Neurocognitive trail making.   Pt completed 1540 feet during 6 minute walk.  Back up controller: 11V backup battery charged during this visit.   Patient Instructions:  1. No  medication changes today 2. Coumadin dosing per Leotis Shames PharmD 3. Return in 1 week for INR and dressing change 4. Return in 2 months for follow up visit with Dr Gala Romney  Alyce Pagan RN VAD Coordinator  Office: [phone]  24/7 Pager: [phone]

## 2019-11-24 NOTE — Patient Instructions (Signed)
1. No medication changes today 2. Coumadin dosing per Lauren PharmD 3. Return in 1 week for INR and dressing change 4. Return in 2 months for follow up visit with Dr Bensimhon   

## 2019-11-26 ENCOUNTER — Other Ambulatory Visit: Payer: Self-pay | Admitting: Family Medicine

## 2019-11-26 DIAGNOSIS — E1169 Type 2 diabetes mellitus with other specified complication: Secondary | ICD-10-CM

## 2019-11-26 NOTE — Telephone Encounter (Signed)
Requested medication (s) are due for refill today: yes  Requested medication (s) are on the active medication list: yes  Last refill:  10/24/2019  Future visit scheduled:no  Notes to clinic: attempted to contact patient no answer and no vm  Patient is due for a follow up appointment    Requested Prescriptions  Pending Prescriptions Disp Refills   glipiZIDE (GLUCOTROL) 5 MG tablet [Pharmacy Med Name: GLIPIZIDE 5MG  TABLETS] 60 tablet 0    Sig: TAKE 1 TABLET BY MOUTH TWO TIMES DAILY.      Endocrinology:  Diabetes - Sulfonylureas Failed - 11/26/2019  1:14 PM      Failed - HBA1C is between 0 and 7.9 and within 180 days    Hemoglobin A1C  Date Value Ref Range Status  02/26/2019 6.8 (A) 4.0 - 5.6 % Final   HbA1c, POC (controlled diabetic range)  Date Value Ref Range Status  02/19/2018 7.4 (A) 0.0 - 7.0 % Final          Failed - Valid encounter within last 6 months    Recent Outpatient Visits           9 months ago Type 2 diabetes mellitus with other specified complication, without long-term current use of insulin [facility])   [facility] [place], [place], [place]   1 year ago Type 2 diabetes mellitus with other specified complication, without long-term current use of insulin [facility])   [place] [facility] And Well[facility] [place], [place], [place]   1 year ago Medication management   [facility] And [facility]ness [place], KOOMBERKINE, RPH-CPP   1 year ago Type 2 diabetes mellitus with other specified complication, without long-term current use of insulin [facility])   [place] [facility], [place]

## 2019-11-27 ENCOUNTER — Other Ambulatory Visit (HOSPITAL_COMMUNITY): Payer: Self-pay | Admitting: Unknown Physician Specialty

## 2019-11-27 DIAGNOSIS — Z7901 Long term (current) use of anticoagulants: Secondary | ICD-10-CM

## 2019-11-27 DIAGNOSIS — Z95811 Presence of heart assist device: Secondary | ICD-10-CM

## 2019-11-27 NOTE — Telephone Encounter (Signed)
Pt called stating that he will be out of medication today. Please advise.

## 2019-12-01 ENCOUNTER — Other Ambulatory Visit (HOSPITAL_COMMUNITY): Payer: Medicare Other

## 2019-12-22 ENCOUNTER — Ambulatory Visit (HOSPITAL_COMMUNITY)
Admission: RE | Admit: 2019-12-22 | Discharge: 2019-12-22 | Disposition: A | Discharge status: Home / Self Care | Payer: Medicare Other | Source: Ambulatory Visit | Attending: Internal Medicine | Admitting: Internal Medicine

## 2019-12-22 ENCOUNTER — Ambulatory Visit (HOSPITAL_COMMUNITY): Payer: Self-pay | Admitting: Pharmacist

## 2019-12-22 ENCOUNTER — Other Ambulatory Visit: Payer: Self-pay | Admitting: Unknown Physician Specialty

## 2019-12-22 ENCOUNTER — Other Ambulatory Visit: Payer: Self-pay

## 2019-12-22 DIAGNOSIS — Z7901 Long term (current) use of anticoagulants: Secondary | ICD-10-CM | POA: Diagnosis not present

## 2019-12-22 DIAGNOSIS — Z95811 Presence of heart assist device: Secondary | ICD-10-CM

## 2019-12-22 DIAGNOSIS — Z4801 Encounter for change or removal of surgical wound dressing: Secondary | ICD-10-CM | POA: Insufficient documentation

## 2019-12-22 LAB — PROTIME-INR
INR: 1.5 — ABNORMAL HIGH (ref 0.8–1.2)
Prothrombin Time: 17.9 seconds — ABNORMAL HIGH (ref 11.4–15.2)

## 2019-12-22 NOTE — Addendum Note (Signed)
Encounter addended by: Bernita Raisin, RN on: 12/22/2019 11:12 AM  Actions taken: Clinical Note Signed

## 2019-12-22 NOTE — Progress Notes (Signed)
LVAD INR 

## 2019-12-22 NOTE — Progress Notes (Signed)
Patient presents to clinic today for drive line exit wound care. Existing VAD dressing removed and site care performed using sterile technique. Drive line exit site cleaned with Chlora prep applicators x 2, allowed to dry, and Sorbaview with bio-patch re-applied. Exit site with partial incorporation; No redness, tenderness, drainage, or foul odor noted. Rash noted under previous anchor site. Repositioned/replaced anchor. Advised him of importance of weekly dressing changes and keeping DL secure.  Pt provided with 4 weekly dressing kits for home use. Instructed to bring to clinic with him for weekly dressing change.   Return in 1 week for INR and dressing change per protocol.   Alyce Pagan RN VAD Coordinator  Office: [phone]  24/7 Pager: [phone]

## 2019-12-26 ENCOUNTER — Other Ambulatory Visit (HOSPITAL_COMMUNITY): Payer: Self-pay

## 2019-12-26 ENCOUNTER — Other Ambulatory Visit (HOSPITAL_COMMUNITY): Payer: Self-pay | Admitting: Unknown Physician Specialty

## 2019-12-26 ENCOUNTER — Telehealth: Payer: Self-pay | Admitting: Family Medicine

## 2019-12-26 DIAGNOSIS — E1169 Type 2 diabetes mellitus with other specified complication: Secondary | ICD-10-CM

## 2019-12-26 DIAGNOSIS — Z95811 Presence of heart assist device: Secondary | ICD-10-CM

## 2019-12-26 DIAGNOSIS — Z7901 Long term (current) use of anticoagulants: Secondary | ICD-10-CM

## 2019-12-26 MED ORDER — ENTRESTO 49-51 MG PO TABS: 1.0000 | ORAL_TABLET | Freq: Two times a day (BID) | ORAL | 4 refills | Status: DC

## 2019-12-26 NOTE — Telephone Encounter (Signed)
Patient called and advised he will need to schedule and OV before any more refills due to his last cancellation. He agreed. Appointment scheduled for Thursday, 01/15/20 at 1030 with Georgian Co, PA-C. Advised enough will be sent to cover until the appointment, patient verbalized understanding.

## 2019-12-29 ENCOUNTER — Ambulatory Visit (HOSPITAL_COMMUNITY): Payer: Self-pay | Admitting: Pharmacist

## 2019-12-29 ENCOUNTER — Ambulatory Visit (HOSPITAL_COMMUNITY)
Admission: RE | Admit: 2019-12-29 | Discharge: 2019-12-29 | Disposition: A | Discharge status: Home / Self Care | Payer: Medicare Other | Source: Ambulatory Visit | Attending: Internal Medicine | Admitting: Internal Medicine

## 2019-12-29 ENCOUNTER — Other Ambulatory Visit: Payer: Self-pay

## 2019-12-29 DIAGNOSIS — Z7901 Long term (current) use of anticoagulants: Secondary | ICD-10-CM | POA: Diagnosis present

## 2019-12-29 DIAGNOSIS — Z95811 Presence of heart assist device: Secondary | ICD-10-CM | POA: Diagnosis present

## 2019-12-29 LAB — PROTIME-INR
INR: 1.9 — ABNORMAL HIGH (ref 0.8–1.2)
Prothrombin Time: 21.1 seconds — ABNORMAL HIGH (ref 11.4–15.2)

## 2019-12-29 NOTE — Progress Notes (Signed)
Patient presents to clinic today for drive line exit wound care. Existing VAD dressing removed and site care performed using sterile technique. Drive line exit site cleaned with Chlora prep applicators x 2, allowed to dry, and Sorbaview with bio-patch re-applied. Exit site with partial incorporation; No redness, tenderness, drainage, or foul odor noted. Rash noted under previous anchor site. Repositioned/replaced anchor. Advised him of importance of weekly dressing changes and keeping DL secure.  Pt has adequate dressing supplies at home.   Return in 1 week for INR and dressing change per protocol.   Alyce Pagan RN VAD Coordinator  Office: [phone]  24/7 Pager: [phone]

## 2019-12-29 NOTE — Progress Notes (Signed)
LVAD INR 

## 2019-12-29 NOTE — Addendum Note (Signed)
Encounter addended by: Bernita Raisin, RN on: 12/29/2019 10:17 AM  Actions taken: Clinical Note Signed

## 2020-01-01 IMAGING — CR DG CHEST 2V
2 series · 2 of 2 positions shown · non-contrast
Comparison: 12/11/2016 and older exams.

CLINICAL DATA: Pt states shortness of breath with edema to
bilateral legs since [REDACTED], pt is supposed to be taking lasix but
"is not sure why he is not currently taking it" . Hx of diabetes and
CHF. Pt states he has also had some N/V yesterday but not currently.
Pt denies any CP.

EXAM:
CHEST - 2 VIEW

[chest pa]
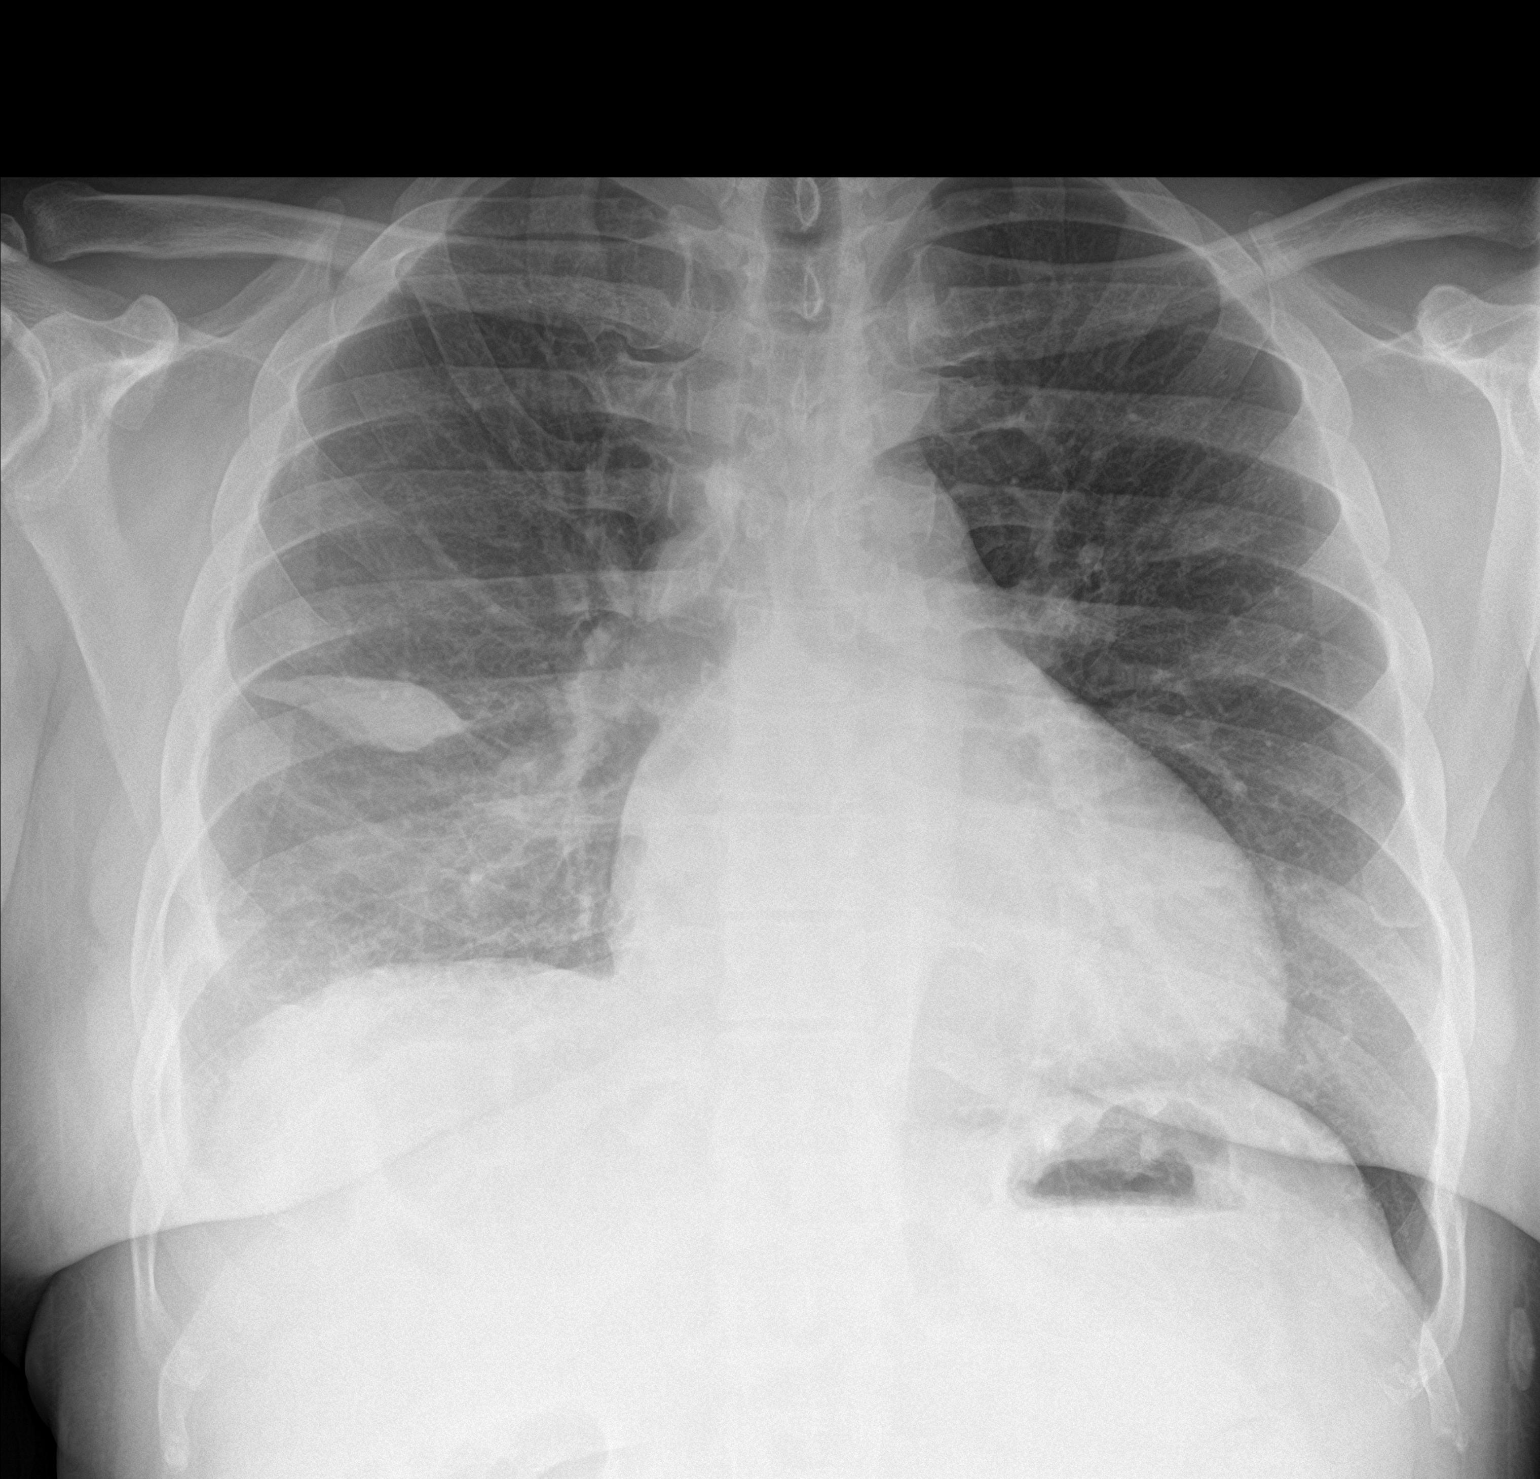

[chest lat]
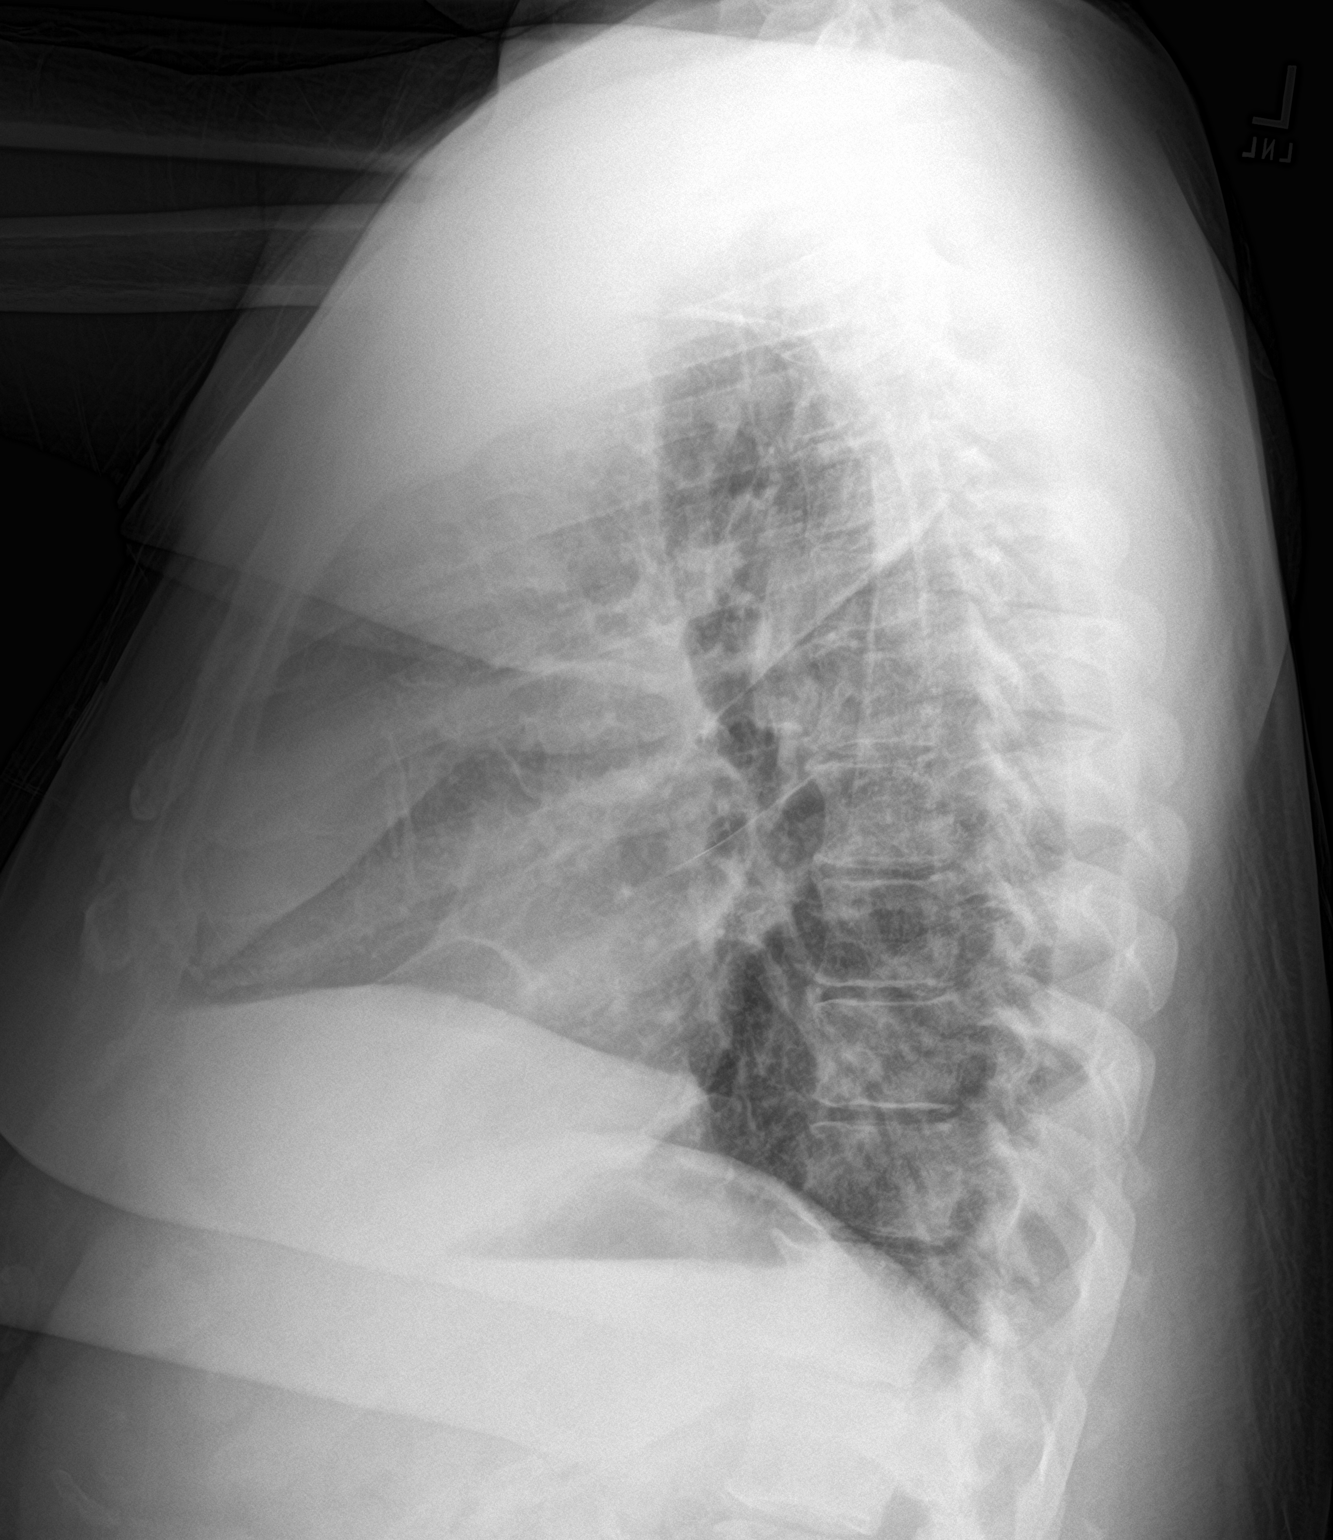

[2 of 2 positions shown; findings below may reference images not displayed]

FINDINGS: Heart is top-normal in size. No mediastinal or hilar masses. No
evidence of adenopathy.

There is an oval density in the right mid lung consistent with fluid
within the minor fissure. Mild prominence of the bronchovascular
markings is noted in the lower lungs. Lungs are otherwise clear with
no evidence of pneumonia and no convincing pulmonary edema.

No pleural effusion or pneumothorax.

Skeletal structures are intact.
IMPRESSION: 1. Small amount of fluid within the minor fissure, new since the
prior study.
2. No evidence of pneumonia.  No pulmonary edema.

## 2020-01-02 ENCOUNTER — Other Ambulatory Visit (HOSPITAL_COMMUNITY): Payer: Self-pay | Admitting: *Deleted

## 2020-01-02 DIAGNOSIS — Z7901 Long term (current) use of anticoagulants: Secondary | ICD-10-CM

## 2020-01-02 DIAGNOSIS — Z95811 Presence of heart assist device: Secondary | ICD-10-CM

## 2020-01-05 ENCOUNTER — Other Ambulatory Visit (HOSPITAL_COMMUNITY): Payer: Medicare Other

## 2020-01-05 ENCOUNTER — Encounter (HOSPITAL_COMMUNITY): Payer: Self-pay | Admitting: *Deleted

## 2020-01-05 ENCOUNTER — Telehealth (HOSPITAL_COMMUNITY): Payer: Self-pay | Admitting: *Deleted

## 2020-01-05 NOTE — Telephone Encounter (Signed)
Called patient re: missed appointment today for INR and dressing change. No answer, unable to leave message (VM not set up). Letter sent.  [name] RN, VAD Coordinator [phone]

## 2020-01-07 ENCOUNTER — Telehealth (HOSPITAL_COMMUNITY): Payer: Self-pay | Admitting: Pharmacy Technician

## 2020-01-07 NOTE — Telephone Encounter (Signed)
Sent in [employer] application via fax.  Will follow up.  

## 2020-01-13 NOTE — Progress Notes (Deleted)
Patient ID: Benjamin Sherman, male   DOB: 09-21-1970, 49 y.o.   MRN: [id number]

## 2020-01-15 ENCOUNTER — Ambulatory Visit: Payer: Medicare Other | Admitting: Physician Assistant

## 2020-01-19 ENCOUNTER — Other Ambulatory Visit: Payer: Self-pay | Admitting: Family Medicine

## 2020-01-19 DIAGNOSIS — E1169 Type 2 diabetes mellitus with other specified complication: Secondary | ICD-10-CM

## 2020-01-21 NOTE — Telephone Encounter (Signed)
PT schedule an appt for 01/26/20 for his meds refill / he's asking if they can send his prescription / please advise

## 2020-01-22 MED ORDER — GLIPIZIDE 5 MG PO TABS: 5.0000 mg | ORAL_TABLET | Freq: Two times a day (BID) | ORAL | 0 refills | Status: DC

## 2020-01-22 NOTE — Addendum Note (Signed)
Addended by: Lois Huxley, Jeannett Senior L on: 01/22/2020 11:59 AM   Modules accepted: Orders

## 2020-01-22 NOTE — Telephone Encounter (Signed)
Rx sent 

## 2020-01-23 ENCOUNTER — Other Ambulatory Visit (HOSPITAL_COMMUNITY): Payer: Self-pay | Admitting: *Deleted

## 2020-01-23 DIAGNOSIS — I5022 Chronic systolic (congestive) heart failure: Secondary | ICD-10-CM

## 2020-01-23 DIAGNOSIS — Z95811 Presence of heart assist device: Secondary | ICD-10-CM

## 2020-01-23 DIAGNOSIS — Z7901 Long term (current) use of anticoagulants: Secondary | ICD-10-CM

## 2020-01-26 ENCOUNTER — Ambulatory Visit (HOSPITAL_COMMUNITY)
Admission: RE | Admit: 2020-01-26 | Discharge: 2020-01-26 | Disposition: A | Discharge status: Home / Self Care | Payer: Medicare Other | Source: Ambulatory Visit | Attending: Internal Medicine | Admitting: Internal Medicine

## 2020-01-26 ENCOUNTER — Ambulatory Visit (HOSPITAL_COMMUNITY): Payer: Self-pay | Admitting: Pharmacist

## 2020-01-26 ENCOUNTER — Other Ambulatory Visit: Payer: Self-pay

## 2020-01-26 VITALS — BP 110/0 | HR 84 | Ht 72.0 in | Wt 262.2 lb

## 2020-01-26 DIAGNOSIS — I5022 Chronic systolic (congestive) heart failure: Secondary | ICD-10-CM

## 2020-01-26 DIAGNOSIS — Z4509 Encounter for adjustment and management of other cardiac device: Secondary | ICD-10-CM | POA: Insufficient documentation

## 2020-01-26 DIAGNOSIS — I428 Other cardiomyopathies: Secondary | ICD-10-CM | POA: Diagnosis not present

## 2020-01-26 DIAGNOSIS — E1122 Type 2 diabetes mellitus with diabetic chronic kidney disease: Secondary | ICD-10-CM | POA: Insufficient documentation

## 2020-01-26 DIAGNOSIS — N183 Chronic kidney disease, stage 3 unspecified: Secondary | ICD-10-CM | POA: Insufficient documentation

## 2020-01-26 DIAGNOSIS — Z87891 Personal history of nicotine dependence: Secondary | ICD-10-CM | POA: Diagnosis not present

## 2020-01-26 DIAGNOSIS — Z8249 Family history of ischemic heart disease and other diseases of the circulatory system: Secondary | ICD-10-CM | POA: Insufficient documentation

## 2020-01-26 DIAGNOSIS — I130 Hypertensive heart and chronic kidney disease with heart failure and stage 1 through stage 4 chronic kidney disease, or unspecified chronic kidney disease: Secondary | ICD-10-CM | POA: Diagnosis not present

## 2020-01-26 DIAGNOSIS — Z7984 Long term (current) use of oral hypoglycemic drugs: Secondary | ICD-10-CM | POA: Diagnosis not present

## 2020-01-26 DIAGNOSIS — Z79899 Other long term (current) drug therapy: Secondary | ICD-10-CM | POA: Diagnosis not present

## 2020-01-26 DIAGNOSIS — I5042 Chronic combined systolic (congestive) and diastolic (congestive) heart failure: Secondary | ICD-10-CM | POA: Insufficient documentation

## 2020-01-26 DIAGNOSIS — Z95811 Presence of heart assist device: Secondary | ICD-10-CM

## 2020-01-26 DIAGNOSIS — I480 Paroxysmal atrial fibrillation: Secondary | ICD-10-CM | POA: Diagnosis not present

## 2020-01-26 DIAGNOSIS — Z7982 Long term (current) use of aspirin: Secondary | ICD-10-CM | POA: Insufficient documentation

## 2020-01-26 DIAGNOSIS — Z7901 Long term (current) use of anticoagulants: Secondary | ICD-10-CM | POA: Diagnosis not present

## 2020-01-26 DIAGNOSIS — E1169 Type 2 diabetes mellitus with other specified complication: Secondary | ICD-10-CM

## 2020-01-26 DIAGNOSIS — I10 Essential (primary) hypertension: Secondary | ICD-10-CM

## 2020-01-26 LAB — BASIC METABOLIC PANEL
Anion gap: 10 (ref 5–15)
BUN: 24 mg/dL — ABNORMAL HIGH (ref 6–20)
CO2: 24 mmol/L (ref 22–32)
Calcium: 9 mg/dL (ref 8.9–10.3)
Chloride: 99 mmol/L (ref 98–111)
Creatinine, Ser: 1.5 mg/dL — ABNORMAL HIGH (ref 0.61–1.24)
GFR, Estimated: 56 mL/min — ABNORMAL LOW (ref >60)
Glucose, Bld: 183 mg/dL — ABNORMAL HIGH (ref 70–99)
Potassium: 4.6 mmol/L (ref 3.5–5.1)
Sodium: 133 mmol/L — ABNORMAL LOW (ref 135–145)

## 2020-01-26 LAB — CBC
HCT: 43.4 % (ref 39.0–52.0)
Hemoglobin: 14.5 g/dL (ref 13.0–17.0)
MCH: 29.1 pg (ref 26.0–34.0)
MCHC: 33.4 g/dL (ref 30.0–36.0)
MCV: 87 fL (ref 80.0–100.0)
Platelets: 270 10*3/uL (ref 150–400)
RBC: 4.99 MIL/uL (ref 4.22–5.81)
RDW: 15.4 % (ref 11.5–15.5)
WBC: 7.8 10*3/uL (ref 4.0–10.5)
nRBC: 0 % (ref 0.0–0.2)

## 2020-01-26 LAB — PROTIME-INR
INR: 1.6 — ABNORMAL HIGH (ref 0.8–1.2)
Prothrombin Time: 18.5 seconds — ABNORMAL HIGH (ref 11.4–15.2)

## 2020-01-26 LAB — LACTATE DEHYDROGENASE: LDH: 249 U/L — ABNORMAL HIGH (ref 98–192)

## 2020-01-26 MED ORDER — GLIPIZIDE 5 MG PO TABS: 5.0000 mg | ORAL_TABLET | Freq: Two times a day (BID) | ORAL | 11 refills | Status: DC

## 2020-01-26 MED ORDER — PANTOPRAZOLE SODIUM 40 MG PO TBEC: 40.0000 mg | DELAYED_RELEASE_TABLET | Freq: Every day | ORAL | 11 refills | Status: DC

## 2020-01-26 NOTE — Progress Notes (Signed)
LVAD Clinic Note   Primary Cardiologist: Dr. Gala Romney   HPI: Benjamin Sherman is a 50 y.o. 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 ECHO showed severely reduced EF. 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 Placed on steroids without benefit.  Admitted 11/18 after fall/syncopal episode resulting in bifrontal SAH. Neurosurgery consulted.   Admitted 4/19 with cardiogenic shock in setting of AFL. Supported with inotropes and underwent DC-CV but had persistent shock. After numerous discussions about concern for adequate social support underwent placement of HM-3 LVAD on 05/15/17. D/c home 06/04/17  Here for regular f/u. Doing very well. Was playing golf regularly but has not played much over past few weeks due to the cold. . Denies orthopnea or PND. No fevers, chills or problems with driveline. No bleeding, melena or neuro symptoms. No VAD alarms. Taking all meds as prescribed.    VAD Indication: Destination Therapy - due to limited social support. Pt discussed with Dr. Edwena Blow at [facility] prior to LVAD implant.  LVAD assessment:                                                    Speed: 5300 RPM                                                       Flow: 4.3                                                                      Power: 4.0 w                            PI: 5.8                                                   Alarms: none  Events: 60+ daily  Hct: 20                                                              Fixed speed: 5300 Low speed limit: 5000  Primary Controller: Replace back up battery in24 months.  Back up controller: Replace back up battery in 29 months. ]  Past Medical History:  Diagnosis Date  . CHF (congestive heart failure) (HCC)   . Diabetes mellitus without complication (HCC)      Current Outpatient Medications  Medication Sig Dispense Refill  . amLODipine (NORVASC) 5 MG tablet Take  2 tablets (10 mg total) by mouth daily. 90 tablet 3  . aspirin 81 MG EC tablet Take 1 tablet (81 mg total) by mouth daily. 90 tablet 3  . Blood Glucose Monitoring Suppl (TRUE METRIX METER) DEVI 1 each by Does not apply route daily with breakfast. 1 Device 0  . ferrous sulfate 325 (65 FE) MG EC tablet Take 1 tablet (325 mg total) by mouth 2 (two) times daily. 180 tablet 1  . sacubitril-valsartan (ENTRESTO) 49-51 MG Take 1 tablet by mouth 2 (two) times daily. 60 tablet 4  . warfarin (COUMADIN) 6 MG tablet TAKE 1 tablet (6mg ) Monday and Friday. Take 1/2 tablet (3mg ) all other days; or as directed by HF clinic 86 tablet 5  . enoxaparin (LOVENOX) 40 MG/0.4ML injection Inject 0.4 mLs (40 mg total) into the skin every 12 (twelve) hours. (Patient not taking: No sig reported) 4 mL 0  . glipiZIDE (GLUCOTROL) 5 MG tablet Take 1 tablet (5 mg total) by mouth 2 (two) times daily. MUST KEEP SCHEDULED APPOINTMENT FOR ADDITIONAL REFILLS 60 tablet 11  . glucose blood (TRUE METRIX BLOOD GLUCOSE TEST) test strip Use daily before breakfast (Patient not taking: Reported on 01/26/2020) 30 each 12  . pantoprazole (PROTONIX) 40 MG tablet Take 1 tablet (40 mg total) by mouth daily. 30 tablet 11  . TRUEPLUS LANCETS 28G MISC 1 each by Does not apply route daily. (Patient not taking: Reported on 01/26/2020) 30 each 12   No current facility-administered medications for this encounter.   Allergies  Allergen Reactions  . Bee Venom     UNSPECIFIED REACTION    Social History   Socioeconomic History  . Marital status: Single    Spouse name: Not on file  . Number of children: Not on file  . Years of education: Not on file  . Highest education level: Not on file  Occupational History  . Occupation: security  Tobacco Use  . Smoking status: Former Smoker    Types: Cigars    Quit date: 11/16/2016    Years since  quitting: 3.1  . Smokeless tobacco: Never Used  . Tobacco comment: 6 cigars per wk  Vaping Use  . Vaping Use: Never used  Substance and Sexual Activity  . Alcohol use: Yes    Alcohol/week: 4.0 standard drinks    Types: 2 Shots of liquor, 2 Cans of beer per week    Comment: ocasional  . Drug use: No  . Sexual activity: Not Currently  Other Topics Concern  . Not on file  Social History Narrative  . Not on file   Social Determinants of Health   Financial Resource Strain: Not on file  Food Insecurity: Not on file  Transportation Needs: Not on file  Physical Activity: Not on file  Stress: Not on file  Social Connections: Not on file  Intimate Partner Violence: Not on file    Family History  Problem Relation Age of Onset  . CAD Father    Vitals:   01/26/20 1049 01/26/20 1050  BP: (!) 110/0   Pulse:  84  SpO2:  99%  Weight:  118.9 kg (262 lb 3.2 oz)  Height:  6' (1.829 m)    Wt Readings from Last 3 Encounters:  01/26/20 118.9 kg (262 lb 3.2 oz)  11/24/19 120.1 kg (264 lb 12.8 oz)  09/01/19 119 kg (262 lb 6.4 oz)     Vital Signs:  Doppler Pressure:110 Automatc BP: 118/92 (101)                             HR: 84                                                  SPO2: 99% on RA                                                        Weight: 262.2 lb w/ eqt  Last weight: 264.8 lb  Physical Exam: General:  NAD.  HEENT: normal  Neck: supple. JVP not elevated.  Carotids 2+ bilat; no bruits. No lymphadenopathy or thryomegaly appreciated. Cor: LVAD hum.  Lungs: Clear. Abdomen: obese soft, nontender, non-distended. No hepatosplenomegaly. No bruits or masses. Good bowel sounds. Driveline site clean. Anchor in place.  Extremities: no cyanosis, clubbing, rash. Warm no edema  Neuro: alert & oriented x 3. No focal deficits. Moves all 4 without problem     ASSESSMENT & PLAN:  1. Chronic Combined  Systolic/Diastolic Heart Failure. NICM  - ECHO 4/19 EF 20% CMRI EF 22%  - s/p HM-3 LVAD on 05/15/17 - Now >2.5 years out. NYHA I. Doing very well with VAD support - Volume status ok. Off lasix.   2. VAD management.  - VAD interrogated personally. Parameters stable. - Has frequent PI events at times but no low flows. Reminded him to keep up with fluid intake. No change - LDH 249 - Remains on ASA 81 mg daily + coumadin. - INR 1.6  INR goal 2.0-2.5 Discussed dosing with PharmD personally.  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 - Continue coumadin - No change  4. HTN - MAPs back up  - Continue Entresto at 49/51. Can increase to 97/103 if BP remains elevated - Encouraged him to keep his weight down   5. Hypothyroidism - improved off amio - TFTs ok.    6. DMII - Per PCP - Doubt he will tolerate SGLT2i with volume issues.   7. CKD 3 - Creatinine baseline 1.5-1.8 - Stable at 1.50 today  Total time spent 35 minutes. Over half that time spent discussing above.   [name], MD 01/26/20

## 2020-01-26 NOTE — Progress Notes (Signed)
LVAD INR 

## 2020-01-26 NOTE — Progress Notes (Signed)
Patient presents for 2 month follow up with 2.5 yr Intermacs in VAD Clinic today alone. Reports no problems with VAD equipment or concerns with drive line.   Pt says he has no limitations to daily activities. He has been doing a lot of reading but is not really getting any physical activity due to the weather. States he is feeling "pretty good."   Vital Signs:        Doppler Pressure: 110 Automatc BP: 118/92 (101)    HR: 84      SPO2: 99% on RA       Weight: 262.2 lb w/ eqt  Last weight: 264.8 lb  VAD Indication: Destination Therapy - due to limited social support. Pt discussed with Dr. Edwena Blow at [facility] prior to LVAD implant.  LVAD assessment:      Speed: 5300 RPM      Flow: 4.3       Power: 4.0 w       PI: 5.8      Alarms: none  Events: 60+ daily  Hct: 20       Fixed speed: 5300 Low speed limit: 5000  Primary Controller: Replace back up battery in 24 months.  Back up controller: Replace back up battery in 29 months.  I reviewed the LVAD parameters from today and compared the results to the patient's prior recorded data. LVAD interrogation was STABLE for significant power changes, NEGATIVE for clinical alarms and STABLE for PI events/speed drops. No programming changes were made and pump is functioning within specified parameters. Pt is performing daily controller and system monitor self tests along with completing weekly and monthly maintenance for LVAD equipment.  LVAD equipment check completed and is in good working order. Back-up equipment present.   Annual maintenance completed on patient's home equipment per [employer] today, 05/26/19.  Exit Site Care: Benjamin Sherman is doing weekly dressing change weekly. Last dressing change was 2 weeks ago. Existing VAD dressing removed and site care performed using sterile technique. Drive line exit site cleaned with Chlora prep applicators x 2, allowed to dry, and biopatch w/ Sorbaview dressing applied. Exit site healed and partially  incorporated, the velour is fully implanted at exit site. No drainage, redness, tenderness, foul odor or rash noted. Drive line anchor re-applied. Pt denies fever or chills. Pt provided with 4 kits today; otherwise has adequate dressing supplies at home.   Device:  N/A  BP & Labs:  Doppler 110 - is reflecting modified systolic  Hgb 14.5 - No S/S of bleeding. Specifically denies melena/BRBPR or nosebleeds.  LDH 249 - and is within established baseline of 200 - 400. Denies tea-colored urine. No power elevations noted on interrogation other than above noted incidents.   Patient Instructions:  1. No medication changes today 2. Coumadin dosing per Leotis Shames PharmD 3. Return in 1 week for INR and dressing change 4. Return in 2 months for follow up visit with Dr Gala Romney  [name] RN VAD Coordinator  Office: [phone]  24/7 Pager: [phone]

## 2020-01-26 NOTE — Patient Instructions (Signed)
1. No medication changes today 2. Coumadin dosing per Leotis Shames PharmD 3. Return in 1 week for INR and dressing change 4. Return in 2 months for follow up visit with Dr Gala Romney

## 2020-01-28 IMAGING — CT CT ANGIO CHEST
2 of 6 series · 18 of 46 positions shown · IV contrast (Isovue)
Comparison: Prior chest x-rays including earlier today.

CLINICAL DATA: Shortness of breath and history of congestive heart
failure.

EXAM:
CT ANGIOGRAPHY CHEST WITH CONTRAST
TECHNIQUE: Multidetector CT imaging of the chest was performed using the
standard protocol during bolus administration of intravenous
contrast. Multiplanar CT image reconstructions and MIPs were
obtained to evaluate the vascular anatomy.
CONTRAST:  100mL [id number] IOPAMIDOL ([id number]) INJECTION 76%

[Series 5: thins · axial · 0.78mm/px · z∈[+1269,+1562]mm · 15 of 321 slices shown]
[im 14/321  lung]
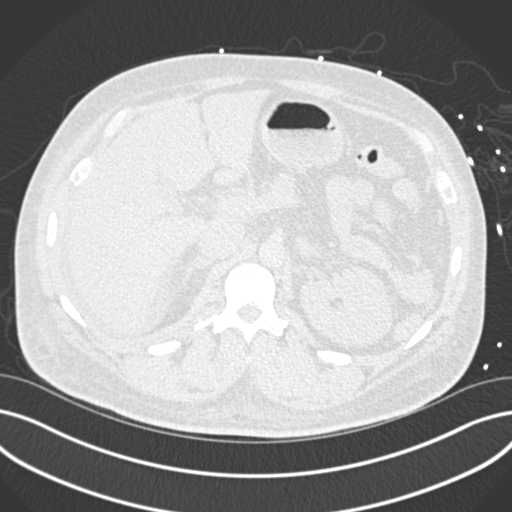
[im 42/321  soft-tissue]
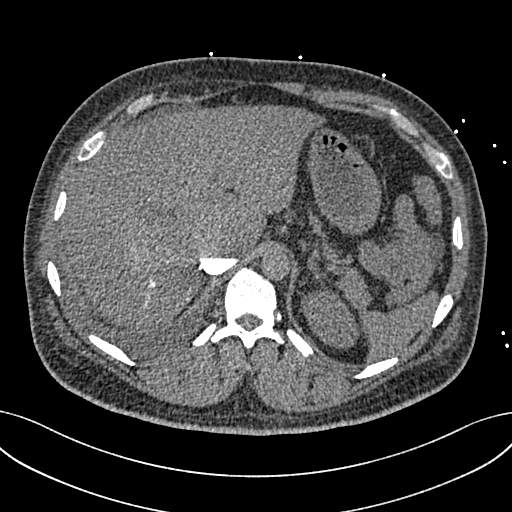
[im 56/321  lung]
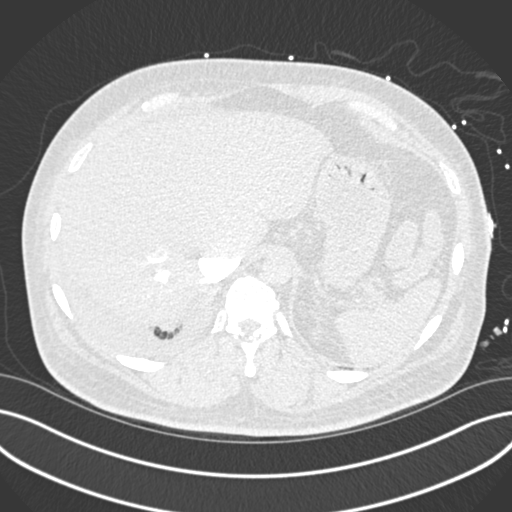
[im 84/321  soft-tissue]
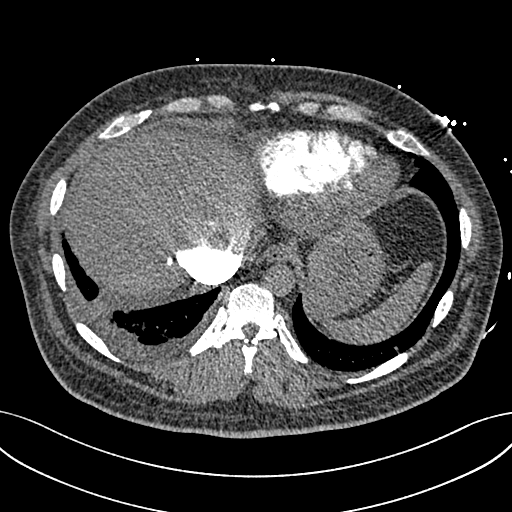
[im 98/321  lung]
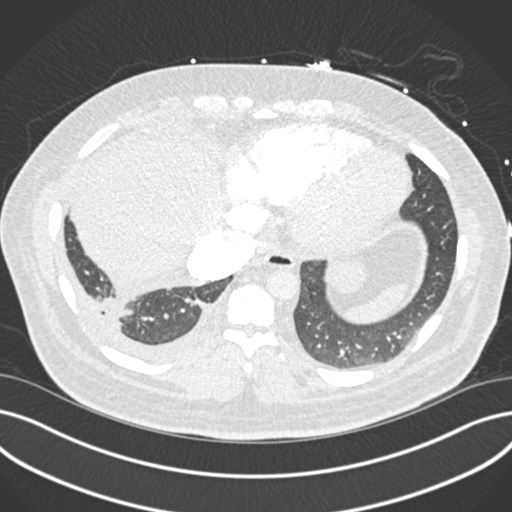
[im 126/321  soft-tissue]
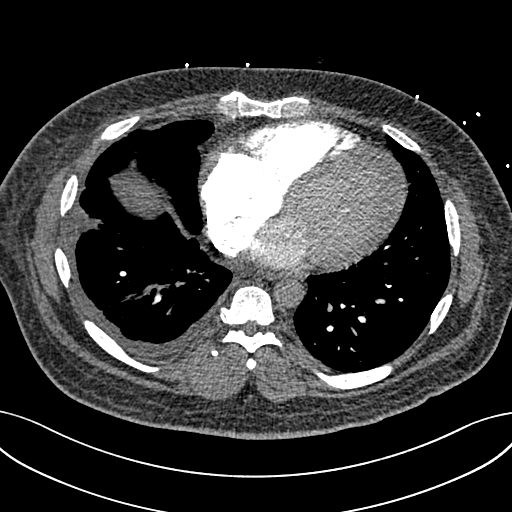
[im 140/321  lung]
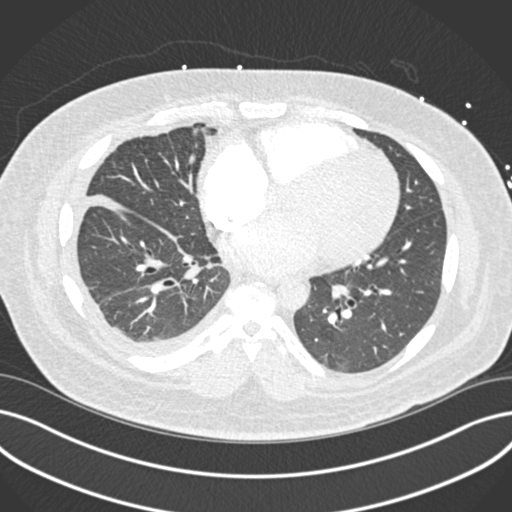
[im 167/321  soft-tissue]
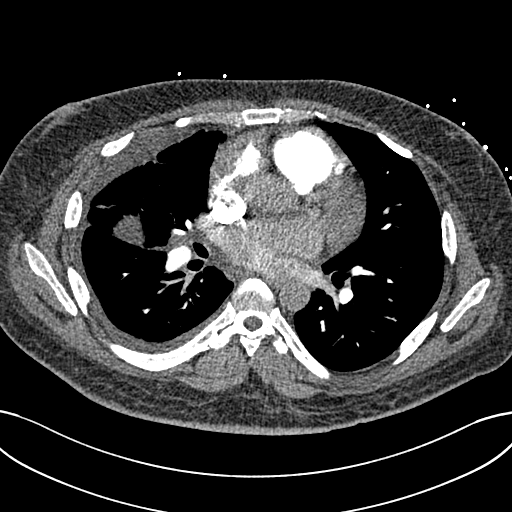
[im 181/321  lung]
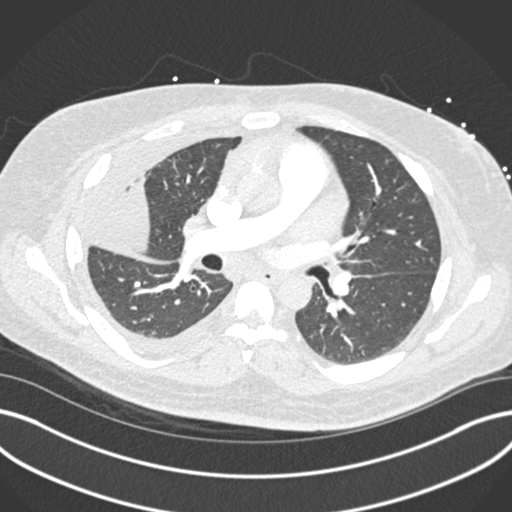
[im 195/321  soft-tissue]
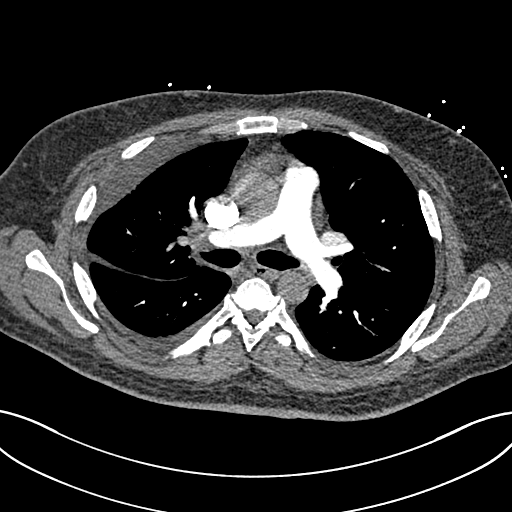
[im 223/321  lung]
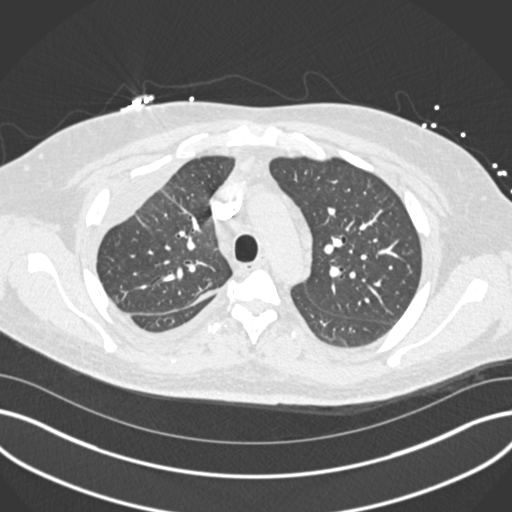
[im 237/321  soft-tissue]
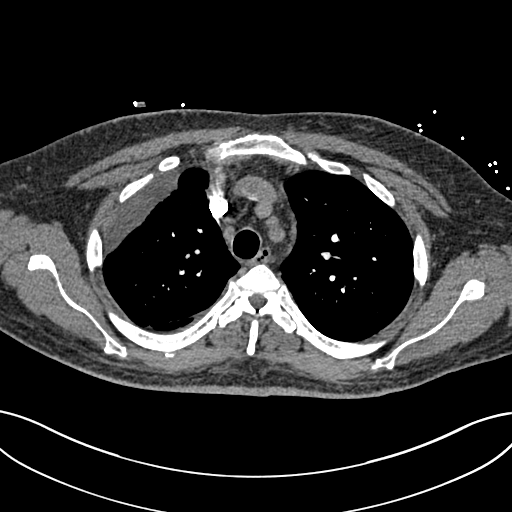
[im 265/321  lung]
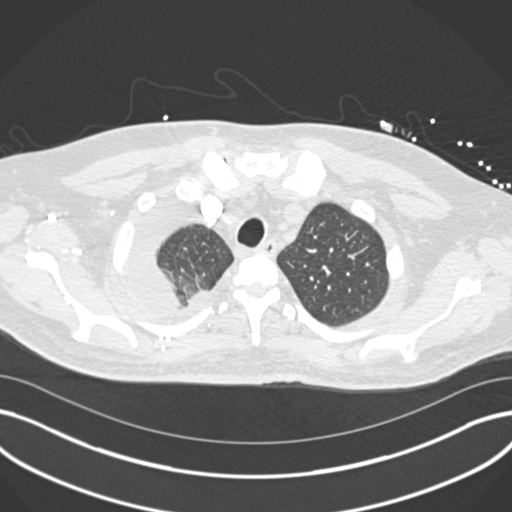
[im 279/321  soft-tissue]
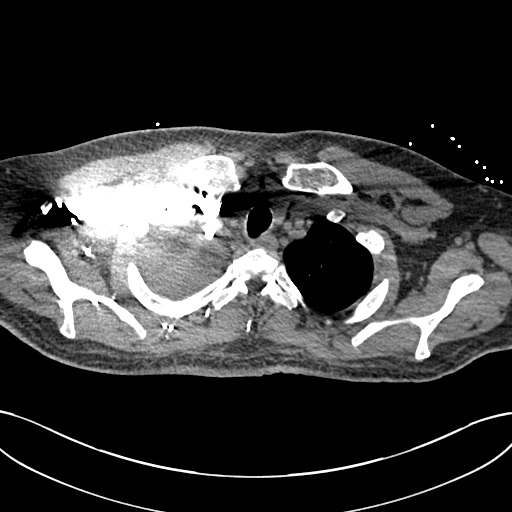
[im 307/321  lung]
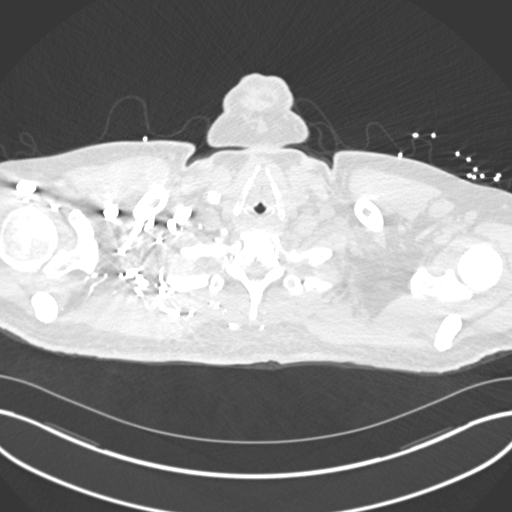

[Series 7: coronal mpr · coronal · 0.69mm/px · 3 of 151 slices shown]
[im 38/151  soft-tissue]
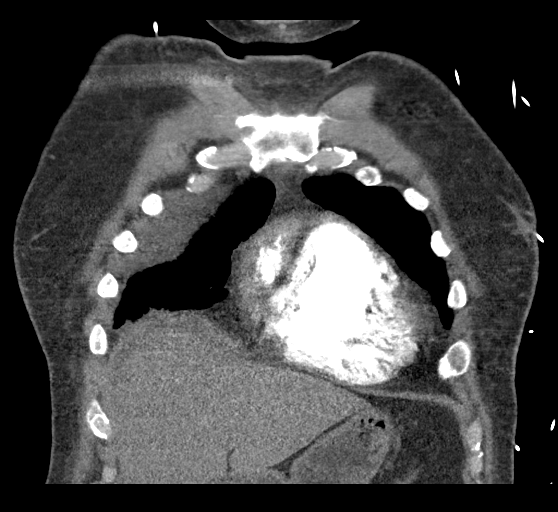
[im 76/151  soft-tissue]
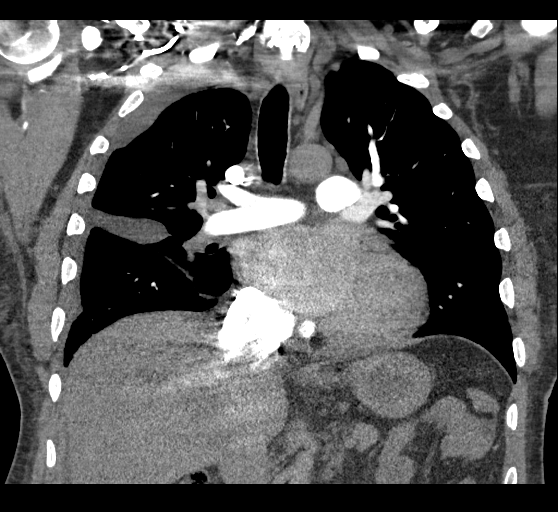
[im 113/151  soft-tissue]
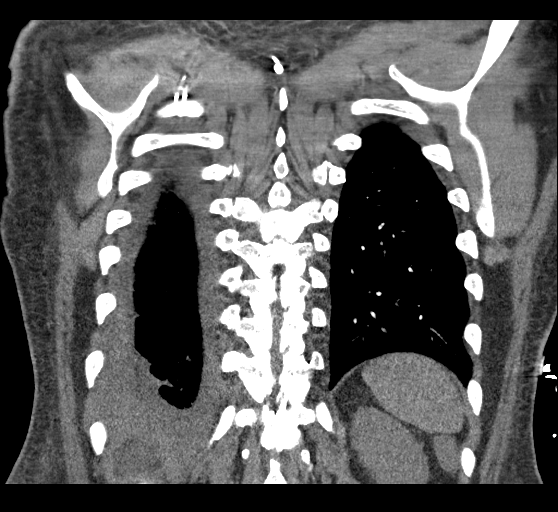

[18 of 46 positions shown; findings below may reference images not displayed]

FINDINGS: Cardiovascular: Satisfactory opacification of the pulmonary arteries
to the segmental level. No evidence of pulmonary embolism. Normal
heart size. No pericardial effusion. There is reflux of contrast
into the intrahepatic IVC and hepatic veins consistent with elevated
right heart pressures and right heart failure.

Mediastinum/Nodes: There are some scattered small, nonenlarged lymph
nodes in the mediastinum. No mediastinal masses.

Lungs/Pleura: Small amount of right pleural fluid is present with
some fluid in the minor fissure and some fluid extending over the
apex of the lung. Lungs show no evidence of overt edema, focal
airspace consolidation, pneumothorax or nodule. There are scattered
areas of scarring and atelectasis in both lungs.

Upper Abdomen: No acute abnormality.

Musculoskeletal: No chest wall abnormality. No acute or significant
osseous findings.

Review of the MIP images confirms the above findings.
IMPRESSION: 1. No evidence of pulmonary embolism.
2. Small amount of right pleural fluid with some fluid extending
into the minor fissure as well as over the right apex.
3. No evidence of right heart failure and elevated right heart
pressures with reflux of contrast into the IVC and hepatic veins.

## 2020-01-30 ENCOUNTER — Other Ambulatory Visit (HOSPITAL_COMMUNITY): Payer: Self-pay | Admitting: Unknown Physician Specialty

## 2020-01-30 DIAGNOSIS — Z7901 Long term (current) use of anticoagulants: Secondary | ICD-10-CM

## 2020-01-30 DIAGNOSIS — Z95811 Presence of heart assist device: Secondary | ICD-10-CM

## 2020-02-05 ENCOUNTER — Ambulatory Visit (HOSPITAL_COMMUNITY): Payer: Self-pay | Admitting: Pharmacist

## 2020-02-05 ENCOUNTER — Ambulatory Visit (HOSPITAL_COMMUNITY)
Admission: RE | Admit: 2020-02-05 | Discharge: 2020-02-05 | Disposition: A | Discharge status: Home / Self Care | Payer: Medicare Other | Source: Ambulatory Visit | Attending: Cardiology | Admitting: Cardiology

## 2020-02-05 ENCOUNTER — Other Ambulatory Visit: Payer: Self-pay

## 2020-02-05 ENCOUNTER — Ambulatory Visit: Payer: Medicare Other | Attending: Physician Assistant | Admitting: Physician Assistant

## 2020-02-05 DIAGNOSIS — I5022 Chronic systolic (congestive) heart failure: Secondary | ICD-10-CM | POA: Diagnosis not present

## 2020-02-05 DIAGNOSIS — E1169 Type 2 diabetes mellitus with other specified complication: Secondary | ICD-10-CM | POA: Diagnosis not present

## 2020-02-05 DIAGNOSIS — Z95811 Presence of heart assist device: Secondary | ICD-10-CM

## 2020-02-05 DIAGNOSIS — N183 Chronic kidney disease, stage 3 unspecified: Secondary | ICD-10-CM | POA: Diagnosis not present

## 2020-02-05 DIAGNOSIS — Z7901 Long term (current) use of anticoagulants: Secondary | ICD-10-CM | POA: Diagnosis not present

## 2020-02-05 LAB — PROTIME-INR
INR: 1.6 — ABNORMAL HIGH (ref 0.8–1.2)
Prothrombin Time: 18.2 seconds — ABNORMAL HIGH (ref 11.4–15.2)

## 2020-02-05 MED ORDER — GLIPIZIDE 5 MG PO TABS: ORAL_TABLET | ORAL | 11 refills | Status: DC

## 2020-02-05 MED ORDER — WARFARIN SODIUM 6 MG PO TABS: ORAL_TABLET | ORAL | 5 refills | Status: DC

## 2020-02-05 MED ORDER — AMLODIPINE BESYLATE 5 MG PO TABS: 10.0000 mg | ORAL_TABLET | Freq: Every day | ORAL | 3 refills | Status: DC

## 2020-02-05 NOTE — Progress Notes (Signed)
LVAD INR 

## 2020-02-05 NOTE — Progress Notes (Signed)
Virtual Visit via Telephone Note  I connected with Benjamin Sherman on 02/05/20 at 11:10 AM EST by telephone and verified that I am speaking with the correct person using two identifiers.  Location: Patient: home Provider: [facility] office   I discussed the limitations, risks, security and privacy concerns of performing an evaluation and management service by telephone and the availability of in person appointments. I also discussed with the patient that there may be a patient responsible charge related to this service. The patient expressed understanding and agreed to proceed.   History of Present Illness:  Patient was supposed to return to our office to be assigned a PCP after his visit here in 02/2019.  He has LVAD  In place and is seen frequently by cardiology and coumadin clinic.  His blood sugars have been running 122-196 on labs in epic for the last 10 months.  He denies any episodes of hypoglycemia.  Last A1C=6.8 about 1 year ago.  Patient was unable to come into the office today due to overlapping cardiology appt.  No concerns/complaints    Observations/Objective:  NAD.  A&Ox3   Assessment and Plan: 1. On warfarin therapy Continue with coumadin clinic - amLODipine (NORVASC) 5 MG tablet; Take 2 tablets (10 mg total) by mouth daily.  Dispense: 180 tablet; Refill: 3  2. Chronic systolic CHF (congestive heart failure) (HCC) - amLODipine (NORVASC) 5 MG tablet; Take 2 tablets (10 mg total) by mouth daily.  Dispense: 180 tablet; Refill: 3  3. CKD (chronic kidney disease), stage III (HCC) - amLODipine (NORVASC) 5 MG tablet; Take 2 tablets (10 mg total) by mouth daily.  Dispense: 180 tablet; Refill: 3  4. Type 2 diabetes mellitus with other specified complication, without long-term current use of insulin (HCC) Likely not at gaol-based on mostly elevated readings in epic over the last year will increase from 1 tab bid to 2 tabs with breakfast and 1 tab with dinner - glipiZIDE (GLUCOTROL) 5 MG  tablet; Take 2 with breakfast and 1 with dinner  Dispense: 90 tablet; Refill: 11 Patient verbalizes understanding to take with food and understanding of hypoglycemic plan   Follow Up Instructions: Assign PCP in 6 weeks and check A1C   I discussed the assessment and treatment plan with the patient. The patient was provided an opportunity to ask questions and all were answered. The patient agreed with the plan and demonstrated an understanding of the instructions.   The patient was advised to call back or seek an in-person evaluation if the symptoms worsen or if the condition fails to improve as anticipated.  I provided 14 minutes of non-face-to-face time during this encounter.   Benjamin Co, PA-C  Patient ID: Benjamin Sherman, male   DOB: December 13, 1970, 50 y.o.   MRN: [id number]

## 2020-02-05 NOTE — Progress Notes (Signed)
Patient presents to clinic today for drive line exit wound care. Existing VAD dressing removed and site care performed using sterile technique. Drive line exit site cleaned with Chlora prep applicators x 2, allowed to dry, and Sorbaview with bio-patch re-applied. Exit site incorporated; No redness, tenderness, drainage, or foul odor noted. driveline anchor replace. Advised him of importance of weekly dressing changes and keeping DL secure.  Pt has adequate dressing supplies at home.   Return in 1 week dressing change per protocol.   [name] RN VAD Coordinator  Office: [phone]  24/7 Pager: [phone]

## 2020-02-06 ENCOUNTER — Other Ambulatory Visit (HOSPITAL_COMMUNITY): Payer: Self-pay | Admitting: *Deleted

## 2020-02-06 DIAGNOSIS — Z95811 Presence of heart assist device: Secondary | ICD-10-CM

## 2020-02-06 DIAGNOSIS — Z7901 Long term (current) use of anticoagulants: Secondary | ICD-10-CM

## 2020-02-11 ENCOUNTER — Telehealth (HOSPITAL_COMMUNITY): Payer: Self-pay | Admitting: Pharmacy Technician

## 2020-02-11 NOTE — Telephone Encounter (Signed)
Advanced Heart Failure Patient Advocate Encounter   Patient was approved to receive Entresto from [employer]  Patient ID: [id number] Effective dates: 01/27/20 through 01/15/21  Archer Asa, CPhT

## 2020-02-12 ENCOUNTER — Other Ambulatory Visit (HOSPITAL_COMMUNITY): Payer: Medicare Other

## 2020-02-18 ENCOUNTER — Other Ambulatory Visit (HOSPITAL_COMMUNITY): Payer: Self-pay | Admitting: *Deleted

## 2020-02-18 ENCOUNTER — Other Ambulatory Visit (HOSPITAL_COMMUNITY): Payer: Medicare Other

## 2020-02-18 DIAGNOSIS — Z7901 Long term (current) use of anticoagulants: Secondary | ICD-10-CM

## 2020-02-18 DIAGNOSIS — Z95811 Presence of heart assist device: Secondary | ICD-10-CM

## 2020-02-19 ENCOUNTER — Other Ambulatory Visit: Payer: Self-pay

## 2020-02-19 ENCOUNTER — Ambulatory Visit (HOSPITAL_COMMUNITY): Payer: Self-pay | Admitting: Pharmacist

## 2020-02-19 ENCOUNTER — Ambulatory Visit (HOSPITAL_COMMUNITY)
Admission: RE | Admit: 2020-02-19 | Discharge: 2020-02-19 | Disposition: A | Discharge status: Home / Self Care | Payer: Medicare Other | Source: Ambulatory Visit | Attending: Cardiology | Admitting: Cardiology

## 2020-02-19 DIAGNOSIS — Z95811 Presence of heart assist device: Secondary | ICD-10-CM

## 2020-02-19 DIAGNOSIS — Z7901 Long term (current) use of anticoagulants: Secondary | ICD-10-CM | POA: Diagnosis not present

## 2020-02-19 LAB — PROTIME-INR
INR: 1.9 — ABNORMAL HIGH (ref 0.8–1.2)
Prothrombin Time: 20.7 seconds — ABNORMAL HIGH (ref 11.4–15.2)

## 2020-02-19 NOTE — Addendum Note (Signed)
Encounter addended by: Lebron Quam, RN on: 02/19/2020 10:24 AM  Actions taken: Clinical Note Signed

## 2020-02-19 NOTE — Progress Notes (Signed)
Patient presents to clinic today for drive line exit wound care. Existing VAD dressing removed and site care performed using sterile technique. Drive line exit site cleaned with Chlora prep applicators x 2, allowed to dry, and Sorbaview with bio-patch re-applied. Exit site incorporated; No redness, tenderness, drainage, or foul odor noted. driveline anchor replace. Advised him of importance of weekly dressing changes and keeping DL secure.  Pt has adequate dressing supplies at home.   Return in 1 week dressing change per protocol.   Jerrico Covello RN VAD Coordinator  Office: [phone]  24/7 Pager: [phone]    

## 2020-02-19 NOTE — Progress Notes (Signed)
LVAD INR 

## 2020-02-20 ENCOUNTER — Other Ambulatory Visit (HOSPITAL_COMMUNITY): Payer: Self-pay | Admitting: *Deleted

## 2020-02-20 DIAGNOSIS — Z95811 Presence of heart assist device: Secondary | ICD-10-CM

## 2020-02-20 DIAGNOSIS — Z7901 Long term (current) use of anticoagulants: Secondary | ICD-10-CM

## 2020-02-25 ENCOUNTER — Telehealth (HOSPITAL_COMMUNITY): Payer: Self-pay

## 2020-02-25 ENCOUNTER — Other Ambulatory Visit (HOSPITAL_COMMUNITY): Payer: Medicare Other

## 2020-02-25 NOTE — Telephone Encounter (Deleted)
Advanced Heart Failure Patient Advocate Encounter   Patient was approved to receive Entresto from [employer]  Patient ID: [id number] Effective dates: 01/27/2020 through 02/06/2021  Attempted to contact patient, voicemail box not set up yet.  Virgia Land, Student-PharmD

## 2020-03-01 NOTE — Telephone Encounter (Signed)
Encounter opened in error

## 2020-03-01 NOTE — Telephone Encounter (Signed)
Opened in error

## 2020-03-03 ENCOUNTER — Other Ambulatory Visit: Payer: Self-pay

## 2020-03-03 ENCOUNTER — Ambulatory Visit (HOSPITAL_COMMUNITY): Payer: Self-pay | Admitting: Pharmacist

## 2020-03-03 ENCOUNTER — Ambulatory Visit (HOSPITAL_COMMUNITY)
Admission: RE | Admit: 2020-03-03 | Discharge: 2020-03-03 | Disposition: A | Discharge status: Home / Self Care | Payer: Medicare Other | Source: Ambulatory Visit | Attending: Internal Medicine | Admitting: Internal Medicine

## 2020-03-03 DIAGNOSIS — Z95811 Presence of heart assist device: Secondary | ICD-10-CM

## 2020-03-03 DIAGNOSIS — Z7901 Long term (current) use of anticoagulants: Secondary | ICD-10-CM | POA: Insufficient documentation

## 2020-03-03 LAB — PROTIME-INR
INR: 2.2 — ABNORMAL HIGH (ref 0.8–1.2)
Prothrombin Time: 23.5 seconds — ABNORMAL HIGH (ref 11.4–15.2)

## 2020-03-03 NOTE — Progress Notes (Signed)
LVAD INR 

## 2020-03-03 NOTE — Progress Notes (Signed)
Patient presents to clinic today for drive line exit wound care. Existing VAD dressing removed and site care performed using sterile technique. Drive line exit site cleaned with Chlora prep applicators x 2, allowed to dry, and Sorbaview with bio-patch re-applied. Exit site incorporated; No redness, tenderness, drainage, or foul odor noted. driveline anchor replace. Advised him of importance of weekly dressing changes and keeping DL secure.  Pt supplied with 4 dressing kits for home use. Asked him to bring one to each clinic visit for dressing change.    Return in 1 week dressing change per protocol.   [name], RN VAD Coordinator  Office: [phone]  24/7 Pager: [phone]

## 2020-03-05 ENCOUNTER — Other Ambulatory Visit (HOSPITAL_COMMUNITY): Payer: Self-pay | Admitting: Unknown Physician Specialty

## 2020-03-05 DIAGNOSIS — Z95811 Presence of heart assist device: Secondary | ICD-10-CM

## 2020-03-05 DIAGNOSIS — Z7901 Long term (current) use of anticoagulants: Secondary | ICD-10-CM

## 2020-03-10 ENCOUNTER — Other Ambulatory Visit (HOSPITAL_COMMUNITY): Payer: Medicare Other

## 2020-03-24 ENCOUNTER — Other Ambulatory Visit (HOSPITAL_COMMUNITY): Payer: Self-pay | Admitting: *Deleted

## 2020-03-24 DIAGNOSIS — Z95811 Presence of heart assist device: Secondary | ICD-10-CM

## 2020-03-24 DIAGNOSIS — Z7901 Long term (current) use of anticoagulants: Secondary | ICD-10-CM

## 2020-03-25 ENCOUNTER — Ambulatory Visit (HOSPITAL_COMMUNITY)
Admission: RE | Admit: 2020-03-25 | Discharge: 2020-03-25 | Disposition: A | Discharge status: Home / Self Care | Payer: Medicare Other | Source: Ambulatory Visit | Attending: Internal Medicine | Admitting: Internal Medicine

## 2020-03-25 ENCOUNTER — Ambulatory Visit (HOSPITAL_COMMUNITY): Payer: Self-pay | Admitting: Pharmacist

## 2020-03-25 ENCOUNTER — Encounter (HOSPITAL_COMMUNITY): Payer: Self-pay

## 2020-03-25 ENCOUNTER — Other Ambulatory Visit: Payer: Self-pay

## 2020-03-25 VITALS — BP 103/71 | HR 82 | Temp 98.0°F | Ht 72.0 in | Wt 264.2 lb

## 2020-03-25 DIAGNOSIS — Z7984 Long term (current) use of oral hypoglycemic drugs: Secondary | ICD-10-CM | POA: Diagnosis not present

## 2020-03-25 DIAGNOSIS — N1831 Chronic kidney disease, stage 3a: Secondary | ICD-10-CM

## 2020-03-25 DIAGNOSIS — Z8249 Family history of ischemic heart disease and other diseases of the circulatory system: Secondary | ICD-10-CM | POA: Diagnosis not present

## 2020-03-25 DIAGNOSIS — I483 Typical atrial flutter: Secondary | ICD-10-CM | POA: Insufficient documentation

## 2020-03-25 DIAGNOSIS — I5042 Chronic combined systolic (congestive) and diastolic (congestive) heart failure: Secondary | ICD-10-CM | POA: Diagnosis not present

## 2020-03-25 DIAGNOSIS — Z79899 Other long term (current) drug therapy: Secondary | ICD-10-CM | POA: Insufficient documentation

## 2020-03-25 DIAGNOSIS — Z91030 Bee allergy status: Secondary | ICD-10-CM | POA: Diagnosis not present

## 2020-03-25 DIAGNOSIS — E119 Type 2 diabetes mellitus without complications: Secondary | ICD-10-CM | POA: Diagnosis not present

## 2020-03-25 DIAGNOSIS — Z95811 Presence of heart assist device: Secondary | ICD-10-CM

## 2020-03-25 DIAGNOSIS — I5022 Chronic systolic (congestive) heart failure: Secondary | ICD-10-CM

## 2020-03-25 DIAGNOSIS — Z87891 Personal history of nicotine dependence: Secondary | ICD-10-CM | POA: Insufficient documentation

## 2020-03-25 DIAGNOSIS — Z7982 Long term (current) use of aspirin: Secondary | ICD-10-CM | POA: Diagnosis not present

## 2020-03-25 DIAGNOSIS — E039 Hypothyroidism, unspecified: Secondary | ICD-10-CM | POA: Diagnosis not present

## 2020-03-25 DIAGNOSIS — I428 Other cardiomyopathies: Secondary | ICD-10-CM | POA: Diagnosis not present

## 2020-03-25 DIAGNOSIS — I10 Essential (primary) hypertension: Secondary | ICD-10-CM

## 2020-03-25 DIAGNOSIS — I130 Hypertensive heart and chronic kidney disease with heart failure and stage 1 through stage 4 chronic kidney disease, or unspecified chronic kidney disease: Secondary | ICD-10-CM | POA: Insufficient documentation

## 2020-03-25 DIAGNOSIS — Z7901 Long term (current) use of anticoagulants: Secondary | ICD-10-CM

## 2020-03-25 LAB — BASIC METABOLIC PANEL
Anion gap: 5 (ref 5–15)
BUN: 21 mg/dL — ABNORMAL HIGH (ref 6–20)
CO2: 27 mmol/L (ref 22–32)
Calcium: 8.9 mg/dL (ref 8.9–10.3)
Chloride: 103 mmol/L (ref 98–111)
Creatinine, Ser: 1.45 mg/dL — ABNORMAL HIGH (ref 0.61–1.24)
GFR, Estimated: 58 mL/min — ABNORMAL LOW (ref >60)
Glucose, Bld: 297 mg/dL — ABNORMAL HIGH (ref 70–99)
Potassium: 5.3 mmol/L — ABNORMAL HIGH (ref 3.5–5.1)
Sodium: 135 mmol/L (ref 135–145)

## 2020-03-25 LAB — CBC
HCT: 44.6 % (ref 39.0–52.0)
Hemoglobin: 14.4 g/dL (ref 13.0–17.0)
MCH: 28.6 pg (ref 26.0–34.0)
MCHC: 32.3 g/dL (ref 30.0–36.0)
MCV: 88.5 fL (ref 80.0–100.0)
Platelets: 212 10*3/uL (ref 150–400)
RBC: 5.04 MIL/uL (ref 4.22–5.81)
RDW: 15.5 % (ref 11.5–15.5)
WBC: 6.8 10*3/uL (ref 4.0–10.5)
nRBC: 0 % (ref 0.0–0.2)

## 2020-03-25 LAB — LACTATE DEHYDROGENASE: LDH: 366 U/L — ABNORMAL HIGH (ref 98–192)

## 2020-03-25 LAB — PROTIME-INR
INR: 1.8 — ABNORMAL HIGH (ref 0.8–1.2)
Prothrombin Time: 20.5 seconds — ABNORMAL HIGH (ref 11.4–15.2)

## 2020-03-25 NOTE — Progress Notes (Signed)
LVAD INR 

## 2020-03-25 NOTE — Progress Notes (Signed)
  Heart and Vascular Care Navigation  03/25/2020  Benjamin Sherman 17-Jun-1970 [id number]  Reason for Referral: CSW met with patient in the VAD Clinic                                                                                                    Assessment:     Patient shared that his mother recently passed away. Patient was very close with his mom and was her sole provider.                                 HRT/VAS Care Coordination    Patients Home Cardiology Office Heart Failure Clinic   Outpatient Care Team Social Worker   Social Worker Name: Raquel Sarna, [place] [phone]   Living arrangements for the past 2 months Apartment   Lives with: Roommate   Patient Current Insurance Coverage Traditional Medicare   Patient Has Concern With Paying Medical Bills No   Does Patient Have Prescription Coverage? Yes   Home Assistive Devices/Equipment None   DME [facility] History:                                                                             SDOH Screenings   Alcohol Screen: Not on file  Depression (VQM0-8): Not on file  Financial Resource Strain: Low Risk   . Difficulty of Paying Living Expenses: Not very hard  Food Insecurity: No Food Insecurity  . Worried About Charity fundraiser in the Last Year: Never true  . Ran Out of Food in the Last Year: Never true  Housing: Low Risk   . Last Housing Risk Score: 0  Physical Activity: Not on file  Social Connections: Not on file  Stress: Not on file  Tobacco Use: Medium Risk  . Smoking Tobacco Use: Former Smoker  . Smokeless Tobacco Use: Never Used  Transportation Needs: No Transportation Needs  . Lack of Transportation (Medical): No  . Lack of Transportation (Non-Medical): No    SDOH Interventions: Financial Resources:  Sales promotion account executive Interventions: Intervention Not Indicated n/a  Food Insecurity:  Food Insecurity Interventions: Intervention Not Indicated  Housing Insecurity:  Housing  Interventions: Intervention Not Indicated  Transportation:   Transportation Interventions: Intervention Not Indicated   Follow-up plan: CSW provided supportive intervention and discussed VAD Support Group as well as the HF Men's Support Group to provide support and companionship to patient. Patient was open to attending and will follow up on support group meeting. CSW will continue to be available as needed. Raquel Sarna, [place], [place]

## 2020-03-25 NOTE — Progress Notes (Signed)
Patient presents for 2 month follow up in VAD Clinic today alone. Reports no problems with VAD equipment or concerns with drive line.   Pt says he has no limitations to daily activities. He has started doing light weight lifting at the gym once weekly. He is walking at home for 30 - 45 minutes daily. He continues to plan and enjoy golf. He does ride the cart, but has no physical limitations during play.   Pt does report he lost his mother, age 52, 2 weeks ago and misses her "very much". It was not unexpected, she was residing in nursing home, but he reports they were very close. Lasandra Beech, LCSW in room to speak with Benjamin Sherman. Informed of upcoming VAD Support and HF Men's Group meetings. Details provided, pt is planning on attending.   Vital Signs:   Temp: 98.0      Doppler Pressure: 88 Automatc BP: 103/71 (86)   HR: 82     SPO2: 99% on RA       Weight: 264.2 lb w/ eqt  Last weight: 262.2 lb  VAD Indication: Destination Therapy - due to limited social support. Pt discussed with Dr. Edwena Blow at [facility] prior to LVAD implant.  LVAD assessment:      Speed: 5300 RPM      Flow: 4.2       Power: 4.0 w       PI: 5.9     Alarms: none  Events: 60+ daily  Hct: 20       Fixed speed: 5300 Low speed limit: 5000  Primary Controller: Replace back up battery in 22 months.  Back up controller: Replace back up battery in 26 months.  I reviewed the LVAD parameters from today and compared the results to the patient's prior recorded data. LVAD interrogation was STABLE for significant power changes, NEGATIVE for clinical alarms and STABLE for PI events/speed drops. No programming changes were made and pump is functioning within specified parameters. Pt is performing daily controller and system monitor self tests along with completing weekly and monthly maintenance for LVAD equipment.  LVAD equipment check completed and is in good working order. Back-up equipment present.   Annual maintenance  completed on patient's home equipment per [employer] today, 05/26/19.  Exit Site Care: Gene is doing weekly dressing change weekly. Last dressing change was 2 weeks ago. Existing VAD dressing removed and site care performed using sterile technique. Drive line exit site cleaned with Chlora prep applicators x 2, allowed to dry, and biopatch w/ Sorbaview dressing applied. Exit site healed and partially incorporated, the velour is fully implanted at exit site. No drainage, redness, tenderness, foul odor or rash noted. Drive line anchor re-applied. Pt denies fever or chills. Pt provided with box of large tegaderms for home use when he showers.   Device:  N/A  BP & Labs:  Doppler 88 - is reflecting MAP  Hgb 14.4 - No S/S of bleeding. Specifically denies melena/BRBPR or nosebleeds.  LDH pending - established baseline of 200 - 400. Denies tea-colored urine. No power elevations noted on interrogation other than above noted incidents.   Patient Instructions:  1. No change in medications. 2. PharmD will call you with INR results and warfarin dosing instructions. 3. Return in one week for dressing change. 4. Return in 2 months for full visit; bring your home VAD equipment for annual maintenance. Be prepared to perform 6 minute walk.   [name] RN VAD Coordinator  Office: [phone]  24/7 Pager: [phone]

## 2020-03-28 NOTE — Progress Notes (Signed)
LVAD Clinic Note   Primary Cardiologist: Dr. Gala Romney   HPI: Benjamin Sherman is a 50 y.o. 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 ECHO showed severely reduced EF. 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 Placed on steroids without benefit.  Admitted 11/18 after fall/syncopal episode resulting in bifrontal SAH. Neurosurgery consulted.   Admitted 4/19 with cardiogenic shock in setting of AFL. Supported with inotropes and underwent DC-CV but had persistent shock. After numerous discussions about concern for adequate social support underwent placement of HM-3 LVAD on 05/15/17. D/c home 06/04/17  Here for regular f/u. He is doing great. Playing golf and going to the gym regularly. Recently hit 302 yard golf shot. Sad over the recent death of his 85 yo mother. Denies orthopnea or PND. No fevers, chills or problems with driveline. No bleeding, melena or neuro symptoms. No VAD alarms. Taking all meds as prescribed.    VAD Indication: Destination Therapy - due to limited social support. Pt discussed with Dr. Edwena Blow at [facility] prior to LVAD implant.  LVAD assessment:                                                    Speed: 5300 RPM                                                       Flow: 4.2                                                                      Power: 4.0 w                            PI: 5.9                                       Alarms: none  Events: 60+ daily  Hct: 20                                                              Fixed speed: 5300 Low speed limit: 5000  Primary Controller: Replace back up battery in22 months.  Back up controller: Replace back up battery in 26 months.]  Past Medical History:  Diagnosis Date  . CHF (congestive heart failure) (HCC)   . Diabetes mellitus without  complication (HCC)     Current Outpatient Medications  Medication Sig Dispense Refill  . amLODipine (NORVASC) 5 MG tablet Take 2 tablets (10 mg total) by  mouth daily. 180 tablet 3  . aspirin 81 MG EC tablet Take 1 tablet (81 mg total) by mouth daily. 90 tablet 3  . Blood Glucose Monitoring Suppl (TRUE METRIX METER) DEVI 1 each by Does not apply route daily with breakfast. 1 Device 0  . ferrous sulfate 325 (65 FE) MG EC tablet Take 1 tablet (325 mg total) by mouth 2 (two) times daily. 180 tablet 1  . glipiZIDE (GLUCOTROL) 5 MG tablet Take 2 with breakfast and 1 with dinner 90 tablet 11  . glucose blood (TRUE METRIX BLOOD GLUCOSE TEST) test strip Use daily before breakfast 30 each 12  . pantoprazole (PROTONIX) 40 MG tablet Take 1 tablet (40 mg total) by mouth daily. 30 tablet 11  . sacubitril-valsartan (ENTRESTO) 49-51 MG Take 1 tablet by mouth 2 (two) times daily. 60 tablet 4  . TRUEPLUS LANCETS 28G MISC 1 each by Does not apply route daily. 30 each 12  . warfarin (COUMADIN) 6 MG tablet TAKE 1/2 tablet (3 mg) Tuesday/Thursday/Satur[place] and 1 tablet ( 6mg ) all other days; or as directed by HF clinic 86 tablet 5  . enoxaparin (LOVENOX) 40 MG/0.4ML injection Inject 0.4 mLs (40 mg total) into the skin every 12 (twelve) hours. (Patient not taking: No sig reported) 4 mL 0   No current facility-administered medications for this encounter.   Allergies  Allergen Reactions  . Bee Venom     UNSPECIFIED REACTION    Social History   Socioeconomic History  . Marital status: Single    Spouse name: Not on file  . Number of children: Not on file  . Years of education: Not on file  . Highest education level: Not on file  Occupational History  . Occupation: security  Tobacco Use  . Smoking status: Former Smoker    Types: Cigars    Quit date: 11/16/2016    Years since quitting: 3.3  . Smokeless tobacco: Never Used  . Tobacco comment: 6 cigars per wk  Vaping Use  . Vaping Use: Never used  Substance  and Sexual Activity  . Alcohol use: Yes    Alcohol/week: 4.0 standard drinks    Types: 2 Shots of liquor, 2 Cans of beer per week    Comment: ocasional  . Drug use: No  . Sexual activity: Not Currently  Other Topics Concern  . Not on file  Social History Narrative  . Not on file   Social Determinants of Health   Financial Resource Strain: Low Risk   . Difficulty of Paying Living Expenses: Not very hard  Food Insecurity: No Food Insecurity  . Worried About 13/01/2016 in the Last Year: Never true  . Ran Out of Food in the Last Year: Never true  Transportation Needs: No Transportation Needs  . Lack of Transportation (Medical): No  . Lack of Transportation (Non-Medical): No  Physical Activity: Not on file  Stress: Not on file  Social Connections: Not on file  Intimate Partner Violence: Not on file    Family History  Problem Relation Age of Onset  . CAD Father    Vitals:   03/25/20 1145 03/25/20 1146  BP: (!) 88/0 103/71  Pulse:  82  Temp:  98 F (36.7 C)  SpO2:  99%  Weight:  119.8 kg (264 lb 3.2 oz)  Height:  6' (1.829 m)    Wt Readings from Last 3 Encounters:  03/25/20 119.8 kg (264 lb 3.2 oz)  01/26/20 118.9 kg (262 lb 3.2 oz)  11/24/19 120.1 kg (264 lb 12.8 oz)      Vital Signs:     Temp: 98.0                                                       Doppler Pressure:88 Automatc BP: 103/71 (86)                   HR: 82                                      SPO2: 99% on RA                                                        Weight: 264.2 lb w/ eqt  Last weight: 262.2 lb   Physical Exam: General:  NAD.  HEENT: normal  Neck: supple. JVP not elevated.  Carotids 2+ bilat; no bruits. No lymphadenopathy or thryomegaly appreciated. Cor: LVAD hum.  Lungs: Clear. Abdomen: obese soft, nontender, non-distended. No hepatosplenomegaly. No bruits or masses. Good bowel sounds. Driveline site clean. Anchor in place.  Extremities: no cyanosis, clubbing,  rash. Warm no edema  Neuro: alert & oriented x 3. No focal deficits. Moves all 4 without problem     ASSESSMENT & PLAN:  1. Chronic Combined Systolic/Diastolic Heart Failure. NICM  - ECHO 4/19 EF 20% CMRI EF 22%  - s/p HM-3 LVAD on 05/15/17 - Now almost 3 years out.  - NYHA I. Doing very well with VAD support - Volume status looks good. Off lasix. Encouraged to keep up with his fluid intake  2. VAD management.  - VAD interrogated personally. Parameters stable.. - Has frequent PI events at times but no low flows. Reminded him to keep up with fluid intake.  No change - LDH 366 - Remains on ASA 81 mg daily + coumadin. - INR 1.8  INR goal 2.0-2.5 Discussed dosing with PharmD personally. 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 - Continue coumadin - no change  4. HTN - MAPs look good - Continue Entresto at 49/51. Can increase to 97/103 if BP remains elevated - Encouraged him to keep his weight down   5. Hypothyroidism - improved off amio - TFTs ok.    6. DMII - Per PCP - Doubt he will tolerate SGLT2i with volume issues.   7. CKD 3a - Creatinine baseline 1.5-1.8 - Stable at 1.45 today  Total time spent 35 minutes. Over half that time spent discussing above.   [name], MD 03/28/20

## 2020-03-31 ENCOUNTER — Other Ambulatory Visit (HOSPITAL_COMMUNITY): Payer: Medicare Other

## 2020-04-05 ENCOUNTER — Ambulatory Visit (HOSPITAL_COMMUNITY)
Admission: RE | Admit: 2020-04-05 | Discharge: 2020-04-05 | Disposition: A | Discharge status: Home / Self Care | Payer: Medicare Other | Source: Ambulatory Visit | Attending: Internal Medicine | Admitting: Internal Medicine

## 2020-04-05 ENCOUNTER — Ambulatory Visit (HOSPITAL_COMMUNITY): Payer: Self-pay | Admitting: Pharmacist

## 2020-04-05 ENCOUNTER — Other Ambulatory Visit: Payer: Self-pay

## 2020-04-05 DIAGNOSIS — Z4801 Encounter for change or removal of surgical wound dressing: Secondary | ICD-10-CM | POA: Diagnosis present

## 2020-04-05 DIAGNOSIS — Z95811 Presence of heart assist device: Secondary | ICD-10-CM

## 2020-04-05 DIAGNOSIS — Z7901 Long term (current) use of anticoagulants: Secondary | ICD-10-CM | POA: Diagnosis not present

## 2020-04-05 LAB — PROTIME-INR
INR: 1.9 — ABNORMAL HIGH (ref 0.8–1.2)
Prothrombin Time: 21.1 seconds — ABNORMAL HIGH (ref 11.4–15.2)

## 2020-04-05 NOTE — Progress Notes (Signed)
Patient presents to clinic today for drive line exit wound care. Existing VAD dressing removed and site care performed using sterile technique. Drive line exit site cleaned with Chlora prep applicators x 2, rinsed with saline, allowed to dry, and Sorbaview with bio-patch re-applied. Exit site incorporated; pt did have a small abrasion/redness underneath his anchor-therefore, I rotated the sorbaview slightly clockwise so that the red area is OTA-see pic below. No redness, tenderness, drainage, or foul odor noted. Driveline anchor replace. Advised him of importance of weekly dressing changes and keeping DL secure.  Pt has sufficent dressing kits at home. Asked him to bring one to each clinic visit for dressing change.      Return in 1 week dressing change per protocol.   [name], RN VAD Coordinator  Office: [phone]  24/7 Pager: [phone]

## 2020-04-05 NOTE — Progress Notes (Signed)
LVAD INR 

## 2020-04-12 ENCOUNTER — Other Ambulatory Visit (HOSPITAL_COMMUNITY): Payer: Medicare Other

## 2020-05-03 ENCOUNTER — Other Ambulatory Visit (HOSPITAL_COMMUNITY): Payer: Self-pay | Admitting: Unknown Physician Specialty

## 2020-05-03 ENCOUNTER — Other Ambulatory Visit: Payer: Self-pay | Admitting: Unknown Physician Specialty

## 2020-05-03 DIAGNOSIS — Z95811 Presence of heart assist device: Secondary | ICD-10-CM

## 2020-05-03 DIAGNOSIS — Z7901 Long term (current) use of anticoagulants: Secondary | ICD-10-CM

## 2020-05-05 ENCOUNTER — Other Ambulatory Visit: Payer: Self-pay

## 2020-05-05 ENCOUNTER — Ambulatory Visit (HOSPITAL_COMMUNITY): Payer: Self-pay | Admitting: Pharmacist

## 2020-05-05 ENCOUNTER — Ambulatory Visit (HOSPITAL_COMMUNITY)
Admission: RE | Admit: 2020-05-05 | Discharge: 2020-05-05 | Disposition: A | Discharge status: Home / Self Care | Payer: Medicare Other | Source: Ambulatory Visit | Attending: Internal Medicine | Admitting: Internal Medicine

## 2020-05-05 DIAGNOSIS — Z7901 Long term (current) use of anticoagulants: Secondary | ICD-10-CM | POA: Insufficient documentation

## 2020-05-05 DIAGNOSIS — Z4801 Encounter for change or removal of surgical wound dressing: Secondary | ICD-10-CM | POA: Diagnosis present

## 2020-05-05 DIAGNOSIS — Z95811 Presence of heart assist device: Secondary | ICD-10-CM | POA: Insufficient documentation

## 2020-05-05 LAB — PROTIME-INR
INR: 2.1 — ABNORMAL HIGH (ref 0.8–1.2)
Prothrombin Time: 23.9 seconds — ABNORMAL HIGH (ref 11.4–15.2)

## 2020-05-05 NOTE — Progress Notes (Signed)
Patient presents to clinic today for drive line exit wound care. Existing VAD dressing removed and site care performed using sterile technique. Drive line exit site cleaned with Chlora prep applicators x 2, rinsed with saline, allowed to dry, and Sorbaview with bio-patch re-applied. Exit site incorporated and velour completely buried. No redness, tenderness, drainage, or foul odor noted. Driveline anchor replace. Advised him of importance of weekly dressing changes and keeping DL secure.  Pt has sufficent dressing kits at home. Asked him to bring one to each clinic visit for dressing change.    Pt states that he is playing golf 4-5x a week.  Return in 1 week dressing change per protocol.   [name], RN VAD Coordinator  Office: [phone]  24/7 Pager: [phone]

## 2020-05-05 NOTE — Progress Notes (Signed)
LVAD INR 

## 2020-05-12 ENCOUNTER — Other Ambulatory Visit (HOSPITAL_COMMUNITY): Payer: Medicare Other

## 2020-05-17 ENCOUNTER — Ambulatory Visit (HOSPITAL_COMMUNITY): Payer: Self-pay | Admitting: Pharmacist

## 2020-05-17 ENCOUNTER — Ambulatory Visit (HOSPITAL_COMMUNITY)
Admission: RE | Admit: 2020-05-17 | Discharge: 2020-05-17 | Disposition: A | Discharge status: Home / Self Care | Payer: Medicare Other | Source: Ambulatory Visit | Attending: Internal Medicine | Admitting: Internal Medicine

## 2020-05-17 ENCOUNTER — Other Ambulatory Visit (HOSPITAL_COMMUNITY): Payer: Self-pay

## 2020-05-17 ENCOUNTER — Other Ambulatory Visit: Payer: Self-pay

## 2020-05-17 DIAGNOSIS — Z95811 Presence of heart assist device: Secondary | ICD-10-CM | POA: Insufficient documentation

## 2020-05-17 LAB — PROTIME-INR
INR: 1.3 — ABNORMAL HIGH (ref 0.8–1.2)
Prothrombin Time: 16.3 seconds — ABNORMAL HIGH (ref 11.4–15.2)

## 2020-05-17 NOTE — Progress Notes (Signed)
Patient presents to clinic today for drive line exit wound care. Existing VAD dressing removed and site care performed using sterile technique. Drive line exit site cleaned with Chlora prep applicators x 2, rinsed with saline, allowed to dry, and Sorbaview with bio-patch re-applied. Exit site incorporated and velour completely buried. No redness, tenderness, drainage, or foul odor noted. Driveline anchor replace. Advised him of importance of weekly dressing changes and keeping DL secure.  Pt given 4 weekly kits today. Asked him to bring one to each clinic visit for dressing change.    Pt states that he is playing golf 4-5x a week.  Return in 1 week dressing change per protocol.   [name], RN VAD Coordinator  Office: [phone]  24/7 Pager: [phone]

## 2020-05-17 NOTE — Progress Notes (Signed)
LVAD INR 

## 2020-05-25 ENCOUNTER — Other Ambulatory Visit (HOSPITAL_COMMUNITY): Payer: Self-pay | Admitting: *Deleted

## 2020-05-25 ENCOUNTER — Other Ambulatory Visit (HOSPITAL_COMMUNITY): Payer: Medicare Other

## 2020-05-25 DIAGNOSIS — Z7901 Long term (current) use of anticoagulants: Secondary | ICD-10-CM

## 2020-05-25 DIAGNOSIS — R6889 Other general symptoms and signs: Secondary | ICD-10-CM

## 2020-05-25 DIAGNOSIS — Z95811 Presence of heart assist device: Secondary | ICD-10-CM

## 2020-05-26 ENCOUNTER — Other Ambulatory Visit: Payer: Self-pay

## 2020-05-26 ENCOUNTER — Encounter (HOSPITAL_COMMUNITY): Payer: Self-pay

## 2020-05-26 ENCOUNTER — Ambulatory Visit (HOSPITAL_COMMUNITY): Payer: Self-pay

## 2020-05-26 ENCOUNTER — Ambulatory Visit (HOSPITAL_COMMUNITY)
Admission: RE | Admit: 2020-05-26 | Discharge: 2020-05-26 | Disposition: A | Discharge status: Home / Self Care | Payer: Medicare Other | Source: Ambulatory Visit | Attending: Internal Medicine | Admitting: Internal Medicine

## 2020-05-26 DIAGNOSIS — Z4801 Encounter for change or removal of surgical wound dressing: Secondary | ICD-10-CM | POA: Diagnosis present

## 2020-05-26 DIAGNOSIS — Z7901 Long term (current) use of anticoagulants: Secondary | ICD-10-CM | POA: Insufficient documentation

## 2020-05-26 DIAGNOSIS — Z95811 Presence of heart assist device: Secondary | ICD-10-CM | POA: Diagnosis not present

## 2020-05-26 DIAGNOSIS — R6889 Other general symptoms and signs: Secondary | ICD-10-CM | POA: Insufficient documentation

## 2020-05-26 LAB — COMPREHENSIVE METABOLIC PANEL
ALT: 23 U/L (ref 0–44)
AST: 28 U/L (ref 15–41)
Albumin: 3.5 g/dL (ref 3.5–5.0)
Alkaline Phosphatase: 35 U/L — ABNORMAL LOW (ref 38–126)
Anion gap: 8 (ref 5–15)
BUN: 21 mg/dL — ABNORMAL HIGH (ref 6–20)
CO2: 26 mmol/L (ref 22–32)
Calcium: 9.5 mg/dL (ref 8.9–10.3)
Chloride: 102 mmol/L (ref 98–111)
Creatinine, Ser: 1.58 mg/dL — ABNORMAL HIGH (ref 0.61–1.24)
GFR, Estimated: 53 mL/min — ABNORMAL LOW (ref >60)
Glucose, Bld: 187 mg/dL — ABNORMAL HIGH (ref 70–99)
Potassium: 4.5 mmol/L (ref 3.5–5.1)
Sodium: 136 mmol/L (ref 135–145)
Total Bilirubin: 0.8 mg/dL (ref 0.3–1.2)
Total Protein: 7.3 g/dL (ref 6.5–8.1)

## 2020-05-26 LAB — CBC
HCT: 46.3 % (ref 39.0–52.0)
Hemoglobin: 14.4 g/dL (ref 13.0–17.0)
MCH: 27.9 pg (ref 26.0–34.0)
MCHC: 31.1 g/dL (ref 30.0–36.0)
MCV: 89.7 fL (ref 80.0–100.0)
Platelets: 242 10*3/uL (ref 150–400)
RBC: 5.16 MIL/uL (ref 4.22–5.81)
RDW: 15.3 % (ref 11.5–15.5)
WBC: 7 10*3/uL (ref 4.0–10.5)
nRBC: 0 % (ref 0.0–0.2)

## 2020-05-26 LAB — FOLATE: Folate: 34.7 ng/mL (ref >5.9)

## 2020-05-26 LAB — IRON AND TIBC
Iron: 90 ug/dL (ref 45–182)
Saturation Ratios: 30 % (ref 17.9–39.5)
TIBC: 304 ug/dL (ref 250–450)
UIBC: 214 ug/dL

## 2020-05-26 LAB — VITAMIN B12: Vitamin B-12: 415 pg/mL (ref 180–914)

## 2020-05-26 LAB — PREALBUMIN: Prealbumin: 24.3 mg/dL (ref 18–38)

## 2020-05-26 LAB — PROTIME-INR
INR: 1.8 — ABNORMAL HIGH (ref 0.8–1.2)
Prothrombin Time: 20.6 seconds — ABNORMAL HIGH (ref 11.4–15.2)

## 2020-05-26 LAB — FERRITIN: Ferritin: 381 ng/mL — ABNORMAL HIGH (ref 24–336)

## 2020-05-26 LAB — LACTATE DEHYDROGENASE: LDH: 165 U/L (ref 98–192)

## 2020-05-26 NOTE — Progress Notes (Signed)
LVAD INR 

## 2020-05-26 NOTE — Progress Notes (Addendum)
Patient presents for 2 month follow up with 3 year Intermacs and VAD equipment annual maintenance in VAD Clinic today alone. Reports no problems with VAD equipment or concerns with drive line.   Pt says he has no limitations to daily activities. He continues to play golf, he has stopped his 30 min/day walks, but plans on re-starting these now that it is warm.   Vital Signs:   Temp: 98.3     Doppler Pressure: 84 Automatc BP: 113/68 (86)   HR: 77     SPO2: 96% on RA       Weight: 263.6 lb w/ eqt  Last weight: 264.2 lb  VAD Indication: Destination Therapy - due to limited social support. Pt discussed with Dr. Edwena Blow at [facility] prior to LVAD implant.  LVAD assessment:      Speed: 5300 RPM      Flow: 4.4       Power: 3.9 w       PI: 5.1    Alarms: none  Events: 60+ daily  Hct: 20       Fixed speed: 5300 Low speed limit: 5000  Primary Controller: Replace back up battery in 20 months.  Back up controller: Replace back up battery in 24 months.  I reviewed the LVAD parameters from today and compared the results to the patient's prior recorded data. LVAD interrogation was STABLE for significant power changes, NEGATIVE for clinical alarms and STABLE for PI events/speed drops. No programming changes were made and pump is functioning within specified parameters. Pt is performing daily controller and system monitor self tests along with completing weekly and monthly maintenance for LVAD equipment.  LVAD equipment check completed and is in good working order. Back-up equipment present.   Annual maintenance completed on patient's home equipment per [employer] today, 05/26/20.  Exit Site Care: Weekly dressing changes per VAD Coordinators in clinic. Existing VAD dressing removed and site care performed using sterile technique. Drive line exit site cleaned with Chlora prep applicators x 2, allowed to dry, and biopatch w/ Sorbaview dressing applied. Exit site incorporated, the velour is fully  implanted at exit site. No drainage, redness, tenderness, foul odor or rash noted. Drive line anchor re-applied. Pt denies fever or chills.   Device:  N/A  BP & Labs:  Doppler 84 - is reflecting MAP  Hgb pending - No S/S of bleeding. Specifically denies melena/BRBPR or nosebleeds.  LDH pending - established baseline of 200 - 400. Denies tea-colored urine. No power elevations noted on interrogation other than above noted incidents.   3 year Intermacs follow up completed including:  Quality of Life, KCCQ-12, and Neurocognitive trail making (2:45 minutes).   [facility] Cardiomyopathy Questionnaire  KCCQ-12 05/26/2020  1 a. Ability to shower/bathe Not at all limited  1 b. Ability to walk 1 block Not at all limited  1 c. Ability to hurry/jog Not at all limited  2. Edema feet/ankles/legs Never over the past 2 weeks  3. Limited by fatigue Never over the past 2 weeks  4. Limited by dyspnea Never over the past 2 weeks  5. Sitting up / on 3+ pillows Never over the past 2 weeks  6. Limited enjoyment of life Slightly limited  7. Rest of life w/ symptoms Mostly dissatisfied  8 a. Participation in hobbies Slightly limited  8 b. Participation in chores Did not limit at all  8 c. Visiting family/friends Did not limit at all     Pt completed 1450 feet during 6 minute  walk.  Back up controller: 11V backup battery was charged last week by the patient.   Batteries Manufacture Date: Number of uses: Re-calibration  04/10/17 227 Reviewed and demonstrated re-calibration steps   Annual maintenance completed per [employer] on patient's home universal Magazine features editor. Batteries replaced in home MPU.    Provided patient with 8 new batteries per manufacturer recommendation (replace every 36 months or 360 uses).  Patient Instructions:  1. No change in medications. 2. PharmD will call you with INR results and warfarin dosing instructions. 3. Return in one week for dressing change. 4. Return in 2 months  for full visit.  [name] RN VAD Coordinator  Office: [phone]  24/7 Pager: [phone]

## 2020-05-26 NOTE — Patient Instructions (Signed)
1. No change in medications. 2. PharmD will call you with INR results and warfarin dosing instructions. 3. Return in one week for dressing change. 4. Return in 2 months for full visit.

## 2020-05-28 ENCOUNTER — Other Ambulatory Visit (HOSPITAL_COMMUNITY): Payer: Self-pay | Admitting: Unknown Physician Specialty

## 2020-05-28 DIAGNOSIS — Z95811 Presence of heart assist device: Secondary | ICD-10-CM

## 2020-05-28 DIAGNOSIS — Z7901 Long term (current) use of anticoagulants: Secondary | ICD-10-CM

## 2020-06-02 ENCOUNTER — Ambulatory Visit (HOSPITAL_COMMUNITY)
Admission: RE | Admit: 2020-06-02 | Discharge: 2020-06-02 | Disposition: A | Discharge status: Home / Self Care | Payer: Medicare Other | Source: Ambulatory Visit | Attending: Internal Medicine | Admitting: Internal Medicine

## 2020-06-02 ENCOUNTER — Ambulatory Visit (HOSPITAL_COMMUNITY): Payer: Self-pay

## 2020-06-02 ENCOUNTER — Other Ambulatory Visit: Payer: Self-pay

## 2020-06-02 DIAGNOSIS — Z95811 Presence of heart assist device: Secondary | ICD-10-CM | POA: Diagnosis not present

## 2020-06-02 DIAGNOSIS — Z7901 Long term (current) use of anticoagulants: Secondary | ICD-10-CM | POA: Diagnosis not present

## 2020-06-02 DIAGNOSIS — Z4801 Encounter for change or removal of surgical wound dressing: Secondary | ICD-10-CM | POA: Insufficient documentation

## 2020-06-02 LAB — PROTIME-INR
INR: 2 — ABNORMAL HIGH (ref 0.8–1.2)
Prothrombin Time: 22.8 seconds — ABNORMAL HIGH (ref 11.4–15.2)

## 2020-06-02 MED ORDER — WARFARIN SODIUM 6 MG PO TABS
ORAL_TABLET | ORAL | 5 refills | Status: DC
Start: 2020-06-02 — End: 2020-08-02

## 2020-06-02 NOTE — Progress Notes (Signed)
LVAD INR 

## 2020-06-02 NOTE — Progress Notes (Signed)
Patient presents to clinic today for drive line exit wound care. Existing VAD dressing removed and site care performed using sterile technique. Drive line exit site cleaned with Chlora prep applicators x 2, rinsed with saline, allowed to dry, and Sorbaview with bio-patch re-applied. Exit site incorporated and velour completely buried. No redness, tenderness, drainage, or foul odor noted. Driveline anchor replace.     Return in 1 week dressing change per protocol.   [name], RN VAD Coordinator  Office: [phone]  24/7 Pager: [phone]

## 2020-06-04 ENCOUNTER — Other Ambulatory Visit (HOSPITAL_COMMUNITY): Payer: Self-pay | Admitting: *Deleted

## 2020-06-04 DIAGNOSIS — Z95811 Presence of heart assist device: Secondary | ICD-10-CM

## 2020-06-04 DIAGNOSIS — Z7901 Long term (current) use of anticoagulants: Secondary | ICD-10-CM

## 2020-06-09 ENCOUNTER — Other Ambulatory Visit (HOSPITAL_COMMUNITY): Payer: Medicare Other

## 2020-06-16 ENCOUNTER — Other Ambulatory Visit (HOSPITAL_COMMUNITY): Payer: Medicare Other

## 2020-06-18 ENCOUNTER — Ambulatory Visit (HOSPITAL_COMMUNITY)
Admission: RE | Admit: 2020-06-18 | Discharge: 2020-06-18 | Disposition: A | Discharge status: Home / Self Care | Payer: Medicare Other | Source: Ambulatory Visit | Attending: Internal Medicine | Admitting: Internal Medicine

## 2020-06-18 ENCOUNTER — Other Ambulatory Visit: Payer: Self-pay

## 2020-06-18 ENCOUNTER — Ambulatory Visit (HOSPITAL_COMMUNITY): Payer: Self-pay

## 2020-06-18 DIAGNOSIS — Z95811 Presence of heart assist device: Secondary | ICD-10-CM | POA: Diagnosis present

## 2020-06-18 DIAGNOSIS — Z7901 Long term (current) use of anticoagulants: Secondary | ICD-10-CM | POA: Insufficient documentation

## 2020-06-18 LAB — PROTIME-INR
INR: 1.7 — ABNORMAL HIGH (ref 0.8–1.2)
Prothrombin Time: 19.9 seconds — ABNORMAL HIGH (ref 11.4–15.2)

## 2020-06-18 NOTE — Progress Notes (Signed)
CSW met with patient and completed an application online for Medicare D. Patient grateful for the support and assistance. Raquel Sarna, [place], [place]

## 2020-06-18 NOTE — Progress Notes (Signed)
LVAD INR 

## 2020-06-18 NOTE — Progress Notes (Signed)
Patient presents to clinic today for drive line exit wound care. Existing VAD dressing removed and site care performed using sterile technique. Drive line exit site cleaned with Chlora prep applicators x 2, rinsed with saline, allowed to dry, and Sorbaview with bio-patch re-applied. Exit site incorporated and velour completely buried. No redness, tenderness, drainage, or foul odor noted. Driveline anchor replace.     Pt states that his driveline cable is "spliced." today I replaced his modular cable with LOT [id number] ; exp date 02/13/23.     Return in 1 week dressing change per protocol.   [name], RN VAD Coordinator  Office: [phone]  24/7 Pager: [phone]

## 2020-06-25 ENCOUNTER — Other Ambulatory Visit (HOSPITAL_COMMUNITY): Payer: Self-pay | Admitting: Unknown Physician Specialty

## 2020-06-25 DIAGNOSIS — Z95811 Presence of heart assist device: Secondary | ICD-10-CM

## 2020-06-25 DIAGNOSIS — Z7901 Long term (current) use of anticoagulants: Secondary | ICD-10-CM

## 2020-06-29 ENCOUNTER — Other Ambulatory Visit (HOSPITAL_COMMUNITY): Payer: Medicare Other

## 2020-07-06 ENCOUNTER — Ambulatory Visit (HOSPITAL_COMMUNITY)
Admission: RE | Admit: 2020-07-06 | Discharge: 2020-07-06 | Disposition: A | Discharge status: Home / Self Care | Payer: Medicare Other | Source: Ambulatory Visit | Attending: Family Medicine | Admitting: Family Medicine

## 2020-07-06 ENCOUNTER — Other Ambulatory Visit: Payer: Self-pay

## 2020-07-06 ENCOUNTER — Ambulatory Visit (HOSPITAL_COMMUNITY): Payer: Self-pay

## 2020-07-06 DIAGNOSIS — Z95811 Presence of heart assist device: Secondary | ICD-10-CM

## 2020-07-06 DIAGNOSIS — Z7901 Long term (current) use of anticoagulants: Secondary | ICD-10-CM | POA: Diagnosis not present

## 2020-07-06 DIAGNOSIS — Z4801 Encounter for change or removal of surgical wound dressing: Secondary | ICD-10-CM | POA: Insufficient documentation

## 2020-07-06 LAB — PROTIME-INR
INR: 1.7 — ABNORMAL HIGH (ref 0.8–1.2)
Prothrombin Time: 19.6 seconds — ABNORMAL HIGH (ref 11.4–15.2)

## 2020-07-06 NOTE — Progress Notes (Signed)
Patient presents to clinic today for drive line exit wound care. Existing VAD dressing removed and site care performed using sterile technique. Drive line exit site cleaned with Chlora prep applicators x 2, rinsed with saline, allowed to dry, and Sorbaview with bio-patch re-applied. Exit site incorporated and velour completely buried. No redness, tenderness, drainage, or foul odor noted. Driveline anchor replace.     Disability papers filled out today and records printed for the pt.  Return in 1 week dressing change per protocol.   [name], RN VAD Coordinator  Office: [phone]  24/7 Pager: [phone]

## 2020-07-06 NOTE — Progress Notes (Signed)
LVAD INR 

## 2020-07-16 ENCOUNTER — Other Ambulatory Visit: Payer: Self-pay

## 2020-07-16 ENCOUNTER — Ambulatory Visit (HOSPITAL_COMMUNITY)
Admission: RE | Admit: 2020-07-16 | Discharge: 2020-07-16 | Disposition: A | Discharge status: Home / Self Care | Payer: Medicare Other | Source: Ambulatory Visit | Attending: Internal Medicine | Admitting: Internal Medicine

## 2020-07-16 DIAGNOSIS — Z452 Encounter for adjustment and management of vascular access device: Secondary | ICD-10-CM | POA: Diagnosis present

## 2020-07-16 DIAGNOSIS — Z95811 Presence of heart assist device: Secondary | ICD-10-CM

## 2020-07-16 NOTE — Progress Notes (Signed)
Patient presents to clinic today for drive line exit wound care. Existing VAD dressing removed and site care performed using sterile technique. Drive line exit site cleaned with Chlora prep applicators x 2, rinsed with saline, allowed to dry, and Sorbaview with bio-patch re-applied. Exit site incorporated and velour completely buried. No redness, tenderness, drainage, or foul odor noted. Driveline anchor replace.     Return in 1 week dressing change per protocol.   [name], RN VAD Coordinator  Office: [phone]  24/7 Pager: [phone]

## 2020-07-21 ENCOUNTER — Encounter (HOSPITAL_COMMUNITY): Payer: Medicare Other

## 2020-07-26 ENCOUNTER — Other Ambulatory Visit: Payer: Self-pay

## 2020-07-26 ENCOUNTER — Ambulatory Visit (HOSPITAL_COMMUNITY)
Admission: RE | Admit: 2020-07-26 | Discharge: 2020-07-26 | Disposition: A | Discharge status: Home / Self Care | Payer: Medicare Other | Source: Ambulatory Visit | Attending: Internal Medicine | Admitting: Internal Medicine

## 2020-07-26 DIAGNOSIS — Z95811 Presence of heart assist device: Secondary | ICD-10-CM | POA: Diagnosis present

## 2020-07-26 NOTE — Progress Notes (Signed)
Patient presents to clinic today for drive line exit wound care. Existing VAD dressing removed and site care performed using sterile technique. Drive line exit site cleaned with Chlora prep applicators x 2, rinsed with saline, allowed to dry, and Sorbaview with bio-patch re-applied. Exit site incorporated and velour completely buried. No redness, tenderness, drainage, or foul odor noted. Driveline anchor replace.   Provided with 4 weekly kits for home use. Instructed to bring to clinic weekly for dressing change.   Return in 1 week for full follow up visit with Dr Gala Romney.    Alyce Pagan RN VAD Coordinator  Office: [phone]  24/7 Pager: [phone]

## 2020-07-29 ENCOUNTER — Other Ambulatory Visit (HOSPITAL_COMMUNITY): Payer: Self-pay | Admitting: Unknown Physician Specialty

## 2020-07-29 DIAGNOSIS — Z7901 Long term (current) use of anticoagulants: Secondary | ICD-10-CM

## 2020-07-29 DIAGNOSIS — Z95811 Presence of heart assist device: Secondary | ICD-10-CM

## 2020-08-02 ENCOUNTER — Other Ambulatory Visit: Payer: Self-pay

## 2020-08-02 ENCOUNTER — Encounter (HOSPITAL_COMMUNITY): Payer: Self-pay

## 2020-08-02 ENCOUNTER — Ambulatory Visit (HOSPITAL_COMMUNITY)
Admission: RE | Admit: 2020-08-02 | Discharge: 2020-08-02 | Disposition: A | Discharge status: Home / Self Care | Payer: Medicare Other | Source: Ambulatory Visit | Attending: Internal Medicine | Admitting: Internal Medicine

## 2020-08-02 ENCOUNTER — Ambulatory Visit (HOSPITAL_COMMUNITY): Payer: Self-pay

## 2020-08-02 VITALS — BP 90/75 | HR 75 | Ht 72.0 in | Wt 263.4 lb

## 2020-08-02 DIAGNOSIS — Z7982 Long term (current) use of aspirin: Secondary | ICD-10-CM | POA: Diagnosis not present

## 2020-08-02 DIAGNOSIS — Z79899 Other long term (current) drug therapy: Secondary | ICD-10-CM | POA: Diagnosis not present

## 2020-08-02 DIAGNOSIS — Z91030 Bee allergy status: Secondary | ICD-10-CM | POA: Insufficient documentation

## 2020-08-02 DIAGNOSIS — I130 Hypertensive heart and chronic kidney disease with heart failure and stage 1 through stage 4 chronic kidney disease, or unspecified chronic kidney disease: Secondary | ICD-10-CM | POA: Diagnosis present

## 2020-08-02 DIAGNOSIS — Z87891 Personal history of nicotine dependence: Secondary | ICD-10-CM | POA: Insufficient documentation

## 2020-08-02 DIAGNOSIS — Z8249 Family history of ischemic heart disease and other diseases of the circulatory system: Secondary | ICD-10-CM | POA: Diagnosis not present

## 2020-08-02 DIAGNOSIS — Z7984 Long term (current) use of oral hypoglycemic drugs: Secondary | ICD-10-CM | POA: Insufficient documentation

## 2020-08-02 DIAGNOSIS — I5042 Chronic combined systolic (congestive) and diastolic (congestive) heart failure: Secondary | ICD-10-CM | POA: Diagnosis present

## 2020-08-02 DIAGNOSIS — E1122 Type 2 diabetes mellitus with diabetic chronic kidney disease: Secondary | ICD-10-CM | POA: Insufficient documentation

## 2020-08-02 DIAGNOSIS — I428 Other cardiomyopathies: Secondary | ICD-10-CM | POA: Diagnosis not present

## 2020-08-02 DIAGNOSIS — Z95811 Presence of heart assist device: Secondary | ICD-10-CM

## 2020-08-02 DIAGNOSIS — N1831 Chronic kidney disease, stage 3a: Secondary | ICD-10-CM

## 2020-08-02 DIAGNOSIS — I5032 Chronic diastolic (congestive) heart failure: Secondary | ICD-10-CM

## 2020-08-02 DIAGNOSIS — Z7901 Long term (current) use of anticoagulants: Secondary | ICD-10-CM | POA: Insufficient documentation

## 2020-08-02 DIAGNOSIS — E039 Hypothyroidism, unspecified: Secondary | ICD-10-CM | POA: Diagnosis not present

## 2020-08-02 DIAGNOSIS — E1169 Type 2 diabetes mellitus with other specified complication: Secondary | ICD-10-CM

## 2020-08-02 DIAGNOSIS — I10 Essential (primary) hypertension: Secondary | ICD-10-CM

## 2020-08-02 LAB — BASIC METABOLIC PANEL
Anion gap: 9 (ref 5–15)
BUN: 23 mg/dL — ABNORMAL HIGH (ref 6–20)
CO2: 26 mmol/L (ref 22–32)
Calcium: 9.8 mg/dL (ref 8.9–10.3)
Chloride: 102 mmol/L (ref 98–111)
Creatinine, Ser: 1.58 mg/dL — ABNORMAL HIGH (ref 0.61–1.24)
GFR, Estimated: 52 mL/min — ABNORMAL LOW (ref >60)
Glucose, Bld: 203 mg/dL — ABNORMAL HIGH (ref 70–99)
Potassium: 5.3 mmol/L — ABNORMAL HIGH (ref 3.5–5.1)
Sodium: 137 mmol/L (ref 135–145)

## 2020-08-02 LAB — CBC
HCT: 46 % (ref 39.0–52.0)
Hemoglobin: 15 g/dL (ref 13.0–17.0)
MCH: 29.2 pg (ref 26.0–34.0)
MCHC: 32.6 g/dL (ref 30.0–36.0)
MCV: 89.7 fL (ref 80.0–100.0)
Platelets: 232 10*3/uL (ref 150–400)
RBC: 5.13 MIL/uL (ref 4.22–5.81)
RDW: 15.6 % — ABNORMAL HIGH (ref 11.5–15.5)
WBC: 7.5 10*3/uL (ref 4.0–10.5)
nRBC: 0 % (ref 0.0–0.2)

## 2020-08-02 LAB — PROTIME-INR
INR: 1.8 — ABNORMAL HIGH (ref 0.8–1.2)
Prothrombin Time: 20.4 seconds — ABNORMAL HIGH (ref 11.4–15.2)

## 2020-08-02 LAB — LACTATE DEHYDROGENASE: LDH: 350 U/L — ABNORMAL HIGH (ref 98–192)

## 2020-08-02 MED ORDER — WARFARIN SODIUM 6 MG PO TABS: ORAL_TABLET | ORAL | 5 refills | Status: DC

## 2020-08-02 NOTE — Progress Notes (Signed)
LVAD INR 

## 2020-08-02 NOTE — Progress Notes (Signed)
Patient presents for 2 month follow up in VAD Clinic today alone. Reports no problems with VAD equipment or concerns with drive line.   Patient denies any issues. Reports he is still playing golf several times per week. He denies any dizziness or heart failure symptoms.   He reports he is checking blood sugars at home and on average they run around 120.  He says he is trying to cut out red meat from his diet for healthier options.   Vital Signs:        Doppler Pressure: 90 Automatc BP: 90/75 (82)   HR: 75    SPO2: 100% on RA        Weight: 263.4 lb w/ eqt  Last weight: 263.6 lb   VAD Indication: Destination Therapy - due to limited social support. Pt discussed with Dr. Edwena Blow at [facility] prior to LVAD implant.   LVAD assessment:      Speed: 5300 RPM      Flow: 4.2       Power: 3.9 w       PI: 5.4    Alarms: none  Events: 40+ daily  Hct: 20       Fixed speed: 5300 Low speed limit: 5000  Primary Controller:  Replace back up battery in 23 months.  Back up controller:  Replace back up battery in 18 months.  I reviewed the LVAD parameters from today and compared the results to the patient's prior recorded data. LVAD interrogation was STABLE for significant power changes, NEGATIVE for clinical alarms and STABLE for PI events/speed drops. No programming changes were made and pump is functioning within specified parameters.    LVAD equipment check completed and is in good working order. Back-up equipment present.   Annual maintenance completed on patient's home equipment per [facility] 05/26/19.  Exit Site Care: Gene is doing weekly dressing change weekly. Existing VAD dressing removed and site care performed using sterile technique. Drive line exit site cleaned with Chlora prep applicators x 2, allowed to dry, and biopatch w/ Sorbaview dressing applied. Exit site healed and partially incorporated, the velour is fully implanted at exit site. No drainage, redness, tenderness, foul odor or  rash noted. Drive line anchor re-applied. Pt denies fever or chills. Pt provided with box of large tegaderms for home use when he showers.   Device:  N/A   BP & Labs:  Doppler 90 - is reflecting modified systolic BP   Hgb 15.0 - No S/S of bleeding. Specifically denies melena/BRBPR or nosebleeds.   LDH 350 - established baseline of 200 - 400. Denies tea-colored urine. No power elevations noted on interrogation other than above noted incidents.   Patient Instructions:  No change in medications. Return in one week for dressing change. Return in 2 months for full VAD clinic visit.    [name] RN VAD Coordinator  Office: [phone]  24/7 Pager: [phone]

## 2020-08-02 NOTE — Progress Notes (Addendum)
LVAD Clinic Note   Primary Cardiologist: Dr. Gala Romney   HPI: Benjamin Sherman is a 50 y.o. 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 ECHO showed severely reduced EF. 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 Placed on steroids without benefit.  Admitted 11/18 after fall/syncopal episode resulting in bifrontal SAH. Neurosurgery consulted.   Admitted 4/19 with cardiogenic shock in setting of AFL. Supported with inotropes and underwent DC-CV but had persistent shock. After numerous discussions about concern for adequate social support underwent placement of HM-3 LVAD on 05/15/17. D/c home 06/04/17  Here for regular f/u. He is doing great. Continues to play golf and go to the gym regularly. Denies CP or SOB. His elderly mother died recently and he has been sad over that. Denies orthopnea or PND. No fevers, chills or problems with driveline. No bleeding, melena or neuro symptoms. No VAD alarms. Taking all meds as prescribed.   VAD Indication: Destination Therapy - due to limited social support. Pt discussed with Dr. Edwena Blow at [facility] prior to LVAD implant.   LVAD assessment:                                                    Speed: 5300 RPM                                                       Flow: 4.2                                                                      Power: 3.9 w                               PI: 5.4                           Alarms: none Events: 40+ daily Hct: 20                                                              Fixed speed: 5300 Low speed limit: 5000   Primary Controller:  Replace back up battery in 23 months. Back up controller:  Replace back up battery in 18 months.   I reviewed the LVAD parameters from today and compared the results to the patient's prior recorded data. LVAD  interrogation was STABLE for significant power changes, NEGATIVE for clinical alarms and STABLE for PI events/speed drops. No programming changes were made and pump is functioning within specified parameters. Pt is performing daily  controller and system monitor self tests along with completing weekly and monthly maintenance for LVAD equipment.    Past Medical History:  Diagnosis Date   CHF (congestive heart failure) (HCC)    Diabetes mellitus without complication (HCC)     Current Outpatient Medications  Medication Sig Dispense Refill   amLODipine (NORVASC) 5 MG tablet Take 2 tablets (10 mg total) by mouth daily. 180 tablet 3   aspirin 81 MG EC tablet Take 1 tablet (81 mg total) by mouth daily. 90 tablet 3   Blood Glucose Monitoring Suppl (TRUE METRIX METER) DEVI 1 each by Does not apply route daily with breakfast. 1 Device 0   glipiZIDE (GLUCOTROL) 5 MG tablet Take 2 with breakfast and 1 with dinner 90 tablet 11   glucose blood (TRUE METRIX BLOOD GLUCOSE TEST) test strip Use daily before breakfast 30 each 12   pantoprazole (PROTONIX) 40 MG tablet Take 1 tablet (40 mg total) by mouth daily. 30 tablet 11   sacubitril-valsartan (ENTRESTO) 49-51 MG Take 1 tablet by mouth 2 (two) times daily. 60 tablet 4   TRUEPLUS LANCETS 28G MISC 1 each by Does not apply route daily. 30 each 12   warfarin (COUMADIN) 6 MG tablet TAKE 1/2 tablet (3 mg) Tues/Thurs and 1 tablet ( 6mg ) all other days; or as directed by HF clinic 86 tablet 5   enoxaparin (LOVENOX) 40 MG/0.4ML injection Inject 0.4 mLs (40 mg total) into the skin every 12 (twelve) hours. (Patient not taking: No sig reported) 4 mL 0   ferrous sulfate 325 (65 FE) MG EC tablet Take 1 tablet (325 mg total) by mouth 2 (two) times daily. (Patient not taking: No sig reported) 180 tablet 1   No current facility-administered medications for this encounter.   Allergies  Allergen Reactions   Bee Venom     UNSPECIFIED REACTION    Social History    Socioeconomic History   Marital status: Single    Spouse name: Not on file   Number of children: Not on file   Years of education: Not on file   Highest education level: Not on file  Occupational History   Occupation: security  Tobacco Use   Smoking status: Former    Types: Cigars    Quit date: 11/16/2016    Years since quitting: 3.7   Smokeless tobacco: Never   Tobacco comments:    6 cigars per wk  Vaping Use   Vaping Use: Never used  Substance and Sexual Activity   Alcohol use: Yes    Alcohol/week: 4.0 standard drinks    Types: 2 Shots of liquor, 2 Cans of beer per week    Comment: ocasional   Drug use: No   Sexual activity: Not Currently  Other Topics Concern   Not on file  Social History Narrative   Not on file   Social Determinants of Health   Financial Resource Strain: Low Risk    Difficulty of Paying Living Expenses: Not very hard  Food Insecurity: No Food Insecurity   Worried About 13/01/2016 in the Last Year: Never true   Ran Out of Food in the Last Year: Never true  Transportation Needs: No Transportation Needs   Lack of Transportation (Medical): No   Lack of Transportation (Non-Medical): No  Physical Activity: Not on file  Stress: Not on file  Social Connections: Not on file  Intimate Partner Violence: Not on file    Family History  Problem Relation Age of Onset  CAD Father    Vitals:   08/02/20 1009 08/02/20 1057  BP: (!) 90/0 90/75  Pulse:  75  SpO2:  100%  Weight:  119.5 kg (263 lb 6.4 oz)  Height:  6' (1.829 m)    Wt Readings from Last 3 Encounters:  08/02/20 119.5 kg (263 lb 6.4 oz)  05/26/20 119.6 kg (263 lb 9.6 oz)  03/25/20 119.8 kg (264 lb 3.2 oz)       Vital Signs:                                                                 Doppler Pressure: 90 Automatc BP: 90/75 (82)                     HR: 75                          SPO2: 100% on RA                                                       Weight: 263.4 lb w/  eqt Last weight: 263.6 lb    Physical Exam: General:  NAD.  HEENT: normal  Neck: supple. JVP not elevated.  Carotids 2+ bilat; no bruits. No lymphadenopathy or thryomegaly appreciated. Cor: LVAD hum.  Lungs: Clear. Abdomen: obese soft, nontender, non-distended. No hepatosplenomegaly. No bruits or masses. Good bowel sounds. Driveline site clean. Anchor in place.  Extremities: no cyanosis, clubbing, rash. Warm no edema  Neuro: alert & oriented x 3. No focal deficits. Moves all 4 without problem    ASSESSMENT & PLAN:  1. Chronic Combined Systolic/Diastolic Heart Failure. NICM  - ECHO 4/19 EF 20% CMRI EF 22%  - s/p HM-3 LVAD on 05/15/17 - Doing very well with VAD support. Remains NYHA I. - Volume status looks good. Off lasix. Encouraged to keep up with his fluid intake. No change  2. VAD management.  - VAD interrogated personally. Parameters stable. - Has frequent PI events but no low flows. Reminded him to keep up with fluid intake.  - LDH 366 -> 350 - Remains on ASA 81 mg daily + coumadin. - INR pending INR goal 2.0-2.5 Will review PharmD personally. 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 - Continue coumadin - no change  4. HTN - Blood pressure well controlled. Continue current regimen. - Continue Entresto at 49/51. - Encouraged him to keep his weight down   5. Hypothyroidism - improved off amio - TFTs ok.    6. DMII - Per PCP - Doubt he will tolerate SGLT2i with volume issues.   7. CKD 3a - Creatinine baseline 1.5-1.8 - Stable at 1.58 today  Total time spent 35 minutes. Over half that time spent discussing above.   [name], MD 08/02/20

## 2020-08-11 ENCOUNTER — Other Ambulatory Visit: Payer: Self-pay

## 2020-08-11 ENCOUNTER — Ambulatory Visit (HOSPITAL_COMMUNITY)
Admission: RE | Admit: 2020-08-11 | Discharge: 2020-08-11 | Disposition: A | Discharge status: Home / Self Care | Payer: Medicare Other | Source: Ambulatory Visit | Attending: Cardiology | Admitting: Cardiology

## 2020-08-11 DIAGNOSIS — Z95811 Presence of heart assist device: Secondary | ICD-10-CM | POA: Insufficient documentation

## 2020-08-11 NOTE — Progress Notes (Signed)
Patient presents to clinic today for drive line exit wound care.   Existing VAD dressing removed and site care performed using sterile technique. Drive line exit site cleaned with Chlora prep applicators x 2, allowed to dry, and biopatch w/ Sorbaview dressing applied. Dressing covered with 1 large Tegaderm. Exit site healed and fully incorporated, the velour is fully implanted at exit site. No drainage, redness, tenderness, foul odor or rash noted. Drive line anchor re-applied. Pt denies fever or chills. Pt has adequate dressing supplies at home.   Plan:  Return in 1 week for INR and dressing change.  Alyce Pagan RN VAD Coordinator  Office: [phone]  24/7 Pager: [phone]

## 2020-08-13 ENCOUNTER — Other Ambulatory Visit (HOSPITAL_COMMUNITY): Payer: Self-pay | Admitting: Unknown Physician Specialty

## 2020-08-13 DIAGNOSIS — Z95811 Presence of heart assist device: Secondary | ICD-10-CM

## 2020-08-13 DIAGNOSIS — Z7901 Long term (current) use of anticoagulants: Secondary | ICD-10-CM

## 2020-08-18 ENCOUNTER — Ambulatory Visit (HOSPITAL_COMMUNITY): Payer: Self-pay | Admitting: Pharmacist

## 2020-08-18 ENCOUNTER — Other Ambulatory Visit: Payer: Self-pay

## 2020-08-18 ENCOUNTER — Ambulatory Visit (HOSPITAL_COMMUNITY)
Admission: RE | Admit: 2020-08-18 | Discharge: 2020-08-18 | Disposition: A | Discharge status: Home / Self Care | Payer: Medicare Other | Source: Ambulatory Visit | Attending: Internal Medicine | Admitting: Internal Medicine

## 2020-08-18 DIAGNOSIS — Z7901 Long term (current) use of anticoagulants: Secondary | ICD-10-CM | POA: Insufficient documentation

## 2020-08-18 DIAGNOSIS — Z95811 Presence of heart assist device: Secondary | ICD-10-CM | POA: Diagnosis not present

## 2020-08-18 DIAGNOSIS — Z48812 Encounter for surgical aftercare following surgery on the circulatory system: Secondary | ICD-10-CM | POA: Diagnosis not present

## 2020-08-18 LAB — PROTIME-INR
INR: 1.6 — ABNORMAL HIGH (ref 0.8–1.2)
Prothrombin Time: 18.9 seconds — ABNORMAL HIGH (ref 11.4–15.2)

## 2020-08-18 MED ORDER — WARFARIN SODIUM 6 MG PO TABS: ORAL_TABLET | ORAL | 5 refills | Status: DC

## 2020-08-18 NOTE — Progress Notes (Signed)
LVAD INR 

## 2020-08-18 NOTE — Progress Notes (Signed)
Patient presents to clinic today for drive line exit wound care.   Existing VAD dressing removed and site care performed using sterile technique. Redness/irritation noted near previous biopatch site. Will trial dressing without biopatch today. Drive line exit site cleaned with Chlora prep applicators x 2, RINSED WITH SALINE, and allowed to dry. and Sorbaview dressing WITHOUT biopatch, with gauze pad, applied. Dressing covered with 1 large Tegaderm. Exit site healed and fully incorporated, the velour is fully implanted at exit site. No drainage, tenderness, foul odor or rash noted. Drive line anchor re-applied. Pt denies fever or chills. Pt provided with 4 weekly kits for home use. Instructed to bring to clinic each week for dressing change.    Plan:  Return in 1 week for dressing change. Coumadin dosing per Lauren PharmD  Alyce Pagan RN VAD Coordinator  Office: [phone]  24/7 Pager: [phone]

## 2020-08-24 ENCOUNTER — Other Ambulatory Visit (HOSPITAL_COMMUNITY): Payer: Medicare Other

## 2020-08-25 ENCOUNTER — Other Ambulatory Visit (HOSPITAL_COMMUNITY): Payer: Medicare Other

## 2020-08-25 ENCOUNTER — Encounter (HOSPITAL_COMMUNITY): Payer: Medicare Other

## 2020-08-25 ENCOUNTER — Other Ambulatory Visit: Payer: Self-pay

## 2020-08-25 ENCOUNTER — Ambulatory Visit (HOSPITAL_COMMUNITY)
Admission: RE | Admit: 2020-08-25 | Discharge: 2020-08-25 | Disposition: A | Discharge status: Home / Self Care | Payer: Medicare Other | Source: Ambulatory Visit | Attending: Internal Medicine | Admitting: Internal Medicine

## 2020-08-25 DIAGNOSIS — Z95811 Presence of heart assist device: Secondary | ICD-10-CM

## 2020-08-25 DIAGNOSIS — Z4801 Encounter for change or removal of surgical wound dressing: Secondary | ICD-10-CM | POA: Diagnosis present

## 2020-08-25 NOTE — Progress Notes (Signed)
Patient presents to clinic today for drive line exit wound care.   Existing VAD dressing removed and site care performed using sterile technique. Redness/irritation noted last week has improved. Will continue no biopatch at this time. Drive line exit site cleaned with Chlora prep applicators x 2, RINSED WITH SALINE, and allowed to dry. and Sorbaview dressing WITHOUT biopatch, with gauze pad, applied. Dressing covered with 1 large Tegaderm. Exit site healed and fully incorporated, the velour is fully implanted at exit site. No drainage, tenderness, foul odor or rash noted. Drive line anchor re-applied. Pt denies fever or chills. Pt has adequate dressing supplies at home.   Plan:  Return in 1 week for dressing change and INR  Alyce Pagan RN VAD Coordinator  Office: [phone]  24/7 Pager: [phone]

## 2020-08-26 ENCOUNTER — Other Ambulatory Visit (HOSPITAL_COMMUNITY): Payer: Self-pay | Admitting: *Deleted

## 2020-08-26 DIAGNOSIS — Z95811 Presence of heart assist device: Secondary | ICD-10-CM

## 2020-08-26 DIAGNOSIS — Z7901 Long term (current) use of anticoagulants: Secondary | ICD-10-CM

## 2020-08-31 ENCOUNTER — Other Ambulatory Visit: Payer: Self-pay

## 2020-08-31 ENCOUNTER — Ambulatory Visit (HOSPITAL_COMMUNITY): Payer: Self-pay | Admitting: Pharmacist

## 2020-08-31 ENCOUNTER — Ambulatory Visit (HOSPITAL_COMMUNITY)
Admission: RE | Admit: 2020-08-31 | Discharge: 2020-08-31 | Disposition: A | Discharge status: Home / Self Care | Payer: Medicare Other | Source: Ambulatory Visit | Attending: Cardiology | Admitting: Cardiology

## 2020-08-31 DIAGNOSIS — Z7901 Long term (current) use of anticoagulants: Secondary | ICD-10-CM | POA: Insufficient documentation

## 2020-08-31 DIAGNOSIS — Z95811 Presence of heart assist device: Secondary | ICD-10-CM | POA: Insufficient documentation

## 2020-08-31 DIAGNOSIS — Z5181 Encounter for therapeutic drug level monitoring: Secondary | ICD-10-CM | POA: Diagnosis not present

## 2020-08-31 DIAGNOSIS — Z4801 Encounter for change or removal of surgical wound dressing: Secondary | ICD-10-CM | POA: Diagnosis present

## 2020-08-31 LAB — PROTIME-INR
INR: 2.2 — ABNORMAL HIGH (ref 0.8–1.2)
Prothrombin Time: 24.3 seconds — ABNORMAL HIGH (ref 11.4–15.2)

## 2020-08-31 NOTE — Progress Notes (Signed)
Patient presents to clinic today for drive line exit wound care.   Existing VAD dressing removed and site care performed using sterile technique. Redness/irritation noted last week has improved. Will continue no biopatch at this time. Drive line exit site cleaned with Chlora prep applicators x 2, RINSED WITH SALINE, and allowed to dry. and Sorbaview dressing with biopatch applied. Dressing covered with 1 large Tegaderm. Exit site healed and fully incorporated, the velour is fully implanted at exit site. No drainage, tenderness, foul odor or rash noted. Drive line anchor re-applied. Pt denies fever or chills. Pt has adequate dressing supplies at home.   Plan:  Return in 1 week for dressing change only  Raniah Karan RN VAD Coordinator  Office: [phone]  24/7 Pager: [phone]    

## 2020-08-31 NOTE — Progress Notes (Signed)
LVAD INR 

## 2020-09-07 ENCOUNTER — Other Ambulatory Visit (HOSPITAL_COMMUNITY): Payer: Medicare Other

## 2020-09-10 ENCOUNTER — Other Ambulatory Visit (HOSPITAL_COMMUNITY): Payer: Self-pay | Admitting: Unknown Physician Specialty

## 2020-09-10 DIAGNOSIS — Z7901 Long term (current) use of anticoagulants: Secondary | ICD-10-CM

## 2020-09-10 DIAGNOSIS — Z95811 Presence of heart assist device: Secondary | ICD-10-CM

## 2020-09-14 ENCOUNTER — Other Ambulatory Visit (HOSPITAL_COMMUNITY): Payer: Medicare Other

## 2020-09-23 ENCOUNTER — Ambulatory Visit (HOSPITAL_COMMUNITY)
Admission: RE | Admit: 2020-09-23 | Discharge: 2020-09-23 | Disposition: A | Discharge status: Home / Self Care | Payer: Medicare Other | Source: Ambulatory Visit | Attending: Internal Medicine | Admitting: Internal Medicine

## 2020-09-23 ENCOUNTER — Other Ambulatory Visit: Payer: Self-pay

## 2020-09-23 ENCOUNTER — Ambulatory Visit (HOSPITAL_COMMUNITY): Payer: Self-pay | Admitting: Pharmacist

## 2020-09-23 DIAGNOSIS — Z95811 Presence of heart assist device: Secondary | ICD-10-CM | POA: Insufficient documentation

## 2020-09-23 DIAGNOSIS — Z7901 Long term (current) use of anticoagulants: Secondary | ICD-10-CM | POA: Diagnosis not present

## 2020-09-23 DIAGNOSIS — Z452 Encounter for adjustment and management of vascular access device: Secondary | ICD-10-CM | POA: Insufficient documentation

## 2020-09-23 LAB — PROTIME-INR
INR: 2.6 — ABNORMAL HIGH (ref 0.8–1.2)
Prothrombin Time: 28.1 seconds — ABNORMAL HIGH (ref 11.4–15.2)

## 2020-09-23 NOTE — Progress Notes (Signed)
LVAD INR 

## 2020-09-23 NOTE — Progress Notes (Signed)
Patient presents to clinic today for drive line exit wound care.   Existing VAD dressing removed and site care performed using sterile technique. Redness/irritation noted last week has improved. Will continue no biopatch at this time. Drive line exit site cleaned with Chlora prep applicators x 2, RINSED WITH SALINE, and allowed to dry. and Sorbaview dressing with biopatch applied. Dressing covered with 1 large Tegaderm. Exit site healed and fully incorporated, the velour is fully implanted at exit site. No drainage, tenderness, foul odor or rash noted. Drive line anchor re-applied. Pt denies fever or chills. Pt has adequate dressing supplies at home.   Plan:  Return in 1 week for dressing change only  [name] RN VAD Coordinator  Office: [phone]  24/7 Pager: [phone]

## 2020-09-30 ENCOUNTER — Other Ambulatory Visit: Payer: Self-pay

## 2020-09-30 ENCOUNTER — Ambulatory Visit (HOSPITAL_COMMUNITY)
Admission: RE | Admit: 2020-09-30 | Discharge: 2020-09-30 | Disposition: A | Discharge status: Home / Self Care | Payer: Medicare Other | Source: Ambulatory Visit | Attending: Cardiology | Admitting: Cardiology

## 2020-09-30 DIAGNOSIS — Z95811 Presence of heart assist device: Secondary | ICD-10-CM | POA: Insufficient documentation

## 2020-09-30 DIAGNOSIS — Z4801 Encounter for change or removal of surgical wound dressing: Secondary | ICD-10-CM | POA: Insufficient documentation

## 2020-09-30 NOTE — Progress Notes (Signed)
Patient presents to clinic today for drive line exit wound care.   Existing VAD dressing removed and site care performed using sterile technique. Drive line exit site cleaned with Chlora prep applicators x 2, RINSED WITH SALINE, and allowed to dry. and Sorbaview dressing with Silverlon applied. Dressing covered with 1 large Tegaderm. Exit site healed and fully incorporated, the velour is fully implanted at exit site. No drainage, tenderness, foul odor or rash noted. Drive line anchor re-applied. Pt denies fever or chills. Pt has adequate dressing supplies at home.   Plan:  Return in 1 week for dressing change and INR.   [name] RN VAD Coordinator  Office: [phone]  24/7 Pager: [phone]

## 2020-10-01 ENCOUNTER — Other Ambulatory Visit (HOSPITAL_COMMUNITY): Payer: Self-pay | Admitting: *Deleted

## 2020-10-01 DIAGNOSIS — Z95811 Presence of heart assist device: Secondary | ICD-10-CM

## 2020-10-01 DIAGNOSIS — Z7901 Long term (current) use of anticoagulants: Secondary | ICD-10-CM

## 2020-10-06 ENCOUNTER — Encounter (HOSPITAL_COMMUNITY): Payer: Medicare Other

## 2020-10-07 ENCOUNTER — Other Ambulatory Visit (HOSPITAL_COMMUNITY): Payer: Medicare Other

## 2020-10-14 ENCOUNTER — Other Ambulatory Visit (HOSPITAL_COMMUNITY): Payer: Medicare Other

## 2020-10-18 ENCOUNTER — Encounter (HOSPITAL_COMMUNITY): Payer: Medicare Other

## 2020-10-18 ENCOUNTER — Other Ambulatory Visit (HOSPITAL_COMMUNITY): Payer: Self-pay | Admitting: Unknown Physician Specialty

## 2020-10-18 DIAGNOSIS — Z7901 Long term (current) use of anticoagulants: Secondary | ICD-10-CM

## 2020-10-18 DIAGNOSIS — Z95811 Presence of heart assist device: Secondary | ICD-10-CM

## 2020-10-19 ENCOUNTER — Other Ambulatory Visit (HOSPITAL_COMMUNITY): Payer: Self-pay | Admitting: *Deleted

## 2020-10-19 ENCOUNTER — Ambulatory Visit (HOSPITAL_COMMUNITY): Payer: Self-pay | Admitting: Pharmacist

## 2020-10-19 ENCOUNTER — Other Ambulatory Visit: Payer: Self-pay

## 2020-10-19 ENCOUNTER — Encounter (HOSPITAL_COMMUNITY): Payer: Self-pay

## 2020-10-19 ENCOUNTER — Ambulatory Visit (HOSPITAL_COMMUNITY)
Admission: RE | Admit: 2020-10-19 | Discharge: 2020-10-19 | Disposition: A | Discharge status: Home / Self Care | Payer: Medicare Other | Source: Ambulatory Visit | Attending: Cardiology | Admitting: Cardiology

## 2020-10-19 VITALS — BP 115/57 | HR 81 | Temp 98.2°F | Ht 72.0 in | Wt 268.8 lb

## 2020-10-19 DIAGNOSIS — Z95811 Presence of heart assist device: Secondary | ICD-10-CM | POA: Insufficient documentation

## 2020-10-19 DIAGNOSIS — Z7982 Long term (current) use of aspirin: Secondary | ICD-10-CM | POA: Diagnosis not present

## 2020-10-19 DIAGNOSIS — I4892 Unspecified atrial flutter: Secondary | ICD-10-CM | POA: Insufficient documentation

## 2020-10-19 DIAGNOSIS — I428 Other cardiomyopathies: Secondary | ICD-10-CM | POA: Insufficient documentation

## 2020-10-19 DIAGNOSIS — N1831 Chronic kidney disease, stage 3a: Secondary | ICD-10-CM | POA: Insufficient documentation

## 2020-10-19 DIAGNOSIS — Z7984 Long term (current) use of oral hypoglycemic drugs: Secondary | ICD-10-CM | POA: Insufficient documentation

## 2020-10-19 DIAGNOSIS — Z8249 Family history of ischemic heart disease and other diseases of the circulatory system: Secondary | ICD-10-CM | POA: Insufficient documentation

## 2020-10-19 DIAGNOSIS — I10 Essential (primary) hypertension: Secondary | ICD-10-CM

## 2020-10-19 DIAGNOSIS — I5022 Chronic systolic (congestive) heart failure: Secondary | ICD-10-CM

## 2020-10-19 DIAGNOSIS — E1122 Type 2 diabetes mellitus with diabetic chronic kidney disease: Secondary | ICD-10-CM | POA: Diagnosis not present

## 2020-10-19 DIAGNOSIS — Z7901 Long term (current) use of anticoagulants: Secondary | ICD-10-CM | POA: Diagnosis not present

## 2020-10-19 DIAGNOSIS — I130 Hypertensive heart and chronic kidney disease with heart failure and stage 1 through stage 4 chronic kidney disease, or unspecified chronic kidney disease: Secondary | ICD-10-CM | POA: Insufficient documentation

## 2020-10-19 DIAGNOSIS — Z91030 Bee allergy status: Secondary | ICD-10-CM | POA: Diagnosis not present

## 2020-10-19 DIAGNOSIS — Z87891 Personal history of nicotine dependence: Secondary | ICD-10-CM | POA: Diagnosis not present

## 2020-10-19 DIAGNOSIS — I5032 Chronic diastolic (congestive) heart failure: Secondary | ICD-10-CM

## 2020-10-19 DIAGNOSIS — E1169 Type 2 diabetes mellitus with other specified complication: Secondary | ICD-10-CM

## 2020-10-19 DIAGNOSIS — I480 Paroxysmal atrial fibrillation: Secondary | ICD-10-CM

## 2020-10-19 DIAGNOSIS — I5042 Chronic combined systolic (congestive) and diastolic (congestive) heart failure: Secondary | ICD-10-CM | POA: Diagnosis not present

## 2020-10-19 LAB — CBC
HCT: 43.3 % (ref 39.0–52.0)
Hemoglobin: 14 g/dL (ref 13.0–17.0)
MCH: 28.4 pg (ref 26.0–34.0)
MCHC: 32.3 g/dL (ref 30.0–36.0)
MCV: 87.8 fL (ref 80.0–100.0)
Platelets: 245 10*3/uL (ref 150–400)
RBC: 4.93 MIL/uL (ref 4.22–5.81)
RDW: 15.1 % (ref 11.5–15.5)
WBC: 7.9 10*3/uL (ref 4.0–10.5)
nRBC: 0 % (ref 0.0–0.2)

## 2020-10-19 LAB — COMPREHENSIVE METABOLIC PANEL
ALT: 26 U/L (ref 0–44)
AST: 43 U/L — ABNORMAL HIGH (ref 15–41)
Albumin: 3.4 g/dL — ABNORMAL LOW (ref 3.5–5.0)
Alkaline Phosphatase: 36 U/L — ABNORMAL LOW (ref 38–126)
Anion gap: 8 (ref 5–15)
BUN: 22 mg/dL — ABNORMAL HIGH (ref 6–20)
CO2: 27 mmol/L (ref 22–32)
Calcium: 9.3 mg/dL (ref 8.9–10.3)
Chloride: 100 mmol/L (ref 98–111)
Creatinine, Ser: 1.49 mg/dL — ABNORMAL HIGH (ref 0.61–1.24)
GFR, Estimated: 56 mL/min — ABNORMAL LOW (ref >60)
Glucose, Bld: 243 mg/dL — ABNORMAL HIGH (ref 70–99)
Potassium: 5 mmol/L (ref 3.5–5.1)
Sodium: 135 mmol/L (ref 135–145)
Total Bilirubin: 0.7 mg/dL (ref 0.3–1.2)
Total Protein: 7.5 g/dL (ref 6.5–8.1)

## 2020-10-19 LAB — LACTATE DEHYDROGENASE: LDH: 309 U/L — ABNORMAL HIGH (ref 98–192)

## 2020-10-19 LAB — PROTIME-INR
INR: 2.6 — ABNORMAL HIGH (ref 0.8–1.2)
Prothrombin Time: 27.5 seconds — ABNORMAL HIGH (ref 11.4–15.2)

## 2020-10-19 MED ORDER — GLIPIZIDE 5 MG PO TABS: ORAL_TABLET | ORAL | 3 refills | Status: DC

## 2020-10-19 NOTE — Patient Instructions (Addendum)
Rx for Glipizide sent to local pharmacy as requested. 2.   PharmD will call you with INR results and warfarin dosing. 3.   Return for dressing change as needed. 4.   Return to VAD Clinic in 2 months with full echo that day.

## 2020-10-19 NOTE — Progress Notes (Addendum)
Patient presents for 2 month follow up and 3.5 yr Intermacs in [facility] today alone. Reports no problems with VAD equipment or concerns with drive line.   Patient denies any issues. Reports he is still playing golf several times per week. He denies any dizziness or heart failure symptoms.    Vital Signs:   Temp: 98.2     Doppler Pressure: 84 Automatc BP:  115/57 (79)   HR: 81   SPO2: 99% on RA        Weight: 268.8 lb w/ eqt  Last weight: 263.4 lb   VAD Indication: Destination Therapy - due to limited social support. Pt discussed with Dr. Mosetta Pigeon at [facility] prior to LVAD implant.   LVAD assessment:      Speed: 5300 RPM      Flow: 4.5       Power: 4.0        PI: 5.4   Alarms: few LV advisories Events: 60 PI events yesterday Hct: 20       Fixed speed: 5300 Low speed limit: 5000  Primary Controller:  Replace back up battery in 20 months.  Back up controller:  Replace back up battery in 15 months.  I reviewed the LVAD parameters from today and compared the results to the patient's prior recorded data. LVAD interrogation was STABLE for significant power changes, NEGATIVE for clinical alarms and STABLE for PI events/speed drops. No programming changes were made and pump is functioning within specified parameters.    LVAD equipment check completed and is in good working order. Back-up equipment present.   Annual maintenance completed on patient's home equipment per [facility] 05/26/20.  Exit Site Care: Existing VAD dressing removed and site care performed using sterile technique. Drive line exit site cleaned with Chlora prep applicators x 2, allowed to dry, and Silverlon patch w/ Sorbaview dressing applied. Exit site incorporated, the velour is fully implanted at exit site. No drainage, redness, tenderness, foul odor or rash noted. Drive line anchor re-applied. Pt denies fever or chills. Pt provided with box of large tegaderms for home use when he showers along with 3 weekly dressing kits  and 7 extra anchors for home use. Asked patient to bring dressing kit to each dressing change visit.   Device:  N/A   BP & Labs:  Doppler 84 - is reflecting modified systolic BP   Hgb 48.8 - No S/S of bleeding. Specifically denies melena/BRBPR or nosebleeds.   LDH 309 - established baseline of 200 - 400. Denies tea-colored urine. No power elevations noted on interrogation other than above noted incidents.   3.5 year Intermacs follow up completed including: Quality of Life, KCCQ-12, and Neurocognitive trail making.   Pt completed 1400 feet during 6 minute walk.  [place] Cardiomyopathy Questionnaire  KCCQ-12 10/19/2020 05/26/2020  1 a. Ability to shower/bathe Not at all limited Not at all limited  1 b. Ability to walk 1 block Not at all limited Not at all limited  1 c. Ability to hurry/jog Not at all limited Not at all limited  2. Edema feet/ankles/legs Less than once a week Never over the past 2 weeks  3. Limited by fatigue Never over the past 2 weeks Never over the past 2 weeks  4. Limited by dyspnea Never over the past 2 weeks Never over the past 2 weeks  5. Sitting up / on 3+ pillows Never over the past 2 weeks Never over the past 2 weeks  6. Limited enjoyment of life Slightly  limited Slightly limited  7. Rest of life w/ symptoms Mostly dissatisfied Mostly dissatisfied  8 a. Participation in hobbies Did not limit at all Slightly limited  8 b. Participation in chores Did not limit at all Did not limit at all  8 c. Visiting family/friends Did not limit at all Did not limit at all      Back up controller: 11V backup battery charged during this visit.   Patient Instructions:  Rx for Glipizide sent to local pharmacy as requested. 2.   PharmD will call you with INR results and warfarin dosing. 3.   Return for dressing change as needed. 4.   Return to [facility] in 2 months with full echo that day.    Zada Girt RN [place] Coordinator  Office: [phone]  24/7 Pager:  [phone]

## 2020-10-19 NOTE — Progress Notes (Signed)
LVAD INR 

## 2020-10-20 ENCOUNTER — Encounter (HOSPITAL_COMMUNITY): Payer: Medicare Other

## 2020-10-21 NOTE — Progress Notes (Signed)
LVAD Clinic Note   Primary Cardiologist: Dr. Gala Romney   HPI: Benjamin Sherman is a 50 y.o. 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 ECHO showed severely reduced EF. 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 Placed on steroids without benefit.  Admitted 11/18 after fall/syncopal episode resulting in bifrontal SAH. Neurosurgery consulted.   Admitted 4/19 with cardiogenic shock in setting of AFL. Supported with inotropes and underwent DC-CV but had persistent shock. After numerous discussions about concern for adequate social support underwent placement of HM-3 LVAD on 05/15/17. D/c home 06/04/17  Here for regular f/u. He is doing great. Continues to play golf and go to the gym regularly. Denies CP or SOB. His elderly mother died recently and he has been sad over that. Denies orthopnea or PND. No fevers, chills or problems with driveline. No bleeding, melena or neuro symptoms. No VAD alarms. Taking all meds as prescribed.    VAD Indication: Destination Therapy - due to limited social support. Pt discussed with Dr. Edwena Blow at [facility] prior to LVAD implant.   LVAD assessment:                                                    Speed: 5300 RPM                                                       Flow: 4.5                                                                      Power: 4.0                                   PI: 5.4               Alarms: few LV advisories Events: 60 PI events yesterday Hct: 20                                                              Fixed speed: 5300 Low speed limit: 5000   Primary Controller:  Replace back up battery in 20 months.  Back up controller:  Replace back up battery in 15 months.   I reviewed the LVAD parameters from today and compared the results to the patient's prior recorded  data. LVAD interrogation was STABLE for significant power changes, NEGATIVE for clinical alarms and STABLE for PI events/speed drops. No programming changes were made and pump is functioning within specified parameters.    LVAD equipment check completed  and is in good working order. Back-up equipment present.    Annual maintenance completed on patient's home equipment per [facility] 05/26/20.    Past Medical History:  Diagnosis Date   CHF (congestive heart failure) (HCC)    Diabetes mellitus without complication (HCC)     Current Outpatient Medications  Medication Sig Dispense Refill   amLODipine (NORVASC) 5 MG tablet Take 2 tablets (10 mg total) by mouth daily. 180 tablet 3   aspirin 81 MG EC tablet Take 1 tablet (81 mg total) by mouth daily. 90 tablet 3   Blood Glucose Monitoring Suppl (TRUE METRIX METER) DEVI 1 each by Does not apply route daily with breakfast. 1 Device 0   glucose blood (TRUE METRIX BLOOD GLUCOSE TEST) test strip Use daily before breakfast 30 each 12   pantoprazole (PROTONIX) 40 MG tablet Take 1 tablet (40 mg total) by mouth daily. 30 tablet 11   sacubitril-valsartan (ENTRESTO) 49-51 MG Take 1 tablet by mouth 2 (two) times daily. 60 tablet 4   TRUEPLUS LANCETS 28G MISC 1 each by Does not apply route daily. 30 each 12   warfarin (COUMADIN) 6 MG tablet TAKE 1/2 tablet (3 mg) Tues/Thurs and 1 tablet (6mg ) all other days; or as directed by HF clinic 86 tablet 5   enoxaparin (LOVENOX) 40 MG/0.4ML injection Inject 0.4 mLs (40 mg total) into the skin every 12 (twelve) hours. (Patient not taking: No sig reported) 4 mL 0   glipiZIDE (GLUCOTROL) 5 MG tablet Take 2 with breakfast and 1 with dinner 270 tablet 3   No current facility-administered medications for this encounter.   Allergies  Allergen Reactions   Bee Venom     UNSPECIFIED REACTION    Social History   Socioeconomic History   Marital status: Single    Spouse name: Not on file   Number of children: Not on file    Years of education: Not on file   Highest education level: Not on file  Occupational History   Occupation: security  Tobacco Use   Smoking status: Former    Types: Cigars    Quit date: 11/16/2016    Years since quitting: 3.9   Smokeless tobacco: Never   Tobacco comments:    6 cigars per wk  Vaping Use   Vaping Use: Never used  Substance and Sexual Activity   Alcohol use: Yes    Alcohol/week: 4.0 standard drinks    Types: 2 Shots of liquor, 2 Cans of beer per week    Comment: ocasional   Drug use: No   Sexual activity: Not Currently  Other Topics Concern   Not on file  Social History Narrative   Not on file   Social Determinants of Health   Financial Resource Strain: Low Risk    Difficulty of Paying Living Expenses: Not very hard  Food Insecurity: No Food Insecurity   Worried About 13/01/2016 in the Last Year: Never true   Ran Out of Food in the Last Year: Never true  Transportation Needs: No Transportation Needs   Lack of Transportation (Medical): No   Lack of Transportation (Non-Medical): No  Physical Activity: Not on file  Stress: Not on file  Social Connections: Not on file  Intimate Partner Violence: Not on file    Family History  Problem Relation Age of Onset   CAD Father    Vitals:   10/19/20 0929 10/19/20 0931  BP: (!) 84/0 (!) 115/57  Pulse:  81  Temp:  98.2 F (36.8 C)  SpO2:  99%  Weight:  121.9 kg (268 lb 12.8 oz)  Height:  6' (1.829 m)    Wt Readings from Last 3 Encounters:  10/19/20 121.9 kg (268 lb 12.8 oz)  08/02/20 119.5 kg (263 lb 6.4 oz)  05/26/20 119.6 kg (263 lb 9.6 oz)    Vital Signs:     Temp: 98.2                                           Doppler Pressure: 84 Automatc BP:  115/57 (79)                  HR: 81              SPO2: 99% on RA                                                         Weight: 268.8 lb w/ eqt  Last weight: 263.4 lb      Physical Exam: General:  NAD.  HEENT: normal  Neck: supple. JVP not  elevated.  Carotids 2+ bilat; no bruits. No lymphadenopathy or thryomegaly appreciated. Cor: LVAD hum.  Lungs: Clear. Abdomen: obese soft, nontender, non-distended. No hepatosplenomegaly. No bruits or masses. Good bowel sounds. Driveline site clean. Anchor in place.  Extremities: no cyanosis, clubbing, rash. Warm no edema  Neuro: alert & oriented x 3. No focal deficits. Moves all 4 without problem     ASSESSMENT & PLAN:  1. Chronic Combined Systolic/Diastolic Heart Failure. NICM  - ECHO 4/19 EF 20% CMRI EF 22%  - s/p HM-3 LVAD on 05/15/17 - Doing very well with VAD support. Remains NYHA I  - Volume status looks good Off lasix. Encouraged to keep up with fluid intake.   2. VAD management.  - VAD interrogated personally. Parameters stable. - Continues with frequent PI events but no low flows. Reminded him to keep up with fluid intake.  - LDH 350 -> 309 - Remains on ASA 81 mg daily + coumadin. - INR 2.6 INR goal 2.0-2.5  Discussed dosing with PharmD personally. - DL site looks great   3. 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 Off amio  - Continue coumadin  4. HTN - Blood pressure well controlled. Continue current regimen. - Continue Entresto at 49/51.  5. DMII - Per PCP - Doubt he will tolerate SGLT2i with volume issues.   7. CKD 3a - Creatinine baseline 1.5-1.8 - Stable at 1.49 today  Total time spent 35 minutes. Over half that time spent discussing above.     [name], MD 10/21/20

## 2020-10-28 ENCOUNTER — Other Ambulatory Visit (HOSPITAL_COMMUNITY): Payer: Self-pay | Admitting: *Deleted

## 2020-10-28 DIAGNOSIS — Z7901 Long term (current) use of anticoagulants: Secondary | ICD-10-CM

## 2020-10-28 DIAGNOSIS — I5022 Chronic systolic (congestive) heart failure: Secondary | ICD-10-CM

## 2020-10-28 DIAGNOSIS — Z95811 Presence of heart assist device: Secondary | ICD-10-CM

## 2020-11-03 ENCOUNTER — Ambulatory Visit (HOSPITAL_COMMUNITY)
Admission: RE | Admit: 2020-11-03 | Discharge: 2020-11-03 | Disposition: A | Discharge status: Home / Self Care | Payer: Medicare Other | Source: Ambulatory Visit | Attending: Cardiology | Admitting: Cardiology

## 2020-11-03 ENCOUNTER — Other Ambulatory Visit: Payer: Self-pay

## 2020-11-03 ENCOUNTER — Ambulatory Visit (HOSPITAL_COMMUNITY): Payer: Self-pay | Admitting: Pharmacist

## 2020-11-03 DIAGNOSIS — Z95811 Presence of heart assist device: Secondary | ICD-10-CM | POA: Diagnosis not present

## 2020-11-03 DIAGNOSIS — Z452 Encounter for adjustment and management of vascular access device: Secondary | ICD-10-CM | POA: Diagnosis present

## 2020-11-03 DIAGNOSIS — Z7901 Long term (current) use of anticoagulants: Secondary | ICD-10-CM | POA: Diagnosis not present

## 2020-11-03 DIAGNOSIS — I5022 Chronic systolic (congestive) heart failure: Secondary | ICD-10-CM | POA: Insufficient documentation

## 2020-11-03 LAB — PROTIME-INR
INR: 2.4 — ABNORMAL HIGH (ref 0.8–1.2)
Prothrombin Time: 26.3 seconds — ABNORMAL HIGH (ref 11.4–15.2)

## 2020-11-03 NOTE — Progress Notes (Signed)
LVAD INR 

## 2020-11-03 NOTE — Progress Notes (Signed)
Patient presents to clinic today for INR and drive line exit wound care.   Existing VAD dressing removed and site care performed using sterile technique. Drive line exit site cleaned with Chlora prep applicators x 2, RINSED WITH SALINE, and allowed to dry. and Sorbaview dressing with Silverlon applied. Dressing covered with 1 large Tegaderm. Exit site healed and fully incorporated, the velour is fully implanted at exit site. No drainage, tenderness, foul odor or rash noted. Drive line anchor re-applied. Pt denies fever or chills. Pt has adequate dressing supplies at home.   Plan:  Return in 1 week for dressing change    Alyce Pagan RN VAD Coordinator  Office: [phone]  24/7 Pager: [phone]

## 2020-11-09 ENCOUNTER — Other Ambulatory Visit (HOSPITAL_COMMUNITY): Payer: Medicare Other

## 2020-11-11 ENCOUNTER — Other Ambulatory Visit (HOSPITAL_COMMUNITY): Payer: Self-pay | Admitting: *Deleted

## 2020-11-11 DIAGNOSIS — Z95811 Presence of heart assist device: Secondary | ICD-10-CM

## 2020-11-11 DIAGNOSIS — Z7901 Long term (current) use of anticoagulants: Secondary | ICD-10-CM

## 2020-11-11 DIAGNOSIS — I5022 Chronic systolic (congestive) heart failure: Secondary | ICD-10-CM

## 2020-11-18 ENCOUNTER — Ambulatory Visit (HOSPITAL_COMMUNITY)
Admission: RE | Admit: 2020-11-18 | Discharge: 2020-11-18 | Disposition: A | Discharge status: Home / Self Care | Payer: Medicare Other | Source: Ambulatory Visit | Attending: Cardiology | Admitting: Cardiology

## 2020-11-18 ENCOUNTER — Other Ambulatory Visit: Payer: Self-pay

## 2020-11-18 ENCOUNTER — Ambulatory Visit (HOSPITAL_COMMUNITY): Payer: Self-pay | Admitting: Pharmacist

## 2020-11-18 DIAGNOSIS — Z7901 Long term (current) use of anticoagulants: Secondary | ICD-10-CM | POA: Diagnosis not present

## 2020-11-18 DIAGNOSIS — Z95811 Presence of heart assist device: Secondary | ICD-10-CM | POA: Diagnosis not present

## 2020-11-18 DIAGNOSIS — I5022 Chronic systolic (congestive) heart failure: Secondary | ICD-10-CM

## 2020-11-18 DIAGNOSIS — Z5181 Encounter for therapeutic drug level monitoring: Secondary | ICD-10-CM | POA: Diagnosis present

## 2020-11-18 LAB — PROTIME-INR
INR: 2.5 — ABNORMAL HIGH (ref 0.8–1.2)
Prothrombin Time: 26.6 seconds — ABNORMAL HIGH (ref 11.4–15.2)

## 2020-11-18 NOTE — Progress Notes (Addendum)
Patient presents to clinic today for INR and drive line exit wound care.   Existing VAD dressing removed and site care performed using sterile technique. Drive line exit site cleaned with Chlora prep applicators x 2, RINSED WITH SALINE, and allowed to dry. and Sorbaview dressing with Silverlon applied. Dressing covered with 1 large Tegaderm. Exit site healed and fully incorporated, the velour is fully implanted at exit site. No drainage, tenderness, foul odor or rash noted. Drive line anchor re-applied. Pt denies fever or chills. Pt has adequate dressing supplies at home. Provided patient with 4 weekly dressing kits and asked him to bring one to each clinic visit.   Plan:  Return in 1 week for dressing change    [name] RN VAD Coordinator  Office: [phone]  24/7 Pager: [phone]

## 2020-11-18 NOTE — Progress Notes (Signed)
LVAD INR 

## 2020-11-19 ENCOUNTER — Other Ambulatory Visit (HOSPITAL_COMMUNITY): Payer: Self-pay | Admitting: Internal Medicine

## 2020-11-19 DIAGNOSIS — Z95811 Presence of heart assist device: Secondary | ICD-10-CM

## 2020-11-26 ENCOUNTER — Other Ambulatory Visit (HOSPITAL_COMMUNITY): Payer: Medicare Other

## 2020-11-30 ENCOUNTER — Other Ambulatory Visit (HOSPITAL_COMMUNITY): Payer: Self-pay | Admitting: Unknown Physician Specialty

## 2020-11-30 DIAGNOSIS — Z7901 Long term (current) use of anticoagulants: Secondary | ICD-10-CM

## 2020-11-30 DIAGNOSIS — Z95811 Presence of heart assist device: Secondary | ICD-10-CM

## 2020-12-01 ENCOUNTER — Ambulatory Visit (HOSPITAL_COMMUNITY)
Admission: RE | Admit: 2020-12-01 | Discharge: 2020-12-01 | Disposition: A | Discharge status: Home / Self Care | Payer: Medicare Other | Source: Ambulatory Visit | Attending: Cardiology | Admitting: Cardiology

## 2020-12-01 ENCOUNTER — Ambulatory Visit (HOSPITAL_COMMUNITY): Payer: Self-pay | Admitting: Pharmacist

## 2020-12-01 DIAGNOSIS — Z95811 Presence of heart assist device: Secondary | ICD-10-CM | POA: Diagnosis not present

## 2020-12-01 DIAGNOSIS — Z4801 Encounter for change or removal of surgical wound dressing: Secondary | ICD-10-CM | POA: Diagnosis present

## 2020-12-01 DIAGNOSIS — Z7901 Long term (current) use of anticoagulants: Secondary | ICD-10-CM | POA: Diagnosis not present

## 2020-12-01 LAB — PROTIME-INR
INR: 2.6 — ABNORMAL HIGH (ref 0.8–1.2)
Prothrombin Time: 27.4 seconds — ABNORMAL HIGH (ref 11.4–15.2)

## 2020-12-01 MED ORDER — WARFARIN SODIUM 6 MG PO TABS: ORAL_TABLET | ORAL | 5 refills | Status: DC

## 2020-12-01 NOTE — Progress Notes (Signed)
Patient presents to clinic today for INR and drive line exit wound care.   Existing VAD dressing removed and site care performed using sterile technique. Drive line exit site cleaned with Chlora prep applicators x 2, RINSED WITH SALINE, and allowed to dry. and Sorbaview dressing with Silverlon applied. Dressing covered with 1 large Tegaderm. Exit site healed and fully incorporated, the velour is fully implanted at exit site. No drainage, tenderness, foul odor or rash noted. Drive line anchor re-applied. Pt denies fever or chills. Pt has adequate dressing supplies at home.    Plan:  Return in 1 week for dressing change    [name] RN VAD Coordinator  Office: [phone]  24/7 Pager: [phone]

## 2020-12-01 NOTE — Progress Notes (Signed)
LVAD INR 

## 2020-12-03 ENCOUNTER — Other Ambulatory Visit (HOSPITAL_COMMUNITY): Payer: Self-pay | Admitting: Unknown Physician Specialty

## 2020-12-03 DIAGNOSIS — Z95811 Presence of heart assist device: Secondary | ICD-10-CM

## 2020-12-03 DIAGNOSIS — Z7901 Long term (current) use of anticoagulants: Secondary | ICD-10-CM

## 2020-12-08 ENCOUNTER — Other Ambulatory Visit (HOSPITAL_COMMUNITY): Payer: Medicare Other

## 2020-12-21 ENCOUNTER — Ambulatory Visit (HOSPITAL_COMMUNITY): Payer: Self-pay | Admitting: Pharmacist

## 2020-12-21 ENCOUNTER — Other Ambulatory Visit: Payer: Self-pay

## 2020-12-21 ENCOUNTER — Ambulatory Visit (HOSPITAL_COMMUNITY)
Admission: RE | Admit: 2020-12-21 | Discharge: 2020-12-21 | Disposition: A | Discharge status: Home / Self Care | Payer: Medicare Other | Source: Ambulatory Visit | Attending: Cardiology | Admitting: Cardiology

## 2020-12-21 DIAGNOSIS — Z7901 Long term (current) use of anticoagulants: Secondary | ICD-10-CM | POA: Insufficient documentation

## 2020-12-21 DIAGNOSIS — Z452 Encounter for adjustment and management of vascular access device: Secondary | ICD-10-CM | POA: Diagnosis present

## 2020-12-21 DIAGNOSIS — Z95811 Presence of heart assist device: Secondary | ICD-10-CM | POA: Diagnosis not present

## 2020-12-21 LAB — PROTIME-INR
INR: 2.2 — ABNORMAL HIGH (ref 0.8–1.2)
Prothrombin Time: 24.7 seconds — ABNORMAL HIGH (ref 11.4–15.2)

## 2020-12-21 NOTE — Progress Notes (Signed)
Patient presents to clinic today for INR and drive line exit wound care.   Existing VAD dressing removed and site care performed using sterile technique. Drive line exit site cleaned with Chlora prep applicators x 2, RINSED WITH SALINE, and allowed to dry. and Sorbaview dressing with Silverlon applied. Dressing covered with 1 large Tegaderm. Exit site healed and fully incorporated, the velour is fully implanted at exit site. No drainage, tenderness, foul odor or rash noted. Drive line anchor re-applied. Pt denies fever or chills. Pt provided with 4 weekly dressing kits for home use. Instructed to bring one to each dressing change visit.   Plan:  Return in 1 week for dressing change    Alyce Pagan RN VAD Coordinator  Office: [phone]  24/7 Pager: [phone]

## 2020-12-21 NOTE — Progress Notes (Signed)
LVAD INR 

## 2020-12-22 ENCOUNTER — Ambulatory Visit (HOSPITAL_COMMUNITY): Payer: Medicare Other

## 2020-12-22 ENCOUNTER — Encounter (HOSPITAL_COMMUNITY): Payer: Medicare Other

## 2020-12-23 ENCOUNTER — Other Ambulatory Visit (HOSPITAL_COMMUNITY): Payer: Self-pay | Admitting: *Deleted

## 2020-12-23 DIAGNOSIS — Z95811 Presence of heart assist device: Secondary | ICD-10-CM

## 2020-12-23 DIAGNOSIS — Z7901 Long term (current) use of anticoagulants: Secondary | ICD-10-CM

## 2020-12-28 ENCOUNTER — Other Ambulatory Visit (HOSPITAL_COMMUNITY): Payer: Medicare Other

## 2020-12-29 ENCOUNTER — Other Ambulatory Visit (HOSPITAL_COMMUNITY): Payer: Self-pay | Admitting: *Deleted

## 2020-12-29 DIAGNOSIS — Z7901 Long term (current) use of anticoagulants: Secondary | ICD-10-CM

## 2020-12-29 DIAGNOSIS — Z95811 Presence of heart assist device: Secondary | ICD-10-CM

## 2020-12-30 ENCOUNTER — Ambulatory Visit (HOSPITAL_COMMUNITY): Admission: RE | Admit: 2020-12-30 | Payer: Medicare Other | Source: Ambulatory Visit

## 2020-12-30 ENCOUNTER — Encounter (HOSPITAL_COMMUNITY): Payer: Medicare Other

## 2020-12-31 ENCOUNTER — Other Ambulatory Visit (HOSPITAL_COMMUNITY): Payer: Self-pay | Admitting: Unknown Physician Specialty

## 2020-12-31 DIAGNOSIS — Z95811 Presence of heart assist device: Secondary | ICD-10-CM

## 2020-12-31 MED ORDER — ENTRESTO 49-51 MG PO TABS: 1.0000 | ORAL_TABLET | Freq: Two times a day (BID) | ORAL | 6 refills | Status: DC

## 2021-01-04 ENCOUNTER — Other Ambulatory Visit: Payer: Self-pay

## 2021-01-04 ENCOUNTER — Ambulatory Visit (HOSPITAL_COMMUNITY): Payer: Self-pay | Admitting: Pharmacist

## 2021-01-04 ENCOUNTER — Ambulatory Visit (HOSPITAL_COMMUNITY)
Admission: RE | Admit: 2021-01-04 | Discharge: 2021-01-04 | Disposition: A | Discharge status: Home / Self Care | Payer: Medicare Other | Source: Ambulatory Visit | Attending: Internal Medicine | Admitting: Internal Medicine

## 2021-01-04 DIAGNOSIS — Z95811 Presence of heart assist device: Secondary | ICD-10-CM

## 2021-01-04 DIAGNOSIS — Z7901 Long term (current) use of anticoagulants: Secondary | ICD-10-CM | POA: Diagnosis not present

## 2021-01-04 DIAGNOSIS — Z452 Encounter for adjustment and management of vascular access device: Secondary | ICD-10-CM | POA: Diagnosis not present

## 2021-01-04 LAB — CBC
HCT: 45.1 % (ref 39.0–52.0)
Hemoglobin: 14.9 g/dL (ref 13.0–17.0)
MCH: 28.5 pg (ref 26.0–34.0)
MCHC: 33 g/dL (ref 30.0–36.0)
MCV: 86.2 fL (ref 80.0–100.0)
Platelets: 238 10*3/uL (ref 150–400)
RBC: 5.23 MIL/uL (ref 4.22–5.81)
RDW: 15.4 % (ref 11.5–15.5)
WBC: 7.3 10*3/uL (ref 4.0–10.5)
nRBC: 0 % (ref 0.0–0.2)

## 2021-01-04 LAB — PROTIME-INR
INR: 2.4 — ABNORMAL HIGH (ref 0.8–1.2)
Prothrombin Time: 26.5 seconds — ABNORMAL HIGH (ref 11.4–15.2)

## 2021-01-04 NOTE — Progress Notes (Signed)
Patient presents to clinic today for INR and drive line exit wound care.   Existing VAD dressing removed and site care performed using sterile technique. Drive line exit site cleaned with Chlora prep applicators x 2, RINSED WITH SALINE, and allowed to dry. and Sorbaview dressing with Silverlon applied. Dressing covered with 1 large Tegaderm. Exit site healed and fully incorporated, the velour is fully implanted at exit site. No drainage, tenderness, foul odor or rash noted. Drive line anchor re-applied. Pt denies fever or chills. Pt provided with 4 weekly dressing kits for home use. Instructed to bring one to each dressing change visit.   Plan:  Return in 1 week for dressing change   [name] RN VAD Coordinator  Office: [phone]  24/7 Pager: [phone]

## 2021-01-04 NOTE — Progress Notes (Signed)
LVAD INR 

## 2021-01-07 ENCOUNTER — Other Ambulatory Visit (HOSPITAL_COMMUNITY): Payer: Self-pay

## 2021-01-07 ENCOUNTER — Telehealth (HOSPITAL_COMMUNITY): Payer: Self-pay | Admitting: Pharmacy Technician

## 2021-01-07 NOTE — Telephone Encounter (Signed)
Advanced Heart Failure Patient Advocate Encounter  Received a notice from [employer] that it is time to renew [place] assistance. Called patient, no way to leave a message.  Archer Asa, CPhT

## 2021-01-12 ENCOUNTER — Other Ambulatory Visit (HOSPITAL_COMMUNITY): Payer: Medicare Other

## 2021-01-18 ENCOUNTER — Other Ambulatory Visit: Payer: Self-pay | Admitting: Internal Medicine

## 2021-01-18 ENCOUNTER — Other Ambulatory Visit (HOSPITAL_COMMUNITY): Payer: Self-pay | Admitting: *Deleted

## 2021-01-18 DIAGNOSIS — Z95811 Presence of heart assist device: Secondary | ICD-10-CM

## 2021-01-18 DIAGNOSIS — I5022 Chronic systolic (congestive) heart failure: Secondary | ICD-10-CM

## 2021-01-18 DIAGNOSIS — Z7901 Long term (current) use of anticoagulants: Secondary | ICD-10-CM

## 2021-01-18 DIAGNOSIS — N183 Chronic kidney disease, stage 3 unspecified: Secondary | ICD-10-CM

## 2021-01-18 MED ORDER — PANTOPRAZOLE SODIUM 40 MG PO TBEC: 40.0000 mg | DELAYED_RELEASE_TABLET | Freq: Every day | ORAL | 3 refills | Status: DC

## 2021-01-19 ENCOUNTER — Ambulatory Visit (HOSPITAL_COMMUNITY)
Admission: RE | Admit: 2021-01-19 | Discharge: 2021-01-19 | Disposition: A | Discharge status: Home / Self Care | Payer: Medicare Other | Source: Ambulatory Visit | Attending: Cardiology | Admitting: Cardiology

## 2021-01-19 ENCOUNTER — Other Ambulatory Visit: Payer: Self-pay

## 2021-01-19 ENCOUNTER — Ambulatory Visit (HOSPITAL_COMMUNITY): Payer: Self-pay | Admitting: Pharmacist

## 2021-01-19 DIAGNOSIS — Z7901 Long term (current) use of anticoagulants: Secondary | ICD-10-CM | POA: Diagnosis not present

## 2021-01-19 DIAGNOSIS — Z4801 Encounter for change or removal of surgical wound dressing: Secondary | ICD-10-CM | POA: Insufficient documentation

## 2021-01-19 DIAGNOSIS — Z95811 Presence of heart assist device: Secondary | ICD-10-CM | POA: Insufficient documentation

## 2021-01-19 DIAGNOSIS — Z5181 Encounter for therapeutic drug level monitoring: Secondary | ICD-10-CM | POA: Insufficient documentation

## 2021-01-19 DIAGNOSIS — Z5982 Transportation insecurity: Secondary | ICD-10-CM | POA: Insufficient documentation

## 2021-01-19 LAB — PROTIME-INR
INR: 2.4 — ABNORMAL HIGH (ref 0.8–1.2)
Prothrombin Time: 25.9 seconds — ABNORMAL HIGH (ref 11.4–15.2)

## 2021-01-19 NOTE — Progress Notes (Signed)
CSW set patient up for [employer] through the [employer]. Patient's transport needs for January 11 appointment have been schueld and patient has signed waiver. CSW available as needed. Lasandra Beech, LCSW, CCSW-MCS [phone]
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
## 2021-01-19 NOTE — Addendum Note (Signed)
Encounter addended by: Bernita Raisin, RN on: 01/19/2021 10:06 AM  Actions taken: Clinical Note Signed

## 2021-01-19 NOTE — Addendum Note (Signed)
Encounter addended by: Marcy Siren, LCSW on: 01/19/2021 10:09 AM  Actions taken: Clinical Note Signed

## 2021-01-19 NOTE — Progress Notes (Signed)
LVAD INR 

## 2021-01-19 NOTE — Addendum Note (Signed)
Encounter addended by: Kingston Guiles B, RN on: 01/19/2021 10:06 AM ° Actions taken: Clinical Note Signed

## 2021-01-26 ENCOUNTER — Other Ambulatory Visit (HOSPITAL_COMMUNITY): Payer: Medicare Other

## 2021-01-26 ENCOUNTER — Other Ambulatory Visit (HOSPITAL_COMMUNITY): Payer: Self-pay | Admitting: *Deleted

## 2021-01-26 DIAGNOSIS — Z95811 Presence of heart assist device: Secondary | ICD-10-CM

## 2021-01-26 DIAGNOSIS — Z7901 Long term (current) use of anticoagulants: Secondary | ICD-10-CM

## 2021-02-02 ENCOUNTER — Other Ambulatory Visit (HOSPITAL_COMMUNITY): Payer: Medicare Other

## 2021-02-08 ENCOUNTER — Other Ambulatory Visit (HOSPITAL_COMMUNITY): Payer: Self-pay | Admitting: *Deleted

## 2021-02-08 DIAGNOSIS — Z7901 Long term (current) use of anticoagulants: Secondary | ICD-10-CM

## 2021-02-08 DIAGNOSIS — Z95811 Presence of heart assist device: Secondary | ICD-10-CM

## 2021-02-09 ENCOUNTER — Encounter (HOSPITAL_COMMUNITY): Payer: Self-pay

## 2021-02-09 ENCOUNTER — Ambulatory Visit (HOSPITAL_BASED_OUTPATIENT_CLINIC_OR_DEPARTMENT_OTHER)
Admission: RE | Admit: 2021-02-09 | Discharge: 2021-02-09 | Disposition: A | Discharge status: Home / Self Care | Payer: Medicare Other | Source: Ambulatory Visit | Attending: Cardiology | Admitting: Cardiology

## 2021-02-09 ENCOUNTER — Ambulatory Visit (HOSPITAL_COMMUNITY)
Admission: RE | Admit: 2021-02-09 | Discharge: 2021-02-09 | Disposition: A | Discharge status: Home / Self Care | Payer: Medicare Other | Source: Ambulatory Visit | Attending: Internal Medicine | Admitting: Internal Medicine

## 2021-02-09 ENCOUNTER — Other Ambulatory Visit: Payer: Self-pay

## 2021-02-09 VITALS — BP 111/73 | HR 87 | Temp 98.3°F | Ht 72.0 in | Wt 260.0 lb

## 2021-02-09 DIAGNOSIS — I4891 Unspecified atrial fibrillation: Secondary | ICD-10-CM | POA: Diagnosis not present

## 2021-02-09 DIAGNOSIS — I5040 Unspecified combined systolic (congestive) and diastolic (congestive) heart failure: Secondary | ICD-10-CM | POA: Diagnosis not present

## 2021-02-09 DIAGNOSIS — I10 Essential (primary) hypertension: Secondary | ICD-10-CM

## 2021-02-09 DIAGNOSIS — E1169 Type 2 diabetes mellitus with other specified complication: Secondary | ICD-10-CM

## 2021-02-09 DIAGNOSIS — Z95811 Presence of heart assist device: Secondary | ICD-10-CM | POA: Diagnosis not present

## 2021-02-09 DIAGNOSIS — Z7901 Long term (current) use of anticoagulants: Secondary | ICD-10-CM

## 2021-02-09 DIAGNOSIS — E119 Type 2 diabetes mellitus without complications: Secondary | ICD-10-CM | POA: Diagnosis not present

## 2021-02-09 DIAGNOSIS — I5022 Chronic systolic (congestive) heart failure: Secondary | ICD-10-CM | POA: Diagnosis not present

## 2021-02-09 DIAGNOSIS — I480 Paroxysmal atrial fibrillation: Secondary | ICD-10-CM

## 2021-02-09 LAB — CBC
HCT: 40.3 % (ref 39.0–52.0)
Hemoglobin: 13.3 g/dL (ref 13.0–17.0)
MCH: 28.4 pg (ref 26.0–34.0)
MCHC: 33 g/dL (ref 30.0–36.0)
MCV: 85.9 fL (ref 80.0–100.0)
Platelets: 229 10*3/uL (ref 150–400)
RBC: 4.69 MIL/uL (ref 4.22–5.81)
RDW: 15.9 % — ABNORMAL HIGH (ref 11.5–15.5)
WBC: 7.8 10*3/uL (ref 4.0–10.5)
nRBC: 0 % (ref 0.0–0.2)

## 2021-02-09 LAB — ECHOCARDIOGRAM COMPLETE
Area-P 1/2: 6.32 cm2
S' Lateral: 4.2 cm

## 2021-02-09 NOTE — Patient Instructions (Signed)
No change in medications. Return in one week for dressing change. Return for 2 mo f/u in VAD Clinic along with 4 year Intermacs and annual maintenance. Bring home equipment for annual maintenance. Be prepared to perform 6 minute walk.

## 2021-02-09 NOTE — Progress Notes (Addendum)
Patient presents for 2 month follow up in [facility] today alone. Reports no problems with VAD equipment or concerns with drive line.   Complete echo performed prior to this appointment. Reviewed by Dr. Haroldine Laws and results reviewed with patient. No changes recommended.   Pt brought letter from [employer] requesting re-enrollment for Patient Assistance Program; forwarded to [facility], CPhT.  Patient says he has adequate supply for now, will contact us if he needs samples.   Patient denies any issues. He says he was walking daily before Christmas but has stopped since Christmas. Plans on getting back into an exercise regime.   He reports he is checking blood sugars at home occasionally and have been running between 100 - 130.   BP elevated today, pt says he has been eating more pork recently and plans on stopping.   Labs drawn today; all hemolyzed except CBC. Dr. Aundra Dubin updated, will draw next appointment.   Vital Signs:   Temp: 98.3      Doppler Pressure: 90 Automatc BP:  111/73 (96)   HR: 787   SPO2: 98% on RA        Weight: 260 lb w/ eqt  Last weight: 268.8  lb   VAD Indication: Destination Therapy - due to limited social support.    LVAD assessment:      Speed: 5300 RPM      Flow: 4.0       Power: 4.0w       PI:  7.9   Alarms: none  Events:  87 PI events today  Hct: 20       Fixed speed: 5300 Low speed limit: 5000  Primary Controller:  Replace back up battery in 17 months.  Back up controller:  Replace back up battery in 11 months.  I reviewed the LVAD parameters from today and compared the results to the patient's prior recorded data. LVAD interrogation was STABLE for significant power changes, NEGATIVE for clinical alarms and STABLE for PI events/speed drops. No programming changes were made and pump is functioning within specified parameters.    LVAD equipment check completed and is in good working order. Back-up equipment present.   Annual maintenance completed on  patient's home equipment per [facility] 05/26/20.  Exit Site Care: Gene is doing weekly dressing change weekly. Existing VAD dressing removed and site care performed using sterile technique. Drive line exit site cleaned with Chlora prep applicators x 2, allowed to dry, and biopatch w/ Sorbaview dressing applied. Exit site healed and partially incorporated, the velour is fully implanted at exit site. No drainage, redness, tenderness, foul odor or rash noted. Drive line anchor re-applied. Pt denies fever or chills.   Device:  N/A   BP & Labs:  Doppler 90 - is reflecting MAP   Hgb 14.9 - No S/S of bleeding. Specifically denies melena/BRBPR or nosebleeds.   LDH hemolyzed - established baseline of 200 - 400. Denies tea-colored urine. No power elevations noted on interrogation other than above noted incidents.   Patient Instructions:  No change in medications. Return in one week for dressing change. Return for 2 mo f/u in [facility] along with 4 year Intermacs and annual maintenance. Bring home equipment for annual maintenance. Be prepared to perform 6 minute walk.    Zada Girt RN [place] Coordinator  Office: [phone]  24/7 Pager: [phone]

## 2021-02-09 NOTE — Progress Notes (Signed)
Patient requested to see CSW regarding questions on his disability. Patient stated he applied for social security benefits in June, 2022 and has not heard anything. CSW explored and informed patient that it was the extra help program he applied for and due to his income he is not eligible. CSW shared patient already receiving Social Security disability benefits and has Medicare. Patient verbalizes understanding and denies any other needs at this time. Lasandra Beech, LCSW, CCSW-MCS [phone]

## 2021-02-09 NOTE — Progress Notes (Signed)
°  Echocardiogram 2D Echocardiogram has been performed.  Janalyn Harder 02/09/2021, 8:42 AM

## 2021-02-10 ENCOUNTER — Telehealth (HOSPITAL_COMMUNITY): Payer: Self-pay | Admitting: Pharmacy Technician

## 2021-02-10 ENCOUNTER — Other Ambulatory Visit (HOSPITAL_COMMUNITY): Payer: Self-pay

## 2021-02-10 NOTE — Telephone Encounter (Signed)
Advanced Heart Failure Patient Advocate Encounter  Received a notice from [employer] that it is time to renew [place] assistance.Attempted to call patient, no vm was set up to be able to leave a message.

## 2021-02-11 ENCOUNTER — Other Ambulatory Visit (HOSPITAL_COMMUNITY): Payer: Self-pay

## 2021-02-11 ENCOUNTER — Other Ambulatory Visit (HOSPITAL_COMMUNITY): Payer: Self-pay | Admitting: *Deleted

## 2021-02-11 DIAGNOSIS — I5022 Chronic systolic (congestive) heart failure: Secondary | ICD-10-CM

## 2021-02-11 DIAGNOSIS — Z95811 Presence of heart assist device: Secondary | ICD-10-CM

## 2021-02-11 DIAGNOSIS — Z7901 Long term (current) use of anticoagulants: Secondary | ICD-10-CM

## 2021-02-11 MED ORDER — ENTRESTO 49-51 MG PO TABS: 1.0000 | ORAL_TABLET | Freq: Two times a day (BID) | ORAL | 3 refills | Status: DC

## 2021-02-11 NOTE — Telephone Encounter (Signed)
Advanced Heart Failure Patient Advocate Encounter  Patient was approved for a PAN HF grant that will help cover the cost of Entresto. He is aware that we will not seek assistance at this time and will come back to it if the grant is depleted and the co-pay is unaffordable.  Member ID: [id number] Group ID: [id number] RxBin ID: [id number] PCN: [id number] Eligibility Start Date: 11/13/2020 Eligibility End Date: 02/10/2022 Assistance Amount: $1,200.00  Sent patient email with grant information. Sent 90 day RX request to [place] (CMA) to send to [employer].   Archer Asa, CPhT

## 2021-02-13 NOTE — Progress Notes (Signed)
LVAD Clinic Note   Primary Cardiologist: Dr. Haroldine Laws   HPI: Benjamin Sherman is a 51 y.o. 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 ECHO showed severely reduced EF. 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 Placed on steroids without benefit.  Admitted 11/18 after fall/syncopal episode resulting in bifrontal SAH. Neurosurgery consulted.   Admitted 4/19 with cardiogenic shock in setting of AFL. Supported with inotropes and underwent DC-CV but had persistent shock. After numerous discussions about concern for adequate social support underwent placement of HM-3 LVAD on 05/15/17. D/c home 06/04/17  Here for regular f/u. Says he continues to do very well. Has not been as active over the winter months due to the clod. Not walking routinely or playing as much golf. Also reports that he is not watching his diet very well. Eating a lot of pork and other salty foods. Denies orthopnea or PND. No fevers, chills or problems with driveline. No bleeding, melena or neuro symptoms. No VAD alarms. Taking all meds as prescribed.     VAD Indication: Destination Therapy - due to limited social support.    LVAD assessment:                                                    Speed: 5300 RPM                                                       Flow: 4.0                                                                      Power: 4.0w                                PI:  7.9              Alarms: none  Events:  87 PI events today  Hct: 20                                                              Fixed speed: 5300 Low speed limit: 5000   Primary Controller:  Replace back up battery in 17 months.  Back up controller:  Replace back up battery in 11 months.   I reviewed the LVAD parameters from today and compared the results to the patient's  prior recorded data. LVAD interrogation was STABLE for significant power changes, NEGATIVE for clinical alarms and STABLE for PI events/speed drops. No programming changes were made and pump is functioning within specified parameters.  LVAD equipment check completed and is in good working order. Back-up equipment present.    Annual maintenance completed on patient's home equipment per [facility] 05/26/20.    Past Medical History:  Diagnosis Date   CHF (congestive heart failure) (HCC)    Diabetes mellitus without complication (HCC)     Current Outpatient Medications  Medication Sig Dispense Refill   amLODipine (NORVASC) 5 MG tablet TAKE (2) TABLETS BY MOUTH DAILY. 180 tablet 3   aspirin 81 MG EC tablet Take 1 tablet (81 mg total) by mouth daily. 90 tablet 3   Blood Glucose Monitoring Suppl (TRUE METRIX METER) DEVI 1 each by Does not apply route daily with breakfast. 1 Device 0   glipiZIDE (GLUCOTROL) 5 MG tablet Take 2 with breakfast and 1 with dinner 270 tablet 3   glucose blood (TRUE METRIX BLOOD GLUCOSE TEST) test strip Use daily before breakfast 30 each 12   pantoprazole (PROTONIX) 40 MG tablet Take 1 tablet (40 mg total) by mouth daily. 90 tablet 3   TRUEPLUS LANCETS 28G MISC 1 each by Does not apply route daily. 30 each 12   warfarin (COUMADIN) 6 MG tablet TAKE 1/2 tablet (3 mg) Tues/Thurs and 1 tablet (6mg ) all other days; or as directed by HF clinic 90 tablet 5   enoxaparin (LOVENOX) 40 MG/0.4ML injection Inject 0.4 mLs (40 mg total) into the skin every 12 (twelve) hours. (Patient not taking: Reported on 09/01/2019) 4 mL 0   sacubitril-valsartan (ENTRESTO) 49-51 MG Take 1 tablet by mouth 2 (two) times daily. 180 tablet 3   No current facility-administered medications for this encounter.   Allergies  Allergen Reactions   Bee Venom     UNSPECIFIED REACTION    Social History   Socioeconomic History   Marital status: Single    Spouse name: Not on file   Number of children: Not on  file   Years of education: Not on file   Highest education level: Not on file  Occupational History   Occupation: security  Tobacco Use   Smoking status: Former    Types: Cigars    Quit date: 11/16/2016    Years since quitting: 4.2   Smokeless tobacco: Never   Tobacco comments:    6 cigars per wk  Vaping Use   Vaping Use: Never used  Substance and Sexual Activity   Alcohol use: Yes    Alcohol/week: 4.0 standard drinks    Types: 2 Shots of liquor, 2 Cans of beer per week    Comment: ocasional   Drug use: No   Sexual activity: Not Currently  Other Topics Concern   Not on file  Social History Narrative   Not on file   Social Determinants of Health   Financial Resource Strain: Low Risk    Difficulty of Paying Living Expenses: Not very hard  Food Insecurity: No Food Insecurity   Worried About Programme researcher, broadcasting/film/video in the Last Year: Never true   Ran Out of Food in the Last Year: Never true  Transportation Needs: No Transportation Needs   Lack of Transportation (Medical): No   Lack of Transportation (Non-Medical): No  Physical Activity: Not on file  Stress: Not on file  Social Connections: Not on file  Intimate Partner Violence: Not on file    Family History  Problem Relation Age of Onset   CAD Father    Vitals:   02/09/21 0910 02/09/21 0911  BP: (!) 90/0 111/73  Pulse:  87  Temp:  98.3 F (36.8 C)  TempSrc:  Oral  SpO2:  98%  Weight:  117.9 kg (260 lb)  Height:  6' (1.829 m)    Wt Readings from Last 3 Encounters:  02/09/21 117.9 kg (260 lb)  10/19/20 121.9 kg (268 lb 12.8 oz)  08/02/20 119.5 kg (263 lb 6.4 oz)      Vital Signs:     Temp: 98.3                                                       Doppler Pressure: 90 Automatc BP:  111/73 (96)                  HR: 787                        SPO2: 98% on RA                                                         Weight: 260 lb w/ eqt  Last weight: 268.8  lb    Physical Exam: General:  NAD.  HEENT:  normal  Neck: supple. JVP not elevated.  Carotids 2+ bilat; no bruits. No lymphadenopathy or thryomegaly appreciated. Cor: LVAD hum.  Lungs: Clear. Abdomen: obese soft, nontender, non-distended. No hepatosplenomegaly. No bruits or masses. Good bowel sounds. Driveline site clean. Anchor in place.  Extremities: no cyanosis, clubbing, rash. Warm no edema  Neuro: alert & oriented x 3. No focal deficits. Moves all 4 without problem     ASSESSMENT & PLAN:  1. Chronic Combined Systolic/Diastolic Heart Failure. NICM  - ECHO 4/19 EF 20% CMRI EF 22%  - s/p HM-3 LVAD on 05/15/17 - Doing very well with VAD support. Remains NYHA I  - Volume status looks good despite high salt intake   2. VAD management.  - VAD interrogated personally. Parameters stable. - Continues with frequent PI events but no low flows. No change,  - LDH unable to get blood today - Remains on ASA 81 mg daily + coumadin. - INR unable to get blood today,. Recent check was 2.4  INR goal 2.0-2.5  Discussed dosing with PharmD personally. - DL site looks good - MAPs up slightly. Watch salt intake. Can adjust meds as needed  3. 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 Off amio  - Continue coumadin  4. HTN - MAPs up slightly. Watch salt intake. Can adjust meds as needed - Continue Entresto at 49/51.  5. DMII - Per PCP - Doubt he will tolerate SGLT2i with volume issues.   7. CKD 3a - Creatinine baseline 1.5-1.8 - Unable to get blood today.  - has been stable  Total time spent 35 minutes. Over half that time spent discussing above.     Glori Bickers, MD 02/13/21

## 2021-02-18 ENCOUNTER — Ambulatory Visit (HOSPITAL_COMMUNITY): Payer: Self-pay | Admitting: Pharmacist

## 2021-02-18 ENCOUNTER — Ambulatory Visit (HOSPITAL_COMMUNITY)
Admission: RE | Admit: 2021-02-18 | Discharge: 2021-02-18 | Disposition: A | Discharge status: Home / Self Care | Payer: Medicare Other | Source: Ambulatory Visit | Attending: Cardiology | Admitting: Cardiology

## 2021-02-18 ENCOUNTER — Other Ambulatory Visit: Payer: Self-pay

## 2021-02-18 DIAGNOSIS — I5022 Chronic systolic (congestive) heart failure: Secondary | ICD-10-CM | POA: Insufficient documentation

## 2021-02-18 DIAGNOSIS — Z4801 Encounter for change or removal of surgical wound dressing: Secondary | ICD-10-CM | POA: Diagnosis present

## 2021-02-18 DIAGNOSIS — Z7901 Long term (current) use of anticoagulants: Secondary | ICD-10-CM | POA: Insufficient documentation

## 2021-02-18 DIAGNOSIS — Z95811 Presence of heart assist device: Secondary | ICD-10-CM | POA: Insufficient documentation

## 2021-02-18 LAB — LACTATE DEHYDROGENASE: LDH: 436 U/L — ABNORMAL HIGH (ref 98–192)

## 2021-02-18 LAB — BASIC METABOLIC PANEL
Anion gap: 10 (ref 5–15)
BUN: 19 mg/dL (ref 6–20)
CO2: 25 mmol/L (ref 22–32)
Calcium: 9.5 mg/dL (ref 8.9–10.3)
Chloride: 97 mmol/L — ABNORMAL LOW (ref 98–111)
Creatinine, Ser: 1.47 mg/dL — ABNORMAL HIGH (ref 0.61–1.24)
GFR, Estimated: 57 mL/min — ABNORMAL LOW (ref >60)
Glucose, Bld: 438 mg/dL — ABNORMAL HIGH (ref 70–99)
Potassium: 5.5 mmol/L — ABNORMAL HIGH (ref 3.5–5.1)
Sodium: 132 mmol/L — ABNORMAL LOW (ref 135–145)

## 2021-02-18 LAB — PROTIME-INR
INR: 2.6 — ABNORMAL HIGH (ref 0.8–1.2)
Prothrombin Time: 27.9 seconds — ABNORMAL HIGH (ref 11.4–15.2)

## 2021-02-18 NOTE — Progress Notes (Signed)
Patient presents to clinic today for INR and drive line exit wound care.   Existing VAD dressing removed and site care performed using sterile technique. Drive line exit site cleaned with Chlora prep applicators x 2, RINSED WITH SALINE, and allowed to dry. and Sorbaview dressing with Silverlon applied. Dressing covered with 1 large Tegaderm. Exit site healed and fully incorporated, the velour is fully implanted at exit site. No drainage, tenderness, foul odor or rash noted. Drive line anchor re-applied. Pt denies fever or chills. Pt provided with 4 weekly dressing kits for home use. Instructed to bring one to each dressing change visit.   Plan:  Return in 1 week for dressing change  Lauren (PharmD) will contact you with INR results and warfarin dosing.   Zada Girt RN [employer] Coordinator  Office: [phone]  24/7 Pager: [phone]

## 2021-02-18 NOTE — Progress Notes (Signed)
LVAD INR 

## 2021-02-25 ENCOUNTER — Other Ambulatory Visit (HOSPITAL_COMMUNITY): Payer: Self-pay | Admitting: Unknown Physician Specialty

## 2021-02-25 ENCOUNTER — Other Ambulatory Visit (HOSPITAL_COMMUNITY): Payer: Medicare Other

## 2021-02-25 DIAGNOSIS — Z7901 Long term (current) use of anticoagulants: Secondary | ICD-10-CM

## 2021-02-25 DIAGNOSIS — Z95811 Presence of heart assist device: Secondary | ICD-10-CM

## 2021-03-03 ENCOUNTER — Ambulatory Visit (HOSPITAL_COMMUNITY): Payer: Self-pay | Admitting: Pharmacist

## 2021-03-03 ENCOUNTER — Other Ambulatory Visit: Payer: Self-pay

## 2021-03-03 ENCOUNTER — Ambulatory Visit (HOSPITAL_COMMUNITY)
Admission: RE | Admit: 2021-03-03 | Discharge: 2021-03-03 | Disposition: A | Discharge status: Home / Self Care | Payer: Medicare Other | Source: Ambulatory Visit | Attending: Cardiology | Admitting: Cardiology

## 2021-03-03 DIAGNOSIS — Z4800 Encounter for change or removal of nonsurgical wound dressing: Secondary | ICD-10-CM | POA: Diagnosis not present

## 2021-03-03 DIAGNOSIS — Z95811 Presence of heart assist device: Secondary | ICD-10-CM | POA: Diagnosis not present

## 2021-03-03 DIAGNOSIS — Z7901 Long term (current) use of anticoagulants: Secondary | ICD-10-CM | POA: Diagnosis not present

## 2021-03-03 LAB — PROTIME-INR
INR: 2.5 — ABNORMAL HIGH (ref 0.8–1.2)
Prothrombin Time: 27.3 seconds — ABNORMAL HIGH (ref 11.4–15.2)

## 2021-03-03 LAB — BASIC METABOLIC PANEL
Anion gap: 15 (ref 5–15)
BUN: 21 mg/dL — ABNORMAL HIGH (ref 6–20)
CO2: 20 mmol/L — ABNORMAL LOW (ref 22–32)
Calcium: 9.6 mg/dL (ref 8.9–10.3)
Chloride: 100 mmol/L (ref 98–111)
Creatinine, Ser: 1.48 mg/dL — ABNORMAL HIGH (ref 0.61–1.24)
GFR, Estimated: 57 mL/min — ABNORMAL LOW (ref >60)
Glucose, Bld: 293 mg/dL — ABNORMAL HIGH (ref 70–99)
Potassium: 6 mmol/L — ABNORMAL HIGH (ref 3.5–5.1)
Sodium: 135 mmol/L (ref 135–145)

## 2021-03-03 LAB — LACTATE DEHYDROGENASE: LDH: 592 U/L — ABNORMAL HIGH (ref 98–192)

## 2021-03-03 NOTE — Progress Notes (Signed)
Patient presents to clinic today for labs and drive line exit wound care.   Existing VAD dressing removed and site care performed using sterile technique. Drive line exit site cleaned with Chlora prep applicators x 2, RINSED WITH SALINE, and allowed to dry. and Sorbaview dressing with Silverlon applied. Exit site healed and fully incorporated, the velour is fully implanted at exit site. No drainage, tenderness, foul odor or rash noted. Drive line anchor re-applied. Pt denies fever or chills. Pt pt has sufficient kits at home.  Plan:  Return in 1 week for dressing change  Lauren (PharmD) will contact you with INR results and warfarin dosing.   Tanda Rockers RN [place] Coordinator  Office: [phone]  24/7 Pager: [phone]

## 2021-03-03 NOTE — Progress Notes (Signed)
LVAD INR 

## 2021-03-06 ENCOUNTER — Other Ambulatory Visit (HOSPITAL_COMMUNITY): Payer: Self-pay | Admitting: Unknown Physician Specialty

## 2021-03-06 DIAGNOSIS — Z7901 Long term (current) use of anticoagulants: Secondary | ICD-10-CM

## 2021-03-06 DIAGNOSIS — Z95811 Presence of heart assist device: Secondary | ICD-10-CM

## 2021-03-08 ENCOUNTER — Other Ambulatory Visit: Payer: Self-pay

## 2021-03-08 ENCOUNTER — Ambulatory Visit (HOSPITAL_COMMUNITY)
Admission: RE | Admit: 2021-03-08 | Discharge: 2021-03-08 | Disposition: A | Discharge status: Home / Self Care | Payer: Medicare Other | Source: Ambulatory Visit | Attending: Internal Medicine | Admitting: Internal Medicine

## 2021-03-08 ENCOUNTER — Ambulatory Visit (HOSPITAL_COMMUNITY): Payer: Self-pay | Admitting: Pharmacist

## 2021-03-08 DIAGNOSIS — Z4801 Encounter for change or removal of surgical wound dressing: Secondary | ICD-10-CM | POA: Diagnosis not present

## 2021-03-08 DIAGNOSIS — Z7901 Long term (current) use of anticoagulants: Secondary | ICD-10-CM

## 2021-03-08 DIAGNOSIS — Z95811 Presence of heart assist device: Secondary | ICD-10-CM | POA: Diagnosis not present

## 2021-03-08 LAB — CBC
HCT: 41.7 % (ref 39.0–52.0)
Hemoglobin: 14.2 g/dL (ref 13.0–17.0)
MCH: 29 pg (ref 26.0–34.0)
MCHC: 34.1 g/dL (ref 30.0–36.0)
MCV: 85.1 fL (ref 80.0–100.0)
Platelets: 253 10*3/uL (ref 150–400)
RBC: 4.9 MIL/uL (ref 4.22–5.81)
RDW: 16 % — ABNORMAL HIGH (ref 11.5–15.5)
WBC: 6.7 10*3/uL (ref 4.0–10.5)
nRBC: 0 % (ref 0.0–0.2)

## 2021-03-08 LAB — PROTIME-INR
INR: 2.5 — ABNORMAL HIGH (ref 0.8–1.2)
Prothrombin Time: 27.3 seconds — ABNORMAL HIGH (ref 11.4–15.2)

## 2021-03-08 LAB — LACTATE DEHYDROGENASE

## 2021-03-08 LAB — COMPREHENSIVE METABOLIC PANEL

## 2021-03-08 NOTE — Progress Notes (Signed)
LVAD INR 

## 2021-03-08 NOTE — Progress Notes (Signed)
Patient presents to clinic today for labs and drive line exit wound care alone. His "ride" is waiting in car. Reports no problems with VAD equipment or concerns with drive line.   Patient denies any dark urine, SOB, fatigue, or any other heart failure symptoms. VAD history interrogation reveals no power spikes, decreased # of PI events, and no low flows. Patient started walking since last visit 30 minutes daily with weight loss of 10 lbs. He plans on increasing walks to 45 minutes per day. Denies any limitations with daily activities.   Noted patient recently has had slow rise in his LDH with one set of labs that hemolyzed. Reviewed with Dr. Gala Sherman. Patient here today for repeat labs and review of VAD parameters. Dr. Gala Sherman in to assess patient and listen to pump. No issues identified.   Dr. Gala Sherman discussed if LDH continuing to rise, we will call him for probable hospital admission. Pt verbalized understanding of same. Patient also verbalized understanding to call if any of the following occur: dark urine, SOB, weight gain/swelling, abdominal distention, Low Flow alarms.   Vital Signs:    Doppler:  Automatc BP:  101/63 (74)                  HR: 87                      SPO2: 99% on RA                                                         Weight: 268 lb w/ eqt  Last weight: 258 lb    LVAD assessment:                                                    Speed: 5300 RPM                                                       Flow: 4.4                                                                      Power: 4.1w                                PI:  5.1          Alarms: none  Events:  38 today, 39 yesterday, 37+ on 03/06/21 Hct: 20  Fixed speed: 5300 Low speed limit: 5000  Exit Site Care: Existing VAD dressing removed and site care performed using sterile technique. Drive line exit site cleaned with Chlora prep applicators x  2, RINSED WITH SALINE, and allowed to dry. and Sorbaview dressing with Silverlon applied. Exit site healed and fully incorporated, the velour is fully implanted at exit site. No drainage, tenderness, foul odor or rash noted. Drive line anchor re-applied. Pt denies fever or chills. Pt pt has sufficient kits at home.    Plan:  Return in 1 week for dressing change and labs if indicated. Benjamin Sherman (PharmD) will contact you with INR results and warfarin dosing.  Call if any heart failure symptoms should occur. Call if any dark urine, power spikes/elevations, or low flow alarms occur.   Benjamin Diener RN, VAD Coordinator  Office: [phone]  24/7 Pager: [phone]

## 2021-03-09 ENCOUNTER — Other Ambulatory Visit (HOSPITAL_COMMUNITY): Payer: Self-pay | Admitting: Unknown Physician Specialty

## 2021-03-09 DIAGNOSIS — Z7901 Long term (current) use of anticoagulants: Secondary | ICD-10-CM

## 2021-03-09 DIAGNOSIS — Z95811 Presence of heart assist device: Secondary | ICD-10-CM

## 2021-03-10 ENCOUNTER — Telehealth (HOSPITAL_COMMUNITY): Payer: Self-pay | Admitting: Unknown Physician Specialty

## 2021-03-10 LAB — COMPREHENSIVE METABOLIC PANEL
ALT: 18 IU/L (ref 0–44)
AST: 22 IU/L (ref 0–40)
Albumin/Globulin Ratio: 1.2 (ref 1.2–2.2)
Albumin: 4 g/dL (ref 3.8–4.9)
Alkaline Phosphatase: 47 IU/L (ref 44–121)
BUN/Creatinine Ratio: 15 (ref 9–20)
BUN: 21 mg/dL (ref 6–24)
Bilirubin Total: 0.2 mg/dL (ref 0.0–1.2)
CO2: 23 mmol/L (ref 20–29)
Calcium: 9.4 mg/dL (ref 8.7–10.2)
Chloride: 99 mmol/L (ref 96–106)
Creatinine, Ser: 1.38 mg/dL — ABNORMAL HIGH (ref 0.76–1.27)
Globulin, Total: 3.4 g/dL (ref 1.5–4.5)
Glucose: 375 mg/dL — ABNORMAL HIGH (ref 70–99)
Potassium: 5.1 mmol/L (ref 3.5–5.2)
Sodium: 135 mmol/L (ref 134–144)
Total Protein: 7.4 g/dL (ref 6.0–8.5)
eGFR: 62 mL/min/{1.73_m2} (ref >59)

## 2021-03-10 LAB — LACTATE DEHYDROGENASE: LDH: 229 IU/L — ABNORMAL HIGH (ref 121–224)

## 2021-03-10 NOTE — Telephone Encounter (Signed)
I reached out to the pt today regarding labs drawn this morning. These labs were not hemolyzed as the previous labs that were drawn here at [facility]. LDH was within normal range for the pt at 229. Potassium was 5.1.  Pts BS is 375 today, I reviewed previous BS on his labs and note that most are high. Pt has not seen his PCP since January 2022 - this team was managing his DM. I instructed pt that he must reach out to them today so they can make changes to his medications to get his BS down. Pt assures me that he will call them and make an appt ASAP.   [name] RN, BSN VAD Coordinator 24/7 Pager [phone]

## 2021-03-11 ENCOUNTER — Other Ambulatory Visit (HOSPITAL_COMMUNITY): Payer: Medicare Other

## 2021-03-15 ENCOUNTER — Other Ambulatory Visit (HOSPITAL_COMMUNITY): Payer: Medicare Other

## 2021-03-17 ENCOUNTER — Other Ambulatory Visit (HOSPITAL_COMMUNITY): Payer: Self-pay | Admitting: *Deleted

## 2021-03-17 DIAGNOSIS — E1169 Type 2 diabetes mellitus with other specified complication: Secondary | ICD-10-CM

## 2021-03-17 DIAGNOSIS — Z95811 Presence of heart assist device: Secondary | ICD-10-CM

## 2021-03-17 DIAGNOSIS — Z7901 Long term (current) use of anticoagulants: Secondary | ICD-10-CM

## 2021-03-18 ENCOUNTER — Other Ambulatory Visit: Payer: Self-pay

## 2021-03-18 ENCOUNTER — Other Ambulatory Visit (HOSPITAL_COMMUNITY): Payer: Self-pay | Admitting: Internal Medicine

## 2021-03-18 ENCOUNTER — Ambulatory Visit (HOSPITAL_COMMUNITY)
Admission: RE | Admit: 2021-03-18 | Discharge: 2021-03-18 | Disposition: A | Discharge status: Home / Self Care | Payer: Medicare Other | Source: Ambulatory Visit | Attending: Internal Medicine | Admitting: Internal Medicine

## 2021-03-18 DIAGNOSIS — Z4801 Encounter for change or removal of surgical wound dressing: Secondary | ICD-10-CM | POA: Insufficient documentation

## 2021-03-18 NOTE — Progress Notes (Signed)
Patient presents to clinic today drive line exit wound care alone.  Reports no problems with VAD equipment or concerns with drive line.  ? ?Pt reports he tried to get Entresto filled and was told he needed to "re-apply" for assistance. Gave him Kathlee Nations (PharD) direct # and asked him to call her. She has been working on this, but I told patient he doesn't answer phone, and she may have been trying to reach him. Told him to call and speak with her directly. He verbalized agreement to same. ? ?Samples given: Ehtresto 49/51 x 2 bottles (28 tabs each bottle); LN [id number] with expiration date 1/20. ? ?Exit Site Care: Existing VAD dressing removed and site care performed using sterile technique. Drive line exit site cleaned with Chlora prep applicators x 2, RINSED WITH SALINE, and allowed to dry. and Sorbaview dressing with Silverlon applied. Exit site healed and fully incorporated, the velour is fully implanted at exit site. No drainage, tenderness, foul odor or rash noted. Drive line anchor re-applied. Pt denies fever or chills. Provided patient with 3 additional weekly dressing kits and box of large Tegaderm (uses to cover prior to showers). Asked him to bring kit to each dressing change visit.  ?  ? ?Plan:  ?Return in 1 week for dressing change and labs.  ? ? ?Zada Girt RN, VAD Coordinator  ?Office: [phone]  ?24/7 Pager: [phone]  ? ? ? ? ? ?

## 2021-03-21 ENCOUNTER — Other Ambulatory Visit (HOSPITAL_COMMUNITY): Payer: Self-pay

## 2021-03-25 ENCOUNTER — Other Ambulatory Visit (HOSPITAL_COMMUNITY): Payer: Medicare Other

## 2021-03-25 ENCOUNTER — Encounter (HOSPITAL_COMMUNITY): Payer: Self-pay | Admitting: *Deleted

## 2021-04-07 ENCOUNTER — Ambulatory Visit (HOSPITAL_COMMUNITY): Payer: Self-pay | Admitting: Pharmacist

## 2021-04-07 ENCOUNTER — Other Ambulatory Visit: Payer: Self-pay

## 2021-04-07 ENCOUNTER — Ambulatory Visit (HOSPITAL_COMMUNITY)
Admission: RE | Admit: 2021-04-07 | Discharge: 2021-04-07 | Disposition: A | Discharge status: Home / Self Care | Payer: Medicare Other | Source: Ambulatory Visit | Attending: Adult Health | Admitting: Adult Health

## 2021-04-07 DIAGNOSIS — Z4801 Encounter for change or removal of surgical wound dressing: Secondary | ICD-10-CM | POA: Insufficient documentation

## 2021-04-07 DIAGNOSIS — Z95811 Presence of heart assist device: Secondary | ICD-10-CM | POA: Diagnosis not present

## 2021-04-07 DIAGNOSIS — E1169 Type 2 diabetes mellitus with other specified complication: Secondary | ICD-10-CM | POA: Diagnosis not present

## 2021-04-07 DIAGNOSIS — Z7901 Long term (current) use of anticoagulants: Secondary | ICD-10-CM | POA: Diagnosis not present

## 2021-04-07 LAB — HEMOGLOBIN A1C
Hgb A1c MFr Bld: 10.6 % — ABNORMAL HIGH (ref 4.8–5.6)
Mean Plasma Glucose: 257.52 mg/dL

## 2021-04-07 LAB — PROTIME-INR
INR: 2 — ABNORMAL HIGH (ref 0.8–1.2)
Prothrombin Time: 22.8 seconds — ABNORMAL HIGH (ref 11.4–15.2)

## 2021-04-07 NOTE — Progress Notes (Addendum)
Patient presents to clinic today drive line exit wound care alone.  Reports no problems with VAD equipment or concerns with drive line.  ? ?Pt tells me today that he has not scheduled an appt with his PCP to manage his DM. Pts A1C is 10.6 today. Pt was informed that he must make an appt with them ASAP. Pt understands this situation and assures me will make an appt with them today.  ? ?Exit Site Care: Existing VAD dressing removed and site care performed using sterile technique. Drive line exit site cleaned with Chlora prep applicators x 2, RINSED WITH SALINE, and allowed to dry. and Sorbaview dressing with Silverlon applied. Exit site healed and fully incorporated, the velour is fully implanted at exit site. No drainage, tenderness, foul odor or rash noted. Drive line anchor re-applied. Pt denies fever or chills. Pt has sufficient kits at home. ?  ? ?Plan:  ?Return in 1 week for dressing change  ? ?Tanda Rockers RN, VAD Coordinator  ?Office: [phone]  ?24/7 Pager: [phone]  ? ? ? ? ? ?

## 2021-04-07 NOTE — Addendum Note (Signed)
Encounter addended by: Christinia Gully, RN on: 04/07/2021 11:58 AM ? Actions taken: Clinical Note Signed

## 2021-04-07 NOTE — Progress Notes (Signed)
LVAD INR 

## 2021-04-08 ENCOUNTER — Encounter (HOSPITAL_COMMUNITY): Payer: Medicare Other

## 2021-04-15 ENCOUNTER — Other Ambulatory Visit (HOSPITAL_COMMUNITY): Payer: Self-pay | Admitting: Unknown Physician Specialty

## 2021-04-15 ENCOUNTER — Other Ambulatory Visit (HOSPITAL_COMMUNITY): Payer: Medicare Other

## 2021-04-15 ENCOUNTER — Other Ambulatory Visit (HOSPITAL_COMMUNITY): Payer: Self-pay | Admitting: *Deleted

## 2021-04-15 DIAGNOSIS — I10 Essential (primary) hypertension: Secondary | ICD-10-CM

## 2021-04-15 DIAGNOSIS — Z95811 Presence of heart assist device: Secondary | ICD-10-CM

## 2021-04-15 DIAGNOSIS — Z7901 Long term (current) use of anticoagulants: Secondary | ICD-10-CM

## 2021-04-15 DIAGNOSIS — I5022 Chronic systolic (congestive) heart failure: Secondary | ICD-10-CM

## 2021-04-15 MED ORDER — ENTRESTO 49-51 MG PO TABS: 1.0000 | ORAL_TABLET | Freq: Two times a day (BID) | ORAL | 3 refills | Status: DC

## 2021-04-19 ENCOUNTER — Ambulatory Visit (HOSPITAL_COMMUNITY)
Admission: RE | Admit: 2021-04-19 | Discharge: 2021-04-19 | Disposition: A | Discharge status: Home / Self Care | Payer: Medicare Other | Source: Ambulatory Visit | Attending: Cardiology | Admitting: Cardiology

## 2021-04-19 ENCOUNTER — Ambulatory Visit (HOSPITAL_COMMUNITY): Payer: Self-pay | Admitting: Pharmacist

## 2021-04-19 DIAGNOSIS — I130 Hypertensive heart and chronic kidney disease with heart failure and stage 1 through stage 4 chronic kidney disease, or unspecified chronic kidney disease: Secondary | ICD-10-CM | POA: Diagnosis not present

## 2021-04-19 DIAGNOSIS — Z79899 Other long term (current) drug therapy: Secondary | ICD-10-CM | POA: Diagnosis not present

## 2021-04-19 DIAGNOSIS — N183 Chronic kidney disease, stage 3 unspecified: Secondary | ICD-10-CM | POA: Diagnosis not present

## 2021-04-19 DIAGNOSIS — Z7901 Long term (current) use of anticoagulants: Secondary | ICD-10-CM | POA: Insufficient documentation

## 2021-04-19 DIAGNOSIS — Z4801 Encounter for change or removal of surgical wound dressing: Secondary | ICD-10-CM | POA: Insufficient documentation

## 2021-04-19 DIAGNOSIS — I10 Essential (primary) hypertension: Secondary | ICD-10-CM

## 2021-04-19 DIAGNOSIS — E1122 Type 2 diabetes mellitus with diabetic chronic kidney disease: Secondary | ICD-10-CM | POA: Diagnosis not present

## 2021-04-19 DIAGNOSIS — I5022 Chronic systolic (congestive) heart failure: Secondary | ICD-10-CM | POA: Diagnosis not present

## 2021-04-19 DIAGNOSIS — Z95811 Presence of heart assist device: Secondary | ICD-10-CM | POA: Diagnosis not present

## 2021-04-19 LAB — PROTIME-INR
INR: 2.5 — ABNORMAL HIGH (ref 0.8–1.2)
Prothrombin Time: 27 seconds — ABNORMAL HIGH (ref 11.4–15.2)

## 2021-04-19 MED ORDER — METFORMIN HCL 1000 MG PO TABS: 1000.0000 mg | ORAL_TABLET | Freq: Two times a day (BID) | ORAL | 6 refills | Status: DC

## 2021-04-19 MED ORDER — AMLODIPINE BESYLATE 5 MG PO TABS: 10.0000 mg | ORAL_TABLET | Freq: Every day | ORAL | 3 refills | Status: DC

## 2021-04-19 MED ORDER — ENTRESTO 49-51 MG PO TABS: 1.0000 | ORAL_TABLET | Freq: Two times a day (BID) | ORAL | 3 refills | Status: DC

## 2021-04-19 NOTE — Progress Notes (Signed)
Patient presents to clinic today drive line exit wound care alone.  Reports no problems with VAD equipment or concerns with drive line.  ? ?Pt tells me today that he called to get his entresto refilled on Friday last week. Pt states that the pharmacy did not get the refill request. Pt states that he felt "bad" all weekend because he was out of Entresto. I sent him refills on his Entresto today, as well as obtained a sample bottle of Entresto 49-51 so that the pt could take one pill immediately without waiting to pick up his refill this afternoon. Medication Samples have been provided to the patient. ? ?Drug name: Entresto       Strength: 49-51        Qty: 28  LOT: [id number]  Exp.Date: 1/25 ? ?Dosing instructions: Take one tablet twice daily ? ?The patient has been instructed regarding the correct time, dose, and frequency of taking this medication, including desired effects and most common side effects.  ? ?Pt has not scheduled an appt with his PCP to manage his DM. Pts A1C at his last visit was 10.6. Pt was informed that he must make an appt with them ASAP. Pt understands this situation and assures me will make an appt with them today. D/w Dr Gala Romney, will start Metformin today. Pt informed that this will drug will ultimately not fix his issue but will help. Pt informed that he must get an appt ASAP. ? ?Exit Site Care: Existing VAD dressing removed and site care performed using sterile technique. Drive line exit site cleaned with Chlora prep applicators x 2, RINSED WITH SALINE, and allowed to dry. and Sorbaview dressing with Silverlon applied. Exit site healed and fully incorporated, the velour is fully implanted at exit site. No drainage, tenderness, foul odor or rash noted. Drive line anchor re-applied. Pt denies fever or chills. Pt was given 4 weekly kits today. ?  ? ?Plan:  ?Start Metformin 1000 twice a day. Make an appt with your PCP immediately to help manage your diabetes.  ?Return in 1 week for dressing  change  ? ?[name] RN, VAD Coordinator  ?Office: [phone]  ?24/7 Pager: [phone]  ? ? ? ? ? ?

## 2021-04-19 NOTE — Progress Notes (Signed)
LVAD INR 

## 2021-04-21 ENCOUNTER — Encounter (HOSPITAL_COMMUNITY): Payer: Medicare Other

## 2021-04-27 ENCOUNTER — Telehealth (HOSPITAL_COMMUNITY): Payer: Self-pay | Admitting: *Deleted

## 2021-04-27 ENCOUNTER — Other Ambulatory Visit (HOSPITAL_COMMUNITY): Payer: Self-pay | Admitting: *Deleted

## 2021-04-27 DIAGNOSIS — Z95811 Presence of heart assist device: Secondary | ICD-10-CM

## 2021-04-27 DIAGNOSIS — Z7901 Long term (current) use of anticoagulants: Secondary | ICD-10-CM

## 2021-04-27 DIAGNOSIS — I5022 Chronic systolic (congestive) heart failure: Secondary | ICD-10-CM

## 2021-04-27 NOTE — Telephone Encounter (Signed)
Called patient and left message regarding tomorrow's VAD Clinic appointment. Asked patient to bring home VAD equipment for annual maintenance and be prepared to do a 6 minute walk. ? ?[name] RN, VAD Coordinator ?[phone] ?

## 2021-04-28 ENCOUNTER — Ambulatory Visit (HOSPITAL_COMMUNITY)
Admission: RE | Admit: 2021-04-28 | Discharge: 2021-04-28 | Disposition: A | Discharge status: Home / Self Care | Payer: Medicare Other | Source: Ambulatory Visit | Attending: Internal Medicine | Admitting: Internal Medicine

## 2021-04-28 ENCOUNTER — Ambulatory Visit (HOSPITAL_COMMUNITY): Payer: Self-pay | Admitting: Pharmacist

## 2021-04-28 VITALS — BP 98/0 | HR 80 | Ht 72.0 in | Wt 264.8 lb

## 2021-04-28 DIAGNOSIS — Z7982 Long term (current) use of aspirin: Secondary | ICD-10-CM | POA: Insufficient documentation

## 2021-04-28 DIAGNOSIS — Z7901 Long term (current) use of anticoagulants: Secondary | ICD-10-CM | POA: Diagnosis not present

## 2021-04-28 DIAGNOSIS — Z7984 Long term (current) use of oral hypoglycemic drugs: Secondary | ICD-10-CM | POA: Insufficient documentation

## 2021-04-28 DIAGNOSIS — I130 Hypertensive heart and chronic kidney disease with heart failure and stage 1 through stage 4 chronic kidney disease, or unspecified chronic kidney disease: Secondary | ICD-10-CM | POA: Diagnosis not present

## 2021-04-28 DIAGNOSIS — Z09 Encounter for follow-up examination after completed treatment for conditions other than malignant neoplasm: Secondary | ICD-10-CM | POA: Diagnosis not present

## 2021-04-28 DIAGNOSIS — E118 Type 2 diabetes mellitus with unspecified complications: Secondary | ICD-10-CM

## 2021-04-28 DIAGNOSIS — N1831 Chronic kidney disease, stage 3a: Secondary | ICD-10-CM | POA: Diagnosis not present

## 2021-04-28 DIAGNOSIS — E1122 Type 2 diabetes mellitus with diabetic chronic kidney disease: Secondary | ICD-10-CM | POA: Insufficient documentation

## 2021-04-28 DIAGNOSIS — I5022 Chronic systolic (congestive) heart failure: Secondary | ICD-10-CM

## 2021-04-28 DIAGNOSIS — Z95811 Presence of heart assist device: Secondary | ICD-10-CM | POA: Diagnosis not present

## 2021-04-28 DIAGNOSIS — Z86718 Personal history of other venous thrombosis and embolism: Secondary | ICD-10-CM | POA: Insufficient documentation

## 2021-04-28 DIAGNOSIS — I428 Other cardiomyopathies: Secondary | ICD-10-CM | POA: Insufficient documentation

## 2021-04-28 DIAGNOSIS — E1169 Type 2 diabetes mellitus with other specified complication: Secondary | ICD-10-CM

## 2021-04-28 DIAGNOSIS — I4892 Unspecified atrial flutter: Secondary | ICD-10-CM | POA: Insufficient documentation

## 2021-04-28 DIAGNOSIS — I5042 Chronic combined systolic (congestive) and diastolic (congestive) heart failure: Secondary | ICD-10-CM | POA: Diagnosis present

## 2021-04-28 LAB — CBC
HCT: 43.4 % (ref 39.0–52.0)
Hemoglobin: 14.5 g/dL (ref 13.0–17.0)
MCH: 29 pg (ref 26.0–34.0)
MCHC: 33.4 g/dL (ref 30.0–36.0)
MCV: 86.8 fL (ref 80.0–100.0)
Platelets: 264 10*3/uL (ref 150–400)
RBC: 5 MIL/uL (ref 4.22–5.81)
RDW: 15.9 % — ABNORMAL HIGH (ref 11.5–15.5)
WBC: 7.4 10*3/uL (ref 4.0–10.5)
nRBC: 0 % (ref 0.0–0.2)

## 2021-04-28 LAB — PROTIME-INR
INR: 2.3 — ABNORMAL HIGH (ref 0.8–1.2)
Prothrombin Time: 25 seconds — ABNORMAL HIGH (ref 11.4–15.2)

## 2021-04-28 MED ORDER — GLIPIZIDE 5 MG PO TABS: 5.0000 mg | ORAL_TABLET | Freq: Two times a day (BID) | ORAL | 3 refills | Status: DC

## 2021-04-28 NOTE — Progress Notes (Signed)
Patient presents for 2 month follow up in [facility] today alone. Reports no problems with VAD equipment or concerns with drive line.  ? ?Patient was started on Metformin last week for high BS and hgb A1c of 10.6. pt was instructed to call PCP to make an appt to manage DM. Pt has not done this. Pt unable to tolerate Metformin due to excessive diarrhea. Pt stopped the Metformin on Monday. Pt is not checking his BS at home. I have placed referral to [facility] Endocrinology per Dr. Haroldine Laws. ? ?Pt states that he is feeling good and playing golf several times a week. ? ?Lab called and pts blood is hemolyzed. This has happened multiple times with this pt. I spoke with lab and they suggest drawing labs in a syring opposed to transfer device. We will recheck BMET and LDH next week at his dressing change appt. ? ?Vital Signs:        ?Doppler Pressure: 98 ?Automatc BP:  107/69 (87)   ?HR: 80   ?SPO2: 98% on RA      ?  ?Weight: 264.8 lb w/ eqt  ?Last weight: 260 lb ?  ?VAD Indication: ?Destination Therapy - due to limited social support.  ?  ?LVAD assessment:      ?Speed: 5300 RPM      ?Flow: 4.4       ?Power: 4.0w       ?PI:  6.3   ?Alarms: none  ?Events:  30-40 PI events daily  ?Hct: 20      ? ?Fixed speed: 5300 ?Low speed limit: 5000 ? ?Primary Controller:  Replace back up battery in 14 months.  ?Back up controller:  Replace back up battery in 9 months. ? ?I reviewed the LVAD parameters from today and compared the results to the patient's prior recorded data. LVAD interrogation was STABLE for significant power changes, NEGATIVE for clinical alarms and STABLE for PI events/speed drops. No programming changes were made and pump is functioning within specified parameters.  ?  ?LVAD equipment check completed and is in good working order. Back-up equipment present.  ? ?Annual maintenance completed on patient's home equipment per [facility] 04/28/21. ? ?Exit Site Care:  ?Existing VAD dressing removed and site care performed using  sterile technique. Drive line exit site cleaned with Chlora prep applicators x 2, allowed to dry, and biopatch w/ Sorbaview dressing applied. Exit site healed and partially incorporated, the velour is fully implanted at exit site. No drainage, redness, tenderness, foul odor or rash noted. Drive line anchor re-applied. Pt denies fever or chills.  ? ?Device:  N/A ?  ?BP & Labs:  ?Doppler 98 - is reflecting MAP ?  ?Hgb 14.5 - No S/S of bleeding. Specifically denies melena/BRBPR or nosebleeds. ?  ?LDH hemolyzed - established baseline of 200 - 400. Denies tea-colored urine. No power elevations noted on interrogation other than above noted incidents.  ? ? ?Batteries Manufacture Date: Number of uses: Re-calibration  ?03/09/20 105 Performed by patient  ? ?Annual maintenance completed per [employer] on patient?s home power module and Electrical engineer.   ? ?Backup system controller 11 volt battery charged during visit. ?  ?4 year Intermacs follow up completed including:  Quality of Life, KCCQ-12, and Neurocognitive trail making.  ? ?Pt completed 1540 feet during 6 minute walk. ? ?[facility] Cardiomyopathy Questionnaire  ? ?  04/28/2021  ? 11:24 AM 10/19/2020  ?  2:26 PM 05/26/2020  ?  1:41 PM  ?KCCQ-12  ?1 a. Ability  to shower/bathe Not at all limited Not at all limited Not at all limited  ?1 b. Ability to walk 1 block Not at all limited Not at all limited Not at all limited  ?1 c. Ability to hurry/jog Not at all limited Not at all limited Not at all limited  ?2. Edema feet/ankles/legs Never over the past 2 weeks Less than once a week Never over the past 2 weeks  ?3. Limited by fatigue Never over the past 2 weeks Never over the past 2 weeks Never over the past 2 weeks  ?4. Limited by dyspnea Never over the past 2 weeks Never over the past 2 weeks Never over the past 2 weeks  ?5. Sitting up / on 3+ pillows Never over the past 2 weeks Never over the past 2 weeks Never over the past 2 weeks  ?6. Limited enjoyment of life  Moderately limited Slightly limited Slightly limited  ?7. Rest of life w/ symptoms Mostly dissatisfied Mostly dissatisfied Mostly dissatisfied  ?8 a. Participation in hobbies Slightly limited Did not limit at all Slightly limited  ?8 b. Participation in chores Did not limit at all Did not limit at all Did not limit at all  ?8 c. Visiting family/friends Did not limit at all Did not limit at all Did not limit at all  ?  ? ? ? 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 ? ?Patient Instructions:  ?Increase Glipizide to 2 pills twice daily ?We have referred you to [facility] to manage your diabetes. ?Return in one week for dressing change. ?Return for 2 mo f/u in [facility]  ? ? ?Tanda Rockers RN ?VAD Coordinator  ?Office: [phone]  ?24/7 Pager: [phone]  ?  ? ? ? ? ?

## 2021-04-28 NOTE — Patient Instructions (Signed)
Increase Glipizide to 2 pills twice daily ?We have referred you to [facility] Endocrinology to manage your diabetes. ?Return in one week for dressing change. ?Return for 2 mo f/u in VAD Clinic  ?

## 2021-04-28 NOTE — Progress Notes (Signed)
LVAD INR 

## 2021-04-29 ENCOUNTER — Other Ambulatory Visit (HOSPITAL_COMMUNITY): Payer: Self-pay | Admitting: Unknown Physician Specialty

## 2021-04-29 DIAGNOSIS — Z95811 Presence of heart assist device: Secondary | ICD-10-CM

## 2021-04-29 DIAGNOSIS — Z7901 Long term (current) use of anticoagulants: Secondary | ICD-10-CM

## 2021-04-30 NOTE — Progress Notes (Signed)
?LVAD Clinic Note  ? ?Primary Cardiologist: Dr. Haroldine Laws  ? ?HPI: ?Benjamin Sherman is a 51 y.o. male with history systolic heart failure due to NICM diagnosed in 10/2016, LV thrombus, atrial flutter, hypothyroidism and CKD Stage II-III s/p HM-3 LVAD on 05/15/17 ?  ?Admitted 10/18 with ADHF. ECHO showed severely reduced EF. 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 Placed on steroids without benefit. ? ?Admitted 11/18 after fall/syncopal episode resulting in bifrontal SAH. Neurosurgery consulted. ?  ?Admitted 4/19 with cardiogenic shock in setting of AFL. Supported with inotropes and underwent DC-CV but had persistent shock. After numerous discussions about concern for adequate social support underwent placement of HM-3 LVAD on 05/15/17. D/c home 06/04/17 ? ?Here for regular f/u. Feels good. Playing golf. Denies CP or SOB.Main issue is that sugars remain high. Denies orthopnea or PND. No fevers, chills or problems with driveline. No bleeding, melena or neuro symptoms. No VAD alarms. Taking all meds as prescribed.  ?  ?VAD Indication: ?Destination Therapy - due to limited social support.  ?  ?LVAD assessment:                                                    ?Speed: 5300 RPM                                                       ?Flow: 4.4                                                                      ?Power: 4.0w                                ?PI:  6.3              ?Alarms: none  ?Events:  30-40 PI events daily  ?Hct: 20                                                             ? ?Fixed speed: 5300 ?Low speed limit: 5000 ?  ?Primary Controller:  Replace back up battery in 14 months.  ?Back up controller:  Replace back up battery in 9 months. ?  ?I reviewed the LVAD parameters from today and compared the results to the patient's prior recorded data. LVAD interrogation was STABLE for significant power changes, NEGATIVE for clinical alarms and STABLE for PI events/speed drops. No programming  changes were made and pump is functioning within specified parameters.  ?  ?LVAD equipment check completed and is in good working order. Back-up equipment present.  ?  ?Annual maintenance completed on patient's home equipment per [facility] 04/28/21. ?  ? ?Past Medical History:  ?  Diagnosis Date  ? CHF (congestive heart failure) ([place])   ? Diabetes mellitus without complication ([place])   ? ? ?Current Outpatient Medications  ?Medication Sig Dispense Refill  ? amLODipine (NORVASC) 5 MG tablet Take 2 tablets (10 mg total) by mouth daily. 180 tablet 3  ? aspirin 81 MG EC tablet Take 1 tablet (81 mg total) by mouth daily. 90 tablet 3  ? pantoprazole (PROTONIX) 40 MG tablet Take 1 tablet (40 mg total) by mouth daily. 90 tablet 3  ? sacubitril-valsartan (ENTRESTO) 49-51 MG Take 1 tablet by mouth 2 (two) times daily. 180 tablet 3  ? warfarin (COUMADIN) 6 MG tablet TAKE 1/2 tablet (3 mg) Tues/Thurs and 1 tablet (6mg ) all other days; or as directed by HF clinic 90 tablet 5  ? Blood Glucose Monitoring Suppl (TRUE METRIX METER) DEVI 1 each by Does not apply route daily with breakfast. (Patient not taking: Reported on 04/28/2021) 1 Device 0  ? enoxaparin (LOVENOX) 40 MG/0.4ML injection Inject 0.4 mLs (40 mg total) into the skin every 12 (twelve) hours. (Patient not taking: Reported on 09/01/2019) 4 mL 0  ? glipiZIDE (GLUCOTROL) 5 MG tablet Take 1 tablet (5 mg total) by mouth 2 (two) times daily before a meal. 270 tablet 3  ? glucose blood (TRUE METRIX BLOOD GLUCOSE TEST) test strip Use daily before breakfast (Patient not taking: Reported on 04/28/2021) 30 each 12  ? TRUEPLUS LANCETS 28G MISC 1 each by Does not apply route daily. (Patient not taking: Reported on 04/28/2021) 30 each 12  ? ?No current facility-administered medications for this encounter.  ? ?Allergies  ?Allergen Reactions  ? Bee Venom   ?  UNSPECIFIED REACTION   ? ?Social History  ? ?Socioeconomic History  ? Marital status: Single  ?  Spouse name: Not on file  ? Number of  children: Not on file  ? Years of education: Not on file  ? Highest education level: Not on file  ?Occupational History  ? Occupation: security  ?Tobacco Use  ? Smoking status: Former  ?  Types: Cigars  ?  Quit date: 11/16/2016  ?  Years since quitting: 4.4  ? Smokeless tobacco: Never  ? Tobacco comments:  ?  6 cigars per wk  ?Vaping Use  ? Vaping Use: Never used  ?Substance and Sexual Activity  ? Alcohol use: Yes  ?  Alcohol/week: 4.0 standard drinks  ?  Types: 2 Shots of liquor, 2 Cans of beer per week  ?  Comment: ocasional  ? Drug use: No  ? Sexual activity: Not Currently  ?Other Topics Concern  ? Not on file  ?Social History Narrative  ? Not on file  ? ?Social Determinants of Health  ? ?Financial Resource Strain: Not on file  ?Food Insecurity: Not on file  ?Transportation Needs: Not on file  ?Physical Activity: Not on file  ?Stress: Not on file  ?Social Connections: Not on file  ?Intimate Partner Violence: Not on file  ?  ?Family History  ?Problem Relation Age of Onset  ? CAD Father   ? ?Vitals:  ? 04/28/21 1103 04/28/21 1124  ?BP: 107/69 (!) 98/0  ?Pulse: 80   ?SpO2: 98%   ?Weight: 120.1 kg (264 lb 12.8 oz)   ?Height: 6' (1.829 m)   ? ? ?Wt Readings from Last 3 Encounters:  ?04/28/21 120.1 kg (264 lb 12.8 oz)  ?02/09/21 117.9 kg (260 lb)  ?10/19/20 121.9 kg (268 lb 12.8 oz)  ?  ? ? ?  ?Vital  Signs:                                                                 ?Doppler Pressure: 98 ?Automatc BP:  107/69 (87)                  ?HR: 80              ?SPO2: 98% on RA                                                       ?  ?Weight: 264.8 lb w/ eqt  ?Last weight: 260 lb ?  ? ?Physical Exam: ?General:  NAD.  ?HEENT: normal  ?Neck: supple. JVP not elevated.  Carotids 2+ bilat; no bruits. No lymphadenopathy or thryomegaly appreciated. ?Cor: LVAD hum.  ?Lungs: Clear. ?Abdomen: soft, nontender, non-distended. No hepatosplenomegaly. No bruits or masses. Good bowel sounds. Driveline site clean. Anchor in place.   ?Extremities: no cyanosis, clubbing, rash. Warm no edema  ?Neuro: alert & oriented x 3. No focal deficits. Moves all 4 without problem  ? ? ?ASSESSMENT & PLAN: ? ?1. Chronic Combined Systolic/Diastolic Heart Failure. NICM  ?- ECHO 4/19 EF 20% CMRI EF 22%  ?- s/p HM-3 LVAD on 05/15/17 ?- Doing very well with VAD support. Remains NYHA I ?- Volume status looks good despite dietary indiscretion ? ?2. VAD management.  ?- VAD interrogated personally. Parameters stable. ?- Continues with frequent PI events but no low flows. No change,  ?- Labs hemolyzed again  ?- Remains on ASA 81 mg daily + coumadin. ?- INR 2.3 INR goal 2.0-2.5  Discussed dosing with PharmD personally. ?- DL site looks good ?- MAPs look good ? ?3. AFL ?- Remains in NSR. Off amio  ?- Continue coumadin ? ?4. HTN ?- MAPs look good ?- Continue Entresto at 49/51. ? ?5. DMII ?- Sugars remain markedly elevated ?- Doubt he will tolerate SGLT2i with volume issues.  ?- Increase glipizide ?- Refer to Endocrinology ? ?7. CKD 3a ?- Creatinine baseline 1.5-1.8 ?- Unable to get blood today.  ?- has been stable. Send to [facility] to repeat labs ? ?Total time spent 35 minutes. Over half that time spent discussing above.  ?  ? ?Glori Bickers, MD ?04/30/21  ? ?

## 2021-05-06 ENCOUNTER — Other Ambulatory Visit (HOSPITAL_COMMUNITY): Payer: Medicare Other

## 2021-05-20 ENCOUNTER — Other Ambulatory Visit (HOSPITAL_COMMUNITY): Payer: Self-pay | Admitting: Unknown Physician Specialty

## 2021-05-20 ENCOUNTER — Other Ambulatory Visit (HOSPITAL_COMMUNITY): Payer: Self-pay | Admitting: *Deleted

## 2021-05-20 DIAGNOSIS — Z7901 Long term (current) use of anticoagulants: Secondary | ICD-10-CM

## 2021-05-20 DIAGNOSIS — Z95811 Presence of heart assist device: Secondary | ICD-10-CM

## 2021-05-23 ENCOUNTER — Ambulatory Visit (HOSPITAL_COMMUNITY)
Admission: RE | Admit: 2021-05-23 | Discharge: 2021-05-23 | Disposition: A | Discharge status: Home / Self Care | Payer: Medicare Other | Source: Ambulatory Visit | Attending: Internal Medicine | Admitting: Internal Medicine

## 2021-05-23 ENCOUNTER — Ambulatory Visit (HOSPITAL_COMMUNITY): Payer: Self-pay | Admitting: Pharmacist

## 2021-05-23 DIAGNOSIS — Z7901 Long term (current) use of anticoagulants: Secondary | ICD-10-CM | POA: Insufficient documentation

## 2021-05-23 DIAGNOSIS — Z95811 Presence of heart assist device: Secondary | ICD-10-CM | POA: Insufficient documentation

## 2021-05-23 DIAGNOSIS — Z4801 Encounter for change or removal of surgical wound dressing: Secondary | ICD-10-CM | POA: Diagnosis not present

## 2021-05-23 LAB — PROTIME-INR
INR: 1.8 — ABNORMAL HIGH (ref 0.8–1.2)
Prothrombin Time: 20.4 seconds — ABNORMAL HIGH (ref 11.4–15.2)

## 2021-05-23 NOTE — Progress Notes (Signed)
Patient presents to clinic today for INR and drive line exit wound care alone.  Reports no problems with VAD equipment or concerns with drive line.  ? ? ?Exit Site Care: Existing VAD dressing removed and site care performed using sterile technique. Drive line exit site cleaned with Chlora prep applicators x 2, RINSED WITH SALINE, and allowed to dry. and Sorbaview dressing with Silverlon applied. Exit site healed and fully incorporated, the velour is fully implanted at exit site. No drainage, tenderness, foul odor or rash noted. Drive line anchor re-applied. Pt denies fever or chills. Provided patient with 4 dressing kits and ask that he bring one to each clinic visit.   ? ?Plan:  ?Return in 1 week for dressing change  ? ?[name] RN, VAD Coordinator  ?Office: [phone]  ?24/7 Pager: [phone]  ? ? ? ? ? ?

## 2021-05-23 NOTE — Progress Notes (Signed)
LVAD INR 

## 2021-06-02 ENCOUNTER — Other Ambulatory Visit (HOSPITAL_COMMUNITY): Payer: Self-pay | Admitting: *Deleted

## 2021-06-02 DIAGNOSIS — Z7901 Long term (current) use of anticoagulants: Secondary | ICD-10-CM

## 2021-06-02 DIAGNOSIS — Z95811 Presence of heart assist device: Secondary | ICD-10-CM

## 2021-06-03 ENCOUNTER — Ambulatory Visit (HOSPITAL_COMMUNITY)
Admission: RE | Admit: 2021-06-03 | Discharge: 2021-06-03 | Disposition: A | Discharge status: Home / Self Care | Payer: Medicare Other | Source: Ambulatory Visit | Attending: Internal Medicine | Admitting: Internal Medicine

## 2021-06-03 ENCOUNTER — Ambulatory Visit (HOSPITAL_COMMUNITY): Payer: Self-pay | Admitting: Pharmacist

## 2021-06-03 DIAGNOSIS — Z7901 Long term (current) use of anticoagulants: Secondary | ICD-10-CM

## 2021-06-03 DIAGNOSIS — Z48812 Encounter for surgical aftercare following surgery on the circulatory system: Secondary | ICD-10-CM | POA: Insufficient documentation

## 2021-06-03 DIAGNOSIS — Z95811 Presence of heart assist device: Secondary | ICD-10-CM

## 2021-06-03 LAB — PROTIME-INR
INR: 1.9 — ABNORMAL HIGH (ref 0.8–1.2)
Prothrombin Time: 21.7 seconds — ABNORMAL HIGH (ref 11.4–15.2)

## 2021-06-03 NOTE — Progress Notes (Signed)
Patient presents to clinic today for INR and drive line exit wound care alone.  Reports no problems with VAD equipment or concerns with drive line.    Exit Site Care: Existing VAD dressing removed and site care performed using sterile technique. Drive line exit site cleaned with Chlora prep applicators x 2, RINSED WITH SALINE, and allowed to dry. and Sorbaview dressing with Silverlon applied. Exit site healed and fully incorporated, the velour is fully implanted at exit site. No drainage, tenderness, foul odor or rash noted. Drive line anchor re-applied. Pt denies fever or chills.    Plan:  Return in 1 week for dressing change  PharmD will call you with INR results and warfarin dosing   [name] RN, VAD Coordinator  Office: [phone]  24/7 Pager: [phone]

## 2021-06-03 NOTE — Progress Notes (Signed)
LVAD INR 

## 2021-06-09 ENCOUNTER — Other Ambulatory Visit (HOSPITAL_COMMUNITY): Payer: Self-pay | Admitting: *Deleted

## 2021-06-09 DIAGNOSIS — Z95811 Presence of heart assist device: Secondary | ICD-10-CM

## 2021-06-09 DIAGNOSIS — Z7901 Long term (current) use of anticoagulants: Secondary | ICD-10-CM

## 2021-06-10 ENCOUNTER — Other Ambulatory Visit (HOSPITAL_COMMUNITY): Payer: Medicare Other

## 2021-06-17 ENCOUNTER — Ambulatory Visit (HOSPITAL_COMMUNITY): Payer: Self-pay | Admitting: Pharmacist

## 2021-06-17 ENCOUNTER — Ambulatory Visit (HOSPITAL_COMMUNITY)
Admission: RE | Admit: 2021-06-17 | Discharge: 2021-06-17 | Disposition: A | Discharge status: Home / Self Care | Payer: Medicare Other | Source: Ambulatory Visit | Attending: Internal Medicine | Admitting: Internal Medicine

## 2021-06-17 DIAGNOSIS — Z4801 Encounter for change or removal of surgical wound dressing: Secondary | ICD-10-CM | POA: Insufficient documentation

## 2021-06-17 DIAGNOSIS — Z7901 Long term (current) use of anticoagulants: Secondary | ICD-10-CM | POA: Diagnosis not present

## 2021-06-17 DIAGNOSIS — Z95811 Presence of heart assist device: Secondary | ICD-10-CM | POA: Insufficient documentation

## 2021-06-17 LAB — PROTIME-INR
INR: 1.5 — ABNORMAL HIGH (ref 0.8–1.2)
Prothrombin Time: 18.3 seconds — ABNORMAL HIGH (ref 11.4–15.2)

## 2021-06-17 NOTE — Progress Notes (Signed)
LVAD INR 

## 2021-06-17 NOTE — Progress Notes (Signed)
Patient presents to clinic today for INR and drive line exit wound care alone.  Reports no problems with VAD equipment or concerns with drive line.    Exit Site Care: Existing VAD dressing removed and site care performed using sterile technique. Drive line exit site cleaned with Chlora prep applicators x 2, RINSED WITH SALINE, and allowed to dry. and Sorbaview dressing with Silverlon applied. Exit site healed and fully incorporated, the velour is fully implanted at exit site. No drainage, tenderness, foul odor or rash noted. Drive line anchor re-applied. Covered with two large Tegaderm dressings.  Pt denies fever or chills. Provided patient with four weekly kits and asked him to bring one to each dressing change here.   Plan:  Return in 1 week for dressing change  PharmD will call you with INR results and warfarin dosing   Zada Girt RN, [place] Coordinator  Office: [phone]  24/7 Pager: [phone]

## 2021-06-21 ENCOUNTER — Other Ambulatory Visit (HOSPITAL_COMMUNITY): Payer: Self-pay | Admitting: *Deleted

## 2021-06-21 DIAGNOSIS — E1169 Type 2 diabetes mellitus with other specified complication: Secondary | ICD-10-CM

## 2021-06-21 MED ORDER — GLIPIZIDE 5 MG PO TABS: 5.0000 mg | ORAL_TABLET | Freq: Two times a day (BID) | ORAL | 3 refills | Status: DC

## 2021-06-23 ENCOUNTER — Telehealth (HOSPITAL_COMMUNITY): Payer: Self-pay | Admitting: *Deleted

## 2021-06-23 DIAGNOSIS — E1169 Type 2 diabetes mellitus with other specified complication: Secondary | ICD-10-CM

## 2021-06-23 MED ORDER — GLIPIZIDE 10 MG PO TABS: 10.0000 mg | ORAL_TABLET | Freq: Two times a day (BID) | ORAL | 3 refills | Status: DC

## 2021-06-23 NOTE — Telephone Encounter (Signed)
Patient called to report he is unable to pick up his Glipizide Rx from local pharmacy until 07/12/21.  Called [facility] and confirmed due to insurance company, pt cannot pick up Rx until that time. He last had rx for Glipizide 5 mg bid filled on 05/06/21.  Called patient to confirm dose, he was instructed to increase Glipizide to 10 mg bid at his last clinic visit. Updated Rx sent to local pharmacy. Pt will call if he has any issues picking up.   [name] RN, VAD Coordinator [phone]

## 2021-06-24 ENCOUNTER — Other Ambulatory Visit (HOSPITAL_COMMUNITY): Payer: Medicare Other

## 2021-06-28 ENCOUNTER — Other Ambulatory Visit (HOSPITAL_COMMUNITY): Payer: Self-pay | Admitting: *Deleted

## 2021-06-28 DIAGNOSIS — E1169 Type 2 diabetes mellitus with other specified complication: Secondary | ICD-10-CM

## 2021-06-28 DIAGNOSIS — Z95811 Presence of heart assist device: Secondary | ICD-10-CM

## 2021-06-28 DIAGNOSIS — Z7901 Long term (current) use of anticoagulants: Secondary | ICD-10-CM

## 2021-06-30 ENCOUNTER — Encounter (HOSPITAL_COMMUNITY): Payer: Self-pay

## 2021-06-30 ENCOUNTER — Ambulatory Visit (HOSPITAL_COMMUNITY)
Admission: RE | Admit: 2021-06-30 | Discharge: 2021-06-30 | Disposition: A | Discharge status: Home / Self Care | Payer: Medicare Other | Source: Ambulatory Visit | Attending: Internal Medicine | Admitting: Internal Medicine

## 2021-06-30 ENCOUNTER — Other Ambulatory Visit (HOSPITAL_COMMUNITY): Payer: Self-pay | Admitting: *Deleted

## 2021-06-30 ENCOUNTER — Ambulatory Visit (HOSPITAL_COMMUNITY): Payer: Self-pay | Admitting: Pharmacist

## 2021-06-30 ENCOUNTER — Encounter (HOSPITAL_COMMUNITY): Payer: Self-pay | Admitting: *Deleted

## 2021-06-30 VITALS — BP 96/67 | HR 72 | Temp 98.3°F | Wt 257.8 lb

## 2021-06-30 DIAGNOSIS — I10 Essential (primary) hypertension: Secondary | ICD-10-CM

## 2021-06-30 DIAGNOSIS — Z48812 Encounter for surgical aftercare following surgery on the circulatory system: Secondary | ICD-10-CM | POA: Diagnosis not present

## 2021-06-30 DIAGNOSIS — E1169 Type 2 diabetes mellitus with other specified complication: Secondary | ICD-10-CM | POA: Diagnosis not present

## 2021-06-30 DIAGNOSIS — N1831 Chronic kidney disease, stage 3a: Secondary | ICD-10-CM

## 2021-06-30 DIAGNOSIS — Z95811 Presence of heart assist device: Secondary | ICD-10-CM

## 2021-06-30 DIAGNOSIS — I5022 Chronic systolic (congestive) heart failure: Secondary | ICD-10-CM

## 2021-06-30 DIAGNOSIS — Z7901 Long term (current) use of anticoagulants: Secondary | ICD-10-CM

## 2021-06-30 LAB — BASIC METABOLIC PANEL
Anion gap: 10 (ref 5–15)
BUN: 24 mg/dL — ABNORMAL HIGH (ref 6–20)
CO2: 26 mmol/L (ref 22–32)
Calcium: 9.3 mg/dL (ref 8.9–10.3)
Chloride: 102 mmol/L (ref 98–111)
Creatinine, Ser: 1.63 mg/dL — ABNORMAL HIGH (ref 0.61–1.24)
GFR, Estimated: 50 mL/min — ABNORMAL LOW (ref >60)
Glucose, Bld: 130 mg/dL — ABNORMAL HIGH (ref 70–99)
Potassium: 5.3 mmol/L — ABNORMAL HIGH (ref 3.5–5.1)
Sodium: 138 mmol/L (ref 135–145)

## 2021-06-30 LAB — CBC
HCT: 42.7 % (ref 39.0–52.0)
Hemoglobin: 14.1 g/dL (ref 13.0–17.0)
MCH: 28.3 pg (ref 26.0–34.0)
MCHC: 33 g/dL (ref 30.0–36.0)
MCV: 85.7 fL (ref 80.0–100.0)
Platelets: 231 10*3/uL (ref 150–400)
RBC: 4.98 MIL/uL (ref 4.22–5.81)
RDW: 15.4 % (ref 11.5–15.5)
WBC: 7.5 10*3/uL (ref 4.0–10.5)
nRBC: 0 % (ref 0.0–0.2)

## 2021-06-30 LAB — PROTIME-INR
INR: 1.9 — ABNORMAL HIGH (ref 0.8–1.2)
Prothrombin Time: 21.4 seconds — ABNORMAL HIGH (ref 11.4–15.2)

## 2021-06-30 LAB — LACTATE DEHYDROGENASE: LDH: 362 U/L — ABNORMAL HIGH (ref 98–192)

## 2021-06-30 LAB — HEMOGLOBIN A1C
Hgb A1c MFr Bld: 7.6 % — ABNORMAL HIGH (ref 4.8–5.6)
Mean Plasma Glucose: 171.42 mg/dL

## 2021-06-30 NOTE — Patient Instructions (Addendum)
No medication changes Coumadin dosing per Leotis Shames PharmD Return to VAD clinic in 2 months Return to VAD clinic in 1 week for INR & dressing change Endocrinology appointment: Thursday 07/28/21 at 8:30 AM. Please remember to fill out and bring paperwork they are mailing you to your visit.   [facility]  [address] [place], [place], [place] [address] [phone]

## 2021-06-30 NOTE — Progress Notes (Signed)
LVAD INR 

## 2021-06-30 NOTE — Progress Notes (Addendum)
Patient presents for 2 month follow up in VAD Clinic today alone. Reports no problems with VAD equipment or concerns with drive line.   Pt states that he is feeling good and playing golf, and walking, several times a week. Denies lightheadedness, dizziness, falls, heart failure symptoms, and signs of bleeding.  Referral sent to [facility] at last visit. Their office is unable to see pt until January 2024. Referral sent to [facility] [place]. Called their office today with patient to set up initial visit: 07/28/21 at 8:30 AM. Pt to bring paperwork they mail to his PO box to appt. Provided pt with office phone number and address. Programed office phone number into his phone so he will answer a number he recognizes.   Reports he increased Glipizide to 10 mg BID as instructed last visit. Repeat A1C today 7.6 (down from 10.6). Pt does not tolerate Metformin due to excessive diarrhea. Reports he is checking his blood sugars at home, but not consistently. Reports sugars have "been running in the 200s".   Recently moved. Address updated in Epic. Will send updated letters to power company and EMS.   K 5.3, Cr 1.63, and LDH 362. Lab had to run specimens a few times in order to get results. Will plan to recollect next week when pt comes for dressing change. Dr Gala Romney aware.   Vital Signs:        Temp: 98.3 Doppler Pressure: 100 Automatc BP: 96/67 (79)   HR: 872  SPO2: 99% on RA        Weight: 257.8 lb w/ eqt  Last weight: 264.8 lb   VAD Indication: Destination Therapy - due to limited social support.    LVAD assessment:      Speed: 5300 RPM      Flow: 4.7       Power: 3.9 w       PI: 4.7  Alarms: none  Events:  30-50 PI events daily  Hct: 20       Fixed speed: 5300 Low speed limit: 5000  Primary Controller:  Replace back up battery in 12 months.  Back up controller:  Replace back up battery in 9 months.  I reviewed the LVAD parameters from today  and compared the results to the patient's prior recorded data. LVAD interrogation was STABLE for significant power changes, NEGATIVE for clinical alarms and STABLE for PI events/speed drops. No programming changes were made and pump is functioning within specified parameters.    LVAD equipment check completed and is in good working order. Back-up equipment present.   Annual maintenance completed on patient's home equipment per [facility] 04/28/21.  Exit Site Care:  Existing VAD dressing removed and site care performed using sterile technique. Drive line exit site cleaned with Chlora prep applicators x 2, allowed to dry, and biopatch w/ Sorbaview dressing applied. Exit site healed and incorporated, the velour is fully implanted at exit site. No drainage, redness, tenderness, foul odor or rash noted. Drive line anchor re-applied. Pt denies fever or chills.   Device:  N/A   BP & Labs:  Doppler 100 - is reflecting modified systolic   Hgb 14.1 - No S/S of bleeding. Specifically denies melena/BRBPR or nosebleeds.   LDH pending - established baseline of 200 - 400. Denies tea-colored urine. No power elevations noted on interrogation other than above noted incidents.   Patient Instructions:  No medication changes Coumadin dosing per Lauren PharmD Return to VAD clinic in 2 months Return to  VAD clinic in 1 week for INR & dressing change Endocrinology appointment: Thursday 07/28/21 at 8:30 AM. Please remember to fill out and bring paperwork they are mailing you to your visit.  [facility]  [address] [place], [place], [place] [address] [phone]  Alyce Pagan RN VAD Coordinator  Office: [phone]  24/7 Pager: [phone]

## 2021-07-04 NOTE — Progress Notes (Signed)
LVAD Clinic Note   Primary Cardiologist: Dr. Haroldine Laws   HPI: Benjamin Sherman is a 51 y.o. 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 ECHO showed severely reduced EF. 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 Placed on steroids without benefit.  Admitted 11/18 after fall/syncopal episode resulting in bifrontal SAH. Neurosurgery consulted.   Admitted 4/19 with cardiogenic shock in setting of AFL. Supported with inotropes and underwent DC-CV but had persistent shock. After numerous discussions about concern for adequate social support underwent placement of HM-3 LVAD on 05/15/17. D/c home 06/04/17  Here for f/u. Doing well. Watching his diet better. Glipizide increased.  Playing golf. Denies orthopnea or PND. No fevers, chills or problems with driveline. No bleeding, melena or neuro symptoms. No VAD alarms. Taking all meds as prescribed.      VAD Indication: Destination Therapy - due to limited social support.    LVAD assessment:                                                    Speed: 5300 RPM                                                       Flow: 4.7                                                                      Power: 3.9 w                               PI: 4.7   Alarms: none  Events:  30-50 PI events daily  Hct: 20                                                              Fixed speed: 5300 Low speed limit: 5000   Primary Controller:  Replace back up battery in 12 months.  Back up controller:  Replace back up battery in 9 months.   I reviewed the LVAD parameters from today and compared the results to the patient's prior recorded data. LVAD interrogation was STABLE for significant power changes, NEGATIVE for clinical alarms and STABLE for PI events/speed drops. No programming changes were made and  pump is functioning within specified parameters.    LVAD equipment check completed and is in good working order. Back-up equipment present.    Annual maintenance completed on patient's home equipment per [facility] 04/28/21.    Past Medical History:  Diagnosis Date   CHF (congestive heart failure) (HCC)    Diabetes  mellitus without complication (HCC)     Current Outpatient Medications  Medication Sig Dispense Refill   amLODipine (NORVASC) 5 MG tablet Take 2 tablets (10 mg total) by mouth daily. 180 tablet 3   aspirin 81 MG EC tablet Take 1 tablet (81 mg total) by mouth daily. 90 tablet 3   Blood Glucose Monitoring Suppl (TRUE METRIX METER) DEVI 1 each by Does not apply route daily with breakfast. 1 Device 0   glipiZIDE (GLUCOTROL) 10 MG tablet Take 1 tablet (10 mg total) by mouth 2 (two) times daily before a meal. 180 tablet 3   glucose blood (TRUE METRIX BLOOD GLUCOSE TEST) test strip Use daily before breakfast 30 each 12   pantoprazole (PROTONIX) 40 MG tablet Take 1 tablet (40 mg total) by mouth daily. 90 tablet 3   sacubitril-valsartan (ENTRESTO) 49-51 MG Take 1 tablet by mouth 2 (two) times daily. 180 tablet 3   TRUEPLUS LANCETS 28G MISC 1 each by Does not apply route daily. 30 each 12   warfarin (COUMADIN) 6 MG tablet TAKE 1/2 tablet (3 mg) Tues/Thurs and 1 tablet (6mg ) all other days; or as directed by HF clinic 90 tablet 5   enoxaparin (LOVENOX) 40 MG/0.4ML injection Inject 0.4 mLs (40 mg total) into the skin every 12 (twelve) hours. (Patient not taking: Reported on 09/01/2019) 4 mL 0   No current facility-administered medications for this encounter.   Allergies  Allergen Reactions   Bee Venom     UNSPECIFIED REACTION    Social History   Socioeconomic History   Marital status: Single    Spouse name: Not on file   Number of children: Not on file   Years of education: Not on file   Highest education level: Not on file  Occupational History   Occupation: security  Tobacco Use    Smoking status: Former    Types: Cigars    Quit date: 11/16/2016    Years since quitting: 4.6   Smokeless tobacco: Never   Tobacco comments:    6 cigars per wk  Vaping Use   Vaping Use: Never used  Substance and Sexual Activity   Alcohol use: Yes    Alcohol/week: 4.0 standard drinks of alcohol    Types: 2 Shots of liquor, 2 Cans of beer per week    Comment: ocasional   Drug use: No   Sexual activity: Not Currently  Other Topics Concern   Not on file  Social History Narrative   Not on file   Social Determinants of Health   Financial Resource Strain: Low Risk  (03/25/2020)   Overall Financial Resource Strain (CARDIA)    Difficulty of Paying Living Expenses: Not very hard  Food Insecurity: No Food Insecurity (03/25/2020)   Hunger Vital Sign    Worried About Running Out of Food in the Last Year: Never true    Ran Out of Food in the Last Year: Never true  Transportation Needs: No Transportation Needs (03/25/2020)   PRAPARE - 05/25/2020 (Medical): No    Lack of Transportation (Non-Medical): No  Physical Activity: Not on file  Stress: Not on file  Social Connections: Not on file  Intimate Partner Violence: Not on file    Family History  Problem Relation Age of Onset   CAD Father    Vitals:   06/30/21 0918 06/30/21 0919  BP: (!) 100/0 96/67  Pulse: 72   Temp:  98.3 F (36.8 C)  TempSrc:  Oral  SpO2: 99%   Weight: 116.9 kg (257 lb 12.8 oz)     Wt Readings from Last 3 Encounters:  06/30/21 116.9 kg (257 lb 12.8 oz)  04/28/21 120.1 kg (264 lb 12.8 oz)  02/09/21 117.9 kg (260 lb)        Vital Signs:                                                                 Temp: 98.3 Doppler Pressure: 100 Automatc BP: 96/67 (79)                     HR: 872            SPO2: 99% on RA                                                         Weight: 257.8 lb w/ eqt  Last weight: 264.8 lb    Physical Exam: General:  NAD.  HEENT: normal   Neck: supple. JVP not elevated.  Carotids 2+ bilat; no bruits. No lymphadenopathy or thryomegaly appreciated. Cor: LVAD hum.  Lungs: Clear. Abdomen: obese soft, nontender, non-distended. No hepatosplenomegaly. No bruits or masses. Good bowel sounds. Driveline site clean. Anchor in place.  Extremities: no cyanosis, clubbing, rash. Warm no edema  Neuro: alert & oriented x 3. No focal deficits. Moves all 4 without problem     ASSESSMENT & PLAN:  1. Chronic Combined Systolic/Diastolic Heart Failure. NICM  - ECHO 4/19 EF 20% CMRI EF 22%  - s/p HM-3 LVAD on 05/15/17 - Doing very well with VAD support. Remains NYHA I - Volume status looks good despite dietary indiscretion - though he is trying to improve  2. VAD management.  - VAD interrogated personally. Parameters stable.  - Continues with frequent PI events. Encouraged fluid intake - Remains on ASA 81 mg daily + coumadin. - INR 1.9 INR goal 2.0-2.5  Discussed dosing with PharmD personally. - DL site looks good - MAPs ok  - LDH up to 362. No dark urine or power spikes. Suspect due to hemolysis of lab sample. Will repeat next week   3. 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 Off amio  - Continue coumadin  4. HTN - MAPs look good - Continue Entresto at 49/51.  5. DMII - Sugars remain elevated but improving. HgbA1c down to 7.6 - Doubt he will tolerate SGLT2i with volume issues.  - Continue glipizide 10 bid. Unable to tolerate metformin due to diarrhea - Referral to Endocrinology in process  7. CKD 3a - Creatinine baseline 1.5-1.8 - Scr 1.6 today  Total time spent 35 minutes. Over half that time spent discussing above.    [name], MD 07/04/21

## 2021-07-08 ENCOUNTER — Other Ambulatory Visit (HOSPITAL_COMMUNITY): Payer: Medicare Other

## 2021-07-15 ENCOUNTER — Other Ambulatory Visit (HOSPITAL_COMMUNITY): Payer: Self-pay | Admitting: *Deleted

## 2021-07-15 DIAGNOSIS — Z7901 Long term (current) use of anticoagulants: Secondary | ICD-10-CM

## 2021-07-15 DIAGNOSIS — Z95811 Presence of heart assist device: Secondary | ICD-10-CM

## 2021-07-22 ENCOUNTER — Ambulatory Visit (HOSPITAL_COMMUNITY)
Admission: RE | Admit: 2021-07-22 | Discharge: 2021-07-22 | Disposition: A | Discharge status: Home / Self Care | Payer: Medicare Other | Source: Ambulatory Visit | Attending: Cardiology | Admitting: Cardiology

## 2021-07-22 ENCOUNTER — Ambulatory Visit (HOSPITAL_COMMUNITY): Payer: Self-pay | Admitting: Pharmacist

## 2021-07-22 ENCOUNTER — Other Ambulatory Visit (HOSPITAL_COMMUNITY): Payer: Self-pay | Admitting: *Deleted

## 2021-07-22 DIAGNOSIS — Z7901 Long term (current) use of anticoagulants: Secondary | ICD-10-CM | POA: Insufficient documentation

## 2021-07-22 DIAGNOSIS — Z95811 Presence of heart assist device: Secondary | ICD-10-CM

## 2021-07-22 LAB — PROTIME-INR
INR: 2.1 — ABNORMAL HIGH (ref 0.8–1.2)
Prothrombin Time: 23.2 seconds — ABNORMAL HIGH (ref 11.4–15.2)

## 2021-07-22 NOTE — Progress Notes (Signed)
Patient presents to clinic today for INR and drive line exit wound care alone.  Reports no problems with VAD equipment or concerns with drive line.    Exit Site Care: Existing VAD dressing removed and site care performed using sterile technique. Drive line exit site cleaned with Chlora prep applicators x 2, RINSED WITH SALINE, and allowed to dry. and Sorbaview dressing with Silverlon applied. Exit site healed and fully incorporated, the velour is fully implanted at exit site. No drainage, tenderness, or foul odor. There is some skin excoriation under the anchor. Sorbaview and anchor applied to leave this area OTA. Drive line anchor re-applied. Covered with 1 large Tegaderm dressing.  Pt denies fever or chills. Provided patient with four weekly kits and asked him to bring one to each dressing change here.   Plan:  Return in 1 week for dressing change  PharmD will call you with INR results and warfarin dosing  [name] RN, VAD Coordinator  Office: [phone]  24/7 Pager: [phone]

## 2021-07-22 NOTE — Progress Notes (Signed)
LVAD INR 

## 2021-07-28 ENCOUNTER — Ambulatory Visit: Payer: Medicare Other | Admitting: Nurse Practitioner

## 2021-07-28 DIAGNOSIS — E1122 Type 2 diabetes mellitus with diabetic chronic kidney disease: Secondary | ICD-10-CM

## 2021-07-29 ENCOUNTER — Other Ambulatory Visit (HOSPITAL_COMMUNITY): Payer: Medicare Other

## 2021-08-09 ENCOUNTER — Other Ambulatory Visit (HOSPITAL_COMMUNITY): Payer: Self-pay | Admitting: *Deleted

## 2021-08-09 DIAGNOSIS — Z7901 Long term (current) use of anticoagulants: Secondary | ICD-10-CM

## 2021-08-09 DIAGNOSIS — Z95811 Presence of heart assist device: Secondary | ICD-10-CM

## 2021-08-12 ENCOUNTER — Ambulatory Visit (HOSPITAL_COMMUNITY): Payer: Self-pay | Admitting: Pharmacist

## 2021-08-12 ENCOUNTER — Ambulatory Visit (HOSPITAL_COMMUNITY)
Admission: RE | Admit: 2021-08-12 | Discharge: 2021-08-12 | Disposition: A | Discharge status: Home / Self Care | Payer: Medicare Other | Source: Ambulatory Visit | Attending: Cardiology | Admitting: Cardiology

## 2021-08-12 DIAGNOSIS — Z7901 Long term (current) use of anticoagulants: Secondary | ICD-10-CM | POA: Insufficient documentation

## 2021-08-12 DIAGNOSIS — Z95811 Presence of heart assist device: Secondary | ICD-10-CM | POA: Insufficient documentation

## 2021-08-12 LAB — PROTIME-INR
INR: 1.9 — ABNORMAL HIGH (ref 0.8–1.2)
Prothrombin Time: 21.2 seconds — ABNORMAL HIGH (ref 11.4–15.2)

## 2021-08-12 NOTE — Progress Notes (Signed)
LVAD INR 

## 2021-08-12 NOTE — Progress Notes (Signed)
Patient presents to clinic today for INR and drive line exit wound care alone.  Reports no problems with VAD equipment or concerns with drive line.    Exit Site Care: Existing VAD dressing removed and site care performed using sterile technique. Drive line exit site cleaned with Chlora prep applicators x 2, RINSED WITH SALINE, and allowed to dry. and Sorbaview dressing with Silverlon applied. Exit site healed and fully incorporated, the velour is fully implanted at exit site. No drainage, tenderness, or foul odor. There is some skin excoriation under the anchor. Sorbaview and anchor applied to leave this area OTA. Drive line anchor re-applied. Covered with 1 large Tegaderm dressing.  Pt denies fever or chills. Provided patient with four weekly kits and asked him to bring one to each dressing change here.   Plan:  Return in 1 week for dressing change  PharmD will call you with INR results and warfarin dosing  Demiana Crumbley RN, VAD Coordinator  Office: [phone]  24/7 Pager: [phone]       

## 2021-08-19 ENCOUNTER — Other Ambulatory Visit (HOSPITAL_COMMUNITY): Payer: Medicare Other

## 2021-08-24 ENCOUNTER — Other Ambulatory Visit (HOSPITAL_COMMUNITY): Payer: Self-pay | Admitting: *Deleted

## 2021-08-24 DIAGNOSIS — Z7901 Long term (current) use of anticoagulants: Secondary | ICD-10-CM

## 2021-08-24 DIAGNOSIS — Z95811 Presence of heart assist device: Secondary | ICD-10-CM

## 2021-08-26 ENCOUNTER — Ambulatory Visit (HOSPITAL_COMMUNITY)
Admission: RE | Admit: 2021-08-26 | Discharge: 2021-08-26 | Disposition: A | Discharge status: Home / Self Care | Payer: Medicare Other | Source: Ambulatory Visit | Attending: Cardiology | Admitting: Cardiology

## 2021-08-26 ENCOUNTER — Ambulatory Visit (HOSPITAL_COMMUNITY): Payer: Self-pay | Admitting: Pharmacist

## 2021-08-26 DIAGNOSIS — Z95811 Presence of heart assist device: Secondary | ICD-10-CM | POA: Insufficient documentation

## 2021-08-26 DIAGNOSIS — Z7901 Long term (current) use of anticoagulants: Secondary | ICD-10-CM | POA: Diagnosis not present

## 2021-08-26 LAB — PROTIME-INR
INR: 2.6 — ABNORMAL HIGH (ref 0.8–1.2)
Prothrombin Time: 27.7 seconds — ABNORMAL HIGH (ref 11.4–15.2)

## 2021-08-26 NOTE — Addendum Note (Signed)
Encounter addended by: Lebron Quam, RN on: 08/26/2021 9:17 AM  Actions taken: Clinical Note Signed

## 2021-08-26 NOTE — Progress Notes (Signed)
Patient presents to clinic today for INR and drive line exit wound care alone.  Reports no problems with VAD equipment or concerns with drive line.    Exit Site Care: Existing VAD dressing removed and site care performed using sterile technique. Drive line exit site cleaned with Chlora prep applicators x 2, RINSED WITH SALINE, and allowed to dry. and Sorbaview dressing with Silverlon applied. Exit site healed and fully incorporated, the velour is fully implanted at exit site. No drainage, tenderness, or foul odor. There is small area of skin excoriation under the dressing. Sorbaview and anchor applied. Drive line anchor re-applied. Did not cover with large tegaderm today due to skin excoriation. Pt denies fever or chills. Pt has sufficient dressing kits.   Plan:  Return in 1 week for full visit with DR Bensimhon. PharmD will call you with INR results and warfarin dosing  [name] RN, VAD Coordinator  Office: [phone]  24/7 Pager: [phone]

## 2021-08-31 ENCOUNTER — Other Ambulatory Visit (HOSPITAL_COMMUNITY): Payer: Self-pay | Admitting: *Deleted

## 2021-08-31 DIAGNOSIS — Z7901 Long term (current) use of anticoagulants: Secondary | ICD-10-CM

## 2021-08-31 DIAGNOSIS — Z95811 Presence of heart assist device: Secondary | ICD-10-CM

## 2021-09-02 ENCOUNTER — Other Ambulatory Visit (HOSPITAL_COMMUNITY): Payer: Self-pay | Admitting: *Deleted

## 2021-09-02 ENCOUNTER — Ambulatory Visit (HOSPITAL_COMMUNITY): Payer: Self-pay | Admitting: Pharmacist

## 2021-09-02 ENCOUNTER — Ambulatory Visit (HOSPITAL_COMMUNITY)
Admission: RE | Admit: 2021-09-02 | Discharge: 2021-09-02 | Disposition: A | Discharge status: Home / Self Care | Payer: Medicare Other | Source: Ambulatory Visit | Attending: Cardiology | Admitting: Cardiology

## 2021-09-02 ENCOUNTER — Encounter (HOSPITAL_COMMUNITY): Payer: Self-pay

## 2021-09-02 DIAGNOSIS — Z7901 Long term (current) use of anticoagulants: Secondary | ICD-10-CM | POA: Diagnosis not present

## 2021-09-02 DIAGNOSIS — Z95811 Presence of heart assist device: Secondary | ICD-10-CM | POA: Insufficient documentation

## 2021-09-02 LAB — CBC
HCT: 48.9 % (ref 39.0–52.0)
Hemoglobin: 15 g/dL (ref 13.0–17.0)
MCH: 28.4 pg (ref 26.0–34.0)
MCHC: 30.7 g/dL (ref 30.0–36.0)
MCV: 92.6 fL (ref 80.0–100.0)
Platelets: 176 10*3/uL (ref 150–400)
RBC: 5.28 MIL/uL (ref 4.22–5.81)
RDW: 16.4 % — ABNORMAL HIGH (ref 11.5–15.5)
WBC: 7 10*3/uL (ref 4.0–10.5)
nRBC: 0 % (ref 0.0–0.2)

## 2021-09-02 LAB — PROTIME-INR
INR: 2.2 — ABNORMAL HIGH (ref 0.8–1.2)
Prothrombin Time: 24.5 seconds — ABNORMAL HIGH (ref 11.4–15.2)

## 2021-09-02 NOTE — Patient Instructions (Addendum)
No medication changes Coumadin dosing per Lauren PharmD Return to VAD clinic in 2 months. We will complete your 4.5 yr Intermacs with 6 minute walk at this visit. Please wear tennis shoes for your walk.  Return to VAD clinic in 1 week for INR & dressing change Endocrinology appointment: Monday 10/10/21 at 8:30 AM. Please remember to fill out and bring paperwork they are mailing you to your visit.  [facility]  [address] [place], [place], [place] [address] [phone]

## 2021-09-02 NOTE — Progress Notes (Addendum)
Patient presents for 2 month follow up in VAD Clinic today alone. Reports no problems with VAD equipment or concerns with drive line.   Pt states that he is feeling good and playing golf, and walking, several times a week. Denies lightheadedness, dizziness, falls, heart failure symptoms, and signs of bleeding.  He no showed new patient endocrinology appt in July. Called their office today with patient to reschedule initial visit: Monday 10/10/21 at 8:30 AM. Pt to bring paperwork they mail to his PO box to appt. Provided pt with office phone number and address. Programed office phone number into his phone so he will answer a number he recognizes. Advised he must go to this appointment. He verbalized understanding.   Taking Glipizide to 10 mg BID as instructed. Last A1C 7.6 (down from 10.6). Pt does not tolerate Metformin due to excessive diarrhea. Reports he is checking his blood sugars at home, but not consistently. He states he eliminated red meat from his diet. Admits he is eating a lot of pasta. Discussed need to limit/eat in moderation carbs/sugar in his diet. He verbalized understanding.   BMET & LDH both hemolyzed per lab despite being drawn with a syringe. Will repeat with next INR draw.   Vital Signs:        Temp:  Doppler Pressure: 106 Automatc BP: 123/66 (88)   HR: 83  SPO2: 97% on RA        Weight: 256.4 lb w/ eqt  Last weight: 257.8 lb   VAD Indication: Destination Therapy - due to limited social support.    LVAD assessment:      Speed: 5300 RPM      Flow: 4.7       Power: 4.0 w       PI: 4.3  Alarms: none  Events:  30-50 PI events daily  Hct: 20       Fixed speed: 5300 Low speed limit: 5000  Primary Controller:  Replace back up battery in 10 months.  Back up controller:  Replace back up battery in 9 months.  I reviewed the LVAD parameters from today and compared the results to the patient's prior recorded data. LVAD interrogation was STABLE for significant power  changes, NEGATIVE for clinical alarms and STABLE for PI events/speed drops. No programming changes were made and pump is functioning within specified parameters.    LVAD equipment check completed and is in good working order. Back-up equipment present.   Annual maintenance completed on patient's home equipment per [facility] 04/28/21.  Exit Site Care:  Existing VAD dressing removed and site care performed using sterile technique. Drive line exit site cleaned with Chlora prep applicators x 2, allowed to dry, and biopatch w/ Sorbaview dressing applied. Exit site healed and incorporated, the velour is fully implanted at exit site. No drainage, redness, tenderness, foul odor or rash noted. Drive line anchor re-applied. Pt denies fever or chills.   Device:  N/A   BP & Labs:  Doppler 106 - is reflecting modified systolic   Hgb 15.0- No S/S of bleeding. Specifically denies melena/BRBPR or nosebleeds.   LDH (specimen hemolyzed per lab) - established baseline of 200 - 400. Denies tea-colored urine. No power elevations noted on interrogation other than above noted incidents.   Patient Instructions:  No medication changes Coumadin dosing per Lauren PharmD Return to VAD clinic in 2 months. We will complete your 4.5 yr Intermacs with 6 minute walk at this visit. Please wear tennis shoes for your walk.  Return  to VAD clinic in 1 week for INR & dressing change Endocrinology appointment: Monday 10/10/21 at 8:30 AM. Please remember to fill out and bring paperwork they are mailing you to your visit.  [facility]  [address] [place], [place], [place] [address] [phone]  Alyce Pagan RN VAD Coordinator  Office: [phone]  24/7 Pager: [phone]

## 2021-09-08 ENCOUNTER — Other Ambulatory Visit (HOSPITAL_COMMUNITY): Payer: Self-pay

## 2021-09-08 DIAGNOSIS — Z95811 Presence of heart assist device: Secondary | ICD-10-CM

## 2021-09-08 DIAGNOSIS — Z7901 Long term (current) use of anticoagulants: Secondary | ICD-10-CM

## 2021-09-08 DIAGNOSIS — I5022 Chronic systolic (congestive) heart failure: Secondary | ICD-10-CM

## 2021-09-09 ENCOUNTER — Other Ambulatory Visit (HOSPITAL_COMMUNITY): Payer: Medicare Other

## 2021-09-20 ENCOUNTER — Ambulatory Visit: Payer: Self-pay | Admitting: Licensed Clinical Social Worker

## 2021-09-20 NOTE — Patient Outreach (Signed)
  Care Coordination   09/20/2021 Name: Benjamin Sherman MRN: [id number] DOB: 04/28/1970   Care Coordination Outreach Attempts:  An unsuccessful telephone outreach was attempted today to offer the patient information about available care coordination services as a benefit of their health plan.   Follow Up Plan:  Additional outreach attempts will be made to offer the patient care coordination information and services.   Encounter Outcome:  No Answer  Care Coordination Interventions Activated:  No   Care Coordination Interventions:  No, not indicated    Jenel Lucks, MSW, LCSW [facility] Care Management [facility]  [facility] [email or web address] Phone [phone] 11:32 AM

## 2021-09-23 ENCOUNTER — Other Ambulatory Visit (HOSPITAL_COMMUNITY): Payer: Self-pay | Admitting: *Deleted

## 2021-09-23 DIAGNOSIS — Z7901 Long term (current) use of anticoagulants: Secondary | ICD-10-CM

## 2021-09-23 DIAGNOSIS — Z95811 Presence of heart assist device: Secondary | ICD-10-CM

## 2021-09-27 ENCOUNTER — Ambulatory Visit (HOSPITAL_COMMUNITY)
Admission: RE | Admit: 2021-09-27 | Discharge: 2021-09-27 | Disposition: A | Discharge status: Home / Self Care | Payer: Medicare Other | Source: Ambulatory Visit | Attending: Internal Medicine | Admitting: Internal Medicine

## 2021-09-27 ENCOUNTER — Ambulatory Visit (HOSPITAL_COMMUNITY): Payer: Self-pay | Admitting: Pharmacist

## 2021-09-27 DIAGNOSIS — Z95811 Presence of heart assist device: Secondary | ICD-10-CM | POA: Insufficient documentation

## 2021-09-27 DIAGNOSIS — Z7901 Long term (current) use of anticoagulants: Secondary | ICD-10-CM | POA: Diagnosis not present

## 2021-09-27 DIAGNOSIS — Z4801 Encounter for change or removal of surgical wound dressing: Secondary | ICD-10-CM | POA: Diagnosis not present

## 2021-09-27 LAB — PROTIME-INR
INR: 2.3 — ABNORMAL HIGH (ref 0.8–1.2)
Prothrombin Time: 25 seconds — ABNORMAL HIGH (ref 11.4–15.2)

## 2021-09-27 NOTE — Progress Notes (Signed)
Patient presents to clinic today for INR and drive line exit wound care alone.  Reports no problems with VAD equipment or concerns with drive line.    Exit Site Care: Existing VAD dressing removed and site care performed using sterile technique. Drive line exit site cleaned with Chlora prep applicators x 2, RINSED WITH SALINE, and allowed to dry. and Sorbaview dressing with Silverlon applied. Exit site healed and fully incorporated, the velour is fully implanted at exit site. No drainage, tenderness, or foul odor. Drive line anchor re-applied. Pt denies fever or chills. Pt provided with 4 weekly kits for home use.    Plan:  Return in 1 week for INR & dressing change PharmD will call you with INR results and warfarin dosing  Alyce Pagan RN VAD Coordinator  Office: [phone]  24/7 Pager: [phone]

## 2021-09-29 ENCOUNTER — Ambulatory Visit: Payer: Self-pay

## 2021-09-29 ENCOUNTER — Other Ambulatory Visit (HOSPITAL_COMMUNITY): Payer: Self-pay | Admitting: Unknown Physician Specialty

## 2021-09-29 DIAGNOSIS — Z95811 Presence of heart assist device: Secondary | ICD-10-CM

## 2021-09-29 NOTE — Patient Outreach (Signed)
  Care Coordination   09/29/2021 Name: Benjamin Sherman MRN: [id number] DOB: [birth year]   Care Coordination Outreach Attempts:  A second unsuccessful outreach was attempted today to offer the patient with information about available care coordination services as a benefit of their health plan.     Follow Up Plan:  Additional outreach attempts will be made to offer the patient care coordination information and services.   Encounter Outcome:  No Answer  Care Coordination Interventions Activated:  No   Care Coordination Interventions:  No, not indicated    Bevelyn Ngo, BSW, CDP Social Worker, Certified Dementia Practitioner [facility] Care Management  Care Coordination [phone]

## 2021-10-07 ENCOUNTER — Ambulatory Visit (HOSPITAL_COMMUNITY): Payer: Self-pay | Admitting: Pharmacist

## 2021-10-07 ENCOUNTER — Ambulatory Visit (HOSPITAL_COMMUNITY)
Admission: RE | Admit: 2021-10-07 | Discharge: 2021-10-07 | Disposition: A | Discharge status: Home / Self Care | Payer: Medicare Other | Source: Ambulatory Visit | Attending: Internal Medicine | Admitting: Internal Medicine

## 2021-10-07 DIAGNOSIS — Z95811 Presence of heart assist device: Secondary | ICD-10-CM | POA: Insufficient documentation

## 2021-10-07 DIAGNOSIS — Z7901 Long term (current) use of anticoagulants: Secondary | ICD-10-CM | POA: Insufficient documentation

## 2021-10-07 DIAGNOSIS — Z4801 Encounter for change or removal of surgical wound dressing: Secondary | ICD-10-CM | POA: Insufficient documentation

## 2021-10-07 LAB — PROTIME-INR
INR: 2.1 — ABNORMAL HIGH (ref 0.8–1.2)
Prothrombin Time: 23.7 seconds — ABNORMAL HIGH (ref 11.4–15.2)

## 2021-10-07 NOTE — Progress Notes (Signed)
Patient presents to clinic today for INR and drive line exit wound care alone.  Reports no problems with VAD equipment or concerns with drive line.    Exit Site Care: Existing VAD dressing removed and site care performed using sterile technique. Drive line exit site cleaned with Chlora prep applicators x 2, RINSED WITH SALINE, and allowed to dry. and Sorbaview dressing with Silverlon applied. Exit site healed and fully incorporated, the velour is fully implanted at exit site. No drainage, tenderness, or foul odor. Drive line anchor re-applied. Pt denies fever or chills.    Plan:  Return in 1 week for INR & dressing change PharmD will call you with INR results and warfarin dosing  Zada Girt RN [place] Coordinator  Office: [phone]  24/7 Pager: [phone]

## 2021-10-10 ENCOUNTER — Ambulatory Visit: Payer: Medicare Other | Admitting: Nurse Practitioner

## 2021-10-13 ENCOUNTER — Other Ambulatory Visit (HOSPITAL_COMMUNITY): Payer: Self-pay | Admitting: *Deleted

## 2021-10-13 DIAGNOSIS — Z95811 Presence of heart assist device: Secondary | ICD-10-CM

## 2021-10-13 DIAGNOSIS — Z7901 Long term (current) use of anticoagulants: Secondary | ICD-10-CM

## 2021-10-14 ENCOUNTER — Other Ambulatory Visit (HOSPITAL_COMMUNITY): Payer: Medicare Other

## 2021-10-17 ENCOUNTER — Other Ambulatory Visit (HOSPITAL_COMMUNITY): Payer: Self-pay | Admitting: Internal Medicine

## 2021-10-17 DIAGNOSIS — I5022 Chronic systolic (congestive) heart failure: Secondary | ICD-10-CM

## 2021-10-17 DIAGNOSIS — Z95811 Presence of heart assist device: Secondary | ICD-10-CM

## 2021-10-21 ENCOUNTER — Ambulatory Visit (HOSPITAL_COMMUNITY)
Admission: RE | Admit: 2021-10-21 | Discharge: 2021-10-21 | Disposition: A | Discharge status: Home / Self Care | Payer: Medicare Other | Source: Ambulatory Visit | Attending: Cardiology | Admitting: Cardiology

## 2021-10-21 ENCOUNTER — Ambulatory Visit (HOSPITAL_COMMUNITY): Payer: Self-pay | Admitting: Pharmacist

## 2021-10-21 DIAGNOSIS — Z95811 Presence of heart assist device: Secondary | ICD-10-CM | POA: Diagnosis not present

## 2021-10-21 DIAGNOSIS — Z7901 Long term (current) use of anticoagulants: Secondary | ICD-10-CM | POA: Diagnosis not present

## 2021-10-21 LAB — PROTIME-INR
INR: 2.3 — ABNORMAL HIGH (ref 0.8–1.2)
Prothrombin Time: 24.8 seconds — ABNORMAL HIGH (ref 11.4–15.2)

## 2021-10-21 NOTE — Progress Notes (Signed)
Patient presents to clinic today for INR and drive line exit wound care alone.  Reports no problems with VAD equipment or concerns with drive line.    Exit Site Care: Existing VAD dressing removed and site care performed using sterile technique. Drive line exit site cleaned with Chlora prep applicators x 2, RINSED WITH SALINE, and allowed to dry. and Sorbaview dressing with Silverlon applied. Exit site healed and fully incorporated, the velour is fully implanted at exit site. No drainage, tenderness, or foul odor. Drive line anchor re-applied. Pt denies fever or chills. Pt provided with 4 weekly kits for home use. Instructed to bring to clinic each week for dressing change.   Plan:  Return in 1 week for INR & dressing change PharmD will call you with INR results and warfarin dosing  Emerson Monte RN [place] Coordinator  Office: [phone]  24/7 Pager: [phone]

## 2021-10-28 ENCOUNTER — Other Ambulatory Visit (HOSPITAL_COMMUNITY): Payer: Medicare Other

## 2021-10-31 ENCOUNTER — Other Ambulatory Visit (HOSPITAL_COMMUNITY): Payer: Self-pay

## 2021-10-31 DIAGNOSIS — Z7901 Long term (current) use of anticoagulants: Secondary | ICD-10-CM

## 2021-10-31 DIAGNOSIS — I5022 Chronic systolic (congestive) heart failure: Secondary | ICD-10-CM

## 2021-10-31 DIAGNOSIS — Z95811 Presence of heart assist device: Secondary | ICD-10-CM

## 2021-11-02 ENCOUNTER — Ambulatory Visit (HOSPITAL_COMMUNITY)
Admission: RE | Admit: 2021-11-02 | Discharge: 2021-11-02 | Disposition: A | Discharge status: Home / Self Care | Payer: Medicare Other | Source: Ambulatory Visit | Attending: Internal Medicine | Admitting: Internal Medicine

## 2021-11-02 ENCOUNTER — Ambulatory Visit (HOSPITAL_COMMUNITY): Payer: Self-pay | Admitting: Pharmacist

## 2021-11-02 ENCOUNTER — Encounter (HOSPITAL_COMMUNITY): Payer: Self-pay

## 2021-11-02 VITALS — BP 100/72 | HR 75 | Wt 262.8 lb

## 2021-11-02 DIAGNOSIS — Z4801 Encounter for change or removal of surgical wound dressing: Secondary | ICD-10-CM | POA: Diagnosis present

## 2021-11-02 DIAGNOSIS — Z7901 Long term (current) use of anticoagulants: Secondary | ICD-10-CM | POA: Insufficient documentation

## 2021-11-02 DIAGNOSIS — I10 Essential (primary) hypertension: Secondary | ICD-10-CM

## 2021-11-02 DIAGNOSIS — I5022 Chronic systolic (congestive) heart failure: Secondary | ICD-10-CM | POA: Insufficient documentation

## 2021-11-02 DIAGNOSIS — Z95811 Presence of heart assist device: Secondary | ICD-10-CM | POA: Diagnosis not present

## 2021-11-02 DIAGNOSIS — N1831 Chronic kidney disease, stage 3a: Secondary | ICD-10-CM

## 2021-11-02 LAB — CBC
HCT: 41.6 % (ref 39.0–52.0)
Hemoglobin: 14.1 g/dL (ref 13.0–17.0)
MCH: 29.4 pg (ref 26.0–34.0)
MCHC: 33.9 g/dL (ref 30.0–36.0)
MCV: 86.8 fL (ref 80.0–100.0)
Platelets: 248 K/uL (ref 150–400)
RBC: 4.79 MIL/uL (ref 4.22–5.81)
RDW: 16 % — ABNORMAL HIGH (ref 11.5–15.5)
WBC: 7.1 K/uL (ref 4.0–10.5)
nRBC: 0 % (ref 0.0–0.2)

## 2021-11-02 LAB — PROTIME-INR
INR: 1.8 — ABNORMAL HIGH (ref 0.8–1.2)
Prothrombin Time: 21 seconds — ABNORMAL HIGH (ref 11.4–15.2)

## 2021-11-02 NOTE — Progress Notes (Addendum)
Patient presents for 2 month follow up with 4.5 year Intermacs in VAD Clinic today alone. Reports no problems with VAD equipment or concerns with drive line.   Pt states that he is feeling good and playing golf. He started back walking around his neighborhood this week. Denies lightheadedness, dizziness, falls, heart failure symptoms, and signs of bleeding.  Pt no-showed endocrinology appt in September. Advised he needs to call and reschedule missed appt. Provided with office contact information on AVS.   Reports he is taking medications as prescribed. Repeat A1C last visit was 7.6 (down from 10.6). Pt does not tolerate Metformin due to excessive diarrhea. Reports he is checking his blood sugars at home, but not consistently.   CMET & Prealbumin hemolyzed. Will attempt to recollect next visit.   Vital Signs:        Temp: Doppler Pressure: 96 Automatc BP: 100/72 (84)   HR: 75  SPO2: 97% on RA        Weight: 262.8 lb w/ eqt  Last weight: 256.4 lb   VAD Indication: Destination Therapy - due to limited social support.    LVAD assessment:      Speed: 5300 RPM      Flow: 4.2       Power: 4.0 w       PI: 6.4 Alarms: none  Events:  30-50 PI events daily  Hct: 20       Fixed speed: 5300 Low speed limit: 5000  Primary Controller:  Replace back up battery in 8 months.  Back up controller:  Expired. Replaced with [id number]. Manufacture: 04/01/21 Exp: 03/05/23. Replace back up battery in 29 months.  I reviewed the LVAD parameters from today and compared the results to the patient's prior recorded data. LVAD interrogation was STABLE for significant power changes, NEGATIVE for clinical alarms and STABLE for PI events/speed drops. No programming changes were made and pump is functioning within specified parameters.    LVAD equipment check completed and is in good working order. Back-up equipment present.   Annual maintenance completed on patient's home equipment per [facility] 04/28/21.  Exit Site  Care:  Existing VAD dressing removed and site care performed using sterile technique. Drive line exit site cleaned with Chlora prep applicators x 2, allowed to dry, and biopatch w/ Sorbaview dressing applied. Exit site healed and incorporated, the velour is fully implanted at exit site. No drainage, redness, tenderness, foul odor noted. Slight excoriation noted under previous anchor site. Drive line anchor repositioned and re-applied. Pt denies fever or chills. Pt has adequate dressing supplies at home.     Device:  N/A   BP & Labs:  Doppler 96 - is reflecting modified systolic   Hgb 14.1 - No S/S of bleeding. Specifically denies melena/BRBPR or nosebleeds.   LDH not drawn today- will collect with next INR draw - established baseline of 200 - 400. Denies tea-colored urine. No power elevations noted on interrogation other than above noted incidents.   4.5 yr Intermacs follow up completed including:  Quality of Life, KCCQ-12, and Neurocognitive trail making.   Pt completed 1550 feet during 6 minute walk.  Back up controller: 11V backup battery charged during this visit.  Patient Goals:  He would like to "reverse my diabetes". He plans to reschedule his endocrinology appt.   Patient Instructions:  No medication changes today Coumadin dosing per Lauren PharmD Return to clinic in 2 months Your diabetes health is very important. Please call the endocrinology office to reschedule your missed  appointment.  [facility] [place], [place], [place] [address] [phone]  Emerson Monte RN [place] Coordinator  Office: [phone]  24/7 Pager: [phone]

## 2021-11-02 NOTE — Patient Instructions (Signed)
No medication changes today Coumadin dosing per Lauren PharmD Return to clinic in 2 months Your diabetes health is very important. Please call the endocrinology office to reschedule your missed appointment.  [facility] [place], [place], [place] [address] [phone]

## 2021-11-04 ENCOUNTER — Encounter (HOSPITAL_COMMUNITY): Payer: Medicare Other

## 2021-11-06 NOTE — Progress Notes (Signed)
LVAD Clinic Note   Primary Cardiologist: Dr. Haroldine Laws   HPI: Benjamin Sherman is a 51 y.o. 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 ECHO showed severely reduced EF. 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 Placed on steroids without benefit.  Admitted 11/18 after fall/syncopal episode resulting in bifrontal SAH. Neurosurgery consulted.   Admitted 4/19 with cardiogenic shock in setting of AFL. Supported with inotropes and underwent DC-CV but had persistent shock. After numerous discussions about concern for adequate social support underwent placement of HM-3 LVAD on 05/15/17. D/c home 06/04/17  Here for routine f/u. Feels good. Playing golf 2x/week. Denies SOB. Wants a new set of golf clubs for Christmas. Denies orthopnea or PND. No fevers, chills or problems with driveline. No bleeding, melena or neuro symptoms. No VAD alarms. Taking all meds as prescribed.     VAD Indication: Destination Therapy - due to limited social support.    LVAD assessment:      Speed: 5300 RPM      Flow: 4.2       Power: 4.0 w       PI: 6.4 Alarms: none  Events:  30-50 PI events daily  Hct: 20       Fixed speed: 5300 Low speed limit: 5000  Primary Controller:  Replace back up battery in 8 months.  Back up controller:  Expired. Replaced with [id number]. Manufacture: 04/01/21 Exp: 03/05/23. Replace back up battery in 29 months.  I reviewed the LVAD parameters from today and compared the results to the patient's prior recorded data. LVAD interrogation was STABLE for significant power changes, NEGATIVE for clinical alarms and STABLE for PI events/speed drops. No programming changes were made and pump is functioning within specified parameters.    LVAD equipment check completed and is in good working order. Back-up equipment present.     Annual maintenance completed on patient's home equipment per [facility] 04/28/21.    Past Medical History:  Diagnosis Date   CHF (congestive heart failure) (HCC)    Diabetes mellitus without complication (HCC)     Current Outpatient Medications  Medication Sig Dispense Refill   amLODipine (NORVASC) 5 MG tablet Take 2 tablets (10 mg total) by mouth daily. 180 tablet 3   aspirin 81 MG EC tablet Take 1 tablet (81 mg total) by mouth daily. 90 tablet 3   Blood Glucose Monitoring Suppl (TRUE METRIX METER) DEVI 1 each by Does not apply route daily with breakfast. 1 Device 0   glipiZIDE (GLUCOTROL) 10 MG tablet Take 1 tablet (10 mg total) by mouth 2 (two) times daily before a meal. 180 tablet 3   glucose blood (TRUE METRIX BLOOD GLUCOSE TEST) test strip Use daily before breakfast 30 each 12   pantoprazole (PROTONIX) 40 MG tablet TAKE 1 TABLET BY MOUTH DAILY. 90 tablet 3   sacubitril-valsartan (ENTRESTO) 49-51 MG Take 1 tablet by mouth 2 (two) times daily. 180 tablet 3   TRUEPLUS LANCETS 28G MISC 1 each by Does not apply route daily. 30 each 12   warfarin (COUMADIN) 6 MG tablet TAKE 1/2 tablet (3 mg) Tues/Thurs and 1 tablet (6mg ) all other days; or as directed by HF clinic 90 tablet 5   enoxaparin (LOVENOX) 40 MG/0.4ML injection Inject 0.4 mLs (40 mg total) into the skin every 12 (twelve) hours. (Patient not taking: Reported on 09/01/2019) 4 mL  0   No current facility-administered medications for this encounter.   Allergies  Allergen Reactions   Bee Venom     UNSPECIFIED REACTION    Social History   Socioeconomic History   Marital status: Single    Spouse name: Not on file   Number of children: Not on file   Years of education: Not on file   Highest education level: Not on file  Occupational History   Occupation: security  Tobacco Use   Smoking status: Former    Types: Cigars    Quit date: 11/16/2016    Years since quitting: 4.9   Smokeless tobacco: Never   Tobacco comments:    6  cigars per wk  Vaping Use   Vaping Use: Never used  Substance and Sexual Activity   Alcohol use: Yes    Alcohol/week: 4.0 standard drinks of alcohol    Types: 2 Shots of liquor, 2 Cans of beer per week    Comment: ocasional   Drug use: No   Sexual activity: Not Currently  Other Topics Concern   Not on file  Social History Narrative   Not on file   Social Determinants of Health   Financial Resource Strain: Low Risk  (03/25/2020)   Overall Financial Resource Strain (CARDIA)    Difficulty of Paying Living Expenses: Not very hard  Food Insecurity: No Food Insecurity (03/25/2020)   Hunger Vital Sign    Worried About Running Out of Food in the Last Year: Never true    Ran Out of Food in the Last Year: Never true  Transportation Needs: No Transportation Needs (03/25/2020)   PRAPARE - Hydrologist (Medical): No    Lack of Transportation (Non-Medical): No  Physical Activity: Not on file  Stress: Not on file  Social Connections: Not on file  Intimate Partner Violence: Not on file    Family History  Problem Relation Age of Onset   CAD Father    Vitals:   11/02/21 0852 11/02/21 0900  BP: (!) 96/0 100/72  Pulse: 75   SpO2: 97%   Weight: 119.2 kg (262 lb 12.8 oz)     Wt Readings from Last 3 Encounters:  11/02/21 119.2 kg (262 lb 12.8 oz)  09/02/21 111.8 kg (246 lb 6.4 oz)  06/30/21 116.9 kg (257 lb 12.8 oz)    Vital Signs:        Temp: Doppler Pressure: 96 Automatc BP: 100/72 (84)   HR: 75  SPO2: 97% on RA        Weight: 262.8 lb w/ eqt  Last weight: 256.4 lb    Physical Exam: General:  NAD.  HEENT: normal  Neck: supple. JVP not elevated.  Carotids 2+ bilat; no bruits. No lymphadenopathy or thryomegaly appreciated. Cor: LVAD hum.  Lungs: Clear. Abdomen: obese soft, nontender, non-distended. No hepatosplenomegaly. No bruits or masses. Good bowel sounds. Driveline site clean. Anchor in place.  Extremities: no cyanosis, clubbing, rash. Warm  no edema  Neuro: alert & oriented x 3. No focal deficits. Moves all 4 without problem     ASSESSMENT & PLAN:  1. Chronic Combined Systolic/Diastolic Heart Failure. NICM  - ECHO 4/19 EF 20% CMRI EF 22%  - s/p HM-3 LVAD on 05/15/17 - Doing very well with VAD support. Remains NYHA I - Volume status looks good despite weight gain. Discussed need to keep weight down.   2. VAD management.  - VAD interrogated personally. Parameters stable. - Continues with frequent PI  events. Encouraged fluid intake - Remains on ASA 81 mg daily + coumadin. - INR 1.8 INR goal 2.0-2.5  Discussed dosing with PharmD personally. - DL site looks good - MAPs ok - LDH hemolyzed. Will repeat at next lab draw. No other evidence of pump thrombosis.   3. 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 Off amio  - Continue coumadin  4. HTN - MAPs ok  - Continue Entresto at 49/51.  5. DMII - Sugars remain elevated but improving. HgbA1c down to 7.6 - Doubt he will tolerate SGLT2i with volume issues.  - Continue glipizide 10 bid. Unable to tolerate metformin due to diarrhea - Referral to Endocrinology in process  6. CKD 3a - Creatinine baseline 1.5-1.8 - Labs hemolyzed. Will repeat  7. Golf game - says he needs a new set of clubs for Christmas   Total time spent 35 minutes. Over half that time spent discussing above.    Glori Bickers, MD 11/06/21

## 2021-11-11 ENCOUNTER — Other Ambulatory Visit (HOSPITAL_COMMUNITY): Payer: Self-pay

## 2021-11-11 DIAGNOSIS — Z95811 Presence of heart assist device: Secondary | ICD-10-CM

## 2021-11-11 DIAGNOSIS — Z7901 Long term (current) use of anticoagulants: Secondary | ICD-10-CM

## 2021-11-17 ENCOUNTER — Ambulatory Visit (HOSPITAL_COMMUNITY): Payer: Self-pay | Admitting: Pharmacist

## 2021-11-17 ENCOUNTER — Ambulatory Visit (HOSPITAL_COMMUNITY)
Admission: RE | Admit: 2021-11-17 | Discharge: 2021-11-17 | Disposition: A | Discharge status: Home / Self Care | Payer: Medicare Other | Source: Ambulatory Visit | Attending: Cardiology | Admitting: Cardiology

## 2021-11-17 DIAGNOSIS — Z4800 Encounter for change or removal of nonsurgical wound dressing: Secondary | ICD-10-CM | POA: Diagnosis not present

## 2021-11-17 DIAGNOSIS — Z95811 Presence of heart assist device: Secondary | ICD-10-CM | POA: Insufficient documentation

## 2021-11-17 DIAGNOSIS — Z7901 Long term (current) use of anticoagulants: Secondary | ICD-10-CM | POA: Diagnosis not present

## 2021-11-17 LAB — COMPREHENSIVE METABOLIC PANEL
ALT: 20 U/L (ref 0–44)
AST: 30 U/L (ref 15–41)
Albumin: 3.6 g/dL (ref 3.5–5.0)
Alkaline Phosphatase: 34 U/L — ABNORMAL LOW (ref 38–126)
Anion gap: 8 (ref 5–15)
BUN: 24 mg/dL — ABNORMAL HIGH (ref 6–20)
CO2: 26 mmol/L (ref 22–32)
Calcium: 9.2 mg/dL (ref 8.9–10.3)
Chloride: 105 mmol/L (ref 98–111)
Creatinine, Ser: 1.56 mg/dL — ABNORMAL HIGH (ref 0.61–1.24)
GFR, Estimated: 53 mL/min — ABNORMAL LOW (ref >60)
Glucose, Bld: 155 mg/dL — ABNORMAL HIGH (ref 70–99)
Potassium: 4.5 mmol/L (ref 3.5–5.1)
Sodium: 139 mmol/L (ref 135–145)
Total Bilirubin: 1 mg/dL (ref 0.3–1.2)
Total Protein: 7.3 g/dL (ref 6.5–8.1)

## 2021-11-17 LAB — PROTIME-INR
INR: 2.1 — ABNORMAL HIGH (ref 0.8–1.2)
Prothrombin Time: 23.6 seconds — ABNORMAL HIGH (ref 11.4–15.2)

## 2021-11-17 LAB — PREALBUMIN: Prealbumin: 28 mg/dL (ref 18–38)

## 2021-11-17 LAB — LACTATE DEHYDROGENASE: LDH: 288 U/L — ABNORMAL HIGH (ref 98–192)

## 2021-11-17 NOTE — Progress Notes (Signed)
Patient presents to clinic today for labs and drive line exit wound care alone.  Reports no problems with VAD equipment or concerns with drive line.    Exit Site Care: Existing VAD dressing removed and site care performed using sterile technique. Drive line exit site cleaned with Chlora prep applicators x 2, RINSED WITH SALINE, and allowed to dry. and Sorbaview dressing with Silverlon applied. Exit site healed and fully incorporated, the velour is fully implanted at exit site. No drainage, tenderness, or foul odor. Drive line anchor re-applied. Pt denies fever or chills. Pt provided with 4 weekly kits and 8 anchors. Instructed to bring to clinic each week for dressing change.   Plan:  Return in 1 week fof dressing change PharmD will call you with INR results and warfarin dosing  Zada Girt RN [place] Coordinator  Office: [phone]  24/7 Pager: [phone]

## 2021-11-18 ENCOUNTER — Other Ambulatory Visit (HOSPITAL_COMMUNITY): Payer: Medicare Other

## 2021-11-24 ENCOUNTER — Ambulatory Visit (HOSPITAL_COMMUNITY)
Admission: RE | Admit: 2021-11-24 | Discharge: 2021-11-24 | Disposition: A | Discharge status: Home / Self Care | Payer: Medicare Other | Source: Ambulatory Visit | Attending: Cardiology | Admitting: Cardiology

## 2021-11-24 ENCOUNTER — Other Ambulatory Visit (HOSPITAL_COMMUNITY): Payer: Self-pay | Admitting: *Deleted

## 2021-11-24 DIAGNOSIS — Z4801 Encounter for change or removal of surgical wound dressing: Secondary | ICD-10-CM | POA: Diagnosis present

## 2021-11-24 DIAGNOSIS — Z7901 Long term (current) use of anticoagulants: Secondary | ICD-10-CM | POA: Diagnosis not present

## 2021-11-24 DIAGNOSIS — Z95811 Presence of heart assist device: Secondary | ICD-10-CM | POA: Diagnosis not present

## 2021-11-24 NOTE — Progress Notes (Signed)
Patient presents to clinic today for labs and drive line exit wound care alone.  Reports no problems with VAD equipment or concerns with drive line.    Exit Site Care: Existing VAD dressing removed and site care performed using sterile technique. Drive line exit site cleaned with Chlora prep applicators x 2, RINSED WITH SALINE, and allowed to dry. and Sorbaview dressing with Silverlon applied. Exit site healed and fully incorporated, the velour is fully implanted at exit site. No drainage, tenderness, or foul odor. Drive line anchor re-applied. Pt denies fever or chills.   Plan:  Return in 1 week fof dressing change and INR  Simmie Davies RN,BSN VAD Coordinator  Office: [phone]  24/7 Pager: [phone]

## 2021-12-02 ENCOUNTER — Ambulatory Visit (HOSPITAL_COMMUNITY): Payer: Self-pay

## 2021-12-02 ENCOUNTER — Other Ambulatory Visit (HOSPITAL_COMMUNITY): Payer: Self-pay | Admitting: Unknown Physician Specialty

## 2021-12-02 ENCOUNTER — Ambulatory Visit (HOSPITAL_COMMUNITY)
Admission: RE | Admit: 2021-12-02 | Discharge: 2021-12-02 | Disposition: A | Discharge status: Home / Self Care | Payer: Medicare Other | Source: Ambulatory Visit | Attending: Cardiology | Admitting: Cardiology

## 2021-12-02 DIAGNOSIS — Z7901 Long term (current) use of anticoagulants: Secondary | ICD-10-CM

## 2021-12-02 DIAGNOSIS — Z95811 Presence of heart assist device: Secondary | ICD-10-CM

## 2021-12-02 LAB — PROTIME-INR
INR: 2.7 — ABNORMAL HIGH (ref 0.8–1.2)
Prothrombin Time: 28 s — ABNORMAL HIGH (ref 11.4–15.2)

## 2021-12-02 NOTE — Progress Notes (Signed)
LVAD INR 

## 2021-12-02 NOTE — Progress Notes (Signed)
Patient presents to clinic today for labs and drive line exit wound care alone.  Reports no problems with VAD equipment or concerns with drive line.    Exit Site Care: Existing VAD dressing removed and site care performed using sterile technique. Drive line exit site cleaned with Chlora prep applicators x 2, RINSED WITH SALINE, and allowed to dry. and Sorbaview dressing with Silverlon applied. Exit site healed and fully incorporated, the velour is fully implanted at exit site. No drainage, tenderness, or foul odor. Drive line anchor re-applied. Pt denies fever or chills.   Plan:  Return in 1 week fo dressing change and INR  [name] RN,BSN VAD Coordinator  Office: [phone]  24/7 Pager: [phone]

## 2021-12-02 NOTE — Patient Instructions (Signed)
Description   INR > goal. Instructed to take 3 mg today, then continue 3 mg Tues/Thurs and 6 mg AOD.  Checks every 2 weeks in clinic with dressing changes

## 2021-12-07 ENCOUNTER — Other Ambulatory Visit (HOSPITAL_COMMUNITY): Payer: Self-pay | Admitting: Unknown Physician Specialty

## 2021-12-07 DIAGNOSIS — Z95811 Presence of heart assist device: Secondary | ICD-10-CM

## 2021-12-07 DIAGNOSIS — Z7901 Long term (current) use of anticoagulants: Secondary | ICD-10-CM

## 2021-12-12 ENCOUNTER — Other Ambulatory Visit (HOSPITAL_COMMUNITY): Payer: Medicare Other

## 2021-12-23 ENCOUNTER — Other Ambulatory Visit (HOSPITAL_COMMUNITY): Payer: Self-pay

## 2021-12-23 DIAGNOSIS — Z95811 Presence of heart assist device: Secondary | ICD-10-CM

## 2021-12-23 DIAGNOSIS — Z7901 Long term (current) use of anticoagulants: Secondary | ICD-10-CM

## 2021-12-26 ENCOUNTER — Ambulatory Visit (HOSPITAL_COMMUNITY)
Admission: RE | Admit: 2021-12-26 | Discharge: 2021-12-26 | Disposition: A | Discharge status: Home / Self Care | Payer: Medicare Other | Source: Ambulatory Visit | Attending: Physician Assistant | Admitting: Physician Assistant

## 2021-12-26 ENCOUNTER — Ambulatory Visit (HOSPITAL_COMMUNITY): Payer: Self-pay | Admitting: Pharmacist

## 2021-12-26 DIAGNOSIS — Z7901 Long term (current) use of anticoagulants: Secondary | ICD-10-CM | POA: Diagnosis not present

## 2021-12-26 DIAGNOSIS — Z95811 Presence of heart assist device: Secondary | ICD-10-CM | POA: Diagnosis present

## 2021-12-26 LAB — PROTIME-INR
INR: 2.8 — ABNORMAL HIGH (ref 0.8–1.2)
Prothrombin Time: 29.1 seconds — ABNORMAL HIGH (ref 11.4–15.2)

## 2021-12-26 NOTE — Progress Notes (Signed)
Patient presents to clinic today for labs and drive line exit wound care alone.  Reports no problems with VAD equipment or concerns with drive line.    Exit Site Care: Existing VAD dressing removed and site care performed using sterile technique. Drive line exit site cleaned with Chlora prep applicators x 2, RINSED WITH SALINE, and allowed to dry. and Sorbaview dressing with Silverlon applied. Exit site healed and fully incorporated, the velour is fully implanted at exit site. No drainage, tenderness, or foul odor. Drive line anchor re-applied. Pt denies fever or chills.   Plan:  Return in 1 week for full visit with DR Bensimhon and dressing change. Coumadin dosing per Haynes Kerns RN,BSN VAD Coordinator  Office: [phone]  24/7 Pager: [phone]

## 2021-12-30 ENCOUNTER — Encounter (HOSPITAL_COMMUNITY): Payer: Medicare Other

## 2022-01-04 ENCOUNTER — Other Ambulatory Visit (HOSPITAL_COMMUNITY): Payer: Self-pay | Admitting: *Deleted

## 2022-01-04 DIAGNOSIS — Z7901 Long term (current) use of anticoagulants: Secondary | ICD-10-CM

## 2022-01-04 DIAGNOSIS — Z95811 Presence of heart assist device: Secondary | ICD-10-CM

## 2022-01-06 ENCOUNTER — Ambulatory Visit (HOSPITAL_COMMUNITY): Payer: Self-pay | Admitting: Pharmacist

## 2022-01-06 ENCOUNTER — Encounter (HOSPITAL_COMMUNITY): Payer: Self-pay | Admitting: Internal Medicine

## 2022-01-06 ENCOUNTER — Ambulatory Visit (HOSPITAL_COMMUNITY)
Admission: RE | Admit: 2022-01-06 | Discharge: 2022-01-06 | Disposition: A | Discharge status: Home / Self Care | Payer: Medicare Other | Source: Ambulatory Visit | Attending: Internal Medicine | Admitting: Internal Medicine

## 2022-01-06 ENCOUNTER — Encounter (HOSPITAL_COMMUNITY): Payer: Medicare Other

## 2022-01-06 DIAGNOSIS — I5022 Chronic systolic (congestive) heart failure: Secondary | ICD-10-CM | POA: Diagnosis not present

## 2022-01-06 DIAGNOSIS — Z7901 Long term (current) use of anticoagulants: Secondary | ICD-10-CM | POA: Diagnosis not present

## 2022-01-06 DIAGNOSIS — Z87891 Personal history of nicotine dependence: Secondary | ICD-10-CM | POA: Diagnosis not present

## 2022-01-06 DIAGNOSIS — N1831 Chronic kidney disease, stage 3a: Secondary | ICD-10-CM | POA: Insufficient documentation

## 2022-01-06 DIAGNOSIS — E1169 Type 2 diabetes mellitus with other specified complication: Secondary | ICD-10-CM

## 2022-01-06 DIAGNOSIS — Z8249 Family history of ischemic heart disease and other diseases of the circulatory system: Secondary | ICD-10-CM | POA: Diagnosis not present

## 2022-01-06 DIAGNOSIS — I130 Hypertensive heart and chronic kidney disease with heart failure and stage 1 through stage 4 chronic kidney disease, or unspecified chronic kidney disease: Secondary | ICD-10-CM | POA: Insufficient documentation

## 2022-01-06 DIAGNOSIS — I4892 Unspecified atrial flutter: Secondary | ICD-10-CM | POA: Insufficient documentation

## 2022-01-06 DIAGNOSIS — Z95811 Presence of heart assist device: Secondary | ICD-10-CM | POA: Insufficient documentation

## 2022-01-06 DIAGNOSIS — Z452 Encounter for adjustment and management of vascular access device: Secondary | ICD-10-CM | POA: Insufficient documentation

## 2022-01-06 DIAGNOSIS — E039 Hypothyroidism, unspecified: Secondary | ICD-10-CM | POA: Insufficient documentation

## 2022-01-06 DIAGNOSIS — Z7984 Long term (current) use of oral hypoglycemic drugs: Secondary | ICD-10-CM | POA: Insufficient documentation

## 2022-01-06 DIAGNOSIS — E1122 Type 2 diabetes mellitus with diabetic chronic kidney disease: Secondary | ICD-10-CM | POA: Insufficient documentation

## 2022-01-06 DIAGNOSIS — I5042 Chronic combined systolic (congestive) and diastolic (congestive) heart failure: Secondary | ICD-10-CM | POA: Diagnosis present

## 2022-01-06 DIAGNOSIS — I428 Other cardiomyopathies: Secondary | ICD-10-CM | POA: Insufficient documentation

## 2022-01-06 DIAGNOSIS — Z7982 Long term (current) use of aspirin: Secondary | ICD-10-CM | POA: Diagnosis not present

## 2022-01-06 DIAGNOSIS — N183 Chronic kidney disease, stage 3 unspecified: Secondary | ICD-10-CM | POA: Diagnosis not present

## 2022-01-06 LAB — BASIC METABOLIC PANEL
Anion gap: 8 (ref 5–15)
BUN: 24 mg/dL — ABNORMAL HIGH (ref 6–20)
CO2: 25 mmol/L (ref 22–32)
Calcium: 9.1 mg/dL (ref 8.9–10.3)
Chloride: 102 mmol/L (ref 98–111)
Creatinine, Ser: 1.47 mg/dL — ABNORMAL HIGH (ref 0.61–1.24)
GFR, Estimated: 57 mL/min — ABNORMAL LOW (ref >60)
Glucose, Bld: 311 mg/dL — ABNORMAL HIGH (ref 70–99)
Potassium: 4.5 mmol/L (ref 3.5–5.1)
Sodium: 135 mmol/L (ref 135–145)

## 2022-01-06 LAB — CBC
HCT: 44.3 % (ref 39.0–52.0)
Hemoglobin: 14.1 g/dL (ref 13.0–17.0)
MCH: 27.7 pg (ref 26.0–34.0)
MCHC: 31.8 g/dL (ref 30.0–36.0)
MCV: 87 fL (ref 80.0–100.0)
Platelets: 238 10*3/uL (ref 150–400)
RBC: 5.09 MIL/uL (ref 4.22–5.81)
RDW: 15.9 % — ABNORMAL HIGH (ref 11.5–15.5)
WBC: 7 10*3/uL (ref 4.0–10.5)
nRBC: 0 % (ref 0.0–0.2)

## 2022-01-06 LAB — PROTIME-INR
INR: 2.5 — ABNORMAL HIGH (ref 0.8–1.2)
Prothrombin Time: 26.5 seconds — ABNORMAL HIGH (ref 11.4–15.2)

## 2022-01-06 LAB — LACTATE DEHYDROGENASE: LDH: 274 U/L — ABNORMAL HIGH (ref 98–192)

## 2022-01-06 MED ORDER — GLIPIZIDE 10 MG PO TABS: 10.0000 mg | ORAL_TABLET | Freq: Two times a day (BID) | ORAL | 3 refills | Status: DC

## 2022-01-06 MED ORDER — AMLODIPINE BESYLATE 5 MG PO TABS: 10.0000 mg | ORAL_TABLET | Freq: Every day | ORAL | 3 refills | Status: DC

## 2022-01-06 NOTE — Progress Notes (Signed)
LVAD Clinic Note   Primary Cardiologist: Dr. Gala Romney   HPI: Benjamin Sherman is a 51 y.o. 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 ECHO showed severely reduced EF. 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 Placed on steroids without benefit.  Admitted 11/18 after fall/syncopal episode resulting in bifrontal SAH. Neurosurgery consulted.   Admitted 4/19 with cardiogenic shock in setting of AFL. Supported with inotropes and underwent DC-CV but had persistent shock. After numerous discussions about concern for adequate social support underwent placement of HM-3 LVAD on 05/15/17. D/c home 06/04/17  Here for routine f/u. Feels great. Active with golf. No SOB. Denies orthopnea or PND. No fevers, chills or problems with driveline. No bleeding, melena or neuro symptoms. No VAD alarms. Taking all meds as prescribed.    VAD Indication: Destination Therapy - due to limited social support.    LVAD assessment:                                                    Speed: 5300 RPM                                                       Flow: 3.9                                                                      Power: 4.0 w                               PI: 7.7 Alarms: none  Events:  10-30 PI events daily  Hct: 20                                                              Fixed speed: 5300 Low speed limit: 5000   Primary Controller:  Replace back up battery in 6 months.  Back up controller:  Replace back up battery in 27 months.   I reviewed the LVAD parameters from today and compared the results to the patient's prior recorded data. LVAD interrogation was STABLE for significant power changes, NEGATIVE for clinical alarms and STABLE for PI events/speed drops. No programming changes were made and pump is functioning within  specified parameters.    LVAD equipment check completed and is in good working order. Back-up equipment present.    Annual maintenance completed on patient's home equipment per [facility] 04/28/21.      Past Medical History:  Diagnosis Date   CHF (congestive heart failure) (HCC)    Diabetes mellitus without complication (HCC)  Current Outpatient Medications  Medication Sig Dispense Refill   aspirin 81 MG EC tablet Take 1 tablet (81 mg total) by mouth daily. 90 tablet 3   Blood Glucose Monitoring Suppl (TRUE METRIX METER) DEVI 1 each by Does not apply route daily with breakfast. 1 Device 0   glucose blood (TRUE METRIX BLOOD GLUCOSE TEST) test strip Use daily before breakfast 30 each 12   pantoprazole (PROTONIX) 40 MG tablet TAKE 1 TABLET BY MOUTH DAILY. 90 tablet 3   sacubitril-valsartan (ENTRESTO) 49-51 MG Take 1 tablet by mouth 2 (two) times daily. 180 tablet 3   TRUEPLUS LANCETS 28G MISC 1 each by Does not apply route daily. 30 each 12   warfarin (COUMADIN) 6 MG tablet TAKE 1/2 tablet (3 mg) Tues/Thurs and 1 tablet (6mg ) all other days; or as directed by HF clinic 90 tablet 5   amLODipine (NORVASC) 5 MG tablet Take 2 tablets (10 mg total) by mouth daily. 180 tablet 3   glipiZIDE (GLUCOTROL) 10 MG tablet Take 1 tablet (10 mg total) by mouth 2 (two) times daily before a meal. 180 tablet 3   No current facility-administered medications for this encounter.   Allergies  Allergen Reactions   Bee Venom     UNSPECIFIED REACTION    Social History   Socioeconomic History   Marital status: Single    Spouse name: Not on file   Number of children: Not on file   Years of education: Not on file   Highest education level: Not on file  Occupational History   Occupation: security  Tobacco Use   Smoking status: Former    Types: Cigars    Quit date: 11/16/2016    Years since quitting: 5.1   Smokeless tobacco: Never   Tobacco comments:    6 cigars per wk  Vaping Use   Vaping Use: Never  used  Substance and Sexual Activity   Alcohol use: Yes    Alcohol/week: 4.0 standard drinks of alcohol    Types: 2 Shots of liquor, 2 Cans of beer per week    Comment: ocasional   Drug use: No   Sexual activity: Not Currently  Other Topics Concern   Not on file  Social History Narrative   Not on file   Social Determinants of Health   Financial Resource Strain: Low Risk  (03/25/2020)   Overall Financial Resource Strain (CARDIA)    Difficulty of Paying Living Expenses: Not very hard  Food Insecurity: No Food Insecurity (03/25/2020)   Hunger Vital Sign    Worried About Running Out of Food in the Last Year: Never true    Ran Out of Food in the Last Year: Never true  Transportation Needs: No Transportation Needs (03/25/2020)   PRAPARE - 05/25/2020 (Medical): No    Lack of Transportation (Non-Medical): No  Physical Activity: Not on file  Stress: Not on file  Social Connections: Not on file  Intimate Partner Violence: Not on file    Family History  Problem Relation Age of Onset   CAD Father    Vitals:   01/06/22 0931  BP: 105/84  Pulse: 87  SpO2: 97%  Weight: 119.4 kg (263 lb 3.2 oz)    Wt Readings from Last 3 Encounters:  01/06/22 119.4 kg (263 lb 3.2 oz)  11/02/21 119.2 kg (262 lb 12.8 oz)  09/02/21 111.8 kg (246 lb 6.4 oz)      Vital Signs:  Temp: Doppler Pressure: 130 does not correlate with MAP or modified systolic  Automatc BP: 105/84 (92)       HR: 84 SPO2: 97% on RA                                                         Weight: 263.2 lb w/ eqt  Last weight: 262.8 lb w/ ept  Physical Exam: General:  NAD.  HEENT: normal  Neck: supple. JVP not elevated.  Carotids 2+ bilat; no bruits. No lymphadenopathy or thryomegaly appreciated. Cor: LVAD hum.  Lungs: Clear. Abdomen: obese soft, nontender, non-distended. No hepatosplenomegaly. No bruits or masses. Good bowel  sounds. Driveline site clean. Anchor in place.  Extremities: no cyanosis, clubbing, rash. Warm no edema  Neuro: alert & oriented x 3. No focal deficits. Moves all 4 without problem   ASSESSMENT & PLAN:  1. Chronic Combined Systolic/Diastolic Heart Failure. NICM  - ECHO 4/19 EF 20% CMRI EF 22%  - s/p HM-3 LVAD on 05/15/17 - Doing very well with VAD support. Remains NYHA I  - Volume status ok  2. VAD management.  - VAD interrogated personally. Parameters stable.. - Continues with frequent PI events. Encouraged fluid intake - Remains on ASA 81 mg daily + coumadin. - INR 2.5 INR goal 2.0-2.5  Discussed dosing with PharmD personally. 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 Off amio  - Continue coumadin  4. HTN -Blood pressure well controlled. Continue current regimen.  5. DMII - Sugars remain elevated but improving. HgbA1c down to 7.6 - Doubt he will tolerate SGLT2i with volume issues.  - Continue glipizide 10 bid. Unable to tolerate metformin due to diarrhea - Referred to Endo but he did not go  6. CKD 3a - Creatinine baseline 1.5-1.8 - Scr 1.47 today  7. Golf game - says he needs a new set of clubs for Christmas   Total time spent 35 minutes. Over half that time spent discussing above.    [name], MD 01/06/22

## 2022-01-06 NOTE — Progress Notes (Signed)
Patient presents for 2 month follow up in VAD Clinic today alone. Reports no problems with VAD equipment or concerns with drive line.   Pt states that he is feeling good and playing golf. He started back walking around his neighborhood this week. Denies lightheadedness, dizziness, falls, heart failure symptoms, and signs of bleeding.  Pt no-showed endocrinology appt in September. Advised he needs to call and reschedule missed appt. Provided with office contact information on AVS.    Reports he is taking medications as prescribed. Reports he is checking his blood sugars at home, but not consistently.   Vital Signs:        Temp: Doppler Pressure: 130 does not correlate with MAP or modified systolic  Automatc BP: 105/84 (92)  HR: 84 SPO2: 97% on RA        Weight: 263.2 lb w/ eqt  Last weight: 262.8 lb w/ ept   VAD Indication: Destination Therapy - due to limited social support.    LVAD assessment:      Speed: 5300 RPM      Flow: 3.9       Power: 4.0 w       PI: 7.7 Alarms: none  Events:  10-30 PI events daily  Hct: 20       Fixed speed: 5300 Low speed limit: 5000  Primary Controller:  Replace back up battery in 6 months.  Back up controller:  Replace back up battery in 27 months.  I reviewed the LVAD parameters from today and compared the results to the patient's prior recorded data. LVAD interrogation was STABLE for significant power changes, NEGATIVE for clinical alarms and STABLE for PI events/speed drops. No programming changes were made and pump is functioning within specified parameters.    LVAD equipment check completed and is in good working order. Back-up equipment present.   Annual maintenance completed on patient's home equipment per [facility] 04/28/21.  Exit Site Care:  Existing VAD dressing removed and site care performed using sterile technique. Drive line exit site cleaned with Chlora prep applicators x 2, allowed to dry, and biopatch w/ Sorbaview dressing applied.  Exit site healed and incorporated, the velour is fully implanted at exit site. No drainage, redness, tenderness, foul odor noted. Drive line anchor repositioned and re-applied. Pt denies fever or chills. Pt given extra anchors for at home use as he is replacing them frequently due to activity.  Device:  N/A   BP & Labs:  Doppler 130 - does not correlate with MAP or modified systolic at this visit   Hgb 14.1 - No S/S of bleeding. Specifically denies melena/BRBPR or nosebleeds.   LDH 274 - established baseline of 200 - 400. Denies tea-colored urine. No power elevations noted on interrogation other than above noted incidents.   Patient Instructions:  No medication changes today Coumadin dosing per Lauren PharmD Return to clinic in 2 months Your diabetes health is very important. Please call the endocrinology office to reschedule your missed appointment.  [facility] [address] [place], [place], [place] [address] [phone]  Simmie Davies RN,BSN VAD Coordinator  Office: [phone]  24/7 Pager: [phone]

## 2022-01-06 NOTE — Patient Instructions (Signed)
No medication changes today Coumadin dosing per Lauren PharmD Return to clinic in 2 months Your diabetes health is very important. Please call the endocrinology office to reschedule your missed appointment.  [facility] [address] [place], [place], [place] [address] [phone]

## 2022-01-12 ENCOUNTER — Other Ambulatory Visit (HOSPITAL_COMMUNITY): Payer: Self-pay | Admitting: *Deleted

## 2022-01-12 DIAGNOSIS — Z7901 Long term (current) use of anticoagulants: Secondary | ICD-10-CM

## 2022-01-12 DIAGNOSIS — Z95811 Presence of heart assist device: Secondary | ICD-10-CM

## 2022-01-13 ENCOUNTER — Other Ambulatory Visit (HOSPITAL_COMMUNITY): Payer: Medicare Other

## 2022-01-17 ENCOUNTER — Other Ambulatory Visit (HOSPITAL_COMMUNITY): Payer: Self-pay | Admitting: Internal Medicine

## 2022-01-17 DIAGNOSIS — Z95811 Presence of heart assist device: Secondary | ICD-10-CM

## 2022-01-17 DIAGNOSIS — Z7901 Long term (current) use of anticoagulants: Secondary | ICD-10-CM

## 2022-01-20 ENCOUNTER — Ambulatory Visit (HOSPITAL_COMMUNITY): Payer: Self-pay | Admitting: Pharmacist

## 2022-01-20 ENCOUNTER — Ambulatory Visit (HOSPITAL_COMMUNITY)
Admission: RE | Admit: 2022-01-20 | Discharge: 2022-01-20 | Disposition: A | Discharge status: Home / Self Care | Payer: Medicare Other | Source: Ambulatory Visit | Attending: Cardiology | Admitting: Cardiology

## 2022-01-20 DIAGNOSIS — Z95811 Presence of heart assist device: Secondary | ICD-10-CM | POA: Diagnosis present

## 2022-01-20 DIAGNOSIS — Z7901 Long term (current) use of anticoagulants: Secondary | ICD-10-CM | POA: Insufficient documentation

## 2022-01-20 LAB — PROTIME-INR
INR: 2.6 — ABNORMAL HIGH (ref 0.8–1.2)
Prothrombin Time: 27.4 seconds — ABNORMAL HIGH (ref 11.4–15.2)

## 2022-01-20 NOTE — Progress Notes (Signed)
Patient presents to clinic today for labs and drive line exit wound care alone.  Reports no problems with VAD equipment or concerns with drive line.    Exit Site Care: Existing VAD dressing removed and site care performed using sterile technique. Drive line exit site cleaned with Chlora prep applicators x 2, RINSED WITH SALINE, and allowed to dry. and Sorbaview dressing with Silverlon applied. Exit site healed and fully incorporated, the velour is fully implanted at exit site. No drainage, tenderness, or foul odor. Cath grip drive line anchor re-applied. Pt denies fever or chills.   Plan:  Return in 1 week for dressing change. Coumadin dosing per Avelina Laine RN,BSN VAD Coordinator  Office: [phone]  24/7 Pager: [phone]

## 2022-01-25 ENCOUNTER — Other Ambulatory Visit (HOSPITAL_COMMUNITY): Payer: Self-pay | Admitting: *Deleted

## 2022-01-25 DIAGNOSIS — Z7901 Long term (current) use of anticoagulants: Secondary | ICD-10-CM

## 2022-01-25 DIAGNOSIS — Z95811 Presence of heart assist device: Secondary | ICD-10-CM

## 2022-01-27 ENCOUNTER — Other Ambulatory Visit (HOSPITAL_COMMUNITY): Payer: Medicare Other

## 2022-02-03 ENCOUNTER — Ambulatory Visit (HOSPITAL_COMMUNITY): Payer: Self-pay | Admitting: Pharmacist

## 2022-02-03 ENCOUNTER — Ambulatory Visit (HOSPITAL_COMMUNITY)
Admission: RE | Admit: 2022-02-03 | Discharge: 2022-02-03 | Disposition: A | Discharge status: Home / Self Care | Payer: Medicare Other | Source: Ambulatory Visit | Attending: Cardiology | Admitting: Cardiology

## 2022-02-03 DIAGNOSIS — Z7901 Long term (current) use of anticoagulants: Secondary | ICD-10-CM | POA: Diagnosis not present

## 2022-02-03 DIAGNOSIS — Z95811 Presence of heart assist device: Secondary | ICD-10-CM | POA: Diagnosis present

## 2022-02-03 LAB — PROTIME-INR
INR: 2.1 — ABNORMAL HIGH (ref 0.8–1.2)
Prothrombin Time: 23 seconds — ABNORMAL HIGH (ref 11.4–15.2)

## 2022-02-03 NOTE — Progress Notes (Signed)
Patient presents to clinic today for labs and drive line exit wound care alone.  Reports no problems with VAD equipment or concerns with drive line.    Exit Site Care: Existing VAD dressing removed and site care performed using sterile technique. Drive line exit site cleaned with Chlora prep applicators x 2, RINSED WITH SALINE, and allowed to dry. and Sorbaview dressing with Silverlon applied along with 2 large tegaderms. Exit site healed and fully incorporated, the velour is fully implanted at exit site. No drainage, tenderness, or foul odor. Cath grip drive line anchor re-applied. Pt denies fever or chills.   Plan:  Return in 10 days for dressing change. Coumadin dosing per Gertie Gowda RN,BSN VAD Coordinator  Office: [phone]  24/7 Pager: [phone]

## 2022-02-14 ENCOUNTER — Other Ambulatory Visit (HOSPITAL_COMMUNITY): Payer: Self-pay | Admitting: Unknown Physician Specialty

## 2022-02-14 DIAGNOSIS — Z95811 Presence of heart assist device: Secondary | ICD-10-CM

## 2022-02-14 DIAGNOSIS — Z7901 Long term (current) use of anticoagulants: Secondary | ICD-10-CM

## 2022-02-15 ENCOUNTER — Other Ambulatory Visit (HOSPITAL_COMMUNITY): Payer: Medicare Other

## 2022-02-17 ENCOUNTER — Ambulatory Visit (HOSPITAL_COMMUNITY): Payer: Self-pay | Admitting: Pharmacist

## 2022-02-17 ENCOUNTER — Ambulatory Visit (HOSPITAL_COMMUNITY)
Admission: RE | Admit: 2022-02-17 | Discharge: 2022-02-17 | Disposition: A | Discharge status: Home / Self Care | Payer: Medicare Other | Source: Ambulatory Visit | Attending: Internal Medicine | Admitting: Internal Medicine

## 2022-02-17 DIAGNOSIS — Z7901 Long term (current) use of anticoagulants: Secondary | ICD-10-CM | POA: Insufficient documentation

## 2022-02-17 DIAGNOSIS — Z95811 Presence of heart assist device: Secondary | ICD-10-CM | POA: Diagnosis not present

## 2022-02-17 DIAGNOSIS — Z4801 Encounter for change or removal of surgical wound dressing: Secondary | ICD-10-CM | POA: Diagnosis present

## 2022-02-17 LAB — PROTIME-INR
INR: 2.2 — ABNORMAL HIGH (ref 0.8–1.2)
Prothrombin Time: 23.9 seconds — ABNORMAL HIGH (ref 11.4–15.2)

## 2022-02-17 NOTE — Progress Notes (Signed)
Patient presents to clinic today for labs and drive line exit wound care alone.  Reports no problems with VAD equipment or concerns with drive line.    Exit Site Care: Existing VAD dressing removed and site care performed using sterile technique. Drive line exit site cleaned with Chlora prep applicators x 2, RINSED WITH SALINE, and allowed to dry. and Sorbaview dressing with Silverlon applied along with 2 large tegaderms. Exit site healed and fully incorporated, the velour is fully implanted at exit site. No drainage, tenderness, or foul odor. Cath grip drive line anchor re-applied. Pt denies fever or chills. Provided with 4 weekly kits for home use.   Plan:  Return in 10 days for dressing change. Coumadin dosing per Doylene Canning RN [place] Coordinator  Office: [phone]  24/7 Pager: [phone]

## 2022-02-24 ENCOUNTER — Other Ambulatory Visit (HOSPITAL_COMMUNITY): Payer: Self-pay | Admitting: Unknown Physician Specialty

## 2022-02-24 ENCOUNTER — Other Ambulatory Visit (HOSPITAL_COMMUNITY): Payer: Medicare Other

## 2022-02-24 DIAGNOSIS — Z95811 Presence of heart assist device: Secondary | ICD-10-CM

## 2022-02-24 DIAGNOSIS — Z7901 Long term (current) use of anticoagulants: Secondary | ICD-10-CM

## 2022-03-03 ENCOUNTER — Ambulatory Visit (HOSPITAL_COMMUNITY)
Admission: RE | Admit: 2022-03-03 | Discharge: 2022-03-03 | Disposition: A | Discharge status: Home / Self Care | Payer: Medicare Other | Source: Ambulatory Visit | Attending: Internal Medicine | Admitting: Internal Medicine

## 2022-03-03 ENCOUNTER — Ambulatory Visit (HOSPITAL_COMMUNITY): Payer: Self-pay | Admitting: Pharmacist

## 2022-03-03 DIAGNOSIS — Z95811 Presence of heart assist device: Secondary | ICD-10-CM

## 2022-03-03 DIAGNOSIS — Z7901 Long term (current) use of anticoagulants: Secondary | ICD-10-CM | POA: Diagnosis not present

## 2022-03-03 LAB — PROTIME-INR
INR: 2.4 — ABNORMAL HIGH (ref 0.8–1.2)
Prothrombin Time: 25.9 seconds — ABNORMAL HIGH (ref 11.4–15.2)

## 2022-03-03 NOTE — Progress Notes (Signed)
Patient presents to clinic today for labs and drive line exit wound care alone.  Reports no problems with VAD equipment or concerns with drive line.    Exit Site Care: Existing VAD dressing removed and site care performed using sterile technique. Drive line exit site cleaned with Chlora prep applicators x 2, RINSED WITH SALINE, and allowed to dry. and Sorbaview dressing with Silverlon applied along with 2 large tegaderms. Exit site healed and fully incorporated, the velour is fully implanted at exit site. No drainage, tenderness, or foul odor. Cath grip drive line anchor re-applied. Pt denies fever or chills. Pt has sufficient weekly kits for home use.   Plan:  Return in 10 days for dressing change. Coumadin dosing per Gertie Gowda RN [place] Coordinator  Office: [phone]  24/7 Pager: [phone]

## 2022-03-10 ENCOUNTER — Other Ambulatory Visit (HOSPITAL_COMMUNITY): Payer: Medicare Other

## 2022-03-10 ENCOUNTER — Other Ambulatory Visit (HOSPITAL_COMMUNITY): Payer: Self-pay | Admitting: *Deleted

## 2022-03-10 DIAGNOSIS — Z7901 Long term (current) use of anticoagulants: Secondary | ICD-10-CM

## 2022-03-10 DIAGNOSIS — Z95811 Presence of heart assist device: Secondary | ICD-10-CM

## 2022-03-17 ENCOUNTER — Ambulatory Visit (HOSPITAL_COMMUNITY): Payer: Self-pay | Admitting: Pharmacist

## 2022-03-17 ENCOUNTER — Ambulatory Visit (HOSPITAL_COMMUNITY)
Admission: RE | Admit: 2022-03-17 | Discharge: 2022-03-17 | Disposition: A | Discharge status: Home / Self Care | Payer: Medicare Other | Source: Ambulatory Visit | Attending: Cardiology | Admitting: Cardiology

## 2022-03-17 DIAGNOSIS — Z95811 Presence of heart assist device: Secondary | ICD-10-CM | POA: Diagnosis not present

## 2022-03-17 DIAGNOSIS — Z7901 Long term (current) use of anticoagulants: Secondary | ICD-10-CM | POA: Diagnosis not present

## 2022-03-17 LAB — PROTIME-INR
INR: 2.1 — ABNORMAL HIGH (ref 0.8–1.2)
Prothrombin Time: 23.6 seconds — ABNORMAL HIGH (ref 11.4–15.2)

## 2022-03-17 NOTE — Progress Notes (Signed)
Patient presents to clinic today for labs and drive line exit wound care alone.  Reports no problems with VAD equipment or concerns with drive line.    Exit Site Care: Existing VAD dressing removed and site care performed using sterile technique. Drive line exit site cleaned with Chlora prep applicators x 2, RINSED WITH SALINE, and allowed to dry. and Sorbaview dressing with Silverlon applied along with 2 large tegaderms. Exit site healed and fully incorporated, the velour is fully implanted at exit site. No drainage, tenderness, or foul odor. Drive line anchor re-applied. Pt denies fever or chills. Pt has sufficient weekly kits for home use.   Plan:  Return in 1 week for dressing change. Coumadin dosing per Gertie Gowda RN [place] Coordinator  Office: [phone]  24/7 Pager: [phone]

## 2022-03-23 ENCOUNTER — Other Ambulatory Visit (HOSPITAL_COMMUNITY): Payer: Self-pay | Admitting: *Deleted

## 2022-03-23 DIAGNOSIS — Z7901 Long term (current) use of anticoagulants: Secondary | ICD-10-CM

## 2022-03-23 DIAGNOSIS — Z95811 Presence of heart assist device: Secondary | ICD-10-CM

## 2022-03-24 ENCOUNTER — Other Ambulatory Visit (HOSPITAL_COMMUNITY): Payer: Medicare Other

## 2022-03-31 ENCOUNTER — Ambulatory Visit (HOSPITAL_COMMUNITY)
Admission: RE | Admit: 2022-03-31 | Discharge: 2022-03-31 | Disposition: A | Discharge status: Home / Self Care | Payer: Medicare Other | Source: Ambulatory Visit | Attending: Cardiology | Admitting: Cardiology

## 2022-03-31 ENCOUNTER — Ambulatory Visit (HOSPITAL_COMMUNITY): Payer: Self-pay | Admitting: Student-PharmD

## 2022-03-31 DIAGNOSIS — Z95811 Presence of heart assist device: Secondary | ICD-10-CM | POA: Diagnosis not present

## 2022-03-31 DIAGNOSIS — Z7901 Long term (current) use of anticoagulants: Secondary | ICD-10-CM | POA: Insufficient documentation

## 2022-03-31 DIAGNOSIS — Z4801 Encounter for change or removal of surgical wound dressing: Secondary | ICD-10-CM | POA: Diagnosis present

## 2022-03-31 LAB — PROTIME-INR
INR: 1.6 — ABNORMAL HIGH (ref 0.8–1.2)
Prothrombin Time: 18.7 seconds — ABNORMAL HIGH (ref 11.4–15.2)

## 2022-03-31 NOTE — Progress Notes (Signed)
Patient presents to clinic today for INR and drive line exit wound care alone.  Reports no problems with VAD equipment or concerns with drive line.    Exit Site Care: Existing VAD dressing removed and site care performed using sterile technique. Drive line exit site cleaned with Chlora prep applicators x 2, RINSED WITH SALINE, and allowed to dry. and Sorbaview dressing with Silverlon applied along with 2 large tegaderms. Exit site healed and fully incorporated, the velour is fully implanted at exit site. No drainage, tenderness, or foul odor. Drive line anchor re-applied. Pt denies fever or chills. Pt has sufficient weekly kits for home use.   Plan:  Return in 1 week for dressing change. Coumadin dosing per Doylene Canning RN [place] Coordinator  Office: [phone]  24/7 Pager: [phone]

## 2022-04-05 ENCOUNTER — Other Ambulatory Visit (HOSPITAL_COMMUNITY): Payer: Medicare Other

## 2022-04-05 ENCOUNTER — Telehealth (HOSPITAL_COMMUNITY): Payer: Self-pay

## 2022-04-05 NOTE — Telephone Encounter (Signed)
Pt no showed this morning for dressing change appointment. Called and left voicemail for pt to call VAD Clinic to reschedule.  Bobbye Morton RN, BSN VAD Coordinator 24/7 Pager [phone]

## 2022-04-06 ENCOUNTER — Other Ambulatory Visit (HOSPITAL_COMMUNITY): Payer: Self-pay | Admitting: Internal Medicine

## 2022-04-06 DIAGNOSIS — I5022 Chronic systolic (congestive) heart failure: Secondary | ICD-10-CM

## 2022-04-06 DIAGNOSIS — I10 Essential (primary) hypertension: Secondary | ICD-10-CM

## 2022-04-08 ENCOUNTER — Other Ambulatory Visit (HOSPITAL_COMMUNITY): Payer: Self-pay | Admitting: Internal Medicine

## 2022-04-08 DIAGNOSIS — I5022 Chronic systolic (congestive) heart failure: Secondary | ICD-10-CM

## 2022-04-08 DIAGNOSIS — I10 Essential (primary) hypertension: Secondary | ICD-10-CM

## 2022-04-10 ENCOUNTER — Other Ambulatory Visit (HOSPITAL_COMMUNITY): Payer: Self-pay

## 2022-04-10 DIAGNOSIS — I5022 Chronic systolic (congestive) heart failure: Secondary | ICD-10-CM

## 2022-04-10 DIAGNOSIS — I10 Essential (primary) hypertension: Secondary | ICD-10-CM

## 2022-04-10 MED ORDER — ENTRESTO 49-51 MG PO TABS: ORAL_TABLET | ORAL | 3 refills | Status: DC

## 2022-04-13 ENCOUNTER — Other Ambulatory Visit (HOSPITAL_COMMUNITY): Payer: Self-pay | Admitting: Unknown Physician Specialty

## 2022-04-13 DIAGNOSIS — Z7901 Long term (current) use of anticoagulants: Secondary | ICD-10-CM

## 2022-04-13 DIAGNOSIS — Z95811 Presence of heart assist device: Secondary | ICD-10-CM

## 2022-04-14 ENCOUNTER — Ambulatory Visit (HOSPITAL_COMMUNITY)
Admission: RE | Admit: 2022-04-14 | Discharge: 2022-04-14 | Disposition: A | Discharge status: Home / Self Care | Payer: Medicare Other | Source: Ambulatory Visit | Attending: Internal Medicine | Admitting: Internal Medicine

## 2022-04-14 ENCOUNTER — Telehealth (HOSPITAL_COMMUNITY): Payer: Self-pay

## 2022-04-14 ENCOUNTER — Encounter (HOSPITAL_COMMUNITY): Payer: Self-pay | Admitting: Internal Medicine

## 2022-04-14 ENCOUNTER — Ambulatory Visit (HOSPITAL_COMMUNITY): Payer: Self-pay | Admitting: Pharmacist

## 2022-04-14 VITALS — BP 107/66 | HR 83 | Wt 259.6 lb

## 2022-04-14 DIAGNOSIS — E1169 Type 2 diabetes mellitus with other specified complication: Secondary | ICD-10-CM

## 2022-04-14 DIAGNOSIS — I10 Essential (primary) hypertension: Secondary | ICD-10-CM

## 2022-04-14 DIAGNOSIS — Z7901 Long term (current) use of anticoagulants: Secondary | ICD-10-CM

## 2022-04-14 DIAGNOSIS — I5022 Chronic systolic (congestive) heart failure: Secondary | ICD-10-CM

## 2022-04-14 DIAGNOSIS — Z95811 Presence of heart assist device: Secondary | ICD-10-CM | POA: Diagnosis not present

## 2022-04-14 DIAGNOSIS — N1831 Chronic kidney disease, stage 3a: Secondary | ICD-10-CM

## 2022-04-14 LAB — CBC
HCT: 44.2 % (ref 39.0–52.0)
Hemoglobin: 14.3 g/dL (ref 13.0–17.0)
MCH: 27.7 pg (ref 26.0–34.0)
MCHC: 32.4 g/dL (ref 30.0–36.0)
MCV: 85.7 fL (ref 80.0–100.0)
Platelets: 221 10*3/uL (ref 150–400)
RBC: 5.16 MIL/uL (ref 4.22–5.81)
RDW: 15.9 % — ABNORMAL HIGH (ref 11.5–15.5)
WBC: 6.7 10*3/uL (ref 4.0–10.5)
nRBC: 0 % (ref 0.0–0.2)

## 2022-04-14 LAB — PROTIME-INR
INR: 2.1 — ABNORMAL HIGH (ref 0.8–1.2)
Prothrombin Time: 23 seconds — ABNORMAL HIGH (ref 11.4–15.2)

## 2022-04-14 MED ORDER — PANTOPRAZOLE SODIUM 40 MG PO TBEC: 40.0000 mg | DELAYED_RELEASE_TABLET | Freq: Every day | ORAL | 3 refills | Status: DC

## 2022-04-14 NOTE — Patient Instructions (Signed)
No medication changes today Coumadin dosing per Lauren PharmD Return to clinic in 2 months for follow up with Dr Haroldine Laws

## 2022-04-14 NOTE — Telephone Encounter (Signed)
Lab called and stated green tube is hemolyzed.  They cannot run the CMET, lactate dehydrogenase, or prealbumin.

## 2022-04-14 NOTE — Progress Notes (Signed)
Patient presents for 2 month follow up in [facility] today alone. Reports no problems with VAD equipment or concerns with drive line.   Pt states that he is feeling good and playing golf. He started back walking around the mall recently. Wt down 4 lbs today. Denies lightheadedness, dizziness, falls, heart failure symptoms, and signs of bleeding.  Reports he is taking medications as prescribed. Reports he is checking his blood sugars at home, but not consistently. He has not made follow up appt with endocrinology despite being advised to do so repeatedly.   Green top lab tube hemolyzed- unable to run CMET, LDH, and Prealbumin. Will attempt to recollect at next INR draw.   Vital Signs:        Temp: Doppler Pressure: 118  Automatc BP: 107/66 (83)  HR: 83 SPO2: 98% on RA        Weight: 259.6 lb w/ eqt  Last weight: 263.2 lb w/ ept   VAD Indication: Destination Therapy - due to limited social support.    LVAD assessment:      Speed: 5300 RPM      Flow: 3.9       Power: 4.0 w       PI: 7.7 Alarms: none  Events:  10-30 PI events daily  Hct: 20       Fixed speed: 5300 Low speed limit: 5000  Primary Controller: Expired. Replaced with [id number] Manufacture: 11/08/21 Expiration: 10/10/23. Replace back up battery in 31 months.  Back up controller:  Replace back up battery in 24 months.  I reviewed the LVAD parameters from today and compared the results to the patient's prior recorded data. LVAD interrogation was STABLE for significant power changes, NEGATIVE for clinical alarms and STABLE for PI events/speed drops. No programming changes were made and pump is functioning within specified parameters.    LVAD equipment check completed and is in good working order. Back-up equipment present.   Annual maintenance completed on patient's home equipment per [facility] 04/28/21.  Exit Site Care:  Existing VAD dressing removed and site care performed using sterile technique. Drive line exit site  cleaned with Chlora prep applicators x 2, allowed to dry, and silverlon patch w/ Sorbaview dressing applied. Exit site healed and incorporated, the velour is fully implanted at exit site. No drainage, redness, tenderness, foul odor noted. Drive line anchor repositioned and re-applied. Pt denies fever or chills. Pt has adequate dressing supplies at home.   Device:  N/A   BP & Labs:  Doppler 118 - reflecting modified systolic   Hgb 123456 - No S/S of bleeding. Specifically denies melena/BRBPR or nosebleeds.   LDH (lab hemolyzed) - established baseline of 200 - 400. Denies tea-colored urine. No power elevations noted on interrogation other than above noted incidents.    Batteries Manufacture Date: Number of uses: Re-calibration  03/09/20 107-296 Educated on how to perform calibration. He verbalized understanding.   Annual maintenance completed per [employer] on patient's home MPU and universal Charity fundraiser.    Backup system controller 11 volt battery charged during visit.   5 year Intermacs follow up completed including:  Quality of Life, KCCQ-12, and Neurocognitive trail making.   Pt completed 1475 feet during 6 minute walk.  Patient Goals: To continue to be able to play golf with his friends.   [place] Cardiomyopathy Questionnaire     04/14/2022    1:07 PM 11/02/2021    9:35 AM 04/28/2021   11:24 AM  KCCQ-12  1 a. Ability  to shower/bathe Not at all limited Not at all limited Not at all limited  1 b. Ability to walk 1 block Not at all limited Not at all limited Not at all limited  1 c. Ability to hurry/jog Not at all limited Not at all limited Not at all limited  2. Edema feet/ankles/legs Never over the past 2 weeks Never over the past 2 weeks Never over the past 2 weeks  3. Limited by fatigue Never over the past 2 weeks Never over the past 2 weeks Never over the past 2 weeks  4. Limited by dyspnea Less than once a week Never over the past 2 weeks Never over the past 2 weeks  5.  Sitting up / on 3+ pillows Never over the past 2 weeks Never over the past 2 weeks Never over the past 2 weeks  6. Limited enjoyment of life Moderately limited Moderately limited Moderately limited  7. Rest of life w/ symptoms Mostly dissatisfied Mostly dissatisfied Mostly dissatisfied  8 a. Participation in hobbies Did not limit at all Slightly limited Slightly limited  8 b. Participation in chores Did not limit at all Did not limit at all Did not limit at all  8 c. Visiting family/friends Did not limit at all Did not limit at all Did not limit at all     Patient Instructions:  No medication changes today Coumadin dosing per Lauren PharmD Return to clinic in 2 months for follow up with Dr Haroldine Laws  Emerson Monte RN [place] Coordinator  Office: [phone]  24/7 Pager: [phone]

## 2022-04-21 ENCOUNTER — Encounter (HOSPITAL_COMMUNITY): Payer: Medicare Other

## 2022-04-21 ENCOUNTER — Other Ambulatory Visit (HOSPITAL_COMMUNITY): Payer: Self-pay | Admitting: *Deleted

## 2022-04-21 ENCOUNTER — Telehealth (HOSPITAL_COMMUNITY): Payer: Self-pay

## 2022-04-21 ENCOUNTER — Other Ambulatory Visit (HOSPITAL_COMMUNITY): Payer: Self-pay

## 2022-04-21 DIAGNOSIS — I5022 Chronic systolic (congestive) heart failure: Secondary | ICD-10-CM

## 2022-04-21 DIAGNOSIS — Z7901 Long term (current) use of anticoagulants: Secondary | ICD-10-CM

## 2022-04-21 DIAGNOSIS — Z95811 Presence of heart assist device: Secondary | ICD-10-CM

## 2022-04-21 NOTE — Telephone Encounter (Signed)
VAD Coordinator called patient about rescheduling dressing change due to a no show for his appointment today and moving his appointment 5/31 to 1:00pm at the request of Dr.Bensimhon for scheduling purposes. Voicemail left with call back number.  Simmie Davies RN, BSN VAD Coordinator 24/7 Pager [phone]

## 2022-04-23 NOTE — Progress Notes (Signed)
LVAD Clinic Note   Primary Cardiologist: Dr. Gala Romney   HPI: Benjamin Sherman is a 52 y.o. 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 ECHO showed severely reduced EF. 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 Placed on steroids without benefit.  Admitted 11/18 after fall/syncopal episode resulting in bifrontal SAH. Neurosurgery consulted.   Admitted 4/19 with cardiogenic shock in setting of AFL. Supported with inotropes and underwent DC-CV but had persistent shock. After numerous discussions about concern for adequate social support underwent placement of HM-3 LVAD on 05/15/17. D/c home 06/04/17  Here for routine f/u. Feels great. Active. Playing golf. Denies CP or SOB. No edema. Denies orthopnea or PND. No fevers, chills or problems with driveline. No bleeding, melena or neuro symptoms. No VAD alarms. Taking all meds as prescribed.     VAD Indication: Destination Therapy - due to limited social support.    LVAD assessment:                                                    Speed: 5300 RPM                                                       Flow: 3.9                                                                      Power: 4.0 w                               PI: 7.7 Alarms: none  Events:  10-30 PI events daily  Hct: 20                                                              Fixed speed: 5300 Low speed limit: 5000   Primary Controller: Expired. Replaced with [id number] Manufacture: 11/08/21 Expiration: 10/10/23. Replace back up battery in 31 months.  Back up controller:  Replace back up battery in 24 months.      Past Medical History:  Diagnosis Date   CHF (congestive heart failure) (HCC)    Diabetes mellitus without complication (HCC)     Current Outpatient Medications  Medication Sig Dispense Refill    amLODipine (NORVASC) 5 MG tablet Take 2 tablets (10 mg total) by mouth daily. 180 tablet 3   aspirin 81 MG EC tablet Take 1 tablet (81 mg total) by mouth daily. 90 tablet 3   Blood Glucose Monitoring Suppl (TRUE METRIX METER) DEVI 1 each by Does not apply route  daily with breakfast. 1 Device 0   glipiZIDE (GLUCOTROL) 10 MG tablet Take 1 tablet (10 mg total) by mouth 2 (two) times daily before a meal. 180 tablet 3   glucose blood (TRUE METRIX BLOOD GLUCOSE TEST) test strip Use daily before breakfast 30 each 12   sacubitril-valsartan (ENTRESTO) 49-51 MG TAKE (1) TABLET BY MOUTH TWICE DAILY. 180 tablet 3   TRUEPLUS LANCETS 28G MISC 1 each by Does not apply route daily. 30 each 12   warfarin (COUMADIN) 6 MG tablet TAKE 1/2 TABLET BY MOUTH ON TUES/THURS; AND 1 TABLET ALL OTHER DAYS OR AS DIRECTED BY HF CLINIC. 90 tablet 0   pantoprazole (PROTONIX) 40 MG tablet Take 1 tablet (40 mg total) by mouth daily. 90 tablet 3   No current facility-administered medications for this encounter.   Allergies  Allergen Reactions   Bee Venom     UNSPECIFIED REACTION    Social History   Socioeconomic History   Marital status: Single    Spouse name: Not on file   Number of children: Not on file   Years of education: Not on file   Highest education level: Not on file  Occupational History   Occupation: security  Tobacco Use   Smoking status: Former    Types: Cigars    Quit date: 11/16/2016    Years since quitting: 5.4   Smokeless tobacco: Never   Tobacco comments:    6 cigars per wk  Vaping Use   Vaping Use: Never used  Substance and Sexual Activity   Alcohol use: Yes    Alcohol/week: 4.0 standard drinks of alcohol    Types: 2 Shots of liquor, 2 Cans of beer per week    Comment: ocasional   Drug use: No   Sexual activity: Not Currently  Other Topics Concern   Not on file  Social History Narrative   Not on file   Social Determinants of Health   Financial Resource Strain: Low Risk   (03/25/2020)   Overall Financial Resource Strain (CARDIA)    Difficulty of Paying Living Expenses: Not very hard  Food Insecurity: No Food Insecurity (03/25/2020)   Hunger Vital Sign    Worried About Running Out of Food in the Last Year: Never true    Ran Out of Food in the Last Year: Never true  Transportation Needs: No Transportation Needs (03/25/2020)   PRAPARE - Administrator, Civil Service (Medical): No    Lack of Transportation (Non-Medical): No  Physical Activity: Not on file  Stress: Not on file  Social Connections: Not on file  Intimate Partner Violence: Not on file    Family History  Problem Relation Age of Onset   CAD Father    Vitals:   04/14/22 0922 04/14/22 0923  BP: (!) 118/0 107/66  Pulse: 83   SpO2: 98%   Weight: 117.8 kg (259 lb 9.6 oz)     Wt Readings from Last 3 Encounters:  04/14/22 117.8 kg (259 lb 9.6 oz)  01/06/22 119.4 kg (263 lb 3.2 oz)  11/02/21 119.2 kg (262 lb 12.8 oz)     Vital Signs:  Temp: Doppler Pressure: 118  Automatc BP: 107/66 (83)       HR: 83 SPO2: 98% on RA                                                         Weight: 259.6 lb w/ eqt  Last weight: 263.2 lb w/ ept  Physical Exam: General:  NAD.  HEENT: normal  Neck: supple. JVP not elevated.  Carotids 2+ bilat; no bruits. No lymphadenopathy or thryomegaly appreciated. Cor: LVAD hum.  Lungs: Clear. Abdomen: obese soft, nontender, non-distended. No hepatosplenomegaly. No bruits or masses. Good bowel sounds. Driveline site clean. Anchor in place.  Extremities: no cyanosis, clubbing, rash. Warm no edema  Neuro: alert & oriented x 3. No focal deficits. Moves all 4 without problem    ASSESSMENT & PLAN:  1. Chronic Combined Systolic/Diastolic Heart Failure. NICM  - ECHO 4/19 EF 20% CMRI EF 22%  - s/p HM-3 LVAD on 05/15/17 - Doing well. NYHA I - Volume status ok  2. VAD management.  - VAD interrogated  personally. Parameters stable. - Continues with frequent PI events. Encouraged fluid intake - Remains on ASA 81 mg daily + coumadin. - INR 2.1INR goal 2.0-2.5  Discussed dosing with PharmD personally. 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 Off amio - Continue coumadin  4. HTN -MAPs look good  5. DMII - Sugars remain elevated but improving. HgbA1c down to 7.6 on last check. - Doubt he will tolerate SGLT2i with volume issues.  - Continue glipizide 10 bid. Unable to tolerate metformin due to diarrhea - Referred to Endo but he did not go - Will recheck A1c  6. CKD 3a - Creatinine baseline 1.5-1.8 - Scr tune hemolyzed today/ Will recheck  7. Golf game - says he needs a new set of clubs for Christmas  Total time spent 35 minutes. Over half that time spent discussing above.     Arvilla Meresaniel Dyami Umbach, MD 04/23/22

## 2022-04-28 ENCOUNTER — Ambulatory Visit (HOSPITAL_COMMUNITY): Payer: Self-pay

## 2022-04-28 ENCOUNTER — Ambulatory Visit (HOSPITAL_COMMUNITY)
Admission: RE | Admit: 2022-04-28 | Discharge: 2022-04-28 | Disposition: A | Discharge status: Home / Self Care | Payer: Medicare Other | Source: Ambulatory Visit | Attending: Cardiology | Admitting: Cardiology

## 2022-04-28 DIAGNOSIS — I5022 Chronic systolic (congestive) heart failure: Secondary | ICD-10-CM

## 2022-04-28 DIAGNOSIS — Z95811 Presence of heart assist device: Secondary | ICD-10-CM | POA: Diagnosis not present

## 2022-04-28 DIAGNOSIS — Z7901 Long term (current) use of anticoagulants: Secondary | ICD-10-CM

## 2022-04-28 LAB — COMPREHENSIVE METABOLIC PANEL
ALT: 24 U/L (ref 0–44)
AST: 34 U/L (ref 15–41)
Albumin: 3.2 g/dL — ABNORMAL LOW (ref 3.5–5.0)
Alkaline Phosphatase: 37 U/L — ABNORMAL LOW (ref 38–126)
Anion gap: 9 (ref 5–15)
BUN: 20 mg/dL (ref 6–20)
CO2: 26 mmol/L (ref 22–32)
Calcium: 9 mg/dL (ref 8.9–10.3)
Chloride: 100 mmol/L (ref 98–111)
Creatinine, Ser: 1.37 mg/dL — ABNORMAL HIGH (ref 0.61–1.24)
GFR, Estimated: >60 mL/min (ref >60)
Glucose, Bld: 299 mg/dL — ABNORMAL HIGH (ref 70–99)
Potassium: 5.1 mmol/L (ref 3.5–5.1)
Sodium: 135 mmol/L (ref 135–145)
Total Bilirubin: 0.9 mg/dL (ref 0.3–1.2)
Total Protein: 7.1 g/dL (ref 6.5–8.1)

## 2022-04-28 LAB — PROTIME-INR
INR: 2 — ABNORMAL HIGH (ref 0.8–1.2)
Prothrombin Time: 22.1 seconds — ABNORMAL HIGH (ref 11.4–15.2)

## 2022-04-28 LAB — PREALBUMIN: Prealbumin: 29 mg/dL (ref 18–38)

## 2022-04-28 LAB — LACTATE DEHYDROGENASE: LDH: 290 U/L — ABNORMAL HIGH (ref 98–192)

## 2022-04-28 NOTE — Progress Notes (Addendum)
Patient presents to clinic today for labs and drive line exit wound care alone.  Reports no problems with VAD equipment or concerns with drive line.   Patient agreeable to scheduling changes needed for next 2 month follow up with Dr. Gala Romney. Patient moved to 5/31 at 1:00pm.  Exit Site Care: Existing VAD dressing removed and site care performed using sterile technique. Drive line exit site cleaned with Chlora prep applicators x 2, RINSED WITH SALINE, and allowed to dry. and Sorbaview dressing with Silverlon applied along with 2 large tegaderms. Exit site healed and fully incorporated, the velour is fully implanted at exit site. No drainage, tenderness, or foul odor. Drive line anchor re-applied. Pt denies fever or chills. Pt given 4 weekly kits at today's visit and asked to bring to clinic for each appointment.  Plan:  Return in 1 week for dressing change. Coumadin dosing per Wilford Sports RN VAD Coordinator  Office: [phone]  24/7 Pager: [phone]

## 2022-04-28 NOTE — Addendum Note (Signed)
Encounter addended by: Flora Lipps, RN on: 04/28/2022 11:57 AM  Actions taken: Clinical Note Signed

## 2022-05-01 ENCOUNTER — Other Ambulatory Visit (HOSPITAL_COMMUNITY): Payer: Self-pay | Admitting: Internal Medicine

## 2022-05-01 DIAGNOSIS — Z7901 Long term (current) use of anticoagulants: Secondary | ICD-10-CM

## 2022-05-01 DIAGNOSIS — Z95811 Presence of heart assist device: Secondary | ICD-10-CM

## 2022-05-05 ENCOUNTER — Other Ambulatory Visit (HOSPITAL_COMMUNITY): Payer: Medicare Other

## 2022-05-15 ENCOUNTER — Encounter (HOSPITAL_COMMUNITY): Payer: Self-pay

## 2022-05-15 NOTE — Progress Notes (Signed)
VAD Coordinator called patient in attempt to reschedule appointment for dressing change. Voicemail left with VAD Clinic call back number.  Simmie Davies RN, BSN VAD Coordinator 24/7 Pager [phone]

## 2022-05-17 ENCOUNTER — Other Ambulatory Visit (HOSPITAL_COMMUNITY): Payer: Self-pay | Admitting: *Deleted

## 2022-05-17 DIAGNOSIS — Z95811 Presence of heart assist device: Secondary | ICD-10-CM

## 2022-05-17 DIAGNOSIS — Z7901 Long term (current) use of anticoagulants: Secondary | ICD-10-CM

## 2022-05-18 ENCOUNTER — Ambulatory Visit (HOSPITAL_COMMUNITY)
Admission: RE | Admit: 2022-05-18 | Discharge: 2022-05-18 | Disposition: A | Discharge status: Home / Self Care | Payer: Medicare Other | Source: Ambulatory Visit | Attending: Cardiology | Admitting: Cardiology

## 2022-05-18 ENCOUNTER — Ambulatory Visit (HOSPITAL_COMMUNITY): Payer: Self-pay | Admitting: Pharmacist

## 2022-05-18 DIAGNOSIS — Z95811 Presence of heart assist device: Secondary | ICD-10-CM | POA: Insufficient documentation

## 2022-05-18 DIAGNOSIS — Z7901 Long term (current) use of anticoagulants: Secondary | ICD-10-CM

## 2022-05-18 DIAGNOSIS — Z48812 Encounter for surgical aftercare following surgery on the circulatory system: Secondary | ICD-10-CM | POA: Insufficient documentation

## 2022-05-18 LAB — PROTIME-INR
INR: 2.2 — ABNORMAL HIGH (ref 0.8–1.2)
Prothrombin Time: 24.4 seconds — ABNORMAL HIGH (ref 11.4–15.2)

## 2022-05-18 NOTE — Progress Notes (Signed)
Patient presents to clinic today for labs and drive line exit wound care alone.  Reports no problems with VAD equipment or concerns with drive line.    Exit Site Care: Existing VAD dressing removed and site care performed using sterile technique. Drive line exit site cleaned with Chlora prep applicators x 2, RINSED WITH SALINE, and allowed to dry. and Sorbaview dressing with Silverlon applied along with 2 large tegaderms. Exit site healed and fully incorporated, the velour is fully implanted at exit site. No drainage, tenderness, or foul odor. Drive line anchor re-applied. Pt denies fever or chills. Pt given 4 weekly kits at today's visit and asked to bring to clinic for each appointment.  Plan:  Return in 1 week for dressing change. Coumadin dosing per Haynes Kerns RN VAD Coordinator  Office: [phone]  24/7 Pager: [phone]

## 2022-05-19 ENCOUNTER — Other Ambulatory Visit (HOSPITAL_COMMUNITY): Payer: Self-pay | Admitting: Unknown Physician Specialty

## 2022-05-19 DIAGNOSIS — Z7901 Long term (current) use of anticoagulants: Secondary | ICD-10-CM

## 2022-05-19 DIAGNOSIS — Z95811 Presence of heart assist device: Secondary | ICD-10-CM

## 2022-05-26 ENCOUNTER — Ambulatory Visit (HOSPITAL_COMMUNITY): Payer: Self-pay | Admitting: Pharmacist

## 2022-05-26 ENCOUNTER — Ambulatory Visit (HOSPITAL_COMMUNITY)
Admission: RE | Admit: 2022-05-26 | Discharge: 2022-05-26 | Disposition: A | Discharge status: Home / Self Care | Payer: Medicare Other | Source: Ambulatory Visit | Attending: Internal Medicine | Admitting: Internal Medicine

## 2022-05-26 DIAGNOSIS — Z95811 Presence of heart assist device: Secondary | ICD-10-CM | POA: Diagnosis present

## 2022-05-26 DIAGNOSIS — Z7901 Long term (current) use of anticoagulants: Secondary | ICD-10-CM | POA: Insufficient documentation

## 2022-05-26 LAB — PROTIME-INR
INR: 2.2 — ABNORMAL HIGH (ref 0.8–1.2)
Prothrombin Time: 24.3 seconds — ABNORMAL HIGH (ref 11.4–15.2)

## 2022-05-26 NOTE — Progress Notes (Addendum)
Patient presents to clinic today for labs and drive line exit wound care alone.  Reports no problems with VAD equipment or concerns with drive line.    Exit Site Care: Existing VAD dressing removed and site care performed using sterile technique. Drive line exit site cleaned with Chlora prep applicators x 2, RINSED WITH SALINE, and allowed to dry. and Sorbaview dressing with Silverlon applied. Exit site healed and fully incorporated, the velour is fully implanted at exit site. No drainage, tenderness, or foul odor. Drive line anchor re-applied. Pt denies fever or chills. Pt has adequate dressing supplies at home  Plan:  Return in 1 week for dressing change. Coumadin dosing per Ericka Pontiff RN VAD Coordinator  Office: [phone]  24/7 Pager: [phone]

## 2022-05-26 NOTE — Addendum Note (Signed)
Encounter addended by: Bernita Raisin, RN on: 05/26/2022 10:48 AM  Actions taken: Clinical Note Signed

## 2022-05-26 NOTE — Addendum Note (Signed)
Encounter addended by: Bernita Raisin, RN on: 05/26/2022 11:46 AM  Actions taken: Clinical Note Signed

## 2022-06-01 ENCOUNTER — Encounter (HOSPITAL_COMMUNITY): Payer: Medicare Other

## 2022-06-01 ENCOUNTER — Other Ambulatory Visit (HOSPITAL_COMMUNITY): Payer: Medicare Other

## 2022-06-02 ENCOUNTER — Other Ambulatory Visit (HOSPITAL_COMMUNITY): Payer: Medicare Other

## 2022-06-02 ENCOUNTER — Encounter (HOSPITAL_COMMUNITY): Payer: Medicare Other

## 2022-06-14 ENCOUNTER — Other Ambulatory Visit (HOSPITAL_COMMUNITY): Payer: Medicare Other

## 2022-06-14 ENCOUNTER — Other Ambulatory Visit (HOSPITAL_COMMUNITY): Payer: Self-pay

## 2022-06-14 DIAGNOSIS — Z7901 Long term (current) use of anticoagulants: Secondary | ICD-10-CM

## 2022-06-14 DIAGNOSIS — Z95811 Presence of heart assist device: Secondary | ICD-10-CM

## 2022-06-15 ENCOUNTER — Encounter (HOSPITAL_COMMUNITY): Payer: Medicare Other | Admitting: Internal Medicine

## 2022-06-16 ENCOUNTER — Other Ambulatory Visit (HOSPITAL_COMMUNITY): Payer: Self-pay | Admitting: *Deleted

## 2022-06-16 ENCOUNTER — Encounter (HOSPITAL_COMMUNITY): Payer: Self-pay | Admitting: Internal Medicine

## 2022-06-16 ENCOUNTER — Ambulatory Visit (HOSPITAL_COMMUNITY)
Admission: RE | Admit: 2022-06-16 | Discharge: 2022-06-16 | Disposition: A | Discharge status: Home / Self Care | Payer: Medicare Other | Source: Ambulatory Visit | Attending: Internal Medicine | Admitting: Internal Medicine

## 2022-06-16 ENCOUNTER — Ambulatory Visit (HOSPITAL_COMMUNITY): Payer: Self-pay | Admitting: Pharmacist

## 2022-06-16 VITALS — BP 105/73 | HR 74 | Temp 98.7°F | Ht 72.0 in | Wt 256.6 lb

## 2022-06-16 DIAGNOSIS — I428 Other cardiomyopathies: Secondary | ICD-10-CM | POA: Diagnosis not present

## 2022-06-16 DIAGNOSIS — Z7984 Long term (current) use of oral hypoglycemic drugs: Secondary | ICD-10-CM | POA: Diagnosis not present

## 2022-06-16 DIAGNOSIS — I5022 Chronic systolic (congestive) heart failure: Secondary | ICD-10-CM | POA: Diagnosis not present

## 2022-06-16 DIAGNOSIS — Z7901 Long term (current) use of anticoagulants: Secondary | ICD-10-CM | POA: Diagnosis not present

## 2022-06-16 DIAGNOSIS — Z95811 Presence of heart assist device: Secondary | ICD-10-CM | POA: Insufficient documentation

## 2022-06-16 DIAGNOSIS — N1831 Chronic kidney disease, stage 3a: Secondary | ICD-10-CM | POA: Insufficient documentation

## 2022-06-16 DIAGNOSIS — I5042 Chronic combined systolic (congestive) and diastolic (congestive) heart failure: Secondary | ICD-10-CM | POA: Insufficient documentation

## 2022-06-16 DIAGNOSIS — E1122 Type 2 diabetes mellitus with diabetic chronic kidney disease: Secondary | ICD-10-CM | POA: Diagnosis not present

## 2022-06-16 DIAGNOSIS — E1169 Type 2 diabetes mellitus with other specified complication: Secondary | ICD-10-CM

## 2022-06-16 DIAGNOSIS — Z7982 Long term (current) use of aspirin: Secondary | ICD-10-CM | POA: Diagnosis not present

## 2022-06-16 DIAGNOSIS — Z79899 Other long term (current) drug therapy: Secondary | ICD-10-CM | POA: Insufficient documentation

## 2022-06-16 DIAGNOSIS — I130 Hypertensive heart and chronic kidney disease with heart failure and stage 1 through stage 4 chronic kidney disease, or unspecified chronic kidney disease: Secondary | ICD-10-CM | POA: Insufficient documentation

## 2022-06-16 DIAGNOSIS — Z86718 Personal history of other venous thrombosis and embolism: Secondary | ICD-10-CM | POA: Insufficient documentation

## 2022-06-16 DIAGNOSIS — I10 Essential (primary) hypertension: Secondary | ICD-10-CM

## 2022-06-16 DIAGNOSIS — E039 Hypothyroidism, unspecified: Secondary | ICD-10-CM | POA: Diagnosis not present

## 2022-06-16 LAB — BASIC METABOLIC PANEL
Anion gap: 8 (ref 5–15)
BUN: 23 mg/dL — ABNORMAL HIGH (ref 6–20)
CO2: 26 mmol/L (ref 22–32)
Calcium: 8.7 mg/dL — ABNORMAL LOW (ref 8.9–10.3)
Chloride: 100 mmol/L (ref 98–111)
Creatinine, Ser: 1.77 mg/dL — ABNORMAL HIGH (ref 0.61–1.24)
GFR, Estimated: 45 mL/min — ABNORMAL LOW (ref >60)
Glucose, Bld: 165 mg/dL — ABNORMAL HIGH (ref 70–99)
Potassium: 5 mmol/L (ref 3.5–5.1)
Sodium: 134 mmol/L — ABNORMAL LOW (ref 135–145)

## 2022-06-16 LAB — PROTIME-INR
INR: 1.7 — ABNORMAL HIGH (ref 0.8–1.2)
Prothrombin Time: 20 seconds — ABNORMAL HIGH (ref 11.4–15.2)

## 2022-06-16 LAB — CBC
HCT: 43.9 % (ref 39.0–52.0)
Hemoglobin: 14.3 g/dL (ref 13.0–17.0)
MCH: 27.8 pg (ref 26.0–34.0)
MCHC: 32.6 g/dL (ref 30.0–36.0)
MCV: 85.4 fL (ref 80.0–100.0)
Platelets: 232 10*3/uL (ref 150–400)
RBC: 5.14 MIL/uL (ref 4.22–5.81)
RDW: 15.8 % — ABNORMAL HIGH (ref 11.5–15.5)
WBC: 7.6 10*3/uL (ref 4.0–10.5)
nRBC: 0 % (ref 0.0–0.2)

## 2022-06-16 LAB — LACTATE DEHYDROGENASE: LDH: 376 U/L — ABNORMAL HIGH (ref 98–192)

## 2022-06-16 NOTE — Patient Instructions (Signed)
No change in medications. Return next week for dressing change. Return to see Dr. Gala Romney in 2 months in VAD Clinic - will update you next visit with that time and date. Please call the VAD office, pager, and/or 911 VAD alarms in the future.

## 2022-06-16 NOTE — Progress Notes (Signed)
Patient presents for 2 month follow up in VAD Clinic today alone. Reports no problems with VAD equipment or concerns with drive line.   Pt states that he is feeling good and has been playing golf. He denies lightheadedness, dizziness, falls, heart failure symptoms, and signs of bleeding.  He does report controller alarms that occurred Sunday evening on 06/11/22. He says "this thing went crazy" and he called 911. He denies any symptoms during the event and reports he felt "fine". Alarms occurred while on his batteries and he remembers seeing "Low Voltage" on the controller screen. When EMS arrived they switched him to his MPU and alarms stopped. He charged the batteries in question and placed them on next morning with no further alarms. Event log downloaded and sent to [employer] for review; they were able to review events from 5/29 - 5/31 with no issues noted. Reviewed battery alarms with patient. Pt verbalized understanding of same.   Reports he is taking medications as prescribed. Reports he is not checking his blood sugars at home; stressed importance of diabetes management.     Vital Signs:        Temp: 98.7 Doppler Pressure: 86 Automatc BP:  150/73 (89)  HR: 74 SPO2: 97% on RA        Weight: 256.6 lb w/ eqt  Last weight: 259.6 lb w/ ept   VAD Indication: Destination Therapy - due to limited social support.    LVAD assessment:      Speed: 5300 RPM      Flow: 4.3       Power: 3.9w       PI: 5.2 Alarms: none  Events:  30 - 50 PI events daily  Hct: 20       Fixed speed: 5300 Low speed limit: 5000  Primary Controller: Expired. Replaced with [id number] Manufacture: 11/08/21 Expiration: 10/10/23. Replace back up battery in 29 months.  Back up controller:  Replace back up battery in 22 months.  I reviewed the LVAD parameters from today and compared the results to the patient's prior recorded data. LVAD interrogation was STABLE for significant power changes, NEGATIVE for clinical  alarms and STABLE for PI events/speed drops. No programming changes were made and pump is functioning within specified parameters.    LVAD equipment check completed and is in good working order. Back-up equipment present.   Annual maintenance completed on patient's home equipment per [facility] 04/28/21.  Exit Site Care:  Existing VAD dressing removed and site care performed using sterile technique. Drive line exit site cleaned with Chlora prep applicators x 2, allowed to dry, and silverlon patch w/ Sorbaview dressing applied. Exit site healed and incorporated, the velour is fully implanted at exit site. No drainage, redness, tenderness, foul odor noted. Drive line anchor repositioned and re-applied. Pt denies fever or chills. Pt has adequate dressing supplies at home.   Device:  N/A   BP & Labs:  Doppler 86 - reflecting MAP   Hgb pending - No S/S of bleeding. Specifically denies melena/BRBPR or nosebleeds.   LDH pending - established baseline of 200 - 400. Denies tea-colored urine. No power elevations noted on interrogation other than above noted incidents.    Patient Instructions:  No change in medications. Return next week for dressing change. Return to see Dr. Gala Romney in 2 months in VAD Clinic - will update you next visit with that time and date. Please call the VAD office, pager, and/or 911 VAD alarms in the future.  [name] RN VAD Coordinator  Office: [phone]  24/7 Pager: [phone]

## 2022-06-18 NOTE — Progress Notes (Signed)
LVAD Clinic Note   Primary Cardiologist: Dr. Gala Romney   HPI: Benjamin Sherman is a 52 y.o. 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 ECHO showed severely reduced EF. 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 Placed on steroids without benefit.  Admitted 11/18 after fall/syncopal episode resulting in bifrontal SAH. Neurosurgery consulted.   Admitted 4/19 with cardiogenic shock in setting of AFL. Supported with inotropes and underwent DC-CV but had persistent shock. After numerous discussions about concern for adequate social support underwent placement of HM-3 LVAD on 05/15/17. D/c home 06/04/17  Here for routine f/u. Feels great. Active. Playing golf. No CP or SOB. Says his VAD was alarming a few days ago after switching to his batteries. He called EMS and they switched him to wall unit and it was fine. Has not happened again. No events on VAD now. No fevers, chills or problems with driveline. No bleeding, melena or neuro symptoms.Taking all meds as prescribed.    VAD Indication: Destination Therapy - due to limited social support.    LVAD assessment:                                                    Speed: 5300 RPM                                                       Flow: 4.3                                                                      Power: 3.9w                                PI: 5.2 Alarms: none  Events:  30 - 50 PI events daily  Hct: 20                                                              Fixed speed: 5300 Low speed limit: 5000   Primary Controller: Expired. Replaced with [id number] Manufacture: 11/08/21 Expiration: 10/10/23. Replace back up battery in 29 months.  Back up controller:  Replace back up battery in 22 months.   I reviewed the LVAD parameters from today and compared the results to  the patient's prior recorded data. LVAD interrogation was STABLE for significant power changes, NEGATIVE for clinical alarms and STABLE for PI events/speed drops. No programming changes were made and pump is functioning within specified parameters.    LVAD equipment check completed and is in good working order. Back-up equipment present.  Annual maintenance completed on patient's home equipment per [facility] 04/28/21.   Exit Site Care:  Existing VAD dressing removed and site care performed using sterile technique. Drive line exit site cleaned with Chlora prep applicators x 2, allowed to dry, and silverlon patch w/ Sorbaview dressing applied. Exit site healed and incorporated, the velour is fully implanted at exit site. No drainage, redness, tenderness, foul odor noted. Drive line anchor repositioned and re-applied. Pt denies fever or chills. Pt has adequate dressing supplies at home.       Past Medical History:  Diagnosis Date   CHF (congestive heart failure) (HCC)    Diabetes mellitus without complication (HCC)     Current Outpatient Medications  Medication Sig Dispense Refill   amLODipine (NORVASC) 5 MG tablet Take 2 tablets (10 mg total) by mouth daily. 180 tablet 3   aspirin 81 MG EC tablet Take 1 tablet (81 mg total) by mouth daily. 90 tablet 3   Blood Glucose Monitoring Suppl (TRUE METRIX METER) DEVI 1 each by Does not apply route daily with breakfast. 1 Device 0   glipiZIDE (GLUCOTROL) 10 MG tablet Take 1 tablet (10 mg total) by mouth 2 (two) times daily before a meal. 180 tablet 3   glucose blood (TRUE METRIX BLOOD GLUCOSE TEST) test strip Use daily before breakfast 30 each 12   pantoprazole (PROTONIX) 40 MG tablet Take 1 tablet (40 mg total) by mouth daily. 90 tablet 3   sacubitril-valsartan (ENTRESTO) 49-51 MG TAKE (1) TABLET BY MOUTH TWICE DAILY. 180 tablet 3   TRUEPLUS LANCETS 28G MISC 1 each by Does not apply route daily. 30 each 12   warfarin (COUMADIN) 6 MG tablet TAKE 1/2  TABLET BY MOUTH ON TUES/THURS; AND 1 TABLET ALL OTHER DAYS OR AS DIRECTED BY HF CLINIC. 77 tablet 0   No current facility-administered medications for this encounter.   Allergies  Allergen Reactions   Bee Venom     UNSPECIFIED REACTION    Social History   Socioeconomic History   Marital status: Single    Spouse name: Not on file   Number of children: Not on file   Years of education: Not on file   Highest education level: Not on file  Occupational History   Occupation: security  Tobacco Use   Smoking status: Former    Types: Cigars    Quit date: 11/16/2016    Years since quitting: 5.5   Smokeless tobacco: Never   Tobacco comments:    6 cigars per wk  Vaping Use   Vaping Use: Never used  Substance and Sexual Activity   Alcohol use: Yes    Alcohol/week: 4.0 standard drinks of alcohol    Types: 2 Shots of liquor, 2 Cans of beer per week    Comment: ocasional   Drug use: No   Sexual activity: Not Currently  Other Topics Concern   Not on file  Social History Narrative   Not on file   Social Determinants of Health   Financial Resource Strain: Low Risk  (03/25/2020)   Overall Financial Resource Strain (CARDIA)    Difficulty of Paying Living Expenses: Not very hard  Food Insecurity: No Food Insecurity (03/25/2020)   Hunger Vital Sign    Worried About Running Out of Food in the Last Year: Never true    Ran Out of Food in the Last Year: Never true  Transportation Needs: No Transportation Needs (03/25/2020)   PRAPARE - Administrator, Civil Service (Medical): No  Lack of Transportation (Non-Medical): No  Physical Activity: Not on file  Stress: Not on file  Social Connections: Not on file  Intimate Partner Violence: Not on file    Family History  Problem Relation Age of Onset   CAD Father    Vitals:   06/16/22 1422 06/16/22 1423  BP: (!) 86/0 105/73  Pulse:  74  Temp:  98.7 F (37.1 C)  SpO2:  97%  Weight:  116.4 kg (256 lb 9.6 oz)  Height:  6'  (1.829 m)    Wt Readings from Last 3 Encounters:  06/16/22 116.4 kg (256 lb 9.6 oz)  04/14/22 117.8 kg (259 lb 9.6 oz)  01/06/22 119.4 kg (263 lb 3.2 oz)      Vital Signs:                                                                 Temp: 98.7 Doppler Pressure: 86 Automatc BP:  150/73 (89)      HR: 74 SPO2: 97% on RA                                                         Weight: 256.6 lb w/ eqt  Last weight: 259.6 lb w/ ept  Physical Exam: General:  NAD.  HEENT: normal  Neck: supple. JVP not elevated.  Carotids 2+ bilat; no bruits. No lymphadenopathy or thryomegaly appreciated. Cor: LVAD hum.  Lungs: Clear. Abdomen: obese soft, nontender, non-distended. No hepatosplenomegaly. No bruits or masses. Good bowel sounds. Driveline site clean. Anchor in place.  Extremities: no cyanosis, clubbing, rash. Warm no edema  Neuro: alert & oriented x 3. No focal deficits. Moves all 4 without problem   ASSESSMENT & PLAN:  1. Chronic Combined Systolic/Diastolic Heart Failure. NICM  - ECHO 4/19 EF 20% CMRI EF 22%  - s/p HM-3 LVAD on 05/15/17 - Doing well. NYHA I - Volume status ok  2. VAD management.  - VAD interrogated personally. Parameters stable. - Continues with frequent PI events. Encouraged fluid intake - Remains on ASA 81 mg daily + coumadin. - Suspect VAD alarms due to dead batteries that were not properly charged. Log file downloaded and sent to [employer] - INR 1.7 INR goal 2.0-2.5  Discussed dosing with PharmD personally. - LDH mildly elevated at 376 but no other findings to suggest pump thrombosis. 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 Off amio - Continue coumadin  4. HTN -MAPs look good  5. DMII - Sugars remain elevated but improving. HgbA1c down to 7.6 on last check. - Doubt he will tolerate SGLT2i with volume issues.  - Continue glipizide 10 bid. Unable to tolerate metformin due to diarrhea - Referred to Endo but he did not  go  6. CKD 3a - Creatinine baseline 1.5-1.8 - Scr 1.77 today  7. Golf game - says he needs a new set of clubs for Christmas  Total time spent 40 minutes. Over half that time spent discussing above.     [name], MD 06/18/22

## 2022-06-22 ENCOUNTER — Other Ambulatory Visit (HOSPITAL_COMMUNITY): Payer: Medicare Other

## 2022-06-26 ENCOUNTER — Telehealth (HOSPITAL_COMMUNITY): Payer: Self-pay | Admitting: *Deleted

## 2022-06-26 NOTE — Telephone Encounter (Signed)
Called patient re: missed appointment last week for dressing change. Left message asking him to call us to schedule dressing change as well as INR this week.

## 2022-06-28 ENCOUNTER — Other Ambulatory Visit (HOSPITAL_COMMUNITY): Payer: Self-pay

## 2022-06-28 DIAGNOSIS — Z7901 Long term (current) use of anticoagulants: Secondary | ICD-10-CM

## 2022-06-28 DIAGNOSIS — Z95811 Presence of heart assist device: Secondary | ICD-10-CM

## 2022-06-29 ENCOUNTER — Encounter (HOSPITAL_COMMUNITY): Payer: Self-pay | Admitting: *Deleted

## 2022-06-30 ENCOUNTER — Ambulatory Visit (HOSPITAL_COMMUNITY): Payer: Self-pay | Admitting: Pharmacist

## 2022-06-30 ENCOUNTER — Ambulatory Visit (HOSPITAL_COMMUNITY)
Admission: RE | Admit: 2022-06-30 | Discharge: 2022-06-30 | Disposition: A | Discharge status: Home / Self Care | Payer: Medicare Other | Source: Ambulatory Visit | Attending: Cardiology | Admitting: Cardiology

## 2022-06-30 DIAGNOSIS — Z7901 Long term (current) use of anticoagulants: Secondary | ICD-10-CM | POA: Insufficient documentation

## 2022-06-30 DIAGNOSIS — Z95811 Presence of heart assist device: Secondary | ICD-10-CM | POA: Insufficient documentation

## 2022-06-30 LAB — PROTIME-INR
INR: 2.4 — ABNORMAL HIGH (ref 0.8–1.2)
Prothrombin Time: 26.5 seconds — ABNORMAL HIGH (ref 11.4–15.2)

## 2022-06-30 NOTE — Progress Notes (Signed)
Patient presents to clinic today for lNR and drive line exit wound care alone. Reports no problems with VAD equipment or concerns with drive line.    Exit Site Care: Existing VAD dressing removed and site care performed using sterile technique. Drive line exit site cleaned with Chlora prep applicators x 2, RINSED WITH SALINE, and allowed to dry. and Sorbaview dressing with Silverlon applied. Exit site healed and fully incorporated, the velour is fully implanted at exit site. No drainage, tenderness, or foul odor. Drive line anchor re-applied. Pt denies fever or chills. Pt has adequate dressing supplies at home  Plan:  Return in 1 week for dressing change. Coumadin dosing per Ericka Pontiff RN VAD Coordinator  Office: [phone]  24/7 Pager: [phone]

## 2022-07-07 ENCOUNTER — Encounter (HOSPITAL_COMMUNITY): Payer: Medicare Other

## 2022-07-07 ENCOUNTER — Encounter (HOSPITAL_COMMUNITY): Payer: Self-pay | Admitting: *Deleted

## 2022-07-14 ENCOUNTER — Encounter (HOSPITAL_COMMUNITY): Payer: Self-pay | Admitting: *Deleted

## 2022-07-17 ENCOUNTER — Other Ambulatory Visit (HOSPITAL_COMMUNITY): Payer: Self-pay | Admitting: Internal Medicine

## 2022-07-17 ENCOUNTER — Other Ambulatory Visit (HOSPITAL_COMMUNITY): Payer: Self-pay

## 2022-07-17 DIAGNOSIS — Z7901 Long term (current) use of anticoagulants: Secondary | ICD-10-CM

## 2022-07-17 DIAGNOSIS — I5022 Chronic systolic (congestive) heart failure: Secondary | ICD-10-CM

## 2022-07-17 DIAGNOSIS — Z95811 Presence of heart assist device: Secondary | ICD-10-CM

## 2022-07-17 DIAGNOSIS — N183 Chronic kidney disease, stage 3 unspecified: Secondary | ICD-10-CM

## 2022-07-17 MED ORDER — AMLODIPINE BESYLATE 5 MG PO TABS
10.0000 mg | ORAL_TABLET | Freq: Every day | ORAL | 3 refills | Status: DC
Start: 2022-07-17 — End: 2023-01-16

## 2022-07-17 MED ORDER — WARFARIN SODIUM 6 MG PO TABS
ORAL_TABLET | ORAL | 3 refills | Status: DC
Start: 2022-07-17 — End: 2022-07-19

## 2022-07-19 ENCOUNTER — Ambulatory Visit (HOSPITAL_COMMUNITY): Payer: Self-pay | Admitting: Pharmacist

## 2022-07-19 ENCOUNTER — Ambulatory Visit (HOSPITAL_COMMUNITY)
Admission: RE | Admit: 2022-07-19 | Discharge: 2022-07-19 | Disposition: A | Discharge status: Home / Self Care | Payer: Medicare Other | Source: Ambulatory Visit | Attending: Cardiology | Admitting: Cardiology

## 2022-07-19 DIAGNOSIS — Z7901 Long term (current) use of anticoagulants: Secondary | ICD-10-CM | POA: Diagnosis not present

## 2022-07-19 DIAGNOSIS — Z95811 Presence of heart assist device: Secondary | ICD-10-CM | POA: Diagnosis not present

## 2022-07-19 LAB — PROTIME-INR
INR: 1.9 — ABNORMAL HIGH (ref 0.8–1.2)
Prothrombin Time: 21.8 seconds — ABNORMAL HIGH (ref 11.4–15.2)

## 2022-07-19 MED ORDER — WARFARIN SODIUM 6 MG PO TABS
ORAL_TABLET | ORAL | 3 refills | Status: DC
Start: 2022-07-19 — End: 2022-11-07

## 2022-07-19 NOTE — Progress Notes (Signed)
Patient presents to clinic today for lNR and drive line exit wound care alone. Reports no problems with VAD equipment or concerns with drive line.    Exit Site Care: Existing VAD dressing removed and site care performed using sterile technique. Drive line exit site cleaned with Chlora prep applicators x 2, RINSED WITH SALINE, and allowed to dry. and Sorbaview dressing with Silverlon applied. Exit site healed and fully incorporated, the velour is fully implanted at exit site. No drainage, tenderness, or foul odor. Drive line anchor re-applied. Pt denies fever or chills. Pt has adequate dressing supplies at home  Plan:  Return in 1 week for dressing change. Coumadin dosing per Wilford Sports RN,BSN VAD Coordinator  Office: [phone]  24/7 Pager: [phone]

## 2022-07-28 ENCOUNTER — Other Ambulatory Visit (HOSPITAL_COMMUNITY): Payer: Medicare Other

## 2022-07-28 ENCOUNTER — Other Ambulatory Visit (HOSPITAL_COMMUNITY): Payer: Self-pay

## 2022-08-02 ENCOUNTER — Other Ambulatory Visit (HOSPITAL_COMMUNITY): Payer: Self-pay

## 2022-08-02 DIAGNOSIS — Z95811 Presence of heart assist device: Secondary | ICD-10-CM

## 2022-08-02 DIAGNOSIS — Z7901 Long term (current) use of anticoagulants: Secondary | ICD-10-CM

## 2022-08-04 ENCOUNTER — Ambulatory Visit (HOSPITAL_COMMUNITY)
Admission: RE | Admit: 2022-08-04 | Discharge: 2022-08-04 | Disposition: A | Discharge status: Home / Self Care | Payer: Medicare Other | Source: Ambulatory Visit | Attending: Cardiology | Admitting: Cardiology

## 2022-08-04 ENCOUNTER — Ambulatory Visit (HOSPITAL_COMMUNITY): Payer: Self-pay | Admitting: Pharmacist

## 2022-08-04 DIAGNOSIS — Z7901 Long term (current) use of anticoagulants: Secondary | ICD-10-CM | POA: Diagnosis present

## 2022-08-04 DIAGNOSIS — Z95811 Presence of heart assist device: Secondary | ICD-10-CM | POA: Insufficient documentation

## 2022-08-04 LAB — PROTIME-INR
INR: 2.2 — ABNORMAL HIGH (ref 0.8–1.2)
Prothrombin Time: 24.9 seconds — ABNORMAL HIGH (ref 11.4–15.2)

## 2022-08-04 NOTE — Progress Notes (Signed)
Patient presents to clinic today for lNR and drive line exit wound care alone. Reports no problems with VAD equipment or concerns with drive line.    Exit Site Care: Existing VAD dressing removed and site care performed using sterile technique. Drive line exit site cleaned with Chlora prep applicators x 2, RINSED WITH SALINE, and allowed to dry. and Sorbaview dressing with Silverlon applied. Exit site healed and fully incorporated, the velour is fully implanted at exit site. No drainage, tenderness, or foul odor. Drive line anchor re-applied. Pt denies fever or chills. Pt has adequate dressing supplies at home  Plan:  Please call VAD clinic when you are ready to schedule your next dressing change Coumadin dosing per Ericka Pontiff RN VAD Coordinator  Office: [phone]  24/7 Pager: [phone]

## 2022-08-11 ENCOUNTER — Other Ambulatory Visit (HOSPITAL_COMMUNITY): Payer: Self-pay | Admitting: *Deleted

## 2022-08-11 DIAGNOSIS — Z7901 Long term (current) use of anticoagulants: Secondary | ICD-10-CM

## 2022-08-11 DIAGNOSIS — Z95811 Presence of heart assist device: Secondary | ICD-10-CM

## 2022-08-18 ENCOUNTER — Other Ambulatory Visit: Payer: Self-pay

## 2022-08-30 ENCOUNTER — Other Ambulatory Visit (HOSPITAL_COMMUNITY): Payer: Self-pay

## 2022-08-30 DIAGNOSIS — Z95811 Presence of heart assist device: Secondary | ICD-10-CM

## 2022-08-30 DIAGNOSIS — Z7901 Long term (current) use of anticoagulants: Secondary | ICD-10-CM

## 2022-09-01 ENCOUNTER — Ambulatory Visit (HOSPITAL_COMMUNITY)
Admission: RE | Admit: 2022-09-01 | Discharge: 2022-09-01 | Disposition: A | Discharge status: Home / Self Care | Payer: Medicare Other | Source: Ambulatory Visit | Attending: Internal Medicine | Admitting: Internal Medicine

## 2022-09-01 ENCOUNTER — Ambulatory Visit (HOSPITAL_COMMUNITY): Payer: Self-pay | Admitting: Pharmacist

## 2022-09-01 DIAGNOSIS — Z4509 Encounter for adjustment and management of other cardiac device: Secondary | ICD-10-CM | POA: Diagnosis present

## 2022-09-01 DIAGNOSIS — Z7901 Long term (current) use of anticoagulants: Secondary | ICD-10-CM | POA: Insufficient documentation

## 2022-09-01 DIAGNOSIS — Z95811 Presence of heart assist device: Secondary | ICD-10-CM | POA: Insufficient documentation

## 2022-09-01 LAB — PROTIME-INR
INR: 2.5 — ABNORMAL HIGH (ref 0.8–1.2)
Prothrombin Time: 26.9 seconds — ABNORMAL HIGH (ref 11.4–15.2)

## 2022-09-01 NOTE — Progress Notes (Signed)
Patient presents to clinic today for lNR and drive line exit wound care alone.   Pt tells me that his controller is beeping when he switches over to his MPU at night. Pt also notes that at times during the day his controller randomly beeps when he has full batteries. Pins inspected on both leads of his controller. All pins intact. Pt was instructed to clean his clips and switch to his back up clips. Pt was also given a loaner MPU today [id number]. Pt informed that if he doesn't have anymore beeping after switching his MPU then he needs to bring his old MPU next week and we will order him a new one. If the beeping persists after changing his clips and MPU we will change his controller next week.  Exit Site Care: Existing VAD dressing removed and site care performed using sterile technique. Drive line exit site cleaned with Chlora prep applicators x 2, RINSED WITH SALINE, and allowed to dry. and Sorbaview dressing with Silverlon applied. Exit site healed and fully incorporated, the velour is fully implanted at exit site. No drainage, tenderness, or foul odor. Pt has some skin exocriation under the anchor. Pt was switched to the cath grip anchors today. Drive line anchor re-applied. Pt denies fever or chills. Pt given 4 extra cath grip anchors for home use.  Plan:  Return to clinic next week for a full visit with Dr Gala Romney Coumadin dosing per Haynes Kerns RN VAD Coordinator  Office: [phone]  24/7 Pager: [phone]

## 2022-09-07 ENCOUNTER — Other Ambulatory Visit: Payer: Self-pay

## 2022-09-07 ENCOUNTER — Other Ambulatory Visit (HOSPITAL_COMMUNITY): Payer: Self-pay

## 2022-09-07 DIAGNOSIS — Z95811 Presence of heart assist device: Secondary | ICD-10-CM

## 2022-09-07 DIAGNOSIS — Z7901 Long term (current) use of anticoagulants: Secondary | ICD-10-CM

## 2022-09-08 ENCOUNTER — Ambulatory Visit (HOSPITAL_COMMUNITY): Payer: Self-pay | Admitting: Pharmacist

## 2022-09-08 ENCOUNTER — Ambulatory Visit (HOSPITAL_COMMUNITY)
Admission: RE | Admit: 2022-09-08 | Discharge: 2022-09-08 | Disposition: A | Discharge status: Home / Self Care | Payer: Medicare Other | Source: Ambulatory Visit | Attending: Internal Medicine | Admitting: Internal Medicine

## 2022-09-08 VITALS — BP 104/0 | HR 74 | Ht 72.0 in | Wt 260.0 lb

## 2022-09-08 DIAGNOSIS — I130 Hypertensive heart and chronic kidney disease with heart failure and stage 1 through stage 4 chronic kidney disease, or unspecified chronic kidney disease: Secondary | ICD-10-CM | POA: Diagnosis not present

## 2022-09-08 DIAGNOSIS — Z95811 Presence of heart assist device: Secondary | ICD-10-CM | POA: Diagnosis not present

## 2022-09-08 DIAGNOSIS — Z7901 Long term (current) use of anticoagulants: Secondary | ICD-10-CM | POA: Diagnosis not present

## 2022-09-08 DIAGNOSIS — I5022 Chronic systolic (congestive) heart failure: Secondary | ICD-10-CM

## 2022-09-08 DIAGNOSIS — E1122 Type 2 diabetes mellitus with diabetic chronic kidney disease: Secondary | ICD-10-CM | POA: Insufficient documentation

## 2022-09-08 DIAGNOSIS — E1169 Type 2 diabetes mellitus with other specified complication: Secondary | ICD-10-CM

## 2022-09-08 DIAGNOSIS — I428 Other cardiomyopathies: Secondary | ICD-10-CM | POA: Diagnosis present

## 2022-09-08 DIAGNOSIS — I4892 Unspecified atrial flutter: Secondary | ICD-10-CM | POA: Insufficient documentation

## 2022-09-08 DIAGNOSIS — I5042 Chronic combined systolic (congestive) and diastolic (congestive) heart failure: Secondary | ICD-10-CM | POA: Diagnosis not present

## 2022-09-08 DIAGNOSIS — I10 Essential (primary) hypertension: Secondary | ICD-10-CM

## 2022-09-08 DIAGNOSIS — N1831 Chronic kidney disease, stage 3a: Secondary | ICD-10-CM | POA: Diagnosis not present

## 2022-09-08 DIAGNOSIS — I480 Paroxysmal atrial fibrillation: Secondary | ICD-10-CM

## 2022-09-08 DIAGNOSIS — N1832 Chronic kidney disease, stage 3b: Secondary | ICD-10-CM

## 2022-09-08 LAB — CBC
HCT: 44.3 % (ref 39.0–52.0)
Hemoglobin: 14.7 g/dL (ref 13.0–17.0)
MCH: 28.2 pg (ref 26.0–34.0)
MCHC: 33.2 g/dL (ref 30.0–36.0)
MCV: 85 fL (ref 80.0–100.0)
Platelets: 235 10*3/uL (ref 150–400)
RBC: 5.21 MIL/uL (ref 4.22–5.81)
RDW: 15 % (ref 11.5–15.5)
WBC: 7.5 10*3/uL (ref 4.0–10.5)
nRBC: 0 % (ref 0.0–0.2)

## 2022-09-08 LAB — PROTIME-INR
INR: 2.2 — ABNORMAL HIGH (ref 0.8–1.2)
Prothrombin Time: 24.2 seconds — ABNORMAL HIGH (ref 11.4–15.2)

## 2022-09-08 NOTE — Progress Notes (Signed)
Patient presents for 2 month follow up in VAD Clinic today alone. Reports no problems with VAD equipment or concerns with drive line.   Pt states that he is feeling good and has been playing golf. He denies lightheadedness, dizziness, falls, heart failure symptoms, and signs of bleeding.  Beeping from his controller while on the MPU resolved with new MPU. He states that he is still having low voltage alarms while connected to batteries even when he has 3 bars on his batteries. He has not changed out his battery clips. Pt was given new battery clips in clinic. If the new clips do not help, pt will need a controller change out.  Reports he is taking medications as prescribed. Reports he is not checking his blood sugars at home; stressed importance of diabetes management.    His BMET and LDH hemolyzed today. We will try to recheck these at his next INR check.   Vital Signs:        Temp: 98.7 Doppler Pressure: 104 Automatc BP:  107/86 (94)  HR: 74 SPO2: 97% on RA        Weight: 260 lb w/ eqt  Last weight: 256.6 lb w/ ept   VAD Indication: Destination Therapy - due to limited social support.    LVAD assessment:      Speed: 5300 RPM      Flow: 4.2       Power: 4w       PI: 6.2 Alarms: none  Events:  10 - 20 PI events daily  Hct: 20       Fixed speed: 5300 Low speed limit: 5000  Primary Controller:  Replace back up battery in 26 months.  Back up controller:  Replace back up battery in 19 months.  I reviewed the LVAD parameters from today and compared the results to the patient's prior recorded data. LVAD interrogation was STABLE for significant power changes, NEGATIVE for clinical alarms and STABLE for PI events/speed drops. No programming changes were made and pump is functioning within specified parameters.    LVAD equipment check completed and is in good working order. Back-up equipment present.   Annual maintenance completed on patient's home equipment per [facility]  04/28/21.  Exit Site Care:  Existing VAD dressing removed and site care performed using sterile technique. Drive line exit site cleaned with Chlora prep applicators x 2, allowed to dry, and silverlon patch w/ Sorbaview dressing applied. Exit site healed and incorporated, the velour is fully implanted at exit site. No drainage, redness, tenderness, foul odor noted. Drive line anchor repositioned and re-applied. Pt denies fever or chills. Pt has adequate dressing supplies at home.   Device:  N/A   BP & Labs:  Doppler 104 - reflecting Modified systolic   Hgb 14.7 - No S/S of bleeding. Specifically denies melena/BRBPR or nosebleeds.   LDH hemolyzed - established baseline of 200 - 400. Denies tea-colored urine. No power elevations noted on interrogation other than above noted incidents.    Patient Instructions:  No change in medications. Return next week for dressing change. Return to see Dr. Gala Romney in 2 months in VAD Clinic   [name] RN VAD Coordinator  Office: [phone]  24/7 Pager: [phone]

## 2022-09-08 NOTE — Patient Instructions (Signed)
No change in medications Return to clinic next Friday for dressing change Return to clinic in 2 mo to see DR Bensimhon

## 2022-09-10 NOTE — Progress Notes (Signed)
LVAD Clinic Note   Primary Cardiologist: Dr. Gala Romney   HPI: Benjamin Sherman is a 52 y.o. 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 ECHO showed severely reduced EF. 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 Placed on steroids without benefit.  Admitted 11/18 after fall/syncopal episode resulting in bifrontal SAH. Neurosurgery consulted.   Admitted 4/19 with cardiogenic shock in setting of AFL. Supported with inotropes and underwent DC-CV but had persistent shock. After numerous discussions about concern for adequate social support underwent placement of HM-3 LVAD on 05/15/17. D/c home 06/04/17  Here for routine f/u. Doing very well. Active playing golf. No SOB, edema, orthopnea or PND. Getting some beeping when he changes to batteries. Denies orthopnea or PND. No fevers, chills or problems with driveline. No bleeding, melena or neuro symptoms. No VAD alarms. Taking all meds as prescribed.     VAD Indication: Destination Therapy - due to limited social support.    LVAD assessment:                                                    Speed: 5300 RPM                                                       Flow: 4.2                                                                      Power: 4w                                   PI: 6.2 Alarms: none  Events:  10 - 20 PI events daily  Hct: 20                                                              Fixed speed: 5300 Low speed limit: 5000   Primary Controller:  Replace back up battery in 26 months.  Back up controller:  Replace back up battery in 19 months.   I reviewed the LVAD parameters from today and compared the results to the patient's prior recorded data. LVAD interrogation was STABLE for significant power changes, NEGATIVE for clinical alarms and STABLE for PI  events/speed drops. No programming changes were made and pump is functioning within specified parameters.    LVAD equipment check completed and is in good working order. Back-up equipment present.    Annual maintenance completed on patient's home equipment per [facility] 04/28/21.   Exit Site Care:  Existing VAD  dressing removed and site care performed using sterile technique. Drive line exit site cleaned with Chlora prep applicators x 2, allowed to dry, and silverlon patch w/ Sorbaview dressing applied. Exit site healed and incorporated, the velour is fully implanted at exit site. No drainage, redness, tenderness, foul odor noted. Drive line anchor repositioned and re-applied. Pt denies fever or chills. Pt has adequate dressing supplies at home.       Past Medical History:  Diagnosis Date   CHF (congestive heart failure) (HCC)    Diabetes mellitus without complication (HCC)     Current Outpatient Medications  Medication Sig Dispense Refill   amLODipine (NORVASC) 5 MG tablet Take 2 tablets (10 mg total) by mouth daily. 180 tablet 3   aspirin 81 MG EC tablet Take 1 tablet (81 mg total) by mouth daily. 90 tablet 3   Blood Glucose Monitoring Suppl (TRUE METRIX METER) DEVI 1 each by Does not apply route daily with breakfast. 1 Device 0   glipiZIDE (GLUCOTROL) 10 MG tablet Take 1 tablet (10 mg total) by mouth 2 (two) times daily before a meal. 180 tablet 3   glucose blood (TRUE METRIX BLOOD GLUCOSE TEST) test strip Use daily before breakfast 30 each 12   pantoprazole (PROTONIX) 40 MG tablet Take 1 tablet (40 mg total) by mouth daily. 90 tablet 3   sacubitril-valsartan (ENTRESTO) 49-51 MG TAKE (1) TABLET BY MOUTH TWICE DAILY. 180 tablet 3   TRUEPLUS LANCETS 28G MISC 1 each by Does not apply route daily. 30 each 12   warfarin (COUMADIN) 6 MG tablet 3 mg (6 mg x 0.5) every Tue, Thu; 6 mg (6 mg x 1) all other days 90 tablet 3   No current facility-administered medications for this encounter.    Allergies  Allergen Reactions   Bee Venom     UNSPECIFIED REACTION    Social History   Socioeconomic History   Marital status: Single    Spouse name: Not on file   Number of children: Not on file   Years of education: Not on file   Highest education level: Not on file  Occupational History   Occupation: security  Tobacco Use   Smoking status: Former    Types: Cigars    Quit date: 11/16/2016    Years since quitting: 5.8   Smokeless tobacco: Never   Tobacco comments:    6 cigars per wk  Vaping Use   Vaping status: Never Used  Substance and Sexual Activity   Alcohol use: Yes    Alcohol/week: 4.0 standard drinks of alcohol    Types: 2 Shots of liquor, 2 Cans of beer per week    Comment: ocasional   Drug use: No   Sexual activity: Not Currently  Other Topics Concern   Not on file  Social History Narrative   Not on file   Social Determinants of Health   Financial Resource Strain: Low Risk  (03/25/2020)   Overall Financial Resource Strain (CARDIA)    Difficulty of Paying Living Expenses: Not very hard  Food Insecurity: No Food Insecurity (03/25/2020)   Hunger Vital Sign    Worried About Running Out of Food in the Last Year: Never true    Ran Out of Food in the Last Year: Never true  Transportation Needs: No Transportation Needs (03/25/2020)   PRAPARE - Administrator, Civil Service (Medical): No    Lack of Transportation (Non-Medical): No  Physical Activity: Not on file  Stress: Not on file  Social Connections: Not on file  Intimate Partner Violence: Not on file    Family History  Problem Relation Age of Onset   CAD Father    Vitals:   09/08/22 1223 09/08/22 1224  BP: 107/86 (!) 104/0  Pulse: 74   SpO2: 97%   Weight: 117.9 kg (260 lb)   Height: 6' (1.829 m)     Wt Readings from Last 3 Encounters:  09/08/22 117.9 kg (260 lb)  06/16/22 116.4 kg (256 lb 9.6 oz)  04/14/22 117.8 kg (259 lb 9.6 oz)      Vital Signs:                                                                  Temp: 98.7 Doppler Pressure: 104 Automatc BP:  107/86 (94)      HR: 74 SPO2: 97% on RA                                                         Weight: 260 lb w/ eqt  Last weight: 256.6 lb w/ ept  Physical Exam: General:  NAD.  HEENT: normal  Neck: supple. JVP not elevated.  Carotids 2+ bilat; no bruits. No lymphadenopathy or thryomegaly appreciated. Cor: LVAD hum.  Lungs: Clear. Abdomen: obese soft, nontender, non-distended. No hepatosplenomegaly. No bruits or masses. Good bowel sounds. Driveline site clean. Anchor in place.  Extremities: no cyanosis, clubbing, rash. Warm no edema  Neuro: alert & oriented x 3. No focal deficits. Moves all 4 without problem    ASSESSMENT & PLAN:  1. Chronic Combined Systolic/Diastolic Heart Failure. NICM  - ECHO 4/19 EF 20% CMRI EF 22%  - s/p HM-3 LVAD on 05/15/17 - Doing well. NYHA I.  - Volume ok  2. VAD management.  - VAD interrogated personally. Parameters stable. - Keep up fluid intake.  - VAD coordinators gave new battery clips. If beeping doesn't resolve will need controller change - Remains on ASA 81 mg daily + coumadin. - INR 1.1 INR goal 2.0-2.5  Discussed dosing with PharmD personally. - LDH not drawn today. Will need to redraw.  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 Off amio - Continue coumadin  4. HTN -MAPs slightly elevated. - Continue to follow  5. DMII - Sugars remain elevated but improving. HgbA1c down to 7.6 on last check. - Doubt he will tolerate SGLT2i with volume issues.  - Continue glipizide 10 bid. Unable to tolerate metformin due to diarrhea - Referred to Endo but he did not go  6. CKD 3a - Creatinine baseline 1.5-1.8 - Scr not resulted today. Will redraw  7. Golf game - says he needs a new set of clubs for Christmas  Total time spent 40 minutes. Over half that time spent discussing above.    [name], MD 09/10/22

## 2022-09-11 ENCOUNTER — Other Ambulatory Visit (HOSPITAL_COMMUNITY): Payer: Self-pay | Admitting: *Deleted

## 2022-09-11 DIAGNOSIS — Z7901 Long term (current) use of anticoagulants: Secondary | ICD-10-CM

## 2022-09-11 DIAGNOSIS — Z95811 Presence of heart assist device: Secondary | ICD-10-CM

## 2022-09-15 ENCOUNTER — Other Ambulatory Visit (HOSPITAL_COMMUNITY): Payer: Medicare Other

## 2022-10-05 ENCOUNTER — Telehealth (HOSPITAL_COMMUNITY): Payer: Self-pay | Admitting: Unknown Physician Specialty

## 2022-10-05 NOTE — Telephone Encounter (Signed)
Pt called VAD office stating that his pharmacy told him he isnt due for a refill on his Entresto. I spoke with Apolinar Junes at [facility] who tells me that the pt picked up his entresto script earlier today.  [name] RN, BSN VAD Coordinator 24/7 Pager [phone]

## 2022-10-16 ENCOUNTER — Encounter (HOSPITAL_COMMUNITY): Payer: Self-pay | Admitting: *Deleted

## 2022-10-17 ENCOUNTER — Other Ambulatory Visit (HOSPITAL_COMMUNITY): Payer: Self-pay | Admitting: *Deleted

## 2022-10-17 DIAGNOSIS — Z95811 Presence of heart assist device: Secondary | ICD-10-CM

## 2022-10-17 DIAGNOSIS — Z7901 Long term (current) use of anticoagulants: Secondary | ICD-10-CM

## 2022-10-18 ENCOUNTER — Ambulatory Visit (HOSPITAL_COMMUNITY): Payer: Self-pay | Admitting: Pharmacist

## 2022-10-18 ENCOUNTER — Ambulatory Visit (HOSPITAL_COMMUNITY)
Admission: RE | Admit: 2022-10-18 | Discharge: 2022-10-18 | Disposition: A | Discharge status: Home / Self Care | Payer: Medicare Other | Source: Ambulatory Visit | Attending: Cardiology | Admitting: Cardiology

## 2022-10-18 DIAGNOSIS — Z95811 Presence of heart assist device: Secondary | ICD-10-CM | POA: Insufficient documentation

## 2022-10-18 DIAGNOSIS — Z7901 Long term (current) use of anticoagulants: Secondary | ICD-10-CM | POA: Diagnosis not present

## 2022-10-18 LAB — PROTIME-INR
INR: 2.3 — ABNORMAL HIGH (ref 0.8–1.2)
Prothrombin Time: 25.3 s — ABNORMAL HIGH (ref 11.4–15.2)

## 2022-10-18 NOTE — Progress Notes (Signed)
Patient presents to clinic today for lNR and drive line exit wound care alone.   Pt returned loaner [phone] to clinic. Provided with new MPU (SN: [id number]) today.   Pt has not had dressing change since he was last seen in VAD clinic on 09/08/22. Discussed importance of weekly dressing changes to prevent drive line infection. He verbalized understanding.   Exit Site Care: Existing VAD dressing removed and site care performed using sterile technique. Drive line exit site cleaned with Chlora prep applicators x 2, RINSED WITH SALINE, and allowed to dry. and Sorbaview dressing with Silverlon applied. Exit site healed and fully incorporated, the velour is fully implanted at exit site. No drainage, tenderness, or foul odor. Pt has some skin exocriation under the anchor. Drive line anchor repositioned and re-applied. States cath grip anchors do not work for him. Pt denies fever or chills. Pt provided with 4 weekly kits for home use.   Plan:  Return in 1 week for dressing change and INR.  Coumadin dosing per Ericka Pontiff RN VAD Coordinator  Office: [phone]  24/7 Pager: [phone]

## 2022-10-20 ENCOUNTER — Other Ambulatory Visit (HOSPITAL_COMMUNITY): Payer: Self-pay | Admitting: Unknown Physician Specialty

## 2022-10-20 DIAGNOSIS — Z7901 Long term (current) use of anticoagulants: Secondary | ICD-10-CM

## 2022-10-20 DIAGNOSIS — Z95811 Presence of heart assist device: Secondary | ICD-10-CM

## 2022-10-26 ENCOUNTER — Ambulatory Visit (HOSPITAL_COMMUNITY)
Admission: RE | Admit: 2022-10-26 | Discharge: 2022-10-26 | Disposition: A | Discharge status: Home / Self Care | Payer: Medicare Other | Source: Ambulatory Visit | Attending: Cardiology | Admitting: Cardiology

## 2022-10-26 ENCOUNTER — Ambulatory Visit (HOSPITAL_COMMUNITY): Payer: Self-pay | Admitting: Pharmacist

## 2022-10-26 DIAGNOSIS — Z95811 Presence of heart assist device: Secondary | ICD-10-CM | POA: Diagnosis not present

## 2022-10-26 DIAGNOSIS — Z4509 Encounter for adjustment and management of other cardiac device: Secondary | ICD-10-CM | POA: Diagnosis present

## 2022-10-26 DIAGNOSIS — Z7901 Long term (current) use of anticoagulants: Secondary | ICD-10-CM | POA: Insufficient documentation

## 2022-10-26 LAB — PROTIME-INR
INR: 2.9 — ABNORMAL HIGH (ref 0.8–1.2)
Prothrombin Time: 30.8 s — ABNORMAL HIGH (ref 11.4–15.2)

## 2022-10-26 NOTE — Progress Notes (Signed)
Patient presents to clinic today for lNR and drive line exit wound care alone.   Exit Site Care: Existing VAD dressing removed and site care performed using sterile technique. Drive line exit site cleaned with Chlora prep applicators x 2, RINSED WITH SALINE, and allowed to dry. and Sorbaview dressing with Silverlon applied. Exit site healed and fully incorporated, the velour is fully implanted at exit site. No drainage, tenderness, or foul odor. Pt has some skin exocriation under the anchor. Drive line anchor repositioned and re-applied. States cath grip anchors do not work for him. Pt denies fever or chills. Pt provided with 4 weekly kits for home use.   Plan:  Return in 1 week for dressing change and INR.  Coumadin dosing per Wilford Sports RN,BSN VAD Coordinator  Office: [phone]  24/7 Pager: [phone]

## 2022-10-30 ENCOUNTER — Other Ambulatory Visit (HOSPITAL_COMMUNITY): Payer: Self-pay | Admitting: Unknown Physician Specialty

## 2022-10-30 DIAGNOSIS — Z7901 Long term (current) use of anticoagulants: Secondary | ICD-10-CM

## 2022-10-30 DIAGNOSIS — Z95811 Presence of heart assist device: Secondary | ICD-10-CM

## 2022-11-02 ENCOUNTER — Other Ambulatory Visit (HOSPITAL_COMMUNITY): Payer: Medicare Other

## 2022-11-07 ENCOUNTER — Telehealth: Payer: Self-pay

## 2022-11-07 ENCOUNTER — Other Ambulatory Visit (HOSPITAL_COMMUNITY): Payer: Self-pay

## 2022-11-07 DIAGNOSIS — Z95811 Presence of heart assist device: Secondary | ICD-10-CM

## 2022-11-07 DIAGNOSIS — Z7901 Long term (current) use of anticoagulants: Secondary | ICD-10-CM

## 2022-11-07 MED ORDER — WARFARIN SODIUM 6 MG PO TABS: ORAL_TABLET | ORAL | 3 refills | Status: DC

## 2022-11-07 NOTE — Telephone Encounter (Signed)
Pt called to inform VAD Clinic of Coumadin manufacture change. CMA spoke with pharmacy earlier this morning regarding change. [facility] made aware.   Simmie Davies RN, BSN VAD Coordinator 24/7 Pager [phone]

## 2022-11-15 ENCOUNTER — Other Ambulatory Visit (HOSPITAL_COMMUNITY): Payer: Self-pay | Admitting: *Deleted

## 2022-11-15 DIAGNOSIS — I5022 Chronic systolic (congestive) heart failure: Secondary | ICD-10-CM

## 2022-11-15 DIAGNOSIS — Z95811 Presence of heart assist device: Secondary | ICD-10-CM

## 2022-11-15 DIAGNOSIS — Z7901 Long term (current) use of anticoagulants: Secondary | ICD-10-CM

## 2022-11-17 ENCOUNTER — Other Ambulatory Visit (HOSPITAL_COMMUNITY): Payer: Self-pay | Admitting: *Deleted

## 2022-11-17 ENCOUNTER — Ambulatory Visit (HOSPITAL_COMMUNITY)
Admission: RE | Admit: 2022-11-17 | Discharge: 2022-11-17 | Disposition: A | Discharge status: Home / Self Care | Payer: Medicare Other | Source: Ambulatory Visit | Attending: Internal Medicine | Admitting: Internal Medicine

## 2022-11-17 ENCOUNTER — Ambulatory Visit (HOSPITAL_COMMUNITY): Payer: Self-pay | Admitting: Student-PharmD

## 2022-11-17 ENCOUNTER — Encounter (HOSPITAL_COMMUNITY): Payer: Medicare Other

## 2022-11-17 ENCOUNTER — Encounter (HOSPITAL_COMMUNITY): Payer: Self-pay | Admitting: Internal Medicine

## 2022-11-17 VITALS — BP 107/90 | HR 82 | Wt 259.4 lb

## 2022-11-17 DIAGNOSIS — I428 Other cardiomyopathies: Secondary | ICD-10-CM | POA: Diagnosis not present

## 2022-11-17 DIAGNOSIS — E1169 Type 2 diabetes mellitus with other specified complication: Secondary | ICD-10-CM

## 2022-11-17 DIAGNOSIS — Z86718 Personal history of other venous thrombosis and embolism: Secondary | ICD-10-CM | POA: Insufficient documentation

## 2022-11-17 DIAGNOSIS — I130 Hypertensive heart and chronic kidney disease with heart failure and stage 1 through stage 4 chronic kidney disease, or unspecified chronic kidney disease: Secondary | ICD-10-CM | POA: Insufficient documentation

## 2022-11-17 DIAGNOSIS — E039 Hypothyroidism, unspecified: Secondary | ICD-10-CM | POA: Insufficient documentation

## 2022-11-17 DIAGNOSIS — N1831 Chronic kidney disease, stage 3a: Secondary | ICD-10-CM

## 2022-11-17 DIAGNOSIS — Z79899 Other long term (current) drug therapy: Secondary | ICD-10-CM | POA: Diagnosis not present

## 2022-11-17 DIAGNOSIS — I5042 Chronic combined systolic (congestive) and diastolic (congestive) heart failure: Secondary | ICD-10-CM | POA: Insufficient documentation

## 2022-11-17 DIAGNOSIS — Z7984 Long term (current) use of oral hypoglycemic drugs: Secondary | ICD-10-CM | POA: Insufficient documentation

## 2022-11-17 DIAGNOSIS — Z95811 Presence of heart assist device: Secondary | ICD-10-CM

## 2022-11-17 DIAGNOSIS — Z7982 Long term (current) use of aspirin: Secondary | ICD-10-CM | POA: Diagnosis not present

## 2022-11-17 DIAGNOSIS — I5022 Chronic systolic (congestive) heart failure: Secondary | ICD-10-CM | POA: Diagnosis not present

## 2022-11-17 DIAGNOSIS — Z7901 Long term (current) use of anticoagulants: Secondary | ICD-10-CM

## 2022-11-17 DIAGNOSIS — I480 Paroxysmal atrial fibrillation: Secondary | ICD-10-CM

## 2022-11-17 DIAGNOSIS — E1122 Type 2 diabetes mellitus with diabetic chronic kidney disease: Secondary | ICD-10-CM | POA: Insufficient documentation

## 2022-11-17 LAB — CBC
HCT: 44 % (ref 39.0–52.0)
Hemoglobin: 14.6 g/dL (ref 13.0–17.0)
MCH: 28.9 pg (ref 26.0–34.0)
MCHC: 33.2 g/dL (ref 30.0–36.0)
MCV: 87 fL (ref 80.0–100.0)
Platelets: 244 10*3/uL (ref 150–400)
RBC: 5.06 MIL/uL (ref 4.22–5.81)
RDW: 15.7 % — ABNORMAL HIGH (ref 11.5–15.5)
WBC: 8.2 10*3/uL (ref 4.0–10.5)
nRBC: 0 % (ref 0.0–0.2)

## 2022-11-17 LAB — PROTIME-INR
INR: 1.8 — ABNORMAL HIGH (ref 0.8–1.2)
Prothrombin Time: 20.7 s — ABNORMAL HIGH (ref 11.4–15.2)

## 2022-11-17 NOTE — Progress Notes (Signed)
Patient presents for 2 month follow up in VAD Clinic today alone. Reports no problems with VAD equipment or concerns with drive line.   Pt states that he is feeling good and has been playing golf. He denies lightheadedness, dizziness, falls, heart failure symptoms, and signs of bleeding.  Reports he is taking medications as prescribed. Reports he is not checking his blood sugars at home; stressed importance of diabetes management and endocrinology follow up. He verbalized understanding.   BMET/LDH hemolyzed. Will attempt to recollect with next INR.   Vital Signs:        Temp: 98.7 Doppler Pressure: 102 Automatc BP: 107/90 (97)  HR: 82 SPO2: 98% on RA        Weight: 259.4 lb w/ eqt  Last weight: 260 lb w/ ept   VAD Indication: Destination Therapy - due to limited social support.    LVAD assessment:      Speed: 5300 RPM      Flow: 4.3       Power: 4w       PI: 4.7  Alarms: few LV Events:  20 - 40 PI events daily  Hct: 20       Fixed speed: 5300 Low speed limit: 5000  Primary Controller:  Replace back up battery in 23 months.  Back up controller:  Expired. Replaced w/ [id number] Manufacture- 06/06/22 Exp- 05/06/24. Replace back up battery in 31 months.  I reviewed the LVAD parameters from today and compared the results to the patient's prior recorded data. LVAD interrogation was STABLE for significant power changes, NEGATIVE for clinical alarms and STABLE for PI events/speed drops. No programming changes were made and pump is functioning within specified parameters.    LVAD equipment check completed and is in good working order. Back-up equipment present.   Annual maintenance completed on patient's home equipment per [facility] 04/28/21.  Exit Site Care:  Existing VAD dressing removed and site care performed using sterile technique. Drive line exit site cleaned with Chlora prep applicators x 2, allowed to dry, and silverlon patch w/ Sorbaview dressing applied. Exit site healed and  incorporated, the velour is fully implanted at exit site. No drainage, redness, tenderness, foul odor noted. Drive line anchor repositioned and re-applied. Pt denies fever or chills. Pt provided with 4 weekly kits and 6 anchors for home use.   Device:  N/A   BP & Labs:  Doppler 102 - reflecting Modified systolic   Hgb 14.6 - No S/S of bleeding. Specifically denies melena/BRBPR or nosebleeds.   LDH hemolyzed- established baseline of 200 - 400. Denies tea-colored urine. No power elevations noted on interrogation other than above noted incidents.   5.5 year Intermacs follow up completed including:  Quality of Life, KCCQ-12, and Neurocognitive trail making.   Pt completed 1200 feet during 6 minute walk.  Back up controller: 11V backup battery charged during this visit.  Patient Goals: To get back to walking multiple times a week at the mall/around his neighborhood  [place] Cardiomyopathy Questionnaire     11/17/2022    2:25 PM 04/14/2022    1:07 PM 11/02/2021    9:35 AM  KCCQ-12  1 a. Ability to shower/bathe Not at all limited Not at all limited Not at all limited  1 b. Ability to walk 1 block Not at all limited Not at all limited Not at all limited  1 c. Ability to hurry/jog Not at all limited Not at all limited Not at all limited  2. Edema feet/ankles/legs  Less than once a week Never over the past 2 weeks Never over the past 2 weeks  3. Limited by fatigue Less than once a week Never over the past 2 weeks Never over the past 2 weeks  4. Limited by dyspnea Less than once a week Less than once a week Never over the past 2 weeks  5. Sitting up / on 3+ pillows Never over the past 2 weeks Never over the past 2 weeks Never over the past 2 weeks  6. Limited enjoyment of life Slightly limited Moderately limited Moderately limited  7. Rest of life w/ symptoms Mostly dissatisfied Mostly dissatisfied Mostly dissatisfied  8 a. Participation in hobbies Slightly limited Did not limit at all  Slightly limited  8 b. Participation in chores Did not limit at all Did not limit at all Did not limit at all  8 c. Visiting family/friends Did not limit at all Did not limit at all Did not limit at all     Patient Instructions:  No change in medications. Coumadin dosing per Leotis Shames PharmD Return next week for dressing change. Return to see Dr. Gala Romney in 2 months in VAD Clinic   Alyce Pagan RN VAD Coordinator  Office: [phone]  24/7 Pager: [phone]

## 2022-11-17 NOTE — Patient Instructions (Signed)
No change in medications. Coumadin dosing per Leotis Shames PharmD Return next week for dressing change. Return to see Dr. Gala Romney in 2 months in VAD Clinic

## 2022-11-19 NOTE — Progress Notes (Signed)
LVAD Clinic Note   Primary Cardiologist: Dr. Gala Romney   HPI: Benjamin Sherman is a 52 y.o. 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 ECHO showed severely reduced EF. 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 Placed on steroids without benefit.  Admitted 11/18 after fall/syncopal episode resulting in bifrontal SAH. Neurosurgery consulted.   Admitted 4/19 with cardiogenic shock in setting of AFL. Supported with inotropes and underwent DC-CV but had persistent shock. After numerous discussions about concern for adequate social support underwent placement of HM-3 LVAD on 05/15/17. D/c home 06/04/17  Here for routine f/u. Doing very well.Remains active playing golf and other activities. No SOB, orthopnea, PND or edema. Not following sugars closely. No fevers, chills or problems with driveline. No bleeding, melena or neuro symptoms. No VAD alarms. Taking all meds as prescribed.   VAD Indication: Destination Therapy - due to limited social support.    LVAD assessment:                                                    Speed: 5300 RPM                                                       Flow: 4.3                                                                      Power: 4w                                   PI: 4.7   Alarms: few LV Events:  20 - 40 PI events daily  Hct: 20                                                              Fixed speed: 5300 Low speed limit: 5000   Primary Controller:  Replace back up battery in 23 months.  Back up controller:  Expired. Replaced w/ [id number] Manufacture- 06/06/22 Exp- 05/06/24. Replace back up battery in 31 months.   I reviewed the LVAD parameters from today and compared the results to the patient's prior recorded data. LVAD interrogation was STABLE for significant power changes,  NEGATIVE for clinical alarms and STABLE for PI events/speed drops. No programming changes were made and pump is functioning within specified parameters.    LVAD equipment check completed and is in good working order. Back-up equipment present.    Annual maintenance completed on patient's home equipment per [facility] 04/28/21.   Exit Site Care:  Existing VAD dressing removed and site care performed using sterile technique. Drive line exit site cleaned with Chlora prep applicators x 2, allowed to dry, and silverlon patch w/ Sorbaview dressing applied. Exit site healed and incorporated, the velour is fully implanted at exit site. No drainage, redness, tenderness, foul odor noted. Drive line anchor repositioned and re-applied. Pt denies fever or chills. Pt has adequate dressing supplies at home.       Past Medical History:  Diagnosis Date   CHF (congestive heart failure) (HCC)    Diabetes mellitus without complication (HCC)     Current Outpatient Medications  Medication Sig Dispense Refill   amLODipine (NORVASC) 5 MG tablet Take 2 tablets (10 mg total) by mouth daily. 180 tablet 3   aspirin 81 MG EC tablet Take 1 tablet (81 mg total) by mouth daily. 90 tablet 3   glipiZIDE (GLUCOTROL) 10 MG tablet Take 1 tablet (10 mg total) by mouth 2 (two) times daily before a meal. 180 tablet 3   pantoprazole (PROTONIX) 40 MG tablet Take 1 tablet (40 mg total) by mouth daily. 90 tablet 3   sacubitril-valsartan (ENTRESTO) 49-51 MG TAKE (1) TABLET BY MOUTH TWICE DAILY. 180 tablet 3   Blood Glucose Monitoring Suppl (TRUE METRIX METER) DEVI 1 each by Does not apply route daily with breakfast. (Patient not taking: Reported on 11/17/2022) 1 Device 0   glucose blood (TRUE METRIX BLOOD GLUCOSE TEST) test strip Use daily before breakfast (Patient not taking: Reported on 11/17/2022) 30 each 12   TRUEPLUS LANCETS 28G MISC 1 each by Does not apply route daily. (Patient not taking: Reported on 11/17/2022) 30 each 12   warfarin  (COUMADIN) 6 MG tablet 3 mg (6 mg x 0.5) every Tue, Thu; 6 mg (6 mg x 1) all other days 90 tablet 3   No current facility-administered medications for this encounter.   Allergies  Allergen Reactions   Bee Venom     UNSPECIFIED REACTION    Social History   Socioeconomic History   Marital status: Single    Spouse name: Not on file   Number of children: Not on file   Years of education: Not on file   Highest education level: Not on file  Occupational History   Occupation: security  Tobacco Use   Smoking status: Former    Types: Cigars    Quit date: 11/16/2016    Years since quitting: 6.0   Smokeless tobacco: Never   Tobacco comments:    6 cigars per wk  Vaping Use   Vaping status: Never Used  Substance and Sexual Activity   Alcohol use: Yes    Alcohol/week: 4.0 standard drinks of alcohol    Types: 2 Shots of liquor, 2 Cans of beer per week    Comment: ocasional   Drug use: No   Sexual activity: Not Currently  Other Topics Concern   Not on file  Social History Narrative   Not on file   Social Determinants of Health   Financial Resource Strain: Low Risk  (03/25/2020)   Overall Financial Resource Strain (CARDIA)    Difficulty of Paying Living Expenses: Not very hard  Food Insecurity: No Food Insecurity (03/25/2020)   Hunger Vital Sign    Worried About Running Out of Food in the Last Year: Never true    Ran Out of Food in the Last Year: Never true  Transportation Needs: No Transportation Needs (03/25/2020)   PRAPARE - Administrator, Civil Service (Medical):  No    Lack of Transportation (Non-Medical): No  Physical Activity: Not on file  Stress: Not on file  Social Connections: Not on file  Intimate Partner Violence: Not on file    Family History  Problem Relation Age of Onset   CAD Father    Vitals:   11/17/22 1305 11/17/22 1319  BP: (!) 102/0 (!) 107/90  Pulse: 82   SpO2: 98%   Weight: 117.7 kg (259 lb 6.4 oz)     Wt Readings from Last 3  Encounters:  11/17/22 117.7 kg (259 lb 6.4 oz)  09/08/22 117.9 kg (260 lb)  06/16/22 116.4 kg (256 lb 9.6 oz)       Vital Signs:                                                                 Temp: 98.7 Doppler Pressure: 102 Automatc BP: 107/90 (97)       HR: 82 SPO2: 98% on RA                                                         Weight: 259.4 lb w/ eqt  Last weight: 260 lb w/ ept  Physical Exam: General:  NAD.  HEENT: normal  Neck: supple. JVP not elevated.  Carotids 2+ bilat; no bruits. No lymphadenopathy or thryomegaly appreciated. Cor: LVAD hum.  Lungs: Clear. Abdomen: obese soft, nontender, non-distended. No hepatosplenomegaly. No bruits or masses. Good bowel sounds. Driveline site clean. Anchor in place.  Extremities: no cyanosis, clubbing, rash. Warm no edema  Neuro: alert & oriented x 3. No focal deficits. Moves all 4 without problem     ASSESSMENT & PLAN:  1. Chronic Combined Systolic/Diastolic Heart Failure. NICM  - ECHO 4/19 EF 20% CMRI EF 22%  - s/p HM-3 LVAD on 05/15/17 - Doing well. NYHA I  - Volume ok  2. VAD management.  - VAD interrogated personally. Parameters stable.. - Keep up fluid intake.  - Remains on ASA 81 mg daily + coumadin. - INR 1.8 INR goal 2.0-2.5  Discussed warfarin dosing with PharmD personally. - LDH and BMET hemolyzed. 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 Off amio - Continue coumadin - No bleeding  4. HTN - MAPs ok   5. DMII - Sugars remain elevated. Fortunately last HgbA1c down to 7.6  - Doubt he will tolerate SGLT2i with volume issues.  - Continue glipizide 10 bid. Unable to tolerate metformin due to diarrhea - Referred to Endo but he did not go. Encouraged him to f/u  6. CKD 3a - Creatinine baseline 1.5-1.8 - Scr hemolyzed today. Will redraw  7. Golf game - says he needs a new set of clubs for Christmas. I'm going to call [facility]  Total time spent 40 minutes. Over half  that time spent discussing above.    [name], MD 11/19/22

## 2022-11-23 ENCOUNTER — Other Ambulatory Visit (HOSPITAL_COMMUNITY): Payer: Medicare Other

## 2022-12-19 ENCOUNTER — Ambulatory Visit (HOSPITAL_COMMUNITY)
Admission: RE | Admit: 2022-12-19 | Discharge: 2022-12-19 | Disposition: A | Discharge status: Home / Self Care | Payer: Medicare Other | Source: Ambulatory Visit | Attending: Cardiology | Admitting: Cardiology

## 2022-12-19 ENCOUNTER — Ambulatory Visit (HOSPITAL_COMMUNITY): Payer: Self-pay | Admitting: Pharmacist

## 2022-12-19 DIAGNOSIS — Z4801 Encounter for change or removal of surgical wound dressing: Secondary | ICD-10-CM | POA: Diagnosis present

## 2022-12-19 DIAGNOSIS — Z95811 Presence of heart assist device: Secondary | ICD-10-CM

## 2022-12-19 DIAGNOSIS — Z7901 Long term (current) use of anticoagulants: Secondary | ICD-10-CM | POA: Diagnosis not present

## 2022-12-19 LAB — PROTIME-INR
INR: 2.1 — ABNORMAL HIGH (ref 0.8–1.2)
Prothrombin Time: 23.4 s — ABNORMAL HIGH (ref 11.4–15.2)

## 2022-12-19 NOTE — Progress Notes (Signed)
Patient presents to clinic today for lNR and drive line exit wound care alone.   Exit Site Care: Existing VAD dressing removed and site care performed using sterile technique. Drive line exit site cleaned with Chlora prep applicators x 2, RINSED WITH SALINE, and allowed to dry. and Sorbaview dressing with Silverlon applied. Exit site healed and fully incorporated, the velour is fully implanted at exit site. No drainage, tenderness, or foul odor. Pt has some skin exocriation under the anchor. Drive line anchor repositioned and re-applied. States cath grip anchors do not work for him. Pt denies fever or chills. Pt provided with 4 weekly kits for home use.   Plan:  Return in 1 week for dressing change and INR.  Coumadin dosing per Wilford Sports RN,BSN VAD Coordinator  Office: [phone]  24/7 Pager: [phone]

## 2022-12-29 ENCOUNTER — Other Ambulatory Visit (HOSPITAL_COMMUNITY): Payer: Medicare Other

## 2023-01-05 ENCOUNTER — Other Ambulatory Visit (HOSPITAL_COMMUNITY): Payer: Self-pay | Admitting: Unknown Physician Specialty

## 2023-01-05 DIAGNOSIS — Z7901 Long term (current) use of anticoagulants: Secondary | ICD-10-CM

## 2023-01-05 DIAGNOSIS — Z95811 Presence of heart assist device: Secondary | ICD-10-CM

## 2023-01-08 ENCOUNTER — Ambulatory Visit (HOSPITAL_COMMUNITY)
Admission: RE | Admit: 2023-01-08 | Discharge: 2023-01-08 | Disposition: A | Discharge status: Home / Self Care | Payer: Medicare Other | Source: Ambulatory Visit | Attending: Internal Medicine | Admitting: Internal Medicine

## 2023-01-08 ENCOUNTER — Ambulatory Visit (HOSPITAL_COMMUNITY): Payer: Self-pay | Admitting: Pharmacist

## 2023-01-08 VITALS — BP 110/91 | HR 77 | Wt 259.0 lb

## 2023-01-08 DIAGNOSIS — Z7984 Long term (current) use of oral hypoglycemic drugs: Secondary | ICD-10-CM | POA: Diagnosis not present

## 2023-01-08 DIAGNOSIS — I5022 Chronic systolic (congestive) heart failure: Secondary | ICD-10-CM

## 2023-01-08 DIAGNOSIS — Z7901 Long term (current) use of anticoagulants: Secondary | ICD-10-CM

## 2023-01-08 DIAGNOSIS — Z79899 Other long term (current) drug therapy: Secondary | ICD-10-CM | POA: Diagnosis not present

## 2023-01-08 DIAGNOSIS — E118 Type 2 diabetes mellitus with unspecified complications: Secondary | ICD-10-CM

## 2023-01-08 DIAGNOSIS — Z95811 Presence of heart assist device: Secondary | ICD-10-CM | POA: Diagnosis not present

## 2023-01-08 DIAGNOSIS — N1831 Chronic kidney disease, stage 3a: Secondary | ICD-10-CM

## 2023-01-08 DIAGNOSIS — E039 Hypothyroidism, unspecified: Secondary | ICD-10-CM | POA: Diagnosis not present

## 2023-01-08 DIAGNOSIS — Z4509 Encounter for adjustment and management of other cardiac device: Secondary | ICD-10-CM | POA: Diagnosis present

## 2023-01-08 DIAGNOSIS — I428 Other cardiomyopathies: Secondary | ICD-10-CM | POA: Insufficient documentation

## 2023-01-08 DIAGNOSIS — Z7982 Long term (current) use of aspirin: Secondary | ICD-10-CM | POA: Diagnosis not present

## 2023-01-08 DIAGNOSIS — E1122 Type 2 diabetes mellitus with diabetic chronic kidney disease: Secondary | ICD-10-CM | POA: Diagnosis not present

## 2023-01-08 DIAGNOSIS — I130 Hypertensive heart and chronic kidney disease with heart failure and stage 1 through stage 4 chronic kidney disease, or unspecified chronic kidney disease: Secondary | ICD-10-CM | POA: Diagnosis not present

## 2023-01-08 DIAGNOSIS — I5042 Chronic combined systolic (congestive) and diastolic (congestive) heart failure: Secondary | ICD-10-CM | POA: Insufficient documentation

## 2023-01-08 DIAGNOSIS — I10 Essential (primary) hypertension: Secondary | ICD-10-CM

## 2023-01-08 DIAGNOSIS — I480 Paroxysmal atrial fibrillation: Secondary | ICD-10-CM

## 2023-01-08 DIAGNOSIS — I4892 Unspecified atrial flutter: Secondary | ICD-10-CM | POA: Insufficient documentation

## 2023-01-08 DIAGNOSIS — Z87891 Personal history of nicotine dependence: Secondary | ICD-10-CM | POA: Insufficient documentation

## 2023-01-08 LAB — BASIC METABOLIC PANEL
Anion gap: 8 (ref 5–15)
BUN: 24 mg/dL — ABNORMAL HIGH (ref 6–20)
CO2: 25 mmol/L (ref 22–32)
Calcium: 9.6 mg/dL (ref 8.9–10.3)
Chloride: 100 mmol/L (ref 98–111)
Creatinine, Ser: 1.3 mg/dL — ABNORMAL HIGH (ref 0.61–1.24)
GFR, Estimated: >60 mL/min (ref >60)
Glucose, Bld: 360 mg/dL — ABNORMAL HIGH (ref 70–99)
Potassium: 5.3 mmol/L — ABNORMAL HIGH (ref 3.5–5.1)
Sodium: 133 mmol/L — ABNORMAL LOW (ref 135–145)

## 2023-01-08 LAB — PROTIME-INR
INR: 1.6 — ABNORMAL HIGH (ref 0.8–1.2)
Prothrombin Time: 19.6 s — ABNORMAL HIGH (ref 11.4–15.2)

## 2023-01-08 LAB — LACTATE DEHYDROGENASE: LDH: 306 U/L — ABNORMAL HIGH (ref 98–192)

## 2023-01-08 LAB — CBC
HCT: 44 % (ref 39.0–52.0)
Hemoglobin: 14.4 g/dL (ref 13.0–17.0)
MCH: 28.1 pg (ref 26.0–34.0)
MCHC: 32.7 g/dL (ref 30.0–36.0)
MCV: 85.8 fL (ref 80.0–100.0)
Platelets: 223 10*3/uL (ref 150–400)
RBC: 5.13 MIL/uL (ref 4.22–5.81)
RDW: 15.2 % (ref 11.5–15.5)
WBC: 6.9 10*3/uL (ref 4.0–10.5)
nRBC: 0 % (ref 0.0–0.2)

## 2023-01-08 MED ORDER — ENTRESTO 49-51 MG PO TABS: ORAL_TABLET | ORAL | 3 refills | Status: DC

## 2023-01-08 NOTE — Progress Notes (Addendum)
Patient presents for 2 month follow up in VAD Clinic today alone. Reports no problems with VAD equipment or concerns with drive line.   Pt states that he is feeling good and has been playing golf, but is taking a break for the winter months. He denies lightheadedness, dizziness, falls, heart failure symptoms, and signs of bleeding.  Reports he is taking medications as prescribed. Reports he is not checking his blood sugars at home; stressed importance of diabetes management and endocrinology follow up. He verbalized understanding. Will check A1C with next visit  BP mildly elevated today. No medication changes today per Dr Gala Romney. Refill sent for pt's Entresto.   Vital Signs:        Temp:  Doppler Pressure: 120 Automatc BP: 110/91 (98)  HR: 77 SPO2: 95% on RA        Weight: 259.0 lb w/ eqt  Last weight: 259.4 lb w/ ept   VAD Indication: Destination Therapy - due to limited social support.    LVAD assessment:      Speed: 5300 RPM      Flow: 4.1      Power: 3.9 w       PI: 7.2  Alarms: few LV Events:  30 - 50 PI events daily  Hct: 20       Fixed speed: 5300 Low speed limit: 5000  Primary Controller:  Replace back up battery in 22 months.  Back up controller: Replace back up battery in 31 months.  I reviewed the LVAD parameters from today and compared the results to the patient's prior recorded data. LVAD interrogation was STABLE for significant power changes, NEGATIVE for clinical alarms and STABLE for PI events/speed drops. No programming changes were made and pump is functioning within specified parameters.    LVAD equipment check completed and is in good working order. Back-up equipment present.   Annual maintenance completed on patient's home equipment per [facility] 04/28/21.  Exit Site Care:  Existing VAD dressing removed and site care performed using sterile technique. Drive line exit site cleaned with Chlora prep applicators x 2, allowed to dry, and silverlon patch w/  Sorbaview dressing applied. Exit site healed and incorporated, the velour is fully implanted at exit site. No drainage, redness, tenderness, foul odor noted. Drive line anchor repositioned and re-applied. Pt denies fever or chills. Pt has adequate dressing supplies for home use.   Device:  N/A   BP & Labs:  Doppler 120 - reflecting Modified systolic   Hgb 14.4 - No S/S of bleeding. Specifically denies melena/BRBPR or nosebleeds.   LDH 306 ? hemolyzed - established baseline of 200 - 400. Denies tea-colored urine. No power elevations noted on interrogation other than above noted incidents.    Patient Instructions:  No medication changes today Coumadin dosing per Lauren PharmD Return to clinic in 2 months for follow up with Dr Gala Romney. We will complete your 6 year Intermacs at this appt. Please wear tennis shoes for your 6 minute walk. Please bring your battery charger, MPU, and black bag for annual maintenance.  Return in 1 week for INR & dressing change  Alyce Pagan RN VAD Coordinator  Office: [phone]  24/7 Pager: [phone]

## 2023-01-08 NOTE — Patient Instructions (Addendum)
No medication changes today Coumadin dosing per Lauren PharmD Return to clinic in 2 months for follow up with Dr Gala Romney. We will complete your 6 year Intermacs at this appt. Please wear tennis shoes for your 6 minute walk. Please bring your battery charger, MPU, and black bag for annual maintenance.  Return in 1 week for INR & dressing change

## 2023-01-10 NOTE — Progress Notes (Signed)
LVAD Clinic Note   Primary Cardiologist: Dr. Gala Romney   HPI: Benjamin Sherman is a 52 y.o. 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 ECHO showed severely reduced EF. 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 Placed on steroids without benefit.  Admitted 11/18 after fall/syncopal episode resulting in bifrontal SAH. Neurosurgery consulted.   Admitted 4/19 with cardiogenic shock in setting of AFL. Supported with inotropes and underwent DC-CV but had persistent shock. After numerous discussions about concern for adequate social support underwent placement of HM-3 LVAD on 05/15/17. D/c home 06/04/17  Here for routine f/u. Doing well. Active playing golf. Denies orthopnea or PND. No fevers, chills or problems with driveline. No bleeding, melena or neuro symptoms. No VAD alarms. Taking all meds as prescribed. Not checking blood sugars    VAD Indication: Destination Therapy - due to limited social support.    LVAD assessment:                                                    Speed: 5300 RPM                                                       Flow: 4.1                                                          Power: 3.9 w                               PI: 7.2   Alarms: few LV Events:  30 - 50 PI events daily  Hct: 20                                                              Fixed speed: 5300 Low speed limit: 5000   Primary Controller:  Replace back up battery in 22 months.  Back up controller: Replace back up battery in 31 months.   I reviewed the LVAD parameters from today and compared the results to the patient's prior recorded data. LVAD interrogation was STABLE for significant power changes, NEGATIVE for clinical alarms and STABLE for PI events/speed drops. No programming changes were made and pump is functioning  within specified parameters.    LVAD equipment check completed and is in good working order. Back-up equipment present.    Annual maintenance completed on patient's home equipment per [facility] 04/28/21.    Past Medical History:  Diagnosis Date   CHF (congestive heart failure) (HCC)    Diabetes mellitus without complication (HCC)     Current Outpatient Medications  Medication Sig  Dispense Refill   amLODipine (NORVASC) 5 MG tablet Take 2 tablets (10 mg total) by mouth daily. 180 tablet 3   aspirin 81 MG EC tablet Take 1 tablet (81 mg total) by mouth daily. 90 tablet 3   glipiZIDE (GLUCOTROL) 10 MG tablet Take 1 tablet (10 mg total) by mouth 2 (two) times daily before a meal. 180 tablet 3   pantoprazole (PROTONIX) 40 MG tablet Take 1 tablet (40 mg total) by mouth daily. 90 tablet 3   warfarin (COUMADIN) 6 MG tablet 3 mg (6 mg x 0.5) every Tue, Thu; 6 mg (6 mg x 1) all other days 90 tablet 3   Blood Glucose Monitoring Suppl (TRUE METRIX METER) DEVI 1 each by Does not apply route daily with breakfast. (Patient not taking: Reported on 01/08/2023) 1 Device 0   glucose blood (TRUE METRIX BLOOD GLUCOSE TEST) test strip Use daily before breakfast (Patient not taking: Reported on 01/08/2023) 30 each 12   sacubitril-valsartan (ENTRESTO) 49-51 MG TAKE (1) TABLET BY MOUTH TWICE DAILY. 180 tablet 3   TRUEPLUS LANCETS 28G MISC 1 each by Does not apply route daily. (Patient not taking: Reported on 01/08/2023) 30 each 12   No current facility-administered medications for this encounter.   Allergies  Allergen Reactions   Bee Venom     UNSPECIFIED REACTION    Social History   Socioeconomic History   Marital status: Single    Spouse name: Not on file   Number of children: Not on file   Years of education: Not on file   Highest education level: Not on file  Occupational History   Occupation: security  Tobacco Use   Smoking status: Former    Types: Cigars    Quit date: 11/16/2016    Years since  quitting: 6.1   Smokeless tobacco: Never   Tobacco comments:    6 cigars per wk  Vaping Use   Vaping status: Never Used  Substance and Sexual Activity   Alcohol use: Yes    Alcohol/week: 4.0 standard drinks of alcohol    Types: 2 Shots of liquor, 2 Cans of beer per week    Comment: ocasional   Drug use: No   Sexual activity: Not Currently  Other Topics Concern   Not on file  Social History Narrative   Not on file   Social Drivers of Health   Financial Resource Strain: Low Risk  (03/25/2020)   Overall Financial Resource Strain (CARDIA)    Difficulty of Paying Living Expenses: Not very hard  Food Insecurity: No Food Insecurity (03/25/2020)   Hunger Vital Sign    Worried About Running Out of Food in the Last Year: Never true    Ran Out of Food in the Last Year: Never true  Transportation Needs: No Transportation Needs (03/25/2020)   PRAPARE - Administrator, Civil Service (Medical): No    Lack of Transportation (Non-Medical): No  Physical Activity: Not on file  Stress: Not on file  Social Connections: Not on file  Intimate Partner Violence: Not on file    Family History  Problem Relation Age of Onset   CAD Father    Vitals:   01/08/23 0938 01/08/23 0945  BP: (!) 120/0 (!) 110/91  Pulse: 77   SpO2: 95%   Weight: 117.5 kg (259 lb)     Wt Readings from Last 3 Encounters:  01/08/23 117.5 kg (259 lb)  11/17/22 117.7 kg (259 lb 6.4 oz)  09/08/22 117.9 kg (260  lb)      Vital Signs:                                                                 Temp:  Doppler Pressure: 120 Automatc BP: 110/91 (98)       HR: 77 SPO2: 95% on RA                                                         Weight: 259.0 lb w/ eqt  Last weight: 259.4 lb w/ ept  Physical Exam: General:  NAD.  HEENT: normal  Neck: supple. JVP not elevated.  Carotids 2+ bilat; no bruits. No lymphadenopathy or thryomegaly appreciated. Cor: LVAD hum.  Lungs: Clear. Abdomen: obese soft, nontender,  non-distended. No hepatosplenomegaly. No bruits or masses. Good bowel sounds. Driveline site clean. Anchor in place.  Extremities: no cyanosis, clubbing, rash. Warm no edema  Neuro: alert & oriented x 3. No focal deficits. Moves all 4 without problem    ASSESSMENT & PLAN:  1. Chronic Combined Systolic/Diastolic Heart Failure. NICM  - ECHO 4/19 EF 20% CMRI EF 22%  - s/p HM-3 LVAD on 05/15/17 - Doing well NYHA I - Volumestatus ok  2. VAD management.  - VAD interrogated personally. Parameters stable.  - Keep up fluid intake.  - Remains on ASA 81 mg daily + coumadin. - INR 1.6 INR goal 2.0-2.5  Discussed warfarin dosing with PharmD personally. 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 Off amio - Continue coumadin - No bleeding  4. HTN - MAPs still a bit elevated but did not tolerate higher doses of Entresto  5. DMII - Sugars remain elevated. CBG 360 today. Fortunately last HgbA1c down to 7.6  - Doubt he will tolerate SGLT2i with volume issues.  - Continue glipizide 10 bid. Unable to tolerate metformin due to diarrhea - Referred to Endo but he did not go. Encouraged him to f/u and to check blood sugars routinely - Will check A1c at next visit   6. CKD 3a - Creatinine baseline 1.5-1.8 - Scr 1.3 today  7. Golf game - says he needs a new set of clubs for Christmas. I called Santa  I spent a total of 40 minutes today: 1) reviewing the patient's medical records including previous charts, labs and recent notes from other providers; 2) examining the patient and counseling them on their medical issues/explaining the plan of care; 3) adjusting meds as needed and 4) ordering lab work or other needed tests.    [name], MD 01/10/23

## 2023-01-12 ENCOUNTER — Other Ambulatory Visit (HOSPITAL_COMMUNITY): Payer: Self-pay

## 2023-01-12 DIAGNOSIS — Z7901 Long term (current) use of anticoagulants: Secondary | ICD-10-CM

## 2023-01-12 DIAGNOSIS — Z95811 Presence of heart assist device: Secondary | ICD-10-CM

## 2023-01-16 ENCOUNTER — Other Ambulatory Visit (HOSPITAL_COMMUNITY): Payer: Self-pay | Admitting: *Deleted

## 2023-01-16 DIAGNOSIS — Z95811 Presence of heart assist device: Secondary | ICD-10-CM

## 2023-01-16 DIAGNOSIS — I5022 Chronic systolic (congestive) heart failure: Secondary | ICD-10-CM

## 2023-01-16 DIAGNOSIS — N183 Chronic kidney disease, stage 3 unspecified: Secondary | ICD-10-CM

## 2023-01-16 DIAGNOSIS — Z7901 Long term (current) use of anticoagulants: Secondary | ICD-10-CM

## 2023-01-16 MED ORDER — PANTOPRAZOLE SODIUM 40 MG PO TBEC: 40.0000 mg | DELAYED_RELEASE_TABLET | Freq: Every day | ORAL | 3 refills | Status: DC

## 2023-01-16 MED ORDER — AMLODIPINE BESYLATE 5 MG PO TABS: 10.0000 mg | ORAL_TABLET | Freq: Every day | ORAL | 3 refills | Status: DC

## 2023-01-19 ENCOUNTER — Ambulatory Visit (HOSPITAL_COMMUNITY)
Admission: RE | Admit: 2023-01-19 | Discharge: 2023-01-19 | Disposition: A | Discharge status: Home / Self Care | Payer: Medicare Other | Source: Ambulatory Visit | Attending: Cardiology | Admitting: Cardiology

## 2023-01-19 ENCOUNTER — Ambulatory Visit (HOSPITAL_COMMUNITY): Payer: Self-pay | Admitting: Pharmacist

## 2023-01-19 DIAGNOSIS — Z95811 Presence of heart assist device: Secondary | ICD-10-CM | POA: Diagnosis not present

## 2023-01-19 DIAGNOSIS — Z7901 Long term (current) use of anticoagulants: Secondary | ICD-10-CM | POA: Insufficient documentation

## 2023-01-19 DIAGNOSIS — Z4801 Encounter for change or removal of surgical wound dressing: Secondary | ICD-10-CM | POA: Diagnosis present

## 2023-01-19 LAB — PROTIME-INR
INR: 1.8 — ABNORMAL HIGH (ref 0.8–1.2)
Prothrombin Time: 21.3 s — ABNORMAL HIGH (ref 11.4–15.2)

## 2023-01-19 NOTE — Progress Notes (Addendum)
 Patient presents to clinic today for lNR and drive line exit wound care alone.   Exit Site Care: Existing VAD dressing removed and site care performed using sterile technique. Drive line exit site cleaned with Chlora prep applicators x 2, RINSED WITH SALINE, and allowed to dry. and Sorbaview dressing with Silverlon applied. Exit site healed and fully incorporated, the velour is fully implanted at exit site. No drainage, tenderness, or foul odor. Drive line anchor re-applied. Pt denies fever or chills. Pt provided with 4 weekly kits for home use asked to bring one to each clinic visit.   Plan:  Return in 1 week for dressing change and INR.  Coumadin  dosing per Herschel JONETTA Schuyler Gladis RN,BSN VAD Coordinator  Office: [phone]  24/7 Pager: [phone]

## 2023-01-25 ENCOUNTER — Other Ambulatory Visit (HOSPITAL_COMMUNITY): Payer: Self-pay | Admitting: Unknown Physician Specialty

## 2023-01-25 ENCOUNTER — Ambulatory Visit (HOSPITAL_COMMUNITY)
Admission: RE | Admit: 2023-01-25 | Discharge: 2023-01-25 | Disposition: A | Discharge status: Home / Self Care | Payer: Medicare Other | Source: Ambulatory Visit | Attending: Internal Medicine | Admitting: Internal Medicine

## 2023-01-25 DIAGNOSIS — Z7901 Long term (current) use of anticoagulants: Secondary | ICD-10-CM

## 2023-01-25 DIAGNOSIS — Z95811 Presence of heart assist device: Secondary | ICD-10-CM

## 2023-01-25 DIAGNOSIS — Z4509 Encounter for adjustment and management of other cardiac device: Secondary | ICD-10-CM | POA: Insufficient documentation

## 2023-01-25 NOTE — Progress Notes (Signed)
 Patient presents to clinic today for drive line exit wound care alone.   Exit Site Care: Existing VAD dressing removed and site care performed using sterile technique. Drive line exit site cleaned with Chlora prep applicators x 2, RINSED WITH SALINE, and allowed to dry. and Sorbaview dressing with Silverlon applied. Exit site healed and fully incorporated, the velour is fully implanted at exit site. No drainage, tenderness, or foul odor. Drive line anchor re-applied. Pt denies fever or chills. Pt provided 3 anchors for home use.  Plan:  Return in 1 week for dressing change and INR.   Schuyler Lunger RN,BSN VAD Coordinator  Office: [phone]  24/7 Pager: [phone]

## 2023-01-26 ENCOUNTER — Encounter (HOSPITAL_COMMUNITY): Payer: Medicare Other

## 2023-02-02 ENCOUNTER — Other Ambulatory Visit (HOSPITAL_COMMUNITY): Payer: Medicare Other

## 2023-02-28 ENCOUNTER — Other Ambulatory Visit (HOSPITAL_COMMUNITY): Payer: Self-pay | Admitting: *Deleted

## 2023-02-28 DIAGNOSIS — Z95811 Presence of heart assist device: Secondary | ICD-10-CM

## 2023-02-28 DIAGNOSIS — Z7901 Long term (current) use of anticoagulants: Secondary | ICD-10-CM

## 2023-03-01 ENCOUNTER — Other Ambulatory Visit (HOSPITAL_COMMUNITY): Payer: Self-pay | Admitting: Internal Medicine

## 2023-03-01 DIAGNOSIS — E1169 Type 2 diabetes mellitus with other specified complication: Secondary | ICD-10-CM

## 2023-03-02 ENCOUNTER — Ambulatory Visit (HOSPITAL_COMMUNITY)
Admission: RE | Admit: 2023-03-02 | Discharge: 2023-03-02 | Disposition: A | Discharge status: Home / Self Care | Payer: Medicare Other | Source: Ambulatory Visit | Attending: Cardiology | Admitting: Cardiology

## 2023-03-02 ENCOUNTER — Ambulatory Visit (HOSPITAL_COMMUNITY): Payer: Self-pay | Admitting: Pharmacist

## 2023-03-02 DIAGNOSIS — Z95811 Presence of heart assist device: Secondary | ICD-10-CM | POA: Insufficient documentation

## 2023-03-02 DIAGNOSIS — Z4509 Encounter for adjustment and management of other cardiac device: Secondary | ICD-10-CM | POA: Diagnosis present

## 2023-03-02 DIAGNOSIS — Z7901 Long term (current) use of anticoagulants: Secondary | ICD-10-CM | POA: Insufficient documentation

## 2023-03-02 LAB — PROTIME-INR
INR: 2.1 — ABNORMAL HIGH (ref 0.8–1.2)
Prothrombin Time: 24 s — ABNORMAL HIGH (ref 11.4–15.2)

## 2023-03-02 NOTE — Progress Notes (Signed)
Patient presents to clinic today for drive line exit wound care and INR alone.   Exit Site Care: Existing VAD dressing removed and site care performed using sterile technique. Drive line exit site cleaned with Chlora prep applicators x 2, RINSED WITH SALINE, and allowed to dry. and Sorbaview dressing with Silverlon applied. Exit site healed and fully incorporated, the velour is fully implanted at exit site. No drainage, tenderness, or foul odor. Drive line anchor re-applied. Pt denies fever or chills. Pt has sufficient kits at home.  Plan:  Return in 1 week for dressing change and INR.   [name] RN,BSN VAD Coordinator  Office: [phone]  24/7 Pager: [phone]

## 2023-03-09 ENCOUNTER — Other Ambulatory Visit (HOSPITAL_COMMUNITY): Payer: Medicare Other

## 2023-03-16 ENCOUNTER — Other Ambulatory Visit (HOSPITAL_COMMUNITY): Payer: Self-pay

## 2023-03-16 DIAGNOSIS — Z95811 Presence of heart assist device: Secondary | ICD-10-CM

## 2023-03-16 DIAGNOSIS — E119 Type 2 diabetes mellitus without complications: Secondary | ICD-10-CM

## 2023-03-16 DIAGNOSIS — Z7901 Long term (current) use of anticoagulants: Secondary | ICD-10-CM

## 2023-03-20 ENCOUNTER — Ambulatory Visit (HOSPITAL_COMMUNITY): Payer: Self-pay | Admitting: Pharmacist

## 2023-03-20 ENCOUNTER — Ambulatory Visit (HOSPITAL_COMMUNITY)
Admission: RE | Admit: 2023-03-20 | Discharge: 2023-03-20 | Disposition: A | Discharge status: Home / Self Care | Payer: Medicare Other | Source: Ambulatory Visit | Attending: Internal Medicine | Admitting: Internal Medicine

## 2023-03-20 ENCOUNTER — Encounter (HOSPITAL_COMMUNITY): Payer: Self-pay | Admitting: Internal Medicine

## 2023-03-20 VITALS — BP 118/77 | HR 77 | Wt 257.4 lb

## 2023-03-20 DIAGNOSIS — Z7901 Long term (current) use of anticoagulants: Secondary | ICD-10-CM | POA: Diagnosis not present

## 2023-03-20 DIAGNOSIS — I4891 Unspecified atrial fibrillation: Secondary | ICD-10-CM | POA: Insufficient documentation

## 2023-03-20 DIAGNOSIS — I5042 Chronic combined systolic (congestive) and diastolic (congestive) heart failure: Secondary | ICD-10-CM | POA: Insufficient documentation

## 2023-03-20 DIAGNOSIS — Z7984 Long term (current) use of oral hypoglycemic drugs: Secondary | ICD-10-CM | POA: Insufficient documentation

## 2023-03-20 DIAGNOSIS — Z452 Encounter for adjustment and management of vascular access device: Secondary | ICD-10-CM | POA: Insufficient documentation

## 2023-03-20 DIAGNOSIS — Z95811 Presence of heart assist device: Secondary | ICD-10-CM | POA: Diagnosis not present

## 2023-03-20 DIAGNOSIS — E039 Hypothyroidism, unspecified: Secondary | ICD-10-CM | POA: Diagnosis not present

## 2023-03-20 DIAGNOSIS — Z7982 Long term (current) use of aspirin: Secondary | ICD-10-CM | POA: Diagnosis not present

## 2023-03-20 DIAGNOSIS — I130 Hypertensive heart and chronic kidney disease with heart failure and stage 1 through stage 4 chronic kidney disease, or unspecified chronic kidney disease: Secondary | ICD-10-CM | POA: Diagnosis not present

## 2023-03-20 DIAGNOSIS — E119 Type 2 diabetes mellitus without complications: Secondary | ICD-10-CM

## 2023-03-20 DIAGNOSIS — N1831 Chronic kidney disease, stage 3a: Secondary | ICD-10-CM | POA: Diagnosis not present

## 2023-03-20 DIAGNOSIS — I480 Paroxysmal atrial fibrillation: Secondary | ICD-10-CM

## 2023-03-20 DIAGNOSIS — E1122 Type 2 diabetes mellitus with diabetic chronic kidney disease: Secondary | ICD-10-CM | POA: Diagnosis not present

## 2023-03-20 DIAGNOSIS — I5022 Chronic systolic (congestive) heart failure: Secondary | ICD-10-CM | POA: Diagnosis not present

## 2023-03-20 DIAGNOSIS — Z8249 Family history of ischemic heart disease and other diseases of the circulatory system: Secondary | ICD-10-CM | POA: Diagnosis not present

## 2023-03-20 DIAGNOSIS — I10 Essential (primary) hypertension: Secondary | ICD-10-CM

## 2023-03-20 DIAGNOSIS — I428 Other cardiomyopathies: Secondary | ICD-10-CM | POA: Diagnosis not present

## 2023-03-20 LAB — CBC
HCT: 45 % (ref 39.0–52.0)
Hemoglobin: 14.8 g/dL (ref 13.0–17.0)
MCH: 28 pg (ref 26.0–34.0)
MCHC: 32.9 g/dL (ref 30.0–36.0)
MCV: 85.1 fL (ref 80.0–100.0)
Platelets: 239 10*3/uL (ref 150–400)
RBC: 5.29 MIL/uL (ref 4.22–5.81)
RDW: 15.2 % (ref 11.5–15.5)
WBC: 7.9 10*3/uL (ref 4.0–10.5)
nRBC: 0 % (ref 0.0–0.2)

## 2023-03-20 LAB — HEMOGLOBIN A1C
Hgb A1c MFr Bld: 11.7 % — ABNORMAL HIGH (ref 4.8–5.6)
Mean Plasma Glucose: 289.09 mg/dL

## 2023-03-20 LAB — PROTIME-INR
INR: 2.1 — ABNORMAL HIGH (ref 0.8–1.2)
Prothrombin Time: 23.8 s — ABNORMAL HIGH (ref 11.4–15.2)

## 2023-03-20 NOTE — Progress Notes (Addendum)
 Patient presents for 2 month follow up with 6 yr Intermacs and annual maintenance in VAD Clinic today alone. Reports no problems with VAD equipment or concerns with drive line.   Pt states that he is feeling good and has been playing golf, but is taking a break for the winter months. He denies lightheadedness, dizziness, falls, heart failure symptoms, and signs of bleeding.  Reports he is taking medications as prescribed. Reports he is not checking his blood sugars at home; stressed importance of diabetes management and endocrinology follow up. He verbalized understanding. A1C- 11.7 today.   BMET/LDH/Prealbumin/Mag hemolyzed in lab.   Provided with 2 new sets of batteries today.   Vital Signs:        Doppler Pressure: 100 Automatc BP: 118/77 (90)  HR: 77 SPO2: 97% on RA        Weight: 259.0 lb w/ eqt  Last weight: 259.4 lb w/ ept   VAD Indication: Destination Therapy - due to limited social support.    LVAD assessment:      Speed: 5300 RPM      Flow: 4.4      Power: 5.0 w       PI: 3.9  Alarms: few LV Events:  30 - 40 PI events daily  Hct: 20       Fixed speed: 5300 Low speed limit: 5000  Primary Controller:  Replace back up battery in 19 months.  Back up controller: Replace back up battery in 25 months.  I reviewed the LVAD parameters from today and compared the results to the patient's prior recorded data. LVAD interrogation was STABLE for significant power changes, NEGATIVE for clinical alarms and STABLE for PI events/speed drops. No programming changes were made and pump is functioning within specified parameters.    LVAD equipment check completed and is in good working order. Back-up equipment present.   Annual maintenance completed on patient's home equipment per [facility] 04/28/21.  Exit Site Care:  Existing VAD dressing removed and site care performed using sterile technique. Drive line exit site cleaned with Chlora prep applicators x 2, allowed to dry, and silverlon  patch w/ Sorbaview dressing applied. Exit site healed and incorporated, the velour is fully implanted at exit site. No drainage, redness, tenderness, foul odor noted. Drive line anchor repositioned and re-applied. Pt denies fever or chills. Provided with 4 weekly kits and 5 anchors for home use.   Device:  N/A   BP & Labs:  Doppler 100 - reflecting Modified systolic   Hgb 14.8 - No S/S of bleeding. Specifically denies melena/BRBPR or nosebleeds.   LDH hemolyzed today - established baseline of 200 - 400. Denies tea-colored urine. No power elevations noted on interrogation other than above noted incidents.    Batteries Manufacture Date: Number of uses: Re-calibration  04/28/20 24 - 300 Performed by patient   Annual maintenance completed per [employer] on patient's home MPU and universal Magazine features editor.    Backup system controller 11 volt battery charged during visit.   6 Intermacs follow up completed including:  Quality of Life, KCCQ-12, and Neurocognitive trail making.   Pt completed 1500 feet during 6 minute walk.  Patient Goals: To start playing more golf as the weather gets warmer.  [place] Cardiomyopathy Questionnaire     03/20/2023   10:31 AM 11/17/2022    2:25 PM 04/14/2022    1:07 PM  KCCQ-12  1 a. Ability to shower/bathe Slightly limited Not at all limited Not at all limited  1  b. Ability to walk 1 block Not at all limited Not at all limited Not at all limited  1 c. Ability to hurry/jog Not at all limited Not at all limited Not at all limited  2. Edema feet/ankles/legs Less than once a week Less than once a week Never over the past 2 weeks  3. Limited by fatigue Never over the past 2 weeks Less than once a week Never over the past 2 weeks  4. Limited by dyspnea Never over the past 2 weeks Less than once a week Less than once a week  5. Sitting up / on 3+ pillows Never over the past 2 weeks Never over the past 2 weeks Never over the past 2 weeks  6. Limited enjoyment of life  Slightly limited Slightly limited Moderately limited  7. Rest of life w/ symptoms Mostly dissatisfied Mostly dissatisfied Mostly dissatisfied  8 a. Participation in hobbies Did not limit at all Slightly limited Did not limit at all  8 b. Participation in chores Did not limit at all Did not limit at all Did not limit at all  8 c. Visiting family/friends Did not limit at all Did not limit at all Did not limit at all     Patient Instructions:  No medication changes today Coumadin dosing per Lauren PharmD Return to VAD clinic in 2 months  Return to VAD clinic in 1 week for dressing change  Alyce Pagan RN VAD Coordinator  Office: [phone]  24/7 Pager: [phone]

## 2023-03-20 NOTE — Patient Instructions (Addendum)
No medication changes today Coumadin dosing per Lauren PharmD Return to VAD clinic in 2 months 

## 2023-03-21 ENCOUNTER — Other Ambulatory Visit (HOSPITAL_COMMUNITY): Payer: Self-pay

## 2023-03-21 DIAGNOSIS — Z95811 Presence of heart assist device: Secondary | ICD-10-CM

## 2023-03-21 DIAGNOSIS — Z7901 Long term (current) use of anticoagulants: Secondary | ICD-10-CM

## 2023-03-21 NOTE — Progress Notes (Signed)
 LVAD Clinic Note   Primary Cardiologist: Dr. Gala Romney   HPI: Benjamin Sherman is a 53 y.o. 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 ECHO showed severely reduced EF. 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 Placed on steroids without benefit.  Admitted 11/18 after fall/syncopal episode resulting in bifrontal SAH. Neurosurgery consulted.   Admitted 4/19 with cardiogenic shock in setting of AFL. Supported with inotropes and underwent DC-CV but had persistent shock. After numerous discussions about concern for adequate social support underwent placement of HM-3 LVAD on 05/15/17. D/c home 06/04/17  Here for routine f/u. Doing well. Reamins active with golf and other activities as weather permits. Denies orthopnea or PND. No fevers, chills or problems with driveline. No bleeding, melena or neuro symptoms. No VAD alarms. Taking all meds as prescribed.     VAD Indication: Destination Therapy - due to limited social support.    LVAD assessment:                                                    Speed: 5300 RPM                                                       Flow: 4.4                                                  Power: 5.0 w                               PI: 3.9   Alarms: few LV Events:  30 - 40 PI events daily  Hct: 20                                                              Fixed speed: 5300 Low speed limit: 5000   Primary Controller:  Replace back up battery in 19 months.  Back up controller: Replace back up battery in 25 months.   I reviewed the LVAD parameters from today and compared the results to the patient's prior recorded data. LVAD interrogation was STABLE for significant power changes, NEGATIVE for clinical alarms and STABLE for PI events/speed drops. No programming changes were made and pump is  functioning within specified parameters.    LVAD equipment check completed and is in good working order. Back-up equipment present.     Past Medical History:  Diagnosis Date   CHF (congestive heart failure) (HCC)    Diabetes mellitus without complication (HCC)     Current Outpatient Medications  Medication Sig Dispense Refill   amLODipine (NORVASC) 5 MG tablet Take 2 tablets (10 mg total) by  mouth daily. 180 tablet 3   aspirin 81 MG EC tablet Take 1 tablet (81 mg total) by mouth daily. 90 tablet 3   Blood Glucose Monitoring Suppl (TRUE METRIX METER) DEVI 1 each by Does not apply route daily with breakfast. 1 Device 0   glipiZIDE (GLUCOTROL) 10 MG tablet TAKE (1) TABLET TWICE DAILY, BEFORE MEALS. 180 tablet 3   glucose blood (TRUE METRIX BLOOD GLUCOSE TEST) test strip Use daily before breakfast 30 each 12   pantoprazole (PROTONIX) 40 MG tablet Take 1 tablet (40 mg total) by mouth daily. 90 tablet 3   sacubitril-valsartan (ENTRESTO) 49-51 MG TAKE (1) TABLET BY MOUTH TWICE DAILY. 180 tablet 3   TRUEPLUS LANCETS 28G MISC 1 each by Does not apply route daily. 30 each 12   warfarin (COUMADIN) 6 MG tablet 3 mg (6 mg x 0.5) every Tue, Thu; 6 mg (6 mg x 1) all other days 90 tablet 3   No current facility-administered medications for this encounter.   Allergies  Allergen Reactions   Bee Venom     UNSPECIFIED REACTION    Social History   Socioeconomic History   Marital status: Single    Spouse name: Not on file   Number of children: Not on file   Years of education: Not on file   Highest education level: Not on file  Occupational History   Occupation: security  Tobacco Use   Smoking status: Former    Types: Cigars    Quit date: 11/16/2016    Years since quitting: 6.3   Smokeless tobacco: Never   Tobacco comments:    6 cigars per wk  Vaping Use   Vaping status: Never Used  Substance and Sexual Activity   Alcohol use: Yes    Alcohol/week: 4.0 standard drinks of alcohol     Types: 2 Shots of liquor, 2 Cans of beer per week    Comment: ocasional   Drug use: No   Sexual activity: Not Currently  Other Topics Concern   Not on file  Social History Narrative   Not on file   Social Drivers of Health   Financial Resource Strain: Low Risk  (03/25/2020)   Overall Financial Resource Strain (CARDIA)    Difficulty of Paying Living Expenses: Not very hard  Food Insecurity: No Food Insecurity (03/25/2020)   Hunger Vital Sign    Worried About Running Out of Food in the Last Year: Never true    Ran Out of Food in the Last Year: Never true  Transportation Needs: No Transportation Needs (03/25/2020)   PRAPARE - Administrator, Civil Service (Medical): No    Lack of Transportation (Non-Medical): No  Physical Activity: Not on file  Stress: Not on file  Social Connections: Not on file  Intimate Partner Violence: Not on file    Family History  Problem Relation Age of Onset   CAD Father    Vitals:   03/20/23 0935 03/20/23 0953  BP: (!) 100/0 118/77  Pulse: 77   SpO2: 97%   Weight: 116.8 kg (257 lb 6.4 oz)     Wt Readings from Last 3 Encounters:  03/20/23 116.8 kg (257 lb 6.4 oz)  01/08/23 117.5 kg (259 lb)  11/17/22 117.7 kg (259 lb 6.4 oz)      Vital Signs:  Doppler Pressure: 100 Automatc BP: 1118/77 (90)    HR: 77 SPO2: 97% on RA                                                         Weight: 259.0 lb w/ eqt  Last weight: 259.4 lb w/ ept    Physical Exam: General:  NAD.  HEENT: normal  Neck: supple. JVP not elevated.  Carotids 2+ bilat; no bruits. No lymphadenopathy or thryomegaly appreciated. Cor: LVAD hum.  Lungs: Clear. Abdomen: obese soft, nontender, non-distended. No hepatosplenomegaly. No bruits or masses. Good bowel sounds. Driveline site clean. Anchor in place.  Extremities: no cyanosis, clubbing, rash. Warm no edema  Neuro: alert & oriented x 3. No focal deficits.  Moves all 4 without problem    ASSESSMENT & PLAN:  1. Chronic Combined Systolic/Diastolic Heart Failure. NICM  - ECHO 4/19 EF 20% CMRI EF 22%  - s/p HM-3 LVAD on 05/15/17 - Doing well. NYHA I - Volume looks good  2. VAD management.  - VAD interrogated personally. Parameters stable. - Remains on ASA 81 mg daily + coumadin. - INR 2.1 INR goal 2.0-2.5 Discussed warfarin dosing with PharmD personally. 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 Off amio - Continue coumadin - No bleeding  4. HTN - MAPs ok  5. DMII - Sugars remain elevated. CBG 360 today. Fortunately last HgbA1c down to 7.6  - Doubt he will tolerate SGLT2i with volume issues.  - Continue glipizide 10 bid. Unable to tolerate metformin due to diarrhea - Referred to Endo but he did not go. Encouraged him to f/u and to check blood sugars routinely - Check A1c at next visit  6. CKD 3a - Creatinine baseline 1.5-1.8 - Scr stable t.2 today  7. Golf game - says he needs a new set of clubs for hs bday,   I spent a total of 41 minutes today: 1) reviewing the patient's medical records including previous charts, labs and recent notes from other providers; 2) examining the patient and counseling them on their medical issues/explaining the plan of care; 3) adjusting meds as needed and 4) ordering lab work or other needed tests.    [name], MD 03/21/23

## 2023-03-30 ENCOUNTER — Ambulatory Visit (HOSPITAL_COMMUNITY)
Admission: RE | Admit: 2023-03-30 | Discharge: 2023-03-30 | Disposition: A | Discharge status: Home / Self Care | Source: Ambulatory Visit | Attending: Cardiology | Admitting: Cardiology

## 2023-03-30 ENCOUNTER — Ambulatory Visit (HOSPITAL_COMMUNITY): Payer: Self-pay | Admitting: Pharmacist

## 2023-03-30 DIAGNOSIS — Z7901 Long term (current) use of anticoagulants: Secondary | ICD-10-CM | POA: Diagnosis not present

## 2023-03-30 DIAGNOSIS — Z95811 Presence of heart assist device: Secondary | ICD-10-CM | POA: Diagnosis present

## 2023-03-30 DIAGNOSIS — Z4509 Encounter for adjustment and management of other cardiac device: Secondary | ICD-10-CM | POA: Diagnosis present

## 2023-03-30 LAB — PROTIME-INR
INR: 2.1 — ABNORMAL HIGH (ref 0.8–1.2)
Prothrombin Time: 24 s — ABNORMAL HIGH (ref 11.4–15.2)

## 2023-03-30 NOTE — Progress Notes (Signed)
 Patient presents to clinic today for drive line exit wound care and INR alone.   Exit Site Care: Existing VAD dressing removed and site care performed using sterile technique. Drive line exit site cleaned with Chlora prep applicators x 2, RINSED WITH SALINE, and allowed to dry. and Sorbaview dressing with Silverlon applied. Exit site healed and fully incorporated, the velour is fully implanted at exit site. No drainage, tenderness, or foul odor. Drive line anchor re-applied. Pt denies fever or chills. Pt has sufficient kits at home.  Plan:  Return in 1 week for dressing change and INR.   [name] RN,BSN VAD Coordinator  Office: [phone]  24/7 Pager: [phone]

## 2023-03-30 NOTE — Addendum Note (Signed)
 Encounter addended by: Lebron Quam, RN on: 03/30/2023 9:59 AM  Actions taken: Charge Capture section accepted

## 2023-04-06 ENCOUNTER — Encounter (HOSPITAL_COMMUNITY)

## 2023-04-18 ENCOUNTER — Other Ambulatory Visit (HOSPITAL_COMMUNITY): Payer: Self-pay | Admitting: Unknown Physician Specialty

## 2023-04-18 DIAGNOSIS — Z7901 Long term (current) use of anticoagulants: Secondary | ICD-10-CM

## 2023-04-18 DIAGNOSIS — Z95811 Presence of heart assist device: Secondary | ICD-10-CM

## 2023-04-20 ENCOUNTER — Ambulatory Visit (HOSPITAL_COMMUNITY)
Admission: RE | Admit: 2023-04-20 | Discharge: 2023-04-20 | Disposition: A | Discharge status: Home / Self Care | Source: Ambulatory Visit | Attending: Internal Medicine | Admitting: Internal Medicine

## 2023-04-20 ENCOUNTER — Ambulatory Visit (HOSPITAL_COMMUNITY): Payer: Self-pay | Admitting: Pharmacist

## 2023-04-20 DIAGNOSIS — Z4801 Encounter for change or removal of surgical wound dressing: Secondary | ICD-10-CM | POA: Insufficient documentation

## 2023-04-20 DIAGNOSIS — Z95811 Presence of heart assist device: Secondary | ICD-10-CM | POA: Diagnosis not present

## 2023-04-20 DIAGNOSIS — Z7901 Long term (current) use of anticoagulants: Secondary | ICD-10-CM | POA: Insufficient documentation

## 2023-04-20 LAB — PROTIME-INR
INR: 1.6 — ABNORMAL HIGH (ref 0.8–1.2)
Prothrombin Time: 19.6 s — ABNORMAL HIGH (ref 11.4–15.2)

## 2023-04-20 NOTE — Progress Notes (Addendum)
 Patient presents to clinic today for drive line exit wound care and INR alone.   Exit Site Care: Existing VAD dressing removed and site care performed using sterile technique. Drive line exit site cleaned with Chlora prep applicators x 2, RINSED WITH SALINE, and allowed to dry. and Sorbaview dressing with Silverlon applied. Exit site healed and fully incorporated, the velour is fully implanted at exit site. No drainage, tenderness, or foul odor. Drive line anchor re-applied. Pt denies fever or chills. Pt given 4 weekly kits and 4 anchors. Pt asked to bring one to each appointment for dressing care.  Plan:  Return in 1 week for dressing change and INR.   Simmie Davies RN,BSN VAD Coordinator  Office: [phone]  24/7 Pager: [phone]

## 2023-04-27 ENCOUNTER — Ambulatory Visit (HOSPITAL_COMMUNITY)
Admission: RE | Admit: 2023-04-27 | Discharge: 2023-04-27 | Disposition: A | Discharge status: Home / Self Care | Source: Ambulatory Visit | Attending: Cardiology | Admitting: Cardiology

## 2023-04-27 ENCOUNTER — Ambulatory Visit (HOSPITAL_COMMUNITY): Payer: Self-pay | Admitting: Pharmacist

## 2023-04-27 DIAGNOSIS — Z7901 Long term (current) use of anticoagulants: Secondary | ICD-10-CM | POA: Insufficient documentation

## 2023-04-27 DIAGNOSIS — Z95811 Presence of heart assist device: Secondary | ICD-10-CM | POA: Insufficient documentation

## 2023-04-27 DIAGNOSIS — Z4509 Encounter for adjustment and management of other cardiac device: Secondary | ICD-10-CM | POA: Diagnosis present

## 2023-04-27 LAB — PROTIME-INR
INR: 2.2 — ABNORMAL HIGH (ref 0.8–1.2)
Prothrombin Time: 24.3 s — ABNORMAL HIGH (ref 11.4–15.2)

## 2023-04-27 NOTE — Progress Notes (Signed)
 Patient presents to clinic today for drive line exit wound care and INR alone.   Exit Site Care: Existing VAD dressing removed and site care performed using sterile technique. Drive line exit site cleaned with Chlora prep applicators x 2, RINSED WITH SALINE, and allowed to dry. and Sorbaview dressing with Silverlon applied. Exit site healed and fully incorporated, the velour is fully implanted at exit site. No drainage, tenderness, or foul odor. Drive line anchor re-applied. Pt denies fever or chills. Pt has adequate dressing supplies at home.  Plan:  Return in 1 week for dressing change Coumadin dosing per Lauren PharmD   Alyce Pagan RN VAD Coordinator  Office: [phone]  24/7 Pager: [phone]

## 2023-05-04 ENCOUNTER — Encounter (HOSPITAL_COMMUNITY)

## 2023-05-09 ENCOUNTER — Other Ambulatory Visit (HOSPITAL_COMMUNITY): Payer: Self-pay | Admitting: *Deleted

## 2023-05-09 DIAGNOSIS — Z95811 Presence of heart assist device: Secondary | ICD-10-CM

## 2023-05-09 DIAGNOSIS — Z7901 Long term (current) use of anticoagulants: Secondary | ICD-10-CM

## 2023-05-10 ENCOUNTER — Encounter (HOSPITAL_COMMUNITY)

## 2023-05-10 ENCOUNTER — Telehealth (HOSPITAL_COMMUNITY): Payer: Self-pay

## 2023-05-10 NOTE — Telephone Encounter (Signed)
 Pt called to cancel his appointment for today for a dressing change as he is out of town. Pt states he will call VAD Clinic next week to reschedule.  Laurice Pope RN, BSN VAD Coordinator 24/7 Pager [phone]

## 2023-05-18 ENCOUNTER — Ambulatory Visit (HOSPITAL_COMMUNITY)
Admission: RE | Admit: 2023-05-18 | Discharge: 2023-05-18 | Disposition: A | Discharge status: Home / Self Care | Source: Ambulatory Visit | Attending: Cardiology | Admitting: Cardiology

## 2023-05-18 DIAGNOSIS — Z95811 Presence of heart assist device: Secondary | ICD-10-CM | POA: Diagnosis present

## 2023-05-18 DIAGNOSIS — Z7901 Long term (current) use of anticoagulants: Secondary | ICD-10-CM | POA: Insufficient documentation

## 2023-05-18 NOTE — Progress Notes (Addendum)
 Patient presents to clinic today for drive line exit wound care  alone.   Exit Site Care: Existing VAD dressing removed and site care performed using sterile technique. Drive line exit site cleaned with Chlora prep applicators x 2, RINSED WITH SALINE, and allowed to dry. and Sorbaview dressing with Silverlon applied. Exit site healed and fully incorporated, the velour is fully implanted at exit site. No drainage, tenderness, or foul odor. Drive line anchor re-applied. Pt denies fever or chills. Pt provided with 4 weekly kits and 5 extra anchors for home use.   Plan:  Return in 1 week for full visit with Dr Julane Ny   Paulo Bosworth RN VAD Coordinator  Office: [phone]  24/7 Pager: [phone]

## 2023-05-24 ENCOUNTER — Other Ambulatory Visit (HOSPITAL_COMMUNITY): Payer: Self-pay | Admitting: *Deleted

## 2023-05-24 DIAGNOSIS — I5022 Chronic systolic (congestive) heart failure: Secondary | ICD-10-CM

## 2023-05-24 DIAGNOSIS — Z7901 Long term (current) use of anticoagulants: Secondary | ICD-10-CM

## 2023-05-24 DIAGNOSIS — Z95811 Presence of heart assist device: Secondary | ICD-10-CM

## 2023-05-25 ENCOUNTER — Encounter (HOSPITAL_COMMUNITY): Payer: Self-pay | Admitting: Internal Medicine

## 2023-05-25 ENCOUNTER — Ambulatory Visit (HOSPITAL_COMMUNITY)
Admission: RE | Admit: 2023-05-25 | Discharge: 2023-05-25 | Disposition: A | Discharge status: Home / Self Care | Source: Ambulatory Visit | Attending: Internal Medicine | Admitting: Internal Medicine

## 2023-05-25 ENCOUNTER — Ambulatory Visit (HOSPITAL_COMMUNITY): Payer: Self-pay | Admitting: Pharmacist

## 2023-05-25 VITALS — BP 92/78 | HR 77 | Wt 257.0 lb

## 2023-05-25 DIAGNOSIS — Z7984 Long term (current) use of oral hypoglycemic drugs: Secondary | ICD-10-CM | POA: Diagnosis not present

## 2023-05-25 DIAGNOSIS — E1122 Type 2 diabetes mellitus with diabetic chronic kidney disease: Secondary | ICD-10-CM | POA: Diagnosis not present

## 2023-05-25 DIAGNOSIS — I130 Hypertensive heart and chronic kidney disease with heart failure and stage 1 through stage 4 chronic kidney disease, or unspecified chronic kidney disease: Secondary | ICD-10-CM | POA: Insufficient documentation

## 2023-05-25 DIAGNOSIS — I4892 Unspecified atrial flutter: Secondary | ICD-10-CM | POA: Insufficient documentation

## 2023-05-25 DIAGNOSIS — R21 Rash and other nonspecific skin eruption: Secondary | ICD-10-CM | POA: Diagnosis not present

## 2023-05-25 DIAGNOSIS — I428 Other cardiomyopathies: Secondary | ICD-10-CM | POA: Diagnosis not present

## 2023-05-25 DIAGNOSIS — I5022 Chronic systolic (congestive) heart failure: Secondary | ICD-10-CM | POA: Diagnosis present

## 2023-05-25 DIAGNOSIS — Z7982 Long term (current) use of aspirin: Secondary | ICD-10-CM | POA: Insufficient documentation

## 2023-05-25 DIAGNOSIS — I5042 Chronic combined systolic (congestive) and diastolic (congestive) heart failure: Secondary | ICD-10-CM | POA: Insufficient documentation

## 2023-05-25 DIAGNOSIS — E1169 Type 2 diabetes mellitus with other specified complication: Secondary | ICD-10-CM

## 2023-05-25 DIAGNOSIS — Z7901 Long term (current) use of anticoagulants: Secondary | ICD-10-CM | POA: Insufficient documentation

## 2023-05-25 DIAGNOSIS — Z87891 Personal history of nicotine dependence: Secondary | ICD-10-CM | POA: Insufficient documentation

## 2023-05-25 DIAGNOSIS — Z95811 Presence of heart assist device: Secondary | ICD-10-CM | POA: Insufficient documentation

## 2023-05-25 DIAGNOSIS — N1831 Chronic kidney disease, stage 3a: Secondary | ICD-10-CM | POA: Insufficient documentation

## 2023-05-25 DIAGNOSIS — N1832 Chronic kidney disease, stage 3b: Secondary | ICD-10-CM

## 2023-05-25 LAB — COMPREHENSIVE METABOLIC PANEL WITH GFR
ALT: 21 U/L (ref 0–44)
AST: 22 U/L (ref 15–41)
Albumin: 3.2 g/dL — ABNORMAL LOW (ref 3.5–5.0)
Alkaline Phosphatase: 36 U/L — ABNORMAL LOW (ref 38–126)
Anion gap: 10 (ref 5–15)
BUN: 24 mg/dL — ABNORMAL HIGH (ref 6–20)
CO2: 26 mmol/L (ref 22–32)
Calcium: 9.6 mg/dL (ref 8.9–10.3)
Chloride: 99 mmol/L (ref 98–111)
Creatinine, Ser: 1.51 mg/dL — ABNORMAL HIGH (ref 0.61–1.24)
GFR, Estimated: 55 mL/min — ABNORMAL LOW (ref >60)
Glucose, Bld: 298 mg/dL — ABNORMAL HIGH (ref 70–99)
Potassium: 5.4 mmol/L — ABNORMAL HIGH (ref 3.5–5.1)
Sodium: 135 mmol/L (ref 135–145)
Total Bilirubin: 0.8 mg/dL (ref 0.0–1.2)
Total Protein: 7.5 g/dL (ref 6.5–8.1)

## 2023-05-25 LAB — CBC
HCT: 44.4 % (ref 39.0–52.0)
Hemoglobin: 14.8 g/dL (ref 13.0–17.0)
MCH: 28.1 pg (ref 26.0–34.0)
MCHC: 33.3 g/dL (ref 30.0–36.0)
MCV: 84.3 fL (ref 80.0–100.0)
Platelets: 227 10*3/uL (ref 150–400)
RBC: 5.27 MIL/uL (ref 4.22–5.81)
RDW: 14.9 % (ref 11.5–15.5)
WBC: 6.8 10*3/uL (ref 4.0–10.5)
nRBC: 0 % (ref 0.0–0.2)

## 2023-05-25 LAB — PROTIME-INR
INR: 2.3 — ABNORMAL HIGH (ref 0.8–1.2)
Prothrombin Time: 25.9 s — ABNORMAL HIGH (ref 11.4–15.2)

## 2023-05-25 LAB — LACTATE DEHYDROGENASE: LDH: 277 U/L — ABNORMAL HIGH (ref 98–192)

## 2023-05-25 LAB — PREALBUMIN: Prealbumin: 28 mg/dL (ref 18–38)

## 2023-05-25 LAB — MAGNESIUM: Magnesium: 2.2 mg/dL (ref 1.7–2.4)

## 2023-05-25 MED ORDER — NYSTATIN 100000 UNIT/GM EX CREA: 1.0000 | TOPICAL_CREAM | Freq: Two times a day (BID) | CUTANEOUS | 0 refills | Status: AC

## 2023-05-25 NOTE — Patient Instructions (Addendum)
 No medication changes today Coumadin  dosing per Barbra Ley PharmD Return to VAD clinic in 2 months  Return to VAD clinic in 1 week for dressing change May use nystatin cream on rash under previous anchor site

## 2023-05-25 NOTE — Progress Notes (Addendum)
 Patient presents for 2 month follow up in VAD Clinic today alone. Reports no problems with VAD equipment or concerns with drive line.   Pt states that he is feeling good and has been playing golf. He denies lightheadedness, dizziness, falls, heart failure symptoms, and signs of bleeding.  Reports he is taking medications as prescribed. Reports he is not checking his blood sugars at home; stressed importance of diabetes management and endocrinology follow up. He verbalized understanding.   Rash present under previous anchor site. Thought to be due to sweating under tegaderm. Large tegaderm left off dressing this week to allow previous anchor site to be open to air. Repositioned anchor placement to leave previous site open to air. May use nystatin cream on rash per Dr Julane Ny.   BMET- possible hemolysis per lab note on result  Vital Signs:        Doppler Pressure: 104 Automatc BP: 92/78 (84)  HR: 77 SPO2: 99% on RA        Weight: 257.0 lb w/ eqt  Last weight: 259.0 lb w/ ept   VAD Indication: Destination Therapy - due to limited social support.    LVAD assessment:      Speed: 5300 RPM      Flow: 4.4      Power: 4.0 w       PI: 5.0  Alarms: few LV Events:  20 - 30 PI events daily  Hct: 20       Fixed speed: 5300 Low speed limit: 5000  Primary Controller:  Replace back up battery in 17 months.  Back up controller: Replace back up battery in 25 months.  I reviewed the LVAD parameters from today and compared the results to the patient's prior recorded data. LVAD interrogation was STABLE for significant power changes, NEGATIVE for clinical alarms and STABLE for PI events/speed drops. No programming changes were made and pump is functioning within specified parameters.    LVAD equipment check completed and is in good working order. Back-up equipment present.   Annual maintenance completed on patient's home equipment per [facility] 03/20/23.  Exit Site Care:  Existing VAD dressing  removed and site care performed using sterile technique. Drive line exit site cleaned with Chlora prep applicators x 2, allowed to dry, and silverlon patch w/ Sorbaview dressing applied. Exit site healed and incorporated, the velour is fully implanted at exit site. No drainage, redness, tenderness, foul odor noted. Rash noted under previous anchor site. Drive line anchor repositioned and re-applied- will trial cath grip anchor this week. Pt denies fever or chills. Pt has adequate dressing supplies at home.    Device:  N/A   BP & Labs:  Doppler 104- reflecting Modified systolic   Hgb 14.8- No S/S of bleeding. Specifically denies melena/BRBPR or nosebleeds.   LDH 277- established baseline of 200 - 400. Denies tea-colored urine. No power elevations noted on interrogation other than above noted incidents.    Patient Instructions:  No medication changes today Coumadin  dosing per Lauren PharmD Return to VAD clinic in 2 months  Return to VAD clinic in 1 week for dressing change  Paulo Bosworth RN VAD Coordinator  Office: [phone]  24/7 Pager: [phone]

## 2023-05-25 NOTE — Progress Notes (Incomplete)
 LVAD Clinic Note   Primary Cardiologist: Dr. Julane Ny   HPI: Benjamin Sherman is a 53 y.o. 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 ECHO showed severely reduced EF. 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 Placed on steroids without benefit.  Admitted 11/18 after fall/syncopal episode resulting in bifrontal SAH. Neurosurgery consulted.   Admitted 4/19 with cardiogenic shock in setting of AFL. Supported with inotropes and underwent DC-CV but had persistent shock. After numerous discussions about concern for adequate social support underwent placement of HM-3 LVAD on 05/15/17. D/c home 06/04/17  Here for routine f/u. Doing well. Reamins active with golf and other activities as weather permits. Denies orthopnea or PND. No fevers, chills or problems with driveline. No bleeding, melena or neuro symptoms. No VAD alarms. Taking all meds as prescribed.     Vital Signs:                                                                 Doppler Pressure: 104 Automatc BP: 92/78 (84)         HR: 77 SPO2: 99% on RA                                                         Weight: 257.0 lb w/ eqt  Last weight: 259.0 lb w/ ept   VAD Indication: Destination Therapy - due to limited social support.    LVAD assessment:                                                    Speed: 5300 RPM                                                       Flow: 4.4                                                          Power: 4.0 w                               PI: 5.0   Alarms: few LV Events:  20 - 30 PI events daily  Hct: 20  Fixed speed: 5300 Low speed limit: 5000   Primary Controller:  Replace back up battery in 17 months.  Back up controller: Replace back up battery in 25  months.   I reviewed the LVAD parameters from today and compared the results to the patient's prior recorded data. LVAD interrogation was STABLE for significant power changes, NEGATIVE for clinical alarms and STABLE for PI events/speed drops. No programming changes were made and pump is functioning within specified parameters.    LVAD equipment check completed and is in good working order. Back-up equipment present.    Annual maintenance completed on patient's home equipment per [facility] 03/20/23.    Past Medical History:  Diagnosis Date   CHF (congestive heart failure) (HCC)    Diabetes mellitus without complication (HCC)     Current Outpatient Medications  Medication Sig Dispense Refill   amLODipine  (NORVASC ) 5 MG tablet Take 2 tablets (10 mg total) by mouth daily. 180 tablet 3   aspirin  81 MG EC tablet Take 1 tablet (81 mg total) by mouth daily. 90 tablet 3   glipiZIDE  (GLUCOTROL ) 10 MG tablet TAKE (1) TABLET TWICE DAILY, BEFORE MEALS. 180 tablet 3   nystatin cream (MYCOSTATIN) Apply 1 Application topically 2 (two) times daily. 30 g 0   pantoprazole  (PROTONIX ) 40 MG tablet Take 1 tablet (40 mg total) by mouth daily. 90 tablet 3   sacubitril -valsartan  (ENTRESTO ) 49-51 MG TAKE (1) TABLET BY MOUTH TWICE DAILY. 180 tablet 3   warfarin (COUMADIN ) 6 MG tablet 3 mg (6 mg x 0.5) every Tue, Thu; 6 mg (6 mg x 1) all other days 90 tablet 3   Blood Glucose Monitoring Suppl (TRUE METRIX METER) DEVI 1 each by Does not apply route daily with breakfast. (Patient not taking: Reported on 05/25/2023) 1 Device 0   glucose blood (TRUE METRIX BLOOD GLUCOSE TEST) test strip Use daily before breakfast (Patient not taking: Reported on 05/25/2023) 30 each 12   TRUEPLUS LANCETS 28G MISC 1 each by Does not apply route daily. (Patient not taking: Reported on 05/25/2023) 30 each 12   No current facility-administered medications for this encounter.   Allergies  Allergen Reactions   Bee Venom     UNSPECIFIED REACTION     Social History   Socioeconomic History   Marital status: Single    Spouse name: Not on file   Number of children: Not on file   Years of education: Not on file   Highest education level: Not on file  Occupational History   Occupation: security  Tobacco Use   Smoking status: Former    Types: Cigars    Quit date: 11/16/2016    Years since quitting: 6.5   Smokeless tobacco: Never   Tobacco comments:    6 cigars per wk  Vaping Use   Vaping status: Never Used  Substance and Sexual Activity   Alcohol use: Yes    Alcohol/week: 4.0 standard drinks of alcohol    Types: 2 Shots of liquor, 2 Cans of beer per week    Comment: ocasional   Drug use: No   Sexual activity: Not Currently  Other Topics Concern   Not on file  Social History Narrative   Not on file   Social Drivers of Health   Financial Resource Strain: Low Risk  (03/25/2020)   Overall Financial Resource Strain (CARDIA)    Difficulty of Paying Living Expenses: Not very hard  Food Insecurity: No Food Insecurity (03/25/2020)   Hunger Vital Sign  Worried About Programme researcher, broadcasting/film/video in the Last Year: Never true    Ran Out of Food in the Last Year: Never true  Transportation Needs: No Transportation Needs (03/25/2020)   PRAPARE - Administrator, Civil Service (Medical): No    Lack of Transportation (Non-Medical): No  Physical Activity: Not on file  Stress: Not on file  Social Connections: Not on file  Intimate Partner Violence: Not on file    Family History  Problem Relation Age of Onset   CAD Father    Vitals:   05/25/23 0914 05/25/23 0915  BP: (!) 104/0 92/78  Pulse: 77   SpO2: 99%   Weight: 116.6 kg (257 lb)     Wt Readings from Last 3 Encounters:  05/25/23 116.6 kg (257 lb)  03/20/23 116.8 kg (257 lb 6.4 oz)  01/08/23 117.5 kg (259 lb)      Vital Signs:                                                                 Doppler Pressure: 100 Automatc BP: 1118/77 (90)    HR: 77 SPO2: 97% on  RA                                                         Weight: 259.0 lb w/ eqt  Last weight: 259.4 lb w/ ept    Physical Exam: General:  NAD.  HEENT: normal  Neck: supple. JVP not elevated.  Carotids 2+ bilat; no bruits. No lymphadenopathy or thryomegaly appreciated. Cor: LVAD hum.  Lungs: Clear. Abdomen: obese soft, nontender, non-distended. No hepatosplenomegaly. No bruits or masses. Good bowel sounds. Driveline site clean. Anchor in place.  Extremities: no cyanosis, clubbing, rash. Warm no edema  Neuro: alert & oriented x 3. No focal deficits. Moves all 4 without problem    ASSESSMENT & PLAN:  1. Chronic Combined Systolic/Diastolic Heart Failure. NICM  - ECHO 4/19 EF 20% CMRI EF 22%  - s/p HM-3 LVAD on 05/15/17 - Doing well. NYHA I - Volume looks good  2. VAD management.  - VAD interrogated personally. Parameters stable. - Remains on ASA 81 mg daily + coumadin . - INR 2.1 INR goal 2.0-2.5 Discussed warfarin dosing with PharmD personally. 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 Off amio - Continue coumadin  - No bleeding  4. HTN - MAPs ok  5. DMII - Sugars remain elevated. CBG 360 today. Fortunately last HgbA1c down to 7.6  - Doubt he will tolerate SGLT2i with volume issues.  - Continue glipizide  10 bid. Unable to tolerate metformin  due to diarrhea - Referred to Endo but he did not go. Encouraged him to f/u and to check blood sugars routinely - Check A1c at next visit  6. CKD 3a - Creatinine baseline 1.5-1.8 - Scr stable t.2 today  7. Golf game - says he needs a new set of clubs for hs bday,   I spent a total of 41 minutes today: 1) reviewing the patient's medical records including previous charts,  labs and recent notes from other providers; 2) examining the patient and counseling them on their medical issues/explaining the plan of care; 3) adjusting meds as needed and 4) ordering lab work or other needed tests.    Jules Oar, MD 05/25/23

## 2023-06-01 ENCOUNTER — Ambulatory Visit (HOSPITAL_COMMUNITY)
Admission: RE | Admit: 2023-06-01 | Discharge: 2023-06-01 | Disposition: A | Discharge status: Home / Self Care | Source: Ambulatory Visit | Attending: Adult Health | Admitting: Adult Health

## 2023-06-01 DIAGNOSIS — Z95811 Presence of heart assist device: Secondary | ICD-10-CM | POA: Diagnosis present

## 2023-06-01 DIAGNOSIS — Z4509 Encounter for adjustment and management of other cardiac device: Secondary | ICD-10-CM | POA: Insufficient documentation

## 2023-06-01 NOTE — Progress Notes (Signed)
 Patient presents to clinic today for drive line exit wound care alone.   Exit Site Care: Existing VAD dressing removed and site care performed using sterile technique. Drive line exit site cleaned with Chlora prep applicators x 2, RINSED WITH SALINE, and allowed to dry. and Sorbaview dressing with Silverlon applied. Exit site healed and fully incorporated, the velour is fully implanted at exit site. No drainage, tenderness, or foul odor. Drive line anchor re-applied. Pt denies fever or chills.   Plan:  Return in 1 week for dressing change and INR.   Laurice Pope RN,BSN VAD Coordinator  Office: [phone]  24/7 Pager: [phone]

## 2023-06-25 ENCOUNTER — Other Ambulatory Visit (HOSPITAL_COMMUNITY): Payer: Self-pay | Admitting: *Deleted

## 2023-06-25 DIAGNOSIS — Z95811 Presence of heart assist device: Secondary | ICD-10-CM

## 2023-06-25 DIAGNOSIS — I5022 Chronic systolic (congestive) heart failure: Secondary | ICD-10-CM

## 2023-06-25 DIAGNOSIS — Z7901 Long term (current) use of anticoagulants: Secondary | ICD-10-CM

## 2023-06-26 ENCOUNTER — Ambulatory Visit (HOSPITAL_COMMUNITY)
Admission: RE | Admit: 2023-06-26 | Discharge: 2023-06-26 | Disposition: A | Discharge status: Home / Self Care | Source: Ambulatory Visit | Attending: Cardiology | Admitting: Cardiology

## 2023-06-26 ENCOUNTER — Ambulatory Visit (HOSPITAL_COMMUNITY): Payer: Self-pay | Admitting: Pharmacist

## 2023-06-26 DIAGNOSIS — Z95811 Presence of heart assist device: Secondary | ICD-10-CM | POA: Insufficient documentation

## 2023-06-26 DIAGNOSIS — I5022 Chronic systolic (congestive) heart failure: Secondary | ICD-10-CM | POA: Diagnosis not present

## 2023-06-26 DIAGNOSIS — Z4801 Encounter for change or removal of surgical wound dressing: Secondary | ICD-10-CM | POA: Diagnosis not present

## 2023-06-26 DIAGNOSIS — Z7901 Long term (current) use of anticoagulants: Secondary | ICD-10-CM | POA: Insufficient documentation

## 2023-06-26 LAB — PROTIME-INR
INR: 2.2 — ABNORMAL HIGH (ref 0.8–1.2)
Prothrombin Time: 24.6 s — ABNORMAL HIGH (ref 11.4–15.2)

## 2023-06-26 NOTE — Progress Notes (Signed)
 Patient presents to clinic today for INR and drive line exit wound care alone.   Exit Site Care: Existing VAD dressing removed and site care performed using sterile technique. Drive line exit site cleaned with Chlora prep applicators x 2, RINSED WITH SALINE, and allowed to dry. and Sorbaview dressing with Silverlon applied. Exit site healed and fully incorporated, the velour is fully implanted at exit site. No drainage, tenderness, or foul odor. Drive line anchor re-applied. Covered entire dressing with large tegaderm. Pt denies fever or chills.   Plan:  Return in 1 week for dressing change and INR.  Coumadin  dosing per Lauren PharmD  Paulo Bosworth RN VAD Coordinator  Office: [phone]  24/7 Pager: [phone]

## 2023-06-29 ENCOUNTER — Other Ambulatory Visit (HOSPITAL_COMMUNITY): Payer: Self-pay | Admitting: *Deleted

## 2023-06-29 DIAGNOSIS — Z95811 Presence of heart assist device: Secondary | ICD-10-CM

## 2023-06-29 DIAGNOSIS — Z7901 Long term (current) use of anticoagulants: Secondary | ICD-10-CM

## 2023-07-06 ENCOUNTER — Ambulatory Visit (HOSPITAL_COMMUNITY): Payer: Self-pay | Admitting: Pharmacist

## 2023-07-06 ENCOUNTER — Ambulatory Visit (HOSPITAL_COMMUNITY)
Admission: RE | Admit: 2023-07-06 | Discharge: 2023-07-06 | Disposition: A | Discharge status: Home / Self Care | Source: Ambulatory Visit | Attending: Cardiology | Admitting: Cardiology

## 2023-07-06 DIAGNOSIS — Z95811 Presence of heart assist device: Secondary | ICD-10-CM

## 2023-07-06 DIAGNOSIS — Z5181 Encounter for therapeutic drug level monitoring: Secondary | ICD-10-CM | POA: Diagnosis not present

## 2023-07-06 DIAGNOSIS — Z4509 Encounter for adjustment and management of other cardiac device: Secondary | ICD-10-CM | POA: Insufficient documentation

## 2023-07-06 DIAGNOSIS — Z7901 Long term (current) use of anticoagulants: Secondary | ICD-10-CM | POA: Insufficient documentation

## 2023-07-06 LAB — PROTIME-INR
INR: 2 — ABNORMAL HIGH (ref 0.8–1.2)
Prothrombin Time: 23.3 s — ABNORMAL HIGH (ref 11.4–15.2)

## 2023-07-06 NOTE — Addendum Note (Signed)
 Encounter addended by: Kelbi Renstrom M, CMA on: 07/06/2023 9:49 AM  Actions taken: Child order released for a procedure order

## 2023-07-06 NOTE — Progress Notes (Signed)
 Patient presents to clinic today for INR and drive line exit wound care alone.   Exit Site Care: Existing VAD dressing removed and site care performed using sterile technique. Drive line exit site cleaned with Chlora prep applicators x 2, RINSED WITH SALINE, and allowed to dry. and Sorbaview dressing with Silverlon applied. Exit site healed and fully incorporated, the velour is fully implanted at exit site. No drainage, tenderness, or foul odor. Drive line anchor re-applied. Covered entire dressing with large tegaderm. Pt denies fever or chills. Provided with 5 anchors for home use.   Plan:  Return in 1 week for dressing change and INR.  Coumadin  dosing per Lauren PharmD  Paulo Bosworth RN VAD Coordinator  Office: [phone]  24/7 Pager: [phone]

## 2023-07-13 ENCOUNTER — Encounter (HOSPITAL_COMMUNITY)

## 2023-07-16 ENCOUNTER — Other Ambulatory Visit (HOSPITAL_COMMUNITY): Payer: Self-pay

## 2023-07-16 DIAGNOSIS — Z7901 Long term (current) use of anticoagulants: Secondary | ICD-10-CM

## 2023-07-16 DIAGNOSIS — Z95811 Presence of heart assist device: Secondary | ICD-10-CM

## 2023-07-18 ENCOUNTER — Ambulatory Visit (HOSPITAL_COMMUNITY)
Admission: RE | Admit: 2023-07-18 | Discharge: 2023-07-18 | Disposition: A | Discharge status: Home / Self Care | Source: Ambulatory Visit | Attending: Cardiology | Admitting: Cardiology

## 2023-07-18 ENCOUNTER — Ambulatory Visit (HOSPITAL_COMMUNITY): Payer: Self-pay | Admitting: Pharmacist

## 2023-07-18 DIAGNOSIS — Z7901 Long term (current) use of anticoagulants: Secondary | ICD-10-CM | POA: Insufficient documentation

## 2023-07-18 DIAGNOSIS — Z4509 Encounter for adjustment and management of other cardiac device: Secondary | ICD-10-CM | POA: Insufficient documentation

## 2023-07-18 DIAGNOSIS — Z95811 Presence of heart assist device: Secondary | ICD-10-CM | POA: Insufficient documentation

## 2023-07-18 LAB — PROTIME-INR
INR: 2 — ABNORMAL HIGH (ref 0.8–1.2)
Prothrombin Time: 23.9 s — ABNORMAL HIGH (ref 11.4–15.2)

## 2023-07-18 NOTE — Progress Notes (Signed)
 Patient presents to clinic today for INR and drive line exit wound care alone.   Exit Site Care: Existing VAD dressing removed and site care performed using sterile technique. Drive line exit site cleaned with Chlora prep applicators x 2, RINSED WITH SALINE, and allowed to dry. and Sorbaview dressing with Silverlon applied. Exit site healed and fully incorporated, the velour is fully implanted at exit site. No drainage, tenderness, or foul odor. Drive line anchor re-applied. Covered entire dressing with large tegaderm. Pt denies fever or chills. Provided with 5 anchors for home use.   Plan:  Return in 1 week for dressing change and INR.  Coumadin  dosing per Lauren PharmD  Lauraine Ip RN VAD Coordinator  Office: [phone]  24/7 Pager: [phone]

## 2023-07-27 ENCOUNTER — Encounter (HOSPITAL_COMMUNITY)

## 2023-08-01 ENCOUNTER — Other Ambulatory Visit: Payer: Self-pay | Admitting: *Deleted

## 2023-08-01 ENCOUNTER — Other Ambulatory Visit (HOSPITAL_COMMUNITY): Payer: Self-pay | Admitting: *Deleted

## 2023-08-01 DIAGNOSIS — Z95811 Presence of heart assist device: Secondary | ICD-10-CM

## 2023-08-01 DIAGNOSIS — Z7901 Long term (current) use of anticoagulants: Secondary | ICD-10-CM

## 2023-08-01 DIAGNOSIS — I5022 Chronic systolic (congestive) heart failure: Secondary | ICD-10-CM

## 2023-08-03 ENCOUNTER — Other Ambulatory Visit (HOSPITAL_COMMUNITY): Payer: Self-pay | Admitting: *Deleted

## 2023-08-03 ENCOUNTER — Ambulatory Visit (HOSPITAL_COMMUNITY): Payer: Self-pay | Admitting: Pharmacist

## 2023-08-03 ENCOUNTER — Ambulatory Visit (HOSPITAL_COMMUNITY)
Admission: RE | Admit: 2023-08-03 | Discharge: 2023-08-03 | Disposition: A | Discharge status: Home / Self Care | Source: Ambulatory Visit | Attending: Internal Medicine | Admitting: Internal Medicine

## 2023-08-03 VITALS — BP 91/78 | HR 76 | Ht 72.0 in | Wt 251.2 lb

## 2023-08-03 DIAGNOSIS — I5022 Chronic systolic (congestive) heart failure: Secondary | ICD-10-CM

## 2023-08-03 DIAGNOSIS — Z95811 Presence of heart assist device: Secondary | ICD-10-CM | POA: Diagnosis not present

## 2023-08-03 DIAGNOSIS — E1169 Type 2 diabetes mellitus with other specified complication: Secondary | ICD-10-CM

## 2023-08-03 DIAGNOSIS — Z7901 Long term (current) use of anticoagulants: Secondary | ICD-10-CM

## 2023-08-03 DIAGNOSIS — Z4801 Encounter for change or removal of surgical wound dressing: Secondary | ICD-10-CM | POA: Insufficient documentation

## 2023-08-03 DIAGNOSIS — I480 Paroxysmal atrial fibrillation: Secondary | ICD-10-CM

## 2023-08-03 DIAGNOSIS — I10 Essential (primary) hypertension: Secondary | ICD-10-CM

## 2023-08-03 DIAGNOSIS — N1831 Chronic kidney disease, stage 3a: Secondary | ICD-10-CM

## 2023-08-03 LAB — CBC
HCT: 43.3 % (ref 39.0–52.0)
Hemoglobin: 14.2 g/dL (ref 13.0–17.0)
MCH: 27.9 pg (ref 26.0–34.0)
MCHC: 32.8 g/dL (ref 30.0–36.0)
MCV: 85.1 fL (ref 80.0–100.0)
Platelets: 237 K/uL (ref 150–400)
RBC: 5.09 MIL/uL (ref 4.22–5.81)
RDW: 15.5 % (ref 11.5–15.5)
WBC: 7.4 K/uL (ref 4.0–10.5)
nRBC: 0 % (ref 0.0–0.2)

## 2023-08-03 LAB — PROTIME-INR
INR: 2 — ABNORMAL HIGH (ref 0.8–1.2)
Prothrombin Time: 24.1 s — ABNORMAL HIGH (ref 11.4–15.2)

## 2023-08-03 NOTE — Progress Notes (Signed)
 Patient presents for 2 month follow up in VAD Clinic today alone. Reports no problems with VAD equipment or concerns with drive line.   Pt states he has not been playing golf lately due to extreme heat. He denies lightheadedness, dizziness, falls, heart failure symptoms, and signs of bleeding.  Pt with weigh loss. States he has been trying. Working out on treadmill 30 minutes/daily and has cut down on red meat.  Reports one battery when placed in charger was red. He reports it charged and is wearing today. Checked during clinic visit and both his batteries are due for re-calibration. Demonstrated how to re-calibrate and reminded him he can only do one at at time.  Advised him if battery shows red when placed in battery slot, remove and replace in slot. If it continues to show red indicator light, he should remove from use and bring to next clinic visit. Pt verbalized understanding of same.    Vital Signs:   Ht: 6'      Doppler Pressure: 80 Automatc BP: 91/78 (84)  HR: 76 SPO2: 97% on RA        Weight: 251.2 lb w/ eqt  Last weight: 257 lb w/ ept   VAD Indication: Destination Therapy - due to limited social support.    LVAD assessment:      Speed: 5300 RPM      Flow: 4.6      Power: 3.9 w       PI: 4.0  Alarms: few LV Events:  30 - 40 PI events daily  Hct: 20       Fixed speed: 5300 Low speed limit: 5000  Primary Controller:  Replace back up battery in 15 months.  Back up controller: Replace back up battery in 23 months.  I reviewed the LVAD parameters from today and compared the results to the patient's prior recorded data. LVAD interrogation was STABLE for significant power changes, NEGATIVE for clinical alarms and STABLE for PI events/speed drops. No programming changes were made and pump is functioning within specified parameters.    LVAD equipment check completed and is in good working order. Back-up equipment present.   Annual maintenance completed on patient's home  equipment per [facility] 03/20/23.  Exit Site Care:  Existing VAD dressing removed and site care performed using sterile technique. Drive line exit site cleaned with Chlora prep applicators x 2, allowed to dry, and silverlon patch w/ Sorbaview dressing applied. Exit site healed and incorporated, the velour is fully implanted at exit site. No drainage, redness, tenderness, foul odor noted. Rash noted under previous anchor site. Drive line anchor repositioned and re-applied- will trial cath grip anchor this week. Pt denies fever or chills. Pt has adequate dressing supplies at home.   Device:  N/A   BP & Labs:  Doppler 84 - reflecting MASNo change in medications. Return next week for dressing change - see below. Return to see Dr. Cherrie in 2 months - see below.P   Hgb pending -  No S/S of bleeding. Specifically denies melena/BRBPR or nosebleeds.   LDH pending - established baseline of 200 - 400. Denies tea-colored urine. No power elevations noted on interrogation other than above noted incidents.    Patient Instructions:  No change in medications. Return next week for dressing change - see below. Return to see Dr. Cherrie in 2 months - see below.  Geofm Christmas RN VAD Coordinator  Office: [phone]  24/7 Pager: [phone]

## 2023-08-03 NOTE — Patient Instructions (Signed)
 No change in medications. Return next week for dressing change - see below. Return to see Dr. Cherrie in 2 months - see below.

## 2023-08-07 ENCOUNTER — Other Ambulatory Visit (HOSPITAL_COMMUNITY): Payer: Self-pay | Admitting: *Deleted

## 2023-08-07 DIAGNOSIS — Z7901 Long term (current) use of anticoagulants: Secondary | ICD-10-CM

## 2023-08-07 DIAGNOSIS — Z95811 Presence of heart assist device: Secondary | ICD-10-CM

## 2023-08-07 DIAGNOSIS — I5022 Chronic systolic (congestive) heart failure: Secondary | ICD-10-CM

## 2023-08-10 ENCOUNTER — Other Ambulatory Visit (HOSPITAL_COMMUNITY)

## 2023-08-12 NOTE — Progress Notes (Addendum)
 LVAD Clinic Note   Primary Cardiologist: Dr. Cherrie   HPI: Benjamin Sherman is a 53 y.o. 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 ECHO showed severely reduced EF. 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 Placed on steroids without benefit.  Admitted 11/18 after fall/syncopal episode resulting in bifrontal SAH. Neurosurgery consulted.   Admitted 4/19 with cardiogenic shock in setting of AFL. Supported with inotropes and underwent DC-CV but had persistent shock. After numerous discussions about concern for adequate social support underwent placement of HM-3 LVAD on 05/15/17. D/c home 06/04/17  Here for routine f/u. Continues to do very well with VAD support. Not playing golf due to the heat. Walking on TM for 51mins/day. Denies DOE, edema,orthopnea or PND. No fevers, chills or problems with driveline. No bleeding, melena or neuro symptoms. No VAD alarms. Taking all meds as prescribed. Has been cutting down on red meat. Trying to lose weight.   VAD Indication: Destination Therapy - due to limited social support.    LVAD assessment:                                                    Speed: 5300 RPM                                                       Flow: 4.6                                                      Power: 3.9 w                               PI: 4.0   Alarms: few LV Events:  30 - 40 PI events daily  Hct: 20                                                              Fixed speed: 5300 Low speed limit: 5000   Primary Controller:  Replace back up battery in 15 months.  Back up controller: Replace back up battery in 23 months.   I reviewed the LVAD parameters from today and compared the results to the patient's prior recorded data. LVAD interrogation was STABLE for significant power changes, NEGATIVE  for clinical alarms and STABLE for PI events/speed drops. No programming changes were made and pump is functioning within specified parameters.       Past Medical History:  Diagnosis Date   CHF (congestive heart failure) (HCC)    Diabetes mellitus without complication (HCC)     Current Outpatient Medications  Medication Sig Dispense Refill   amLODipine  (NORVASC ) 5 MG  tablet Take 2 tablets (10 mg total) by mouth daily. 180 tablet 3   aspirin  81 MG EC tablet Take 1 tablet (81 mg total) by mouth daily. 90 tablet 3   Blood Glucose Monitoring Suppl (TRUE METRIX METER) DEVI 1 each by Does not apply route daily with breakfast. 1 Device 0   glipiZIDE  (GLUCOTROL ) 10 MG tablet TAKE (1) TABLET TWICE DAILY, BEFORE MEALS. 180 tablet 3   glucose blood (TRUE METRIX BLOOD GLUCOSE TEST) test strip Use daily before breakfast 30 each 12   nystatin  cream (MYCOSTATIN ) Apply 1 Application topically 2 (two) times daily. 30 g 0   pantoprazole  (PROTONIX ) 40 MG tablet Take 1 tablet (40 mg total) by mouth daily. 90 tablet 3   sacubitril -valsartan  (ENTRESTO ) 49-51 MG TAKE (1) TABLET BY MOUTH TWICE DAILY. 180 tablet 3   TRUEPLUS LANCETS 28G MISC 1 each by Does not apply route daily. 30 each 12   warfarin (COUMADIN ) 6 MG tablet 3 mg (6 mg x 0.5) every Tue, Thu; 6 mg (6 mg x 1) all other days 90 tablet 3   No current facility-administered medications for this encounter.   Allergies  Allergen Reactions   Bee Venom     UNSPECIFIED REACTION    Social History   Socioeconomic History   Marital status: Single    Spouse name: Not on file   Number of children: Not on file   Years of education: Not on file   Highest education level: Not on file  Occupational History   Occupation: security  Tobacco Use   Smoking status: Former    Types: Cigars    Quit date: 11/16/2016    Years since quitting: 6.7   Smokeless tobacco: Never   Tobacco comments:    6 cigars per wk  Vaping Use   Vaping status: Never Used   Substance and Sexual Activity   Alcohol use: Yes    Alcohol/week: 4.0 standard drinks of alcohol    Types: 2 Shots of liquor, 2 Cans of beer per week    Comment: ocasional   Drug use: No   Sexual activity: Not Currently  Other Topics Concern   Not on file  Social History Narrative   Not on file   Social Drivers of Health   Financial Resource Strain: Low Risk  (03/25/2020)   Overall Financial Resource Strain (CARDIA)    Difficulty of Paying Living Expenses: Not very hard  Food Insecurity: No Food Insecurity (03/25/2020)   Hunger Vital Sign    Worried About Running Out of Food in the Last Year: Never true    Ran Out of Food in the Last Year: Never true  Transportation Needs: No Transportation Needs (03/25/2020)   PRAPARE - Administrator, Civil Service (Medical): No    Lack of Transportation (Non-Medical): No  Physical Activity: Not on file  Stress: Not on file  Social Connections: Not on file  Intimate Partner Violence: Not on file    Family History  Problem Relation Age of Onset   CAD Father    Vitals:   08/03/23 0907 08/03/23 0926 08/03/23 0927  BP:  (!) 80/0 91/78  Pulse:   76  SpO2:   97%  Weight: 113.9 kg (251 lb 3.2 oz)  113.9 kg (251 lb 3.2 oz)  Height:   6' (1.829 m)    Wt Readings from Last 3 Encounters:  08/03/23 113.9 kg (251 lb 3.2 oz)  05/25/23 116.6 kg (257 lb)  03/20/23 116.8 kg (  257 lb 6.4 oz)       Vital Signs:     Ht: 6'                                                     Doppler Pressure: 80 Automatc BP: 91/78 (84)    HR: 76 SPO2: 97% on RA                                                         Weight: 251.2 lb w/ eqt  Last weight: 257 lb w/ ept   Physical Exam: General:  NAD.  HEENT: normal  Neck: supple. JVP not elevated.  Carotids 2+ bilat; no bruits. No lymphadenopathy or thryomegaly appreciated. Cor: LVAD hum.  Lungs: Clear. Abdomen: obese soft, nontender, non-distended. No hepatosplenomegaly. No bruits or masses.  Good bowel sounds. Driveline site clean. Anchor in place.  Extremities: no cyanosis, clubbing, rash. Warm no edema  Neuro: alert & oriented x 3. No focal deficits. Moves all 4 without problem    ASSESSMENT & PLAN:  1. Chronic Combined Systolic/Diastolic Heart Failure. NICM  - ECHO 4/19 EF 20% CMRI EF 22%  - s/p HM-3 LVAD on 05/15/17 - Stable NYHA I - Volume looks good - Remains uninterested in transplant eval  2. VAD management.  - VAD interrogated personally. Parameters stable. - Remains on ASA 81 mg daily + coumadin . - INR 2.0 INR goal 2.0-2.5 Discussed warfarin dosing with PharmD personally. - LDH not drawn today - DL site looks ok - MAPs ok  3. 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 - remains in NSR  4. HTN -   5. DMII - Sugars remain elevated. Fortunately last HgbA1c down to 7.6  - Doubt he will tolerate SGLT2i with volume issues.  - Continue glipizide  10 bid. Unable to tolerate metformin  due to diarrhea - Referred to Endo but has not gone in past - Continue to follow  6. CKD 3a - Creatinine baseline 1.5-1.8 - Scr not drwan today  7. Golf game - says he needs a new set of clubs for hs bday,   I spent a total of 41 minutes today: 1) reviewing the patient's medical records including previous charts, labs and recent notes from other providers; 2) examining the patient and counseling them on their medical issues/explaining the plan of care; 3) adjusting meds as needed and 4) ordering lab work or other needed tests.   Toribio Fuel, MD 08/12/23

## 2023-08-12 NOTE — Addendum Note (Signed)
 Encounter addended by: Aiden Helzer R, MD on: 08/12/2023 1:23 PM  Actions taken: Clinical Note Signed

## 2023-08-17 ENCOUNTER — Other Ambulatory Visit (HOSPITAL_COMMUNITY): Payer: Self-pay

## 2023-08-17 ENCOUNTER — Other Ambulatory Visit (HOSPITAL_COMMUNITY)

## 2023-08-17 DIAGNOSIS — Z7901 Long term (current) use of anticoagulants: Secondary | ICD-10-CM

## 2023-08-17 DIAGNOSIS — Z95811 Presence of heart assist device: Secondary | ICD-10-CM

## 2023-08-20 ENCOUNTER — Ambulatory Visit (HOSPITAL_COMMUNITY): Payer: Self-pay | Admitting: Pharmacist

## 2023-08-20 ENCOUNTER — Ambulatory Visit (HOSPITAL_COMMUNITY)
Admission: RE | Admit: 2023-08-20 | Discharge: 2023-08-20 | Disposition: A | Discharge status: Home / Self Care | Source: Ambulatory Visit | Attending: Cardiology | Admitting: Cardiology

## 2023-08-20 DIAGNOSIS — Z7901 Long term (current) use of anticoagulants: Secondary | ICD-10-CM | POA: Insufficient documentation

## 2023-08-20 DIAGNOSIS — Z95811 Presence of heart assist device: Secondary | ICD-10-CM | POA: Diagnosis not present

## 2023-08-20 DIAGNOSIS — Z4801 Encounter for change or removal of surgical wound dressing: Secondary | ICD-10-CM | POA: Insufficient documentation

## 2023-08-20 LAB — PROTIME-INR
INR: 2.5 — ABNORMAL HIGH (ref 0.8–1.2)
Prothrombin Time: 27.8 s — ABNORMAL HIGH (ref 11.4–15.2)

## 2023-08-20 NOTE — Progress Notes (Signed)
 Patient presents to clinic today for INR and drive line exit wound care alone.   Exit Site Care: Existing VAD dressing removed and site care performed using sterile technique. Drive line exit site cleaned with Chlora prep applicators x 2, RINSED WITH SALINE, and allowed to dry. and Sorbaview dressing with Silverlon applied. Exit site healed and fully incorporated, the velour is fully implanted at exit site. No drainage, tenderness, or foul odor. Mild rash noted underneath anchor site. Cleansed with VASHE and drive line anchor re-applied overtop a small tegaderm to prevent irritation. Covered entire dressing with large tegaderm. Pt denies fever or chills. Provided with 4 weekly kits and 7 anchors asked to bring a kit to each appointment for drive line care.  Plan:  Return in 1 week for dressing change and INR.  Coumadin  dosing per Lauren PharmD  Schuyler Lunger RN, BSN VAD Coordinator 24/7 Pager [phone]

## 2023-08-31 ENCOUNTER — Other Ambulatory Visit (HOSPITAL_COMMUNITY): Payer: Self-pay | Admitting: *Deleted

## 2023-08-31 ENCOUNTER — Ambulatory Visit (HOSPITAL_COMMUNITY)
Admission: RE | Admit: 2023-08-31 | Discharge: 2023-08-31 | Disposition: A | Discharge status: Home / Self Care | Source: Ambulatory Visit | Attending: Cardiology | Admitting: Cardiology

## 2023-08-31 DIAGNOSIS — Z7901 Long term (current) use of anticoagulants: Secondary | ICD-10-CM

## 2023-08-31 DIAGNOSIS — Z4509 Encounter for adjustment and management of other cardiac device: Secondary | ICD-10-CM | POA: Diagnosis present

## 2023-08-31 DIAGNOSIS — Z95811 Presence of heart assist device: Secondary | ICD-10-CM

## 2023-08-31 NOTE — Progress Notes (Signed)
 Patient presents to clinic today for drive line exit wound care alone.   Exit Site Care: Existing VAD dressing removed and site care performed using sterile technique. Drive line exit site cleaned with Chlora prep applicators x 2, RINSED WITH SALINE, and allowed to dry. and Sorbaview dressing with Silverlon applied. Exit site healed and fully incorporated, the velour is fully implanted at exit site. No drainage, tenderness, or foul odor. Mild rash resolved under previous anchor site. Covered entire dressing with large tegaderm. Pt denies fever or chills. Pt has adequate dressing supplies at home.   Plan:  Return in 1 week for dressing change and INR.    Isaiah Knoll RN VAD Coordinator  Office: [phone]  24/7 Pager: [phone]

## 2023-09-07 ENCOUNTER — Other Ambulatory Visit (HOSPITAL_COMMUNITY)

## 2023-09-14 ENCOUNTER — Ambulatory Visit (HOSPITAL_COMMUNITY)
Admission: RE | Admit: 2023-09-14 | Discharge: 2023-09-14 | Disposition: A | Discharge status: Home / Self Care | Source: Ambulatory Visit | Attending: Cardiology | Admitting: Cardiology

## 2023-09-14 DIAGNOSIS — Z95811 Presence of heart assist device: Secondary | ICD-10-CM | POA: Diagnosis not present

## 2023-09-14 DIAGNOSIS — Z7901 Long term (current) use of anticoagulants: Secondary | ICD-10-CM

## 2023-09-14 DIAGNOSIS — Z4801 Encounter for change or removal of surgical wound dressing: Secondary | ICD-10-CM | POA: Insufficient documentation

## 2023-09-14 NOTE — Progress Notes (Signed)
 Patient presents to clinic today for drive line exit wound care alone.   Exit Site Care: Existing VAD dressing removed and site care performed using sterile technique. Drive line exit site cleaned with Chlora prep applicators x 2, RINSED WITH SALINE, and allowed to dry. and Sorbaview dressing with Silverlon applied. Exit site healed and fully incorporated, the velour is fully implanted at exit site. No drainage, tenderness, or foul odor. Mild rash resolved under previous anchor site. Dressing positioned and anchor so that this area is OTA. Pt denies fever or chills. Pt has adequate dressing supplies at home.   Plan:  Return in 1 week for dressing change and INR.    Lauraine Ip RN VAD Coordinator  Office: [phone]  24/7 Pager: [phone]

## 2023-09-21 ENCOUNTER — Other Ambulatory Visit (HOSPITAL_COMMUNITY)

## 2023-10-01 ENCOUNTER — Other Ambulatory Visit (HOSPITAL_COMMUNITY): Payer: Self-pay | Admitting: *Deleted

## 2023-10-01 ENCOUNTER — Ambulatory Visit (HOSPITAL_COMMUNITY): Payer: Self-pay | Admitting: Pharmacist

## 2023-10-01 ENCOUNTER — Ambulatory Visit (HOSPITAL_COMMUNITY)
Admission: RE | Admit: 2023-10-01 | Discharge: 2023-10-01 | Disposition: A | Discharge status: Home / Self Care | Source: Ambulatory Visit | Attending: Internal Medicine

## 2023-10-01 DIAGNOSIS — Z95811 Presence of heart assist device: Secondary | ICD-10-CM | POA: Diagnosis not present

## 2023-10-01 DIAGNOSIS — Z7901 Long term (current) use of anticoagulants: Secondary | ICD-10-CM

## 2023-10-01 DIAGNOSIS — Z4509 Encounter for adjustment and management of other cardiac device: Secondary | ICD-10-CM | POA: Diagnosis present

## 2023-10-01 LAB — PROTIME-INR
INR: 2.7 — ABNORMAL HIGH (ref 0.8–1.2)
Prothrombin Time: 30.1 s — ABNORMAL HIGH (ref 11.4–15.2)

## 2023-10-01 NOTE — Progress Notes (Signed)
 Patient presents to clinic today for INR & drive line exit wound care alone.   Exit Site Care: Existing VAD dressing removed and site care performed using sterile technique. Drive line exit site cleaned with Chlora prep applicators x 2, RINSED WITH SALINE, and allowed to dry. and Sorbaview dressing with Silverlon applied. Exit site healed and fully incorporated, the velour is fully implanted at exit site. No drainage, tenderness, or foul odor. Mild rash resolved under previous anchor site. Pt denies fever or chills. Pt has adequate dressing supplies at home.   Plan:  Return in 1 week for dressing change  Coumadin  by Lauren PharmD   Benjamin Knoll RN VAD Coordinator  Office: [phone]  24/7 Pager: [phone]

## 2023-10-01 NOTE — Addendum Note (Signed)
 Encounter addended by: Berdine Isaiah NOVAK, RN on: 10/01/2023 8:55 AM  Actions taken: Charge Capture section accepted

## 2023-10-05 ENCOUNTER — Encounter (HOSPITAL_COMMUNITY): Admitting: Internal Medicine

## 2023-10-12 ENCOUNTER — Ambulatory Visit (HOSPITAL_COMMUNITY)
Admission: RE | Admit: 2023-10-12 | Discharge: 2023-10-12 | Disposition: A | Discharge status: Home / Self Care | Source: Ambulatory Visit | Attending: Cardiology | Admitting: Cardiology

## 2023-10-12 DIAGNOSIS — Z4801 Encounter for change or removal of surgical wound dressing: Secondary | ICD-10-CM | POA: Diagnosis present

## 2023-10-12 DIAGNOSIS — Z95811 Presence of heart assist device: Secondary | ICD-10-CM | POA: Insufficient documentation

## 2023-10-12 NOTE — Progress Notes (Signed)
 Patient presents to clinic today for drive line exit wound care alone.   Exit Site Care: Existing VAD dressing removed and site care performed using sterile technique. Drive line exit site cleaned with Chlora prep applicators x 2, RINSED WITH SALINE, and allowed to dry. and Sorbaview dressing with Silverlon applied. Exit site healed and fully incorporated, the velour is fully implanted at exit site. No drainage, tenderness, or foul odor. Mild rash resolved under previous anchor site. Pt denies fever or chills. Pt has adequate dressing supplies at home.   Plan:  Return in 1 week for a full visit  Coumadin  by Lauren PharmD   Lauraine Ip RN VAD Coordinator  Office: [phone]  24/7 Pager: [phone]

## 2023-10-17 ENCOUNTER — Other Ambulatory Visit (HOSPITAL_COMMUNITY): Payer: Self-pay | Admitting: *Deleted

## 2023-10-17 ENCOUNTER — Encounter (HOSPITAL_COMMUNITY): Payer: Self-pay | Admitting: *Deleted

## 2023-10-17 DIAGNOSIS — Z7901 Long term (current) use of anticoagulants: Secondary | ICD-10-CM

## 2023-10-17 DIAGNOSIS — Z95811 Presence of heart assist device: Secondary | ICD-10-CM

## 2023-10-17 DIAGNOSIS — I5022 Chronic systolic (congestive) heart failure: Secondary | ICD-10-CM

## 2023-10-17 NOTE — Addendum Note (Signed)
 Addended by: BERDINE RAKE B on: 10/17/2023 03:30 PM   Modules accepted: Orders

## 2023-10-19 ENCOUNTER — Encounter (HOSPITAL_COMMUNITY): Admitting: Cardiology

## 2023-10-24 ENCOUNTER — Other Ambulatory Visit (HOSPITAL_COMMUNITY): Payer: Self-pay

## 2023-10-24 DIAGNOSIS — Z7901 Long term (current) use of anticoagulants: Secondary | ICD-10-CM

## 2023-10-24 DIAGNOSIS — Z95811 Presence of heart assist device: Secondary | ICD-10-CM

## 2023-10-26 ENCOUNTER — Ambulatory Visit (HOSPITAL_COMMUNITY)
Admission: RE | Admit: 2023-10-26 | Discharge: 2023-10-26 | Disposition: A | Discharge status: Home / Self Care | Source: Ambulatory Visit | Attending: Cardiology | Admitting: Cardiology

## 2023-10-26 ENCOUNTER — Ambulatory Visit (HOSPITAL_COMMUNITY): Payer: Self-pay | Admitting: Pharmacist

## 2023-10-26 DIAGNOSIS — Z4509 Encounter for adjustment and management of other cardiac device: Secondary | ICD-10-CM | POA: Diagnosis present

## 2023-10-26 DIAGNOSIS — Z7901 Long term (current) use of anticoagulants: Secondary | ICD-10-CM | POA: Diagnosis not present

## 2023-10-26 DIAGNOSIS — Z95811 Presence of heart assist device: Secondary | ICD-10-CM

## 2023-10-26 LAB — PROTIME-INR
INR: 2.2 — ABNORMAL HIGH (ref 0.8–1.2)
Prothrombin Time: 25.3 s — ABNORMAL HIGH (ref 11.4–15.2)

## 2023-10-26 NOTE — Progress Notes (Signed)
 Patient presents to clinic today for drive line exit wound care/INR alone.   Exit Site Care: Existing VAD dressing removed and site care performed using sterile technique. Drive line exit site cleaned with Chlora prep applicators x 2, RINSED WITH SALINE, and allowed to dry. and Sorbaview dressing with Silverlon applied. Exit site healed and fully incorporated, the velour is fully implanted at exit site. No drainage, tenderness, or foul odor. Pt denies fever or chills. Pt has adequate dressing supplies at home.   Plan:  Return in 1 week for a full visit  Coumadin  by Nason PharmD   Lauraine Ip RN VAD Coordinator  Office: [phone]  24/7 Pager: [phone]

## 2023-11-02 ENCOUNTER — Other Ambulatory Visit (HOSPITAL_COMMUNITY): Payer: Self-pay | Admitting: *Deleted

## 2023-11-02 ENCOUNTER — Ambulatory Visit (HOSPITAL_COMMUNITY): Payer: Self-pay | Admitting: Pharmacist

## 2023-11-02 ENCOUNTER — Ambulatory Visit (HOSPITAL_COMMUNITY)
Admission: RE | Admit: 2023-11-02 | Discharge: 2023-11-02 | Disposition: A | Source: Ambulatory Visit | Attending: Internal Medicine | Admitting: Internal Medicine

## 2023-11-02 VITALS — BP 114/88 | HR 85 | Wt 254.2 lb

## 2023-11-02 DIAGNOSIS — I130 Hypertensive heart and chronic kidney disease with heart failure and stage 1 through stage 4 chronic kidney disease, or unspecified chronic kidney disease: Secondary | ICD-10-CM | POA: Diagnosis present

## 2023-11-02 DIAGNOSIS — I10 Essential (primary) hypertension: Secondary | ICD-10-CM

## 2023-11-02 DIAGNOSIS — N1831 Chronic kidney disease, stage 3a: Secondary | ICD-10-CM

## 2023-11-02 DIAGNOSIS — Z4509 Encounter for adjustment and management of other cardiac device: Secondary | ICD-10-CM | POA: Insufficient documentation

## 2023-11-02 DIAGNOSIS — E119 Type 2 diabetes mellitus without complications: Secondary | ICD-10-CM | POA: Diagnosis not present

## 2023-11-02 DIAGNOSIS — I5022 Chronic systolic (congestive) heart failure: Secondary | ICD-10-CM | POA: Diagnosis not present

## 2023-11-02 DIAGNOSIS — I428 Other cardiomyopathies: Secondary | ICD-10-CM | POA: Diagnosis not present

## 2023-11-02 DIAGNOSIS — I513 Intracardiac thrombosis, not elsewhere classified: Secondary | ICD-10-CM | POA: Insufficient documentation

## 2023-11-02 DIAGNOSIS — I4892 Unspecified atrial flutter: Secondary | ICD-10-CM | POA: Diagnosis not present

## 2023-11-02 DIAGNOSIS — Z7984 Long term (current) use of oral hypoglycemic drugs: Secondary | ICD-10-CM | POA: Diagnosis not present

## 2023-11-02 DIAGNOSIS — Z7982 Long term (current) use of aspirin: Secondary | ICD-10-CM | POA: Diagnosis not present

## 2023-11-02 DIAGNOSIS — E039 Hypothyroidism, unspecified: Secondary | ICD-10-CM | POA: Diagnosis not present

## 2023-11-02 DIAGNOSIS — Z7901 Long term (current) use of anticoagulants: Secondary | ICD-10-CM

## 2023-11-02 DIAGNOSIS — E1122 Type 2 diabetes mellitus with diabetic chronic kidney disease: Secondary | ICD-10-CM | POA: Insufficient documentation

## 2023-11-02 DIAGNOSIS — I5042 Chronic combined systolic (congestive) and diastolic (congestive) heart failure: Secondary | ICD-10-CM | POA: Insufficient documentation

## 2023-11-02 DIAGNOSIS — Z8249 Family history of ischemic heart disease and other diseases of the circulatory system: Secondary | ICD-10-CM | POA: Insufficient documentation

## 2023-11-02 DIAGNOSIS — Z95811 Presence of heart assist device: Secondary | ICD-10-CM

## 2023-11-02 DIAGNOSIS — Z87891 Personal history of nicotine dependence: Secondary | ICD-10-CM | POA: Insufficient documentation

## 2023-11-02 LAB — FERRITIN: Ferritin: 338 ng/mL — ABNORMAL HIGH (ref 24–336)

## 2023-11-02 LAB — CBC
HCT: 41 % (ref 39.0–52.0)
Hemoglobin: 14.1 g/dL (ref 13.0–17.0)
MCH: 28.8 pg (ref 26.0–34.0)
MCHC: 34.4 g/dL (ref 30.0–36.0)
MCV: 83.7 fL (ref 80.0–100.0)
Platelets: 234 K/uL (ref 150–400)
RBC: 4.9 MIL/uL (ref 4.22–5.81)
RDW: 14.9 % (ref 11.5–15.5)
WBC: 6.2 K/uL (ref 4.0–10.5)
nRBC: 0 % (ref 0.0–0.2)

## 2023-11-02 LAB — IRON AND TIBC
Iron: 80 ug/dL (ref 45–182)
Saturation Ratios: 36 % (ref 17.9–39.5)
TIBC: 266 ug/dL (ref 250–450)
UIBC: 170 ug/dL

## 2023-11-02 LAB — PROTIME-INR
INR: 2.4 — ABNORMAL HIGH (ref 0.8–1.2)
Prothrombin Time: 27.7 s — ABNORMAL HIGH (ref 11.4–15.2)

## 2023-11-02 NOTE — Progress Notes (Signed)
 LVAD Clinic Note   Primary Cardiologist: Dr. Cherrie   HPI: Benjamin Sherman is a 53 y.o. 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 ECHO showed severely reduced EF. 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 Placed on steroids without benefit.  Admitted 11/18 after fall/syncopal episode resulting in bifrontal SAH. Neurosurgery consulted.   Admitted 4/19 with cardiogenic shock in setting of AFL. Supported with inotropes and underwent DC-CV but had persistent shock. After numerous discussions about concern for adequate social support underwent placement of HM-3 LVAD on 05/15/17. D/c home 06/04/17  Here for routine f/u. Continues to do very well. Playing golf regularly without problem. Denies orthopnea or PND. No fevers, chills or problems with driveline. No bleeding, melena or neuro symptoms. No VAD alarms. Taking all meds as prescribed.     LVAD Documentation     No data to display             No data to display          Last 3 Weights Weight Weight  08/03/2023 113.944 kg   113.944 kg 251 lb 3.2 oz   251 lb 3.2 oz  05/25/2023 116.574 kg 257 lb  03/20/2023 116.756 kg 257 lb 6.4 oz     Multiple values from one day are sorted in reverse-chronological order        No data to display          Labs    Units 10/26/23 0942 10/01/23 0848 08/20/23 1005 08/03/23 0901 06/26/23 9147 05/25/23 0839 03/30/23 0904 03/20/23 0930 01/19/23 0854 01/08/23 0935 06/30/22 0903 06/16/22 1353  INR  2.2* 2.7* 2.5* 2.0*   < > 2.3*   < > 2.1*   < > 1.6*   < > 1.7*  LDH U/L  --   --   --   --   --  277*  --   --   --  306*  --  376*  HGB g/dL  --   --   --  85.7  --  14.8  --  14.8  --  14.4   < > 14.3  CREATININE mg/dL  --   --   --   --   --  1.51*  --   --   --  1.30*  --  1.77*   < > = values in this interval not  displayed.        Past Medical History:  Diagnosis Date   CHF (congestive heart failure) (HCC)    Diabetes mellitus without complication (HCC)     Current Outpatient Medications  Medication Sig Dispense Refill   amLODipine  (NORVASC ) 5 MG tablet Take 2 tablets (10 mg total) by mouth daily. 180 tablet 3   aspirin  81 MG EC tablet Take 1 tablet (81 mg total) by mouth daily. 90 tablet 3   Blood Glucose Monitoring Suppl (TRUE METRIX METER) DEVI 1 each by Does not apply route daily with breakfast. 1 Device 0   glipiZIDE  (GLUCOTROL ) 10 MG tablet TAKE (1) TABLET TWICE DAILY, BEFORE MEALS. 180 tablet 3   glucose blood (TRUE METRIX BLOOD GLUCOSE TEST) test strip Use daily before breakfast 30 each 12   nystatin  cream (MYCOSTATIN ) Apply 1 Application topically 2 (two) times daily. 30 g 0   pantoprazole  (PROTONIX ) 40 MG tablet Take 1 tablet (40 mg total)  by mouth daily. 90 tablet 3   sacubitril -valsartan  (ENTRESTO ) 49-51 MG TAKE (1) TABLET BY MOUTH TWICE DAILY. 180 tablet 3   TRUEPLUS LANCETS 28G MISC 1 each by Does not apply route daily. 30 each 12   warfarin (COUMADIN ) 6 MG tablet 3 mg (6 mg x 0.5) every Tue, Thu; 6 mg (6 mg x 1) all other days 90 tablet 3   No current facility-administered medications for this encounter.   Allergies  Allergen Reactions   Bee Venom     UNSPECIFIED REACTION    Social History   Socioeconomic History   Marital status: Single    Spouse name: Not on file   Number of children: Not on file   Years of education: Not on file   Highest education level: Not on file  Occupational History   Occupation: security  Tobacco Use   Smoking status: Former    Types: Cigars    Quit date: 11/16/2016    Years since quitting: 6.9   Smokeless tobacco: Never   Tobacco comments:    6 cigars per wk  Vaping Use   Vaping status: Never Used  Substance and Sexual Activity   Alcohol use: Yes    Alcohol/week: 4.0 standard drinks of alcohol    Types: 2 Shots of liquor, 2 Cans  of beer per week    Comment: ocasional   Drug use: No   Sexual activity: Not Currently  Other Topics Concern   Not on file  Social History Narrative   Not on file   Social Drivers of Health   Financial Resource Strain: Low Risk  (03/25/2020)   Overall Financial Resource Strain (CARDIA)    Difficulty of Paying Living Expenses: Not very hard  Food Insecurity: No Food Insecurity (03/25/2020)   Hunger Vital Sign    Worried About Running Out of Food in the Last Year: Never true    Ran Out of Food in the Last Year: Never true  Transportation Needs: No Transportation Needs (03/25/2020)   PRAPARE - Administrator, Civil Service (Medical): No    Lack of Transportation (Non-Medical): No  Physical Activity: Not on file  Stress: Not on file  Social Connections: Not on file  Intimate Partner Violence: Not on file    Family History  Problem Relation Age of Onset   CAD Father    There were no vitals filed for this visit.   Wt Readings from Last 3 Encounters:  08/03/23 113.9 kg (251 lb 3.2 oz)  05/25/23 116.6 kg (257 lb)  03/20/23 116.8 kg (257 lb 6.4 oz)       Vital Signs:     Ht: 6'                                                     Doppler Pressure: 80 Automatc BP: 91/78 (84)    HR: 76 SPO2: 97% on RA                                                         Weight: 251.2 lb w/ eqt  Last weight: 257 lb w/ ept   Physical Exam: General:  NAD.  HEENT: normal  Neck: supple. JVP not elevated.  Carotids 2+ bilat; no bruits. No lymphadenopathy or thryomegaly appreciated. Cor: LVAD hum.  Lungs: Clear. Abdomen: obese soft, nontender, non-distended. No hepatosplenomegaly. No bruits or masses. Good bowel sounds. Driveline site clean. Anchor in place.  Extremities: no cyanosis, clubbing, rash. Warm no edema  Neuro: alert & oriented x 3. No focal deficits. Moves all 4 without problem    ASSESSMENT & PLAN:  1. Chronic Combined Systolic/Diastolic Heart Failure. NICM  -  ECHO 4/19 EF 20% CMRI EF 22%  - s/p HM-3 LVAD on 05/15/17 - Stable NYHA I - Volume ok - Remains uninterested in transplant eval  2. VAD management.  - VAD interrogated personally. Parameters stable. - Remains on ASA 81 mg daily + coumadin . - INR 2.4 INR goal 2.0-2.5 Discussed warfarin dosing with PharmD personally. - LDH not drawn today - Hgb 14.1 - DL ok - MAPs ok  3. 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 - remains in NSR  4. HTN - MAPs ok   5. DMII - Sugars remain elevated. Fortunately last HgbA1c down to 7.6  - Will not tolerate SGLT2i with volume issues.  - Continue glipizide  10 bid. Unable to tolerate metformin  due to diarrhea - We have referred to Endo but has not gone in past  6. CKD 3a - Creatinine baseline 1.5-1.8 - SCr not drawn today. Will get at f/u  7. Golf game - says he needs a new set of clubs for hs Xmas. We will put in a request   I spent a total of 42 minutes today: 1) reviewing the patient's medical records including previous charts, labs and recent notes from other providers; 2) examining the patient and counseling them on their medical issues/explaining the plan of care; 3) adjusting meds as needed and 4) ordering lab work or other needed tests.   Toribio Fuel, MD 11/02/23

## 2023-11-02 NOTE — Progress Notes (Signed)
 Patient presents for 2 month follow up with 6.5 year Intermacs in VAD Clinic today alone. Reports no problems with VAD equipment or concerns with drive line.   Pt states he has been playing golf. He denies lightheadedness, dizziness, falls, heart failure symptoms, and signs of bleeding.  Reports intermittent beeping on clips. Changed clips to backup clips. Intermittent beeping stopped. Provided with loaner clips today. Will order new clips for pt.   CMET, Anemia panel, & LDH hemolyzed per lab.   Vital Signs:   Ht: 6'      Doppler Pressure: 114 Automatc BP: 114/88 (98)  HR: 85 SPO2: 98% on RA        Weight: 254.2 lb w/ eqt  Last weight: 251.2 lb w/ ept   VAD Indication: Destination Therapy - due to limited social support.    LVAD assessment:      Speed: 5350 RPM      Flow: 4.4      Power: 4.0 w       PI: 6.6  Alarms: none Events:  none Hct: 20       Fixed speed: 5300 Low speed limit: 5000  Primary Controller:  Replace back up battery in 13 months.  Back up controller: Replace back up battery in 21 months.  I reviewed the LVAD parameters from today and compared the results to the patient's prior recorded data. LVAD interrogation was STABLE for significant power changes, NEGATIVE for clinical alarms and STABLE for PI events/speed drops. No programming changes were made and pump is functioning within specified parameters.    LVAD equipment check completed and is in good working order. Back-up equipment present.   Annual maintenance completed on patient's home equipment per [facility] 03/20/23.  Exit Site Care:  Existing VAD dressing removed and site care performed using sterile technique. Drive line exit site cleaned with Chlora prep applicators x 2, allowed to dry, and silverlon patch w/ Sorbaview dressing applied. Exit site healed and incorporated, the velour is fully implanted at exit site. No drainage, redness, tenderness, foul odor noted. Rash noted under previous anchor site.  Drive line anchor repositioned and re-applied- will trial cath grip anchor this week. Pt denies fever or chills. Pt has adequate dressing supplies at home.  Device:  N/A   BP & Labs:  Doppler 114- reflecting Modified systolic  Hgb 14.1 -  No S/S of bleeding. Specifically denies melena/BRBPR or nosebleeds.   LDH hemolyzed - established baseline of 200 - 400. Denies tea-colored urine. No power elevations noted on interrogation other than above noted incidents.    Patient Instructions:  No change in medications. Return next week for dressing change - see below. Return to see Dr. Cherrie in 2 months - see below.  Isaiah Knoll RN VAD Coordinator  Office: [phone]  24/7 Pager: [phone]

## 2023-11-02 NOTE — Patient Instructions (Signed)
 No medication changes  Return to VAD Clinic in 1 week for dressing change Return to VAD Clinic in 2 months for f/u with Dr.Bensimhon

## 2023-11-02 NOTE — Addendum Note (Signed)
 Addended by: BERDINE RAKE B on: 11/02/2023 03:53 PM   Modules accepted: Orders

## 2023-11-09 ENCOUNTER — Ambulatory Visit (HOSPITAL_COMMUNITY)

## 2023-11-16 ENCOUNTER — Ambulatory Visit (HOSPITAL_COMMUNITY)
Admission: RE | Admit: 2023-11-16 | Discharge: 2023-11-16 | Disposition: A | Discharge status: Home / Self Care | Source: Ambulatory Visit | Attending: Cardiology | Admitting: Cardiology

## 2023-11-16 ENCOUNTER — Ambulatory Visit (HOSPITAL_COMMUNITY): Payer: Self-pay | Admitting: Pharmacist

## 2023-11-16 DIAGNOSIS — Z7901 Long term (current) use of anticoagulants: Secondary | ICD-10-CM | POA: Insufficient documentation

## 2023-11-16 DIAGNOSIS — Z95811 Presence of heart assist device: Secondary | ICD-10-CM | POA: Insufficient documentation

## 2023-11-16 DIAGNOSIS — I5022 Chronic systolic (congestive) heart failure: Secondary | ICD-10-CM | POA: Insufficient documentation

## 2023-11-16 LAB — PROTIME-INR
INR: 2.4 — ABNORMAL HIGH (ref 0.8–1.2)
Prothrombin Time: 27 s — ABNORMAL HIGH (ref 11.4–15.2)

## 2023-11-16 NOTE — Progress Notes (Signed)
 Patient presents to clinic today for drive line exit wound care/INR alone.   Exit Site Care: Existing VAD dressing removed and site care performed using sterile technique. Drive line exit site cleaned with Chlora prep applicators x 2, RINSED WITH SALINE, and allowed to dry. and Sorbaview dressing with Silverlon applied. Exit site healed and fully incorporated, the velour is fully implanted at exit site. No drainage, tenderness, or foul odor. Pt denies fever or chills. Pt has adequate dressing supplies at home.   Plan:  Return in 1 week for a full visit  Coumadin  by Nason PharmD   Geofm Christmas RN VAD Coordinator  Office: [phone]  24/7 Pager: [phone]

## 2023-11-23 ENCOUNTER — Other Ambulatory Visit (HOSPITAL_COMMUNITY): Payer: Self-pay | Admitting: *Deleted

## 2023-11-23 DIAGNOSIS — Z7901 Long term (current) use of anticoagulants: Secondary | ICD-10-CM

## 2023-11-23 DIAGNOSIS — Z95811 Presence of heart assist device: Secondary | ICD-10-CM

## 2023-12-10 ENCOUNTER — Other Ambulatory Visit (HOSPITAL_COMMUNITY): Payer: Self-pay | Admitting: *Deleted

## 2023-12-10 DIAGNOSIS — Z7901 Long term (current) use of anticoagulants: Secondary | ICD-10-CM

## 2023-12-10 DIAGNOSIS — Z95811 Presence of heart assist device: Secondary | ICD-10-CM

## 2023-12-11 ENCOUNTER — Ambulatory Visit (HOSPITAL_COMMUNITY)
Admission: RE | Admit: 2023-12-11 | Discharge: 2023-12-11 | Disposition: A | Discharge status: Home / Self Care | Source: Ambulatory Visit | Attending: Cardiology | Admitting: Cardiology

## 2023-12-11 ENCOUNTER — Ambulatory Visit (HOSPITAL_COMMUNITY): Payer: Self-pay | Admitting: Pharmacist

## 2023-12-11 DIAGNOSIS — Z4801 Encounter for change or removal of surgical wound dressing: Secondary | ICD-10-CM | POA: Insufficient documentation

## 2023-12-11 DIAGNOSIS — Z7901 Long term (current) use of anticoagulants: Secondary | ICD-10-CM | POA: Diagnosis not present

## 2023-12-11 DIAGNOSIS — Z95811 Presence of heart assist device: Secondary | ICD-10-CM | POA: Diagnosis present

## 2023-12-11 LAB — PROTIME-INR
INR: 2.1 — ABNORMAL HIGH (ref 0.8–1.2)
Prothrombin Time: 24.8 s — ABNORMAL HIGH (ref 11.4–15.2)

## 2023-12-11 NOTE — Progress Notes (Signed)
 Patient presents to clinic today for drive line exit wound care/INR alone.   Exit Site Care: Existing VAD dressing removed and site care performed using sterile technique. Drive line exit site cleaned with Chlora prep applicators x 2, RINSED WITH SALINE, and allowed to dry. Sorbaview dressing with Silverlon applied. Anchor reapplied. Covered with 2 large tegaderms. Exit site healed and partially incorporated, the velour is fully implanted at exit site. No drainage, tenderness, or foul odor. Pt denies fever or chills. Pt has adequate dressing supplies at home.   Plan:  Return in 1 week for dressing change Coumadin  dosing by Nason PharmD   Isaiah Knoll RN VAD Coordinator  Office: [phone]  24/7 Pager: [phone]

## 2023-12-21 ENCOUNTER — Ambulatory Visit (HOSPITAL_COMMUNITY)

## 2023-12-27 ENCOUNTER — Other Ambulatory Visit (HOSPITAL_COMMUNITY): Payer: Self-pay | Admitting: Internal Medicine

## 2023-12-27 ENCOUNTER — Other Ambulatory Visit (HOSPITAL_COMMUNITY): Payer: Self-pay | Admitting: Unknown Physician Specialty

## 2023-12-27 DIAGNOSIS — Z95811 Presence of heart assist device: Secondary | ICD-10-CM

## 2023-12-27 DIAGNOSIS — I5022 Chronic systolic (congestive) heart failure: Secondary | ICD-10-CM

## 2023-12-27 DIAGNOSIS — Z7901 Long term (current) use of anticoagulants: Secondary | ICD-10-CM

## 2023-12-28 ENCOUNTER — Ambulatory Visit (HOSPITAL_COMMUNITY): Payer: Self-pay | Admitting: Pharmacist

## 2023-12-28 ENCOUNTER — Ambulatory Visit (HOSPITAL_COMMUNITY)
Admission: RE | Admit: 2023-12-28 | Discharge: 2023-12-28 | Disposition: A | Discharge status: Home / Self Care | Source: Ambulatory Visit | Attending: Internal Medicine | Admitting: Internal Medicine

## 2023-12-28 VITALS — BP 109/86 | HR 84

## 2023-12-28 DIAGNOSIS — Z8679 Personal history of other diseases of the circulatory system: Secondary | ICD-10-CM | POA: Insufficient documentation

## 2023-12-28 DIAGNOSIS — Z87891 Personal history of nicotine dependence: Secondary | ICD-10-CM | POA: Diagnosis not present

## 2023-12-28 DIAGNOSIS — N182 Chronic kidney disease, stage 2 (mild): Secondary | ICD-10-CM | POA: Insufficient documentation

## 2023-12-28 DIAGNOSIS — Z7982 Long term (current) use of aspirin: Secondary | ICD-10-CM | POA: Insufficient documentation

## 2023-12-28 DIAGNOSIS — I5042 Chronic combined systolic (congestive) and diastolic (congestive) heart failure: Secondary | ICD-10-CM | POA: Diagnosis present

## 2023-12-28 DIAGNOSIS — I10 Essential (primary) hypertension: Secondary | ICD-10-CM | POA: Diagnosis not present

## 2023-12-28 DIAGNOSIS — E1122 Type 2 diabetes mellitus with diabetic chronic kidney disease: Secondary | ICD-10-CM | POA: Diagnosis not present

## 2023-12-28 DIAGNOSIS — Z7984 Long term (current) use of oral hypoglycemic drugs: Secondary | ICD-10-CM | POA: Diagnosis not present

## 2023-12-28 DIAGNOSIS — Z7901 Long term (current) use of anticoagulants: Secondary | ICD-10-CM

## 2023-12-28 DIAGNOSIS — I428 Other cardiomyopathies: Secondary | ICD-10-CM | POA: Diagnosis not present

## 2023-12-28 DIAGNOSIS — Z79899 Other long term (current) drug therapy: Secondary | ICD-10-CM | POA: Diagnosis not present

## 2023-12-28 DIAGNOSIS — R579 Shock, unspecified: Secondary | ICD-10-CM | POA: Diagnosis not present

## 2023-12-28 DIAGNOSIS — N1831 Chronic kidney disease, stage 3a: Secondary | ICD-10-CM | POA: Diagnosis not present

## 2023-12-28 DIAGNOSIS — I130 Hypertensive heart and chronic kidney disease with heart failure and stage 1 through stage 4 chronic kidney disease, or unspecified chronic kidney disease: Secondary | ICD-10-CM | POA: Diagnosis present

## 2023-12-28 DIAGNOSIS — I609 Nontraumatic subarachnoid hemorrhage, unspecified: Secondary | ICD-10-CM | POA: Insufficient documentation

## 2023-12-28 DIAGNOSIS — E039 Hypothyroidism, unspecified: Secondary | ICD-10-CM | POA: Insufficient documentation

## 2023-12-28 DIAGNOSIS — Z95811 Presence of heart assist device: Secondary | ICD-10-CM

## 2023-12-28 DIAGNOSIS — R570 Cardiogenic shock: Secondary | ICD-10-CM | POA: Diagnosis not present

## 2023-12-28 DIAGNOSIS — I5022 Chronic systolic (congestive) heart failure: Secondary | ICD-10-CM | POA: Diagnosis not present

## 2023-12-28 DIAGNOSIS — I480 Paroxysmal atrial fibrillation: Secondary | ICD-10-CM

## 2023-12-28 LAB — CBC
HCT: 43 % (ref 39.0–52.0)
Hemoglobin: 14.2 g/dL (ref 13.0–17.0)
MCH: 27.6 pg (ref 26.0–34.0)
MCHC: 33 g/dL (ref 30.0–36.0)
MCV: 83.7 fL (ref 80.0–100.0)
Platelets: 245 K/uL (ref 150–400)
RBC: 5.14 MIL/uL (ref 4.22–5.81)
RDW: 15.7 % — ABNORMAL HIGH (ref 11.5–15.5)
WBC: 6.3 K/uL (ref 4.0–10.5)
nRBC: 0 % (ref 0.0–0.2)

## 2023-12-28 LAB — PROTIME-INR
INR: 2.5 — ABNORMAL HIGH (ref 0.8–1.2)
Prothrombin Time: 28 s — ABNORMAL HIGH (ref 11.4–15.2)

## 2023-12-28 NOTE — Progress Notes (Addendum)
 Patient presents for 2 month follow up with 6.5 year Intermacs in VAD Clinic today alone. Reports no problems with VAD equipment or concerns with drive line.   Pt states he is done playing golf for the winter. He plans to start walking somewhere indoors for exercise. He denies lightheadedness, dizziness, falls, heart failure symptoms, and signs of bleeding.  Unable to download pump history on Heartmate Touch or older screen. Pump running light very faint on controller. Buttons on controller have delayed response to being pressed. Decision was made to change out controller. Decision was made to exchange controller. HeartMate 3 controller exchange performed at the bedside to [id number] Manufacture: 11/21/23 Exp: 09/17/25. We discussed that they should never attempt to do this at home without notifying VAD Pager first to triage need and provide support for the patient's safety as they may incur difficulties with the procedure and impair ability to restart pump. Performed elective controller exchange with VAD coordinator supervision. Patient tolerated controller exchange well and was asymptomatic. Pump restarted as expected with stable VAD parameters on correct prescribed speed of 5300 / 5000 rpms.    BMET and LDH hemolyzed today.   Vital Signs:   Ht: 6'      Doppler Pressure: 115 Automatc BP: 109/86 (95)  HR: 84 SPO2: 98% on RA        Weight: 261 lb w/ eqt  Last weight: 254.1 lb w/ ept   VAD Indication: Destination Therapy - due to limited social support.    LVAD assessment:      Speed: 5300 RPM      Flow: 4.1      Power: 4.0 w       PI: 6.2  Alarms: none Events:  unable to download- controller exchanged- see note above Hct: 20       Fixed speed: 5300 Low speed limit: 5000  Primary Controller:  BUB exchanged with controller change out: [id number] Manufacture: 10/18/23 Expiration: 09/17/25. Replace back up battery in 34 months.  Back up controller: Replace back up battery in 21 months.  I  reviewed the LVAD parameters from today and compared the results to the patient's prior recorded data. LVAD interrogation was STABLE for significant power changes, NEGATIVE for clinical alarms and STABLE for PI events/speed drops. No programming changes were made and pump is functioning within specified parameters.    LVAD equipment check completed and is in good working order. Back-up equipment present.   Annual maintenance completed on patient's home equipment per [facility] 03/20/23.  Exit Site Care:  Existing VAD dressing removed and site care performed using sterile technique. Drive line exit site cleaned with Chlora prep applicators x 2, allowed to dry, and silverlon patch w/ Sorbaview dressing applied. Exit site healed and incorporated, the velour is fully implanted at exit site. No drainage, redness, tenderness, foul odor noted. Rash noted under previous anchor site. Drive line anchor repositioned and re-applied- will trial cath grip anchor this week. Pt denies fever or chills. Pt has adequate dressing supplies at home.  Device:  N/A   BP & Labs:  Doppler 115- reflecting Modified systolic  Hgb 14.2 -  No S/S of bleeding. Specifically denies melena/BRBPR or nosebleeds.   LDH specimen hemolyzed- established baseline of 200 - 400. Denies tea-colored urine. No power elevations noted on interrogation other than above noted incidents.    Patient Instructions:  No medication changes Coumadin  dosing per Nason PharmD Return to clinic next Friday for dressing change & INR Return to clinic in 2  months for follow up appt with Dr Cherrie  Isaiah Knoll RN VAD Coordinator  Office: [phone]  24/7 Pager: [phone]

## 2023-12-28 NOTE — Progress Notes (Signed)
 LVAD Clinic Note   Primary Cardiologist: Dr. Cherrie   HPI: Benjamin Sherman is a 53 y.o. 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 ECHO showed severely reduced EF. 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 Placed on steroids without benefit.  Admitted 11/18 after fall/syncopal episode resulting in bifrontal SAH. Neurosurgery consulted.   Admitted 4/19 with cardiogenic shock in setting of AFL. Supported with inotropes and underwent DC-CV but had persistent shock. After numerous discussions about concern for adequate social support underwent placement of HM-3 LVAD on 05/15/17. D/c home 06/04/17  Here for routine f/u. Doing very well. No SOB or edema. Denies orthopnea or PND. No fevers, chills or problems with driveline. No bleeding, melena or neuro symptoms. No VAD alarms. Taking all meds as prescribed.    LVAD Documentation    12/28/2023  Device Info  LVAD Type: Heartmate III  Date of Implant: 04/18/2017  Therapy Type: Destination Therapy      12/28/2023  Vitals  Heart Rate: 84 BPM  Automatic BP: 109/86  Doppler MAP: 115 mmHg  SpO2: 98 %    Last 3 Weights Weight Weight  11/02/2023 115.304 kg 254 lb 3.2 oz  08/03/2023 113.944 kg   113.944 kg 251 lb 3.2 oz   251 lb 3.2 oz  05/25/2023 116.574 kg 257 lb     Multiple values from one day are sorted in reverse-chronological order       12/28/2023  LVAD Paramaters  Speed: 5300 RPM  Flow: 4 LPM  PI: 6  Power: 4 Watts  Hematocrit: 20 %  Alarms: none  Events: unable to download history- controller changed out  Last Speed Change Date: 05/25/2017  Last Ramp Echo Date: 05/25/2017  Last Right Heart Cath Date: 11/10/2016  Bleeding History: No  Type of Dressing: Weekly  Annual Maintenance Date: 03/20/2023    Labs    Units 12/28/23 1115 12/11/23 0847  11/16/23 0909 11/02/23 1034 08/20/23 1005 08/03/23 0901 06/26/23 0852 05/25/23 0839 01/19/23 0854 01/08/23 0935  INR  2.5* 2.1* 2.4* 2.4*   < > 2.0*   < > 2.3*   < > 1.6*  LDH U/L  --   --   --   --   --   --   --  277*  --  306*  HGB g/dL 85.7  --   --  85.8  --  14.2  --  14.8   < > 14.4  CREATININE mg/dL  --   --   --   --   --   --   --  1.51*  --  1.30*   < > = values in this interval not displayed.        Past Medical History:  Diagnosis Date   CHF (congestive heart failure) (HCC)    Diabetes mellitus without complication (HCC)     Current Outpatient Medications  Medication Sig Dispense Refill   amLODipine  (NORVASC ) 5 MG tablet Take 2 tablets (10 mg total) by mouth daily. 180 tablet 3   aspirin  81 MG EC tablet Take 1 tablet (81 mg total) by mouth daily. 90 tablet 3   glipiZIDE  (GLUCOTROL ) 10 MG tablet TAKE (1) TABLET TWICE DAILY, BEFORE MEALS. 180 tablet 3   nystatin  cream (MYCOSTATIN ) Apply 1 Application topically 2 (two) times daily. 30 g 0   pantoprazole  (PROTONIX ) 40 MG  tablet Take 1 tablet (40 mg total) by mouth daily. 90 tablet 3   sacubitril -valsartan  (ENTRESTO ) 49-51 MG TAKE (1) TABLET BY MOUTH TWICE DAILY. 180 tablet 3   warfarin (COUMADIN ) 6 MG tablet 3 mg (6 mg x 0.5) every Tue, Thu; 6 mg (6 mg x 1) all other days 90 tablet 3   Blood Glucose Monitoring Suppl (TRUE METRIX METER) DEVI 1 each by Does not apply route daily with breakfast. (Patient not taking: Reported on 12/28/2023) 1 Device 0   glucose blood (TRUE METRIX BLOOD GLUCOSE TEST) test strip Use daily before breakfast (Patient not taking: Reported on 12/28/2023) 30 each 12   TRUEPLUS LANCETS 28G MISC 1 each by Does not apply route daily. (Patient not taking: Reported on 12/28/2023) 30 each 12   No current facility-administered medications for this encounter.   Allergies  Allergen Reactions   Bee Venom     UNSPECIFIED REACTION    Social History   Socioeconomic History   Marital status: Single     Spouse name: Not on file   Number of children: Not on file   Years of education: Not on file   Highest education level: Not on file  Occupational History   Occupation: security  Tobacco Use   Smoking status: Former    Types: Cigars    Quit date: 11/16/2016    Years since quitting: 7.1   Smokeless tobacco: Never   Tobacco comments:    6 cigars per wk  Vaping Use   Vaping status: Never Used  Substance and Sexual Activity   Alcohol use: Yes    Alcohol/week: 4.0 standard drinks of alcohol    Types: 2 Shots of liquor, 2 Cans of beer per week    Comment: ocasional   Drug use: No   Sexual activity: Not Currently  Other Topics Concern   Not on file  Social History Narrative   Not on file   Social Drivers of Health   Tobacco Use: Medium Risk (05/25/2023)   Patient History    Smoking Tobacco Use: Former    Smokeless Tobacco Use: Never    Passive Exposure: Not on Actuary Strain: Not on file  Food Insecurity: Not on file  Transportation Needs: Not on file  Physical Activity: Not on file  Stress: Not on file  Social Connections: Not on file  Intimate Partner Violence: Not on file  Depression (PHQ2-9): Not on file  Alcohol Screen: Not on file  Housing: Not on file  Utilities: Not on file  Health Literacy: Not on file    Family History  Problem Relation Age of Onset   CAD Father    Vitals:   12/28/23 1000 12/28/23 1042  BP: (!) 115/0 109/86  Pulse: 84   SpO2: 98%      Wt Readings from Last 3 Encounters:  11/02/23 115.3 kg (254 lb 3.2 oz)  08/03/23 113.9 kg (251 lb 3.2 oz)  05/25/23 116.6 kg (257 lb)    Physical Exam: General:  NAD.  HEENT: normal  Neck: supple. JVP not elevated.  Carotids 2+ bilat; no bruits. No lymphadenopathy or thryomegaly appreciated. Cor: LVAD hum.  Lungs: Clear. Abdomen: obese soft, nontender, non-distended. No hepatosplenomegaly. No bruits or masses. Good bowel sounds. Driveline site clean. Anchor in place.  Extremities:  no cyanosis, clubbing, rash. Warm no edema  Neuro: alert & oriented x 3. No focal deficits. Moves all 4 without problem   ASSESSMENT & PLAN:  1. Chronic Combined Systolic/Diastolic Heart  Failure. NICM  - ECHO 4/19 EF 20% CMRI EF 22%  - s/p HM-3 LVAD on 05/15/17 - Stable NYHA I  - Volume ok - Remains uninterested in transplant eval  2. VAD management.  - VAD interrogated personally. Parameters stable. - Remains on ASA 81 mg daily + coumadin . - INR 2.5 INR goal 2.0-2.5 Discussed warfarin dosing with PharmD personally. 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 On warfarin  4. HTN - MAPs ok  5. DMII - Sugars remain elevated. Fortunately last HgbA1c down to 7.6  - Will not tolerate SGLT2i with volume issues.  - Continue glipizide  10 bid. Unable to tolerate metformin  due to diarrhea - We have referred to Endo but has not gone in past  6. CKD 3a - Creatinine baseline 1.5-1.8 - SCr not drawn today. Will get at f/u  7. Golf game - says he needs a new set of clubs for hs Xmas. We will [facility] in a request with [facility]  I spent a total of 38 minutes today: 1) reviewing the patient's medical records including previous charts, labs and recent notes from other providers; 2) examining the patient and counseling them on their medical issues/explaining the plan of care; 3) adjusting meds as needed and 4) ordering lab work or other needed tests.   Toribio Fuel, MD 12/28/2023

## 2023-12-28 NOTE — Patient Instructions (Signed)
 No medication changes Coumadin  dosing per Nason PharmD Return to clinic next Friday for dressing change & INR Return to clinic in 2 months for follow up appt with Dr Bensimhon

## 2023-12-28 NOTE — Addendum Note (Signed)
 Encounter addended by: Berdine Isaiah NOVAK, RN on: 12/28/2023 3:46 PM  Actions taken: Clinical Note Signed, Charge Capture section accepted

## 2024-01-01 ENCOUNTER — Other Ambulatory Visit (HOSPITAL_COMMUNITY): Payer: Self-pay | Admitting: *Deleted

## 2024-01-01 DIAGNOSIS — I5022 Chronic systolic (congestive) heart failure: Secondary | ICD-10-CM

## 2024-01-01 DIAGNOSIS — Z7901 Long term (current) use of anticoagulants: Secondary | ICD-10-CM

## 2024-01-01 DIAGNOSIS — Z95811 Presence of heart assist device: Secondary | ICD-10-CM

## 2024-01-04 ENCOUNTER — Ambulatory Visit (HOSPITAL_COMMUNITY)

## 2024-01-06 ENCOUNTER — Other Ambulatory Visit (HOSPITAL_COMMUNITY): Payer: Self-pay | Admitting: Internal Medicine

## 2024-01-06 DIAGNOSIS — I5022 Chronic systolic (congestive) heart failure: Secondary | ICD-10-CM

## 2024-01-06 DIAGNOSIS — I10 Essential (primary) hypertension: Secondary | ICD-10-CM

## 2024-01-07 ENCOUNTER — Other Ambulatory Visit (HOSPITAL_COMMUNITY): Payer: Self-pay | Admitting: Internal Medicine

## 2024-01-07 DIAGNOSIS — Z7901 Long term (current) use of anticoagulants: Secondary | ICD-10-CM

## 2024-01-07 DIAGNOSIS — Z95811 Presence of heart assist device: Secondary | ICD-10-CM

## 2024-01-08 ENCOUNTER — Other Ambulatory Visit (HOSPITAL_COMMUNITY): Payer: Self-pay

## 2024-01-10 ENCOUNTER — Other Ambulatory Visit (HOSPITAL_COMMUNITY): Payer: Self-pay | Admitting: Internal Medicine

## 2024-01-10 DIAGNOSIS — N183 Chronic kidney disease, stage 3 unspecified: Secondary | ICD-10-CM

## 2024-01-10 DIAGNOSIS — I5022 Chronic systolic (congestive) heart failure: Secondary | ICD-10-CM

## 2024-01-10 DIAGNOSIS — Z7901 Long term (current) use of anticoagulants: Secondary | ICD-10-CM

## 2024-01-14 ENCOUNTER — Ambulatory Visit (HOSPITAL_COMMUNITY)
Admission: RE | Admit: 2024-01-14 | Discharge: 2024-01-14 | Disposition: A | Source: Ambulatory Visit | Attending: Cardiology

## 2024-01-14 ENCOUNTER — Ambulatory Visit (HOSPITAL_COMMUNITY): Payer: Self-pay | Admitting: Pharmacist

## 2024-01-14 DIAGNOSIS — I5022 Chronic systolic (congestive) heart failure: Secondary | ICD-10-CM | POA: Diagnosis not present

## 2024-01-14 DIAGNOSIS — Z4801 Encounter for change or removal of surgical wound dressing: Secondary | ICD-10-CM | POA: Insufficient documentation

## 2024-01-14 DIAGNOSIS — Z7901 Long term (current) use of anticoagulants: Secondary | ICD-10-CM | POA: Insufficient documentation

## 2024-01-14 DIAGNOSIS — Z95811 Presence of heart assist device: Secondary | ICD-10-CM | POA: Insufficient documentation

## 2024-01-14 LAB — PROTIME-INR
INR: 1.8 — ABNORMAL HIGH (ref 0.8–1.2)
Prothrombin Time: 22 s — ABNORMAL HIGH (ref 11.4–15.2)

## 2024-01-14 NOTE — Progress Notes (Signed)
 Patient presents to clinic today for drive line exit wound care & BMET/INR alone.   Exit Site Care: Existing VAD dressing removed and site care performed using sterile technique. Drive line exit site cleaned with Chlora prep applicators x 2, RINSED WITH SALINE, and allowed to dry. Sorbaview dressing with Silverlon applied. Anchor reapplied. Covered with 2 large tegaderms. Exit site healed and partially incorporated, the velour is fully implanted at exit site. No drainage, tenderness, or foul odor. Pt denies fever or chills. Pt has adequate dressing supplies at home.   Plan:  Return in 1 week for dressing change Coumadin  dosing by Nason PharmD  Schuyler Lunger RN, BSN VAD Coordinator 24/7 Pager [phone]

## 2024-01-18 ENCOUNTER — Other Ambulatory Visit (HOSPITAL_COMMUNITY): Payer: Self-pay

## 2024-01-18 DIAGNOSIS — Z7901 Long term (current) use of anticoagulants: Secondary | ICD-10-CM

## 2024-01-18 DIAGNOSIS — Z95811 Presence of heart assist device: Secondary | ICD-10-CM

## 2024-01-25 ENCOUNTER — Ambulatory Visit (HOSPITAL_COMMUNITY): Payer: Self-pay | Admitting: Pharmacist

## 2024-01-25 ENCOUNTER — Ambulatory Visit (HOSPITAL_COMMUNITY)
Admission: RE | Admit: 2024-01-25 | Discharge: 2024-01-25 | Disposition: A | Source: Ambulatory Visit | Attending: Internal Medicine

## 2024-01-25 DIAGNOSIS — Z4801 Encounter for change or removal of surgical wound dressing: Secondary | ICD-10-CM | POA: Diagnosis present

## 2024-01-25 DIAGNOSIS — Z7901 Long term (current) use of anticoagulants: Secondary | ICD-10-CM | POA: Insufficient documentation

## 2024-01-25 DIAGNOSIS — Z95811 Presence of heart assist device: Secondary | ICD-10-CM | POA: Diagnosis not present

## 2024-01-25 LAB — PROTIME-INR
INR: 2.3 — ABNORMAL HIGH (ref 0.8–1.2)
Prothrombin Time: 26.5 s — ABNORMAL HIGH (ref 11.4–15.2)

## 2024-01-25 LAB — BASIC METABOLIC PANEL WITH GFR
Anion gap: 10 (ref 5–15)
BUN: 23 mg/dL — ABNORMAL HIGH (ref 6–20)
CO2: 26 mmol/L (ref 22–32)
Calcium: 9.3 mg/dL (ref 8.9–10.3)
Chloride: 95 mmol/L — ABNORMAL LOW (ref 98–111)
Creatinine, Ser: 1.52 mg/dL — ABNORMAL HIGH (ref 0.61–1.24)
GFR, Estimated: 54 mL/min — ABNORMAL LOW
Glucose, Bld: 289 mg/dL — ABNORMAL HIGH (ref 70–99)
Potassium: 4.8 mmol/L (ref 3.5–5.1)
Sodium: 131 mmol/L — ABNORMAL LOW (ref 135–145)

## 2024-01-25 NOTE — Progress Notes (Signed)
 Patient presents to clinic today for drive line exit wound care & BMET/INR alone.   Exit Site Care: Existing VAD dressing removed and site care performed using sterile technique. Drive line exit site cleaned with Chlora prep applicators x 2, RINSED WITH SALINE, and allowed to dry. Sorbaview dressing with Silverlon applied. Anchor reapplied. Covered with 2 large tegaderms. Exit site healed and partially incorporated, the velour is fully implanted at exit site. No drainage, tenderness, or foul odor. Pt denies fever or chills. Pt has adequate dressing supplies at home.   Plan:  Return in 1 week for dressing change Coumadin  dosing by Lauren PharmD  Geofm Christmas RN, BSN VAD Coordinator 24/7 Pager [phone]

## 2024-02-01 ENCOUNTER — Telehealth (HOSPITAL_COMMUNITY): Payer: Self-pay

## 2024-02-01 ENCOUNTER — Ambulatory Visit (HOSPITAL_COMMUNITY)

## 2024-02-01 NOTE — Telephone Encounter (Signed)
 Pt left VM requesting need to cancel dressing chang appointment Appointment cancelled . Attempted to call to reschedule but VM full.   Schuyler Lunger RN, BSN VAD Coordinator 24/7 Pager [phone]

## 2024-02-06 ENCOUNTER — Other Ambulatory Visit (HOSPITAL_COMMUNITY): Payer: Self-pay

## 2024-02-06 DIAGNOSIS — Z95811 Presence of heart assist device: Secondary | ICD-10-CM

## 2024-02-06 DIAGNOSIS — Z7901 Long term (current) use of anticoagulants: Secondary | ICD-10-CM

## 2024-02-08 ENCOUNTER — Telehealth: Payer: Self-pay

## 2024-02-08 ENCOUNTER — Ambulatory Visit (HOSPITAL_COMMUNITY)
Admission: RE | Admit: 2024-02-08 | Discharge: 2024-02-08 | Disposition: A | Source: Ambulatory Visit | Attending: Cardiology

## 2024-02-08 DIAGNOSIS — Z95811 Presence of heart assist device: Secondary | ICD-10-CM | POA: Insufficient documentation

## 2024-02-08 DIAGNOSIS — Z4509 Encounter for adjustment and management of other cardiac device: Secondary | ICD-10-CM | POA: Insufficient documentation
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
## 2024-02-15 ENCOUNTER — Ambulatory Visit (HOSPITAL_COMMUNITY)

## 2024-02-20 ENCOUNTER — Other Ambulatory Visit (HOSPITAL_COMMUNITY): Payer: Self-pay

## 2024-02-22 ENCOUNTER — Ambulatory Visit (HOSPITAL_COMMUNITY)

## 2024-02-29 ENCOUNTER — Ambulatory Visit (HOSPITAL_COMMUNITY)

## 2024-03-07 ENCOUNTER — Ambulatory Visit (HOSPITAL_COMMUNITY): Admitting: Internal Medicine
